# Patient Record
Sex: Female | Born: 1935 | Race: White | Hispanic: No | Smoking: Never smoker
Health system: Southern US, Community
[De-identification: ages and names within clinical notes are randomized; demographics above are authoritative.]

## PROBLEM LIST (undated history)

## (undated) DIAGNOSIS — Z8489 Family history of other specified conditions: Secondary | ICD-10-CM

## (undated) DIAGNOSIS — E7800 Pure hypercholesterolemia, unspecified: Secondary | ICD-10-CM

## (undated) DIAGNOSIS — Z8669 Personal history of other diseases of the nervous system and sense organs: Secondary | ICD-10-CM

## (undated) DIAGNOSIS — IMO0002 Reserved for concepts with insufficient information to code with codable children: Secondary | ICD-10-CM

## (undated) DIAGNOSIS — C801 Malignant (primary) neoplasm, unspecified: Secondary | ICD-10-CM

## (undated) DIAGNOSIS — C3490 Malignant neoplasm of unspecified part of unspecified bronchus or lung: Secondary | ICD-10-CM

## (undated) DIAGNOSIS — Z828 Family history of other disabilities and chronic diseases leading to disablement, not elsewhere classified: Secondary | ICD-10-CM

## (undated) DIAGNOSIS — J700 Acute pulmonary manifestations due to radiation: Secondary | ICD-10-CM

## (undated) DIAGNOSIS — I4891 Unspecified atrial fibrillation: Secondary | ICD-10-CM

## (undated) DIAGNOSIS — IMO0001 Reserved for inherently not codable concepts without codable children: Secondary | ICD-10-CM

## (undated) HISTORY — DX: Pure hypercholesterolemia, unspecified: E78.00

## (undated) HISTORY — DX: Reserved for concepts with insufficient information to code with codable children: IMO0002

## (undated) HISTORY — DX: Acute pulmonary manifestations due to radiation: J70.0

## (undated) HISTORY — DX: Personal history of other diseases of the nervous system and sense organs: Z86.69

## (undated) HISTORY — DX: Reserved for inherently not codable concepts without codable children: IMO0001

## (undated) HISTORY — DX: Malignant neoplasm of unspecified part of unspecified bronchus or lung: C34.90

---

## 1998-01-24 ENCOUNTER — Other Ambulatory Visit: Admission: RE | Admit: 1998-01-24 | Discharge: 1998-01-24 | Payer: Self-pay | Admitting: Gynecology

## 1998-09-02 ENCOUNTER — Emergency Department (HOSPITAL_COMMUNITY): Admission: EM | Admit: 1998-09-02 | Discharge: 1998-09-02 | Payer: Self-pay | Admitting: Emergency Medicine

## 1999-01-17 ENCOUNTER — Other Ambulatory Visit: Admission: RE | Admit: 1999-01-17 | Discharge: 1999-01-17 | Payer: Self-pay | Admitting: Gynecology

## 1999-07-05 ENCOUNTER — Encounter: Payer: Self-pay | Admitting: Gynecology

## 1999-07-05 ENCOUNTER — Encounter: Admission: RE | Admit: 1999-07-05 | Discharge: 1999-07-05 | Payer: Self-pay | Admitting: Gynecology

## 1999-08-15 DIAGNOSIS — C3490 Malignant neoplasm of unspecified part of unspecified bronchus or lung: Secondary | ICD-10-CM

## 1999-08-15 HISTORY — DX: Malignant neoplasm of unspecified part of unspecified bronchus or lung: C34.90

## 2000-01-08 ENCOUNTER — Other Ambulatory Visit: Admission: RE | Admit: 2000-01-08 | Discharge: 2000-01-08 | Payer: Self-pay | Admitting: Gynecology

## 2000-07-08 ENCOUNTER — Encounter: Admission: RE | Admit: 2000-07-08 | Discharge: 2000-07-08 | Payer: Self-pay | Admitting: Gynecology

## 2000-07-08 ENCOUNTER — Encounter: Payer: Self-pay | Admitting: Gynecology

## 2001-01-08 ENCOUNTER — Other Ambulatory Visit: Admission: RE | Admit: 2001-01-08 | Discharge: 2001-01-08 | Payer: Self-pay | Admitting: Gynecology

## 2001-07-06 ENCOUNTER — Encounter: Payer: Self-pay | Admitting: Gynecology

## 2001-07-06 ENCOUNTER — Encounter: Admission: RE | Admit: 2001-07-06 | Discharge: 2001-07-06 | Payer: Self-pay | Admitting: Gynecology

## 2002-07-07 ENCOUNTER — Ambulatory Visit (HOSPITAL_COMMUNITY): Admission: RE | Admit: 2002-07-07 | Discharge: 2002-07-07 | Payer: Self-pay | Admitting: Family Medicine

## 2002-07-07 ENCOUNTER — Encounter: Payer: Self-pay | Admitting: Family Medicine

## 2003-03-10 ENCOUNTER — Other Ambulatory Visit: Admission: RE | Admit: 2003-03-10 | Discharge: 2003-03-10 | Payer: Self-pay | Admitting: Family Medicine

## 2003-07-12 ENCOUNTER — Ambulatory Visit (HOSPITAL_COMMUNITY): Admission: RE | Admit: 2003-07-12 | Discharge: 2003-07-12 | Payer: Self-pay | Admitting: Family Medicine

## 2004-07-12 ENCOUNTER — Ambulatory Visit (HOSPITAL_COMMUNITY): Admission: RE | Admit: 2004-07-12 | Discharge: 2004-07-12 | Payer: Self-pay | Admitting: Family Medicine

## 2005-07-15 ENCOUNTER — Ambulatory Visit (HOSPITAL_COMMUNITY): Admission: RE | Admit: 2005-07-15 | Discharge: 2005-07-15 | Payer: Self-pay | Admitting: Family Medicine

## 2006-06-13 ENCOUNTER — Ambulatory Visit (HOSPITAL_COMMUNITY): Admission: RE | Admit: 2006-06-13 | Discharge: 2006-06-13 | Payer: Self-pay | Admitting: Thoracic Surgery

## 2006-06-19 ENCOUNTER — Inpatient Hospital Stay (HOSPITAL_COMMUNITY): Admission: RE | Admit: 2006-06-19 | Discharge: 2006-06-25 | Payer: Self-pay | Admitting: Thoracic Surgery

## 2006-06-19 ENCOUNTER — Encounter (INDEPENDENT_AMBULATORY_CARE_PROVIDER_SITE_OTHER): Payer: Self-pay | Admitting: Specialist

## 2006-06-19 HISTORY — PX: OTHER SURGICAL HISTORY: SHX169

## 2006-06-24 ENCOUNTER — Ambulatory Visit: Payer: Self-pay | Admitting: Internal Medicine

## 2006-07-02 ENCOUNTER — Encounter: Admission: RE | Admit: 2006-07-02 | Discharge: 2006-07-02 | Payer: Self-pay | Admitting: Thoracic Surgery

## 2006-07-08 LAB — COMPREHENSIVE METABOLIC PANEL
AST: 14 U/L (ref 0–37)
BUN: 17 mg/dL (ref 6–23)
CO2: 23 mEq/L (ref 19–32)
Calcium: 9.3 mg/dL (ref 8.4–10.5)
Chloride: 105 mEq/L (ref 96–112)
Creatinine, Ser: 0.72 mg/dL (ref 0.40–1.20)
Total Bilirubin: 0.3 mg/dL (ref 0.3–1.2)

## 2006-07-08 LAB — CBC WITH DIFFERENTIAL/PLATELET
Basophils Absolute: 0 10*3/uL (ref 0.0–0.1)
EOS%: 0.5 % (ref 0.0–7.0)
HCT: 37.4 % (ref 34.8–46.6)
HGB: 12.8 g/dL (ref 11.6–15.9)
LYMPH%: 21.8 % (ref 14.0–48.0)
MCH: 31.9 pg (ref 26.0–34.0)
NEUT%: 67.8 % (ref 39.6–76.8)
Platelets: 432 10*3/uL — ABNORMAL HIGH (ref 145–400)
lymph#: 1.7 10*3/uL (ref 0.9–3.3)

## 2006-07-16 ENCOUNTER — Ambulatory Visit: Payer: Self-pay | Admitting: Thoracic Surgery

## 2006-07-16 ENCOUNTER — Encounter: Admission: RE | Admit: 2006-07-16 | Discharge: 2006-07-16 | Payer: Self-pay | Admitting: Thoracic Surgery

## 2006-07-29 LAB — COMPREHENSIVE METABOLIC PANEL
ALT: 31 U/L (ref 0–35)
BUN: 13 mg/dL (ref 6–23)
CO2: 17 mEq/L — ABNORMAL LOW (ref 19–32)
Calcium: 9.2 mg/dL (ref 8.4–10.5)
Chloride: 105 mEq/L (ref 96–112)
Creatinine, Ser: 0.62 mg/dL (ref 0.40–1.20)
Glucose, Bld: 165 mg/dL — ABNORMAL HIGH (ref 70–99)

## 2006-07-29 LAB — CBC WITH DIFFERENTIAL/PLATELET
Basophils Absolute: 0 10*3/uL (ref 0.0–0.1)
HCT: 40 % (ref 34.8–46.6)
HGB: 13.7 g/dL (ref 11.6–15.9)
MONO#: 0.1 10*3/uL (ref 0.1–0.9)
NEUT%: 87.2 % — ABNORMAL HIGH (ref 39.6–76.8)
WBC: 7.4 10*3/uL (ref 3.9–10.0)
lymph#: 0.8 10*3/uL — ABNORMAL LOW (ref 0.9–3.3)

## 2006-08-06 LAB — CBC WITH DIFFERENTIAL/PLATELET
BASO%: 0.2 % (ref 0.0–2.0)
Basophils Absolute: 0 10*3/uL (ref 0.0–0.1)
EOS%: 0.1 % (ref 0.0–7.0)
Eosinophils Absolute: 0 10*3/uL (ref 0.0–0.5)
HCT: 36.5 % (ref 34.8–46.6)
HGB: 12.5 g/dL (ref 11.6–15.9)
LYMPH%: 21.8 % (ref 14.0–48.0)
MCH: 31.3 pg (ref 26.0–34.0)
MCHC: 34.2 g/dL (ref 32.0–36.0)
MCV: 91.5 fL (ref 81.0–101.0)
MONO#: 0.3 10*3/uL (ref 0.1–0.9)
MONO%: 2.8 % (ref 0.0–13.0)
NEUT#: 7.6 10*3/uL — ABNORMAL HIGH (ref 1.5–6.5)
NEUT%: 75.1 % (ref 39.6–76.8)
Platelets: 187 10*3/uL (ref 145–400)
RBC: 3.99 10*6/uL (ref 3.70–5.32)
RDW: 13.2 % (ref 11.3–14.5)
WBC: 10.1 10*3/uL — ABNORMAL HIGH (ref 3.9–10.0)
lymph#: 2.2 10*3/uL (ref 0.9–3.3)

## 2006-08-06 LAB — COMPREHENSIVE METABOLIC PANEL
ALT: 12 U/L (ref 0–35)
Alkaline Phosphatase: 81 U/L (ref 39–117)
CO2: 26 mEq/L (ref 19–32)
Creatinine, Ser: 0.7 mg/dL (ref 0.40–1.20)
Glucose, Bld: 107 mg/dL — ABNORMAL HIGH (ref 70–99)
Sodium: 140 mEq/L (ref 135–145)
Total Bilirubin: 0.3 mg/dL (ref 0.3–1.2)
Total Protein: 6.4 g/dL (ref 6.0–8.3)

## 2006-08-06 LAB — MAGNESIUM: Magnesium: 1.7 mg/dL (ref 1.5–2.5)

## 2006-08-12 ENCOUNTER — Ambulatory Visit: Payer: Self-pay | Admitting: Internal Medicine

## 2006-08-12 LAB — COMPREHENSIVE METABOLIC PANEL
BUN: 16 mg/dL (ref 6–23)
CO2: 26 mEq/L (ref 19–32)
Calcium: 8.6 mg/dL (ref 8.4–10.5)
Chloride: 106 mEq/L (ref 96–112)
Creatinine, Ser: 0.69 mg/dL (ref 0.40–1.20)
Glucose, Bld: 95 mg/dL (ref 70–99)
Total Bilirubin: 0.2 mg/dL — ABNORMAL LOW (ref 0.3–1.2)

## 2006-08-12 LAB — CBC WITH DIFFERENTIAL/PLATELET
Basophils Absolute: 0 10*3/uL (ref 0.0–0.1)
HCT: 36 % (ref 34.8–46.6)
HGB: 12.3 g/dL (ref 11.6–15.9)
LYMPH%: 21.2 % (ref 14.0–48.0)
MCHC: 34 g/dL (ref 32.0–36.0)
MONO#: 0.4 10*3/uL (ref 0.1–0.9)
NEUT%: 74.5 % (ref 39.6–76.8)
Platelets: 261 10*3/uL (ref 145–400)
WBC: 11.5 10*3/uL — ABNORMAL HIGH (ref 3.9–10.0)
lymph#: 2.4 10*3/uL (ref 0.9–3.3)

## 2006-08-12 LAB — MAGNESIUM: Magnesium: 1.8 mg/dL (ref 1.5–2.5)

## 2006-08-18 LAB — CBC WITH DIFFERENTIAL/PLATELET
BASO%: 0.5 % (ref 0.0–2.0)
Eosinophils Absolute: 0 10*3/uL (ref 0.0–0.5)
HCT: 36.7 % (ref 34.8–46.6)
LYMPH%: 35.1 % (ref 14.0–48.0)
MCHC: 34.7 g/dL (ref 32.0–36.0)
MCV: 91.6 fL (ref 81.0–101.0)
MONO%: 8.2 % (ref 0.0–13.0)
NEUT%: 55.9 % (ref 39.6–76.8)
Platelets: 379 10*3/uL (ref 145–400)
RBC: 4 10*6/uL (ref 3.70–5.32)

## 2006-08-18 LAB — COMPREHENSIVE METABOLIC PANEL
Alkaline Phosphatase: 72 U/L (ref 39–117)
Creatinine, Ser: 0.7 mg/dL (ref 0.40–1.20)
Glucose, Bld: 89 mg/dL (ref 70–99)
Sodium: 140 mEq/L (ref 135–145)
Total Bilirubin: 0.4 mg/dL (ref 0.3–1.2)
Total Protein: 6.7 g/dL (ref 6.0–8.3)

## 2006-08-26 LAB — COMPREHENSIVE METABOLIC PANEL
Alkaline Phosphatase: 80 U/L (ref 39–117)
BUN: 11 mg/dL (ref 6–23)
CO2: 27 mEq/L (ref 19–32)
Creatinine, Ser: 0.67 mg/dL (ref 0.40–1.20)
Glucose, Bld: 114 mg/dL — ABNORMAL HIGH (ref 70–99)
Sodium: 136 mEq/L (ref 135–145)
Total Bilirubin: 0.4 mg/dL (ref 0.3–1.2)
Total Protein: 6.5 g/dL (ref 6.0–8.3)

## 2006-08-26 LAB — CBC WITH DIFFERENTIAL/PLATELET
Basophils Absolute: 0 10*3/uL (ref 0.0–0.1)
Eosinophils Absolute: 0 10*3/uL (ref 0.0–0.5)
HCT: 35 % (ref 34.8–46.6)
LYMPH%: 30.6 % (ref 14.0–48.0)
MCV: 91.3 fL (ref 81.0–101.0)
MONO#: 0.7 10*3/uL (ref 0.1–0.9)
MONO%: 10.2 % (ref 0.0–13.0)
NEUT#: 3.7 10*3/uL (ref 1.5–6.5)
NEUT%: 58.4 % (ref 39.6–76.8)
Platelets: 147 10*3/uL (ref 145–400)
RBC: 3.83 10*6/uL (ref 3.70–5.32)
WBC: 6.4 10*3/uL (ref 3.9–10.0)

## 2006-08-26 LAB — MAGNESIUM: Magnesium: 1.6 mg/dL (ref 1.5–2.5)

## 2006-08-27 ENCOUNTER — Ambulatory Visit: Payer: Self-pay | Admitting: Thoracic Surgery

## 2006-08-27 ENCOUNTER — Encounter: Admission: RE | Admit: 2006-08-27 | Discharge: 2006-08-27 | Payer: Self-pay | Admitting: Thoracic Surgery

## 2006-09-02 LAB — CBC WITH DIFFERENTIAL/PLATELET
BASO%: 0.2 % (ref 0.0–2.0)
Eosinophils Absolute: 0 10*3/uL (ref 0.0–0.5)
HCT: 34.3 % — ABNORMAL LOW (ref 34.8–46.6)
HGB: 11.9 g/dL (ref 11.6–15.9)
LYMPH%: 18.1 % (ref 14.0–48.0)
MCHC: 34.8 g/dL (ref 32.0–36.0)
MONO#: 0.3 10*3/uL (ref 0.1–0.9)
NEUT#: 9.9 10*3/uL — ABNORMAL HIGH (ref 1.5–6.5)
NEUT%: 79.3 % — ABNORMAL HIGH (ref 39.6–76.8)
Platelets: 236 10*3/uL (ref 145–400)
WBC: 12.5 10*3/uL — ABNORMAL HIGH (ref 3.9–10.0)
lymph#: 2.3 10*3/uL (ref 0.9–3.3)

## 2006-09-02 LAB — COMPREHENSIVE METABOLIC PANEL
AST: 13 U/L (ref 0–37)
Alkaline Phosphatase: 81 U/L (ref 39–117)
BUN: 13 mg/dL (ref 6–23)
Creatinine, Ser: 0.7 mg/dL (ref 0.40–1.20)
Total Bilirubin: 0.3 mg/dL (ref 0.3–1.2)

## 2006-09-08 LAB — COMPREHENSIVE METABOLIC PANEL
Albumin: 3.9 g/dL (ref 3.5–5.2)
Alkaline Phosphatase: 72 U/L (ref 39–117)
BUN: 13 mg/dL (ref 6–23)
Glucose, Bld: 194 mg/dL — ABNORMAL HIGH (ref 70–99)
Potassium: 4 mEq/L (ref 3.5–5.3)

## 2006-09-08 LAB — CBC WITH DIFFERENTIAL/PLATELET
Basophils Absolute: 0 10*3/uL (ref 0.0–0.1)
Eosinophils Absolute: 0 10*3/uL (ref 0.0–0.5)
HGB: 11.9 g/dL (ref 11.6–15.9)
LYMPH%: 9.7 % — ABNORMAL LOW (ref 14.0–48.0)
MCV: 92.1 fL (ref 81.0–101.0)
MONO%: 0.4 % (ref 0.0–13.0)
NEUT#: 6.7 10*3/uL — ABNORMAL HIGH (ref 1.5–6.5)
Platelets: 333 10*3/uL (ref 145–400)
RDW: 16.1 % — ABNORMAL HIGH (ref 11.3–14.5)

## 2006-09-16 LAB — COMPREHENSIVE METABOLIC PANEL
AST: 10 U/L (ref 0–37)
Albumin: 3.5 g/dL (ref 3.5–5.2)
Alkaline Phosphatase: 70 U/L (ref 39–117)
BUN: 15 mg/dL (ref 6–23)
Creatinine, Ser: 0.76 mg/dL (ref 0.40–1.20)
Potassium: 3.7 mEq/L (ref 3.5–5.3)
Total Bilirubin: 0.6 mg/dL (ref 0.3–1.2)

## 2006-09-16 LAB — CBC WITH DIFFERENTIAL/PLATELET
Basophils Absolute: 0 10*3/uL (ref 0.0–0.1)
Eosinophils Absolute: 0 10*3/uL (ref 0.0–0.5)
HGB: 10.6 g/dL — ABNORMAL LOW (ref 11.6–15.9)
MCV: 91.9 fL (ref 81.0–101.0)
MONO#: 0.7 10*3/uL (ref 0.1–0.9)
MONO%: 14.4 % — ABNORMAL HIGH (ref 0.0–13.0)
NEUT#: 2.7 10*3/uL (ref 1.5–6.5)
RDW: 16.1 % — ABNORMAL HIGH (ref 11.3–14.5)
WBC: 4.7 10*3/uL (ref 3.9–10.0)
lymph#: 1.3 10*3/uL (ref 0.9–3.3)

## 2006-09-23 LAB — COMPREHENSIVE METABOLIC PANEL
Alkaline Phosphatase: 79 U/L (ref 39–117)
BUN: 12 mg/dL (ref 6–23)
CO2: 23 mEq/L (ref 19–32)
Glucose, Bld: 95 mg/dL (ref 70–99)
Total Bilirubin: 0.3 mg/dL (ref 0.3–1.2)

## 2006-09-23 LAB — CBC WITH DIFFERENTIAL/PLATELET
Basophils Absolute: 0.1 10*3/uL (ref 0.0–0.1)
Eosinophils Absolute: 0 10*3/uL (ref 0.0–0.5)
HCT: 33.1 % — ABNORMAL LOW (ref 34.8–46.6)
HGB: 11.6 g/dL (ref 11.6–15.9)
LYMPH%: 22.5 % (ref 14.0–48.0)
MCV: 93.5 fL (ref 81.0–101.0)
MONO%: 5.5 % (ref 0.0–13.0)
NEUT#: 7.4 10*3/uL — ABNORMAL HIGH (ref 1.5–6.5)
NEUT%: 71.2 % (ref 39.6–76.8)
Platelets: 334 10*3/uL (ref 145–400)

## 2006-09-23 LAB — MAGNESIUM: Magnesium: 1.7 mg/dL (ref 1.5–2.5)

## 2006-09-25 ENCOUNTER — Ambulatory Visit: Payer: Self-pay | Admitting: Internal Medicine

## 2006-09-29 LAB — CBC WITH DIFFERENTIAL/PLATELET
BASO%: 0.2 % (ref 0.0–2.0)
Eosinophils Absolute: 0 10*3/uL (ref 0.0–0.5)
HCT: 32.6 % — ABNORMAL LOW (ref 34.8–46.6)
LYMPH%: 10.8 % — ABNORMAL LOW (ref 14.0–48.0)
MONO#: 0.1 10*3/uL (ref 0.1–0.9)
NEUT#: 6.3 10*3/uL (ref 1.5–6.5)
NEUT%: 87.7 % — ABNORMAL HIGH (ref 39.6–76.8)
Platelets: 371 10*3/uL (ref 145–400)
RBC: 3.46 10*6/uL — ABNORMAL LOW (ref 3.70–5.32)
WBC: 7.2 10*3/uL (ref 3.9–10.0)
lymph#: 0.8 10*3/uL — ABNORMAL LOW (ref 0.9–3.3)

## 2006-09-29 LAB — COMPREHENSIVE METABOLIC PANEL
Alkaline Phosphatase: 73 U/L (ref 39–117)
CO2: 22 mEq/L (ref 19–32)
Creatinine, Ser: 0.75 mg/dL (ref 0.40–1.20)
Glucose, Bld: 126 mg/dL — ABNORMAL HIGH (ref 70–99)
Sodium: 140 mEq/L (ref 135–145)
Total Bilirubin: 0.3 mg/dL (ref 0.3–1.2)
Total Protein: 6.2 g/dL (ref 6.0–8.3)

## 2006-10-08 ENCOUNTER — Ambulatory Visit: Payer: Self-pay | Admitting: Thoracic Surgery

## 2006-10-08 ENCOUNTER — Encounter: Admission: RE | Admit: 2006-10-08 | Discharge: 2006-10-08 | Payer: Self-pay | Admitting: Thoracic Surgery

## 2006-10-28 LAB — CBC WITH DIFFERENTIAL/PLATELET
Basophils Absolute: 0 10*3/uL (ref 0.0–0.1)
Eosinophils Absolute: 0 10*3/uL (ref 0.0–0.5)
HCT: 33.2 % — ABNORMAL LOW (ref 34.8–46.6)
HGB: 11.6 g/dL (ref 11.6–15.9)
LYMPH%: 30 % (ref 14.0–48.0)
MCV: 95.6 fL (ref 81.0–101.0)
MONO#: 0.6 10*3/uL (ref 0.1–0.9)
MONO%: 11.2 % (ref 0.0–13.0)
NEUT#: 3.1 10*3/uL (ref 1.5–6.5)
NEUT%: 58 % (ref 39.6–76.8)
Platelets: 278 10*3/uL (ref 145–400)
WBC: 5.4 10*3/uL (ref 3.9–10.0)

## 2006-10-28 LAB — COMPREHENSIVE METABOLIC PANEL
Alkaline Phosphatase: 59 U/L (ref 39–117)
BUN: 18 mg/dL (ref 6–23)
CO2: 24 mEq/L (ref 19–32)
Creatinine, Ser: 0.8 mg/dL (ref 0.40–1.20)
Glucose, Bld: 99 mg/dL (ref 70–99)
Sodium: 142 mEq/L (ref 135–145)
Total Bilirubin: 0.5 mg/dL (ref 0.3–1.2)
Total Protein: 6.3 g/dL (ref 6.0–8.3)

## 2007-01-21 ENCOUNTER — Encounter: Admission: RE | Admit: 2007-01-21 | Discharge: 2007-01-21 | Payer: Self-pay | Admitting: Thoracic Surgery

## 2007-01-21 ENCOUNTER — Ambulatory Visit: Payer: Self-pay | Admitting: Thoracic Surgery

## 2007-04-16 ENCOUNTER — Ambulatory Visit: Payer: Self-pay | Admitting: Internal Medicine

## 2007-04-21 LAB — COMPREHENSIVE METABOLIC PANEL
Albumin: 4.2 g/dL (ref 3.5–5.2)
BUN: 21 mg/dL (ref 6–23)
CO2: 22 mEq/L (ref 19–32)
Glucose, Bld: 94 mg/dL (ref 70–99)
Potassium: 4 mEq/L (ref 3.5–5.3)
Sodium: 143 mEq/L (ref 135–145)
Total Bilirubin: 0.4 mg/dL (ref 0.3–1.2)
Total Protein: 6.8 g/dL (ref 6.0–8.3)

## 2007-04-21 LAB — CBC WITH DIFFERENTIAL/PLATELET
Basophils Absolute: 0 10*3/uL (ref 0.0–0.1)
Eosinophils Absolute: 0 10*3/uL (ref 0.0–0.5)
HGB: 13.1 g/dL (ref 11.6–15.9)
LYMPH%: 42.3 % (ref 14.0–48.0)
MONO#: 0.3 10*3/uL (ref 0.1–0.9)
NEUT#: 2.3 10*3/uL (ref 1.5–6.5)
Platelets: 241 10*3/uL (ref 145–400)
RBC: 4.01 10*6/uL (ref 3.70–5.32)
WBC: 4.6 10*3/uL (ref 3.9–10.0)

## 2007-04-23 ENCOUNTER — Ambulatory Visit: Admission: RE | Admit: 2007-04-23 | Discharge: 2007-04-23 | Payer: Self-pay | Admitting: Internal Medicine

## 2007-05-13 ENCOUNTER — Ambulatory Visit: Payer: Self-pay | Admitting: Thoracic Surgery

## 2007-10-19 ENCOUNTER — Ambulatory Visit: Payer: Self-pay | Admitting: Internal Medicine

## 2007-10-21 ENCOUNTER — Ambulatory Visit (HOSPITAL_COMMUNITY): Admission: RE | Admit: 2007-10-21 | Discharge: 2007-10-21 | Payer: Self-pay | Admitting: Internal Medicine

## 2007-10-21 LAB — CBC WITH DIFFERENTIAL/PLATELET
BASO%: 0.4 % (ref 0.0–2.0)
Basophils Absolute: 0 10*3/uL (ref 0.0–0.1)
Eosinophils Absolute: 0.1 10*3/uL (ref 0.0–0.5)
HCT: 37.9 % (ref 34.8–46.6)
HGB: 13.2 g/dL (ref 11.6–15.9)
LYMPH%: 39.7 % (ref 14.0–48.0)
MCHC: 34.8 g/dL (ref 32.0–36.0)
MONO#: 0.4 10*3/uL (ref 0.1–0.9)
NEUT%: 50.2 % (ref 39.6–76.8)
Platelets: 259 10*3/uL (ref 145–400)
WBC: 5.3 10*3/uL (ref 3.9–10.0)
lymph#: 2.1 10*3/uL (ref 0.9–3.3)

## 2007-10-21 LAB — COMPREHENSIVE METABOLIC PANEL
ALT: 17 U/L (ref 0–35)
BUN: 19 mg/dL (ref 6–23)
CO2: 25 mEq/L (ref 19–32)
Calcium: 9 mg/dL (ref 8.4–10.5)
Chloride: 105 mEq/L (ref 96–112)
Creatinine, Ser: 0.74 mg/dL (ref 0.40–1.20)
Glucose, Bld: 91 mg/dL (ref 70–99)
Total Bilirubin: 0.4 mg/dL (ref 0.3–1.2)

## 2007-10-27 ENCOUNTER — Ambulatory Visit: Payer: Self-pay | Admitting: Thoracic Surgery

## 2008-04-19 ENCOUNTER — Ambulatory Visit: Payer: Self-pay | Admitting: Internal Medicine

## 2008-04-21 ENCOUNTER — Ambulatory Visit (HOSPITAL_COMMUNITY): Admission: RE | Admit: 2008-04-21 | Discharge: 2008-04-21 | Payer: Self-pay | Admitting: Internal Medicine

## 2008-04-21 LAB — CBC WITH DIFFERENTIAL/PLATELET
BASO%: 0.3 % (ref 0.0–2.0)
Basophils Absolute: 0 10*3/uL (ref 0.0–0.1)
EOS%: 0.4 % (ref 0.0–7.0)
HCT: 40.6 % (ref 34.8–46.6)
HGB: 13.8 g/dL (ref 11.6–15.9)
LYMPH%: 35.3 % (ref 14.0–48.0)
MCH: 32.4 pg (ref 26.0–34.0)
MCHC: 34 g/dL (ref 32.0–36.0)
MONO#: 0.3 10*3/uL (ref 0.1–0.9)
NEUT%: 58.1 % (ref 39.6–76.8)
Platelets: 228 10*3/uL (ref 145–400)

## 2008-04-21 LAB — COMPREHENSIVE METABOLIC PANEL
ALT: 13 U/L (ref 0–35)
BUN: 14 mg/dL (ref 6–23)
CO2: 25 mEq/L (ref 19–32)
Calcium: 9.5 mg/dL (ref 8.4–10.5)
Creatinine, Ser: 0.85 mg/dL (ref 0.40–1.20)
Total Bilirubin: 1 mg/dL (ref 0.3–1.2)

## 2008-05-04 ENCOUNTER — Ambulatory Visit: Payer: Self-pay | Admitting: Thoracic Surgery

## 2008-07-18 ENCOUNTER — Ambulatory Visit: Payer: Self-pay | Admitting: Internal Medicine

## 2008-07-20 ENCOUNTER — Ambulatory Visit (HOSPITAL_COMMUNITY): Admission: RE | Admit: 2008-07-20 | Discharge: 2008-07-20 | Payer: Self-pay | Admitting: Internal Medicine

## 2008-07-20 LAB — COMPREHENSIVE METABOLIC PANEL
ALT: 12 U/L (ref 0–35)
AST: 21 U/L (ref 0–37)
Creatinine, Ser: 0.84 mg/dL (ref 0.40–1.20)
Total Bilirubin: 0.8 mg/dL (ref 0.3–1.2)

## 2008-07-20 LAB — CBC WITH DIFFERENTIAL/PLATELET
BASO%: 0.5 % (ref 0.0–2.0)
Basophils Absolute: 0 10*3/uL (ref 0.0–0.1)
Eosinophils Absolute: 0 10*3/uL (ref 0.0–0.5)
HCT: 39.4 % (ref 34.8–46.6)
HGB: 13.6 g/dL (ref 11.6–15.9)
MCHC: 34.6 g/dL (ref 32.0–36.0)
MONO#: 0.3 10*3/uL (ref 0.1–0.9)
NEUT#: 2.1 10*3/uL (ref 1.5–6.5)
NEUT%: 49.5 % (ref 39.6–76.8)
Platelets: 213 10*3/uL (ref 145–400)
WBC: 4.2 10*3/uL (ref 3.9–10.0)
lymph#: 1.8 10*3/uL (ref 0.9–3.3)

## 2008-07-27 ENCOUNTER — Ambulatory Visit: Payer: Self-pay | Admitting: Thoracic Surgery

## 2009-01-18 ENCOUNTER — Ambulatory Visit: Payer: Self-pay | Admitting: Internal Medicine

## 2009-01-20 ENCOUNTER — Ambulatory Visit (HOSPITAL_COMMUNITY): Admission: RE | Admit: 2009-01-20 | Discharge: 2009-01-20 | Payer: Self-pay | Admitting: Internal Medicine

## 2009-01-20 LAB — COMPREHENSIVE METABOLIC PANEL
BUN: 14 mg/dL (ref 6–23)
CO2: 25 mEq/L (ref 19–32)
Calcium: 9.1 mg/dL (ref 8.4–10.5)
Chloride: 108 mEq/L (ref 96–112)
Creatinine, Ser: 0.68 mg/dL (ref 0.40–1.20)
Glucose, Bld: 76 mg/dL (ref 70–99)
Total Bilirubin: 0.7 mg/dL (ref 0.3–1.2)

## 2009-01-20 LAB — CBC WITH DIFFERENTIAL/PLATELET
Basophils Absolute: 0 10*3/uL (ref 0.0–0.1)
HCT: 37.1 % (ref 34.8–46.6)
HGB: 12.9 g/dL (ref 11.6–15.9)
LYMPH%: 48.9 % (ref 14.0–49.7)
MCHC: 34.7 g/dL (ref 31.5–36.0)
MONO#: 0.4 10*3/uL (ref 0.1–0.9)
NEUT%: 39.9 % (ref 38.4–76.8)
Platelets: 192 10*3/uL (ref 145–400)
WBC: 4 10*3/uL (ref 3.9–10.3)
lymph#: 1.9 10*3/uL (ref 0.9–3.3)

## 2009-01-27 ENCOUNTER — Ambulatory Visit (HOSPITAL_COMMUNITY): Admission: RE | Admit: 2009-01-27 | Discharge: 2009-01-27 | Payer: Self-pay | Admitting: Internal Medicine

## 2009-01-31 ENCOUNTER — Ambulatory Visit: Payer: Self-pay | Admitting: Thoracic Surgery

## 2009-06-03 HISTORY — PX: OTHER SURGICAL HISTORY: SHX169

## 2009-07-25 ENCOUNTER — Ambulatory Visit: Payer: Self-pay | Admitting: Internal Medicine

## 2009-07-27 ENCOUNTER — Ambulatory Visit (HOSPITAL_COMMUNITY): Admission: RE | Admit: 2009-07-27 | Discharge: 2009-07-27 | Payer: Self-pay | Admitting: Internal Medicine

## 2009-07-27 LAB — COMPREHENSIVE METABOLIC PANEL
ALT: 16 U/L (ref 0–35)
Albumin: 3.6 g/dL (ref 3.5–5.2)
Alkaline Phosphatase: 63 U/L (ref 39–117)
CO2: 28 mEq/L (ref 19–32)
Glucose, Bld: 89 mg/dL (ref 70–99)
Potassium: 4.8 mEq/L (ref 3.5–5.3)
Sodium: 140 mEq/L (ref 135–145)
Total Bilirubin: 0.7 mg/dL (ref 0.3–1.2)
Total Protein: 6.8 g/dL (ref 6.0–8.3)

## 2009-07-27 LAB — CBC WITH DIFFERENTIAL/PLATELET
BASO%: 0.4 % (ref 0.0–2.0)
Eosinophils Absolute: 0.1 10*3/uL (ref 0.0–0.5)
MCHC: 34 g/dL (ref 31.5–36.0)
MONO#: 0.4 10*3/uL (ref 0.1–0.9)
MONO%: 7.8 % (ref 0.0–14.0)
NEUT#: 3.1 10*3/uL (ref 1.5–6.5)
RBC: 4.13 10*6/uL (ref 3.70–5.45)
RDW: 13.1 % (ref 11.2–14.5)
WBC: 5.5 10*3/uL (ref 3.9–10.3)

## 2009-08-02 ENCOUNTER — Ambulatory Visit: Payer: Self-pay | Admitting: Thoracic Surgery

## 2009-08-14 ENCOUNTER — Ambulatory Visit (HOSPITAL_COMMUNITY): Admission: RE | Admit: 2009-08-14 | Discharge: 2009-08-14 | Payer: Self-pay | Admitting: Thoracic Surgery

## 2009-08-25 ENCOUNTER — Ambulatory Visit: Payer: Self-pay | Admitting: Internal Medicine

## 2009-08-29 LAB — COMPREHENSIVE METABOLIC PANEL
ALT: 10 U/L (ref 0–35)
AST: 18 U/L (ref 0–37)
Albumin: 4.3 g/dL (ref 3.5–5.2)
BUN: 16 mg/dL (ref 6–23)
Calcium: 9.3 mg/dL (ref 8.4–10.5)
Chloride: 102 mEq/L (ref 96–112)
Potassium: 4.3 mEq/L (ref 3.5–5.3)
Sodium: 134 mEq/L — ABNORMAL LOW (ref 135–145)
Total Protein: 7.1 g/dL (ref 6.0–8.3)

## 2009-08-29 LAB — CBC WITH DIFFERENTIAL/PLATELET
BASO%: 0.1 % (ref 0.0–2.0)
EOS%: 0 % (ref 0.0–7.0)
MCH: 31.5 pg (ref 25.1–34.0)
MCHC: 34.2 g/dL (ref 31.5–36.0)
MONO%: 3 % (ref 0.0–14.0)
RBC: 4.19 10*6/uL (ref 3.70–5.45)
RDW: 13.4 % (ref 11.2–14.5)
lymph#: 0.8 10*3/uL — ABNORMAL LOW (ref 0.9–3.3)

## 2009-09-04 LAB — CBC WITH DIFFERENTIAL/PLATELET
Basophils Absolute: 0 10*3/uL (ref 0.0–0.1)
EOS%: 2 % (ref 0.0–7.0)
HGB: 13.1 g/dL (ref 11.6–15.9)
MCH: 32.8 pg (ref 25.1–34.0)
NEUT#: 1.9 10*3/uL (ref 1.5–6.5)
RDW: 13.6 % (ref 11.2–14.5)
lymph#: 2.3 10*3/uL (ref 0.9–3.3)

## 2009-09-04 LAB — COMPREHENSIVE METABOLIC PANEL
ALT: 14 U/L (ref 0–35)
AST: 17 U/L (ref 0–37)
Albumin: 4.1 g/dL (ref 3.5–5.2)
BUN: 25 mg/dL — ABNORMAL HIGH (ref 6–23)
Calcium: 9 mg/dL (ref 8.4–10.5)
Chloride: 103 mEq/L (ref 96–112)
Potassium: 4.1 mEq/L (ref 3.5–5.3)
Total Protein: 6.6 g/dL (ref 6.0–8.3)

## 2009-09-11 LAB — CBC WITH DIFFERENTIAL/PLATELET
BASO%: 0.3 % (ref 0.0–2.0)
EOS%: 0.7 % (ref 0.0–7.0)
HGB: 12.7 g/dL (ref 11.6–15.9)
MCH: 32.5 pg (ref 25.1–34.0)
MCHC: 33.7 g/dL (ref 31.5–36.0)
RBC: 3.92 10*6/uL (ref 3.70–5.45)
RDW: 13.8 % (ref 11.2–14.5)
lymph#: 1.7 10*3/uL (ref 0.9–3.3)

## 2009-09-11 LAB — COMPREHENSIVE METABOLIC PANEL
ALT: 17 U/L (ref 0–35)
AST: 21 U/L (ref 0–37)
Albumin: 4.1 g/dL (ref 3.5–5.2)
Alkaline Phosphatase: 64 U/L (ref 39–117)
Calcium: 9.1 mg/dL (ref 8.4–10.5)
Chloride: 105 mEq/L (ref 96–112)
Potassium: 4.6 mEq/L (ref 3.5–5.3)
Sodium: 138 mEq/L (ref 135–145)

## 2009-09-18 LAB — CBC WITH DIFFERENTIAL/PLATELET
BASO%: 0 % (ref 0.0–2.0)
EOS%: 0 % (ref 0.0–7.0)
MCH: 31.7 pg (ref 25.1–34.0)
MCHC: 33.8 g/dL (ref 31.5–36.0)
MCV: 93.7 fL (ref 79.5–101.0)
MONO%: 12.2 % (ref 0.0–14.0)
NEUT%: 70.5 % (ref 38.4–76.8)
RDW: 14 % (ref 11.2–14.5)
lymph#: 1 10*3/uL (ref 0.9–3.3)

## 2009-09-18 LAB — COMPREHENSIVE METABOLIC PANEL
ALT: 15 U/L (ref 0–35)
AST: 19 U/L (ref 0–37)
Alkaline Phosphatase: 63 U/L (ref 39–117)
BUN: 22 mg/dL (ref 6–23)
Calcium: 8.5 mg/dL (ref 8.4–10.5)
Chloride: 105 mEq/L (ref 96–112)
Creatinine, Ser: 0.69 mg/dL (ref 0.40–1.20)
Potassium: 4.3 mEq/L (ref 3.5–5.3)

## 2009-09-25 ENCOUNTER — Ambulatory Visit: Payer: Self-pay | Admitting: Internal Medicine

## 2009-09-25 LAB — CBC WITH DIFFERENTIAL/PLATELET
BASO%: 0.3 % (ref 0.0–2.0)
Basophils Absolute: 0 10*3/uL (ref 0.0–0.1)
EOS%: 1.3 % (ref 0.0–7.0)
HCT: 35.7 % (ref 34.8–46.6)
HGB: 12.4 g/dL (ref 11.6–15.9)
LYMPH%: 65 % — ABNORMAL HIGH (ref 14.0–49.7)
MCH: 33.3 pg (ref 25.1–34.0)
MCHC: 34.7 g/dL (ref 31.5–36.0)
MCV: 96.2 fL (ref 79.5–101.0)
MONO%: 8.2 % (ref 0.0–14.0)
NEUT%: 25.2 % — ABNORMAL LOW (ref 38.4–76.8)
Platelets: 189 10*3/uL (ref 145–400)
lymph#: 1.8 10*3/uL (ref 0.9–3.3)

## 2009-09-25 LAB — COMPREHENSIVE METABOLIC PANEL
Albumin: 3.8 g/dL (ref 3.5–5.2)
Alkaline Phosphatase: 72 U/L (ref 39–117)
BUN: 22 mg/dL (ref 6–23)
Glucose, Bld: 105 mg/dL — ABNORMAL HIGH (ref 70–99)
Potassium: 4.1 mEq/L (ref 3.5–5.3)

## 2009-10-02 LAB — COMPREHENSIVE METABOLIC PANEL
ALT: 12 U/L (ref 0–35)
AST: 16 U/L (ref 0–37)
CO2: 25 mEq/L (ref 19–32)
Chloride: 107 mEq/L (ref 96–112)
Creatinine, Ser: 0.75 mg/dL (ref 0.40–1.20)
Glucose, Bld: 97 mg/dL (ref 70–99)
Sodium: 141 mEq/L (ref 135–145)
Total Bilirubin: 0.4 mg/dL (ref 0.3–1.2)
Total Protein: 6.1 g/dL (ref 6.0–8.3)

## 2009-10-02 LAB — CBC WITH DIFFERENTIAL/PLATELET
EOS%: 0.4 % (ref 0.0–7.0)
Eosinophils Absolute: 0 10*3/uL (ref 0.0–0.5)
HGB: 11.6 g/dL (ref 11.6–15.9)
MCH: 33.2 pg (ref 25.1–34.0)
MCV: 96.3 fL (ref 79.5–101.0)
NEUT#: 2.2 10*3/uL (ref 1.5–6.5)
NEUT%: 54.6 % (ref 38.4–76.8)
RDW: 15.1 % — ABNORMAL HIGH (ref 11.2–14.5)

## 2009-10-09 LAB — CBC WITH DIFFERENTIAL/PLATELET
BASO%: 0.2 % (ref 0.0–2.0)
HCT: 37.1 % (ref 34.8–46.6)
MCHC: 34.2 g/dL (ref 31.5–36.0)
MONO#: 0.3 10*3/uL (ref 0.1–0.9)
NEUT#: 4 10*3/uL (ref 1.5–6.5)
NEUT%: 76.9 % — ABNORMAL HIGH (ref 38.4–76.8)
RBC: 3.95 10*6/uL (ref 3.70–5.45)
WBC: 5.3 10*3/uL (ref 3.9–10.3)
lymph#: 0.9 10*3/uL (ref 0.9–3.3)
nRBC: 0 % (ref 0–0)

## 2009-10-09 LAB — COMPREHENSIVE METABOLIC PANEL
ALT: 15 U/L (ref 0–35)
CO2: 23 mEq/L (ref 19–32)
Calcium: 8.5 mg/dL (ref 8.4–10.5)
Chloride: 106 mEq/L (ref 96–112)
Creatinine, Ser: 0.9 mg/dL (ref 0.40–1.20)
Glucose, Bld: 226 mg/dL — ABNORMAL HIGH (ref 70–99)

## 2009-10-27 ENCOUNTER — Ambulatory Visit (HOSPITAL_COMMUNITY): Admission: RE | Admit: 2009-10-27 | Discharge: 2009-10-27 | Payer: Self-pay | Admitting: Internal Medicine

## 2009-10-27 ENCOUNTER — Ambulatory Visit: Payer: Self-pay | Admitting: Internal Medicine

## 2009-10-31 LAB — COMPREHENSIVE METABOLIC PANEL
ALT: 12 U/L (ref 0–35)
AST: 21 U/L (ref 0–37)
Albumin: 4.3 g/dL (ref 3.5–5.2)
Calcium: 9.4 mg/dL (ref 8.4–10.5)
Chloride: 104 mEq/L (ref 96–112)
Potassium: 4 mEq/L (ref 3.5–5.3)

## 2009-10-31 LAB — CBC WITH DIFFERENTIAL/PLATELET
BASO%: 0 % (ref 0.0–2.0)
EOS%: 0 % (ref 0.0–7.0)
MCH: 32.8 pg (ref 25.1–34.0)
MCHC: 34.1 g/dL (ref 31.5–36.0)
RBC: 3.75 10*6/uL (ref 3.70–5.45)
RDW: 16.7 % — ABNORMAL HIGH (ref 11.2–14.5)
lymph#: 1 10*3/uL (ref 0.9–3.3)

## 2009-11-07 LAB — CBC WITH DIFFERENTIAL/PLATELET
EOS%: 0.4 % (ref 0.0–7.0)
Eosinophils Absolute: 0 10*3/uL (ref 0.0–0.5)
HCT: 33.4 % — ABNORMAL LOW (ref 34.8–46.6)
HGB: 11.7 g/dL (ref 11.6–15.9)
LYMPH%: 60.7 % — ABNORMAL HIGH (ref 14.0–49.7)
MCV: 99 fL (ref 79.5–101.0)
MONO%: 8.9 % (ref 0.0–14.0)
NEUT#: 1 10*3/uL — ABNORMAL LOW (ref 1.5–6.5)
WBC: 3.5 10*3/uL — ABNORMAL LOW (ref 3.9–10.3)
lymph#: 2.1 10*3/uL (ref 0.9–3.3)

## 2009-11-07 LAB — COMPREHENSIVE METABOLIC PANEL
ALT: 9 U/L (ref 0–35)
BUN: 19 mg/dL (ref 6–23)
CO2: 26 mEq/L (ref 19–32)
Creatinine, Ser: 0.65 mg/dL (ref 0.40–1.20)
Total Protein: 6.4 g/dL (ref 6.0–8.3)

## 2009-11-14 LAB — CBC WITH DIFFERENTIAL/PLATELET
Basophils Absolute: 0 10*3/uL (ref 0.0–0.1)
EOS%: 0.2 % (ref 0.0–7.0)
Eosinophils Absolute: 0 10*3/uL (ref 0.0–0.5)
HCT: 32.4 % — ABNORMAL LOW (ref 34.8–46.6)
LYMPH%: 42 % (ref 14.0–49.7)
MCHC: 34.8 g/dL (ref 31.5–36.0)
MCV: 99.8 fL (ref 79.5–101.0)
NEUT#: 1.8 10*3/uL (ref 1.5–6.5)
NEUT%: 46.5 % (ref 38.4–76.8)
Platelets: 156 10*3/uL (ref 145–400)
lymph#: 1.6 10*3/uL (ref 0.9–3.3)

## 2009-11-14 LAB — COMPREHENSIVE METABOLIC PANEL
ALT: 12 U/L (ref 0–35)
Alkaline Phosphatase: 66 U/L (ref 39–117)
Creatinine, Ser: 0.73 mg/dL (ref 0.40–1.20)
Potassium: 3.7 mEq/L (ref 3.5–5.3)
Sodium: 142 mEq/L (ref 135–145)

## 2009-11-21 LAB — CBC WITH DIFFERENTIAL/PLATELET
BASO%: 0 % (ref 0.0–2.0)
Basophils Absolute: 0 10*3/uL (ref 0.0–0.1)
EOS%: 0 % (ref 0.0–7.0)
Eosinophils Absolute: 0 10*3/uL (ref 0.0–0.5)
HCT: 35.7 % (ref 34.8–46.6)
HGB: 11.9 g/dL (ref 11.6–15.9)
LYMPH%: 21.6 % (ref 14.0–49.7)
MCH: 33.4 pg (ref 25.1–34.0)
MCHC: 33.3 g/dL (ref 31.5–36.0)
MCV: 100.3 fL (ref 79.5–101.0)
MONO#: 0.5 10*3/uL (ref 0.1–0.9)
MONO%: 11 % (ref 0.0–14.0)
NEUT#: 3 10*3/uL (ref 1.5–6.5)
NEUT%: 67.4 % (ref 38.4–76.8)
Platelets: 306 10*3/uL (ref 145–400)
RBC: 3.56 10*6/uL — ABNORMAL LOW (ref 3.70–5.45)
RDW: 17.3 % — ABNORMAL HIGH (ref 11.2–14.5)
WBC: 4.5 10*3/uL (ref 3.9–10.3)
lymph#: 1 10*3/uL (ref 0.9–3.3)
nRBC: 0 % (ref 0–0)

## 2009-11-21 LAB — COMPREHENSIVE METABOLIC PANEL
CO2: 19 mEq/L (ref 19–32)
Chloride: 105 mEq/L (ref 96–112)
Creatinine, Ser: 0.76 mg/dL (ref 0.40–1.20)
Potassium: 4.2 mEq/L (ref 3.5–5.3)
Total Protein: 6.7 g/dL (ref 6.0–8.3)

## 2009-12-08 ENCOUNTER — Ambulatory Visit: Payer: Self-pay | Admitting: Internal Medicine

## 2009-12-12 LAB — COMPREHENSIVE METABOLIC PANEL
ALT: <8 U/L (ref 0–35)
AST: 14 U/L (ref 0–37)
Albumin: 3.8 g/dL (ref 3.5–5.2)
Alkaline Phosphatase: 64 U/L (ref 39–117)
BUN: 15 mg/dL (ref 6–23)
CO2: 19 mEq/L (ref 19–32)
Calcium: 9.1 mg/dL (ref 8.4–10.5)
Chloride: 104 mEq/L (ref 96–112)
Creatinine, Ser: 0.74 mg/dL (ref 0.40–1.20)
Glucose, Bld: 168 mg/dL — ABNORMAL HIGH (ref 70–99)
Potassium: 4.6 mEq/L (ref 3.5–5.3)
Sodium: 137 mEq/L (ref 135–145)
Total Bilirubin: 0.4 mg/dL (ref 0.3–1.2)
Total Protein: 6.4 g/dL (ref 6.0–8.3)

## 2009-12-12 LAB — CBC WITH DIFFERENTIAL/PLATELET
BASO%: 0.2 % (ref 0.0–2.0)
Basophils Absolute: 0 10*3/uL (ref 0.0–0.1)
EOS%: 0.1 % (ref 0.0–7.0)
Eosinophils Absolute: 0 10*3/uL (ref 0.0–0.5)
HCT: 32.3 % — ABNORMAL LOW (ref 34.8–46.6)
HGB: 11.3 g/dL — ABNORMAL LOW (ref 11.6–15.9)
LYMPH%: 12.7 % — ABNORMAL LOW (ref 14.0–49.7)
MCH: 36 pg — ABNORMAL HIGH (ref 25.1–34.0)
MCHC: 35 g/dL (ref 31.5–36.0)
MCV: 103 fL — ABNORMAL HIGH (ref 79.5–101.0)
MONO#: 0.4 10*3/uL (ref 0.1–0.9)
MONO%: 8.2 % (ref 0.0–14.0)
NEUT#: 4 10*3/uL (ref 1.5–6.5)
NEUT%: 78.8 % — ABNORMAL HIGH (ref 38.4–76.8)
Platelets: 280 10*3/uL (ref 145–400)
RBC: 3.14 10*6/uL — ABNORMAL LOW (ref 3.70–5.45)
RDW: 18.1 % — ABNORMAL HIGH (ref 11.2–14.5)
WBC: 5.1 10*3/uL (ref 3.9–10.3)
lymph#: 0.6 10*3/uL — ABNORMAL LOW (ref 0.9–3.3)

## 2009-12-18 LAB — CBC WITH DIFFERENTIAL/PLATELET
BASO%: 0.4 % (ref 0.0–2.0)
HCT: 34 % — ABNORMAL LOW (ref 34.8–46.6)
HGB: 11.7 g/dL (ref 11.6–15.9)
MONO#: 0.3 10*3/uL (ref 0.1–0.9)
MONO%: 8.7 % (ref 0.0–14.0)
NEUT#: 0.8 10*3/uL — ABNORMAL LOW (ref 1.5–6.5)
NEUT%: 27.1 % — ABNORMAL LOW (ref 38.4–76.8)
Platelets: 301 10*3/uL (ref 145–400)
lymph#: 1.9 10*3/uL (ref 0.9–3.3)

## 2009-12-18 LAB — COMPREHENSIVE METABOLIC PANEL
ALT: 12 U/L (ref 0–35)
AST: 18 U/L (ref 0–37)
Albumin: 4 g/dL (ref 3.5–5.2)
Alkaline Phosphatase: 59 U/L (ref 39–117)
CO2: 24 mEq/L (ref 19–32)
Creatinine, Ser: 0.73 mg/dL (ref 0.40–1.20)
Glucose, Bld: 92 mg/dL (ref 70–99)
Sodium: 140 mEq/L (ref 135–145)
Total Protein: 6.3 g/dL (ref 6.0–8.3)

## 2009-12-29 ENCOUNTER — Ambulatory Visit (HOSPITAL_COMMUNITY): Admission: RE | Admit: 2009-12-29 | Discharge: 2009-12-29 | Payer: Self-pay | Admitting: Internal Medicine

## 2010-01-02 LAB — COMPREHENSIVE METABOLIC PANEL
Albumin: 4.2 g/dL (ref 3.5–5.2)
Alkaline Phosphatase: 65 U/L (ref 39–117)
CO2: 23 mEq/L (ref 19–32)
Calcium: 9 mg/dL (ref 8.4–10.5)
Sodium: 142 mEq/L (ref 135–145)
Total Bilirubin: 0.4 mg/dL (ref 0.3–1.2)
Total Protein: 6.4 g/dL (ref 6.0–8.3)

## 2010-01-02 LAB — CBC WITH DIFFERENTIAL/PLATELET
BASO%: 0.3 % (ref 0.0–2.0)
Basophils Absolute: 0 10*3/uL (ref 0.0–0.1)
EOS%: 0.3 % (ref 0.0–7.0)
Eosinophils Absolute: 0 10*3/uL (ref 0.0–0.5)
HCT: 33 % — ABNORMAL LOW (ref 34.8–46.6)
HGB: 11.5 g/dL — ABNORMAL LOW (ref 11.6–15.9)
LYMPH%: 42.5 % (ref 14.0–49.7)
MCH: 36.8 pg — ABNORMAL HIGH (ref 25.1–34.0)
MCHC: 34.8 g/dL (ref 31.5–36.0)
MCV: 106 fL — ABNORMAL HIGH (ref 79.5–101.0)
MONO#: 0.3 10*3/uL (ref 0.1–0.9)
MONO%: 9.4 % (ref 0.0–14.0)
NEUT#: 1.5 10*3/uL (ref 1.5–6.5)
NEUT%: 47.5 % (ref 38.4–76.8)
Platelets: 287 10*3/uL (ref 145–400)
RBC: 3.12 10*6/uL — ABNORMAL LOW (ref 3.70–5.45)
RDW: 16.9 % — ABNORMAL HIGH (ref 11.2–14.5)
WBC: 3.2 10*3/uL — ABNORMAL LOW (ref 3.9–10.3)
lymph#: 1.4 10*3/uL (ref 0.9–3.3)

## 2010-01-19 ENCOUNTER — Ambulatory Visit: Payer: Self-pay | Admitting: Internal Medicine

## 2010-01-23 LAB — CBC WITH DIFFERENTIAL/PLATELET
Basophils Absolute: 0 10*3/uL (ref 0.0–0.1)
EOS%: 0 % (ref 0.0–7.0)
HCT: 36.4 % (ref 34.8–46.6)
LYMPH%: 13 % — ABNORMAL LOW (ref 14.0–49.7)
MCHC: 33.8 g/dL (ref 31.5–36.0)
MCV: 102 fL — ABNORMAL HIGH (ref 79.5–101.0)
MONO#: 0.8 10*3/uL (ref 0.1–0.9)
MONO%: 11.3 % (ref 0.0–14.0)
NEUT#: 5.4 10*3/uL (ref 1.5–6.5)
NEUT%: 75.7 % (ref 38.4–76.8)
WBC: 7.1 10*3/uL (ref 3.9–10.3)
lymph#: 0.9 10*3/uL (ref 0.9–3.3)

## 2010-01-23 LAB — COMPREHENSIVE METABOLIC PANEL
ALT: 12 U/L (ref 0–35)
AST: 18 U/L (ref 0–37)
Albumin: 4.3 g/dL (ref 3.5–5.2)
Creatinine, Ser: 0.71 mg/dL (ref 0.40–1.20)
Sodium: 137 mEq/L (ref 135–145)
Total Bilirubin: 0.4 mg/dL (ref 0.3–1.2)
Total Protein: 6.9 g/dL (ref 6.0–8.3)

## 2010-02-13 LAB — COMPREHENSIVE METABOLIC PANEL
ALT: 11 U/L (ref 0–35)
AST: 15 U/L (ref 0–37)
Alkaline Phosphatase: 59 U/L (ref 39–117)
Calcium: 9.3 mg/dL (ref 8.4–10.5)
Chloride: 103 mEq/L (ref 96–112)
Glucose, Bld: 170 mg/dL — ABNORMAL HIGH (ref 70–99)
Potassium: 4.5 mEq/L (ref 3.5–5.3)
Sodium: 136 mEq/L (ref 135–145)
Total Protein: 6.8 g/dL (ref 6.0–8.3)

## 2010-02-13 LAB — CBC WITH DIFFERENTIAL/PLATELET
BASO%: 0 % (ref 0.0–2.0)
Basophils Absolute: 0 10*3/uL (ref 0.0–0.1)
EOS%: 0.2 % (ref 0.0–7.0)
HGB: 13.1 g/dL (ref 11.6–15.9)
MCHC: 33.4 g/dL (ref 31.5–36.0)
MONO#: 0.5 10*3/uL (ref 0.1–0.9)
lymph#: 0.9 10*3/uL (ref 0.9–3.3)

## 2010-02-26 ENCOUNTER — Ambulatory Visit (HOSPITAL_COMMUNITY): Admission: RE | Admit: 2010-02-26 | Discharge: 2010-02-26 | Payer: Self-pay | Admitting: Internal Medicine

## 2010-03-02 ENCOUNTER — Ambulatory Visit: Payer: Self-pay | Admitting: Internal Medicine

## 2010-03-06 LAB — CBC WITH DIFFERENTIAL/PLATELET
BASO%: 0.1 % (ref 0.0–2.0)
Basophils Absolute: 0 10*3/uL (ref 0.0–0.1)
EOS%: 0 % (ref 0.0–7.0)
HGB: 13 g/dL (ref 11.6–15.9)
LYMPH%: 14.3 % (ref 14.0–49.7)
MCH: 33.5 pg (ref 25.1–34.0)
MCV: 98.7 fL (ref 79.5–101.0)
MONO#: 0.8 10*3/uL (ref 0.1–0.9)
NEUT#: 5.3 10*3/uL (ref 1.5–6.5)
NEUT%: 73.9 % (ref 38.4–76.8)
WBC: 7.2 10*3/uL (ref 3.9–10.3)
lymph#: 1 10*3/uL (ref 0.9–3.3)

## 2010-03-06 LAB — COMPREHENSIVE METABOLIC PANEL
AST: 18 U/L (ref 0–37)
Albumin: 4.1 g/dL (ref 3.5–5.2)
Chloride: 106 mEq/L (ref 96–112)
Sodium: 138 mEq/L (ref 135–145)
Total Bilirubin: 0.3 mg/dL (ref 0.3–1.2)

## 2010-03-23 ENCOUNTER — Ambulatory Visit (HOSPITAL_COMMUNITY): Admission: RE | Admit: 2010-03-23 | Discharge: 2010-03-23 | Payer: Self-pay | Admitting: Internal Medicine

## 2010-03-27 LAB — CBC WITH DIFFERENTIAL/PLATELET
EOS%: 1 % (ref 0.0–7.0)
LYMPH%: 21.6 % (ref 14.0–49.7)
MCH: 33.2 pg (ref 25.1–34.0)
MCV: 97.1 fL (ref 79.5–101.0)
MONO#: 0.4 10*3/uL (ref 0.1–0.9)
MONO%: 8.9 % (ref 0.0–14.0)
RDW: 13.9 % (ref 11.2–14.5)

## 2010-03-27 LAB — COMPREHENSIVE METABOLIC PANEL
ALT: 12 U/L (ref 0–35)
AST: 18 U/L (ref 0–37)
Albumin: 4.1 g/dL (ref 3.5–5.2)
Alkaline Phosphatase: 66 U/L (ref 39–117)
BUN: 19 mg/dL (ref 6–23)
CO2: 20 mEq/L (ref 19–32)
Calcium: 9.9 mg/dL (ref 8.4–10.5)
Sodium: 139 mEq/L (ref 135–145)
Total Protein: 7.1 g/dL (ref 6.0–8.3)

## 2010-04-13 ENCOUNTER — Ambulatory Visit: Payer: Self-pay | Admitting: Internal Medicine

## 2010-04-17 LAB — COMPREHENSIVE METABOLIC PANEL
ALT: 13 U/L (ref 0–35)
Alkaline Phosphatase: 67 U/L (ref 39–117)
CO2: 20 mEq/L (ref 19–32)
Creatinine, Ser: 0.72 mg/dL (ref 0.40–1.20)
Glucose, Bld: 126 mg/dL — ABNORMAL HIGH (ref 70–99)
Total Bilirubin: 0.4 mg/dL (ref 0.3–1.2)

## 2010-04-17 LAB — CBC WITH DIFFERENTIAL/PLATELET
EOS%: 0 % (ref 0.0–7.0)
Eosinophils Absolute: 0 10*3/uL (ref 0.0–0.5)
LYMPH%: 14.1 % (ref 14.0–49.7)
MCH: 32.7 pg (ref 25.1–34.0)
MCV: 95.8 fL (ref 79.5–101.0)
MONO%: 5.4 % (ref 0.0–14.0)
NEUT#: 5 10*3/uL (ref 1.5–6.5)
Platelets: 240 10*3/uL (ref 145–400)
RBC: 4.07 10*6/uL (ref 3.70–5.45)
RDW: 14.2 % (ref 11.2–14.5)
nRBC: 0 % (ref 0–0)

## 2010-05-07 LAB — CBC WITH DIFFERENTIAL/PLATELET
BASO%: 0.2 % (ref 0.0–2.0)
EOS%: 0.5 % (ref 0.0–7.0)
Eosinophils Absolute: 0 10*3/uL (ref 0.0–0.5)
LYMPH%: 25 % (ref 14.0–49.7)
MCHC: 34.5 g/dL (ref 31.5–36.0)
MCV: 97.8 fL (ref 79.5–101.0)
MONO%: 3.9 % (ref 0.0–14.0)
NEUT#: 2.3 10*3/uL (ref 1.5–6.5)
Platelets: 231 10*3/uL (ref 145–400)
RBC: 3.66 10*6/uL — ABNORMAL LOW (ref 3.70–5.45)
RDW: 14.8 % — ABNORMAL HIGH (ref 11.2–14.5)

## 2010-05-07 LAB — COMPREHENSIVE METABOLIC PANEL
ALT: 12 U/L (ref 0–35)
AST: 19 U/L (ref 0–37)
Albumin: 4 g/dL (ref 3.5–5.2)
Alkaline Phosphatase: 68 U/L (ref 39–117)
Glucose, Bld: 94 mg/dL (ref 70–99)
Potassium: 4.2 mEq/L (ref 3.5–5.3)
Sodium: 142 mEq/L (ref 135–145)
Total Bilirubin: 0.5 mg/dL (ref 0.3–1.2)
Total Protein: 6.4 g/dL (ref 6.0–8.3)

## 2010-05-24 ENCOUNTER — Ambulatory Visit: Payer: Self-pay | Admitting: Internal Medicine

## 2010-05-25 ENCOUNTER — Ambulatory Visit (HOSPITAL_COMMUNITY)
Admission: RE | Admit: 2010-05-25 | Discharge: 2010-05-25 | Payer: Self-pay | Source: Home / Self Care | Attending: Internal Medicine | Admitting: Internal Medicine

## 2010-06-19 LAB — COMPREHENSIVE METABOLIC PANEL WITH GFR
ALT: 14 U/L (ref 0–35)
AST: 16 U/L (ref 0–37)
Albumin: 4.2 g/dL (ref 3.5–5.2)
Alkaline Phosphatase: 58 U/L (ref 39–117)
BUN: 13 mg/dL (ref 6–23)
CO2: 21 meq/L (ref 19–32)
Calcium: 9.1 mg/dL (ref 8.4–10.5)
Chloride: 104 meq/L (ref 96–112)
Creatinine, Ser: 0.71 mg/dL (ref 0.40–1.20)
Glucose, Bld: 144 mg/dL — ABNORMAL HIGH (ref 70–99)
Potassium: 4.2 meq/L (ref 3.5–5.3)
Sodium: 137 meq/L (ref 135–145)
Total Bilirubin: 0.4 mg/dL (ref 0.3–1.2)
Total Protein: 6.4 g/dL (ref 6.0–8.3)

## 2010-06-19 LAB — CBC WITH DIFFERENTIAL/PLATELET
BASO%: 0 % (ref 0.0–2.0)
Basophils Absolute: 0 10*3/uL (ref 0.0–0.1)
EOS%: 0 % (ref 0.0–7.0)
Eosinophils Absolute: 0 10*3/uL (ref 0.0–0.5)
HCT: 38.4 % (ref 34.8–46.6)
HGB: 13.3 g/dL (ref 11.6–15.9)
LYMPH%: 12.1 % — ABNORMAL LOW (ref 14.0–49.7)
MCH: 33.2 pg (ref 25.1–34.0)
MCHC: 34.6 g/dL (ref 31.5–36.0)
MCV: 95.8 fL (ref 79.5–101.0)
MONO#: 0.6 10*3/uL (ref 0.1–0.9)
MONO%: 7.4 % (ref 0.0–14.0)
NEUT#: 6.6 10*3/uL — ABNORMAL HIGH (ref 1.5–6.5)
NEUT%: 80.5 % — ABNORMAL HIGH (ref 38.4–76.8)
Platelets: 231 10*3/uL (ref 145–400)
RBC: 4.01 10*6/uL (ref 3.70–5.45)
RDW: 14.1 % (ref 11.2–14.5)
WBC: 8.2 10*3/uL (ref 3.9–10.3)
lymph#: 1 10*3/uL (ref 0.9–3.3)

## 2010-07-10 ENCOUNTER — Other Ambulatory Visit: Payer: Self-pay | Admitting: Internal Medicine

## 2010-07-10 ENCOUNTER — Encounter (HOSPITAL_BASED_OUTPATIENT_CLINIC_OR_DEPARTMENT_OTHER): Payer: Medicare Other | Admitting: Internal Medicine

## 2010-07-10 DIAGNOSIS — C3490 Malignant neoplasm of unspecified part of unspecified bronchus or lung: Secondary | ICD-10-CM

## 2010-07-10 DIAGNOSIS — Z5111 Encounter for antineoplastic chemotherapy: Secondary | ICD-10-CM

## 2010-07-10 DIAGNOSIS — C3430 Malignant neoplasm of lower lobe, unspecified bronchus or lung: Secondary | ICD-10-CM

## 2010-07-10 LAB — CBC WITH DIFFERENTIAL/PLATELET
BASO%: 0 % (ref 0.0–2.0)
Basophils Absolute: 0 10*3/uL (ref 0.0–0.1)
Eosinophils Absolute: 0 10*3/uL (ref 0.0–0.5)
HCT: 39.2 % (ref 34.8–46.6)
LYMPH%: 13.7 % — ABNORMAL LOW (ref 14.0–49.7)
MCH: 32.6 pg (ref 25.1–34.0)
MCHC: 33.9 g/dL (ref 31.5–36.0)
MONO#: 0.7 10*3/uL (ref 0.1–0.9)
NEUT#: 6.1 10*3/uL (ref 1.5–6.5)
Platelets: 257 10*3/uL (ref 145–400)
WBC: 7.8 10*3/uL (ref 3.9–10.3)

## 2010-07-10 LAB — COMPREHENSIVE METABOLIC PANEL
ALT: 14 U/L (ref 0–35)
AST: 21 U/L (ref 0–37)
Alkaline Phosphatase: 60 U/L (ref 39–117)
CO2: 20 mEq/L (ref 19–32)
Calcium: 8.9 mg/dL (ref 8.4–10.5)

## 2010-07-27 ENCOUNTER — Ambulatory Visit (HOSPITAL_COMMUNITY)
Admission: RE | Admit: 2010-07-27 | Discharge: 2010-07-27 | Disposition: A | Discharge status: Home / Self Care | Payer: Medicare Other | Source: Ambulatory Visit | Attending: Internal Medicine | Admitting: Internal Medicine

## 2010-07-27 ENCOUNTER — Other Ambulatory Visit (HOSPITAL_COMMUNITY): Payer: Medicare Other

## 2010-07-27 DIAGNOSIS — C3490 Malignant neoplasm of unspecified part of unspecified bronchus or lung: Secondary | ICD-10-CM

## 2010-07-31 ENCOUNTER — Other Ambulatory Visit: Payer: Self-pay | Admitting: Internal Medicine

## 2010-07-31 ENCOUNTER — Encounter (HOSPITAL_BASED_OUTPATIENT_CLINIC_OR_DEPARTMENT_OTHER): Payer: Medicare Other | Admitting: Internal Medicine

## 2010-07-31 DIAGNOSIS — Z5111 Encounter for antineoplastic chemotherapy: Secondary | ICD-10-CM

## 2010-07-31 DIAGNOSIS — C3430 Malignant neoplasm of lower lobe, unspecified bronchus or lung: Secondary | ICD-10-CM

## 2010-07-31 LAB — COMPREHENSIVE METABOLIC PANEL
ALT: 10 U/L (ref 0–35)
Albumin: 3.7 g/dL (ref 3.5–5.2)
BUN: 15 mg/dL (ref 6–23)
CO2: 21 mEq/L (ref 19–32)
Calcium: 9 mg/dL (ref 8.4–10.5)
Chloride: 102 mEq/L (ref 96–112)
Creatinine, Ser: 0.67 mg/dL (ref 0.40–1.20)

## 2010-07-31 LAB — CBC WITH DIFFERENTIAL/PLATELET
Eosinophils Absolute: 0 10*3/uL (ref 0.0–0.5)
HCT: 38.7 % (ref 34.8–46.6)
LYMPH%: 12.5 % — ABNORMAL LOW (ref 14.0–49.7)
MONO#: 1 10*3/uL — ABNORMAL HIGH (ref 0.1–0.9)
NEUT#: 6.5 10*3/uL (ref 1.5–6.5)
NEUT%: 76.3 % (ref 38.4–76.8)
Platelets: 258 10*3/uL (ref 145–400)
WBC: 8.6 10*3/uL (ref 3.9–10.3)
nRBC: 0 % (ref 0–0)

## 2010-08-21 ENCOUNTER — Other Ambulatory Visit: Payer: Self-pay | Admitting: Internal Medicine

## 2010-08-21 ENCOUNTER — Encounter (HOSPITAL_BASED_OUTPATIENT_CLINIC_OR_DEPARTMENT_OTHER): Payer: Medicare Other | Admitting: Internal Medicine

## 2010-08-21 DIAGNOSIS — C3430 Malignant neoplasm of lower lobe, unspecified bronchus or lung: Secondary | ICD-10-CM

## 2010-08-21 DIAGNOSIS — Z5111 Encounter for antineoplastic chemotherapy: Secondary | ICD-10-CM

## 2010-08-21 LAB — CBC WITH DIFFERENTIAL/PLATELET
BASO%: 0 % (ref 0.0–2.0)
HCT: 37.6 % (ref 34.8–46.6)
MCHC: 34 g/dL (ref 31.5–36.0)
MONO#: 0.4 10*3/uL (ref 0.1–0.9)
NEUT%: 59.2 % (ref 38.4–76.8)
RDW: 13.8 % (ref 11.2–14.5)
WBC: 3.5 10*3/uL — ABNORMAL LOW (ref 3.9–10.3)
lymph#: 1 10*3/uL (ref 0.9–3.3)

## 2010-08-21 LAB — COMPREHENSIVE METABOLIC PANEL
ALT: 11 U/L (ref 0–35)
AST: 17 U/L (ref 0–37)
Albumin: 4 g/dL (ref 3.5–5.2)
CO2: 21 mEq/L (ref 19–32)
Calcium: 9.6 mg/dL (ref 8.4–10.5)
Chloride: 105 mEq/L (ref 96–112)
Potassium: 4.1 mEq/L (ref 3.5–5.3)
Sodium: 139 mEq/L (ref 135–145)
Total Protein: 6.3 g/dL (ref 6.0–8.3)

## 2010-08-27 LAB — CBC
Platelets: 201 10*3/uL (ref 150–400)
RDW: 13.4 % (ref 11.5–15.5)
WBC: 5.1 10*3/uL (ref 4.0–10.5)

## 2010-08-27 LAB — APTT: aPTT: 28 seconds (ref 24–37)

## 2010-08-27 LAB — PROTIME-INR: Prothrombin Time: 12.2 seconds (ref 11.6–15.2)

## 2010-09-08 LAB — GLUCOSE, CAPILLARY: Glucose-Capillary: 90 mg/dL (ref 70–99)

## 2010-09-11 ENCOUNTER — Other Ambulatory Visit: Payer: Self-pay | Admitting: Internal Medicine

## 2010-09-11 ENCOUNTER — Encounter (HOSPITAL_BASED_OUTPATIENT_CLINIC_OR_DEPARTMENT_OTHER): Payer: Medicare Other | Admitting: Internal Medicine

## 2010-09-11 DIAGNOSIS — C3490 Malignant neoplasm of unspecified part of unspecified bronchus or lung: Secondary | ICD-10-CM

## 2010-09-11 DIAGNOSIS — C3430 Malignant neoplasm of lower lobe, unspecified bronchus or lung: Secondary | ICD-10-CM

## 2010-09-11 DIAGNOSIS — Z5111 Encounter for antineoplastic chemotherapy: Secondary | ICD-10-CM

## 2010-09-11 LAB — CBC WITH DIFFERENTIAL/PLATELET
BASO%: 0.2 % (ref 0.0–2.0)
EOS%: 0.2 % (ref 0.0–7.0)
MCH: 31.9 pg (ref 25.1–34.0)
MCHC: 33.9 g/dL (ref 31.5–36.0)
MCV: 94 fL (ref 79.5–101.0)
MONO%: 14.1 % — ABNORMAL HIGH (ref 0.0–14.0)
NEUT%: 67.6 % (ref 38.4–76.8)
RDW: 14.2 % (ref 11.2–14.5)
lymph#: 1.2 10*3/uL (ref 0.9–3.3)

## 2010-09-11 LAB — COMPREHENSIVE METABOLIC PANEL
AST: 19 U/L (ref 0–37)
Albumin: 4 g/dL (ref 3.5–5.2)
Alkaline Phosphatase: 65 U/L (ref 39–117)
BUN: 16 mg/dL (ref 6–23)
Glucose, Bld: 105 mg/dL — ABNORMAL HIGH (ref 70–99)
Potassium: 4.2 mEq/L (ref 3.5–5.3)
Sodium: 139 mEq/L (ref 135–145)
Total Bilirubin: 0.4 mg/dL (ref 0.3–1.2)

## 2010-09-27 ENCOUNTER — Ambulatory Visit (HOSPITAL_COMMUNITY)
Admission: RE | Admit: 2010-09-27 | Discharge: 2010-09-27 | Disposition: A | Discharge status: Home / Self Care | Payer: Medicare Other | Source: Ambulatory Visit | Attending: Internal Medicine | Admitting: Internal Medicine

## 2010-09-27 DIAGNOSIS — I998 Other disorder of circulatory system: Secondary | ICD-10-CM | POA: Insufficient documentation

## 2010-09-27 DIAGNOSIS — C3410 Malignant neoplasm of upper lobe, unspecified bronchus or lung: Secondary | ICD-10-CM | POA: Insufficient documentation

## 2010-09-27 DIAGNOSIS — N9489 Other specified conditions associated with female genital organs and menstrual cycle: Secondary | ICD-10-CM | POA: Insufficient documentation

## 2010-09-27 DIAGNOSIS — C3490 Malignant neoplasm of unspecified part of unspecified bronchus or lung: Secondary | ICD-10-CM

## 2010-09-27 DIAGNOSIS — R0602 Shortness of breath: Secondary | ICD-10-CM | POA: Insufficient documentation

## 2010-09-27 DIAGNOSIS — K449 Diaphragmatic hernia without obstruction or gangrene: Secondary | ICD-10-CM | POA: Insufficient documentation

## 2010-09-27 DIAGNOSIS — M5144 Schmorl's nodes, thoracic region: Secondary | ICD-10-CM | POA: Insufficient documentation

## 2010-09-27 DIAGNOSIS — Z90710 Acquired absence of both cervix and uterus: Secondary | ICD-10-CM | POA: Insufficient documentation

## 2010-10-02 ENCOUNTER — Encounter (HOSPITAL_BASED_OUTPATIENT_CLINIC_OR_DEPARTMENT_OTHER): Payer: Medicare Other | Admitting: Internal Medicine

## 2010-10-02 ENCOUNTER — Other Ambulatory Visit: Payer: Self-pay | Admitting: Internal Medicine

## 2010-10-02 DIAGNOSIS — C3430 Malignant neoplasm of lower lobe, unspecified bronchus or lung: Secondary | ICD-10-CM

## 2010-10-02 DIAGNOSIS — Z5111 Encounter for antineoplastic chemotherapy: Secondary | ICD-10-CM

## 2010-10-02 LAB — CBC WITH DIFFERENTIAL/PLATELET
BASO%: 0.1 % (ref 0.0–2.0)
Basophils Absolute: 0 10*3/uL (ref 0.0–0.1)
Eosinophils Absolute: 0 10*3/uL (ref 0.0–0.5)
HGB: 12.6 g/dL (ref 11.6–15.9)
LYMPH%: 13.9 % — ABNORMAL LOW (ref 14.0–49.7)
MCH: 31.7 pg (ref 25.1–34.0)
MCV: 93.5 fL (ref 79.5–101.0)
NEUT#: 6.6 10*3/uL — ABNORMAL HIGH (ref 1.5–6.5)
NEUT%: 77.9 % — ABNORMAL HIGH (ref 38.4–76.8)
Platelets: 337 10*3/uL (ref 145–400)
RDW: 14.8 % — ABNORMAL HIGH (ref 11.2–14.5)

## 2010-10-02 LAB — COMPREHENSIVE METABOLIC PANEL
ALT: 14 U/L (ref 0–35)
CO2: 18 mEq/L — ABNORMAL LOW (ref 19–32)
Calcium: 9.1 mg/dL (ref 8.4–10.5)
Chloride: 102 mEq/L (ref 96–112)
Potassium: 4.2 mEq/L (ref 3.5–5.3)
Sodium: 135 mEq/L (ref 135–145)
Total Protein: 6.2 g/dL (ref 6.0–8.3)

## 2010-10-16 NOTE — Assessment & Plan Note (Signed)
OFFICE VISIT   [name], [name]  DOB:  [birth year]                                        July 27, 2008  CHART #:  [id number]   She was seen with a followup CT scan by Dr. Arbutus Ped on July 20, 2008, and it showed that she still had these multiple tiny little  nodules, but the area that they thought was a new lesion, was probably  just some inflammation and a pulmonary infection that is now cleared.  So, we will put her back on a 26-month schedule and see her back again in  September for her next followup, since she is over a year and a half  since her surgery.  Her blood pressure was 123/86, pulse 73,  respirations 18, and sats were 99%.  Lungs were clear to auscultation  and percussion.   Ines Bloomer, M.D.  Electronically Signed   DPB/MEDQ  D:  07/27/2008  T:  07/27/2008  Job:  [id number]

## 2010-10-16 NOTE — Assessment & Plan Note (Signed)
OFFICE VISIT   [name], [name]  DOB:  12/22/1932                                        May 04, 2008  CHART #:  [id number]   HISTORY OF PRESENTING ILLNESS:  This is a 77 year old Caucasian female,  who is status post left VATS, left thoracotomy, and left lower lobectomy  with lymph node dissection, and intercostal muscle flap January 2008.  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  The patient was last seen in the office on Oct 27, 2007.  She had no evidence of recurrence at that time.  The patient had  a CAT scan of the chest done on April 21, 2008.  She was found to  have small lung nodules primarily in the right upper lobe that appeared  to have undergone interval growth.  They were ill-defined and somewhat  clustered.  Considerations include atypical infection and early  metastasis, otherwise there is no evidence of metastatic disease in the  chest.  The patient was then seen by Dr. Arbutus Ped in the office.  She has  been on doxycycline for about the last 2 weeks and will finish within  the next day.  The patient denies any shortness of breath, fever, or  chills.   PHYSICAL EXAMINATION:  VITAL SIGNS:  BP 138/82, pulse rate 76,  respiratory rate 18, and O2 sat 98% on room air.  CARDIOVASCULAR:  Regular rhythm.  PULMONARY:  Clear to auscultation bilaterally.  No rales, wheezes, or  rhonchi.   IMPRESSION AND PLAN:  1. Status post left VATS, left thoracotomy, left lower lobectomy,      lymph node dissection, intercostal muscle flap January 2008      (invasive well-to-moderately differentiated adenocarcinoma).  2. Recent CAT scan which showed small lung nodules (primarily in the      right upper lobe that have undergone interval growth, are ill-      defined, somewhat clustered).  She was placed on doxycycline and      provided this is an infectious process, hopefully a repeat CAT scan      of the chest will  show improvement.  However, we will continue to      monitor her closely to ensure      that this is not metastasis.  She has a followup appointment to see      Dr. Arbutus Ped with a repeat CAT scan in February.  She will then      follow up with Dr. Edwyna Shell shortly thereafter.   Ines Bloomer, M.D.  Electronically Signed   DZ/MEDQ  D:  05/04/2008  T:  05/04/2008  Job:  [id number]

## 2010-10-16 NOTE — Letter (Signed)
January 21, 2007   Lajuana Matte, MD  [phone] [address]  [place], [place] [address]   Re:  RIELEY, KHALSA              DOB:  08-17-1932   Dear Arbutus Ped,   Ms. Phillis came today.  Her chest x-ray is stable.  Her blood pressure  is 136/91, pulse 82, respirations 18, saturating 98%.  Lungs were clear  to auscultation and percussion.  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  From my standpoint she is doing well overall.  I will see her back again  after CT scan in November, 2008.   Sincerely,   Ines Bloomer, M.D.  Electronically Signed   DPB/MEDQ  D:  01/21/2007  T:  01/22/2007  Job:  [id number]

## 2010-10-16 NOTE — Letter (Signed)
May 13, 2007   Lajuana Matte, MD  [phone] [address]  [place], [place] [address]   Re:  VAUDIE, ENGEBRETSEN              DOB:  [birth year]   Dear Arbutus Ped:   I saw Ms. Kroon for follow up today. Her blood pressure is 129/82,  pulse 74, respirations 18, saturations were 98%. The CT scan that you  ordered showed no evidence of recurrence and the nodule in the left  upper lobe was still 4.4 millimeters. I think we need to watch this and  I will plan to see her back again in May after her neck CT scan for  follow up of this left upper lobe nodule. She still that she cannot  taste very much and has some pain in her foot, but overall, she is doing  well.   Ines Bloomer, M.D.  Electronically Signed   DPB/MEDQ  D:  05/13/2007  T:  05/14/2007  Job:  [id number]

## 2010-10-16 NOTE — Assessment & Plan Note (Signed)
OFFICE VISIT   TIAJAH, OYSTER  DOB:  1932-12-26                                        Oct 27, 2007  CHART #:  [id number]   HISTORY OF PRESENT ILLNESS:  Ms. Dicostanzo is a 77 year old Caucasian  female who is status post left VATS, left thoracotomy, and left lower  lobectomy with lymph node dissection and intercostal muscle flap in  January of 2008. Pathology was consistent with invasive well-to-  moderately differentiated adenocarcinoma. No metastatic carcinoma was  seen in the lymph nodes, however. The patient was last seen in the  office on May 13, 2007. A CT scan done at that time showed no  evidence of recurrence and that the left upper lobe nodule still  remained 4.4 mm. She presents to the office today with a complaint of  some numbness under the left chest posterior incision site.   PHYSICAL EXAMINATION:  GENERAL:  This is a pleasant 77 year old  Caucasian female in no acute distress who is alert, oriented and  cooperative.  LATEST VITAL SIGNS:  Reveal BP is 124/83, pulse rate 66, respiratory  rate 18, O2 saturation 99% on room air.  CARDIOVASCULAR:  Regular rate and rhythm.  PULMONARY:  Clear to auscultation bilaterally. No rales, wheezes, or  rhonchi. Wounds are well healed.   CT scan today revealed no evidence of recurrence and that the left upper  lobe nodule again remains stable.   IMPRESSION AND PLAN:  The patient continues to do well postoperatively.  She shows no evidence of recurrence, and again the left upper lobe  nodule remains stable. Dr. Edwyna Shell will see  the patient after having another CAT scan in November, and the patient  will then continue to be followed closely by Dr. Arbutus Ped.   Doree Fudge, PA   DZ/MEDQ  D:  10/27/2007  T:  10/27/2007  Job:  [id number]   cc:   Lajuana Matte, MD

## 2010-10-16 NOTE — Letter (Signed)
August 02, 2009   Velora Heckler. Arbutus Ped, MD  [address]  [place], [place] [address]   Re:  [name], [name]              DOB:  08-03-1932   Dear Arbutus Ped,   The patient came for followup today and her CT scan shows that these  nodules in the right upper lobe have increased in size.  There is also  one in the right middle lobe and a questionable one in the left lung.  Thus we have worries about it that even though she had a negative PET in  the last year, they make [place] worry that this is a slow-growing  adenocarcinoma.  I think we will discuss this at conference, and I  probably would recommend trying to do a needle biopsy if that is  feasible of these, and I will see her back again, and we will give her a  call after our conference.  Her blood pressure was 131/82, pulse 86,  respirations 16, sats were 98%.   Ines Bloomer, M.D.  Electronically Signed   DPB/MEDQ  D:  08/02/2009  T:  08/03/2009  Job:  [id number]

## 2010-10-16 NOTE — Assessment & Plan Note (Signed)
OFFICE VISIT   [name], [name]  DOB:  [birth year]                                        January 31, 2009  CHART #:  [id number]   The patient came for followup today.  Her PET scan was negative and the  CT scan showed a questionable slight increase in the nodular opacities  in the right upper lobe and right middle lobe but given the negative PET  scan, it is hard to say whether this is metastatic disease or not.  We  will continue to follow her and she is scheduled for another CT scan in  February and I will see her back again at that time.  Dr. Arbutus Ped and I  will be following together.  Her blood pressure was 117/74, pulse 83,  respirations 18, sats were 94%   Ines Bloomer, M.D.  Electronically Signed   DPB/MEDQ  D:  01/31/2009  T:  02/01/2009  Job:  [id number]

## 2010-10-19 NOTE — Discharge Summary (Signed)
[name], [name] NO.:  [id number]   MEDICAL RECORD NO.:  [id number]          PATIENT TYPE:  INP   LOCATION:  2002                         FACILITY:  [facility]   PHYSICIAN:  Ines Bloomer, M.D. DATE OF BIRTH:  1933/04/07   DATE OF ADMISSION:  06/19/2006  DATE OF DISCHARGE:  06/25/2006                               DISCHARGE SUMMARY   PRIMARY ADMITTING DIAGNOSIS:  Left lower lobe lung mass.   ADDITIONAL DISCHARGE DIAGNOSES:  1. Stage IB (T2, N0, NX) invasive to well to moderately differentiated      adenocarcinoma of the left lung.  2. History of hypercholesterolemia.   PROCEDURES:  Performed left VAT, left minithoracotomy with left lower  lobectomy, lymph node dissection, and intercostal muscle flap closure of  bronchial stump.   HISTORY OF PRESENT ILLNESS:  The patient is a 77 year old white female  who recently presented to the [facility] office for surgical evaluation of  left lower lobe mass. She is a nonsmoker, but has had a long exposure to  second hand smoke and has developed a chronic cough. A chest x-ray was  performed as well as a CT scan which showed a large 5-7 cm mass of the  left lower lobe. There was also a 5 mm left upper lobe lesion as well.  She has had no hemoptysis, fevers, chills or weight loss. A PET scan was  performed which showed an increased uptake in the left lower lobe with  an SUV 5.7. It was  Dr. Scheryl Darter opinion that she should undergo a left VAT with a left  lower lobectomy at this time. He explained the risks, benefits, and  alternatives of surgery to the patient and she agreed to proceed.   HOSPITAL COURSE:  She was admitted to [facility] on June 19, 2006. She underwent a left lower lobectomy as described in detail above.  She tolerated the procedure well and was transferred to the step-down  unit in stable condition. Postoperatively she has done well. Her chest  tubes were removed in the usual fashion, and her  present chest x-ray  shows only a tiny left pneumothorax and, otherwise, clear. She had some  mild nausea initially, but her diet was slowly advanced as nausea  resolved and presently she is tolerating a regular diet and having  normal bowel and bladder function. She has remained afebrile, and her  vital signs have been stable throughout her admission. Her pain is well-  controlled. Her most recent labs showed a hemoglobin of 11.3, hematocrit  32.4, platelets 280, white count 4.4. Sodium 137, potassium 3.6, BUN 6,  creatinine 0.5. She is ambulating the halls without difficulty. She has  been transferred to the floor and progressing well. Her incisions are  all healing well. Her final pathology revealed a T2, N0 (stage IB  adenocarcinoma). She has been seen by Dr. Arbutus Ped, and he does feel that  adjuvant chemotherapy would benefit her at this time. He will arrange  outpatient [facility]. She has been  evaluated on morning rounds on June 25, 2006 and felt  ready for  discharge home at this time.   DISCHARGE MEDICATIONS:  1. Tylox 1-2 q.4 h. p.r.n. for pain.  2. Multivitamin once daily.  3. Calcium 600 mg plus D two tablets daily.   DISCHARGE INSTRUCTIONS:  She is asked to refrain from driving, heavy  lifting or strenuous activity. She may continue ambulating daily and  using her incentive spirometer. She may shower daily and clean her  incisions with soap and water. She will continue her same preoperative  diet.   DISCHARGE FOLLOWUP:  She will see Dr. Edwyna Shell back in the office in one  week with a chest x-ray. She will see  Dr. Arbutus Ped in the [facility] cancer [facility] on February 5. In the interim  if she experiences any problems or asks questions she is asked to  contact our office.      Coral Ceo, P.A.      Ines Bloomer, M.D.  Electronically Signed    GC/MEDQ  D:  06/25/2006  T:  06/25/2006  Job:  [id number]   cc:   Pam Drown,  M.D.  Lajuana Matte, MD

## 2010-10-19 NOTE — H&P (Signed)
[name], [name] NO.:  [id number]   MEDICAL RECORD NO.:  [id number]           PATIENT TYPE:   LOCATION:                                 FACILITY:   PHYSICIAN:  Ines Bloomer, M.D.      DATE OF BIRTH:   DATE OF ADMISSION:  06/19/2006  DATE OF DISCHARGE:                              HISTORY & PHYSICAL   CHIEF COMPLAINT:  Chronic cough.   HISTORY OF PRESENT ILLNESS:  [age]-year-old nonsmoker has had a  long exposure to second-hand smoke, developed a chronic cough and a  chest x-ray and a CT scan showed a large 5-7 cm mass in the left lower  lobe.  She also gives a history of chronic bronchitis.  There was a 5 mm  left upper lobe lesion too.  She has had no hemoptysis, fevers, chills  or weight loss.  Cough is worse at night and it has been going on for  over a year.  Her PET scan was done, which showed an SUV of 5.7, it was  thought to be both an obstructive mass, as well as a malignant mass with  obstruction.  She is admitted for evacuating.   PAST MEDICAL HISTORY:  Significant for hypercholesterolemia.   MEDICATIONS:  Include vitamin and calcium.   ALLERGIES:  SHE IS ALLERGIC DOXYCYCLINE, WHICH CAUSES PERIORBITAL EDEMA.   FAMILY HISTORY:  Positive for vascular disease.  Negative for cancer.   SOCIAL HISTORY:  She is widowed.  Has 2 children.  Retired.  She does  not smoke or drink.   REVIEW OF SYSTEMS:  GENERAL:  She has had some weight loss, but this has  stabilized.  CARDIAC:  No angina or atrial fibrillation.  PULMONARY:  See history of present illness.  GI:  No nausea, vomiting, constipation  or diarrhea.  GU:  No kidney disease, frequent urination or dysuria.  VASCULAR:  No claudication, DVT or TIAs.  NEUROLOGIC:  No headaches,  blackout or seizures.  MUSCULOSKELETAL:  No joint pain, rash or muscular  pain.  PSYCHIATRIC:  No depression or nervousness.  HEENT:  No change in  her eyesight or hearing.  HEMATOLOGICAL:  No problems with  clotting or  anemia.   PHYSICAL EXAMINATION:  GENERAL:  She is a slightly obese Caucasian  female in no acute distress.  VITAL SIGNS:  Her blood pressure is 126/82.  Pulse 78.  Respirations 18.  Saturations are 97%.  Pulmonary function tests showed an FVC of 2.61 of  99% of predicted, FEV-1 of 2.02, is 97% of predicted.  HEENT:  Head is atraumatic.  Eyes:  Pupils equal and reactive to light  and accommodation.  Extraocular movements are normal.  Nose: There is no  septal deviation.  Mouth:  Without lesion.  NECK:  Supple with no supraclavicular axillary adenopathy.  No  thyromegaly.  CHEST:  There is decreased breath sounds on the left lower lobe;  otherwise, clear to auscultation and percussion.  HEART:  Regular sinus rhythm.  No murmurs.  ABDOMEN:  Soft.  There is no hepatosplenomegaly.  Bowel sounds are  normal.  EXTREMITIES:  Pulses are 2+.  There is no clubbing or edema.  NEUROLOGICAL:  She is oriented x3.  Sensory and motor intact.   IMPRESSION:  1. Left lower lobe mass, rule out nonsmall cell lung cancer.  2. Hypercholesterolemia.   PLAN:  Left VATs, left lower lobectomy.           ______________________________  Ines Bloomer, M.D.     DPB/MEDQ  D:  06/16/2006  T:  06/17/2006  Job:  [id number]

## 2010-10-19 NOTE — Op Note (Signed)
NAMEDESEREA, BORDLEY NO.:  [id number]   MEDICAL RECORD NO.:  [id number]          PATIENT TYPE:  INP   LOCATION:  3308                         FACILITY:  [facility]   PHYSICIAN:  Ines Bloomer, M.D. DATE OF BIRTH:  December 16, 1932   DATE OF PROCEDURE:  06/19/2006  DATE OF DISCHARGE:                               OPERATIVE REPORT   PREOPERATIVE DIAGNOSIS:  Left lower lobe mass.   POSTOPERATIVE DIAGNOSIS:  Left lower lobe mass.   OPERATION PERFORMED:  1. Left VATS.  2. Left thoracotomy.  3. Left lower lobectomy with node dissection and intercostal muscle      flap.   SURGEON:  Ines Bloomer, M.D.   ANESTHESIA:  General anesthesia.   DESCRIPTION OF PROCEDURE:  After percutaneous insertion of all  monitoring lines, the patient underwent general anesthesia.  He was  turned to the left lateral thoracotomy position.  He was prepped and  draped in the usual sterile manner.  Two trocar sites were made in the  anterior and mid axillary line of the 7th and 8th intercostal space.  Two trocars were inserted.  The left lung was deflated and had a dual-  lumen tube in place.  The lesion could be seen in the posterobasilar  segments of the left lower lobe, so I proceeded with the left lateral  thoracotomy, partially dividing latissimus and reflecting the serratus  anteriorly.  Then we took down the intercostal muscle flap between the  fifth and the sixth ribs with electrocautery and reflected it  anteriorly.  A portion of the sixth rib was taken subperiosteally at the  angle.  Two __________ were placed at right angles.  The inferior  pulmonary ligament was taken down with electrocautery harvesting two 9L  nodes.  The inferior pulmonary vein was dissected out.  Then dissection  was directed to the fissure.  The fissure was partially divided with  electrocautery.  The superior portion of the fissure was stable with an  autosuture, 45 straight suture.  We harvested several  10L and 11L nodes  from around the bronchus and the pulmonary artery.  In fact, we removed  all nodes that we could and then went posteriorly to dissect down some  more 10L and 11L nodes and dissecting up to the aortopulmonary window  where we removed two 5 nodes.  The inferior pulmonary vein was divided  with the autosuture 45 wide Roticulator.  The __________ was then  divided with the autosuture 30 wide Roticulator.  The bronchus was  divided with the autosuture 45 straight stapler.  The left lower lobe  was removed, 4L nodes were harvested from around the left main stem  bronchus.  The Marcaine block was done in the usual fashion.  The on-cue  was placed subpleurally in the usual fashion.  A chest tube anteriorly  and a chest tube posteriorly were tied in place with 0 silk.  FloSeal  was applied to the staple line.  On top of the stump was placed the  intercostal muscle and sutured in place with 3-0 silk.  Four drill holes  were  placed  through the sixth rib and the pericostals were then wrapped around the  superior portion of the anterior rib.  The chest was closed with  interrupted, #1 Vicryl in the muscle layer, 2-0 Vicryl in the  subcutaneous tissue and 3-0 Vicryl as a subcuticular stitch, Dermabond  for the skin.  The patient returned to the recovery room in stable  condition.      Ines Bloomer, M.D.  Electronically Signed     DPB/MEDQ  D:  06/19/2006  T:  06/19/2006  Job:  [id number]   cc:   Pam Drown, M.D.

## 2010-10-23 ENCOUNTER — Other Ambulatory Visit: Payer: Self-pay | Admitting: Internal Medicine

## 2010-10-23 ENCOUNTER — Encounter (HOSPITAL_BASED_OUTPATIENT_CLINIC_OR_DEPARTMENT_OTHER): Payer: Medicare Other | Admitting: Internal Medicine

## 2010-10-23 DIAGNOSIS — Z5111 Encounter for antineoplastic chemotherapy: Secondary | ICD-10-CM

## 2010-10-23 DIAGNOSIS — C3430 Malignant neoplasm of lower lobe, unspecified bronchus or lung: Secondary | ICD-10-CM

## 2010-10-23 LAB — CBC WITH DIFFERENTIAL/PLATELET
BASO%: 0.1 % (ref 0.0–2.0)
EOS%: 0 % (ref 0.0–7.0)
LYMPH%: 11.8 % — ABNORMAL LOW (ref 14.0–49.7)
MCHC: 33.8 g/dL (ref 31.5–36.0)
MONO#: 0.6 10*3/uL (ref 0.1–0.9)
Platelets: 275 10*3/uL (ref 145–400)
RBC: 3.95 10*6/uL (ref 3.70–5.45)
WBC: 7.3 10*3/uL (ref 3.9–10.3)
lymph#: 0.9 10*3/uL (ref 0.9–3.3)
nRBC: 0 % (ref 0–0)

## 2010-10-23 LAB — COMPREHENSIVE METABOLIC PANEL
ALT: 13 U/L (ref 0–35)
AST: 20 U/L (ref 0–37)
Alkaline Phosphatase: 62 U/L (ref 39–117)
Calcium: 9.3 mg/dL (ref 8.4–10.5)
Chloride: 106 mEq/L (ref 96–112)
Creatinine, Ser: 0.67 mg/dL (ref 0.40–1.20)

## 2010-11-13 ENCOUNTER — Other Ambulatory Visit: Payer: Self-pay | Admitting: Internal Medicine

## 2010-11-13 ENCOUNTER — Encounter (HOSPITAL_BASED_OUTPATIENT_CLINIC_OR_DEPARTMENT_OTHER): Payer: Medicare Other | Admitting: Internal Medicine

## 2010-11-13 DIAGNOSIS — C3430 Malignant neoplasm of lower lobe, unspecified bronchus or lung: Secondary | ICD-10-CM

## 2010-11-13 DIAGNOSIS — Z5111 Encounter for antineoplastic chemotherapy: Secondary | ICD-10-CM

## 2010-11-13 DIAGNOSIS — C3490 Malignant neoplasm of unspecified part of unspecified bronchus or lung: Secondary | ICD-10-CM

## 2010-11-13 LAB — CBC WITH DIFFERENTIAL/PLATELET
BASO%: 0.5 % (ref 0.0–2.0)
Basophils Absolute: 0 10*3/uL (ref 0.0–0.1)
EOS%: 1.2 % (ref 0.0–7.0)
HCT: 37.4 % (ref 34.8–46.6)
LYMPH%: 36.2 % (ref 14.0–49.7)
MCH: 31.7 pg (ref 25.1–34.0)
MCHC: 33.7 g/dL (ref 31.5–36.0)
MCV: 94 fL (ref 79.5–101.0)
MONO%: 10.9 % (ref 0.0–14.0)
NEUT%: 51.2 % (ref 38.4–76.8)
Platelets: 255 10*3/uL (ref 145–400)
lymph#: 1.6 10*3/uL (ref 0.9–3.3)

## 2010-11-13 LAB — COMPREHENSIVE METABOLIC PANEL
AST: 19 U/L (ref 0–37)
Alkaline Phosphatase: 61 U/L (ref 39–117)
BUN: 13 mg/dL (ref 6–23)
Creatinine, Ser: 0.7 mg/dL (ref 0.50–1.10)
Glucose, Bld: 91 mg/dL (ref 70–99)
Total Bilirubin: 0.4 mg/dL (ref 0.3–1.2)

## 2010-11-29 ENCOUNTER — Encounter (HOSPITAL_COMMUNITY): Payer: Self-pay

## 2010-11-29 ENCOUNTER — Ambulatory Visit (HOSPITAL_COMMUNITY)
Admission: RE | Admit: 2010-11-29 | Discharge: 2010-11-29 | Disposition: A | Discharge status: Home / Self Care | Payer: Medicare Other | Source: Ambulatory Visit | Attending: Internal Medicine | Admitting: Internal Medicine

## 2010-11-29 DIAGNOSIS — K449 Diaphragmatic hernia without obstruction or gangrene: Secondary | ICD-10-CM | POA: Insufficient documentation

## 2010-11-29 DIAGNOSIS — C3490 Malignant neoplasm of unspecified part of unspecified bronchus or lung: Secondary | ICD-10-CM | POA: Insufficient documentation

## 2010-11-29 DIAGNOSIS — Z902 Acquired absence of lung [part of]: Secondary | ICD-10-CM | POA: Insufficient documentation

## 2010-11-29 DIAGNOSIS — I771 Stricture of artery: Secondary | ICD-10-CM | POA: Insufficient documentation

## 2010-11-29 DIAGNOSIS — R0602 Shortness of breath: Secondary | ICD-10-CM | POA: Insufficient documentation

## 2010-11-29 HISTORY — DX: Malignant (primary) neoplasm, unspecified: C80.1

## 2010-12-04 ENCOUNTER — Other Ambulatory Visit: Payer: Self-pay | Admitting: Internal Medicine

## 2010-12-04 ENCOUNTER — Encounter (HOSPITAL_BASED_OUTPATIENT_CLINIC_OR_DEPARTMENT_OTHER): Payer: Medicare Other | Admitting: Internal Medicine

## 2010-12-04 DIAGNOSIS — C3430 Malignant neoplasm of lower lobe, unspecified bronchus or lung: Secondary | ICD-10-CM

## 2010-12-04 DIAGNOSIS — Z5111 Encounter for antineoplastic chemotherapy: Secondary | ICD-10-CM

## 2010-12-04 LAB — CBC WITH DIFFERENTIAL/PLATELET
Basophils Absolute: 0 10*3/uL (ref 0.0–0.1)
EOS%: 0.1 % (ref 0.0–7.0)
Eosinophils Absolute: 0 10*3/uL (ref 0.0–0.5)
HCT: 37.2 % (ref 34.8–46.6)
HGB: 12.6 g/dL (ref 11.6–15.9)
MCH: 31.5 pg (ref 25.1–34.0)
MCV: 93 fL (ref 79.5–101.0)
MONO%: 10.1 % (ref 0.0–14.0)
NEUT#: 7.8 10*3/uL — ABNORMAL HIGH (ref 1.5–6.5)
NEUT%: 80.4 % — ABNORMAL HIGH (ref 38.4–76.8)
RDW: 15 % — ABNORMAL HIGH (ref 11.2–14.5)

## 2010-12-04 LAB — COMPREHENSIVE METABOLIC PANEL
AST: 17 U/L (ref 0–37)
Albumin: 4.1 g/dL (ref 3.5–5.2)
BUN: 16 mg/dL (ref 6–23)
Calcium: 9.6 mg/dL (ref 8.4–10.5)
Chloride: 103 mEq/L (ref 96–112)
Creatinine, Ser: 0.73 mg/dL (ref 0.50–1.10)
Glucose, Bld: 115 mg/dL — ABNORMAL HIGH (ref 70–99)
Potassium: 4.1 mEq/L (ref 3.5–5.3)

## 2010-12-25 ENCOUNTER — Encounter (HOSPITAL_BASED_OUTPATIENT_CLINIC_OR_DEPARTMENT_OTHER): Payer: Medicare Other | Admitting: Internal Medicine

## 2010-12-25 ENCOUNTER — Other Ambulatory Visit: Payer: Self-pay | Admitting: Internal Medicine

## 2010-12-25 DIAGNOSIS — Z5111 Encounter for antineoplastic chemotherapy: Secondary | ICD-10-CM

## 2010-12-25 DIAGNOSIS — C3430 Malignant neoplasm of lower lobe, unspecified bronchus or lung: Secondary | ICD-10-CM

## 2010-12-25 LAB — COMPREHENSIVE METABOLIC PANEL
Albumin: 3.9 g/dL (ref 3.5–5.2)
Alkaline Phosphatase: 66 U/L (ref 39–117)
BUN: 15 mg/dL (ref 6–23)
CO2: 24 mEq/L (ref 19–32)
Calcium: 9.3 mg/dL (ref 8.4–10.5)
Chloride: 107 mEq/L (ref 96–112)
Glucose, Bld: 86 mg/dL (ref 70–99)
Potassium: 4.3 mEq/L (ref 3.5–5.3)
Sodium: 142 mEq/L (ref 135–145)
Total Protein: 6.1 g/dL (ref 6.0–8.3)

## 2010-12-25 LAB — CBC WITH DIFFERENTIAL/PLATELET
Basophils Absolute: 0 10*3/uL (ref 0.0–0.1)
Eosinophils Absolute: 0 10*3/uL (ref 0.0–0.5)
HCT: 37.8 % (ref 34.8–46.6)
HGB: 12.8 g/dL (ref 11.6–15.9)
MONO#: 0.5 10*3/uL (ref 0.1–0.9)
NEUT#: 2.3 10*3/uL (ref 1.5–6.5)
NEUT%: 53.9 % (ref 38.4–76.8)
RDW: 14.6 % — ABNORMAL HIGH (ref 11.2–14.5)
lymph#: 1.5 10*3/uL (ref 0.9–3.3)

## 2011-01-15 ENCOUNTER — Other Ambulatory Visit: Payer: Self-pay | Admitting: Internal Medicine

## 2011-01-15 ENCOUNTER — Encounter (HOSPITAL_BASED_OUTPATIENT_CLINIC_OR_DEPARTMENT_OTHER): Payer: Medicare Other | Admitting: Internal Medicine

## 2011-01-15 DIAGNOSIS — Z5111 Encounter for antineoplastic chemotherapy: Secondary | ICD-10-CM

## 2011-01-15 DIAGNOSIS — R0609 Other forms of dyspnea: Secondary | ICD-10-CM

## 2011-01-15 DIAGNOSIS — C3490 Malignant neoplasm of unspecified part of unspecified bronchus or lung: Secondary | ICD-10-CM

## 2011-01-15 DIAGNOSIS — C3430 Malignant neoplasm of lower lobe, unspecified bronchus or lung: Secondary | ICD-10-CM

## 2011-01-15 LAB — COMPREHENSIVE METABOLIC PANEL
ALT: 12 U/L (ref 0–35)
AST: 22 U/L (ref 0–37)
BUN: 18 mg/dL (ref 6–23)
Calcium: 9 mg/dL (ref 8.4–10.5)
Chloride: 105 mEq/L (ref 96–112)
Creatinine, Ser: 0.72 mg/dL (ref 0.50–1.10)
Total Bilirubin: 0.4 mg/dL (ref 0.3–1.2)

## 2011-01-15 LAB — CBC WITH DIFFERENTIAL/PLATELET
Eosinophils Absolute: 0.1 10*3/uL (ref 0.0–0.5)
HGB: 14 g/dL (ref 11.6–15.9)
MCV: 96.5 fL (ref 79.5–101.0)
MONO%: 11 % (ref 0.0–14.0)
NEUT#: 2.7 10*3/uL (ref 1.5–6.5)
RBC: 4.3 10*6/uL (ref 3.70–5.45)
RDW: 14.8 % — ABNORMAL HIGH (ref 11.2–14.5)
WBC: 5.3 10*3/uL (ref 3.9–10.3)
lymph#: 1.9 10*3/uL (ref 0.9–3.3)

## 2011-02-01 ENCOUNTER — Ambulatory Visit (HOSPITAL_COMMUNITY)
Admission: RE | Admit: 2011-02-01 | Discharge: 2011-02-01 | Disposition: A | Discharge status: Home / Self Care | Payer: Medicare Other | Source: Ambulatory Visit | Attending: Internal Medicine | Admitting: Internal Medicine

## 2011-02-01 DIAGNOSIS — C3490 Malignant neoplasm of unspecified part of unspecified bronchus or lung: Secondary | ICD-10-CM | POA: Insufficient documentation

## 2011-02-01 DIAGNOSIS — N9489 Other specified conditions associated with female genital organs and menstrual cycle: Secondary | ICD-10-CM | POA: Insufficient documentation

## 2011-02-01 DIAGNOSIS — J984 Other disorders of lung: Secondary | ICD-10-CM | POA: Insufficient documentation

## 2011-02-05 ENCOUNTER — Other Ambulatory Visit: Payer: Self-pay | Admitting: Internal Medicine

## 2011-02-05 ENCOUNTER — Encounter (HOSPITAL_BASED_OUTPATIENT_CLINIC_OR_DEPARTMENT_OTHER): Payer: Medicare Other | Admitting: Internal Medicine

## 2011-02-05 DIAGNOSIS — C3430 Malignant neoplasm of lower lobe, unspecified bronchus or lung: Secondary | ICD-10-CM

## 2011-02-05 DIAGNOSIS — Z5111 Encounter for antineoplastic chemotherapy: Secondary | ICD-10-CM

## 2011-02-05 LAB — CBC WITH DIFFERENTIAL/PLATELET
Basophils Absolute: 0 10*3/uL (ref 0.0–0.1)
Eosinophils Absolute: 0.1 10*3/uL (ref 0.0–0.5)
HCT: 38.6 % (ref 34.8–46.6)
HGB: 13 g/dL (ref 11.6–15.9)
LYMPH%: 37 % (ref 14.0–49.7)
MCV: 96.3 fL (ref 79.5–101.0)
MONO#: 0.4 10*3/uL (ref 0.1–0.9)
MONO%: 10.4 % (ref 0.0–14.0)
NEUT#: 2 10*3/uL (ref 1.5–6.5)
NEUT%: 51 % (ref 38.4–76.8)
Platelets: 204 10*3/uL (ref 145–400)
WBC: 4 10*3/uL (ref 3.9–10.3)

## 2011-02-05 LAB — COMPREHENSIVE METABOLIC PANEL
ALT: 14 U/L (ref 0–35)
CO2: 23 mEq/L (ref 19–32)
Calcium: 9.2 mg/dL (ref 8.4–10.5)
Chloride: 106 mEq/L (ref 96–112)
Creatinine, Ser: 0.68 mg/dL (ref 0.50–1.10)
Glucose, Bld: 80 mg/dL (ref 70–99)
Total Bilirubin: 0.4 mg/dL (ref 0.3–1.2)

## 2011-02-26 ENCOUNTER — Other Ambulatory Visit: Payer: Self-pay | Admitting: Internal Medicine

## 2011-02-26 ENCOUNTER — Encounter (HOSPITAL_BASED_OUTPATIENT_CLINIC_OR_DEPARTMENT_OTHER): Payer: Medicare Other | Admitting: Internal Medicine

## 2011-02-26 DIAGNOSIS — Z5111 Encounter for antineoplastic chemotherapy: Secondary | ICD-10-CM

## 2011-02-26 DIAGNOSIS — C3430 Malignant neoplasm of lower lobe, unspecified bronchus or lung: Secondary | ICD-10-CM

## 2011-02-26 LAB — CBC WITH DIFFERENTIAL/PLATELET
BASO%: 0.2 % (ref 0.0–2.0)
Eosinophils Absolute: 0 10*3/uL (ref 0.0–0.5)
HCT: 38.4 % (ref 34.8–46.6)
LYMPH%: 42.2 % (ref 14.0–49.7)
MCHC: 34.4 g/dL (ref 31.5–36.0)
MONO#: 0.5 10*3/uL (ref 0.1–0.9)
NEUT#: 2.3 10*3/uL (ref 1.5–6.5)
Platelets: 248 10*3/uL (ref 145–400)
RBC: 4.01 10*6/uL (ref 3.70–5.45)
WBC: 5 10*3/uL (ref 3.9–10.3)
lymph#: 2.1 10*3/uL (ref 0.9–3.3)
nRBC: 0 % (ref 0–0)

## 2011-02-26 LAB — COMPREHENSIVE METABOLIC PANEL
ALT: 15 U/L (ref 0–35)
AST: 23 U/L (ref 0–37)
Albumin: 3.5 g/dL (ref 3.5–5.2)
CO2: 24 mEq/L (ref 19–32)
Calcium: 8.9 mg/dL (ref 8.4–10.5)
Chloride: 106 mEq/L (ref 96–112)
Potassium: 3.4 mEq/L — ABNORMAL LOW (ref 3.5–5.3)
Total Protein: 6.5 g/dL (ref 6.0–8.3)

## 2011-03-07 ENCOUNTER — Encounter: Payer: Self-pay | Admitting: *Deleted

## 2011-03-13 ENCOUNTER — Encounter: Payer: Self-pay | Admitting: *Deleted

## 2011-03-19 ENCOUNTER — Encounter (HOSPITAL_BASED_OUTPATIENT_CLINIC_OR_DEPARTMENT_OTHER): Payer: Medicare Other | Admitting: Internal Medicine

## 2011-03-19 ENCOUNTER — Other Ambulatory Visit: Payer: Self-pay | Admitting: Internal Medicine

## 2011-03-19 DIAGNOSIS — Z5111 Encounter for antineoplastic chemotherapy: Secondary | ICD-10-CM

## 2011-03-19 DIAGNOSIS — Z23 Encounter for immunization: Secondary | ICD-10-CM

## 2011-03-19 DIAGNOSIS — C3430 Malignant neoplasm of lower lobe, unspecified bronchus or lung: Secondary | ICD-10-CM

## 2011-03-19 LAB — CBC WITH DIFFERENTIAL/PLATELET
BASO%: 0.3 % (ref 0.0–2.0)
EOS%: 0.9 % (ref 0.0–7.0)
MCH: 32.6 pg (ref 25.1–34.0)
MCHC: 33.7 g/dL (ref 31.5–36.0)
MONO#: 0.4 10*3/uL (ref 0.1–0.9)
NEUT%: 47.9 % (ref 38.4–76.8)
RBC: 4.14 10*6/uL (ref 3.70–5.45)
RDW: 14.4 % (ref 11.2–14.5)
WBC: 3.4 10*3/uL — ABNORMAL LOW (ref 3.9–10.3)
lymph#: 1.3 10*3/uL (ref 0.9–3.3)
nRBC: 0 % (ref 0–0)

## 2011-03-19 LAB — COMPREHENSIVE METABOLIC PANEL
ALT: 11 U/L (ref 0–35)
AST: 20 U/L (ref 0–37)
Albumin: 3.7 g/dL (ref 3.5–5.2)
Alkaline Phosphatase: 67 U/L (ref 39–117)
BUN: 13 mg/dL (ref 6–23)
Potassium: 3.7 mEq/L (ref 3.5–5.3)
Sodium: 142 mEq/L (ref 135–145)

## 2011-04-08 ENCOUNTER — Other Ambulatory Visit: Payer: Self-pay | Admitting: Physician Assistant

## 2011-04-08 DIAGNOSIS — C3490 Malignant neoplasm of unspecified part of unspecified bronchus or lung: Secondary | ICD-10-CM

## 2011-04-08 DIAGNOSIS — C801 Malignant (primary) neoplasm, unspecified: Secondary | ICD-10-CM | POA: Insufficient documentation

## 2011-04-08 DIAGNOSIS — C3491 Malignant neoplasm of unspecified part of right bronchus or lung: Secondary | ICD-10-CM | POA: Insufficient documentation

## 2011-04-09 ENCOUNTER — Other Ambulatory Visit: Payer: Self-pay | Admitting: Internal Medicine

## 2011-04-09 ENCOUNTER — Ambulatory Visit: Payer: Medicare Other

## 2011-04-09 ENCOUNTER — Ambulatory Visit (HOSPITAL_BASED_OUTPATIENT_CLINIC_OR_DEPARTMENT_OTHER): Payer: Medicare Other | Admitting: Physician Assistant

## 2011-04-09 ENCOUNTER — Other Ambulatory Visit (HOSPITAL_BASED_OUTPATIENT_CLINIC_OR_DEPARTMENT_OTHER): Payer: Medicare Other | Admitting: Lab

## 2011-04-09 VITALS — BP 125/79 | HR 66 | Temp 97.9°F | Ht 62.0 in | Wt 159.0 lb

## 2011-04-09 DIAGNOSIS — C3490 Malignant neoplasm of unspecified part of unspecified bronchus or lung: Secondary | ICD-10-CM

## 2011-04-09 DIAGNOSIS — Z5111 Encounter for antineoplastic chemotherapy: Secondary | ICD-10-CM

## 2011-04-09 DIAGNOSIS — C3430 Malignant neoplasm of lower lobe, unspecified bronchus or lung: Secondary | ICD-10-CM

## 2011-04-09 LAB — CBC WITH DIFFERENTIAL/PLATELET
BASO%: 0.2 % (ref 0.0–2.0)
Basophils Absolute: 0 10*3/uL (ref 0.0–0.1)
EOS%: 0.8 % (ref 0.0–7.0)
HGB: 12.7 g/dL (ref 11.6–15.9)
MCH: 32.5 pg (ref 25.1–34.0)
MCHC: 33.8 g/dL (ref 31.5–36.0)
MCV: 96.2 fL (ref 79.5–101.0)
MONO%: 15.1 % — ABNORMAL HIGH (ref 0.0–14.0)
NEUT%: 47.2 % (ref 38.4–76.8)
RDW: 14.4 % (ref 11.2–14.5)

## 2011-04-09 LAB — COMPREHENSIVE METABOLIC PANEL
Alkaline Phosphatase: 83 U/L (ref 39–117)
BUN: 13 mg/dL (ref 6–23)
Glucose, Bld: 85 mg/dL (ref 70–99)
Sodium: 138 mEq/L (ref 135–145)
Total Bilirubin: 0.5 mg/dL (ref 0.3–1.2)

## 2011-04-09 MED ORDER — SODIUM CHLORIDE 0.9 % IJ SOLN
10.0000 mL | INTRAMUSCULAR | Status: DC | PRN
  Administered 2011-04-09: 10 mL
  Filled 2011-04-09: qty 10

## 2011-04-09 MED ORDER — DEXAMETHASONE SODIUM PHOSPHATE 10 MG/ML IJ SOLN
10.0000 mg | Freq: Once | INTRAMUSCULAR | Status: AC
  Administered 2011-04-09: 10 mg via INTRAVENOUS

## 2011-04-09 MED ORDER — SODIUM CHLORIDE 0.9 % IV SOLN
500.0000 mg/m2 | Freq: Once | INTRAVENOUS | Status: AC
  Administered 2011-04-09: 900 mg via INTRAVENOUS
  Filled 2011-04-09: qty 36

## 2011-04-09 MED ORDER — ONDANSETRON 8 MG/50ML IVPB (CHCC)
8.0000 mg | Freq: Once | INTRAVENOUS | Status: AC
  Administered 2011-04-09: 8 mg via INTRAVENOUS

## 2011-04-09 MED ORDER — HEPARIN SOD (PORK) LOCK FLUSH 100 UNIT/ML IV SOLN
500.0000 [IU] | Freq: Once | INTRAVENOUS | Status: AC | PRN
  Administered 2011-04-09: 500 [IU]
  Filled 2011-04-09: qty 5

## 2011-04-09 MED ORDER — SODIUM CHLORIDE 0.9 % IV SOLN
Freq: Once | INTRAVENOUS | Status: AC
  Administered 2011-04-09: 14:00:00 via INTRAVENOUS

## 2011-04-09 NOTE — Progress Notes (Signed)
Hematology and Oncology Follow Up Visit  [name] [id number] 1932-09-15 77 y.o. 04/09/2011 5:24 PM  Principle Diagnosis: Recurrent non-small cell lung cancer, initially diagnosed a stage IB (T2, N0, M0) adenocarcinoma in June 2008 with a tumor size of 8 cm.   Prior Therapy: #1 status post left upper lobectomy with lymph node dissection under the care of Dr. Edwyna Shell on 06/29/2006. #2 status post 4 cycles of adjuvant chemotherapy with cisplatin and Taxotere last dose given 08/01/2006. #3 status post 6 cycles of systemic chemotherapy with carboplatin and Alimta for disease recurrence last dose given 12/12/2009.  Current therapy: Maintenance chemotherapy with Alimta at 500 mg per meter squared given every 3 weeks status post 21 cycles  Interim History:  Patient present for scheduled followup appointment to proceed with cycle #22 over maintenance chemotherapy with Alimta at 500 mg per meter squared given every 3 weeks she continues to tolerate this chemotherapy relatively well. She continues to have an occasional dry cough. She has constipation for 4 or 5 days after chemotherapy that spontaneously resolved. She does not requiring prescription refills today. We are currently doing her restaging CT scans every 4 months. She will next be due for restaging CT scan in December of 2012.  Medications: I have reviewed the patient's current medications.  Allergies:  Allergies  Allergen Reactions  . Ondansetron Hcl Other (See Comments)    Cluster migraines  . Simvastatin Other (See Comments)    Cluster migraines    Past Medical History, Surgical history, Social history, and Family History were reviewed and updated.  Review of Systems: Constitutional:  Negative for fever, chills, night sweats, anorexia, weight loss, pain. Cardiovascular: no chest pain or dyspnea on exertion Respiratory: positive for - cough Neurological: negative Dermatological: negative ENT: negative Skin Gastrointestinal:  no abdominal pain, change in bowel habits, or black or bloody stools Genito-Urinary: negative Hematological and Lymphatic: negative Breast: negative Musculoskeletal: negative Remaining ROS negative.  Physical Exam: Blood pressure 125/79, pulse 66, temperature 97.9 F (36.6 C), temperature source Oral, height 5\' 2"  (1.575 m), weight 159 lb (72.122 kg). ECOG:  General appearance: alert, cooperative, appears stated age and no distress Head: Normocephalic, without obvious abnormality, atraumatic Mouth:no evidence of thrush or mucositis Neck: no adenopathy, no carotid bruit, no JVD, supple, symmetrical, trachea midline and thyroid not enlarged, symmetric, no tenderness/mass/nodules Lymph nodes: Cervical, supraclavicular, and axillary nodes normal. Resp: clear to auscultation bilaterally Cardio: regular rate and rhythm, S1, S2 normal, no murmur, click, rub or gallop GI: soft, non-tender; bowel sounds normal; no masses,  no organomegaly Extremities: extremities normal, atraumatic, no cyanosis or edema    Lab Results: Lab Results  Component Value Date   WBC 5.1 08/14/2009   HGB 12.7 04/09/2011   HCT 37.6 04/09/2011   MCV 96.2 04/09/2011   PLT 241 04/09/2011     Chemistry      Component Value Date/Time   NA 142 03/19/2011 0904   K 3.7 03/19/2011 0904   CL 109 03/19/2011 0904   CO2 21 03/19/2011 0904   BUN 13 03/19/2011 0904   CREATININE 0.76 03/19/2011 0904      Component Value Date/Time   CALCIUM 8.6 03/19/2011 0904   ALKPHOS 67 03/19/2011 0904   AST 20 03/19/2011 0904   ALT 11 03/19/2011 0904   BILITOT 0.4 03/19/2011 6295       Radiological Studies: chest X-ray none  Impression and Plan: This is a very pleasant 77 year old white female with recurrent non-small cell lung cancer. She is  tolerating her maintenance chemotherapy with Alimta very well. Patient was discussed with Dr. Gwenyth Bouillon. She will proceed with her next scheduled cycle of maintenance hemotherapy with Alimta  as scheduled. She'll return in 3 weeks prior to her next cycle of maintenance chemotherapy with Alimta with a repeat CBC differential and CMET.  Spent more than half the time coordinating care.    Conni Slipper, [name] 11/6/20125:24 PM

## 2011-04-09 NOTE — Patient Instructions (Signed)
Pt discharged ambulatory with next appointment confirmed.  Pt aware to call with any questions or concerns.  

## 2011-04-12 ENCOUNTER — Other Ambulatory Visit: Payer: Self-pay | Admitting: Physician Assistant

## 2011-04-26 ENCOUNTER — Other Ambulatory Visit: Payer: Self-pay | Admitting: Physician Assistant

## 2011-04-30 ENCOUNTER — Ambulatory Visit (HOSPITAL_BASED_OUTPATIENT_CLINIC_OR_DEPARTMENT_OTHER): Payer: Medicare Other | Admitting: Physician Assistant

## 2011-04-30 ENCOUNTER — Other Ambulatory Visit: Payer: Self-pay | Admitting: Internal Medicine

## 2011-04-30 ENCOUNTER — Other Ambulatory Visit (HOSPITAL_BASED_OUTPATIENT_CLINIC_OR_DEPARTMENT_OTHER): Payer: Medicare Other | Admitting: Lab

## 2011-04-30 ENCOUNTER — Encounter: Payer: Self-pay | Admitting: Physician Assistant

## 2011-04-30 ENCOUNTER — Ambulatory Visit (HOSPITAL_BASED_OUTPATIENT_CLINIC_OR_DEPARTMENT_OTHER): Payer: Medicare Other

## 2011-04-30 VITALS — BP 120/79 | HR 68 | Temp 98.8°F | Ht 62.0 in | Wt 156.8 lb

## 2011-04-30 DIAGNOSIS — C801 Malignant (primary) neoplasm, unspecified: Secondary | ICD-10-CM

## 2011-04-30 DIAGNOSIS — C3490 Malignant neoplasm of unspecified part of unspecified bronchus or lung: Secondary | ICD-10-CM

## 2011-04-30 DIAGNOSIS — Z5111 Encounter for antineoplastic chemotherapy: Secondary | ICD-10-CM

## 2011-04-30 DIAGNOSIS — C3430 Malignant neoplasm of lower lobe, unspecified bronchus or lung: Secondary | ICD-10-CM

## 2011-04-30 LAB — CBC WITH DIFFERENTIAL/PLATELET
Basophils Absolute: 0 10*3/uL (ref 0.0–0.1)
Eosinophils Absolute: 0.1 10*3/uL (ref 0.0–0.5)
HCT: 38.9 % (ref 34.8–46.6)
HGB: 13.1 g/dL (ref 11.6–15.9)
LYMPH%: 40.4 % (ref 14.0–49.7)
MCV: 96.8 fL (ref 79.5–101.0)
MONO%: 12.3 % (ref 0.0–14.0)
NEUT#: 1.7 10*3/uL (ref 1.5–6.5)
NEUT%: 44.7 % (ref 38.4–76.8)
Platelets: 252 10*3/uL (ref 145–400)

## 2011-04-30 LAB — COMPREHENSIVE METABOLIC PANEL
CO2: 21 mEq/L (ref 19–32)
Creatinine, Ser: 0.74 mg/dL (ref 0.50–1.10)
Glucose, Bld: 98 mg/dL (ref 70–99)
Sodium: 140 mEq/L (ref 135–145)
Total Bilirubin: 0.6 mg/dL (ref 0.3–1.2)
Total Protein: 6.3 g/dL (ref 6.0–8.3)

## 2011-04-30 MED ORDER — SODIUM CHLORIDE 0.9 % IV SOLN
500.0000 mg/m2 | Freq: Once | INTRAVENOUS | Status: AC
  Administered 2011-04-30: 900 mg via INTRAVENOUS
  Filled 2011-04-30: qty 36

## 2011-04-30 MED ORDER — DEXAMETHASONE SODIUM PHOSPHATE 10 MG/ML IJ SOLN
10.0000 mg | Freq: Once | INTRAMUSCULAR | Status: AC
Start: 2011-04-30 — End: 2011-04-30
  Administered 2011-04-30: 10 mg via INTRAVENOUS

## 2011-04-30 MED ORDER — SODIUM CHLORIDE 0.9 % IV SOLN
Freq: Once | INTRAVENOUS | Status: AC
  Administered 2011-04-30: 13:00:00 via INTRAVENOUS

## 2011-04-30 MED ORDER — HEPARIN SOD (PORK) LOCK FLUSH 100 UNIT/ML IV SOLN
500.0000 [IU] | Freq: Once | INTRAVENOUS | Status: AC | PRN
  Administered 2011-04-30: 500 [IU]
  Filled 2011-04-30: qty 5

## 2011-04-30 MED ORDER — SODIUM CHLORIDE 0.9 % IJ SOLN
10.0000 mL | INTRAMUSCULAR | Status: DC | PRN
  Administered 2011-04-30: 10 mL
  Filled 2011-04-30: qty 10

## 2011-04-30 MED ORDER — ONDANSETRON 8 MG/50ML IVPB (CHCC)
8.0000 mg | Freq: Once | INTRAVENOUS | Status: AC
  Administered 2011-04-30: 8 mg via INTRAVENOUS

## 2011-04-30 NOTE — Progress Notes (Signed)
Hematology and Oncology Follow Up Visit  [name] [id number] Oct 02, 1932 77 y.o. 04/30/2011 12:23 PM  Principle Diagnosis: Recurrent non-small cell lung cancer, initially diagnosed a stage IB (T2, N0, M0) adenocarcinoma in June 2008 with a tumor size of 8 cm.   Prior Therapy: #1 status post left upper lobectomy with lymph node dissection under the care of Dr. Edwyna Shell on 06/29/2006. #2 status post 4 cycles of adjuvant chemotherapy with cisplatin and Taxotere last dose given 08/01/2006. #3 status post 6 cycles of systemic chemotherapy with carboplatin and Alimta for disease recurrence last dose given 12/12/2009.  Current therapy: Maintenance chemotherapy with Alimta at 500 mg per meter squared given every 3 weeks status post 22 cycles  Interim History:  Patient present for scheduled followup appointment to proceed with cycle #23 over maintenance chemotherapy with Alimta at 500 mg per meter squared given every 3 weeks she continues to tolerate this chemotherapy relatively well. She continues to have an occasional dry cough. She has constipation for 4 or 5 days after chemotherapy that spontaneously resolved. She does not requiring prescription refills today. We are currently doing her restaging CT scans every 4 months. She will next be due for restaging CT scan in December of 2012.  Medications: I have reviewed the patient's current medications.  Allergies:  Allergies  Allergen Reactions  . Ondansetron Hcl Other (See Comments)    Cluster migraines  . Simvastatin Other (See Comments)    Cluster migraines    Past Medical History, Surgical history, Social history, and Family History were reviewed and updated.  Review of Systems: Constitutional:  Negative for fever, chills, night sweats, anorexia, weight loss, pain. Cardiovascular: no chest pain or dyspnea on exertion Respiratory: positive for - cough Neurological: negative Dermatological: negative ENT: negative Gastrointestinal:  constipation for a few days after chemotherapy Genito-Urinary: negative Hematological and Lymphatic: negative Breast: negative Musculoskeletal: negative Remaining ROS negative.  Physical Exam: Blood pressure 120/79, pulse 68, temperature 98.8 F (37.1 C), temperature source Oral, height 5\' 2"  (1.575 m), weight 156 lb 12.8 oz (71.124 kg). ECOG:  General appearance: alert, cooperative, appears stated age and no distress Head: Normocephalic, without obvious abnormality, atraumatic Mouth:no evidence of thrush or mucositis Neck: no adenopathy, no carotid bruit, no JVD, supple, symmetrical, trachea midline and thyroid not enlarged, symmetric, no tenderness/mass/nodules Lymph nodes: Cervical, supraclavicular, and axillary nodes normal. Resp: clear to auscultation bilaterally Cardio: regular rate and rhythm, S1, S2 normal, no murmur, click, rub or gallop GI: soft, non-tender; bowel sounds normal; no masses,  no organomegaly Extremities: extremities normal, atraumatic, no cyanosis or edema    Lab Results: Lab Results  Component Value Date   WBC 3.7* 04/30/2011   HGB 13.1 04/30/2011   HCT 38.9 04/30/2011   MCV 96.8 04/30/2011   PLT 252 04/30/2011     Chemistry      Component Value Date/Time   NA 138 04/09/2011 1204   K 4.1 04/09/2011 1204   CL 105 04/09/2011 1204   CO2 22 04/09/2011 1204   BUN 13 04/09/2011 1204   CREATININE 0.63 04/09/2011 1204      Component Value Date/Time   CALCIUM 8.6 04/09/2011 1204   ALKPHOS 83 04/09/2011 1204   AST 18 04/09/2011 1204   ALT 10 04/09/2011 1204   BILITOT 0.5 04/09/2011 1204       Radiological Studies: chest X-ray none  Impression and Plan: This is a very pleasant 77 year old white female with recurrent non-small cell lung cancer. She is tolerating her maintenance chemotherapy  with Alimta very well. Patient was discussed with Dr. Gwenyth Bouillon. She will proceed with her next scheduled cycle of maintenance hemotherapy with Alimta as scheduled.  She'll return in 3 weeks to see Dr. Theodosia Quay to her next cycle of maintenance chemotherapy with Alimta with a repeat CBC differential and CMET and a restaging CT of the chest, abdomen, and pelvis without contrast to reevaluate her disease.     Conni Slipper, [name] 11/27/201212:23 PM

## 2011-05-16 ENCOUNTER — Ambulatory Visit (HOSPITAL_COMMUNITY)
Admission: RE | Admit: 2011-05-16 | Discharge: 2011-05-16 | Disposition: A | Discharge status: Home / Self Care | Payer: Medicare Other | Source: Ambulatory Visit | Attending: Physician Assistant | Admitting: Physician Assistant

## 2011-05-16 DIAGNOSIS — I2510 Atherosclerotic heart disease of native coronary artery without angina pectoris: Secondary | ICD-10-CM | POA: Insufficient documentation

## 2011-05-16 DIAGNOSIS — Z902 Acquired absence of lung [part of]: Secondary | ICD-10-CM | POA: Insufficient documentation

## 2011-05-16 DIAGNOSIS — N9489 Other specified conditions associated with female genital organs and menstrual cycle: Secondary | ICD-10-CM | POA: Insufficient documentation

## 2011-05-16 DIAGNOSIS — M5144 Schmorl's nodes, thoracic region: Secondary | ICD-10-CM | POA: Insufficient documentation

## 2011-05-16 DIAGNOSIS — N289 Disorder of kidney and ureter, unspecified: Secondary | ICD-10-CM | POA: Insufficient documentation

## 2011-05-16 DIAGNOSIS — C3490 Malignant neoplasm of unspecified part of unspecified bronchus or lung: Secondary | ICD-10-CM

## 2011-05-16 DIAGNOSIS — Z79899 Other long term (current) drug therapy: Secondary | ICD-10-CM | POA: Insufficient documentation

## 2011-05-21 ENCOUNTER — Ambulatory Visit: Payer: Medicare Other

## 2011-05-21 ENCOUNTER — Ambulatory Visit (HOSPITAL_BASED_OUTPATIENT_CLINIC_OR_DEPARTMENT_OTHER): Payer: Medicare Other | Admitting: Internal Medicine

## 2011-05-21 ENCOUNTER — Other Ambulatory Visit (HOSPITAL_BASED_OUTPATIENT_CLINIC_OR_DEPARTMENT_OTHER): Payer: Medicare Other | Admitting: Lab

## 2011-05-21 VITALS — BP 132/86 | HR 74 | Temp 97.1°F | Ht 62.0 in | Wt 154.2 lb

## 2011-05-21 DIAGNOSIS — C3490 Malignant neoplasm of unspecified part of unspecified bronchus or lung: Secondary | ICD-10-CM

## 2011-05-21 DIAGNOSIS — Z09 Encounter for follow-up examination after completed treatment for conditions other than malignant neoplasm: Secondary | ICD-10-CM

## 2011-05-21 LAB — CBC WITH DIFFERENTIAL/PLATELET
BASO%: 1.1 % (ref 0.0–2.0)
Basophils Absolute: 0 10*3/uL (ref 0.0–0.1)
EOS%: 1.6 % (ref 0.0–7.0)
HCT: 38.6 % (ref 34.8–46.6)
HGB: 13 g/dL (ref 11.6–15.9)
LYMPH%: 41.5 % (ref 14.0–49.7)
MCH: 32.6 pg (ref 25.1–34.0)
MCHC: 33.7 g/dL (ref 31.5–36.0)
MCV: 96.7 fL (ref 79.5–101.0)
MONO%: 14.8 % — ABNORMAL HIGH (ref 0.0–14.0)
NEUT%: 41 % (ref 38.4–76.8)
Platelets: 262 10*3/uL (ref 145–400)
lymph#: 1.5 10*3/uL (ref 0.9–3.3)

## 2011-05-21 NOTE — Progress Notes (Signed)
[facility] OFFICE PROGRESS NOTE  Principle Diagnosis: Recurrent non-small cell lung cancer, initially diagnosed a stage IB (T2, N0, M0) adenocarcinoma in June 2008 with a tumor size of 8 cm.   Prior Therapy: #1 status post left upper lobectomy with lymph node dissection under the care of Dr. Edwyna Shell on 06/29/2006. #2 status post 4 cycles of adjuvant chemotherapy with cisplatin and Taxotere last dose given 08/01/2006. #3 status post 6 cycles of systemic chemotherapy with carboplatin and Alimta for disease recurrence last dose given 12/12/2009.   Current therapy: Maintenance chemotherapy with Alimta at 500 mg per meter squared given every 3 weeks status post 23 cycles.   INTERVAL HISTORY: [name] 77 y.o. female returns to the clinic today for followup visit. The patient related the last cycle of her chemotherapy fairly well with no significant complaints. She has no significant chest pain or shortness breath, no cough or hemoptysis, no nausea or vomiting, no weight loss or night sweats. The patient has repeat CT scan of the chest abdomen and pelvis performed recently and she is here for for evaluation and discussion of her scan results.   MEDICAL HISTORY: Past Medical History  Diagnosis Date  . lung ca dx'd 06/2006    rt and lt lung  . History of migraine headaches   . Hypercholesterolemia   . Lung cancer     ALLERGIES:  is allergic to ondansetron hcl and simvastatin.  MEDICATIONS:  Current Outpatient Prescriptions  Medication Sig Dispense Refill  . Calcium Carbonate-Vit D-Min (CALTRATE PLUS PO) Take 500 mg by mouth 2 (two) times daily.        . fish oil-omega-3 fatty acids 1000 MG capsule Take 2 g by mouth daily.        . folic acid (FOLVITE) 1 MG tablet Take 1 mg by mouth daily.        . Multiple Vitamins-Minerals (CENTRUM SILVER PO) Take 1 tablet by mouth daily.        . prochlorperazine (COMPAZINE) 10 MG tablet Take 10 mg by mouth every 6 (six) hours as needed.         . ranitidine (ZANTAC) 75 MG tablet Take 75 mg by mouth. Pt takes about 3 times a week prn       . Thiamine HCl (VITAMIN B-1) 100 MG tablet Take by mouth daily.          REVIEW OF SYSTEMS:  A comprehensive review of systems was negative.   PHYSICAL EXAMINATION: General appearance: alert, cooperative and no distress Head: Normocephalic, without obvious abnormality, atraumatic Neck: no adenopathy Lymph nodes: Cervical, supraclavicular, and axillary nodes normal. Resp: clear to auscultation bilaterally Cardio: regular rate and rhythm, S1, S2 normal, no murmur, click, rub or gallop GI: soft, non-tender; bowel sounds normal; no masses,  no organomegaly Extremities: extremities normal, atraumatic, no cyanosis or edema Neurologic: Alert and oriented X 3, normal strength and tone. Normal symmetric reflexes. Normal coordination and gait  ECOG PERFORMANCE STATUS: 0 - Asymptomatic  Blood pressure 132/86, pulse 74, temperature 97.1 F (36.2 C), temperature source Oral, height 5\' 2"  (1.575 m), weight 154 lb 3.2 oz (69.945 kg).  LABORATORY DATA: Lab Results  Component Value Date   WBC 3.7* 05/21/2011   HGB 13.0 05/21/2011   HCT 38.6 05/21/2011   MCV 96.7 05/21/2011   PLT 262 05/21/2011      Chemistry      Component Value Date/Time   NA 140 04/30/2011 1125   K 3.8 04/30/2011 1125  CL 107 04/30/2011 1125   CO2 21 04/30/2011 1125   BUN 16 04/30/2011 1125   CREATININE 0.74 04/30/2011 1125      Component Value Date/Time   CALCIUM 9.0 04/30/2011 1125   ALKPHOS 74 04/30/2011 1125   AST 21 04/30/2011 1125   ALT 10 04/30/2011 1125   BILITOT 0.6 04/30/2011 1125       RADIOGRAPHIC STUDIES: Ct Abdomen Pelvis Wo Contrast  05/16/2011  *RADIOLOGY REPORT*  Clinical Data:  Restaging lung cancer.  Chemotherapy in progress.  CT CHEST, ABDOMEN AND PELVIS WITHOUT CONTRAST  Technique:  Multidetector CT imaging of the chest, abdomen and pelvis was performed following the standard protocol  without IV contrast.  Comparison:  02/01/2011.   CT CHEST  Findings:  No pathologically enlarged mediastinal or axillary lymph nodes.  Hilar regions are difficult to definitively evaluate without IV contrast.  Heart size normal.  Coronary artery calcification.  No pericardial effusion.  Mild biapical pleural parenchymal scarring.  Postoperative changes of left lower lobectomy.  Irregular peribronchovascular airspace densities in the right upper and right middle lobes appear unchanged from 02/01/2011.  Probable subpleural scarring in the medial right lower lobe.  Tiny subpleural nodule (4 mm) in the left upper lobe (image 20) is unchanged. No pleural fluid.  Airway is otherwise unremarkable.  IMPRESSION:  1.  Irregular peribronchovascular nodular air space densities in the right upper and right middle lobes are stable. 2.  Postoperative changes of left lower lobectomy.    CT ABDOMEN AND PELVIS  Findings:  Liver, gallbladder and adrenal glands are unremarkable. A 9 mm low attenuation lesion in the interpolar right kidney is unchanged.  Kidneys, spleen, pancreas, stomach and small bowel are otherwise unremarkable.  Stool is seen in the majority of the colon.  A low attenuation lesion in the right adnexa measures 2.7 x 4.0 cm, stable.  No pathologically enlarged lymph nodes.  No free fluid.  Scattered atherosclerotic calcification of the arterial vasculature without aneurysm.  No worrisome lytic or sclerotic lesions.  Prominent Schmorl's nodes are seen in the thoracolumbar spine.  IMPRESSION:  1.  No evidence of metastatic disease in the abdomen or pelvis. 2.  Probable constipation. 3.  Low attenuation right adnexal lesion, stable.  Original Report Authenticated By: Reyes Ivan, M.D.     ASSESSMENT: Ms. a very pleasant 77 years old white female with recurrent non-small cell lung cancer status post systemic chemotherapy with carboplatin and Alimta followed by maintenance Alimta for 23 cycles. The patient is  doing fine and tolerating her treatment fairly well. She has no evidence for disease progression on his recent scan. I discussed the scan results with the patient.  PLAN: I recommend for the patient will continue with her maintenance treatment, but that [name] would like to take a break from treatment to enjoy the holiday season. I would see her back for followup visit in 3 months with repeat CT scan of the chest abdomen and pelvis for restaging of her disease. Our plan would be to treat the patient only if she has evidence for disease progression.   All questions were answered. The patient knows to call the clinic with any problems, questions or concerns. We can certainly see the patient much sooner if necessary.

## 2011-05-23 ENCOUNTER — Telehealth: Payer: Self-pay | Admitting: Internal Medicine

## 2011-05-23 ENCOUNTER — Other Ambulatory Visit: Payer: Self-pay | Admitting: *Deleted

## 2011-05-23 DIAGNOSIS — C3490 Malignant neoplasm of unspecified part of unspecified bronchus or lung: Secondary | ICD-10-CM

## 2011-05-23 NOTE — Telephone Encounter (Signed)
Talked to pt, gave her appt date for lab,CT and MD visit

## 2011-05-23 NOTE — Telephone Encounter (Signed)
Talked to pt again today, gave her appt for CT without contrast, same day lab draw. Pt will see MD on 12/20, pt aware of all appts

## 2011-06-04 HISTORY — PX: CATARACT EXTRACTION: SUR2

## 2011-08-14 ENCOUNTER — Other Ambulatory Visit: Payer: Medicare Other | Admitting: Lab

## 2011-08-14 ENCOUNTER — Ambulatory Visit (HOSPITAL_COMMUNITY)
Admission: RE | Admit: 2011-08-14 | Discharge: 2011-08-14 | Disposition: A | Discharge status: Home / Self Care | Payer: Medicare Other | Source: Ambulatory Visit | Attending: Internal Medicine | Admitting: Internal Medicine

## 2011-08-14 DIAGNOSIS — I2510 Atherosclerotic heart disease of native coronary artery without angina pectoris: Secondary | ICD-10-CM | POA: Insufficient documentation

## 2011-08-14 DIAGNOSIS — C3490 Malignant neoplasm of unspecified part of unspecified bronchus or lung: Secondary | ICD-10-CM

## 2011-08-14 DIAGNOSIS — J984 Other disorders of lung: Secondary | ICD-10-CM | POA: Insufficient documentation

## 2011-08-14 DIAGNOSIS — N9489 Other specified conditions associated with female genital organs and menstrual cycle: Secondary | ICD-10-CM | POA: Insufficient documentation

## 2011-08-20 ENCOUNTER — Other Ambulatory Visit: Payer: Medicare Other | Admitting: Lab

## 2011-08-20 ENCOUNTER — Other Ambulatory Visit (HOSPITAL_COMMUNITY): Payer: Medicare Other

## 2011-08-21 ENCOUNTER — Telehealth: Payer: Self-pay | Admitting: Internal Medicine

## 2011-08-21 ENCOUNTER — Other Ambulatory Visit (HOSPITAL_BASED_OUTPATIENT_CLINIC_OR_DEPARTMENT_OTHER): Payer: Medicare Other | Admitting: Lab

## 2011-08-21 ENCOUNTER — Ambulatory Visit (HOSPITAL_BASED_OUTPATIENT_CLINIC_OR_DEPARTMENT_OTHER): Payer: Medicare Other | Admitting: Internal Medicine

## 2011-08-21 VITALS — BP 131/78 | HR 70 | Temp 97.2°F | Ht 62.0 in | Wt 151.3 lb

## 2011-08-21 DIAGNOSIS — C3430 Malignant neoplasm of lower lobe, unspecified bronchus or lung: Secondary | ICD-10-CM

## 2011-08-21 DIAGNOSIS — C3490 Malignant neoplasm of unspecified part of unspecified bronchus or lung: Secondary | ICD-10-CM

## 2011-08-21 LAB — CBC WITH DIFFERENTIAL/PLATELET
Basophils Absolute: 0 10*3/uL (ref 0.0–0.1)
Eosinophils Absolute: 0 10*3/uL (ref 0.0–0.5)
HCT: 41.4 % (ref 34.8–46.6)
HGB: 13.8 g/dL (ref 11.6–15.9)
MCV: 96 fL (ref 79.5–101.0)
NEUT#: 2.3 10*3/uL (ref 1.5–6.5)
NEUT%: 53.3 % (ref 38.4–76.8)
RDW: 13.2 % (ref 11.2–14.5)
lymph#: 1.7 10*3/uL (ref 0.9–3.3)

## 2011-08-21 LAB — COMPREHENSIVE METABOLIC PANEL
AST: 17 U/L (ref 0–37)
Albumin: 4 g/dL (ref 3.5–5.2)
Alkaline Phosphatase: 87 U/L (ref 39–117)
BUN: 11 mg/dL (ref 6–23)
Creatinine, Ser: 0.81 mg/dL (ref 0.50–1.10)
Glucose, Bld: 89 mg/dL (ref 70–99)
Potassium: 4.1 mEq/L (ref 3.5–5.3)

## 2011-08-21 NOTE — Telephone Encounter (Signed)
gve the pt her June 2013 appt calendar along with the ct scan appt. Pt will arrive two hrs earlier to drink the water based oral contrast

## 2011-08-21 NOTE — Progress Notes (Signed)
[facility] Telephone:([phone]) [phone]   Fax:([phone]  OFFICE PROGRESS NOTE  [name],[name], MD, MD [address] [place] [address]  Principle Diagnosis: Recurrent non-small cell lung cancer, initially diagnosed a stage IB (T2, N0, M0) adenocarcinoma in June 2008 with a tumor size of 8 cm.   Prior Therapy:  #1 status post left upper lobectomy with lymph node dissection under the care of Dr. Edwyna Shell on 06/29/2006.  #2 status post 4 cycles of adjuvant chemotherapy with cisplatin and Taxotere last dose given 08/01/2006.  #3 status post 6 cycles of systemic chemotherapy with carboplatin and Alimta for disease recurrence last dose given 12/12/2009.  #4 Maintenance chemotherapy with Alimta at 500 mg per meter squared given every 3 weeks status post 23 cycles.  Current therapy: Observation  INTERVAL HISTORY: CRISTIE MCKINNEY 78 y.o. female returns to the clinic today for routine three-month followup visit. The patient has no complaints today. She denied having any significant weight loss or night sweats. He has no chest pain or shortness of breath, no cough or hemoptysis. No significant nausea or vomiting. She has repeat CT scan of the chest, abdomen and pelvis performed recently and she is here today for evaluation and discussion of her scan results.  MEDICAL HISTORY: Past Medical History  Diagnosis Date  . lung ca dx'd 06/2006    rt and lt lung  . History of migraine headaches   . Hypercholesterolemia   . Lung cancer     ALLERGIES:  is allergic to ondansetron hcl and simvastatin.  MEDICATIONS:  Current Outpatient Prescriptions  Medication Sig Dispense Refill  . Calcium Carbonate-Vit D-Min (CALTRATE PLUS PO) Take 500 mg by mouth 2 (two) times daily.        . fish oil-omega-3 fatty acids 1000 MG capsule Take 2 g by mouth daily.        . folic acid (FOLVITE) 1 MG tablet Take 1 mg by mouth daily.        . Multiple Vitamins-Minerals (CENTRUM  SILVER PO) Take 1 tablet by mouth daily.        . prochlorperazine (COMPAZINE) 10 MG tablet Take 10 mg by mouth every 6 (six) hours as needed.        . ranitidine (ZANTAC) 75 MG tablet Take 75 mg by mouth. Pt takes about 3 times a week prn       . Thiamine HCl (VITAMIN B-1) 100 MG tablet Take by mouth daily.          REVIEW OF SYSTEMS:  A comprehensive review of systems was negative.   PHYSICAL EXAMINATION: General appearance: alert, cooperative and no distress Neck: no adenopathy Lymph nodes: Cervical, supraclavicular, and axillary nodes normal. Resp: clear to auscultation bilaterally Cardio: regular rate and rhythm, S1, S2 normal, no murmur, click, rub or gallop GI: soft, non-tender; bowel sounds normal; no masses,  no organomegaly Extremities: extremities normal, atraumatic, no cyanosis or edema Neurologic: Alert and oriented X 3, normal strength and tone. Normal symmetric reflexes. Normal coordination and gait  ECOG PERFORMANCE STATUS: 0 - Asymptomatic  Blood pressure 131/78, pulse 70, temperature 97.2 F (36.2 C), temperature source Oral, height 5\' 2"  (1.575 m), weight 151 lb 4.8 oz (68.629 kg).  LABORATORY DATA: Lab Results  Component Value Date   WBC 3.7* 05/21/2011   HGB 13.0 05/21/2011   HCT 38.6 05/21/2011   MCV 96.7 05/21/2011   PLT 262 05/21/2011      Chemistry  Component Value Date/Time   NA 140 04/30/2011 1125   K 3.8 04/30/2011 1125   CL 107 04/30/2011 1125   CO2 21 04/30/2011 1125   BUN 16 04/30/2011 1125   CREATININE 0.74 04/30/2011 1125      Component Value Date/Time   CALCIUM 9.0 04/30/2011 1125   ALKPHOS 74 04/30/2011 1125   AST 21 04/30/2011 1125   ALT 10 04/30/2011 1125   BILITOT 0.6 04/30/2011 1125       RADIOGRAPHIC STUDIES: Ct Abdomen Pelvis Wo Contrast  08/14/2011  *RADIOLOGY REPORT*  Clinical Data:  Lung cancer.  CT CHEST, ABDOMEN AND PELVIS WITHOUT CONTRAST  Technique:  Multidetector CT imaging of the chest, abdomen and pelvis was  performed following the standard protocol without IV contrast.  Comparison:  05/16/2011, 07/27/2010 and 07/27/2009.   CT CHEST  Findings:  No pathologically enlarged mediastinal or axillary lymph nodes.  Hilar regions are difficult to definitively evaluate without IV contrast.  Coronary artery calcification.  Heart size normal.  No pericardial effusion.  Mild biapical pleural parenchymal scarring. Peribronchovascular airspace nodules are again seen in the right upper lobe, measuring up to 2.1 x 1.3 cm, stable from 07/27/2009.  Additional irregular airspace nodules are seen in the right middle lobe, also stable from 07/27/2009.  Postoperative changes of left lower lobectomy.  4 mm subpleural left upper lobe nodule (image 23), unchanged from 07/27/2009. No pleural fluid.  Airway is unremarkable.  IMPRESSION: Right upper and right middle lobe airspace nodules are unchanged from 07/27/2009.   CT ABDOMEN AND PELVIS  Findings:  Liver, gallbladder and adrenal glands unremarkable.  A 9 mm low attenuation lesion off the right kidney is unchanged. Kidneys, spleen, pancreas, stomach and bowel are otherwise unremarkable.  A 3.9 x 3.1 cm low attenuation lesion in the right adnexa is stable.  No pathologically enlarged lymph nodes.  No free fluid. No worrisome lytic or sclerotic lesions. Prominent Schmorl's nodes are seen in the spine.  IMPRESSION:  No evidence of metastatic disease in the abdomen or pelvis.  Original Report Authenticated By: Reyes Ivan, M.D.    ASSESSMENT: This is a very pleasant 78 years old white female with history of recurrent non-small cell lung cancer status post systemic chemotherapy with carboplatin and Alimta followed by maintenance Alimta for 23 cycles and the patient is currently on observation. She has no evidence for disease progression on his recent scan.  PLAN: I discussed the scan results with the patient and recommended for her continuous observation for now with repeat CT scan of  the chest, abdomen and pelvis in 3 months. She would come back for followup visit at that time. She was advised to call me immediately if she has any concerning symptoms in the interval.  All questions were answered. The patient knows to call the clinic with any problems, questions or concerns. We can certainly see the patient much sooner if necessary.

## 2011-10-24 ENCOUNTER — Telehealth: Payer: Self-pay | Admitting: Medical Oncology

## 2011-10-24 ENCOUNTER — Telehealth: Payer: Self-pay | Admitting: Internal Medicine

## 2011-10-24 ENCOUNTER — Other Ambulatory Visit: Payer: Self-pay | Admitting: Medical Oncology

## 2011-10-24 DIAGNOSIS — C3490 Malignant neoplasm of unspecified part of unspecified bronchus or lung: Secondary | ICD-10-CM

## 2011-10-24 NOTE — Telephone Encounter (Signed)
-  Needs to cancel upcoming 3 month f/u in June due to cataract surgery and asks that we please r/s lab, scan and f/u  for end of August.  I told her Dr Donnald Garre wants a 3 month f/u and she understands , but she insists on r/s all her appointments for late August. I sent scheduling request

## 2011-10-24 NOTE — Telephone Encounter (Signed)
called pt and she is aware of her aug. appts   aom

## 2011-11-20 ENCOUNTER — Other Ambulatory Visit (HOSPITAL_COMMUNITY): Payer: Medicare Other

## 2011-11-20 ENCOUNTER — Other Ambulatory Visit: Payer: Medicare Other | Admitting: Lab

## 2011-11-25 ENCOUNTER — Ambulatory Visit: Payer: Medicare Other | Admitting: Internal Medicine

## 2011-12-23 ENCOUNTER — Telehealth: Payer: Self-pay | Admitting: Medical Oncology

## 2011-12-23 DIAGNOSIS — C3490 Malignant neoplasm of unspecified part of unspecified bronchus or lung: Secondary | ICD-10-CM

## 2011-12-23 NOTE — Telephone Encounter (Signed)
Ct head ordered and schedule request sent to move up scans

## 2011-12-23 NOTE — Telephone Encounter (Signed)
Dizzy and not walking straight x two days. Some nausea too.  Concerned that cancer may metastasized. Had second cataract surgery July 1st . I told pt to contact PCP but she wanted to hear what Dr Arbutus Ped recommended.

## 2011-12-24 ENCOUNTER — Encounter: Payer: Self-pay | Admitting: Medical Oncology

## 2011-12-24 ENCOUNTER — Telehealth: Payer: Self-pay | Admitting: Medical Oncology

## 2011-12-24 NOTE — Telephone Encounter (Signed)
States she saw her PCP who diagnosed her with vertigo. She took Meclizine 12.5 mg and feels better today. She does not want brain scan and wants to keep appointments for august. I send updated POF

## 2011-12-25 ENCOUNTER — Telehealth: Payer: Self-pay | Admitting: *Deleted

## 2011-12-25 NOTE — Telephone Encounter (Signed)
looks like ct of the head has been cancelled per orders will leave scan for 01-2012

## 2012-01-16 ENCOUNTER — Other Ambulatory Visit: Payer: Self-pay | Admitting: *Deleted

## 2012-01-16 DIAGNOSIS — C3490 Malignant neoplasm of unspecified part of unspecified bronchus or lung: Secondary | ICD-10-CM

## 2012-01-24 ENCOUNTER — Telehealth: Payer: Self-pay | Admitting: *Deleted

## 2012-01-24 ENCOUNTER — Other Ambulatory Visit (HOSPITAL_BASED_OUTPATIENT_CLINIC_OR_DEPARTMENT_OTHER): Payer: Medicare Other | Admitting: Lab

## 2012-01-24 ENCOUNTER — Other Ambulatory Visit: Payer: Self-pay | Admitting: Internal Medicine

## 2012-01-24 ENCOUNTER — Ambulatory Visit (HOSPITAL_COMMUNITY)
Admission: RE | Admit: 2012-01-24 | Discharge: 2012-01-24 | Disposition: A | Discharge status: Home / Self Care | Payer: Medicare Other | Source: Ambulatory Visit | Attending: Internal Medicine | Admitting: Internal Medicine

## 2012-01-24 DIAGNOSIS — J984 Other disorders of lung: Secondary | ICD-10-CM | POA: Insufficient documentation

## 2012-01-24 DIAGNOSIS — R05 Cough: Secondary | ICD-10-CM | POA: Insufficient documentation

## 2012-01-24 DIAGNOSIS — C3490 Malignant neoplasm of unspecified part of unspecified bronchus or lung: Secondary | ICD-10-CM | POA: Insufficient documentation

## 2012-01-24 DIAGNOSIS — N9489 Other specified conditions associated with female genital organs and menstrual cycle: Secondary | ICD-10-CM | POA: Insufficient documentation

## 2012-01-24 DIAGNOSIS — Z90710 Acquired absence of both cervix and uterus: Secondary | ICD-10-CM | POA: Insufficient documentation

## 2012-01-24 DIAGNOSIS — R059 Cough, unspecified: Secondary | ICD-10-CM | POA: Insufficient documentation

## 2012-01-24 LAB — CBC WITH DIFFERENTIAL/PLATELET
Basophils Absolute: 0 10*3/uL (ref 0.0–0.1)
EOS%: 1.2 % (ref 0.0–7.0)
Eosinophils Absolute: 0.1 10*3/uL (ref 0.0–0.5)
HGB: 13.6 g/dL (ref 11.6–15.9)
LYMPH%: 36 % (ref 14.0–49.7)
MCH: 32.9 pg (ref 25.1–34.0)
MCV: 96.4 fL (ref 79.5–101.0)
MONO%: 8.4 % (ref 0.0–14.0)
NEUT#: 2.2 10*3/uL (ref 1.5–6.5)
NEUT%: 53.7 % (ref 38.4–76.8)
Platelets: 210 10*3/uL (ref 145–400)

## 2012-01-24 LAB — CMP (CANCER CENTER ONLY)
Albumin: 3.6 g/dL (ref 3.3–5.5)
Alkaline Phosphatase: 79 U/L (ref 26–84)
BUN, Bld: 13 mg/dL (ref 7–22)
Creat: 0.8 mg/dl (ref 0.6–1.2)
Glucose, Bld: 92 mg/dL (ref 73–118)
Total Bilirubin: 0.9 mg/dl (ref 0.20–1.60)

## 2012-01-24 NOTE — Telephone Encounter (Signed)
Patient has order for CT with contrast and has not had contrast since 2010.  Patient says she can't have contrast.  Will leave message for providers of patient not wanting to receive contrast.  Dr. Arbutus Ped to return to office on 01-27-2012.

## 2012-01-27 ENCOUNTER — Ambulatory Visit (HOSPITAL_BASED_OUTPATIENT_CLINIC_OR_DEPARTMENT_OTHER): Payer: Medicare Other | Admitting: Internal Medicine

## 2012-01-27 ENCOUNTER — Telehealth: Payer: Self-pay | Admitting: Internal Medicine

## 2012-01-27 VITALS — BP 125/79 | HR 74 | Temp 96.9°F | Resp 20 | Ht 62.0 in | Wt 152.2 lb

## 2012-01-27 DIAGNOSIS — C3410 Malignant neoplasm of upper lobe, unspecified bronchus or lung: Secondary | ICD-10-CM

## 2012-01-27 DIAGNOSIS — E7800 Pure hypercholesterolemia, unspecified: Secondary | ICD-10-CM

## 2012-01-27 DIAGNOSIS — C3430 Malignant neoplasm of lower lobe, unspecified bronchus or lung: Secondary | ICD-10-CM

## 2012-01-27 DIAGNOSIS — C3490 Malignant neoplasm of unspecified part of unspecified bronchus or lung: Secondary | ICD-10-CM

## 2012-01-27 MED ORDER — DEXAMETHASONE 4 MG PO TABS: 4.0000 mg | ORAL_TABLET | ORAL | Status: DC

## 2012-01-27 MED ORDER — CYANOCOBALAMIN 1000 MCG/ML IJ SOLN
1000.0000 ug | Freq: Once | INTRAMUSCULAR | Status: AC
  Administered 2012-01-27: 1000 ug via INTRAMUSCULAR

## 2012-01-27 NOTE — Patient Instructions (Signed)
Your CT scan of the chest showed worsening of her disease. You will need to resume systemic chemotherapy. Vitamin B12 injection today. Chemotherapy started next week with Alimta. Followup in 4 weeks with the next cycle of her chemotherapy.

## 2012-01-27 NOTE — Progress Notes (Signed)
[facility] [phone]   [phone]  OFFICE PROGRESS NOTE  [name],[name], MD [address] [place] [address]  Principle Diagnosis: Recurrent non-small cell lung cancer, initially diagnosed a stage IB (T2, N0, M0) adenocarcinoma in June 2008 with a tumor size of 8 cm.   Prior Therapy:  #1 status post left upper lobectomy with lymph node dissection under the care of Dr. Edwyna Shell on 06/29/2006.  #2 status post 4 cycles of adjuvant chemotherapy with cisplatin and Taxotere last dose given 08/01/2006.  #3 status post 6 cycles of systemic chemotherapy with carboplatin and Alimta for disease recurrence last dose given 12/12/2009.  #4 Maintenance chemotherapy with Alimta at 500 mg per meter squared given every 3 weeks status post 23 cycles.   Current therapy: Observation  INTERVAL HISTORY: [name] 78 y.o. female returns to the clinic today for a routine followup visit. The patient is feeling fine today with no specific complaints. She denied having any significant chest pain, shortness breath, cough or hemoptysis. She denied having any significant weight loss or night sweats. The patient had repeat CT scan of the chest, abdomen and pelvis for evaluation of her disease and she is here for discussion of her scan results and recommendation regarding treatment of her condition.  MEDICAL HISTORY: Past Medical History  Diagnosis Date  . lung ca dx'd 06/2006    rt and lt lung  . History of migraine headaches   . Hypercholesterolemia   . Lung cancer     ALLERGIES:  is allergic to ondansetron hcl; simvastatin; and iodinated diagnostic agents.  MEDICATIONS:  Current Outpatient Prescriptions  Medication Sig Dispense Refill  . Calcium Carbonate-Vit D-Min (CALTRATE PLUS PO) Take 500 mg by mouth 2 (two) times daily.        . folic acid (FOLVITE) 1 MG tablet Take 1 mg by mouth daily.        . Multiple Vitamins-Minerals (CENTRUM SILVER PO) Take 1  tablet by mouth daily.        . ranitidine (ZANTAC) 75 MG tablet Take 75 mg by mouth. Pt takes about 3 times a week prn       . fish oil-omega-3 fatty acids 1000 MG capsule Take 2 g by mouth daily.        . meclizine (ANTIVERT) 25 MG tablet Take 12.5 mg by mouth 3 (three) times daily as needed.      . prochlorperazine (COMPAZINE) 10 MG tablet Take 10 mg by mouth every 6 (six) hours as needed.        . Thiamine HCl (VITAMIN B-1) 100 MG tablet Take by mouth daily.         Current Facility-Administered Medications  Medication Dose Route Frequency Provider Last Rate Last Dose  . cyanocobalamin ((VITAMIN B-12)) injection 1,000 mcg  1,000 mcg Intramuscular Once Si Gaul, MD        REVIEW OF SYSTEMS:  A comprehensive review of systems was negative.   PHYSICAL EXAMINATION: General appearance: alert, cooperative and no distress Head: Normocephalic, without obvious abnormality, atraumatic Neck: no adenopathy Lymph nodes: Cervical, supraclavicular, and axillary nodes normal. Resp: clear to auscultation bilaterally Cardio: regular rate and rhythm, S1, S2 normal, no murmur, click, rub or gallop GI: soft, non-tender; bowel sounds normal; no masses,  no organomegaly Extremities: extremities normal, atraumatic, no cyanosis or edema Neurologic: Alert and oriented X 3, normal strength and tone. Normal symmetric reflexes. Normal coordination and gait  ECOG PERFORMANCE STATUS: 1 -  Symptomatic but completely ambulatory  Blood pressure 125/79, pulse 74, temperature 96.9 F (36.1 C), temperature source Oral, resp. rate 20, height 5\' 2"  (1.575 m), weight 152 lb 3.2 oz (69.037 kg).  LABORATORY DATA: Lab Results  Component Value Date   WBC 4.2 01/24/2012   HGB 13.6 01/24/2012   HCT 39.9 01/24/2012   MCV 96.4 01/24/2012   PLT 210 01/24/2012      Chemistry      Component Value Date/Time   NA 139 01/24/2012 1008   NA 140 08/21/2011 1306   K 4.2 01/24/2012 1008   K 4.1 08/21/2011 1306   CL 103  01/24/2012 1008   CL 107 08/21/2011 1306   CO2 28 01/24/2012 1008   CO2 22 08/21/2011 1306   BUN 13 01/24/2012 1008   BUN 11 08/21/2011 1306   CREATININE 0.8 01/24/2012 1008   CREATININE 0.81 08/21/2011 1306      Component Value Date/Time   CALCIUM 8.6 01/24/2012 1008   CALCIUM 8.8 08/21/2011 1306   ALKPHOS 79 01/24/2012 1008   ALKPHOS 87 08/21/2011 1306   AST 24 01/24/2012 1008   AST 17 08/21/2011 1306   ALT 8 08/21/2011 1306   BILITOT 0.90 01/24/2012 1008   BILITOT 0.6 08/21/2011 1306       RADIOGRAPHIC STUDIES:  Ct Chest Wo Contrast  01/24/2012  *RADIOLOGY REPORT*  Clinical Data:  Bilateral lung cancer.  Chemotherapy completed December 2013.  Cough.  CT CHEST, ABDOMEN AND PELVIS WITHOUT CONTRAST  Technique:  Multidetector CT imaging of the chest, abdomen and pelvis was performed following the standard protocol without IV contrast.  Comparison:  08/14/2011.   CT CHEST  Findings:  No pathologically enlarged mediastinal or axillary lymph nodes.  Hilar regions are difficult to definitively evaluate without IV contrast.  Heart size normal.  Coronary artery calcification.  Atherosclerotic calcification of the arterial vasculature, including coronary arteries.  No pericardial effusion.  Minimal biapical pleural parenchymal scarring.  A spiculated mass in the posterior segment right upper lobe measures 2.4 x 3.0 cm (previously 2.1 x 1.3 cm).  There are adjacent spiculated nodules in the anterior right upper lobe.  Right middle lobe nodules have increased in size as well, with index lesion measuring 9 x 11 mm (previously 6 x 10 mm) on image 43.  Subpleural ground-glass nodular density in the left upper lobe (image 33) is stable.  Postoperative changes of left lower lobectomy with associated scarring and mild volume loss.  4 mm subpleural left upper lobe nodule (image 22), stable.  No pleural fluid.  Airway is otherwise unremarkable.  IMPRESSION:  Interval progression of disease as evidenced by an enlarging right  upper lobe mass and adjacent spiculated nodules in the right upper lobe and right middle lobe.   CT ABDOMEN AND PELVIS  Findings:  Liver, gallbladder and adrenal glands are unremarkable. An 8 mm low attenuation lesion in the interpolar right kidney is stable in size.  Spleen, pancreas, stomach and bowel are unremarkable.  A 4.6 x 3.1 cm low attenuation lesion in the right adnexa is unchanged.  Hysterectomy.  No pathologically enlarged lymph nodes. No free fluid.  Scattered atherosclerotic calcification of the arterial vasculature without abdominal aortic aneurysm.  No worrisome lytic or sclerotic lesions. Prominent Schmorl's nodes along superior endplates in the lower thoracic spine.  There may be a mild compression deformity about the superior plate of L1, unchanged.  IMPRESSION:  1.  No evidence of metastatic disease in the abdomen or pelvis. 2.  Stable right adnexal low attenuation lesion.   Original Report Authenticated By: Reyes Ivan, M.D.     ASSESSMENT: This is a very pleasant 78 years old white female with recurrent non-small cell lung cancer status post systemic chemotherapy with carboplatin and Alimta followed by maintenance chemotherapy with single agent Alimta for 23 cycles that was discontinued based on the patient's wishes to take a break from the treatment. Unfortunately she has evidence for disease progression on his recent scan.  PLAN: I discussed the scan results and showed the images to the patient today. I recommended for her to resume systemic chemotherapy with Alimta at 500 mg/M2 every 3 weeks. I discussed with the patient adverse effect of this treatment including but not limited to alopecia, myelosuppression, nausea vomiting, liver or in dysfunction. The patient would like to proceed with treatment as planned and she is expected to start the first cycle of her treatment next week. She would come back for followup visit in 4 weeks with the start of cycle #2. The patient was  advised to call me immediately if she has any concerning symptoms in the interval.  All questions were answered. The patient knows to call the clinic with any problems, questions or concerns. We can certainly see the patient much sooner if necessary.  I spent 15 minutes counseling the patient face to face. The total time spent in the appointment was 25 minutes.

## 2012-01-27 NOTE — Telephone Encounter (Signed)
gv pt appt schedule for September thru October.  °

## 2012-02-06 ENCOUNTER — Ambulatory Visit (HOSPITAL_BASED_OUTPATIENT_CLINIC_OR_DEPARTMENT_OTHER): Payer: Medicare Other

## 2012-02-06 ENCOUNTER — Other Ambulatory Visit (HOSPITAL_BASED_OUTPATIENT_CLINIC_OR_DEPARTMENT_OTHER): Payer: Medicare Other

## 2012-02-06 VITALS — BP 128/61 | HR 61 | Temp 98.2°F

## 2012-02-06 DIAGNOSIS — C801 Malignant (primary) neoplasm, unspecified: Secondary | ICD-10-CM

## 2012-02-06 DIAGNOSIS — C3430 Malignant neoplasm of lower lobe, unspecified bronchus or lung: Secondary | ICD-10-CM

## 2012-02-06 DIAGNOSIS — C3490 Malignant neoplasm of unspecified part of unspecified bronchus or lung: Secondary | ICD-10-CM

## 2012-02-06 DIAGNOSIS — C3410 Malignant neoplasm of upper lobe, unspecified bronchus or lung: Secondary | ICD-10-CM

## 2012-02-06 DIAGNOSIS — Z5111 Encounter for antineoplastic chemotherapy: Secondary | ICD-10-CM

## 2012-02-06 LAB — CBC WITH DIFFERENTIAL/PLATELET
Basophils Absolute: 0 10*3/uL (ref 0.0–0.1)
Eosinophils Absolute: 0 10*3/uL (ref 0.0–0.5)
HCT: 40 % (ref 34.8–46.6)
HGB: 14 g/dL (ref 11.6–15.9)
MCV: 91.7 fL (ref 79.5–101.0)
MONO%: 2.3 % (ref 0.0–14.0)
NEUT#: 7.5 10*3/uL — ABNORMAL HIGH (ref 1.5–6.5)
NEUT%: 87 % — ABNORMAL HIGH (ref 38.4–76.8)
Platelets: 169 10*3/uL (ref 145–400)
RDW: 13.4 % (ref 11.2–14.5)

## 2012-02-06 LAB — COMPREHENSIVE METABOLIC PANEL (CC13)
Albumin: 3.5 g/dL (ref 3.5–5.0)
Alkaline Phosphatase: 92 U/L (ref 40–150)
BUN: 20 mg/dL (ref 7.0–26.0)
Calcium: 8.9 mg/dL (ref 8.4–10.4)
Glucose: 137 mg/dl — ABNORMAL HIGH (ref 70–99)
Potassium: 4.2 mEq/L (ref 3.5–5.1)

## 2012-02-06 MED ORDER — SODIUM CHLORIDE 0.9 % IV SOLN
500.0000 mg/m2 | Freq: Once | INTRAVENOUS | Status: AC
  Administered 2012-02-06: 900 mg via INTRAVENOUS
  Filled 2012-02-06: qty 36

## 2012-02-06 MED ORDER — SODIUM CHLORIDE 0.9 % IJ SOLN
10.0000 mL | INTRAMUSCULAR | Status: DC | PRN
  Administered 2012-02-06: 10 mL
  Filled 2012-02-06: qty 10

## 2012-02-06 MED ORDER — HEPARIN SOD (PORK) LOCK FLUSH 100 UNIT/ML IV SOLN
500.0000 [IU] | Freq: Once | INTRAVENOUS | Status: AC | PRN
  Administered 2012-02-06: 500 [IU]
  Filled 2012-02-06: qty 5

## 2012-02-06 MED ORDER — SODIUM CHLORIDE 0.9 % IV SOLN
Freq: Once | INTRAVENOUS | Status: AC
  Administered 2012-02-06: 15:00:00 via INTRAVENOUS

## 2012-02-06 MED ORDER — DEXAMETHASONE SODIUM PHOSPHATE 10 MG/ML IJ SOLN
10.0000 mg | Freq: Once | INTRAMUSCULAR | Status: AC
  Administered 2012-02-06: 10 mg via INTRAVENOUS

## 2012-02-06 MED ORDER — ONDANSETRON 8 MG/50ML IVPB (CHCC)
8.0000 mg | Freq: Once | INTRAVENOUS | Status: AC
  Administered 2012-02-06: 8 mg via INTRAVENOUS

## 2012-02-06 NOTE — Patient Instructions (Addendum)
[facility] Discharge Instructions for Patients Receiving Chemotherapy  Today you received the following chemotherapy agents:  Alimta  To help prevent nausea and vomiting after your treatment, we encourage you to take your nausea medication as ordered per MD.    If you develop nausea and vomiting that is not controlled by your nausea medication, call the clinic. If it is after clinic hours your family physician or the after hours number for the clinic or go to the Emergency Department.   BELOW ARE SYMPTOMS THAT SHOULD BE REPORTED IMMEDIATELY:  *FEVER GREATER THAN 100.5 F  *CHILLS WITH OR WITHOUT FEVER  NAUSEA AND VOMITING THAT IS NOT CONTROLLED WITH YOUR NAUSEA MEDICATION  *UNUSUAL SHORTNESS OF BREATH  *UNUSUAL BRUISING OR BLEEDING  TENDERNESS IN MOUTH AND THROAT WITH OR WITHOUT PRESENCE OF ULCERS  *URINARY PROBLEMS  *BOWEL PROBLEMS  UNUSUAL RASH Items with * indicate a potential emergency and should be followed up as soon as possible.   Please let the nurse know about any problems that you may have experienced. Feel free to call the clinic you have any questions or concerns. The clinic phone number is [phone].   I have been informed and understand all the instructions given to me. I know to contact the clinic, my physician, or go to the Emergency Department if any problems should occur. I do not have any questions at this time, but understand that I may call the clinic during office hours   should I have any questions or need assistance in obtaining follow up care.    __________________________________________  _____________  __________ Signature of Patient or Authorized Representative            Date                   Time    __________________________________________ Nurse's Signature    

## 2012-02-24 ENCOUNTER — Telehealth: Payer: Self-pay | Admitting: Medical Oncology

## 2012-02-24 NOTE — Telephone Encounter (Signed)
Does not want to take steroid premed -" makes me feel crazy ". I told her that Dr Donnald Garre said she does not need to take it

## 2012-02-27 ENCOUNTER — Other Ambulatory Visit (HOSPITAL_BASED_OUTPATIENT_CLINIC_OR_DEPARTMENT_OTHER): Payer: Medicare Other | Admitting: Lab

## 2012-02-27 ENCOUNTER — Telehealth: Payer: Self-pay | Admitting: Internal Medicine

## 2012-02-27 ENCOUNTER — Ambulatory Visit (HOSPITAL_BASED_OUTPATIENT_CLINIC_OR_DEPARTMENT_OTHER): Payer: Medicare Other

## 2012-02-27 ENCOUNTER — Ambulatory Visit (HOSPITAL_BASED_OUTPATIENT_CLINIC_OR_DEPARTMENT_OTHER): Payer: Medicare Other | Admitting: Physician Assistant

## 2012-02-27 VITALS — BP 119/71 | HR 60 | Temp 97.0°F | Resp 18 | Ht 62.0 in | Wt 151.9 lb

## 2012-02-27 DIAGNOSIS — Z0000 Encounter for general adult medical examination without abnormal findings: Secondary | ICD-10-CM

## 2012-02-27 DIAGNOSIS — C3410 Malignant neoplasm of upper lobe, unspecified bronchus or lung: Secondary | ICD-10-CM

## 2012-02-27 DIAGNOSIS — C3490 Malignant neoplasm of unspecified part of unspecified bronchus or lung: Secondary | ICD-10-CM

## 2012-02-27 DIAGNOSIS — Z5111 Encounter for antineoplastic chemotherapy: Secondary | ICD-10-CM

## 2012-02-27 DIAGNOSIS — Z23 Encounter for immunization: Secondary | ICD-10-CM

## 2012-02-27 DIAGNOSIS — C801 Malignant (primary) neoplasm, unspecified: Secondary | ICD-10-CM

## 2012-02-27 LAB — CBC WITH DIFFERENTIAL/PLATELET
Eosinophils Absolute: 0.1 10*3/uL (ref 0.0–0.5)
MONO#: 0.5 10*3/uL (ref 0.1–0.9)
MONO%: 12.3 % (ref 0.0–14.0)
NEUT#: 1.8 10*3/uL (ref 1.5–6.5)
RBC: 4.22 10*6/uL (ref 3.70–5.45)
RDW: 13.9 % (ref 11.2–14.5)
WBC: 3.9 10*3/uL (ref 3.9–10.3)
nRBC: 0 % (ref 0–0)

## 2012-02-27 LAB — COMPREHENSIVE METABOLIC PANEL (CC13)
AST: 18 U/L (ref 5–34)
Albumin: 3.6 g/dL (ref 3.5–5.0)
Alkaline Phosphatase: 82 U/L (ref 40–150)
BUN: 12 mg/dL (ref 7.0–26.0)
CO2: 22 mEq/L (ref 22–29)
Calcium: 9.5 mg/dL (ref 8.4–10.4)
Chloride: 109 mEq/L — ABNORMAL HIGH (ref 98–107)
Creatinine: 0.8 mg/dL (ref 0.6–1.1)
Glucose: 77 mg/dl (ref 70–99)
Sodium: 142 mEq/L (ref 136–145)

## 2012-02-27 MED ORDER — INFLUENZA VIRUS VACCINE SPLIT IM INJ: 0.5000 mL | INJECTION | Freq: Once | INTRAMUSCULAR | Status: DC

## 2012-02-27 MED ORDER — ONDANSETRON 8 MG/50ML IVPB (CHCC)
8.0000 mg | Freq: Once | INTRAVENOUS | Status: AC
  Administered 2012-02-27: 8 mg via INTRAVENOUS

## 2012-02-27 MED ORDER — INFLUENZA VIRUS VACC SPLIT PF IM SUSP
0.5000 mL | Freq: Once | INTRAMUSCULAR | Status: AC
  Administered 2012-02-27: 0.5 mL via INTRAMUSCULAR
  Filled 2012-02-27: qty 0.5

## 2012-02-27 MED ORDER — DEXAMETHASONE SODIUM PHOSPHATE 10 MG/ML IJ SOLN
10.0000 mg | Freq: Once | INTRAMUSCULAR | Status: AC
  Administered 2012-02-27: 10 mg via INTRAVENOUS

## 2012-02-27 MED ORDER — SODIUM CHLORIDE 0.9 % IV SOLN: Freq: Once | INTRAVENOUS | Status: DC

## 2012-02-27 MED ORDER — HEPARIN SOD (PORK) LOCK FLUSH 100 UNIT/ML IV SOLN
500.0000 [IU] | Freq: Once | INTRAVENOUS | Status: AC | PRN
  Administered 2012-02-27: 500 [IU]
  Filled 2012-02-27: qty 5

## 2012-02-27 MED ORDER — SODIUM CHLORIDE 0.9 % IJ SOLN
10.0000 mL | INTRAMUSCULAR | Status: DC | PRN
  Administered 2012-02-27: 10 mL
  Filled 2012-02-27: qty 10

## 2012-02-27 MED ORDER — SODIUM CHLORIDE 0.9 % IV SOLN
500.0000 mg/m2 | Freq: Once | INTRAVENOUS | Status: AC
  Administered 2012-02-27: 900 mg via INTRAVENOUS
  Filled 2012-02-27: qty 36

## 2012-02-27 NOTE — Patient Instructions (Signed)
[facility] Discharge Instructions for Patients Receiving Chemotherapy  Today you received the following chemotherapy agents  Alimta To help prevent nausea and vomiting after your treatment, we encourage you to take your nausea medication as direcet If you develop nausea and vomiting that is not controlled by your nausea medication, call the clinic. If it is after clinic hours your family physician or the after hours number for the clinic or go to the Emergency Department.   BELOW ARE SYMPTOMS THAT SHOULD BE REPORTED IMMEDIATELY:  *FEVER GREATER THAN 100.5 F  *CHILLS WITH OR WITHOUT FEVER  NAUSEA AND VOMITING THAT IS NOT CONTROLLED WITH YOUR NAUSEA MEDICATION  *UNUSUAL SHORTNESS OF BREATH  *UNUSUAL BRUISING OR BLEEDING  TENDERNESS IN MOUTH AND THROAT WITH OR WITHOUT PRESENCE OF ULCERS  *URINARY PROBLEMS  *BOWEL PROBLEMS  UNUSUAL RASH Items with * indicate a potential emergency and should be followed up as soon as possible.  One of the nurses will contact you 24 hours after your treatment. Please let the nurse know about any problems that you may have experienced. Feel free to call the clinic you have any questions or concerns. The clinic phone number is [phone].   I have been informed and understand all the instructions given to me. I know to contact the clinic, my physician, or go to the Emergency Department if any problems should occur. I do not have any questions at this time, but understand that I may call the clinic during office hours   should I have any questions or need assistance in obtaining follow up care.    __________________________________________  _____________  __________ Signature of Patient or Authorized Representative            Date                   Time    __________________________________________ Nurse's Signature

## 2012-02-27 NOTE — Telephone Encounter (Signed)
Per P.A. AJ cancel 10.17 lab bc she wanted pt to have lab and est on 10.15...gv and printed appt to pt.

## 2012-03-02 NOTE — Progress Notes (Signed)
[facility] [phone]   Fax:([phone]) [phone]  OFFICE PROGRESS NOTE  [name],[name], MD [address] [place] [address]  Principle Diagnosis: Recurrent non-small cell lung cancer, initially diagnosed a stage IB (T2, N0, M0) adenocarcinoma in June 2008 with a tumor size of 8 cm.   Prior Therapy:  #1 status post left upper lobectomy with lymph node dissection under the care of Dr. Edwyna Shell on 06/29/2006.  #2 status post 4 cycles of adjuvant chemotherapy with cisplatin and Taxotere last dose given 08/01/2006.  #3 status post 6 cycles of systemic chemotherapy with carboplatin and Alimta for disease recurrence last dose given 12/12/2009.  #4 Maintenance chemotherapy with Alimta at 500 mg per meter squared given every 3 weeks status post 23 cycles.   Current therapy: Patient has resumed systemic chemotherapy with Alimta at 500 mg per meter squared given every 3 weeks status post 1 cycle. Of note the patient is not on dexamethasone with the Alimta do to rash/flushing from the dexamethasone.  INTERVAL HISTORY: [name] 78 y.o. female returns to the clinic today for a routine followup visit. She tolerated resuming chemotherapy with Alimta relatively well. She reports a cough that is nonproductive and not associated with fever chills or shortness of breath. She reports occasional diarrhea if she eats sweets . She requests a flu shot today.she voiced no other specific complaints. She denied having any significant chest pain, shortness breath, or hemoptysis. She denied having any significant weight loss or night sweats.   MEDICAL HISTORY: Past Medical History  Diagnosis Date  . lung ca dx'd 06/2006    rt and lt lung  . History of migraine headaches   . Hypercholesterolemia   . Lung cancer     ALLERGIES:  is allergic to ondansetron hcl; simvastatin; and iodinated diagnostic agents.  MEDICATIONS:  Current Outpatient Prescriptions  Medication Sig  Dispense Refill  . vitamin B-12 (CYANOCOBALAMIN) 500 MCG tablet Take 500 mcg by mouth daily.      . Calcium Carbonate-Vit D-Min (CALTRATE PLUS PO) Take 500 mg by mouth 2 (two) times daily.        . fish oil-omega-3 fatty acids 1000 MG capsule Take 2 g by mouth daily.        . folic acid (FOLVITE) 1 MG tablet Take 1 mg by mouth daily.        . influenza virus trivalent vaccine (FLUZONE) injection Inject 0.5 mLs into the muscle once.  0.5 mL  0  . meclizine (ANTIVERT) 25 MG tablet Take 12.5 mg by mouth 3 (three) times daily as needed.      . Multiple Vitamins-Minerals (CENTRUM SILVER PO) Take 1 tablet by mouth daily.        . prochlorperazine (COMPAZINE) 10 MG tablet Take 10 mg by mouth every 6 (six) hours as needed.        . ranitidine (ZANTAC) 75 MG tablet Take 75 mg by mouth. Pt takes about 3 times a week prn       . Thiamine HCl (VITAMIN B-1) 100 MG tablet Take by mouth daily.          REVIEW OF SYSTEMS:  A comprehensive review of systems was negative.   PHYSICAL EXAMINATION: General appearance: alert, cooperative and no distress Head: Normocephalic, without obvious abnormality, atraumatic Neck: no adenopathy Lymph nodes: Cervical, supraclavicular, and axillary nodes normal. Resp: clear to auscultation bilaterally Cardio: regular rate and rhythm, S1, S2 normal, no murmur, click, rub or gallop  GI: soft, non-tender; bowel sounds normal; no masses,  no organomegaly Extremities: extremities normal, atraumatic, no cyanosis or edema Neurologic: Alert and oriented X 3, normal strength and tone. Normal symmetric reflexes. Normal coordination and gait  ECOG PERFORMANCE STATUS: 1 - Symptomatic but completely ambulatory  Blood pressure 119/71, pulse 60, temperature 97 F (36.1 C), temperature source Oral, resp. rate 18, height 5\' 2"  (1.575 m), weight 151 lb 14.4 oz (68.901 kg).  LABORATORY DATA: Lab Results  Component Value Date   WBC 3.9 02/27/2012   HGB 13.4 02/27/2012   HCT 40.0 02/27/2012     MCV 94.8 02/27/2012   PLT 234 02/27/2012      Chemistry      Component Value Date/Time   NA 142 02/27/2012 1124   NA 139 01/24/2012 1008   NA 140 08/21/2011 1306   K 3.8 02/27/2012 1124   K 4.2 01/24/2012 1008   K 4.1 08/21/2011 1306   CL 109* 02/27/2012 1124   CL 103 01/24/2012 1008   CL 107 08/21/2011 1306   CO2 22 02/27/2012 1124   CO2 28 01/24/2012 1008   CO2 22 08/21/2011 1306   BUN 12.0 02/27/2012 1124   BUN 13 01/24/2012 1008   BUN 11 08/21/2011 1306   CREATININE 0.8 02/27/2012 1124   CREATININE 0.8 01/24/2012 1008   CREATININE 0.81 08/21/2011 1306      Component Value Date/Time   CALCIUM 9.5 02/27/2012 1124   CALCIUM 8.6 01/24/2012 1008   CALCIUM 8.8 08/21/2011 1306   ALKPHOS 82 02/27/2012 1124   ALKPHOS 79 01/24/2012 1008   ALKPHOS 87 08/21/2011 1306   AST 18 02/27/2012 1124   AST 24 01/24/2012 1008   AST 17 08/21/2011 1306   ALT 12 02/27/2012 1124   ALT 8 08/21/2011 1306   BILITOT 0.90 02/27/2012 1124   BILITOT 0.90 01/24/2012 1008   BILITOT 0.6 08/21/2011 1306       RADIOGRAPHIC STUDIES:  Ct Chest Wo Contrast  01/24/2012  *RADIOLOGY REPORT*  Clinical Data:  Bilateral lung cancer.  Chemotherapy completed December 2013.  Cough.  CT CHEST, ABDOMEN AND PELVIS WITHOUT CONTRAST  Technique:  Multidetector CT imaging of the chest, abdomen and pelvis was performed following the standard protocol without IV contrast.  Comparison:  08/14/2011.   CT CHEST  Findings:  No pathologically enlarged mediastinal or axillary lymph nodes.  Hilar regions are difficult to definitively evaluate without IV contrast.  Heart size normal.  Coronary artery calcification.  Atherosclerotic calcification of the arterial vasculature, including coronary arteries.  No pericardial effusion.  Minimal biapical pleural parenchymal scarring.  A spiculated mass in the posterior segment right upper lobe measures 2.4 x 3.0 cm (previously 2.1 x 1.3 cm).  There are adjacent spiculated nodules in the anterior right upper lobe.   Right middle lobe nodules have increased in size as well, with index lesion measuring 9 x 11 mm (previously 6 x 10 mm) on image 43.  Subpleural ground-glass nodular density in the left upper lobe (image 33) is stable.  Postoperative changes of left lower lobectomy with associated scarring and mild volume loss.  4 mm subpleural left upper lobe nodule (image 22), stable.  No pleural fluid.  Airway is otherwise unremarkable.  IMPRESSION:  Interval progression of disease as evidenced by an enlarging right upper lobe mass and adjacent spiculated nodules in the right upper lobe and right middle lobe.   CT ABDOMEN AND PELVIS  Findings:  Liver, gallbladder and adrenal glands are unremarkable. An 8  mm low attenuation lesion in the interpolar right kidney is stable in size.  Spleen, pancreas, stomach and bowel are unremarkable.  A 4.6 x 3.1 cm low attenuation lesion in the right adnexa is unchanged.  Hysterectomy.  No pathologically enlarged lymph nodes. No free fluid.  Scattered atherosclerotic calcification of the arterial vasculature without abdominal aortic aneurysm.  No worrisome lytic or sclerotic lesions. Prominent Schmorl's nodes along superior endplates in the lower thoracic spine.  There may be a mild compression deformity about the superior plate of L1, unchanged.  IMPRESSION:  1.  No evidence of metastatic disease in the abdomen or pelvis. 2.  Stable right adnexal low attenuation lesion.   Original Report Authenticated By: Reyes Ivan, M.D.     ASSESSMENT/PLAN: This is a very pleasant 78 years old white female with recurrent non-small cell lung cancer status post systemic chemotherapy with carboplatin and Alimta followed by maintenance chemotherapy with single agent Alimta for 23 cycles that was discontinued based on the patient's wishes to take a break from the treatment. Unfortunately she has evidence for disease progression on his recent scan. She is now status post one cycle of her resume systemic  chemotherapy with Alimta 500 mg per meter squared. Patient was discussed Dr. Arbutus Ped. She will proceed with cycle #2 of systemic chemotherapy with Alimta at 500 mg router squared given every 3 weeks. She will return in 3 weeks prior to cycle #3 with a repeat CBC differential and C. met. She will be given a flu vaccine today.  Laural Benes, Calie Buttrey E, [name]   All questions were answered. The patient knows to call the clinic with any problems, questions or concerns. We can certainly see the patient much sooner if necessary.  I spent 20 minutes counseling the patient face to face. The total time spent in the appointment was 30 minutes.

## 2012-03-17 ENCOUNTER — Ambulatory Visit (HOSPITAL_BASED_OUTPATIENT_CLINIC_OR_DEPARTMENT_OTHER): Payer: Medicare Other | Admitting: Physician Assistant

## 2012-03-17 ENCOUNTER — Telehealth: Payer: Self-pay | Admitting: Internal Medicine

## 2012-03-17 ENCOUNTER — Other Ambulatory Visit (HOSPITAL_BASED_OUTPATIENT_CLINIC_OR_DEPARTMENT_OTHER): Payer: Medicare Other | Admitting: Lab

## 2012-03-17 ENCOUNTER — Ambulatory Visit: Payer: Medicare Other | Admitting: Lab

## 2012-03-17 ENCOUNTER — Telehealth: Payer: Self-pay | Admitting: *Deleted

## 2012-03-17 ENCOUNTER — Encounter: Payer: Self-pay | Admitting: Physician Assistant

## 2012-03-17 VITALS — BP 112/66 | HR 77 | Temp 97.2°F | Resp 20 | Ht 62.0 in | Wt 156.3 lb

## 2012-03-17 DIAGNOSIS — C3430 Malignant neoplasm of lower lobe, unspecified bronchus or lung: Secondary | ICD-10-CM

## 2012-03-17 DIAGNOSIS — C3490 Malignant neoplasm of unspecified part of unspecified bronchus or lung: Secondary | ICD-10-CM

## 2012-03-17 DIAGNOSIS — R350 Frequency of micturition: Secondary | ICD-10-CM

## 2012-03-17 DIAGNOSIS — C3410 Malignant neoplasm of upper lobe, unspecified bronchus or lung: Secondary | ICD-10-CM

## 2012-03-17 LAB — CBC WITH DIFFERENTIAL/PLATELET
Eosinophils Absolute: 0 10*3/uL (ref 0.0–0.5)
HCT: 38 % (ref 34.8–46.6)
LYMPH%: 32.3 % (ref 14.0–49.7)
MCHC: 33.8 g/dL (ref 31.5–36.0)
MCV: 98.2 fL (ref 79.5–101.0)
MONO#: 0.4 10*3/uL (ref 0.1–0.9)
MONO%: 12.4 % (ref 0.0–14.0)
NEUT#: 1.9 10*3/uL (ref 1.5–6.5)
NEUT%: 53.8 % (ref 38.4–76.8)
Platelets: 208 10*3/uL (ref 145–400)
RBC: 3.87 10*6/uL (ref 3.70–5.45)
WBC: 3.6 10*3/uL — ABNORMAL LOW (ref 3.9–10.3)

## 2012-03-17 LAB — URINALYSIS, MICROSCOPIC - CHCC
Bilirubin (Urine): NEGATIVE
Glucose: NEGATIVE g/dL
Leukocyte Esterase: NEGATIVE
Nitrite: NEGATIVE
Specific Gravity, Urine: 1.03 (ref 1.003–1.035)

## 2012-03-17 LAB — COMPREHENSIVE METABOLIC PANEL (CC13)
ALT: 11 U/L (ref 0–55)
CO2: 21 mEq/L — ABNORMAL LOW (ref 22–29)
Calcium: 9.2 mg/dL (ref 8.4–10.4)
Chloride: 110 mEq/L — ABNORMAL HIGH (ref 98–107)
Creatinine: 0.8 mg/dL (ref 0.6–1.1)
Glucose: 74 mg/dl (ref 70–99)
Total Bilirubin: 0.6 mg/dL (ref 0.20–1.20)

## 2012-03-17 NOTE — Telephone Encounter (Signed)
gv and printed appt schedule for pt for Oct and NOV °

## 2012-03-17 NOTE — Patient Instructions (Addendum)
We will check a urinalysis and a urine C&S to evaluate your urinary symptomatology Followup with Dr. Arbutus Ped in 3 weeks with repeat CT of your chest abdomen and pelvis without contrast to reevaluate your disease

## 2012-03-17 NOTE — Telephone Encounter (Signed)
Per staff message and POF I have scheduled apptsl   JMM

## 2012-03-17 NOTE — Progress Notes (Signed)
[facility] [phone]   [phone]  OFFICE PROGRESS NOTE  [name],[name], MD [address] [place] [address]  Principle Diagnosis: Recurrent non-small cell lung cancer, initially diagnosed a stage IB (T2, N0, M0) adenocarcinoma in June 2008 with a tumor size of 8 cm.   Prior Therapy:  #1 status post left upper lobectomy with lymph node dissection under the care of Dr. Edwyna Shell on 06/29/2006.  #2 status post 4 cycles of adjuvant chemotherapy with cisplatin and Taxotere last dose given 08/01/2006.  #3 status post 6 cycles of systemic chemotherapy with carboplatin and Alimta for disease recurrence last dose given 12/12/2009.  #4 Maintenance chemotherapy with Alimta at 500 mg per meter squared given every 3 weeks status post 23 cycles.   Current therapy: Patient has resumed systemic chemotherapy with Alimta at 500 mg per meter squared given every 3 weeks status post 2 cycles. Of note the patient is not on dexamethasone with the Alimta do to rash/flushing from the dexamethasone.  INTERVAL HISTORY: [name] 78 y.o. female returns to the clinic today for a routine followup visit. She is tolerating resuming chemotherapy with Alimta relatively well. She complains of "cystitis" type symptoms for the past 3 weeks. She is been using over-the-counter cranberry pills as well as drinking a lot of cranberry juice. She complains of urinary frequency. She denied fever or chills. She denied any chest pain, shortness breath, hemoptysis, night sweats, nausea or vomiting, diarrhea or constipation.  She denied having any significant weight loss or night sweats.   MEDICAL HISTORY: Past Medical History  Diagnosis Date  . lung ca dx'd 06/2006    rt and lt lung  . History of migraine headaches   . Hypercholesterolemia   . Lung cancer     ALLERGIES:  is allergic to ondansetron hcl; simvastatin; and iodinated diagnostic agents.  MEDICATIONS:  Current  Outpatient Prescriptions  Medication Sig Dispense Refill  . Calcium Carbonate-Vit D-Min (CALTRATE PLUS PO) Take 500 mg by mouth 2 (two) times daily.        . fish oil-omega-3 fatty acids 1000 MG capsule Take 2 g by mouth daily.        . folic acid (FOLVITE) 1 MG tablet Take 1 mg by mouth daily.        . influenza virus trivalent vaccine (FLUZONE) injection Inject 0.5 mLs into the muscle once.  0.5 mL  0  . meclizine (ANTIVERT) 25 MG tablet Take 12.5 mg by mouth 3 (three) times daily as needed.      . Multiple Vitamins-Minerals (CENTRUM SILVER PO) Take 1 tablet by mouth daily.        . prochlorperazine (COMPAZINE) 10 MG tablet Take 10 mg by mouth every 6 (six) hours as needed.        . ranitidine (ZANTAC) 75 MG tablet Take 75 mg by mouth. Pt takes about 3 times a week prn       . Thiamine HCl (VITAMIN B-1) 100 MG tablet Take by mouth daily.        . vitamin B-12 (CYANOCOBALAMIN) 500 MCG tablet Take 500 mcg by mouth daily.        REVIEW OF SYSTEMS:  A comprehensive review of systems was negative.   PHYSICAL EXAMINATION: General appearance: alert, cooperative and no distress Head: Normocephalic, without obvious abnormality, atraumatic Neck: no adenopathy Lymph nodes: Cervical, supraclavicular, and axillary nodes normal. Resp: clear to auscultation bilaterally Cardio: regular rate and rhythm, S1, S2  normal, no murmur, click, rub or gallop GI: soft, non-tender; bowel sounds normal; no masses,  no organomegaly, no suprapubic tenderness Extremities: extremities normal, atraumatic, no cyanosis or edema Neurologic: Alert and oriented X 3, normal strength and tone. Normal symmetric reflexes. Normal coordination and gait  ECOG PERFORMANCE STATUS: 1 - Symptomatic but completely ambulatory  Blood pressure 112/66, pulse 77, temperature 97.2 F (36.2 C), temperature source Oral, resp. rate 20, height 5\' 2"  (1.575 m), weight 156 lb 4.8 oz (70.897 kg).  LABORATORY DATA: Lab Results  Component Value  Date   WBC 3.6* 03/17/2012   HGB 12.9 03/17/2012   HCT 38.0 03/17/2012   MCV 98.2 03/17/2012   PLT 208 03/17/2012      Chemistry      Component Value Date/Time   NA 142 03/17/2012 0928   NA 139 01/24/2012 1008   NA 140 08/21/2011 1306   K 3.9 03/17/2012 0928   K 4.2 01/24/2012 1008   K 4.1 08/21/2011 1306   CL 110* 03/17/2012 0928   CL 103 01/24/2012 1008   CL 107 08/21/2011 1306   CO2 21* 03/17/2012 0928   CO2 28 01/24/2012 1008   CO2 22 08/21/2011 1306   BUN 13.0 03/17/2012 0928   BUN 13 01/24/2012 1008   BUN 11 08/21/2011 1306   CREATININE 0.8 03/17/2012 0928   CREATININE 0.8 01/24/2012 1008   CREATININE 0.81 08/21/2011 1306      Component Value Date/Time   CALCIUM 9.2 03/17/2012 0928   CALCIUM 8.6 01/24/2012 1008   CALCIUM 8.8 08/21/2011 1306   ALKPHOS 81 03/17/2012 0928   ALKPHOS 79 01/24/2012 1008   ALKPHOS 87 08/21/2011 1306   AST 18 03/17/2012 0928   AST 24 01/24/2012 1008   AST 17 08/21/2011 1306   ALT 11 03/17/2012 0928   ALT 8 08/21/2011 1306   BILITOT 0.60 03/17/2012 0928   BILITOT 0.90 01/24/2012 1008   BILITOT 0.6 08/21/2011 1306       RADIOGRAPHIC STUDIES:  Ct Chest Wo Contrast  01/24/2012  *RADIOLOGY REPORT*  Clinical Data:  Bilateral lung cancer.  Chemotherapy completed December 2013.  Cough.  CT CHEST, ABDOMEN AND PELVIS WITHOUT CONTRAST  Technique:  Multidetector CT imaging of the chest, abdomen and pelvis was performed following the standard protocol without IV contrast.  Comparison:  08/14/2011.   CT CHEST  Findings:  No pathologically enlarged mediastinal or axillary lymph nodes.  Hilar regions are difficult to definitively evaluate without IV contrast.  Heart size normal.  Coronary artery calcification.  Atherosclerotic calcification of the arterial vasculature, including coronary arteries.  No pericardial effusion.  Minimal biapical pleural parenchymal scarring.  A spiculated mass in the posterior segment right upper lobe measures 2.4 x 3.0 cm (previously 2.1 x  1.3 cm).  There are adjacent spiculated nodules in the anterior right upper lobe.  Right middle lobe nodules have increased in size as well, with index lesion measuring 9 x 11 mm (previously 6 x 10 mm) on image 43.  Subpleural ground-glass nodular density in the left upper lobe (image 33) is stable.  Postoperative changes of left lower lobectomy with associated scarring and mild volume loss.  4 mm subpleural left upper lobe nodule (image 22), stable.  No pleural fluid.  Airway is otherwise unremarkable.  IMPRESSION:  Interval progression of disease as evidenced by an enlarging right upper lobe mass and adjacent spiculated nodules in the right upper lobe and right middle lobe.   CT ABDOMEN AND PELVIS  Findings:  Liver, gallbladder and adrenal glands are unremarkable. An 8 mm low attenuation lesion in the interpolar right kidney is stable in size.  Spleen, pancreas, stomach and bowel are unremarkable.  A 4.6 x 3.1 cm low attenuation lesion in the right adnexa is unchanged.  Hysterectomy.  No pathologically enlarged lymph nodes. No free fluid.  Scattered atherosclerotic calcification of the arterial vasculature without abdominal aortic aneurysm.  No worrisome lytic or sclerotic lesions. Prominent Schmorl's nodes along superior endplates in the lower thoracic spine.  There may be a mild compression deformity about the superior plate of L1, unchanged.  IMPRESSION:  1.  No evidence of metastatic disease in the abdomen or pelvis. 2.  Stable right adnexal low attenuation lesion.   Original Report Authenticated By: Reyes Ivan, M.D.     ASSESSMENT/PLAN: This is a very pleasant 78 years old white female with recurrent non-small cell lung cancer status post systemic chemotherapy with carboplatin and Alimta followed by maintenance chemotherapy with single agent Alimta for 23 cycles that was discontinued based on the patient's wishes to take a break from the treatment. Unfortunately she has evidence for disease  progression on her last scan. She is now status post 2 cycles of her resumed systemic chemotherapy with Alimta 500 mg per meter squared. Patient was discussed Dr. Arbutus Ped. She will proceed with cycle #3 of systemic chemotherapy with Alimta at 500 mg router squared given every 3 weeks as scheduled on 03/19/2012. She'll followup with Dr. Arbutus Ped in 3 weeks with repeat CBC differential, C. met and CT the chest abdomen and pelvis without contrast to reevaluate her disease. To evaluate her urinary symptomatology we will obtain a clean catch urinalysis and urine culture and sensitivity. Should there be evidence of urinary tract infection appropriate antibiotic therapy will be instituted.   [name], [name] Inman E, [name]   All questions were answered. The patient knows to call the clinic with any problems, questions or concerns. We can certainly see the patient much sooner if necessary.  I spent 20 minutes counseling the patient face to face. The total time spent in the appointment was 30 minutes.

## 2012-03-18 ENCOUNTER — Telehealth: Payer: Self-pay | Admitting: Medical Oncology

## 2012-03-18 ENCOUNTER — Encounter: Payer: Self-pay | Admitting: Pharmacist

## 2012-03-18 DIAGNOSIS — R300 Dysuria: Secondary | ICD-10-CM

## 2012-03-18 MED ORDER — CIPROFLOXACIN HCL 500 MG PO TABS: 500.0000 mg | ORAL_TABLET | Freq: Two times a day (BID) | ORAL | Status: DC

## 2012-03-18 NOTE — Telephone Encounter (Signed)
Per Dr. Arbutus Ped I called in cipro and pt notified.

## 2012-03-18 NOTE — Telephone Encounter (Signed)
Asking about urine test results.

## 2012-03-19 ENCOUNTER — Other Ambulatory Visit: Payer: Medicare Other | Admitting: Lab

## 2012-03-19 ENCOUNTER — Ambulatory Visit (HOSPITAL_BASED_OUTPATIENT_CLINIC_OR_DEPARTMENT_OTHER): Payer: Medicare Other

## 2012-03-19 VITALS — BP 120/72 | HR 76 | Temp 98.2°F | Resp 18

## 2012-03-19 DIAGNOSIS — Z5111 Encounter for antineoplastic chemotherapy: Secondary | ICD-10-CM

## 2012-03-19 DIAGNOSIS — C801 Malignant (primary) neoplasm, unspecified: Secondary | ICD-10-CM

## 2012-03-19 DIAGNOSIS — C3410 Malignant neoplasm of upper lobe, unspecified bronchus or lung: Secondary | ICD-10-CM

## 2012-03-19 DIAGNOSIS — C3490 Malignant neoplasm of unspecified part of unspecified bronchus or lung: Secondary | ICD-10-CM

## 2012-03-19 MED ORDER — SODIUM CHLORIDE 0.9 % IV SOLN
Freq: Once | INTRAVENOUS | Status: AC
  Administered 2012-03-19: 15:00:00 via INTRAVENOUS

## 2012-03-19 MED ORDER — HEPARIN SOD (PORK) LOCK FLUSH 100 UNIT/ML IV SOLN
500.0000 [IU] | Freq: Once | INTRAVENOUS | Status: AC | PRN
  Administered 2012-03-19: 500 [IU]
  Filled 2012-03-19: qty 5

## 2012-03-19 MED ORDER — DEXAMETHASONE SODIUM PHOSPHATE 10 MG/ML IJ SOLN
10.0000 mg | Freq: Once | INTRAMUSCULAR | Status: AC
  Administered 2012-03-19: 10 mg via INTRAVENOUS

## 2012-03-19 MED ORDER — SODIUM CHLORIDE 0.9 % IV SOLN
500.0000 mg/m2 | Freq: Once | INTRAVENOUS | Status: AC
  Administered 2012-03-19: 900 mg via INTRAVENOUS
  Filled 2012-03-19: qty 36

## 2012-03-19 MED ORDER — SODIUM CHLORIDE 0.9 % IJ SOLN
10.0000 mL | INTRAMUSCULAR | Status: DC | PRN
  Administered 2012-03-19: 10 mL
  Filled 2012-03-19: qty 10

## 2012-03-19 MED ORDER — ONDANSETRON 8 MG/50ML IVPB (CHCC)
8.0000 mg | Freq: Once | INTRAVENOUS | Status: AC
  Administered 2012-03-19: 8 mg via INTRAVENOUS

## 2012-03-19 NOTE — Progress Notes (Signed)
Discharged at 1627, ambulatory by herself.  No distress noted.

## 2012-03-19 NOTE — Patient Instructions (Signed)
[facility] Discharge Instructions for Patients Receiving Chemotherapy  Today you received the following chemotherapy agents alimta.  To help prevent nausea and vomiting after your treatment, we encourage you to take your nausea medication compazine 10 mg. Begin taking it at anytime upon discharge and take it as often as prescribed for the next 72 hours.   If you develop nausea and vomiting that is not controlled by your nausea medication, call the clinic. If it is after clinic hours your family physician or the after hours number for the clinic or go to the Emergency Department.   BELOW ARE SYMPTOMS THAT SHOULD BE REPORTED IMMEDIATELY:  *FEVER GREATER THAN 100.5 F  *CHILLS WITH OR WITHOUT FEVER  NAUSEA AND VOMITING THAT IS NOT CONTROLLED WITH YOUR NAUSEA MEDICATION  *UNUSUAL SHORTNESS OF BREATH  *UNUSUAL BRUISING OR BLEEDING  TENDERNESS IN MOUTH AND THROAT WITH OR WITHOUT PRESENCE OF ULCERS  *URINARY PROBLEMS  *BOWEL PROBLEMS  UNUSUAL RASH Items with * indicate a potential emergency and should be followed up as soon as possible.  Please call to let a nurse know about any problems that you may have experienced. Feel free to call the clinic you have any questions or concerns. The clinic phone number is [phone].   I have been informed and understand all the instructions given to me. I know to contact the clinic, my physician, or go to the Emergency Department if any problems should occur. I do not have any questions at this time, but understand that I may call the clinic during office hours   should I have any questions or need assistance in obtaining follow up care.    __________________________________________  _____________  __________ Signature of Patient or Authorized Representative            Date                   Time    __________________________________________ Nurse's Signature

## 2012-03-30 ENCOUNTER — Telehealth: Payer: Self-pay | Admitting: Internal Medicine

## 2012-03-30 ENCOUNTER — Telehealth: Payer: Self-pay | Admitting: *Deleted

## 2012-03-30 NOTE — Telephone Encounter (Signed)
Per staff message I have adjusted appt. JMW  

## 2012-03-30 NOTE — Telephone Encounter (Signed)
called pt and moved  her appt time as dr mkm will be out of the office,ok per mkm

## 2012-04-02 ENCOUNTER — Telehealth: Payer: Self-pay | Admitting: Medical Oncology

## 2012-04-02 ENCOUNTER — Telehealth: Payer: Self-pay | Admitting: Internal Medicine

## 2012-04-02 NOTE — Telephone Encounter (Signed)
ppof from 10/31 cx the ct for 11/5 cand keep all other appts,done    aom

## 2012-04-02 NOTE — Telephone Encounter (Addendum)
She wants to cancel scan for 11/5 and continue chemo.She is just getting over cystitis and needs a break from doing another scan. She expressed that the she cannot afford scan. I explained to pt that the scan can help determine whether or not the chemo is working. Pt stated " I know the chemo is working . I feel really good" I told her to keep her appts as scheduled. Onc tx request sent to cancel CT

## 2012-04-07 ENCOUNTER — Ambulatory Visit (HOSPITAL_COMMUNITY): Payer: Medicare Other

## 2012-04-07 ENCOUNTER — Other Ambulatory Visit (HOSPITAL_COMMUNITY): Payer: Medicare Other

## 2012-04-09 ENCOUNTER — Ambulatory Visit (HOSPITAL_BASED_OUTPATIENT_CLINIC_OR_DEPARTMENT_OTHER): Payer: Medicare Other | Admitting: Physician Assistant

## 2012-04-09 ENCOUNTER — Other Ambulatory Visit: Payer: Medicare Other | Admitting: Lab

## 2012-04-09 ENCOUNTER — Ambulatory Visit (HOSPITAL_BASED_OUTPATIENT_CLINIC_OR_DEPARTMENT_OTHER): Payer: Medicare Other

## 2012-04-09 ENCOUNTER — Encounter: Payer: Self-pay | Admitting: Physician Assistant

## 2012-04-09 ENCOUNTER — Other Ambulatory Visit (HOSPITAL_BASED_OUTPATIENT_CLINIC_OR_DEPARTMENT_OTHER): Payer: Medicare Other | Admitting: Lab

## 2012-04-09 ENCOUNTER — Ambulatory Visit: Payer: Medicare Other | Admitting: Internal Medicine

## 2012-04-09 VITALS — BP 115/75 | HR 69 | Temp 97.1°F | Resp 20 | Ht 62.0 in | Wt 154.5 lb

## 2012-04-09 DIAGNOSIS — C3410 Malignant neoplasm of upper lobe, unspecified bronchus or lung: Secondary | ICD-10-CM

## 2012-04-09 DIAGNOSIS — C3430 Malignant neoplasm of lower lobe, unspecified bronchus or lung: Secondary | ICD-10-CM

## 2012-04-09 DIAGNOSIS — C801 Malignant (primary) neoplasm, unspecified: Secondary | ICD-10-CM

## 2012-04-09 DIAGNOSIS — C3490 Malignant neoplasm of unspecified part of unspecified bronchus or lung: Secondary | ICD-10-CM

## 2012-04-09 DIAGNOSIS — Z5111 Encounter for antineoplastic chemotherapy: Secondary | ICD-10-CM

## 2012-04-09 LAB — CBC WITH DIFFERENTIAL/PLATELET
BASO%: 0.9 % (ref 0.0–2.0)
Basophils Absolute: 0 10*3/uL (ref 0.0–0.1)
EOS%: 2.8 % (ref 0.0–7.0)
MCH: 32.5 pg (ref 25.1–34.0)
MCHC: 34 g/dL (ref 31.5–36.0)
MCV: 95.5 fL (ref 79.5–101.0)
MONO%: 12.9 % (ref 0.0–14.0)
RBC: 3.82 10*6/uL (ref 3.70–5.45)
RDW: 15 % — ABNORMAL HIGH (ref 11.2–14.5)
nRBC: 0 % (ref 0–0)

## 2012-04-09 LAB — COMPREHENSIVE METABOLIC PANEL (CC13)
AST: 39 U/L — ABNORMAL HIGH (ref 5–34)
Alkaline Phosphatase: 88 U/L (ref 40–150)
Glucose: 112 mg/dl — ABNORMAL HIGH (ref 70–99)
Sodium: 140 mEq/L (ref 136–145)
Total Bilirubin: 0.54 mg/dL (ref 0.20–1.20)
Total Protein: 6.1 g/dL — ABNORMAL LOW (ref 6.4–8.3)

## 2012-04-09 MED ORDER — ONDANSETRON 8 MG/50ML IVPB (CHCC)
8.0000 mg | Freq: Once | INTRAVENOUS | Status: AC
  Administered 2012-04-09: 8 mg via INTRAVENOUS

## 2012-04-09 MED ORDER — CYANOCOBALAMIN 1000 MCG/ML IJ SOLN
1000.0000 ug | Freq: Once | INTRAMUSCULAR | Status: AC
  Administered 2012-04-09: 1000 ug via INTRAMUSCULAR

## 2012-04-09 MED ORDER — SODIUM CHLORIDE 0.9 % IV SOLN
Freq: Once | INTRAVENOUS | Status: AC
  Administered 2012-04-09: 16:00:00 via INTRAVENOUS

## 2012-04-09 MED ORDER — SODIUM CHLORIDE 0.9 % IV SOLN
500.0000 mg/m2 | Freq: Once | INTRAVENOUS | Status: AC
  Administered 2012-04-09: 900 mg via INTRAVENOUS
  Filled 2012-04-09: qty 36

## 2012-04-09 MED ORDER — DEXAMETHASONE SODIUM PHOSPHATE 10 MG/ML IJ SOLN
10.0000 mg | Freq: Once | INTRAMUSCULAR | Status: AC
  Administered 2012-04-09: 10 mg via INTRAVENOUS

## 2012-04-09 MED ORDER — SODIUM CHLORIDE 0.9 % IJ SOLN
10.0000 mL | INTRAMUSCULAR | Status: DC | PRN
  Administered 2012-04-09: 10 mL
  Filled 2012-04-09: qty 10

## 2012-04-09 MED ORDER — HEPARIN SOD (PORK) LOCK FLUSH 100 UNIT/ML IV SOLN
500.0000 [IU] | Freq: Once | INTRAVENOUS | Status: AC | PRN
  Administered 2012-04-09: 500 [IU]
  Filled 2012-04-09: qty 5

## 2012-04-09 NOTE — Patient Instructions (Addendum)
[facility] Discharge Instructions for Patients Receiving Chemotherapy  Today you received the following chemotherapy agents Alimta.  To help prevent nausea and vomiting after your treatment, we encourage you to take your nausea medication.   If you develop nausea and vomiting that is not controlled by your nausea medication, call the clinic. If it is after clinic hours your family physician or the after hours number for the clinic or go to the Emergency Department.   BELOW ARE SYMPTOMS THAT SHOULD BE REPORTED IMMEDIATELY:  *FEVER GREATER THAN 100.5 F  *CHILLS WITH OR WITHOUT FEVER  NAUSEA AND VOMITING THAT IS NOT CONTROLLED WITH YOUR NAUSEA MEDICATION  *UNUSUAL SHORTNESS OF BREATH  *UNUSUAL BRUISING OR BLEEDING  TENDERNESS IN MOUTH AND THROAT WITH OR WITHOUT PRESENCE OF ULCERS  *URINARY PROBLEMS  *BOWEL PROBLEMS  UNUSUAL RASH Items with * indicate a potential emergency and should be followed up as soon as possible.  One of the nurses will contact you 24 hours after your treatment. Please let the nurse know about any problems that you may have experienced. Feel free to call the clinic you have any questions or concerns. The clinic phone number is [phone].   I have been informed and understand all the instructions given to me. I know to contact the clinic, my physician, or go to the Emergency Department if any problems should occur. I do not have any questions at this time, but understand that I may call the clinic during office hours   should I have any questions or need assistance in obtaining follow up care.    __________________________________________  _____________  __________ Signature of Patient or Authorized Representative            Date                   Time    __________________________________________ Nurse's Signature    

## 2012-04-15 NOTE — Progress Notes (Signed)
[facility] [phone]   Fax:(336) [phone]  OFFICE PROGRESS NOTE  [name],[name], MD [address] [place] [address]  Principle Diagnosis: Recurrent non-small cell lung cancer, initially diagnosed a stage IB (T2, N0, M0) adenocarcinoma in June 2008 with a tumor size of 8 cm.   Prior Therapy:  #1 status post left upper lobectomy with lymph node dissection under the care of Dr. Edwyna Shell on 06/29/2006.  #2 status post 4 cycles of adjuvant chemotherapy with cisplatin and Taxotere last dose given 08/01/2006.  #3 status post 6 cycles of systemic chemotherapy with carboplatin and Alimta for disease recurrence last dose given 12/12/2009.  #4 Maintenance chemotherapy with Alimta at 500 mg per meter squared given every 3 weeks status post 23 cycles.   Current therapy: Patient has resumed systemic chemotherapy with Alimta at 500 mg per meter squared given every 3 weeks status post 2 cycles. Of note the patient is not on dexamethasone with the Alimta do to rash/flushing from the dexamethasone.  INTERVAL HISTORY: [name] 78 y.o. female returns to the clinic today for a routine followup visit. She is tolerating resuming chemotherapy with Alimta relatively well. She voices no complaints today. She denied fever or chills. She denied any chest pain, shortness breath, hemoptysis, night sweats, nausea or vomiting, diarrhea or constipation.  She denied having any significant weight loss or night sweats. She requests to wait until January 2014 for any further CT scans do to the insurance coverage. She states that she cannot afford any further CT scans right now.  MEDICAL HISTORY: Past Medical History  Diagnosis Date  . lung ca dx'd 06/2006    rt and lt lung  . History of migraine headaches   . Hypercholesterolemia   . Lung cancer     ALLERGIES:  is allergic to ondansetron hcl; simvastatin; and iodinated diagnostic agents.  MEDICATIONS:  Current Outpatient  Prescriptions  Medication Sig Dispense Refill  . Calcium Carbonate-Vit D-Min (CALTRATE PLUS PO) Take 500 mg by mouth 2 (two) times daily.        . ciprofloxacin (CIPRO) 500 MG tablet Take 1 tablet (500 mg total) by mouth 2 (two) times daily.  6 tablet  0  . fish oil-omega-3 fatty acids 1000 MG capsule Take 2 g by mouth daily.        . folic acid (FOLVITE) 1 MG tablet Take 1 mg by mouth daily.        . influenza virus trivalent vaccine (FLUZONE) injection Inject 0.5 mLs into the muscle once.  0.5 mL  0  . meclizine (ANTIVERT) 25 MG tablet Take 12.5 mg by mouth 3 (three) times daily as needed.      . Multiple Vitamins-Minerals (CENTRUM SILVER PO) Take 1 tablet by mouth daily.        . prochlorperazine (COMPAZINE) 10 MG tablet Take 10 mg by mouth every 6 (six) hours as needed.        . ranitidine (ZANTAC) 75 MG tablet Take 75 mg by mouth. Pt takes about 3 times a week prn       . Thiamine HCl (VITAMIN B-1) 100 MG tablet Take by mouth daily.        . vitamin B-12 (CYANOCOBALAMIN) 500 MCG tablet Take 500 mcg by mouth daily.        REVIEW OF SYSTEMS:  A comprehensive review of systems was negative.   PHYSICAL EXAMINATION: General appearance: alert, cooperative and no distress Head: Normocephalic, without obvious  abnormality, atraumatic Neck: no adenopathy Lymph nodes: Cervical, supraclavicular, and axillary nodes normal. Resp: clear to auscultation bilaterally Cardio: regular rate and rhythm, S1, S2 normal, no murmur, click, rub or gallop GI: soft, non-tender; bowel sounds normal; no masses,  no organomegaly, no suprapubic tenderness Extremities: extremities normal, atraumatic, no cyanosis or edema Neurologic: Alert and oriented X 3, normal strength and tone. Normal symmetric reflexes. Normal coordination and gait  ECOG PERFORMANCE STATUS: 1 - Symptomatic but completely ambulatory  Blood pressure 115/75, pulse 69, temperature 97.1 F (36.2 C), temperature source Oral, resp. rate 20, height 5'  2" (1.575 m), weight 154 lb 8 oz (70.081 kg).  LABORATORY DATA: Lab Results  Component Value Date   WBC 4.3 04/09/2012   HGB 12.4 04/09/2012   HCT 36.5 04/09/2012   MCV 95.5 04/09/2012   PLT 237 04/09/2012      Chemistry      Component Value Date/Time   NA 140 04/09/2012 1411   NA 139 01/24/2012 1008   NA 140 08/21/2011 1306   K 3.3* 04/09/2012 1411   K 4.2 01/24/2012 1008   K 4.1 08/21/2011 1306   CL 109* 04/09/2012 1411   CL 103 01/24/2012 1008   CL 107 08/21/2011 1306   CO2 24 04/09/2012 1411   CO2 28 01/24/2012 1008   CO2 22 08/21/2011 1306   BUN 16.0 04/09/2012 1411   BUN 13 01/24/2012 1008   BUN 11 08/21/2011 1306   CREATININE 0.8 04/09/2012 1411   CREATININE 0.8 01/24/2012 1008   CREATININE 0.81 08/21/2011 1306      Component Value Date/Time   CALCIUM 8.7 04/09/2012 1411   CALCIUM 8.6 01/24/2012 1008   CALCIUM 8.8 08/21/2011 1306   ALKPHOS 88 04/09/2012 1411   ALKPHOS 79 01/24/2012 1008   ALKPHOS 87 08/21/2011 1306   AST 39* 04/09/2012 1411   AST 24 01/24/2012 1008   AST 17 08/21/2011 1306   ALT 14 04/09/2012 1411   ALT 8 08/21/2011 1306   BILITOT 0.54 04/09/2012 1411   BILITOT 0.90 01/24/2012 1008   BILITOT 0.6 08/21/2011 1306       RADIOGRAPHIC STUDIES:  Ct Chest Wo Contrast  01/24/2012  *RADIOLOGY REPORT*  Clinical Data:  Bilateral lung cancer.  Chemotherapy completed December 2013.  Cough.  CT CHEST, ABDOMEN AND PELVIS WITHOUT CONTRAST  Technique:  Multidetector CT imaging of the chest, abdomen and pelvis was performed following the standard protocol without IV contrast.  Comparison:  08/14/2011.   CT CHEST  Findings:  No pathologically enlarged mediastinal or axillary lymph nodes.  Hilar regions are difficult to definitively evaluate without IV contrast.  Heart size normal.  Coronary artery calcification.  Atherosclerotic calcification of the arterial vasculature, including coronary arteries.  No pericardial effusion.  Minimal biapical pleural parenchymal scarring.  A spiculated mass  in the posterior segment right upper lobe measures 2.4 x 3.0 cm (previously 2.1 x 1.3 cm).  There are adjacent spiculated nodules in the anterior right upper lobe.  Right middle lobe nodules have increased in size as well, with index lesion measuring 9 x 11 mm (previously 6 x 10 mm) on image 43.  Subpleural ground-glass nodular density in the left upper lobe (image 33) is stable.  Postoperative changes of left lower lobectomy with associated scarring and mild volume loss.  4 mm subpleural left upper lobe nodule (image 22), stable.  No pleural fluid.  Airway is otherwise unremarkable.  IMPRESSION:  Interval progression of disease as evidenced by an enlarging right  upper lobe mass and adjacent spiculated nodules in the right upper lobe and right middle lobe.   CT ABDOMEN AND PELVIS  Findings:  Liver, gallbladder and adrenal glands are unremarkable. An 8 mm low attenuation lesion in the interpolar right kidney is stable in size.  Spleen, pancreas, stomach and bowel are unremarkable.  A 4.6 x 3.1 cm low attenuation lesion in the right adnexa is unchanged.  Hysterectomy.  No pathologically enlarged lymph nodes. No free fluid.  Scattered atherosclerotic calcification of the arterial vasculature without abdominal aortic aneurysm.  No worrisome lytic or sclerotic lesions. Prominent Schmorl's nodes along superior endplates in the lower thoracic spine.  There may be a mild compression deformity about the superior plate of L1, unchanged.  IMPRESSION:  1.  No evidence of metastatic disease in the abdomen or pelvis. 2.  Stable right adnexal low attenuation lesion.   Original Report Authenticated By: Reyes Ivan, M.D.     ASSESSMENT/PLAN: This is a very pleasant 78 years old white female with recurrent non-small cell lung cancer status post systemic chemotherapy with carboplatin and Alimta followed by maintenance chemotherapy with single agent Alimta for 23 cycles that was discontinued based on the patient's wishes to  take a break from the treatment. Unfortunately she has evidence for disease progression on her last scan. She is now status post 3 cycles of her resumed systemic chemotherapy with Alimta 500 mg per meter squared. The patient was due for restaging CT scan however she canceled that. She is due for cycle 4 of her resumed systemic chemotherapy with Alimta today. I had a long discussion with the patient regarding her choice to not have restaging scans at this point. She is taking a risk that her cancer could be growing or spreading even with the current chemotherapy all, even though this chemotherapy works well for her in the past. Patient voiced understanding of the risks and still prefers to wait until January of 2014 before any further CT imaging is done. Prior discussion with Dr. Arbutus Ped was had regarding this issue. Patient was discussed Dr. Welton Flakes in Dr. Asa Lente absence.Marland Kitchen She will proceed with cycle #4 of her systemic chemotherapy with Alimta at 500 mg router squared given every 3 weeks. She'll followup in 3 weeks prior to her next scheduled cycle of systemic chemotherapy with Alimta with a repeat CBC differential and C. met.  Laural Benes, [name] E, [name]   All questions were answered. The patient knows to call the clinic with any problems, questions or concerns. We can certainly see the patient much sooner if necessary.  I spent 20 minutes counseling the patient face to face. The total time spent in the appointment was 30 minutes.

## 2012-04-16 ENCOUNTER — Telehealth: Payer: Self-pay | Admitting: *Deleted

## 2012-04-16 NOTE — Telephone Encounter (Signed)
Per staff message and POF I have scheduled appt.  JMW  

## 2012-04-29 ENCOUNTER — Ambulatory Visit (HOSPITAL_BASED_OUTPATIENT_CLINIC_OR_DEPARTMENT_OTHER): Payer: Medicare Other | Admitting: Internal Medicine

## 2012-04-29 ENCOUNTER — Other Ambulatory Visit (HOSPITAL_BASED_OUTPATIENT_CLINIC_OR_DEPARTMENT_OTHER): Payer: Medicare Other | Admitting: Lab

## 2012-04-29 ENCOUNTER — Ambulatory Visit (HOSPITAL_BASED_OUTPATIENT_CLINIC_OR_DEPARTMENT_OTHER): Payer: Medicare Other

## 2012-04-29 ENCOUNTER — Telehealth: Payer: Self-pay | Admitting: Internal Medicine

## 2012-04-29 VITALS — BP 133/69 | HR 69 | Temp 97.7°F | Resp 18 | Ht 62.0 in | Wt 151.8 lb

## 2012-04-29 DIAGNOSIS — C3490 Malignant neoplasm of unspecified part of unspecified bronchus or lung: Secondary | ICD-10-CM

## 2012-04-29 DIAGNOSIS — Z5111 Encounter for antineoplastic chemotherapy: Secondary | ICD-10-CM

## 2012-04-29 DIAGNOSIS — C3410 Malignant neoplasm of upper lobe, unspecified bronchus or lung: Secondary | ICD-10-CM

## 2012-04-29 DIAGNOSIS — R5381 Other malaise: Secondary | ICD-10-CM

## 2012-04-29 DIAGNOSIS — C801 Malignant (primary) neoplasm, unspecified: Secondary | ICD-10-CM

## 2012-04-29 LAB — COMPREHENSIVE METABOLIC PANEL (CC13)
AST: 26 U/L (ref 5–34)
Albumin: 3.6 g/dL (ref 3.5–5.0)
Alkaline Phosphatase: 88 U/L (ref 40–150)
Glucose: 83 mg/dl (ref 70–99)
Potassium: 4 mEq/L (ref 3.5–5.1)
Sodium: 142 mEq/L (ref 136–145)
Total Bilirubin: 0.77 mg/dL (ref 0.20–1.20)
Total Protein: 6.6 g/dL (ref 6.4–8.3)

## 2012-04-29 LAB — CBC WITH DIFFERENTIAL/PLATELET
BASO%: 0.6 % (ref 0.0–2.0)
EOS%: 2.5 % (ref 0.0–7.0)
Eosinophils Absolute: 0.1 10*3/uL (ref 0.0–0.5)
LYMPH%: 38.8 % (ref 14.0–49.7)
MCH: 32.7 pg (ref 25.1–34.0)
MCHC: 33.7 g/dL (ref 31.5–36.0)
MCV: 97.1 fL (ref 79.5–101.0)
MONO%: 13.6 % (ref 0.0–14.0)
NEUT#: 1.6 10*3/uL (ref 1.5–6.5)
Platelets: 267 10*3/uL (ref 145–400)
RBC: 4.07 10*6/uL (ref 3.70–5.45)
RDW: 15 % — ABNORMAL HIGH (ref 11.2–14.5)

## 2012-04-29 MED ORDER — DEXAMETHASONE SODIUM PHOSPHATE 10 MG/ML IJ SOLN
10.0000 mg | Freq: Once | INTRAMUSCULAR | Status: AC
  Administered 2012-04-29: 10 mg via INTRAVENOUS

## 2012-04-29 MED ORDER — SODIUM CHLORIDE 0.9 % IJ SOLN
10.0000 mL | INTRAMUSCULAR | Status: DC | PRN
  Administered 2012-04-29: 10 mL
  Filled 2012-04-29: qty 10

## 2012-04-29 MED ORDER — SODIUM CHLORIDE 0.9 % IV SOLN
Freq: Once | INTRAVENOUS | Status: AC
  Administered 2012-04-29: 10:00:00 via INTRAVENOUS

## 2012-04-29 MED ORDER — ONDANSETRON 8 MG/50ML IVPB (CHCC)
8.0000 mg | Freq: Once | INTRAVENOUS | Status: AC
  Administered 2012-04-29: 8 mg via INTRAVENOUS

## 2012-04-29 MED ORDER — SODIUM CHLORIDE 0.9 % IV SOLN
500.0000 mg/m2 | Freq: Once | INTRAVENOUS | Status: AC
  Administered 2012-04-29: 900 mg via INTRAVENOUS
  Filled 2012-04-29: qty 36

## 2012-04-29 MED ORDER — HEPARIN SOD (PORK) LOCK FLUSH 100 UNIT/ML IV SOLN
500.0000 [IU] | Freq: Once | INTRAVENOUS | Status: AC | PRN
  Administered 2012-04-29: 500 [IU]
  Filled 2012-04-29: qty 5

## 2012-04-29 NOTE — Patient Instructions (Signed)
[facility] Discharge Instructions for Patients Receiving Chemotherapy  Today you received the following chemotherapy agent Alimat.  To help prevent nausea and vomiting after your treatment, we encourage you to take your nausea medication. Begin taking your nausea medication as often as prescribed for by Dr. Arbutus Ped.    If you develop nausea and vomiting that is not controlled by your nausea medication, call the clinic. If it is after clinic hours your family physician or the after hours number for the clinic or go to the Emergency Department.   BELOW ARE SYMPTOMS THAT SHOULD BE REPORTED IMMEDIATELY:  *FEVER GREATER THAN 100.5 F  *CHILLS WITH OR WITHOUT FEVER  NAUSEA AND VOMITING THAT IS NOT CONTROLLED WITH YOUR NAUSEA MEDICATION  *UNUSUAL SHORTNESS OF BREATH  *UNUSUAL BRUISING OR BLEEDING  TENDERNESS IN MOUTH AND THROAT WITH OR WITHOUT PRESENCE OF ULCERS  *URINARY PROBLEMS  *BOWEL PROBLEMS  UNUSUAL RASH Items with * indicate a potential emergency and should be followed up as soon as possible.  One of the nurses will contact you 24 hours after your treatment. Please let the nurse know about any problems that you may have experienced. Feel free to call the clinic you have any questions or concerns. The clinic phone number is [phone].   I have been informed and understand all the instructions given to me. I know to contact the clinic, my physician, or go to the Emergency Department if any problems should occur. I do not have any questions at this time, but understand that I may call the clinic during office hours   should I have any questions or need assistance in obtaining follow up care.    __________________________________________  _____________  __________ Signature of Patient or Authorized Representative            Date                   Time    __________________________________________ Nurse's Signature

## 2012-04-29 NOTE — Progress Notes (Signed)
[facility] [phone]   [phone]  OFFICE PROGRESS NOTE  [name],[name], MD [address] [place] [address]  Principle Diagnosis: Recurrent non-small cell lung cancer, initially diagnosed a stage IB (T2, N0, M0) adenocarcinoma in June 2008 with a tumor size of 8 cm.   Prior Therapy:  #1 status post left upper lobectomy with lymph node dissection under the care of Dr. Edwyna Shell on 06/29/2006.  #2 status post 4 cycles of adjuvant chemotherapy with cisplatin and Taxotere last dose given 08/01/2006.  #3 status post 6 cycles of systemic chemotherapy with carboplatin and Alimta for disease recurrence last dose given 12/12/2009.  #4 Maintenance chemotherapy with Alimta at 500 mg per meter squared given every 3 weeks status post 23 cycles.   Current therapy: Patient has resumed systemic chemotherapy with Alimta at 500 mg per meter squared given every 3 weeks status post 3 cycles. Of note the patient is not on dexamethasone with the Alimta due to rash/flushing from the dexamethasone.  INTERVAL HISTORY: [name] 78 y.o. female returns to the clinic today for routine followup visit. The patient is feeling fine with no specific complaints today. She is tolerating her treatment with maintenance Alimta fairly well except for 1-2 days of fatigue after the chemotherapy. The patient denied having any significant nausea or vomiting, no fever or chills. She denied having any chest pain, shortness breath, cough or hemoptysis. She requested delaying the staging scan until after January because of changing her insurance.  MEDICAL HISTORY: Past Medical History  Diagnosis Date  . lung ca dx'd 06/2006    rt and lt lung  . History of migraine headaches   . Hypercholesterolemia   . Lung cancer     ALLERGIES:  is allergic to ondansetron hcl; simvastatin; and iodinated diagnostic agents.  MEDICATIONS:  Current Outpatient Prescriptions  Medication Sig  Dispense Refill  . Calcium Carbonate-Vit D-Min (CALTRATE PLUS PO) Take 500 mg by mouth 2 (two) times daily.        . fish oil-omega-3 fatty acids 1000 MG capsule Take 2 g by mouth daily.        . folic acid (FOLVITE) 1 MG tablet Take 1 mg by mouth daily.        . meclizine (ANTIVERT) 25 MG tablet Take 12.5 mg by mouth 3 (three) times daily as needed.      . Multiple Vitamins-Minerals (CENTRUM SILVER PO) Take 1 tablet by mouth daily.        . Thiamine HCl (VITAMIN B-1) 100 MG tablet Take by mouth daily.        . vitamin B-12 (CYANOCOBALAMIN) 500 MCG tablet Take 500 mcg by mouth daily.      . influenza virus trivalent vaccine (FLUZONE) injection Inject 0.5 mLs into the muscle once.  0.5 mL  0  . prochlorperazine (COMPAZINE) 10 MG tablet Take 10 mg by mouth every 6 (six) hours as needed.        . ranitidine (ZANTAC) 75 MG tablet Take 75 mg by mouth. Pt takes about 3 times a week prn         REVIEW OF SYSTEMS:  A comprehensive review of systems was negative except for: Constitutional: positive for fatigue   PHYSICAL EXAMINATION: General appearance: alert, cooperative and no distress Head: Normocephalic, without obvious abnormality, atraumatic Neck: no adenopathy Lymph nodes: Cervical, supraclavicular, and axillary nodes normal. Resp: clear to auscultation bilaterally Cardio: regular rate and rhythm, S1, S2 normal, no  murmur, click, rub or gallop GI: soft, non-tender; bowel sounds normal; no masses,  no organomegaly Extremities: extremities normal, atraumatic, no cyanosis or edema Neurologic: Alert and oriented X 3, normal strength and tone. Normal symmetric reflexes. Normal coordination and gait  ECOG PERFORMANCE STATUS: 1 - Symptomatic but completely ambulatory  Blood pressure 133/69, pulse 69, temperature 97.7 F (36.5 C), resp. rate 18, height 5\' 2"  (1.575 m), weight 151 lb 12.8 oz (68.856 kg).  LABORATORY DATA: Lab Results  Component Value Date   WBC 3.6* 04/29/2012   HGB 13.3  04/29/2012   HCT 39.5 04/29/2012   MCV 97.1 04/29/2012   PLT 267 04/29/2012      Chemistry      Component Value Date/Time   NA 140 04/09/2012 1411   NA 139 01/24/2012 1008   NA 140 08/21/2011 1306   K 3.3* 04/09/2012 1411   K 4.2 01/24/2012 1008   K 4.1 08/21/2011 1306   CL 109* 04/09/2012 1411   CL 103 01/24/2012 1008   CL 107 08/21/2011 1306   CO2 24 04/09/2012 1411   CO2 28 01/24/2012 1008   CO2 22 08/21/2011 1306   BUN 16.0 04/09/2012 1411   BUN 13 01/24/2012 1008   BUN 11 08/21/2011 1306   CREATININE 0.8 04/09/2012 1411   CREATININE 0.8 01/24/2012 1008   CREATININE 0.81 08/21/2011 1306      Component Value Date/Time   CALCIUM 8.7 04/09/2012 1411   CALCIUM 8.6 01/24/2012 1008   CALCIUM 8.8 08/21/2011 1306   ALKPHOS 88 04/09/2012 1411   ALKPHOS 79 01/24/2012 1008   ALKPHOS 87 08/21/2011 1306   AST 39* 04/09/2012 1411   AST 24 01/24/2012 1008   AST 17 08/21/2011 1306   ALT 14 04/09/2012 1411   ALT 8 08/21/2011 1306   BILITOT 0.54 04/09/2012 1411   BILITOT 0.90 01/24/2012 1008   BILITOT 0.6 08/21/2011 1306       RADIOGRAPHIC STUDIES: No results found.  ASSESSMENT: This is a very pleasant 78 years old white female with metastatic non-small cell lung cancer currently on systemic chemotherapy with single agent Alimta. The patient is tolerating her treatment fairly well with no significant adverse effects except for mild fatigue.  PLAN: We will proceed with her systemic chemotherapy with single agent Alimta as scheduled. The patient would come back for followup visit in 3 weeks with the next cycle of her chemotherapy. We will delay the staging scan of the chest, abdomen and pelvis until after January 2014 based on the patient's request because of change in her insurance. She was advised to call immediately if she has any concerning symptoms in the interval.  All questions were answered. The patient knows to call the clinic with any problems, questions or concerns. We can certainly see the patient  much sooner if necessary.  I spent 15 minutes counseling the patient face to face. The total time spent in the appointment was 25 minutes.

## 2012-04-29 NOTE — Telephone Encounter (Signed)
gv pt appt schedule for December 2013 and January 2014. °

## 2012-05-01 ENCOUNTER — Other Ambulatory Visit: Payer: Medicare Other | Admitting: Lab

## 2012-05-01 NOTE — Patient Instructions (Signed)
Your labwork is good today. We'll proceed with systemic chemotherapy with Alimta as scheduled. Followup in 3 weeks.

## 2012-05-21 ENCOUNTER — Other Ambulatory Visit (HOSPITAL_BASED_OUTPATIENT_CLINIC_OR_DEPARTMENT_OTHER): Payer: Medicare Other | Admitting: Lab

## 2012-05-21 ENCOUNTER — Ambulatory Visit (HOSPITAL_BASED_OUTPATIENT_CLINIC_OR_DEPARTMENT_OTHER): Payer: Medicare Other

## 2012-05-21 ENCOUNTER — Telehealth: Payer: Self-pay | Admitting: *Deleted

## 2012-05-21 ENCOUNTER — Encounter: Payer: Self-pay | Admitting: Physician Assistant

## 2012-05-21 ENCOUNTER — Ambulatory Visit (HOSPITAL_BASED_OUTPATIENT_CLINIC_OR_DEPARTMENT_OTHER): Payer: Medicare Other | Admitting: Physician Assistant

## 2012-05-21 ENCOUNTER — Telehealth: Payer: Self-pay | Admitting: Internal Medicine

## 2012-05-21 VITALS — BP 129/65 | HR 72 | Temp 98.5°F | Resp 20 | Ht 62.0 in | Wt 153.3 lb

## 2012-05-21 DIAGNOSIS — C3410 Malignant neoplasm of upper lobe, unspecified bronchus or lung: Secondary | ICD-10-CM

## 2012-05-21 DIAGNOSIS — C3490 Malignant neoplasm of unspecified part of unspecified bronchus or lung: Secondary | ICD-10-CM

## 2012-05-21 DIAGNOSIS — Z5111 Encounter for antineoplastic chemotherapy: Secondary | ICD-10-CM

## 2012-05-21 DIAGNOSIS — C3430 Malignant neoplasm of lower lobe, unspecified bronchus or lung: Secondary | ICD-10-CM

## 2012-05-21 DIAGNOSIS — C801 Malignant (primary) neoplasm, unspecified: Secondary | ICD-10-CM

## 2012-05-21 LAB — CBC WITH DIFFERENTIAL/PLATELET
BASO%: 0.3 % (ref 0.0–2.0)
EOS%: 1.3 % (ref 0.0–7.0)
MCH: 32.7 pg (ref 25.1–34.0)
MCHC: 33.2 g/dL (ref 31.5–36.0)
RDW: 15.2 % — ABNORMAL HIGH (ref 11.2–14.5)
lymph#: 1.2 10*3/uL (ref 0.9–3.3)

## 2012-05-21 LAB — COMPREHENSIVE METABOLIC PANEL (CC13)
AST: 21 U/L (ref 5–34)
Albumin: 3.2 g/dL — ABNORMAL LOW (ref 3.5–5.0)
Alkaline Phosphatase: 83 U/L (ref 40–150)
BUN: 18 mg/dL (ref 7.0–26.0)
Potassium: 4.1 mEq/L (ref 3.5–5.1)
Sodium: 140 mEq/L (ref 136–145)
Total Bilirubin: 0.53 mg/dL (ref 0.20–1.20)
Total Protein: 6.2 g/dL — ABNORMAL LOW (ref 6.4–8.3)

## 2012-05-21 MED ORDER — ONDANSETRON 8 MG/50ML IVPB (CHCC)
8.0000 mg | Freq: Once | INTRAVENOUS | Status: AC
  Administered 2012-05-21: 8 mg via INTRAVENOUS

## 2012-05-21 MED ORDER — SODIUM CHLORIDE 0.9 % IV SOLN
Freq: Once | INTRAVENOUS | Status: AC
  Administered 2012-05-21: 11:00:00 via INTRAVENOUS

## 2012-05-21 MED ORDER — SODIUM CHLORIDE 0.9 % IV SOLN
500.0000 mg/m2 | Freq: Once | INTRAVENOUS | Status: AC
  Administered 2012-05-21: 900 mg via INTRAVENOUS
  Filled 2012-05-21: qty 36

## 2012-05-21 MED ORDER — DEXAMETHASONE SODIUM PHOSPHATE 10 MG/ML IJ SOLN
10.0000 mg | Freq: Once | INTRAMUSCULAR | Status: AC
  Administered 2012-05-21: 10 mg via INTRAVENOUS

## 2012-05-21 MED ORDER — SODIUM CHLORIDE 0.9 % IJ SOLN
10.0000 mL | INTRAMUSCULAR | Status: DC | PRN
  Administered 2012-05-21: 10 mL
  Filled 2012-05-21: qty 10

## 2012-05-21 MED ORDER — HEPARIN SOD (PORK) LOCK FLUSH 100 UNIT/ML IV SOLN
500.0000 [IU] | Freq: Once | INTRAVENOUS | Status: AC | PRN
  Administered 2012-05-21: 500 [IU]
  Filled 2012-05-21: qty 5

## 2012-05-21 NOTE — Telephone Encounter (Signed)
appts made and printed for pt,pt aware that chemo will follow md

## 2012-05-21 NOTE — Patient Instructions (Signed)
[facility] Discharge Instructions for Patients Receiving Chemotherapy  Today you received the following chemotherapy agents Alimta To help prevent nausea and vomiting after your treatment, we encourage you to take your nausea medication as prescribed.  If you develop nausea and vomiting that is not controlled by your nausea medication, call the clinic. If it is after clinic hours your family physician or the after hours number for the clinic or go to the Emergency Department.   BELOW ARE SYMPTOMS THAT SHOULD BE REPORTED IMMEDIATELY:  *FEVER GREATER THAN 100.5 F  *CHILLS WITH OR WITHOUT FEVER  NAUSEA AND VOMITING THAT IS NOT CONTROLLED WITH YOUR NAUSEA MEDICATION  *UNUSUAL SHORTNESS OF BREATH  *UNUSUAL BRUISING OR BLEEDING  TENDERNESS IN MOUTH AND THROAT WITH OR WITHOUT PRESENCE OF ULCERS  *URINARY PROBLEMS  *BOWEL PROBLEMS  UNUSUAL RASH Items with * indicate a potential emergency and should be followed up as soon as possible.  One of the nurses will contact you 24 hours after your treatment. Please let the nurse know about any problems that you may have experienced. Feel free to call the clinic you have any questions or concerns. The clinic phone number is [phone].   I have been informed and understand all the instructions given to me. I know to contact the clinic, my physician, or go to the Emergency Department if any problems should occur. I do not have any questions at this time, but understand that I may call the clinic during office hours   should I have any questions or need assistance in obtaining follow up care.    __________________________________________  _____________  __________ Signature of Patient or Authorized Representative            Date                   Time    __________________________________________ Nurse's Signature    

## 2012-05-21 NOTE — Progress Notes (Signed)
Ok to treat with ANC 1.4 per Tiana Loft, PA

## 2012-05-21 NOTE — Telephone Encounter (Signed)
Per staff message and POF I have scheduled appts.  JMW  

## 2012-05-21 NOTE — Patient Instructions (Addendum)
Follow up with Dr. Mohamed in 3 weeks with a restaging CT scan of your chest, abdomen and pelvis to re-evaluate your disease 

## 2012-05-22 NOTE — Progress Notes (Signed)
[facility] [phone]   Fax:([phone]) [phone]  OFFICE PROGRESS NOTE  [name],[name], MD [address] [place] [address]  Principle Diagnosis: Recurrent non-small cell lung cancer, initially diagnosed a stage IB (T2, N0, M0) adenocarcinoma in June 2008 with a tumor size of 8 cm.   Prior Therapy:  #1 status post left upper lobectomy with lymph node dissection under the care of Dr. Edwyna Shell on 06/29/2006.  #2 status post 4 cycles of adjuvant chemotherapy with cisplatin and Taxotere last dose given 08/01/2006.  #3 status post 6 cycles of systemic chemotherapy with carboplatin and Alimta for disease recurrence last dose given 12/12/2009.  #4 Maintenance chemotherapy with Alimta at 500 mg per meter squared given every 3 weeks status post 23 cycles.   Current therapy: Patient has resumed systemic chemotherapy with Alimta at 500 mg per meter squared given every 3 weeks status post 5 cycles. Of note the patient is not on dexamethasone with the Alimta due to rash/flushing from the dexamethasone.  INTERVAL HISTORY: [name] 78 y.o. female returns to the clinic today for routine followup visit. The patient is feeling fine with no specific complaints today. She is tolerating her treatment with maintenance Alimta fairly well except for 1-2 days of fatigue after the chemotherapy. The patient denied having any significant nausea or vomiting, no fever or chills. She denied having any chest pain, shortness breath, cough or hemoptysis.   MEDICAL HISTORY: Past Medical History  Diagnosis Date  . lung ca dx'd 06/2006    rt and lt lung  . History of migraine headaches   . Hypercholesterolemia   . Lung cancer     ALLERGIES:  is allergic to ondansetron hcl; simvastatin; and iodinated diagnostic agents.  MEDICATIONS:  Current Outpatient Prescriptions  Medication Sig Dispense Refill  . Calcium Carbonate-Vit D-Min (CALTRATE PLUS PO) Take 500 mg by mouth 2 (two)  times daily.        . fish oil-omega-3 fatty acids 1000 MG capsule Take 2 g by mouth daily.        . folic acid (FOLVITE) 1 MG tablet Take 1 mg by mouth daily.        . influenza virus trivalent vaccine (FLUZONE) injection Inject 0.5 mLs into the muscle once.  0.5 mL  0  . meclizine (ANTIVERT) 25 MG tablet Take 12.5 mg by mouth 3 (three) times daily as needed.      . Multiple Vitamins-Minerals (CENTRUM SILVER PO) Take 1 tablet by mouth daily.        . prochlorperazine (COMPAZINE) 10 MG tablet Take 10 mg by mouth every 6 (six) hours as needed.        . ranitidine (ZANTAC) 75 MG tablet Take 75 mg by mouth. Pt takes about 3 times a week prn       . Thiamine HCl (VITAMIN B-1) 100 MG tablet Take by mouth daily.        . vitamin B-12 (CYANOCOBALAMIN) 500 MCG tablet Take 500 mcg by mouth daily.        REVIEW OF SYSTEMS:  A comprehensive review of systems was negative except for: Constitutional: positive for fatigue   PHYSICAL EXAMINATION: General appearance: alert, cooperative and no distress Head: Normocephalic, without obvious abnormality, atraumatic Neck: no adenopathy Lymph nodes: Cervical, supraclavicular, and axillary nodes normal. Resp: clear to auscultation bilaterally Cardio: regular rate and rhythm, S1, S2 normal, no murmur, click, rub or gallop GI: soft, non-tender; bowel sounds normal; no masses,  no organomegaly Extremities: extremities normal, atraumatic, no cyanosis or edema Neurologic: Alert and oriented X 3, normal strength and tone. Normal symmetric reflexes. Normal coordination and gait  ECOG PERFORMANCE STATUS: 1 - Symptomatic but completely ambulatory  Blood pressure 129/65, pulse 72, temperature 98.5 F (36.9 C), temperature source Oral, resp. rate 20, height 5\' 2"  (1.575 m), weight 153 lb 4.8 oz (69.536 kg).  LABORATORY DATA: Lab Results  Component Value Date   WBC 3.0* 05/21/2012   HGB 12.8 05/21/2012   HCT 38.6 05/21/2012   MCV 98.7 05/21/2012   PLT 230  05/21/2012      Chemistry      Component Value Date/Time   NA 140 05/21/2012 0926   NA 139 01/24/2012 1008   NA 140 08/21/2011 1306   K 4.1 05/21/2012 0926   K 4.2 01/24/2012 1008   K 4.1 08/21/2011 1306   CL 109* 05/21/2012 0926   CL 103 01/24/2012 1008   CL 107 08/21/2011 1306   CO2 23 05/21/2012 0926   CO2 28 01/24/2012 1008   CO2 22 08/21/2011 1306   BUN 18.0 05/21/2012 0926   BUN 13 01/24/2012 1008   BUN 11 08/21/2011 1306   CREATININE 0.7 05/21/2012 0926   CREATININE 0.8 01/24/2012 1008   CREATININE 0.81 08/21/2011 1306      Component Value Date/Time   CALCIUM 8.7 05/21/2012 0926   CALCIUM 8.6 01/24/2012 1008   CALCIUM 8.8 08/21/2011 1306   ALKPHOS 83 05/21/2012 0926   ALKPHOS 79 01/24/2012 1008   ALKPHOS 87 08/21/2011 1306   AST 21 05/21/2012 0926   AST 24 01/24/2012 1008   AST 17 08/21/2011 1306   ALT 14 05/21/2012 0926   ALT 8 08/21/2011 1306   BILITOT 0.53 05/21/2012 0926   BILITOT 0.90 01/24/2012 1008   BILITOT 0.6 08/21/2011 1306       RADIOGRAPHIC STUDIES: No results found.  ASSESSMENT/PLAN: This is a very pleasant 78 years old white female with metastatic non-small cell lung cancer currently on systemic chemotherapy with single agent Alimta. The patient is tolerating her treatment fairly well with no significant adverse effects except for mild fatigue. The patient was discussed with Dr. Arbutus Ped. She'll proceed with her systemic chemotherapy with single agent Alimta as scheduled today. She will followup with Dr. Arbutus Ped in 3 weeks with repeat CBC differential and C. met as well as a CT of the chest, abdomen and pelvis without contrast to reevaluate her disease.  Laural Benes, Janira Mandell E, [name]   All questions were answered. The patient knows to call the clinic with any problems, questions or concerns. We can certainly see the patient much sooner if necessary.  I spent 20 minutes counseling the patient face to face. The total time spent in the appointment was 30 minutes.

## 2012-06-04 ENCOUNTER — Other Ambulatory Visit: Payer: Self-pay | Admitting: *Deleted

## 2012-06-08 ENCOUNTER — Ambulatory Visit (HOSPITAL_COMMUNITY)
Admission: RE | Admit: 2012-06-08 | Discharge: 2012-06-08 | Disposition: A | Discharge status: Home / Self Care | Payer: Medicare Other | Source: Ambulatory Visit | Attending: Physician Assistant | Admitting: Physician Assistant

## 2012-06-08 DIAGNOSIS — C3490 Malignant neoplasm of unspecified part of unspecified bronchus or lung: Secondary | ICD-10-CM | POA: Insufficient documentation

## 2012-06-08 DIAGNOSIS — N9489 Other specified conditions associated with female genital organs and menstrual cycle: Secondary | ICD-10-CM | POA: Insufficient documentation

## 2012-06-08 DIAGNOSIS — R222 Localized swelling, mass and lump, trunk: Secondary | ICD-10-CM | POA: Insufficient documentation

## 2012-06-10 ENCOUNTER — Encounter: Payer: Self-pay | Admitting: Pharmacist

## 2012-06-11 ENCOUNTER — Telehealth: Payer: Self-pay | Admitting: *Deleted

## 2012-06-11 ENCOUNTER — Ambulatory Visit (HOSPITAL_BASED_OUTPATIENT_CLINIC_OR_DEPARTMENT_OTHER): Payer: Medicare Other | Admitting: Internal Medicine

## 2012-06-11 ENCOUNTER — Other Ambulatory Visit: Payer: Medicare Other | Admitting: Lab

## 2012-06-11 ENCOUNTER — Telehealth: Payer: Self-pay | Admitting: Internal Medicine

## 2012-06-11 ENCOUNTER — Ambulatory Visit (HOSPITAL_BASED_OUTPATIENT_CLINIC_OR_DEPARTMENT_OTHER): Payer: Medicare Other

## 2012-06-11 ENCOUNTER — Other Ambulatory Visit (HOSPITAL_BASED_OUTPATIENT_CLINIC_OR_DEPARTMENT_OTHER): Payer: Medicare Other | Admitting: Lab

## 2012-06-11 VITALS — BP 139/76 | HR 69 | Temp 97.8°F | Resp 20 | Ht 62.0 in | Wt 152.9 lb

## 2012-06-11 DIAGNOSIS — C801 Malignant (primary) neoplasm, unspecified: Secondary | ICD-10-CM

## 2012-06-11 DIAGNOSIS — C3430 Malignant neoplasm of lower lobe, unspecified bronchus or lung: Secondary | ICD-10-CM

## 2012-06-11 DIAGNOSIS — C3410 Malignant neoplasm of upper lobe, unspecified bronchus or lung: Secondary | ICD-10-CM

## 2012-06-11 DIAGNOSIS — C3490 Malignant neoplasm of unspecified part of unspecified bronchus or lung: Secondary | ICD-10-CM

## 2012-06-11 DIAGNOSIS — Z5111 Encounter for antineoplastic chemotherapy: Secondary | ICD-10-CM

## 2012-06-11 LAB — CBC WITH DIFFERENTIAL/PLATELET
BASO%: 0.6 % (ref 0.0–2.0)
EOS%: 0.9 % (ref 0.0–7.0)
HCT: 40.1 % (ref 34.8–46.6)
LYMPH%: 42 % (ref 14.0–49.7)
MCH: 32.7 pg (ref 25.1–34.0)
MCHC: 33.2 g/dL (ref 31.5–36.0)
NEUT%: 43.2 % (ref 38.4–76.8)
Platelets: 251 10*3/uL (ref 145–400)
RBC: 4.07 10*6/uL (ref 3.70–5.45)
WBC: 3.4 10*3/uL — ABNORMAL LOW (ref 3.9–10.3)
nRBC: 0 % (ref 0–0)

## 2012-06-11 LAB — COMPREHENSIVE METABOLIC PANEL (CC13)
ALT: 10 U/L (ref 0–55)
AST: 25 U/L (ref 5–34)
Alkaline Phosphatase: 88 U/L (ref 40–150)
BUN: 18 mg/dL (ref 7.0–26.0)
Creatinine: 0.7 mg/dL (ref 0.6–1.1)
Total Bilirubin: 0.75 mg/dL (ref 0.20–1.20)

## 2012-06-11 MED ORDER — DEXAMETHASONE SODIUM PHOSPHATE 10 MG/ML IJ SOLN
10.0000 mg | Freq: Once | INTRAMUSCULAR | Status: AC
  Administered 2012-06-11: 10 mg via INTRAVENOUS

## 2012-06-11 MED ORDER — SODIUM CHLORIDE 0.9 % IV SOLN
500.0000 mg/m2 | Freq: Once | INTRAVENOUS | Status: AC
  Administered 2012-06-11: 900 mg via INTRAVENOUS
  Filled 2012-06-11: qty 36

## 2012-06-11 MED ORDER — ONDANSETRON 8 MG/50ML IVPB (CHCC)
8.0000 mg | Freq: Once | INTRAVENOUS | Status: AC
  Administered 2012-06-11: 8 mg via INTRAVENOUS

## 2012-06-11 MED ORDER — SODIUM CHLORIDE 0.9 % IJ SOLN
10.0000 mL | INTRAMUSCULAR | Status: DC | PRN
  Administered 2012-06-11: 10 mL
  Filled 2012-06-11: qty 10

## 2012-06-11 MED ORDER — SODIUM CHLORIDE 0.9 % IV SOLN
Freq: Once | INTRAVENOUS | Status: AC
  Administered 2012-06-11: 13:00:00 via INTRAVENOUS

## 2012-06-11 MED ORDER — CYANOCOBALAMIN 1000 MCG/ML IJ SOLN
1000.0000 ug | Freq: Once | INTRAMUSCULAR | Status: AC
  Administered 2012-06-11: 1000 ug via INTRAMUSCULAR

## 2012-06-11 MED ORDER — HEPARIN SOD (PORK) LOCK FLUSH 100 UNIT/ML IV SOLN
500.0000 [IU] | Freq: Once | INTRAVENOUS | Status: AC | PRN
  Administered 2012-06-11: 500 [IU]
  Filled 2012-06-11: qty 5

## 2012-06-11 NOTE — Progress Notes (Signed)
[facility] [phone]   Fax:(336) [phone]  OFFICE PROGRESS NOTE  [name],[name], MD [address] [place] [address]  Principle Diagnosis: Recurrent non-small cell lung cancer, initially diagnosed a stage IB (T2, N0, M0) adenocarcinoma in June 2008 with a tumor size of 8 cm.   Prior Therapy:  #1 status post left upper lobectomy with lymph node dissection under the care of Dr. Edwyna Shell on 06/29/2006.  #2 status post 4 cycles of adjuvant chemotherapy with cisplatin and Taxotere last dose given 08/01/2006.  #3 status post 6 cycles of systemic chemotherapy with carboplatin and Alimta for disease recurrence last dose given 12/12/2009.  #4 Maintenance chemotherapy with Alimta at 500 mg per meter squared given every 3 weeks status post 23 cycles.   Current therapy: Patient has resumed systemic chemotherapy with Alimta at 500 mg per meter squared given every 3 weeks status post 6 cycles. Of note the patient is not on dexamethasone with the Alimta due to rash/flushing from the dexamethasone.  INTERVAL HISTORY: [name] 79 y.o. female returns to the clinic today for followup visit. The patient is feeling fine with no specific complaints. She is tolerating her maintenance chemotherapy with single agent Alimta fairly well. The patient denied having any significant chest pain, shortness breath, cough or hemoptysis. The patient denied having any significant weight loss or night sweats. She had repeat CT scan of the chest, abdomen and pelvis performed recently and she is here today for evaluation and discussion of her scan results.  MEDICAL HISTORY: Past Medical History  Diagnosis Date  . lung ca dx'd 06/2006    rt and lt lung  . History of migraine headaches   . Hypercholesterolemia   . Lung cancer     ALLERGIES:  is allergic to ondansetron hcl; simvastatin; and iodinated diagnostic agents.  MEDICATIONS:  Current Outpatient Prescriptions    Medication Sig Dispense Refill  . Calcium Carbonate-Vit D-Min (CALTRATE PLUS PO) Take 500 mg by mouth 2 (two) times daily.        . folic acid (FOLVITE) 1 MG tablet Take 1 mg by mouth daily.        . Multiple Vitamins-Minerals (CENTRUM SILVER PO) Take 1 tablet by mouth daily.        . vitamin B-12 (CYANOCOBALAMIN) 500 MCG tablet Take 500 mcg by mouth daily.      . influenza virus trivalent vaccine (FLUZONE) injection Inject 0.5 mLs into the muscle once.  0.5 mL  0  . prochlorperazine (COMPAZINE) 10 MG tablet Take 10 mg by mouth every 6 (six) hours as needed.        . ranitidine (ZANTAC) 75 MG tablet Take 75 mg by mouth. Pt takes about 3 times a week prn         REVIEW OF SYSTEMS:  A comprehensive review of systems was negative.   PHYSICAL EXAMINATION: General appearance: alert, cooperative and no distress Head: Normocephalic, without obvious abnormality, atraumatic Neck: no adenopathy Lymph nodes: Cervical, supraclavicular, and axillary nodes normal. Resp: clear to auscultation bilaterally Cardio: regular rate and rhythm, S1, S2 normal, no murmur, click, rub or gallop GI: soft, non-tender; bowel sounds normal; no masses,  no organomegaly Extremities: extremities normal, atraumatic, no cyanosis or edema Neurologic: Alert and oriented X 3, normal strength and tone. Normal symmetric reflexes. Normal coordination and gait  ECOG PERFORMANCE STATUS: 0 - Asymptomatic  There were no vitals taken for this visit.  LABORATORY DATA: Lab Results  Component  Value Date   WBC 3.4* 06/11/2012   HGB 13.3 06/11/2012   HCT 40.1 06/11/2012   MCV 98.5 06/11/2012   PLT 251 06/11/2012      Chemistry      Component Value Date/Time   NA 140 05/21/2012 0926   NA 139 01/24/2012 1008   NA 140 08/21/2011 1306   K 4.1 05/21/2012 0926   K 4.2 01/24/2012 1008   K 4.1 08/21/2011 1306   CL 109* 05/21/2012 0926   CL 103 01/24/2012 1008   CL 107 08/21/2011 1306   CO2 23 05/21/2012 0926   CO2 28 01/24/2012 1008   CO2  22 08/21/2011 1306   BUN 18.0 05/21/2012 0926   BUN 13 01/24/2012 1008   BUN 11 08/21/2011 1306   CREATININE 0.7 05/21/2012 0926   CREATININE 0.8 01/24/2012 1008   CREATININE 0.81 08/21/2011 1306      Component Value Date/Time   CALCIUM 8.7 05/21/2012 0926   CALCIUM 8.6 01/24/2012 1008   CALCIUM 8.8 08/21/2011 1306   ALKPHOS 83 05/21/2012 0926   ALKPHOS 79 01/24/2012 1008   ALKPHOS 87 08/21/2011 1306   AST 21 05/21/2012 0926   AST 24 01/24/2012 1008   AST 17 08/21/2011 1306   ALT 14 05/21/2012 0926   ALT 8 08/21/2011 1306   BILITOT 0.53 05/21/2012 0926   BILITOT 0.90 01/24/2012 1008   BILITOT 0.6 08/21/2011 1306       RADIOGRAPHIC STUDIES: Ct Chest Wo Contrast  06/08/2012  *RADIOLOGY REPORT*  Clinical Data:  Restaging lung cancer  CT CHEST, ABDOMEN AND PELVIS WITHOUT CONTRAST  Technique:  Multidetector CT imaging of the chest, abdomen and pelvis was performed following the standard protocol without IV contrast.  Comparison:  01/24/2012   CT CHEST  Findings:  Lungs/pleura: No pleural effusion identified. Spiculated mass in the posterior segment right upper lobe measures 2 x 2.4 cm, image 24.  Previously 2.4 x 3.0 cm.  Index nodule in the right middle lobe measures 0.8 x 1.1 cm, image 39.  Previously 0.9 x 1.1 cm.  Subpleural ground-glass nodule within the anterior left upper lobe measures 8 mm, image 29.  Previously 7 mm.  Left upper lobe nodule measures 4 mm, image 18.  Previously this measured the same.  New or progressive pulmonary nodule or mass identified.  Heart/Mediastinum: The heart size appears normal.  No pericardial effusion.  Prominent calcifications involving the LAD coronary artery noted.  No mediastinal or hilar adenopathy identified.  Bones/Musculoskeletal:  No aggressive lytic or sclerotic bone lesions identified to suggest osseous metastases. .  IMPRESSION:  1.  Right upper lobe lung mass is slightly decreased in size from previous exam.  Other smaller nodules are stable from prior  study. 2.  No new or progressive disease identified.   CT ABDOMEN AND PELVIS  Findings:  Abdomen/pelvis:  No focal liver abnormality.  The gallbladder appears normal.  No biliary dilatation.  The pancreas is unremarkable.  The adrenal glands are both normal.  Normal appearance of both kidneys. Urinary bladder appears normal for degree of distention. Previous hysterectomy.    Cyst within the right adnexa measures 4.6 x 3.1 cm, image 93.  Previously 4.6 x 3.1 cm.  The stomach appears normal.  The small bowel loops are unremarkable.  Normal appearance of proximal colon.  There are multiple sigmoid diverticula without acute inflammation.  Bones/Musculoskeletal:  No abnormality identified.  IMPRESSION:  1.  Stable CT of the abdomen and pelvis. 2.  No evidence for  metastatic disease. 3.  No change and right adnexal low attenuation lesion.  Likely cyst.   Original Report Authenticated By: Signa Kell, M.D.     ASSESSMENT: This is a very pleasant 78 years old white female with recurrent non-small cell lung cancer currently on maintenance Alimta. The patient is tolerating her treatment fairly well with no significant evidence for disease progression.  PLAN: I discussed the scan results with the patient today. I recommended for her to continue with maintenance Alimta for now. She would come back for followup visit in 3 weeks with the next cycle of her treatment. The patient was advised to call me immediately if she has any concerning symptoms in the interval.  All questions were answered. The patient knows to call the clinic with any problems, questions or concerns. We can certainly see the patient much sooner if necessary.  I spent 15 minutes counseling the patient face to face. The total time spent in the appointment was 25 minutes.

## 2012-06-11 NOTE — Patient Instructions (Signed)
[facility] Discharge Instructions for Patients Receiving Chemotherapy  Today you received the following chemotherapy agents Alimta To help prevent nausea and vomiting after your treatment, we encourage you to take your nausea medication as prescribed.  If you develop nausea and vomiting that is not controlled by your nausea medication, call the clinic. If it is after clinic hours your family physician or the after hours number for the clinic or go to the Emergency Department.   BELOW ARE SYMPTOMS THAT SHOULD BE REPORTED IMMEDIATELY:  *FEVER GREATER THAN 100.5 F  *CHILLS WITH OR WITHOUT FEVER  NAUSEA AND VOMITING THAT IS NOT CONTROLLED WITH YOUR NAUSEA MEDICATION  *UNUSUAL SHORTNESS OF BREATH  *UNUSUAL BRUISING OR BLEEDING  TENDERNESS IN MOUTH AND THROAT WITH OR WITHOUT PRESENCE OF ULCERS  *URINARY PROBLEMS  *BOWEL PROBLEMS  UNUSUAL RASH Items with * indicate a potential emergency and should be followed up as soon as possible.  One of the nurses will contact you 24 hours after your treatment. Please let the nurse know about any problems that you may have experienced. Feel free to call the clinic you have any questions or concerns. The clinic phone number is [phone].   I have been informed and understand all the instructions given to me. I know to contact the clinic, my physician, or go to the Emergency Department if any problems should occur. I do not have any questions at this time, but understand that I may call the clinic during office hours   should I have any questions or need assistance in obtaining follow up care.    __________________________________________  _____________  __________ Signature of Patient or Authorized Representative            Date                   Time    __________________________________________ Nurse's Signature    

## 2012-06-11 NOTE — Telephone Encounter (Signed)
Gave pt appt for lab, ML and chemo for 1/30 and February 2014

## 2012-06-11 NOTE — Telephone Encounter (Signed)
Per staff message and POF I have scheduled appts.  JMW  

## 2012-06-14 ENCOUNTER — Encounter: Payer: Self-pay | Admitting: Internal Medicine

## 2012-06-14 NOTE — Patient Instructions (Signed)
No evidence for disease progression on his recent scan. Continue maintenance treatment with Alimta. Followup in 3 weeks

## 2012-07-02 ENCOUNTER — Other Ambulatory Visit (HOSPITAL_BASED_OUTPATIENT_CLINIC_OR_DEPARTMENT_OTHER): Payer: Medicare Other | Admitting: Lab

## 2012-07-02 ENCOUNTER — Ambulatory Visit (HOSPITAL_BASED_OUTPATIENT_CLINIC_OR_DEPARTMENT_OTHER): Payer: Medicare Other

## 2012-07-02 ENCOUNTER — Ambulatory Visit (HOSPITAL_BASED_OUTPATIENT_CLINIC_OR_DEPARTMENT_OTHER): Payer: Medicare Other | Admitting: Physician Assistant

## 2012-07-02 ENCOUNTER — Encounter: Payer: Self-pay | Admitting: Physician Assistant

## 2012-07-02 ENCOUNTER — Telehealth: Payer: Self-pay | Admitting: Internal Medicine

## 2012-07-02 VITALS — BP 135/77 | HR 72 | Temp 97.0°F | Resp 18 | Ht 62.0 in | Wt 152.1 lb

## 2012-07-02 DIAGNOSIS — C3490 Malignant neoplasm of unspecified part of unspecified bronchus or lung: Secondary | ICD-10-CM

## 2012-07-02 DIAGNOSIS — Z5111 Encounter for antineoplastic chemotherapy: Secondary | ICD-10-CM

## 2012-07-02 DIAGNOSIS — C3410 Malignant neoplasm of upper lobe, unspecified bronchus or lung: Secondary | ICD-10-CM

## 2012-07-02 DIAGNOSIS — C801 Malignant (primary) neoplasm, unspecified: Secondary | ICD-10-CM

## 2012-07-02 LAB — CBC WITH DIFFERENTIAL/PLATELET
Basophils Absolute: 0 10*3/uL (ref 0.0–0.1)
EOS%: 0.5 % (ref 0.0–7.0)
HCT: 39.1 % (ref 34.8–46.6)
HGB: 13.1 g/dL (ref 11.6–15.9)
LYMPH%: 40.4 % (ref 14.0–49.7)
MCH: 32.8 pg (ref 25.1–34.0)
MCV: 98 fL (ref 79.5–101.0)
MONO%: 11.4 % (ref 0.0–14.0)
NEUT%: 47.5 % (ref 38.4–76.8)
Platelets: 227 10*3/uL (ref 145–400)
lymph#: 1.6 10*3/uL (ref 0.9–3.3)

## 2012-07-02 LAB — COMPREHENSIVE METABOLIC PANEL (CC13)
AST: 22 U/L (ref 5–34)
BUN: 16.8 mg/dL (ref 7.0–26.0)
Calcium: 9 mg/dL (ref 8.4–10.4)
Chloride: 107 mEq/L (ref 98–107)
Creatinine: 0.8 mg/dL (ref 0.6–1.1)
Total Bilirubin: 0.66 mg/dL (ref 0.20–1.20)

## 2012-07-02 MED ORDER — HEPARIN SOD (PORK) LOCK FLUSH 100 UNIT/ML IV SOLN
500.0000 [IU] | Freq: Once | INTRAVENOUS | Status: AC | PRN
  Administered 2012-07-02: 500 [IU]
  Filled 2012-07-02: qty 5

## 2012-07-02 MED ORDER — SODIUM CHLORIDE 0.9 % IV SOLN
Freq: Once | INTRAVENOUS | Status: AC
  Administered 2012-07-02: 12:00:00 via INTRAVENOUS

## 2012-07-02 MED ORDER — DEXAMETHASONE SODIUM PHOSPHATE 10 MG/ML IJ SOLN
10.0000 mg | Freq: Once | INTRAMUSCULAR | Status: AC
  Administered 2012-07-02: 10 mg via INTRAVENOUS

## 2012-07-02 MED ORDER — SODIUM CHLORIDE 0.9 % IV SOLN
500.0000 mg/m2 | Freq: Once | INTRAVENOUS | Status: AC
  Administered 2012-07-02: 900 mg via INTRAVENOUS
  Filled 2012-07-02: qty 36

## 2012-07-02 MED ORDER — SODIUM CHLORIDE 0.9 % IJ SOLN
10.0000 mL | INTRAMUSCULAR | Status: DC | PRN
  Administered 2012-07-02: 10 mL
  Filled 2012-07-02: qty 10

## 2012-07-02 MED ORDER — ONDANSETRON 8 MG/50ML IVPB (CHCC)
8.0000 mg | Freq: Once | INTRAVENOUS | Status: AC
  Administered 2012-07-02: 8 mg via INTRAVENOUS

## 2012-07-02 NOTE — Patient Instructions (Addendum)
Follow up in 3 weeks prior to your next cycle of chemotherapy 

## 2012-07-02 NOTE — Progress Notes (Signed)
[facility] [phone]   [phone]  OFFICE PROGRESS NOTE  [name],[name], MD [address] [place] [address]  Principle Diagnosis: Recurrent non-small cell lung cancer, initially diagnosed a stage IB (T2, N0, M0) adenocarcinoma in June 2008 with a tumor size of 8 cm.   Prior Therapy:  #1 status post left upper lobectomy with lymph node dissection under the care of Dr. Edwyna Shell on 06/29/2006.  #2 status post 4 cycles of adjuvant chemotherapy with cisplatin and Taxotere last dose given 08/01/2006.  #3 status post 6 cycles of systemic chemotherapy with carboplatin and Alimta for disease recurrence last dose given 12/12/2009.  #4 Maintenance chemotherapy with Alimta at 500 mg per meter squared given every 3 weeks status post 23 cycles.   Current therapy: Patient has resumed systemic chemotherapy with Alimta at 500 mg per meter squared given every 3 weeks status post 6 cycles. Of note the patient is not on dexamethasone with the Alimta due to rash/flushing from the dexamethasone.  INTERVAL HISTORY: [name] 79 y.o. female returns to the clinic today for followup visit. The patient is feeling fine with no specific complaints. She is tolerating her maintenance chemotherapy with single agent Alimta fairly well. She does have about 3 days of generalized malaise that sets in approximately 2 days after chemotherapy but otherwise  is tolerating the chemotherapy with single agent Alimta relatively well. The patient denied having any significant chest pain, shortness breath, cough or hemoptysis. The patient denied having any significant weight loss or night sweats.   MEDICAL HISTORY: Past Medical History  Diagnosis Date  . lung ca dx'd 06/2006    rt and lt lung  . History of migraine headaches   . Hypercholesterolemia   . Lung cancer     ALLERGIES:  is allergic to ondansetron hcl; simvastatin; and iodinated diagnostic agents.  MEDICATIONS:    Current Outpatient Prescriptions  Medication Sig Dispense Refill  . Calcium Carbonate-Vit D-Min (CALTRATE PLUS PO) Take 500 mg by mouth 2 (two) times daily.        . folic acid (FOLVITE) 1 MG tablet Take 1 mg by mouth daily.        . influenza virus trivalent vaccine (FLUZONE) injection Inject 0.5 mLs into the muscle once.  0.5 mL  0  . Multiple Vitamins-Minerals (CENTRUM SILVER PO) Take 1 tablet by mouth daily.       . prochlorperazine (COMPAZINE) 10 MG tablet Take 10 mg by mouth every 6 (six) hours as needed.        . ranitidine (ZANTAC) 75 MG tablet Take 75 mg by mouth. Pt takes as needed      . vitamin B-12 (CYANOCOBALAMIN) 500 MCG tablet Take 500 mcg by mouth daily.       No current facility-administered medications for this visit.   Facility-Administered Medications Ordered in Other Visits  Medication Dose Route Frequency Provider Last Rate Last Dose  . sodium chloride 0.9 % injection 10 mL  10 mL Intracatheter PRN Si Gaul, MD   10 mL at 07/02/12 1238    REVIEW OF SYSTEMS:  A comprehensive review of systems was negative except for: Constitutional: positive for malaise   PHYSICAL EXAMINATION: General appearance: alert, cooperative and no distress Head: Normocephalic, without obvious abnormality, atraumatic Neck: no adenopathy Lymph nodes: Cervical, supraclavicular, and axillary nodes normal. Resp: clear to auscultation bilaterally Cardio: regular rate and rhythm, S1, S2 normal, no murmur, click, rub or gallop GI: soft, non-tender;  bowel sounds normal; no masses,  no organomegaly Extremities: extremities normal, atraumatic, no cyanosis or edema Neurologic: Alert and oriented X 3, normal strength and tone. Normal symmetric reflexes. Normal coordination and gait  ECOG PERFORMANCE STATUS: 0 - Asymptomatic  Blood pressure 135/77, pulse 72, temperature 97 F (36.1 C), temperature source Oral, resp. rate 18, height 5\' 2"  (1.575 m), weight 152 lb 1.6 oz (68.992  kg).  LABORATORY DATA: Lab Results  Component Value Date   WBC 4.0 07/02/2012   HGB 13.1 07/02/2012   HCT 39.1 07/02/2012   MCV 98.0 07/02/2012   PLT 227 07/02/2012      Chemistry      Component Value Date/Time   NA 139 07/02/2012 1009   NA 139 01/24/2012 1008   NA 140 08/21/2011 1306   K 4.1 07/02/2012 1009   K 4.2 01/24/2012 1008   K 4.1 08/21/2011 1306   CL 107 07/02/2012 1009   CL 103 01/24/2012 1008   CL 107 08/21/2011 1306   CO2 23 07/02/2012 1009   CO2 28 01/24/2012 1008   CO2 22 08/21/2011 1306   BUN 16.8 07/02/2012 1009   BUN 13 01/24/2012 1008   BUN 11 08/21/2011 1306   CREATININE 0.8 07/02/2012 1009   CREATININE 0.8 01/24/2012 1008   CREATININE 0.81 08/21/2011 1306      Component Value Date/Time   CALCIUM 9.0 07/02/2012 1009   CALCIUM 8.6 01/24/2012 1008   CALCIUM 8.8 08/21/2011 1306   ALKPHOS 83 07/02/2012 1009   ALKPHOS 79 01/24/2012 1008   ALKPHOS 87 08/21/2011 1306   AST 22 07/02/2012 1009   AST 24 01/24/2012 1008   AST 17 08/21/2011 1306   ALT 9 07/02/2012 1009   ALT 8 08/21/2011 1306   BILITOT 0.66 07/02/2012 1009   BILITOT 0.90 01/24/2012 1008   BILITOT 0.6 08/21/2011 1306       RADIOGRAPHIC STUDIES: Ct Chest Wo Contrast  06/08/2012  *RADIOLOGY REPORT*  Clinical Data:  Restaging lung cancer  CT CHEST, ABDOMEN AND PELVIS WITHOUT CONTRAST  Technique:  Multidetector CT imaging of the chest, abdomen and pelvis was performed following the standard protocol without IV contrast.  Comparison:  01/24/2012   CT CHEST  Findings:  Lungs/pleura: No pleural effusion identified. Spiculated mass in the posterior segment right upper lobe measures 2 x 2.4 cm, image 24.  Previously 2.4 x 3.0 cm.  Index nodule in the right middle lobe measures 0.8 x 1.1 cm, image 39.  Previously 0.9 x 1.1 cm.  Subpleural ground-glass nodule within the anterior left upper lobe measures 8 mm, image 29.  Previously 7 mm.  Left upper lobe nodule measures 4 mm, image 18.  Previously this measured the same.  New or  progressive pulmonary nodule or mass identified.  Heart/Mediastinum: The heart size appears normal.  No pericardial effusion.  Prominent calcifications involving the LAD coronary artery noted.  No mediastinal or hilar adenopathy identified.  Bones/Musculoskeletal:  No aggressive lytic or sclerotic bone lesions identified to suggest osseous metastases. .  IMPRESSION:  1.  Right upper lobe lung mass is slightly decreased in size from previous exam.  Other smaller nodules are stable from prior study. 2.  No new or progressive disease identified.   CT ABDOMEN AND PELVIS  Findings:  Abdomen/pelvis:  No focal liver abnormality.  The gallbladder appears normal.  No biliary dilatation.  The pancreas is unremarkable.  The adrenal glands are both normal.  Normal appearance of both kidneys. Urinary bladder appears normal for  degree of distention. Previous hysterectomy.    Cyst within the right adnexa measures 4.6 x 3.1 cm, image 93.  Previously 4.6 x 3.1 cm.  The stomach appears normal.  The small bowel loops are unremarkable.  Normal appearance of proximal colon.  There are multiple sigmoid diverticula without acute inflammation.  Bones/Musculoskeletal:  No abnormality identified.  IMPRESSION:  1.  Stable CT of the abdomen and pelvis. 2.  No evidence for metastatic disease. 3.  No change and right adnexal low attenuation lesion.  Likely cyst.   Original Report Authenticated By: Signa Kell, M.D.     ASSESSMENT/PLAN: This is a very pleasant 79 years old white female with recurrent non-small cell lung cancer currently on maintenance Alimta. The patient is tolerating her treatment fairly well with no significant evidence for disease progression. The patient was discussed with Dr. Arbutus Ped. She'll proceed with her scheduled chemotherapy with single agent Alimta at 500 mg read square given every 3 weeks. She'll return in 3 weeks with repeat CBC differential and C. met prior to her next scheduled cycle of  chemotherapy.  Laural Benes, [name] Naab E, [name]   All questions were answered. The patient knows to call the clinic with any problems, questions or concerns. We can certainly see the patient much sooner if necessary.  I spent 20 minutes counseling the patient face to face. The total time spent in the appointment was 30 minutes.

## 2012-07-02 NOTE — Patient Instructions (Addendum)
[facility] Discharge Instructions for Patients Receiving Chemotherapy  Today you received the following chemotherapy agents Alimta.  To help prevent nausea and vomiting after your treatment, we encourage you to take your nausea medication as directed.  If you develop nausea and vomiting that is not controlled by your nausea medication, call the clinic. If it is after clinic hours your family physician or the after hours number for the clinic or go to the Emergency Department.   BELOW ARE SYMPTOMS THAT SHOULD BE REPORTED IMMEDIATELY:  *FEVER GREATER THAN 100.5 F  *CHILLS WITH OR WITHOUT FEVER  NAUSEA AND VOMITING THAT IS NOT CONTROLLED WITH YOUR NAUSEA MEDICATION  *UNUSUAL SHORTNESS OF BREATH  *UNUSUAL BRUISING OR BLEEDING  TENDERNESS IN MOUTH AND THROAT WITH OR WITHOUT PRESENCE OF ULCERS  *URINARY PROBLEMS  *BOWEL PROBLEMS  UNUSUAL RASH Items with * indicate a potential emergency and should be followed up as soon as possible.  Feel free to call the clinic you have any questions or concerns. The clinic phone number is [phone].

## 2012-07-02 NOTE — Telephone Encounter (Signed)
gv and printed appt schedule for pt for Feb °

## 2012-07-23 ENCOUNTER — Other Ambulatory Visit: Payer: Medicare Other | Admitting: Lab

## 2012-07-23 ENCOUNTER — Telehealth: Payer: Self-pay | Admitting: Internal Medicine

## 2012-07-23 ENCOUNTER — Ambulatory Visit (HOSPITAL_BASED_OUTPATIENT_CLINIC_OR_DEPARTMENT_OTHER): Payer: Medicare Other | Admitting: Physician Assistant

## 2012-07-23 ENCOUNTER — Encounter: Payer: Self-pay | Admitting: Physician Assistant

## 2012-07-23 ENCOUNTER — Telehealth: Payer: Self-pay | Admitting: *Deleted

## 2012-07-23 ENCOUNTER — Ambulatory Visit (HOSPITAL_BASED_OUTPATIENT_CLINIC_OR_DEPARTMENT_OTHER): Payer: Medicare Other

## 2012-07-23 VITALS — BP 133/79 | HR 61 | Temp 96.7°F | Resp 20 | Ht 62.0 in | Wt 153.1 lb

## 2012-07-23 DIAGNOSIS — C801 Malignant (primary) neoplasm, unspecified: Secondary | ICD-10-CM

## 2012-07-23 LAB — COMPREHENSIVE METABOLIC PANEL (CC13)
BUN: 14.8 mg/dL (ref 7.0–26.0)
CO2: 23 mEq/L (ref 22–29)
Calcium: 9 mg/dL (ref 8.4–10.4)
Chloride: 107 mEq/L (ref 98–107)
Creatinine: 0.8 mg/dL (ref 0.6–1.1)

## 2012-07-23 LAB — CBC WITH DIFFERENTIAL/PLATELET
Basophils Absolute: 0 10*3/uL (ref 0.0–0.1)
HCT: 41.9 % (ref 34.8–46.6)
HGB: 13.8 g/dL (ref 11.6–15.9)
MCH: 33.3 pg (ref 25.1–34.0)
MONO#: 0.5 10*3/uL (ref 0.1–0.9)
NEUT%: 51.5 % (ref 38.4–76.8)
WBC: 4.2 10*3/uL (ref 3.9–10.3)
lymph#: 1.5 10*3/uL (ref 0.9–3.3)

## 2012-07-23 MED ORDER — HEPARIN SOD (PORK) LOCK FLUSH 100 UNIT/ML IV SOLN
500.0000 [IU] | Freq: Once | INTRAVENOUS | Status: AC | PRN
  Administered 2012-07-23: 500 [IU]
  Filled 2012-07-23: qty 5

## 2012-07-23 MED ORDER — SODIUM CHLORIDE 0.9 % IV SOLN
500.0000 mg/m2 | Freq: Once | INTRAVENOUS | Status: AC
  Administered 2012-07-23: 900 mg via INTRAVENOUS
  Filled 2012-07-23: qty 36

## 2012-07-23 MED ORDER — DEXAMETHASONE SODIUM PHOSPHATE 10 MG/ML IJ SOLN
10.0000 mg | Freq: Once | INTRAMUSCULAR | Status: AC
  Administered 2012-07-23: 10 mg via INTRAVENOUS

## 2012-07-23 MED ORDER — SODIUM CHLORIDE 0.9 % IJ SOLN
10.0000 mL | INTRAMUSCULAR | Status: DC | PRN
  Administered 2012-07-23: 10 mL
  Filled 2012-07-23: qty 10

## 2012-07-23 MED ORDER — SODIUM CHLORIDE 0.9 % IV SOLN
Freq: Once | INTRAVENOUS | Status: AC
  Administered 2012-07-23: 12:00:00 via INTRAVENOUS

## 2012-07-23 MED ORDER — ONDANSETRON 8 MG/50ML IVPB (CHCC)
8.0000 mg | Freq: Once | INTRAVENOUS | Status: AC
  Administered 2012-07-23: 8 mg via INTRAVENOUS

## 2012-07-23 NOTE — Telephone Encounter (Signed)
Per staff phone call and POF I have schedueld appts.  JMW  

## 2012-07-23 NOTE — Telephone Encounter (Signed)
gv and printed appt schedule for march....michelle add tx

## 2012-07-23 NOTE — Patient Instructions (Signed)
[facility] Discharge Instructions for Patients Receiving Chemotherapy  Today you received the following chemotherapy agents Alimta To help prevent nausea and vomiting after your treatment, we encourage you to take your nausea medication as prescribed.  If you develop nausea and vomiting that is not controlled by your nausea medication, call the clinic. If it is after clinic hours your family physician or the after hours number for the clinic or go to the Emergency Department.   BELOW ARE SYMPTOMS THAT SHOULD BE REPORTED IMMEDIATELY:  *FEVER GREATER THAN 100.5 F  *CHILLS WITH OR WITHOUT FEVER  NAUSEA AND VOMITING THAT IS NOT CONTROLLED WITH YOUR NAUSEA MEDICATION  *UNUSUAL SHORTNESS OF BREATH  *UNUSUAL BRUISING OR BLEEDING  TENDERNESS IN MOUTH AND THROAT WITH OR WITHOUT PRESENCE OF ULCERS  *URINARY PROBLEMS  *BOWEL PROBLEMS  UNUSUAL RASH Items with * indicate a potential emergency and should be followed up as soon as possible.  One of the nurses will contact you 24 hours after your treatment. Please let the nurse know about any problems that you may have experienced. Feel free to call the clinic you have any questions or concerns. The clinic phone number is [phone].   I have been informed and understand all the instructions given to me. I know to contact the clinic, my physician, or go to the Emergency Department if any problems should occur. I do not have any questions at this time, but understand that I may call the clinic during office hours   should I have any questions or need assistance in obtaining follow up care.    __________________________________________  _____________  __________ Signature of Patient or Authorized Representative            Date                   Time    __________________________________________ Nurse's Signature    

## 2012-07-23 NOTE — Patient Instructions (Addendum)
Follow up in 3 weeks prior to your next scheduled cycle of chemotherapy 

## 2012-07-26 NOTE — Progress Notes (Signed)
[facility] [phone]   Fax:([phone]  OFFICE PROGRESS NOTE  [name],[name], MD [address] [place] [address]  Principle Diagnosis: Recurrent non-small cell lung cancer, initially diagnosed a stage IB (T2, N0, M0) adenocarcinoma in June 2008 with a tumor size of 8 cm.   Prior Therapy:  #1 status post left upper lobectomy with lymph node dissection under the care of Dr. Edwyna Shell on 06/29/2006.  #2 status post 4 cycles of adjuvant chemotherapy with cisplatin and Taxotere last dose given 08/01/2006.  #3 status post 6 cycles of systemic chemotherapy with carboplatin and Alimta for disease recurrence last dose given 12/12/2009.  #4 Maintenance chemotherapy with Alimta at 500 mg per meter squared given every 3 weeks status post 23 cycles.   Current therapy: Patient has resumed systemic chemotherapy with Alimta at 500 mg per meter squared given every 3 weeks status post 7 cycles. Of note the patient is not on dexamethasone with the Alimta due to rash/flushing from the dexamethasone.  INTERVAL HISTORY: [name] 79 y.o. female returns to the clinic today for followup visit. The patient is feeling fine with no specific complaints. She is tolerating her maintenance chemotherapy with single agent Alimta fairly well. She does have about 3 days of generalized malaise that sets in approximately 2 days after chemotherapy but otherwise  is tolerating the chemotherapy with single agent Alimta relatively well. The patient denied having any significant chest pain, shortness breath, cough or hemoptysis. The patient denied having any significant weight loss or night sweats. She request to postpone her restaging CT scan by 3 weeks.  MEDICAL HISTORY: Past Medical History  Diagnosis Date  . lung ca dx'd 06/2006    rt and lt lung  . History of migraine headaches   . Hypercholesterolemia   . Lung cancer     ALLERGIES:  is allergic to ondansetron hcl;  simvastatin; and iodinated diagnostic agents.  MEDICATIONS:  Current Outpatient Prescriptions  Medication Sig Dispense Refill  . Calcium Carbonate-Vit D-Min (CALTRATE PLUS PO) Take 500 mg by mouth 2 (two) times daily.        . folic acid (FOLVITE) 1 MG tablet Take 1 mg by mouth daily.        . influenza virus trivalent vaccine (FLUZONE) injection Inject 0.5 mLs into the muscle once.  0.5 mL  0  . Multiple Vitamins-Minerals (CENTRUM SILVER PO) Take 1 tablet by mouth daily.       . prochlorperazine (COMPAZINE) 10 MG tablet Take 10 mg by mouth every 6 (six) hours as needed.        . ranitidine (ZANTAC) 75 MG tablet Take 75 mg by mouth. Pt takes as needed      . vitamin B-12 (CYANOCOBALAMIN) 500 MCG tablet Take 500 mcg by mouth daily.       No current facility-administered medications for this visit.    REVIEW OF SYSTEMS:  A comprehensive review of systems was negative except for: Constitutional: positive for malaise   PHYSICAL EXAMINATION: General appearance: alert, cooperative and no distress Head: Normocephalic, without obvious abnormality, atraumatic Neck: no adenopathy Lymph nodes: Cervical, supraclavicular, and axillary nodes normal. Resp: clear to auscultation bilaterally Cardio: regular rate and rhythm, S1, S2 normal, no murmur, click, rub or gallop GI: soft, non-tender; bowel sounds normal; no masses,  no organomegaly Extremities: extremities normal, atraumatic, no cyanosis or edema Neurologic: Alert and oriented X 3, normal strength and tone. Normal symmetric reflexes. Normal coordination and gait  ECOG PERFORMANCE STATUS: 0 - Asymptomatic  Blood pressure 133/79, pulse 61, temperature 96.7 F (35.9 C), temperature source Oral, resp. rate 20, height 5\' 2"  (1.575 m), weight 153 lb 1.6 oz (69.446 kg).  LABORATORY DATA: Lab Results  Component Value Date   WBC 4.2 07/23/2012   HGB 13.8 07/23/2012   HCT 41.9 07/23/2012   MCV 100.6 07/23/2012   PLT 223 07/23/2012      Chemistry       Component Value Date/Time   NA 139 07/23/2012 1000   NA 139 01/24/2012 1008   NA 140 08/21/2011 1306   K 3.5 07/23/2012 1000   K 4.2 01/24/2012 1008   K 4.1 08/21/2011 1306   CL 107 07/23/2012 1000   CL 103 01/24/2012 1008   CL 107 08/21/2011 1306   CO2 23 07/23/2012 1000   CO2 28 01/24/2012 1008   CO2 22 08/21/2011 1306   BUN 14.8 07/23/2012 1000   BUN 13 01/24/2012 1008   BUN 11 08/21/2011 1306   CREATININE 0.8 07/23/2012 1000   CREATININE 0.8 01/24/2012 1008   CREATININE 0.81 08/21/2011 1306      Component Value Date/Time   CALCIUM 9.0 07/23/2012 1000   CALCIUM 8.6 01/24/2012 1008   CALCIUM 8.8 08/21/2011 1306   ALKPHOS 79 07/23/2012 1000   ALKPHOS 79 01/24/2012 1008   ALKPHOS 87 08/21/2011 1306   AST 20 07/23/2012 1000   AST 24 01/24/2012 1008   AST 17 08/21/2011 1306   ALT 13 07/23/2012 1000   ALT 8 08/21/2011 1306   BILITOT 0.68 07/23/2012 1000   BILITOT 0.90 01/24/2012 1008   BILITOT 0.6 08/21/2011 1306       RADIOGRAPHIC STUDIES: Ct Chest Wo Contrast  06/08/2012  *RADIOLOGY REPORT*  Clinical Data:  Restaging lung cancer  CT CHEST, ABDOMEN AND PELVIS WITHOUT CONTRAST  Technique:  Multidetector CT imaging of the chest, abdomen and pelvis was performed following the standard protocol without IV contrast.  Comparison:  01/24/2012   CT CHEST  Findings:  Lungs/pleura: No pleural effusion identified. Spiculated mass in the posterior segment right upper lobe measures 2 x 2.4 cm, image 24.  Previously 2.4 x 3.0 cm.  Index nodule in the right middle lobe measures 0.8 x 1.1 cm, image 39.  Previously 0.9 x 1.1 cm.  Subpleural ground-glass nodule within the anterior left upper lobe measures 8 mm, image 29.  Previously 7 mm.  Left upper lobe nodule measures 4 mm, image 18.  Previously this measured the same.  New or progressive pulmonary nodule or mass identified.  Heart/Mediastinum: The heart size appears normal.  No pericardial effusion.  Prominent calcifications involving the LAD coronary artery noted.   No mediastinal or hilar adenopathy identified.  Bones/Musculoskeletal:  No aggressive lytic or sclerotic bone lesions identified to suggest osseous metastases. .  IMPRESSION:  1.  Right upper lobe lung mass is slightly decreased in size from previous exam.  Other smaller nodules are stable from prior study. 2.  No new or progressive disease identified.   CT ABDOMEN AND PELVIS  Findings:  Abdomen/pelvis:  No focal liver abnormality.  The gallbladder appears normal.  No biliary dilatation.  The pancreas is unremarkable.  The adrenal glands are both normal.  Normal appearance of both kidneys. Urinary bladder appears normal for degree of distention. Previous hysterectomy.    Cyst within the right adnexa measures 4.6 x 3.1 cm, image 93.  Previously 4.6 x 3.1 cm.  The stomach appears normal.  The small  bowel loops are unremarkable.  Normal appearance of proximal colon.  There are multiple sigmoid diverticula without acute inflammation.  Bones/Musculoskeletal:  No abnormality identified.  IMPRESSION:  1.  Stable CT of the abdomen and pelvis. 2.  No evidence for metastatic disease. 3.  No change and right adnexal low attenuation lesion.  Likely cyst.   Original Report Authenticated By: Signa Kell, M.D.     ASSESSMENT/PLAN: This is a very pleasant 79 years old white female with recurrent non-small cell lung cancer currently on maintenance Alimta. The patient is tolerating her treatment fairly well with no significant evidence for disease progression. The patient was discussed with Dr. Arbutus Ped. She'll proceed with her scheduled chemotherapy with single agent Alimta at 500 mg read square given every 3 weeks. She'll return in 3 weeks with repeat CBC differential and C. met prior to her next scheduled cycle of chemotherapy. We will schedule her restaging CT scan when she returns in 3 weeks with her next scheduled cycle of chemotherapy with single agent Alimta.  Laural Benes, Yasmeen Manka E, [name]   All questions were answered.  The patient knows to call the clinic with any problems, questions or concerns. We can certainly see the patient much sooner if necessary.  I spent 20 minutes counseling the patient face to face. The total time spent in the appointment was 30 minutes.

## 2012-07-30 ENCOUNTER — Other Ambulatory Visit: Payer: Medicare Other | Admitting: Lab

## 2012-08-06 ENCOUNTER — Other Ambulatory Visit: Payer: Medicare Other | Admitting: Lab

## 2012-08-13 ENCOUNTER — Ambulatory Visit (HOSPITAL_BASED_OUTPATIENT_CLINIC_OR_DEPARTMENT_OTHER): Payer: Medicare Other | Admitting: Physician Assistant

## 2012-08-13 ENCOUNTER — Encounter: Payer: Self-pay | Admitting: Internal Medicine

## 2012-08-13 ENCOUNTER — Telehealth: Payer: Self-pay | Admitting: Internal Medicine

## 2012-08-13 ENCOUNTER — Ambulatory Visit: Payer: Medicare Other | Admitting: Physician Assistant

## 2012-08-13 ENCOUNTER — Other Ambulatory Visit (HOSPITAL_BASED_OUTPATIENT_CLINIC_OR_DEPARTMENT_OTHER): Payer: Medicare Other | Admitting: Lab

## 2012-08-13 ENCOUNTER — Other Ambulatory Visit: Payer: Medicare Other | Admitting: Lab

## 2012-08-13 ENCOUNTER — Ambulatory Visit (HOSPITAL_BASED_OUTPATIENT_CLINIC_OR_DEPARTMENT_OTHER): Payer: Medicare Other

## 2012-08-13 LAB — CBC WITH DIFFERENTIAL/PLATELET
BASO%: 0.6 % (ref 0.0–2.0)
Basophils Absolute: 0 10*3/uL (ref 0.0–0.1)
HCT: 41.3 % (ref 34.8–46.6)
HGB: 13.6 g/dL (ref 11.6–15.9)
MONO#: 0.4 10*3/uL (ref 0.1–0.9)
NEUT%: 48.3 % (ref 38.4–76.8)
WBC: 3.4 10*3/uL — ABNORMAL LOW (ref 3.9–10.3)
lymph#: 1.3 10*3/uL (ref 0.9–3.3)

## 2012-08-13 LAB — COMPREHENSIVE METABOLIC PANEL (CC13)
AST: 22 U/L (ref 5–34)
Albumin: 3.2 g/dL — ABNORMAL LOW (ref 3.5–5.0)
BUN: 9.7 mg/dL (ref 7.0–26.0)
Calcium: 8.5 mg/dL (ref 8.4–10.4)
Chloride: 109 mEq/L — ABNORMAL HIGH (ref 98–107)
Glucose: 109 mg/dl — ABNORMAL HIGH (ref 70–99)
Potassium: 3.4 mEq/L — ABNORMAL LOW (ref 3.5–5.1)
Total Protein: 6.4 g/dL (ref 6.4–8.3)

## 2012-08-13 MED ORDER — CYANOCOBALAMIN 1000 MCG/ML IJ SOLN
1000.0000 ug | Freq: Once | INTRAMUSCULAR | Status: AC
  Administered 2012-08-13: 1000 ug via INTRAMUSCULAR

## 2012-08-13 MED ORDER — SODIUM CHLORIDE 0.9 % IJ SOLN
10.0000 mL | INTRAMUSCULAR | Status: DC | PRN
  Administered 2012-08-13: 10 mL
  Filled 2012-08-13: qty 10

## 2012-08-13 MED ORDER — SODIUM CHLORIDE 0.9 % IV SOLN
Freq: Once | INTRAVENOUS | Status: AC
  Administered 2012-08-13: 12:00:00 via INTRAVENOUS

## 2012-08-13 MED ORDER — SODIUM CHLORIDE 0.9 % IV SOLN
500.0000 mg/m2 | Freq: Once | INTRAVENOUS | Status: AC
  Administered 2012-08-13: 900 mg via INTRAVENOUS
  Filled 2012-08-13: qty 36

## 2012-08-13 MED ORDER — HEPARIN SOD (PORK) LOCK FLUSH 100 UNIT/ML IV SOLN
500.0000 [IU] | Freq: Once | INTRAVENOUS | Status: AC | PRN
  Administered 2012-08-13: 500 [IU]
  Filled 2012-08-13: qty 5

## 2012-08-13 MED ORDER — DEXAMETHASONE SODIUM PHOSPHATE 10 MG/ML IJ SOLN
10.0000 mg | Freq: Once | INTRAMUSCULAR | Status: AC
  Administered 2012-08-13: 10 mg via INTRAVENOUS

## 2012-08-13 MED ORDER — ONDANSETRON 8 MG/50ML IVPB (CHCC)
8.0000 mg | Freq: Once | INTRAVENOUS | Status: AC
  Administered 2012-08-13: 8 mg via INTRAVENOUS

## 2012-08-13 NOTE — Telephone Encounter (Signed)
gv and printed appt schedule for pt for April....pt prefers water based...printed tx for Melissa....pt aware

## 2012-08-13 NOTE — Patient Instructions (Addendum)
Follow up with Dr. Mohamed in 3 weeks with a restaging CT scan of your chest, abdomen and pelvis to re-evaluate your disease 

## 2012-08-13 NOTE — Patient Instructions (Signed)
[facility] Discharge Instructions for Patients Receiving Chemotherapy  Today you received the following chemotherapy agents:  Alimta  To help prevent nausea and vomiting after your treatment, we encourage you to take your nausea medication as ordered per MD.    If you develop nausea and vomiting that is not controlled by your nausea medication, call the clinic. If it is after clinic hours your family physician or the after hours number for the clinic or go to the Emergency Department.   BELOW ARE SYMPTOMS THAT SHOULD BE REPORTED IMMEDIATELY:  *FEVER GREATER THAN 100.5 F  *CHILLS WITH OR WITHOUT FEVER  NAUSEA AND VOMITING THAT IS NOT CONTROLLED WITH YOUR NAUSEA MEDICATION  *UNUSUAL SHORTNESS OF BREATH  *UNUSUAL BRUISING OR BLEEDING  TENDERNESS IN MOUTH AND THROAT WITH OR WITHOUT PRESENCE OF ULCERS  *URINARY PROBLEMS  *BOWEL PROBLEMS  UNUSUAL RASH Items with * indicate a potential emergency and should be followed up as soon as possible.   Please let the nurse know about any problems that you may have experienced. Feel free to call the clinic you have any questions or concerns. The clinic phone number is [phone].   I have been informed and understand all the instructions given to me. I know to contact the clinic, my physician, or go to the Emergency Department if any problems should occur. I do not have any questions at this time, but understand that I may call the clinic during office hours   should I have any questions or need assistance in obtaining follow up care.    __________________________________________  _____________  __________ Signature of Patient or Authorized Representative            Date                   Time    __________________________________________ Nurse's Signature    

## 2012-08-16 NOTE — Progress Notes (Signed)
[facility] Telephone:([phone]) [phone]   Fax:([phone]  OFFICE PROGRESS NOTE  [name],[name], MD [address] [place] [address]  Principle Diagnosis: Recurrent non-small cell lung cancer, initially diagnosed a stage IB (T2, N0, M0) adenocarcinoma in June 2008 with a tumor size of 8 cm.   Prior Therapy:  #1 status post left upper lobectomy with lymph node dissection under the care of Dr. Edwyna Shell on 06/29/2006.  #2 status post 4 cycles of adjuvant chemotherapy with cisplatin and Taxotere last dose given 08/01/2006.  #3 status post 6 cycles of systemic chemotherapy with carboplatin and Alimta for disease recurrence last dose given 12/12/2009.  #4 Maintenance chemotherapy with Alimta at 500 mg per meter squared given every 3 weeks status post 23 cycles.   Current therapy: Patient has resumed systemic chemotherapy with Alimta at 500 mg per meter squared given every 3 weeks status post 8 cycles. Of note the patient is not on dexamethasone with the Alimta due to rash/flushing from the dexamethasone.  INTERVAL HISTORY: [name] 79 y.o. female returns to the clinic today for followup visit. The patient is feeling fine with no specific complaints. She is tolerating her maintenance chemotherapy with single agent Alimta fairly well. She does have about 3 days of generalized malaise that sets in approximately 2 days after chemotherapy but otherwise  is tolerating the chemotherapy with single agent Alimta relatively well. The patient denied having any significant chest pain, shortness breath, cough or hemoptysis. The patient denied having any significant weight loss or night sweats.   MEDICAL HISTORY: Past Medical History  Diagnosis Date  . lung ca dx'd 06/2006    rt and lt lung  . History of migraine headaches   . Hypercholesterolemia   . Lung cancer     ALLERGIES:  is allergic to ondansetron hcl; simvastatin; and iodinated diagnostic agents.  MEDICATIONS:   Current Outpatient Prescriptions  Medication Sig Dispense Refill  . Calcium Carbonate-Vit D-Min (CALTRATE PLUS PO) Take 500 mg by mouth 2 (two) times daily.        . folic acid (FOLVITE) 1 MG tablet Take 1 mg by mouth daily.        . influenza virus trivalent vaccine (FLUZONE) injection Inject 0.5 mLs into the muscle once.  0.5 mL  0  . Multiple Vitamins-Minerals (CENTRUM SILVER PO) Take 1 tablet by mouth daily.       . prochlorperazine (COMPAZINE) 10 MG tablet Take 10 mg by mouth every 6 (six) hours as needed.        . ranitidine (ZANTAC) 75 MG tablet Take 75 mg by mouth. Pt takes as needed      . vitamin B-12 (CYANOCOBALAMIN) 500 MCG tablet Take 500 mcg by mouth daily.       No current facility-administered medications for this visit.    REVIEW OF SYSTEMS:  A comprehensive review of systems was negative except for: Constitutional: positive for malaise   PHYSICAL EXAMINATION: General appearance: alert, cooperative and no distress Head: Normocephalic, without obvious abnormality, atraumatic Neck: no adenopathy Lymph nodes: Cervical, supraclavicular, and axillary nodes normal. Resp: clear to auscultation bilaterally Cardio: regular rate and rhythm, S1, S2 normal, no murmur, click, rub or gallop GI: soft, non-tender; bowel sounds normal; no masses,  no organomegaly Extremities: extremities normal, atraumatic, no cyanosis or edema Neurologic: Alert and oriented X 3, normal strength and tone. Normal symmetric reflexes. Normal coordination and gait  ECOG PERFORMANCE STATUS: 0 - Asymptomatic  Blood pressure  145/79, pulse 67, temperature 96.8 F (36 C), temperature source Oral, resp. rate 20, height 5\' 2"  (1.575 m), weight 152 lb 9.6 oz (69.219 kg).  LABORATORY DATA: Lab Results  Component Value Date   WBC 3.4* 08/13/2012   HGB 13.6 08/13/2012   HCT 41.3 08/13/2012   MCV 99.5 08/13/2012   PLT 254 08/13/2012      Chemistry      Component Value Date/Time   NA 141 08/13/2012 1015   NA  139 01/24/2012 1008   NA 140 08/21/2011 1306   K 3.4* 08/13/2012 1015   K 4.2 01/24/2012 1008   K 4.1 08/21/2011 1306   CL 109* 08/13/2012 1015   CL 103 01/24/2012 1008   CL 107 08/21/2011 1306   CO2 24 08/13/2012 1015   CO2 28 01/24/2012 1008   CO2 22 08/21/2011 1306   BUN 9.7 08/13/2012 1015   BUN 13 01/24/2012 1008   BUN 11 08/21/2011 1306   CREATININE 0.7 08/13/2012 1015   CREATININE 0.8 01/24/2012 1008   CREATININE 0.81 08/21/2011 1306      Component Value Date/Time   CALCIUM 8.5 08/13/2012 1015   CALCIUM 8.6 01/24/2012 1008   CALCIUM 8.8 08/21/2011 1306   ALKPHOS 80 08/13/2012 1015   ALKPHOS 79 01/24/2012 1008   ALKPHOS 87 08/21/2011 1306   AST 22 08/13/2012 1015   AST 24 01/24/2012 1008   AST 17 08/21/2011 1306   ALT 13 08/13/2012 1015   ALT 8 08/21/2011 1306   BILITOT 0.58 08/13/2012 1015   BILITOT 0.90 01/24/2012 1008   BILITOT 0.6 08/21/2011 1306       RADIOGRAPHIC STUDIES: Ct Chest Wo Contrast  06/08/2012  *RADIOLOGY REPORT*  Clinical Data:  Restaging lung cancer  CT CHEST, ABDOMEN AND PELVIS WITHOUT CONTRAST  Technique:  Multidetector CT imaging of the chest, abdomen and pelvis was performed following the standard protocol without IV contrast.  Comparison:  01/24/2012   CT CHEST  Findings:  Lungs/pleura: No pleural effusion identified. Spiculated mass in the posterior segment right upper lobe measures 2 x 2.4 cm, image 24.  Previously 2.4 x 3.0 cm.  Index nodule in the right middle lobe measures 0.8 x 1.1 cm, image 39.  Previously 0.9 x 1.1 cm.  Subpleural ground-glass nodule within the anterior left upper lobe measures 8 mm, image 29.  Previously 7 mm.  Left upper lobe nodule measures 4 mm, image 18.  Previously this measured the same.  New or progressive pulmonary nodule or mass identified.  Heart/Mediastinum: The heart size appears normal.  No pericardial effusion.  Prominent calcifications involving the LAD coronary artery noted.  No mediastinal or hilar adenopathy identified.   Bones/Musculoskeletal:  No aggressive lytic or sclerotic bone lesions identified to suggest osseous metastases. .  IMPRESSION:  1.  Right upper lobe lung mass is slightly decreased in size from previous exam.  Other smaller nodules are stable from prior study. 2.  No new or progressive disease identified.   CT ABDOMEN AND PELVIS  Findings:  Abdomen/pelvis:  No focal liver abnormality.  The gallbladder appears normal.  No biliary dilatation.  The pancreas is unremarkable.  The adrenal glands are both normal.  Normal appearance of both kidneys. Urinary bladder appears normal for degree of distention. Previous hysterectomy.    Cyst within the right adnexa measures 4.6 x 3.1 cm, image 93.  Previously 4.6 x 3.1 cm.  The stomach appears normal.  The small bowel loops are unremarkable.  Normal appearance of proximal  colon.  There are multiple sigmoid diverticula without acute inflammation.  Bones/Musculoskeletal:  No abnormality identified.  IMPRESSION:  1.  Stable CT of the abdomen and pelvis. 2.  No evidence for metastatic disease. 3.  No change and right adnexal low attenuation lesion.  Likely cyst.   Original Report Authenticated By: Signa Kell, M.D.     ASSESSMENT/PLAN: This is a very pleasant 79 years old white female with recurrent non-small cell lung cancer currently on maintenance Alimta. The patient is tolerating her treatment fairly well with no significant evidence for disease progression. The patient was discussed with Dr. Arbutus Ped. She'll proceed with her scheduled chemotherapy with single agent Alimta at 500 mg read square given every 3 weeks. The patient initially monitor further postpone her restaging CT scan however the importance of keeping checked on the progress of her disease was explained to the patient by both myself and Dr. Arbutus Ped. Patient voiced understanding. She'll followup with Dr. Arbutus Ped in 3 weeks with repeat CBC differential, C. met and CT of the chest, abdomen and pelvis without  contrast to reevaluate her disease prior to her next scheduled cycle of chemotherapy.   Laural Benes, Freeman Borba E, [name]   All questions were answered. The patient knows to call the clinic with any problems, questions or concerns. We can certainly see the patient much sooner if necessary.  I spent 20 minutes counseling the patient face to face. The total time spent in the appointment was 30 minutes.

## 2012-08-20 ENCOUNTER — Telehealth: Payer: Self-pay | Admitting: *Deleted

## 2012-08-20 NOTE — Telephone Encounter (Signed)
Spoke with pt, she verbalized understanding regarding increasing K rich foods.  SLJ

## 2012-08-20 NOTE — Telephone Encounter (Signed)
Message copied by Caren Griffins on Thu Aug 20, 2012  8:46 AM ------      Message from: Conni Slipper      Created: Wed Aug 19, 2012  4:24 PM       Abnormal results, please  and notify patient to increase dietary potassium ------

## 2012-08-31 ENCOUNTER — Ambulatory Visit (HOSPITAL_COMMUNITY)
Admission: RE | Admit: 2012-08-31 | Discharge: 2012-08-31 | Disposition: A | Discharge status: Home / Self Care | Payer: Medicare Other | Source: Ambulatory Visit | Attending: Physician Assistant | Admitting: Physician Assistant

## 2012-08-31 ENCOUNTER — Encounter (HOSPITAL_COMMUNITY): Payer: Self-pay

## 2012-08-31 DIAGNOSIS — C3490 Malignant neoplasm of unspecified part of unspecified bronchus or lung: Secondary | ICD-10-CM | POA: Insufficient documentation

## 2012-08-31 DIAGNOSIS — I2510 Atherosclerotic heart disease of native coronary artery without angina pectoris: Secondary | ICD-10-CM | POA: Insufficient documentation

## 2012-08-31 DIAGNOSIS — N9489 Other specified conditions associated with female genital organs and menstrual cycle: Secondary | ICD-10-CM | POA: Insufficient documentation

## 2012-08-31 DIAGNOSIS — Z9221 Personal history of antineoplastic chemotherapy: Secondary | ICD-10-CM | POA: Insufficient documentation

## 2012-08-31 DIAGNOSIS — K5730 Diverticulosis of large intestine without perforation or abscess without bleeding: Secondary | ICD-10-CM | POA: Insufficient documentation

## 2012-08-31 DIAGNOSIS — R918 Other nonspecific abnormal finding of lung field: Secondary | ICD-10-CM | POA: Insufficient documentation

## 2012-08-31 DIAGNOSIS — Z90710 Acquired absence of both cervix and uterus: Secondary | ICD-10-CM | POA: Insufficient documentation

## 2012-08-31 DIAGNOSIS — Z902 Acquired absence of lung [part of]: Secondary | ICD-10-CM | POA: Insufficient documentation

## 2012-08-31 MED ORDER — IOHEXOL 300 MG/ML  SOLN: 50.0000 mL | Freq: Once | INTRAMUSCULAR | Status: DC | PRN

## 2012-08-31 MED ORDER — IOHEXOL 300 MG/ML  SOLN
50.0000 mL | Freq: Once | INTRAMUSCULAR | Status: AC | PRN
  Administered 2012-08-31: 50 mL via ORAL

## 2012-09-03 ENCOUNTER — Ambulatory Visit (HOSPITAL_BASED_OUTPATIENT_CLINIC_OR_DEPARTMENT_OTHER): Payer: Medicare Other

## 2012-09-03 ENCOUNTER — Other Ambulatory Visit (HOSPITAL_BASED_OUTPATIENT_CLINIC_OR_DEPARTMENT_OTHER): Payer: Medicare Other | Admitting: Lab

## 2012-09-03 ENCOUNTER — Ambulatory Visit (HOSPITAL_BASED_OUTPATIENT_CLINIC_OR_DEPARTMENT_OTHER): Payer: Medicare Other | Admitting: Internal Medicine

## 2012-09-03 ENCOUNTER — Telehealth: Payer: Self-pay | Admitting: Internal Medicine

## 2012-09-03 ENCOUNTER — Encounter: Payer: Self-pay | Admitting: Internal Medicine

## 2012-09-03 DIAGNOSIS — C7800 Secondary malignant neoplasm of unspecified lung: Secondary | ICD-10-CM

## 2012-09-03 DIAGNOSIS — C3410 Malignant neoplasm of upper lobe, unspecified bronchus or lung: Secondary | ICD-10-CM

## 2012-09-03 DIAGNOSIS — C801 Malignant (primary) neoplasm, unspecified: Secondary | ICD-10-CM

## 2012-09-03 DIAGNOSIS — Z5111 Encounter for antineoplastic chemotherapy: Secondary | ICD-10-CM

## 2012-09-03 DIAGNOSIS — C3490 Malignant neoplasm of unspecified part of unspecified bronchus or lung: Secondary | ICD-10-CM

## 2012-09-03 LAB — COMPREHENSIVE METABOLIC PANEL (CC13)
ALT: 11 U/L (ref 0–55)
AST: 23 U/L (ref 5–34)
Albumin: 3.5 g/dL (ref 3.5–5.0)
CO2: 23 mEq/L (ref 22–29)
Calcium: 9 mg/dL (ref 8.4–10.4)
Chloride: 108 mEq/L — ABNORMAL HIGH (ref 98–107)
Potassium: 4.2 mEq/L (ref 3.5–5.1)
Sodium: 140 mEq/L (ref 136–145)
Total Protein: 6.7 g/dL (ref 6.4–8.3)

## 2012-09-03 LAB — CBC WITH DIFFERENTIAL/PLATELET
Basophils Absolute: 0 10*3/uL (ref 0.0–0.1)
EOS%: 0.8 % (ref 0.0–7.0)
Eosinophils Absolute: 0 10*3/uL (ref 0.0–0.5)
HGB: 12.9 g/dL (ref 11.6–15.9)
LYMPH%: 39.3 % (ref 14.0–49.7)
MCH: 32.6 pg (ref 25.1–34.0)
MCV: 98.5 fL (ref 79.5–101.0)
MONO%: 14 % (ref 0.0–14.0)
NEUT#: 1.7 10*3/uL (ref 1.5–6.5)
Platelets: 254 10*3/uL (ref 145–400)

## 2012-09-03 MED ORDER — ONDANSETRON 8 MG/50ML IVPB (CHCC)
8.0000 mg | Freq: Once | INTRAVENOUS | Status: AC
  Administered 2012-09-03: 8 mg via INTRAVENOUS

## 2012-09-03 MED ORDER — SODIUM CHLORIDE 0.9 % IV SOLN: Freq: Once | INTRAVENOUS | Status: DC

## 2012-09-03 MED ORDER — DEXAMETHASONE SODIUM PHOSPHATE 10 MG/ML IJ SOLN
10.0000 mg | Freq: Once | INTRAMUSCULAR | Status: AC
  Administered 2012-09-03: 10 mg via INTRAVENOUS

## 2012-09-03 MED ORDER — SODIUM CHLORIDE 0.9 % IV SOLN
500.0000 mg/m2 | Freq: Once | INTRAVENOUS | Status: AC
  Administered 2012-09-03: 900 mg via INTRAVENOUS
  Filled 2012-09-03: qty 36

## 2012-09-03 MED ORDER — HEPARIN SOD (PORK) LOCK FLUSH 100 UNIT/ML IV SOLN
500.0000 [IU] | Freq: Once | INTRAVENOUS | Status: DC | PRN
  Filled 2012-09-03: qty 5

## 2012-09-03 MED ORDER — SODIUM CHLORIDE 0.9 % IJ SOLN
10.0000 mL | INTRAMUSCULAR | Status: DC | PRN
  Filled 2012-09-03: qty 10

## 2012-09-03 NOTE — Patient Instructions (Addendum)
Pemetrexed injection What is this medicine? PEMETREXED (PEM e TREX ed) is a chemotherapy drug. This medicine affects cells that are rapidly growing, such as cancer cells and cells in your mouth and stomach. It is usually used to treat lung cancers like non-small cell lung cancer and mesothelioma. It may also be used to treat other cancers. This medicine may be used for other purposes; ask your health care provider or pharmacist if you have questions. What should I tell my health care provider before I take this medicine? They need to know if you have any of these conditions: -if you frequently drink alcohol containing beverages -infection (especially a virus infection such as chickenpox, cold sores, or herpes) -kidney disease -liver disease -low blood counts, like low platelets, red bloods, or white blood cells -an unusual or allergic reaction to pemetrexed, mannitol, other medicines, foods, dyes, or preservatives -pregnant or trying to get pregnant -breast-feeding How should I use this medicine? This drug is given as an infusion into a vein. It is administered in a hospital or clinic by a specially trained health care professional. Talk to your pediatrician regarding the use of this medicine in children. Special care may be needed. Overdosage: If you think you have taken too much of this medicine contact a poison control center or emergency room at once. NOTE: This medicine is only for you. Do not share this medicine with others. What if I miss a dose? It is important not to miss your dose. Call your doctor or health care professional if you are unable to keep an appointment. What may interact with this medicine? -aspirin and aspirin-like medicines -medicines to increase blood counts like filgrastim, pegfilgrastim, sargramostim -methotrexate -NSAIDS, medicines for pain and inflammation, like ibuprofen or naproxen -probenecid -pyrimethamine -vaccines Talk to your doctor or health care  professional before taking any of these medicines: -acetaminophen -aspirin -ibuprofen -ketoprofen -naproxen This list may not describe all possible interactions. Give your health care provider a list of all the medicines, herbs, non-prescription drugs, or dietary supplements you use. Also tell them if you smoke, drink alcohol, or use illegal drugs. Some items may interact with your medicine. What should I watch for while using this medicine? Visit your doctor for checks on your progress. This drug may make you feel generally unwell. This is not uncommon, as chemotherapy can affect healthy cells as well as cancer cells. Report any side effects. Continue your course of treatment even though you feel ill unless your doctor tells you to stop. In some cases, you may be given additional medicines to help with side effects. Follow all directions for their use. Call your doctor or health care professional for advice if you get a fever, chills or sore throat, or other symptoms of a cold or flu. Do not treat yourself. This drug decreases your body's ability to fight infections. Try to avoid being around people who are sick. This medicine may increase your risk to bruise or bleed. Call your doctor or health care professional if you notice any unusual bleeding. Be careful brushing and flossing your teeth or using a toothpick because you may get an infection or bleed more easily. If you have any dental work done, tell your dentist you are receiving this medicine. Avoid taking products that contain aspirin, acetaminophen, ibuprofen, naproxen, or ketoprofen unless instructed by your doctor. These medicines may hide a fever. Call your doctor or health care professional if you get diarrhea or mouth sores. Do not treat yourself. To protect your  kidneys, drink water or other fluids as directed while you are taking this medicine. Men and women must use effective birth control while taking this medicine. You may also  need to continue using effective birth control for a time after stopping this medicine. Do not become pregnant while taking this medicine. Tell your doctor right away if you think that you or your partner might be pregnant. There is a potential for serious side effects to an unborn child. Talk to your health care professional or pharmacist for more information. Do not breast-feed an infant while taking this medicine. This medicine may lower sperm counts. What side effects may I notice from receiving this medicine? Side effects that you should report to your doctor or health care professional as soon as possible: -allergic reactions like skin rash, itching or hives, swelling of the face, lips, or tongue -low blood counts - this medicine may decrease the number of white blood cells, red blood cells and platelets. You may be at increased risk for infections and bleeding. -signs of infection - fever or chills, cough, sore throat, pain or difficulty passing urine -signs of decreased platelets or bleeding - bruising, pinpoint red spots on the skin, black, tarry stools, blood in the urine -signs of decreased red blood cells - unusually weak or tired, fainting spells, lightheadedness -breathing problems, like a dry cough -changes in emotions or moods -chest pain -confusion -diarrhea -high blood pressure -mouth or throat sores or ulcers -pain, swelling, warmth in the leg -pain on swallowing -swelling of the ankles, feet, hands -trouble passing urine or change in the amount of urine -vomiting -yellowing of the eyes or skin Side effects that usually do not require medical attention (report to your doctor or health care professional if they continue or are bothersome): -hair loss -loss of appetite -nausea -stomach upset This list may not describe all possible side effects. Call your doctor for medical advice about side effects. You may report side effects to FDA at 1-800-FDA-1088. Where should I keep  my medicine? This drug is given in a hospital or clinic and will not be stored at home. NOTE: This sheet is a summary. It may not cover all possible information. If you have questions about this medicine, talk to your doctor, pharmacist, or health care provider.  2013, [employer]. (12/22/2007 1:24:03 PM)

## 2012-09-03 NOTE — Telephone Encounter (Signed)
Gave pt appt for lab , Md and chemo for April and May 2014

## 2012-09-03 NOTE — Progress Notes (Signed)
[facility] Telephone:(336) [phone]   Fax:(336) [phone]  OFFICE PROGRESS NOTE  [name],[name], MD [address] [place] [address]  Principle Diagnosis: Recurrent non-small cell lung cancer, initially diagnosed a stage IB (T2, N0, M0) adenocarcinoma in June 2008 with a tumor size of 8 cm.   Prior Therapy:  #1 status post left upper lobectomy with lymph node dissection under the care of Dr. Edwyna Shell on 06/29/2006.  #2 status post 4 cycles of adjuvant chemotherapy with cisplatin and Taxotere last dose given 08/01/2006.  #3 status post 6 cycles of systemic chemotherapy with carboplatin and Alimta for disease recurrence last dose given 12/12/2009.  #4 Maintenance chemotherapy with Alimta at 500 mg per meter squared given every 3 weeks status post 23 cycles.   Current therapy: Patient has resumed systemic chemotherapy with Alimta at 500 mg per meter squared given every 3 weeks status post 9 cycles. Of note the patient is not on dexamethasone with the Alimta due to rash/flushing from the dexamethasone.    INTERVAL HISTORY: [name] 79 y.o. female returns to the clinic today for followup visit. The patient is feeling fine today with no specific complaints. She denied having any significant chest pain, shortness breath, cough or hemoptysis. She denied having any significant weight loss or night sweats. The patient is tolerating her maintenance chemotherapy with single agent Alimta fairly well with no significant adverse effects. She had repeat CT scan of the chest, abdomen and pelvis performed recently and she is here for evaluation and discussion of her scan results.  MEDICAL HISTORY: Past Medical History  Diagnosis Date  . lung ca dx'd 06/2006    rt and lt lung  . History of migraine headaches   . Hypercholesterolemia   . Lung cancer     ALLERGIES:  is allergic to ondansetron hcl; simvastatin; and iodinated diagnostic agents.  MEDICATIONS:  Current  Outpatient Prescriptions  Medication Sig Dispense Refill  . Calcium Carbonate-Vit D-Min (CALTRATE PLUS PO) Take 500 mg by mouth 2 (two) times daily.        . folic acid (FOLVITE) 1 MG tablet Take 1 mg by mouth daily.        . influenza virus trivalent vaccine (FLUZONE) injection Inject 0.5 mLs into the muscle once.  0.5 mL  0  . Multiple Vitamins-Minerals (CENTRUM SILVER PO) Take 1 tablet by mouth daily.       . prochlorperazine (COMPAZINE) 10 MG tablet Take 10 mg by mouth every 6 (six) hours as needed.        . ranitidine (ZANTAC) 75 MG tablet Take 75 mg by mouth. Pt takes as needed      . vitamin B-12 (CYANOCOBALAMIN) 500 MCG tablet Take 500 mcg by mouth daily.       No current facility-administered medications for this visit.    REVIEW OF SYSTEMS:  A comprehensive review of systems was negative.   PHYSICAL EXAMINATION: General appearance: alert, cooperative and no distress Head: Normocephalic, without obvious abnormality, atraumatic Neck: no adenopathy Lymph nodes: Cervical, supraclavicular, and axillary nodes normal. Resp: clear to auscultation bilaterally Cardio: regular rate and rhythm, S1, S2 normal, no murmur, click, rub or gallop GI: soft, non-tender; bowel sounds normal; no masses,  no organomegaly Extremities: extremities normal, atraumatic, no cyanosis or edema Neurologic: Alert and oriented X 3, normal strength and tone. Normal symmetric reflexes. Normal coordination and gait  ECOG PERFORMANCE STATUS: 1 - Symptomatic but completely ambulatory  Blood pressure 125/81, pulse  81, temperature 97.6 F (36.4 C), temperature source Oral, resp. rate 18, height 5\' 2"  (1.575 m), weight 150 lb 8 oz (68.266 kg).  LABORATORY DATA: Lab Results  Component Value Date   WBC 3.6* 09/03/2012   HGB 12.9 09/03/2012   HCT 39.0 09/03/2012   MCV 98.5 09/03/2012   PLT 254 09/03/2012      Chemistry      Component Value Date/Time   NA 141 08/13/2012 1015   NA 139 01/24/2012 1008   NA 140 08/21/2011  1306   K 3.4* 08/13/2012 1015   K 4.2 01/24/2012 1008   K 4.1 08/21/2011 1306   CL 109* 08/13/2012 1015   CL 103 01/24/2012 1008   CL 107 08/21/2011 1306   CO2 24 08/13/2012 1015   CO2 28 01/24/2012 1008   CO2 22 08/21/2011 1306   BUN 9.7 08/13/2012 1015   BUN 13 01/24/2012 1008   BUN 11 08/21/2011 1306   CREATININE 0.7 08/13/2012 1015   CREATININE 0.8 01/24/2012 1008   CREATININE 0.81 08/21/2011 1306      Component Value Date/Time   CALCIUM 8.5 08/13/2012 1015   CALCIUM 8.6 01/24/2012 1008   CALCIUM 8.8 08/21/2011 1306   ALKPHOS 80 08/13/2012 1015   ALKPHOS 79 01/24/2012 1008   ALKPHOS 87 08/21/2011 1306   AST 22 08/13/2012 1015   AST 24 01/24/2012 1008   AST 17 08/21/2011 1306   ALT 13 08/13/2012 1015   ALT 8 08/21/2011 1306   BILITOT 0.58 08/13/2012 1015   BILITOT 0.90 01/24/2012 1008   BILITOT 0.6 08/21/2011 1306       RADIOGRAPHIC STUDIES: Ct Chest Wo Contrast  08/31/2012  *RADIOLOGY REPORT*  Clinical Data:  Non small cell lung cancer, recurrent.  Diagnosed in 2008 with 8 cm lung mass.  Status post left upper lobectomy 06/29/2006.  Chemotherapy in 2008 and 2011.  CT CHEST, ABDOMEN AND PELVIS WITHOUT  CONTRAST  Technique:  Contiguous axial images of the chest abdomen and pelvis were obtained without  IV contrast administration.  Comparison: 06/08/2012   CT CHEST  Findings: Lung windows demonstrate posterior right upper lobe spiculated lung nodule which measures 1.9 x 2.3 cm on image 24/series 4.  This is similar to 2.0 x 2.4 cm on the prior exam. A more anterior spiculated right upper lobe nodule measures 9 mm on image 26/series 4 and is unchanged. Index inferior right middle lobe nodule measures 8 mm on image 39/series 4 and is unchanged. Similar subpleural 4 mm right lower lobe nodule on image 30/series 4. Similar 3 mm subpleural left lung nodule on image 20/series four. No new nodules. No pleural fluid.  There is minimal left-sided pleural thickening which is similar and likely postoperative,  including on image 18/series two.  Soft tissue windows demonstrate no supraclavicular adenopathy.  A right-sided Port-A-Cath which terminates at the low SVC. Normal heart size, without pericardial effusion.  LAD coronary artery atherosclerosis.  No mediastinal or definite hilar adenopathy, given limitations of unenhanced CT.  IMPRESSION:  1.  No change in size of bilateral pulmonary nodules.  The dominant posterior right upper lobe nodule is similar in size. 2.  Coronary artery atherosclerosis.   CT ABDOMEN AND PELVIS  Findings:  Normal uninfused appearance of the liver, spleen, stomach, pancreas, gallbladder, biliary tract, adrenal glands, kidneys.  No retroperitoneal or retrocrural adenopathy.  Scattered colonic diverticula.  Moderate colonic stool burden. Normal small bowel without abdominal ascites.    No evidence of omental or peritoneal disease.  No pelvic adenopathy.  Hysterectomy.  Similar low density right adnexal lesion.  4.5 x 3.3 cm. No significant free fluid.  Mild osteopenia.  No worrisome osseous lesions.  Mild Schmorl's node deformities involving superior endplates at T11-L1.  IMPRESSION:  1. No acute process or evidence of metastatic disease in the abdomen or pelvis. 2.  Similar low density right adnexal lesion, indeterminate.   Original Report Authenticated By: Jeronimo Greaves, M.D.     ASSESSMENT: this is a very pleasant 79 years old white female with metastatic non-small cell lung cancer, adenocarcinoma, currently undergoing systemic chemotherapy with maintenance Alimta. The patient is tolerating her treatment well with no significant evidence for disease progression on his recent scan.  PLAN: I discussed the scan results and showed the images to the patient today. I recommended for her to continue her treatment with maintenance Alimta as scheduled. The patient was started cycle number 10 today. She would come back for follow up visit in 3 weeks with the next cycle of her chemotherapy.  The  patient was advised to call immediately if she has any concerning symptoms in the interval.  All questions were answered. The patient knows to call the clinic with any problems, questions or concerns. We can certainly see the patient much sooner if necessary.  I spent 15 minutes counseling the patient face to face. The total time spent in the appointment was 25 minutes.

## 2012-09-04 NOTE — Patient Instructions (Signed)
Your scan showed no evidence for disease progression. Continue maintenance and Alimta. Followup in 3 weeks with the next cycle of chemotherapy

## 2012-09-24 ENCOUNTER — Ambulatory Visit (HOSPITAL_BASED_OUTPATIENT_CLINIC_OR_DEPARTMENT_OTHER): Payer: Self-pay | Admitting: Physician Assistant

## 2012-09-24 ENCOUNTER — Telehealth: Payer: Self-pay | Admitting: Internal Medicine

## 2012-09-24 ENCOUNTER — Other Ambulatory Visit (HOSPITAL_BASED_OUTPATIENT_CLINIC_OR_DEPARTMENT_OTHER): Payer: Medicare Other | Admitting: Lab

## 2012-09-24 ENCOUNTER — Ambulatory Visit (HOSPITAL_BASED_OUTPATIENT_CLINIC_OR_DEPARTMENT_OTHER): Payer: Medicare Other

## 2012-09-24 DIAGNOSIS — Z5111 Encounter for antineoplastic chemotherapy: Secondary | ICD-10-CM

## 2012-09-24 DIAGNOSIS — C3410 Malignant neoplasm of upper lobe, unspecified bronchus or lung: Secondary | ICD-10-CM

## 2012-09-24 DIAGNOSIS — C801 Malignant (primary) neoplasm, unspecified: Secondary | ICD-10-CM

## 2012-09-24 DIAGNOSIS — C3490 Malignant neoplasm of unspecified part of unspecified bronchus or lung: Secondary | ICD-10-CM

## 2012-09-24 LAB — CBC WITH DIFFERENTIAL/PLATELET
Basophils Absolute: 0 10*3/uL (ref 0.0–0.1)
Eosinophils Absolute: 0 10*3/uL (ref 0.0–0.5)
HCT: 39.3 % (ref 34.8–46.6)
HGB: 13.2 g/dL (ref 11.6–15.9)
LYMPH%: 44.4 % (ref 14.0–49.7)
MCV: 98 fL (ref 79.5–101.0)
MONO#: 0.4 10*3/uL (ref 0.1–0.9)
MONO%: 11.8 % (ref 0.0–14.0)
NEUT#: 1.3 10*3/uL — ABNORMAL LOW (ref 1.5–6.5)
Platelets: 217 10*3/uL (ref 145–400)
RBC: 4.01 10*6/uL (ref 3.70–5.45)
WBC: 3 10*3/uL — ABNORMAL LOW (ref 3.9–10.3)
nRBC: 0 % (ref 0–0)

## 2012-09-24 LAB — COMPREHENSIVE METABOLIC PANEL (CC13)
ALT: 9 U/L (ref 0–55)
AST: 20 U/L (ref 5–34)
Albumin: 3.2 g/dL — ABNORMAL LOW (ref 3.5–5.0)
Alkaline Phosphatase: 71 U/L (ref 40–150)
BUN: 13.7 mg/dL (ref 7.0–26.0)
CO2: 23 meq/L (ref 22–29)
Calcium: 8.5 mg/dL (ref 8.4–10.4)
Chloride: 111 meq/L — ABNORMAL HIGH (ref 98–107)
Creatinine: 0.7 mg/dL (ref 0.6–1.1)
Glucose: 105 mg/dL — ABNORMAL HIGH (ref 70–99)
Potassium: 3.7 meq/L (ref 3.5–5.1)
Sodium: 142 meq/L (ref 136–145)
Total Bilirubin: 0.72 mg/dL (ref 0.20–1.20)
Total Protein: 6.3 g/dL — ABNORMAL LOW (ref 6.4–8.3)

## 2012-09-24 MED ORDER — SODIUM CHLORIDE 0.9 % IV SOLN
Freq: Once | INTRAVENOUS | Status: AC
  Administered 2012-09-24: 11:00:00 via INTRAVENOUS

## 2012-09-24 MED ORDER — HEPARIN SOD (PORK) LOCK FLUSH 100 UNIT/ML IV SOLN
500.0000 [IU] | Freq: Once | INTRAVENOUS | Status: AC | PRN
  Administered 2012-09-24: 500 [IU]
  Filled 2012-09-24: qty 5

## 2012-09-24 MED ORDER — SODIUM CHLORIDE 0.9 % IV SOLN
500.0000 mg/m2 | Freq: Once | INTRAVENOUS | Status: AC
  Administered 2012-09-24: 900 mg via INTRAVENOUS
  Filled 2012-09-24: qty 36

## 2012-09-24 MED ORDER — SODIUM CHLORIDE 0.9 % IJ SOLN
10.0000 mL | INTRAMUSCULAR | Status: DC | PRN
Start: 2012-09-24 — End: 2012-09-24
  Administered 2012-09-24: 10 mL
  Filled 2012-09-24: qty 10

## 2012-09-24 MED ORDER — DEXAMETHASONE SODIUM PHOSPHATE 10 MG/ML IJ SOLN
10.0000 mg | Freq: Once | INTRAMUSCULAR | Status: AC
  Administered 2012-09-24: 10 mg via INTRAVENOUS

## 2012-09-24 MED ORDER — ONDANSETRON 8 MG/50ML IVPB (CHCC)
8.0000 mg | Freq: Once | INTRAVENOUS | Status: AC
  Administered 2012-09-24: 8 mg via INTRAVENOUS

## 2012-09-24 NOTE — Patient Instructions (Addendum)
Followup with Dr. Arbutus Ped in 3 weeks prior to her next scheduled cycle of chemotherapy

## 2012-09-24 NOTE — Patient Instructions (Addendum)
[facility] Discharge Instructions for Patients Receiving Chemotherapy  Today you received the following chemotherapy agents Alimta.  To help prevent nausea and vomiting after your treatment, we encourage you to take your nausea medication.   If you develop nausea and vomiting that is not controlled by your nausea medication, call the clinic. If it is after clinic hours your family physician or the after hours number for the clinic or go to the Emergency Department.   BELOW ARE SYMPTOMS THAT SHOULD BE REPORTED IMMEDIATELY:  *FEVER GREATER THAN 100.5 F  *CHILLS WITH OR WITHOUT FEVER  NAUSEA AND VOMITING THAT IS NOT CONTROLLED WITH YOUR NAUSEA MEDICATION  *UNUSUAL SHORTNESS OF BREATH  *UNUSUAL BRUISING OR BLEEDING  TENDERNESS IN MOUTH AND THROAT WITH OR WITHOUT PRESENCE OF ULCERS  *URINARY PROBLEMS  *BOWEL PROBLEMS  UNUSUAL RASH Items with * indicate a potential emergency and should be followed up as soon as possible.  One of the nurses will contact you 24 hours after your treatment. Please let the nurse know about any problems that you may have experienced. Feel free to call the clinic you have any questions or concerns. The clinic phone number is [phone].   I have been informed and understand all the instructions given to me. I know to contact the clinic, my physician, or go to the Emergency Department if any problems should occur. I do not have any questions at this time, but understand that I may call the clinic during office hours   should I have any questions or need assistance in obtaining follow up care.    __________________________________________  _____________  __________ Signature of Patient or Authorized Representative            Date                   Time    __________________________________________ Nurse's Signature

## 2012-09-24 NOTE — Progress Notes (Signed)
Ok to treat with ANC 1.3 per Tiana Loft PAC.

## 2012-09-28 NOTE — Progress Notes (Signed)
[facility] Telephone:(336) [phone]   Fax:([phone]) [phone]  OFFICE PROGRESS NOTE  [name],[name], MD [address] [place] [address]  Principle Diagnosis: Recurrent non-small cell lung cancer, initially diagnosed a stage IB (T2, N0, M0) adenocarcinoma in June 2008 with a tumor size of 8 cm.   Prior Therapy:  #1 status post left upper lobectomy with lymph node dissection under the care of Dr. Edwyna Shell on 06/29/2006.  #2 status post 4 cycles of adjuvant chemotherapy with cisplatin and Taxotere last dose given 08/01/2006.  #3 status post 6 cycles of systemic chemotherapy with carboplatin and Alimta for disease recurrence last dose given 12/12/2009.  #4 Maintenance chemotherapy with Alimta at 500 mg per meter squared given every 3 weeks status post 23 cycles.   Current therapy: Patient has resumed systemic chemotherapy with Alimta at 500 mg per meter squared given every 3 weeks status post 10 cycles. Of note the patient is not on dexamethasone with the Alimta due to rash/flushing from the dexamethasone.    INTERVAL HISTORY: [name] LEFEBER 79 y.o. female returns to the clinic today for followup visit. The patient is feeling fine today with no specific complaints. She denied having any significant chest pain, shortness breath, cough or hemoptysis. She denied having any significant weight loss or night sweats. The patient is tolerating her maintenance chemotherapy with single agent Alimta fairly well with no significant adverse effects.   MEDICAL HISTORY: Past Medical History  Diagnosis Date  . lung ca dx'd 06/2006    rt and lt lung  . History of migraine headaches   . Hypercholesterolemia   . Lung cancer     ALLERGIES:  is allergic to ondansetron hcl; simvastatin; and iodinated diagnostic agents.  MEDICATIONS:  Current Outpatient Prescriptions  Medication Sig Dispense Refill  . Calcium Carbonate-Vit D-Min (CALTRATE PLUS PO) Take 500 mg by mouth 2 (two) times  daily.        . folic acid (FOLVITE) 1 MG tablet Take 1 mg by mouth daily.        . influenza virus trivalent vaccine (FLUZONE) injection Inject 0.5 mLs into the muscle once.  0.5 mL  0  . Multiple Vitamins-Minerals (CENTRUM SILVER PO) Take 1 tablet by mouth daily.       . prochlorperazine (COMPAZINE) 10 MG tablet Take 10 mg by mouth every 6 (six) hours as needed.        . ranitidine (ZANTAC) 75 MG tablet Take 75 mg by mouth. Pt takes as needed      . vitamin B-12 (CYANOCOBALAMIN) 500 MCG tablet Take 500 mcg by mouth daily.       No current facility-administered medications for this visit.    REVIEW OF SYSTEMS:  A comprehensive review of systems was negative.   PHYSICAL EXAMINATION: General appearance: alert, cooperative and no distress Head: Normocephalic, without obvious abnormality, atraumatic Neck: no adenopathy Lymph nodes: Cervical, supraclavicular, and axillary nodes normal. Resp: clear to auscultation bilaterally Cardio: regular rate and rhythm, S1, S2 normal, no murmur, click, rub or gallop GI: soft, non-tender; bowel sounds normal; no masses,  no organomegaly Extremities: extremities normal, atraumatic, no cyanosis or edema Neurologic: Alert and oriented X 3, normal strength and tone. Normal symmetric reflexes. Normal coordination and gait  ECOG PERFORMANCE STATUS: 1 - Symptomatic but completely ambulatory  Blood pressure 127/71, pulse 69, temperature 97.5 F (36.4 C), temperature source Oral, resp. rate 20, height 5\' 2"  (1.575 m), weight 151 lb 8 oz (68.72 kg).  LABORATORY DATA: Lab Results  Component Value Date   WBC 3.0* 09/24/2012   HGB 13.2 09/24/2012   HCT 39.3 09/24/2012   MCV 98.0 09/24/2012   PLT 217 09/24/2012      Chemistry      Component Value Date/Time   NA 142 09/24/2012 0938   NA 139 01/24/2012 1008   NA 140 08/21/2011 1306   K 3.7 09/24/2012 0938   K 4.2 01/24/2012 1008   K 4.1 08/21/2011 1306   CL 111* 09/24/2012 0938   CL 103 01/24/2012 1008   CL 107  08/21/2011 1306   CO2 23 09/24/2012 0938   CO2 28 01/24/2012 1008   CO2 22 08/21/2011 1306   BUN 13.7 09/24/2012 0938   BUN 13 01/24/2012 1008   BUN 11 08/21/2011 1306   CREATININE 0.7 09/24/2012 0938   CREATININE 0.8 01/24/2012 1008   CREATININE 0.81 08/21/2011 1306      Component Value Date/Time   CALCIUM 8.5 09/24/2012 0938   CALCIUM 8.6 01/24/2012 1008   CALCIUM 8.8 08/21/2011 1306   ALKPHOS 71 09/24/2012 0938   ALKPHOS 79 01/24/2012 1008   ALKPHOS 87 08/21/2011 1306   AST 20 09/24/2012 0938   AST 24 01/24/2012 1008   AST 17 08/21/2011 1306   ALT 9 09/24/2012 0938   ALT 8 08/21/2011 1306   BILITOT 0.72 09/24/2012 0938   BILITOT 0.90 01/24/2012 1008   BILITOT 0.6 08/21/2011 1306       RADIOGRAPHIC STUDIES: Ct Chest Wo Contrast  08/31/2012  *RADIOLOGY REPORT*  Clinical Data:  Non small cell lung cancer, recurrent.  Diagnosed in 2008 with 8 cm lung mass.  Status post left upper lobectomy 06/29/2006.  Chemotherapy in 2008 and 2011.  CT CHEST, ABDOMEN AND PELVIS WITHOUT  CONTRAST  Technique:  Contiguous axial images of the chest abdomen and pelvis were obtained without  IV contrast administration.  Comparison: 06/08/2012   CT CHEST  Findings: Lung windows demonstrate posterior right upper lobe spiculated lung nodule which measures 1.9 x 2.3 cm on image 24/series 4.  This is similar to 2.0 x 2.4 cm on the prior exam. A more anterior spiculated right upper lobe nodule measures 9 mm on image 26/series 4 and is unchanged. Index inferior right middle lobe nodule measures 8 mm on image 39/series 4 and is unchanged. Similar subpleural 4 mm right lower lobe nodule on image 30/series 4. Similar 3 mm subpleural left lung nodule on image 20/series four. No new nodules. No pleural fluid.  There is minimal left-sided pleural thickening which is similar and likely postoperative, including on image 18/series two.  Soft tissue windows demonstrate no supraclavicular adenopathy.  A right-sided Port-A-Cath which terminates at  the low SVC. Normal heart size, without pericardial effusion.  LAD coronary artery atherosclerosis.  No mediastinal or definite hilar adenopathy, given limitations of unenhanced CT.  IMPRESSION:  1.  No change in size of bilateral pulmonary nodules.  The dominant posterior right upper lobe nodule is similar in size. 2.  Coronary artery atherosclerosis.   CT ABDOMEN AND PELVIS  Findings:  Normal uninfused appearance of the liver, spleen, stomach, pancreas, gallbladder, biliary tract, adrenal glands, kidneys.  No retroperitoneal or retrocrural adenopathy.  Scattered colonic diverticula.  Moderate colonic stool burden. Normal small bowel without abdominal ascites.    No evidence of omental or peritoneal disease.  No pelvic adenopathy.  Hysterectomy.  Similar low density right adnexal lesion.  4.5 x 3.3 cm. No significant free fluid.  Mild osteopenia.  No worrisome osseous lesions.  Mild Schmorl's node deformities involving superior endplates at T11-L1.  IMPRESSION:  1. No acute process or evidence of metastatic disease in the abdomen or pelvis. 2.  Similar low density right adnexal lesion, indeterminate.   Original Report Authenticated By: Jeronimo Greaves, M.D.     ASSESSMENT/ PLAN: this is a very pleasant 79 years old white female with metastatic non-small cell lung cancer, adenocarcinoma, currently undergoing systemic chemotherapy with maintenance Alimta. The patient is tolerating her treatment well with no significant evidence for disease progression on her recent scan. She is status post 10 cycles. Patient was discussed Dr. Arbutus Ped. She will continue on her current chemotherapy with maintenance Alimta at 500 mg read square given every 3 weeks. She'll followup with Dr. Arbutus Ped in 3 weeks with repeat CBC differential and C. met.  Laural Benes, Jaamal Farooqui E   The patient was advised to call immediately if she has any concerning symptoms in the interval.  All questions were answered. The patient knows to call the clinic  with any problems, questions or concerns. We can certainly see the patient much sooner if necessary.  I spent 20 minutes counseling the patient face to face. The total time spent in the appointment was 30 minutes.

## 2012-10-15 ENCOUNTER — Other Ambulatory Visit (HOSPITAL_BASED_OUTPATIENT_CLINIC_OR_DEPARTMENT_OTHER): Payer: Medicare Other | Admitting: Lab

## 2012-10-15 ENCOUNTER — Ambulatory Visit (HOSPITAL_BASED_OUTPATIENT_CLINIC_OR_DEPARTMENT_OTHER): Payer: Medicare Other

## 2012-10-15 ENCOUNTER — Ambulatory Visit (HOSPITAL_BASED_OUTPATIENT_CLINIC_OR_DEPARTMENT_OTHER): Payer: Medicare Other | Admitting: Internal Medicine

## 2012-10-15 ENCOUNTER — Telehealth: Payer: Self-pay | Admitting: Internal Medicine

## 2012-10-15 ENCOUNTER — Encounter: Payer: Self-pay | Admitting: Internal Medicine

## 2012-10-15 DIAGNOSIS — C3410 Malignant neoplasm of upper lobe, unspecified bronchus or lung: Secondary | ICD-10-CM

## 2012-10-15 DIAGNOSIS — C3490 Malignant neoplasm of unspecified part of unspecified bronchus or lung: Secondary | ICD-10-CM

## 2012-10-15 DIAGNOSIS — Z5111 Encounter for antineoplastic chemotherapy: Secondary | ICD-10-CM

## 2012-10-15 DIAGNOSIS — C801 Malignant (primary) neoplasm, unspecified: Secondary | ICD-10-CM

## 2012-10-15 LAB — CBC WITH DIFFERENTIAL/PLATELET
Basophils Absolute: 0 10*3/uL (ref 0.0–0.1)
Eosinophils Absolute: 0 10*3/uL (ref 0.0–0.5)
HGB: 13.5 g/dL (ref 11.6–15.9)
LYMPH%: 38.3 % (ref 14.0–49.7)
MCH: 32.8 pg (ref 25.1–34.0)
MCV: 98.5 fL (ref 79.5–101.0)
MONO%: 10.7 % (ref 0.0–14.0)
NEUT#: 2.1 10*3/uL (ref 1.5–6.5)
Platelets: 246 10*3/uL (ref 145–400)
RBC: 4.12 10*6/uL (ref 3.70–5.45)

## 2012-10-15 LAB — COMPREHENSIVE METABOLIC PANEL (CC13)
BUN: 12.1 mg/dL (ref 7.0–26.0)
CO2: 23 mEq/L (ref 22–29)
Creatinine: 0.7 mg/dL (ref 0.6–1.1)
Glucose: 88 mg/dl (ref 70–99)
Total Bilirubin: 0.64 mg/dL (ref 0.20–1.20)

## 2012-10-15 MED ORDER — SODIUM CHLORIDE 0.9 % IV SOLN
Freq: Once | INTRAVENOUS | Status: AC
  Administered 2012-10-15: 12:00:00 via INTRAVENOUS

## 2012-10-15 MED ORDER — CYANOCOBALAMIN 1000 MCG/ML IJ SOLN: 1000.0000 ug | Freq: Once | INTRAMUSCULAR | Status: DC

## 2012-10-15 MED ORDER — DEXAMETHASONE SODIUM PHOSPHATE 10 MG/ML IJ SOLN
10.0000 mg | Freq: Once | INTRAMUSCULAR | Status: AC
  Administered 2012-10-15: 10 mg via INTRAVENOUS

## 2012-10-15 MED ORDER — SODIUM CHLORIDE 0.9 % IJ SOLN
10.0000 mL | INTRAMUSCULAR | Status: DC | PRN
  Administered 2012-10-15: 10 mL
  Filled 2012-10-15: qty 10

## 2012-10-15 MED ORDER — PEMETREXED DISODIUM CHEMO INJECTION 500 MG
500.0000 mg/m2 | Freq: Once | INTRAVENOUS | Status: AC
  Administered 2012-10-15: 900 mg via INTRAVENOUS
  Filled 2012-10-15: qty 36

## 2012-10-15 MED ORDER — HEPARIN SOD (PORK) LOCK FLUSH 100 UNIT/ML IV SOLN
500.0000 [IU] | Freq: Once | INTRAVENOUS | Status: AC | PRN
  Administered 2012-10-15: 500 [IU]
  Filled 2012-10-15: qty 5

## 2012-10-15 MED ORDER — ONDANSETRON 8 MG/50ML IVPB (CHCC)
8.0000 mg | Freq: Once | INTRAVENOUS | Status: AC
  Administered 2012-10-15: 8 mg via INTRAVENOUS

## 2012-10-15 NOTE — Progress Notes (Signed)
[facility] [phone]   Fax:(336) [phone]  OFFICE PROGRESS NOTE  [name],[name], MD [address] [place] [address]  Principle Diagnosis: Recurrent non-small cell lung cancer, initially diagnosed a stage IB (T2, N0, M0) adenocarcinoma in June 2008 with a tumor size of 8 cm.   Prior Therapy:  #1 status post left upper lobectomy with lymph node dissection under the care of Dr. Edwyna Shell on 06/29/2006.  #2 status post 4 cycles of adjuvant chemotherapy with cisplatin and Taxotere last dose given 08/01/2006.  #3 status post 6 cycles of systemic chemotherapy with carboplatin and Alimta for disease recurrence last dose given 12/12/2009.  #4 Maintenance chemotherapy with Alimta at 500 mg per meter squared given every 3 weeks status post 23 cycles.   Current therapy: The patient has resumed systemic chemotherapy with Alimta at 500 mg per meter squared given every 3 weeks status post 10 cycles. Of note the patient is not on dexamethasone with the Alimta due to rash/flushing from the dexamethasone.   INTERVAL HISTORY: [name] 79 y.o. female returns to the clinic today for followup visit. The patient tolerated the last cycle of her maintenance treatment fairly well with no significant adverse effects. She denied having any fever or chills. She has no nausea or vomiting. The patient denied having any significant chest pain, shortness breath, cough or hemoptysis. She is here today to start cycle #11 of her chemotherapy.   MEDICAL HISTORY: Past Medical History  Diagnosis Date  . lung ca dx'd 06/2006    rt and lt lung  . History of migraine headaches   . Hypercholesterolemia   . Lung cancer     ALLERGIES:  is allergic to ondansetron hcl; simvastatin; and iodinated diagnostic agents.  MEDICATIONS:  Current Outpatient Prescriptions  Medication Sig Dispense Refill  . Calcium Carbonate-Vit D-Min (CALTRATE PLUS PO) Take 500 mg by mouth 2 (two) times  daily.        . folic acid (FOLVITE) 1 MG tablet Take 1 mg by mouth daily.        . Multiple Vitamins-Minerals (CENTRUM SILVER PO) Take 1 tablet by mouth daily.       . ranitidine (ZANTAC) 75 MG tablet Take 75 mg by mouth. Pt takes as needed      . vitamin B-12 (CYANOCOBALAMIN) 500 MCG tablet Take 500 mcg by mouth daily.      . influenza virus trivalent vaccine (FLUZONE) injection Inject 0.5 mLs into the muscle once.  0.5 mL  0  . prochlorperazine (COMPAZINE) 10 MG tablet Take 10 mg by mouth every 6 (six) hours as needed.         No current facility-administered medications for this visit.    REVIEW OF SYSTEMS:  A comprehensive review of systems was negative.   PHYSICAL EXAMINATION: General appearance: alert, cooperative and no distress Head: Normocephalic, without obvious abnormality, atraumatic Neck: no adenopathy Lymph nodes: Cervical, supraclavicular, and axillary nodes normal. Resp: clear to auscultation bilaterally Cardio: regular rate and rhythm, S1, S2 normal, no murmur, click, rub or gallop GI: soft, non-tender; bowel sounds normal; no masses,  no organomegaly Extremities: extremities normal, atraumatic, no cyanosis or edema  ECOG PERFORMANCE STATUS: 0 - Asymptomatic  Blood pressure 132/78, pulse 69, temperature 97.3 F (36.3 C), temperature source Oral, resp. rate 18, height 5\' 2"  (1.575 m), weight 151 lb 4.8 oz (68.629 kg).  LABORATORY DATA: Lab Results  Component Value Date   WBC 4.3 10/15/2012  HGB 13.5 10/15/2012   HCT 40.6 10/15/2012   MCV 98.5 10/15/2012   PLT 246 10/15/2012      Chemistry      Component Value Date/Time   NA 142 09/24/2012 0938   NA 139 01/24/2012 1008   NA 140 08/21/2011 1306   K 3.7 09/24/2012 0938   K 4.2 01/24/2012 1008   K 4.1 08/21/2011 1306   CL 111* 09/24/2012 0938   CL 103 01/24/2012 1008   CL 107 08/21/2011 1306   CO2 23 09/24/2012 0938   CO2 28 01/24/2012 1008   CO2 22 08/21/2011 1306   BUN 13.7 09/24/2012 0938   BUN 13 01/24/2012 1008    BUN 11 08/21/2011 1306   CREATININE 0.7 09/24/2012 0938   CREATININE 0.8 01/24/2012 1008   CREATININE 0.81 08/21/2011 1306      Component Value Date/Time   CALCIUM 8.5 09/24/2012 0938   CALCIUM 8.6 01/24/2012 1008   CALCIUM 8.8 08/21/2011 1306   ALKPHOS 71 09/24/2012 0938   ALKPHOS 79 01/24/2012 1008   ALKPHOS 87 08/21/2011 1306   AST 20 09/24/2012 0938   AST 24 01/24/2012 1008   AST 17 08/21/2011 1306   ALT 9 09/24/2012 0938   ALT 8 08/21/2011 1306   BILITOT 0.72 09/24/2012 0938   BILITOT 0.90 01/24/2012 1008   BILITOT 0.6 08/21/2011 1306       RADIOGRAPHIC STUDIES: No results found.  ASSESSMENT: this is a very pleasant 79 years old white female with recurrent non-small cell lung cancer currently on systemic chemotherapy with maintenance Alimta 500 mg/M2 every 3 weeks.   PLAN: The patient is doing fine and tolerating her treatment fairly well. We'll proceed with cycle #11 today as scheduled. The patient would come back for follow up visit in 3 weeks with the next cycle of her chemotherapy. She was advised to call immediately if she has any concerning symptoms in the interval.  All questions were answered. The patient knows to call the clinic with any problems, questions or concerns. We can certainly see the patient much sooner if necessary.

## 2012-10-15 NOTE — Patient Instructions (Signed)
[facility] Discharge Instructions for Patients Receiving Chemotherapy  Today you received the following chemotherapy agents Alimta.  To help prevent nausea and vomiting after your treatment, we encourage you to take your nausea medication.   If you develop nausea and vomiting that is not controlled by your nausea medication, call the clinic. If it is after clinic hours your family physician or the after hours number for the clinic or go to the Emergency Department.   BELOW ARE SYMPTOMS THAT SHOULD BE REPORTED IMMEDIATELY:  *FEVER GREATER THAN 100.5 F  *CHILLS WITH OR WITHOUT FEVER  NAUSEA AND VOMITING THAT IS NOT CONTROLLED WITH YOUR NAUSEA MEDICATION  *UNUSUAL SHORTNESS OF BREATH  *UNUSUAL BRUISING OR BLEEDING  TENDERNESS IN MOUTH AND THROAT WITH OR WITHOUT PRESENCE OF ULCERS  *URINARY PROBLEMS  *BOWEL PROBLEMS  UNUSUAL RASH Items with * indicate a potential emergency and should be followed up as soon as possible.  One of the nurses will contact you 24 hours after your treatment. Please let the nurse know about any problems that you may have experienced. Feel free to call the clinic you have any questions or concerns. The clinic phone number is [phone].   I have been informed and understand all the instructions given to me. I know to contact the clinic, my physician, or go to the Emergency Department if any problems should occur. I do not have any questions at this time, but understand that I may call the clinic during office hours   should I have any questions or need assistance in obtaining follow up care.    __________________________________________  _____________  __________ Signature of Patient or Authorized Representative            Date                   Time    __________________________________________ Nurse's Signature    

## 2012-10-16 ENCOUNTER — Telehealth: Payer: Self-pay | Admitting: *Deleted

## 2012-10-16 ENCOUNTER — Telehealth: Payer: Self-pay | Admitting: Internal Medicine

## 2012-10-16 NOTE — Telephone Encounter (Signed)
Per staff message and POF I have scheduled appts.  JMW  

## 2012-10-17 NOTE — Patient Instructions (Signed)
Continue maintenance chemotherapy today as scheduled. Followup visit in 3 weeks with the next cycle of treatment. 

## 2012-11-04 ENCOUNTER — Other Ambulatory Visit (HOSPITAL_BASED_OUTPATIENT_CLINIC_OR_DEPARTMENT_OTHER): Payer: Medicare Other | Admitting: Lab

## 2012-11-04 ENCOUNTER — Telehealth: Payer: Self-pay | Admitting: Internal Medicine

## 2012-11-04 ENCOUNTER — Encounter: Payer: Self-pay | Admitting: Internal Medicine

## 2012-11-04 ENCOUNTER — Ambulatory Visit (HOSPITAL_BASED_OUTPATIENT_CLINIC_OR_DEPARTMENT_OTHER): Payer: Medicare Other | Admitting: Internal Medicine

## 2012-11-04 ENCOUNTER — Ambulatory Visit (HOSPITAL_BASED_OUTPATIENT_CLINIC_OR_DEPARTMENT_OTHER): Payer: Medicare Other

## 2012-11-04 DIAGNOSIS — C3410 Malignant neoplasm of upper lobe, unspecified bronchus or lung: Secondary | ICD-10-CM

## 2012-11-04 DIAGNOSIS — Z5111 Encounter for antineoplastic chemotherapy: Secondary | ICD-10-CM

## 2012-11-04 DIAGNOSIS — C801 Malignant (primary) neoplasm, unspecified: Secondary | ICD-10-CM

## 2012-11-04 LAB — CBC WITH DIFFERENTIAL/PLATELET
Eosinophils Absolute: 0.1 10*3/uL (ref 0.0–0.5)
HCT: 38.6 % (ref 34.8–46.6)
HGB: 12.7 g/dL (ref 11.6–15.9)
LYMPH%: 38.6 % (ref 14.0–49.7)
MONO#: 0.5 10*3/uL (ref 0.1–0.9)
NEUT#: 1.7 10*3/uL (ref 1.5–6.5)
NEUT%: 45.1 % (ref 38.4–76.8)
Platelets: 202 10*3/uL (ref 145–400)
RBC: 3.9 10*6/uL (ref 3.70–5.45)
WBC: 3.8 10*3/uL — ABNORMAL LOW (ref 3.9–10.3)

## 2012-11-04 LAB — COMPREHENSIVE METABOLIC PANEL (CC13)
Alkaline Phosphatase: 78 U/L (ref 40–150)
BUN: 16.3 mg/dL (ref 7.0–26.0)
Chloride: 108 mEq/L — ABNORMAL HIGH (ref 98–107)
Creatinine: 0.7 mg/dL (ref 0.6–1.1)
Glucose: 104 mg/dl — ABNORMAL HIGH (ref 70–99)
Potassium: 3.8 mEq/L (ref 3.5–5.1)
Total Bilirubin: 0.49 mg/dL (ref 0.20–1.20)

## 2012-11-04 MED ORDER — SODIUM CHLORIDE 0.9 % IV SOLN
Freq: Once | INTRAVENOUS | Status: AC
  Administered 2012-11-04: 12:00:00 via INTRAVENOUS

## 2012-11-04 MED ORDER — ONDANSETRON 8 MG/50ML IVPB (CHCC)
8.0000 mg | Freq: Once | INTRAVENOUS | Status: AC
  Administered 2012-11-04: 8 mg via INTRAVENOUS

## 2012-11-04 MED ORDER — SODIUM CHLORIDE 0.9 % IJ SOLN
10.0000 mL | INTRAMUSCULAR | Status: DC | PRN
  Administered 2012-11-04: 10 mL
  Filled 2012-11-04: qty 10

## 2012-11-04 MED ORDER — SODIUM CHLORIDE 0.9 % IV SOLN
500.0000 mg/m2 | Freq: Once | INTRAVENOUS | Status: AC
  Administered 2012-11-04: 900 mg via INTRAVENOUS
  Filled 2012-11-04: qty 36

## 2012-11-04 MED ORDER — HEPARIN SOD (PORK) LOCK FLUSH 100 UNIT/ML IV SOLN
500.0000 [IU] | Freq: Once | INTRAVENOUS | Status: AC | PRN
  Administered 2012-11-04: 500 [IU]
  Filled 2012-11-04: qty 5

## 2012-11-04 MED ORDER — DEXAMETHASONE SODIUM PHOSPHATE 10 MG/ML IJ SOLN
10.0000 mg | Freq: Once | INTRAMUSCULAR | Status: AC
  Administered 2012-11-04: 10 mg via INTRAVENOUS

## 2012-11-04 NOTE — Patient Instructions (Signed)
Continue chemotherapy today as scheduled. Follow up visit in 3 weeks with repeat CT scan of the chest

## 2012-11-04 NOTE — Progress Notes (Signed)
[facility] Telephone:(336) [phone]   Fax:(336) [phone]  OFFICE PROGRESS NOTE  [name],[name], MD [address] [place] [address]  Principle Diagnosis: Recurrent non-small cell lung cancer, initially diagnosed a stage IB (T2, N0, M0) adenocarcinoma in June 2008 with a tumor size of 8 cm.   Prior Therapy:  #1 status post left upper lobectomy with lymph node dissection under the care of Dr. Edwyna Shell on 06/29/2006.  #2 status post 4 cycles of adjuvant chemotherapy with cisplatin and Taxotere last dose given 08/01/2006.  #3 status post 6 cycles of systemic chemotherapy with carboplatin and Alimta for disease recurrence last dose given 12/12/2009.   Current therapy: Maintenance chemotherapy with Alimta at 500 mg per meter squared given every 3 weeks status post 36 cycles.  INTERVAL HISTORY: [name] 79 y.o. female returns to the clinic today for follow up visit. The patient is feeling fine today with no specific complaints she tolerated the last cycle of her maintenance chemotherapy with single agent Alimta fairly well. She denied having any nausea or vomiting. She has no weight loss or night sweats. The patient denied having any fever or chills. She has no chest pain, shortness breath, cough or hemoptysis.  MEDICAL HISTORY: Past Medical History  Diagnosis Date  . lung ca dx'd 06/2006    rt and lt lung  . History of migraine headaches   . Hypercholesterolemia   . Lung cancer     ALLERGIES:  is allergic to ondansetron hcl; simvastatin; and iodinated diagnostic agents.  MEDICATIONS:  Current Outpatient Prescriptions  Medication Sig Dispense Refill  . Calcium Carbonate-Vit D-Min (CALTRATE PLUS PO) Take 500 mg by mouth 2 (two) times daily.        . folic acid (FOLVITE) 1 MG tablet Take 1 mg by mouth daily.        . influenza virus trivalent vaccine (FLUZONE) injection Inject 0.5 mLs into the muscle once.  0.5 mL  0  . Multiple Vitamins-Minerals (CENTRUM  SILVER PO) Take 1 tablet by mouth daily.       . ranitidine (ZANTAC) 75 MG tablet Take 75 mg by mouth. Pt takes as needed      . vitamin B-12 (CYANOCOBALAMIN) 500 MCG tablet Take 500 mcg by mouth daily.      . prochlorperazine (COMPAZINE) 10 MG tablet Take 10 mg by mouth every 6 (six) hours as needed.         No current facility-administered medications for this visit.    REVIEW OF SYSTEMS:  A comprehensive review of systems was negative.   PHYSICAL EXAMINATION: General appearance: alert, cooperative and no distress Head: Normocephalic, without obvious abnormality, atraumatic Neck: no adenopathy Lymph nodes: Cervical, supraclavicular, and axillary nodes normal. Resp: clear to auscultation bilaterally Cardio: regular rate and rhythm, S1, S2 normal, no murmur, click, rub or gallop GI: soft, non-tender; bowel sounds normal; no masses,  no organomegaly Extremities: extremities normal, atraumatic, no cyanosis or edema  ECOG PERFORMANCE STATUS: 1 - Symptomatic but completely ambulatory  Blood pressure 119/74, pulse 77, temperature 98.2 F (36.8 C), temperature source Oral, resp. rate 18, height 5\' 2"  (1.575 m), weight 151 lb 4.8 oz (68.629 kg), SpO2 100.00%.  LABORATORY DATA: Lab Results  Component Value Date   WBC 3.8* 11/04/2012   HGB 12.7 11/04/2012   HCT 38.6 11/04/2012   MCV 99.0 11/04/2012   PLT 202 11/04/2012      Chemistry      Component Value Date/Time  NA 141 10/15/2012 1101   NA 139 01/24/2012 1008   NA 140 08/21/2011 1306   K 3.6 10/15/2012 1101   K 4.2 01/24/2012 1008   K 4.1 08/21/2011 1306   CL 109* 10/15/2012 1101   CL 103 01/24/2012 1008   CL 107 08/21/2011 1306   CO2 23 10/15/2012 1101   CO2 28 01/24/2012 1008   CO2 22 08/21/2011 1306   BUN 12.1 10/15/2012 1101   BUN 13 01/24/2012 1008   BUN 11 08/21/2011 1306   CREATININE 0.7 10/15/2012 1101   CREATININE 0.8 01/24/2012 1008   CREATININE 0.81 08/21/2011 1306      Component Value Date/Time   CALCIUM 8.9 10/15/2012 1101    CALCIUM 8.6 01/24/2012 1008   CALCIUM 8.8 08/21/2011 1306   ALKPHOS 82 10/15/2012 1101   ALKPHOS 79 01/24/2012 1008   ALKPHOS 87 08/21/2011 1306   AST 24 10/15/2012 1101   AST 24 01/24/2012 1008   AST 17 08/21/2011 1306   ALT 15 10/15/2012 1101   ALT 8 08/21/2011 1306   BILITOT 0.64 10/15/2012 1101   BILITOT 0.90 01/24/2012 1008   BILITOT 0.6 08/21/2011 1306       RADIOGRAPHIC STUDIES: No results found.  ASSESSMENT: this is a very pleasant 79 years old white female with recurrent non-small cell lung cancer, adenocarcinoma.   PLAN: the patient is currently on maintenance chemotherapy with single agent Alimta status post 36 cycles and tolerating it fairly well I recommended for the patient to proceed with her treatment today as scheduled. I would see her back for follow up visit in 3 weeks with repeat CT scan of the chest for evaluation of her disease. She was advised to call immediately if she has any concerning symptoms in the interval.  All questions were answered. The patient knows to call the clinic with any problems, questions or concerns. We can certainly see the patient much sooner if necessary.

## 2012-11-04 NOTE — Patient Instructions (Addendum)
[facility] Discharge Instructions for Patients Receiving Chemotherapy  Today you received the following chemotherapy agents Alimta  To help prevent nausea and vomiting after your treatment, we encourage you to take your nausea medication as directed.   If you develop nausea and vomiting that is not controlled by your nausea medication, call the clinic.   BELOW ARE SYMPTOMS THAT SHOULD BE REPORTED IMMEDIATELY:  *FEVER GREATER THAN 100.5 F  *CHILLS WITH OR WITHOUT FEVER  NAUSEA AND VOMITING THAT IS NOT CONTROLLED WITH YOUR NAUSEA MEDICATION  *UNUSUAL SHORTNESS OF BREATH  *UNUSUAL BRUISING OR BLEEDING  TENDERNESS IN MOUTH AND THROAT WITH OR WITHOUT PRESENCE OF ULCERS  *URINARY PROBLEMS  *BOWEL PROBLEMS  UNUSUAL RASH Items with * indicate a potential emergency and should be followed up as soon as possible.  Feel free to call the clinic you have any questions or concerns. The clinic phone number is [phone].    

## 2012-11-05 ENCOUNTER — Other Ambulatory Visit: Payer: Medicare Other

## 2012-11-25 ENCOUNTER — Other Ambulatory Visit: Payer: Self-pay | Admitting: Internal Medicine

## 2012-11-25 ENCOUNTER — Ambulatory Visit: Payer: Medicare Other

## 2012-11-25 ENCOUNTER — Ambulatory Visit (HOSPITAL_COMMUNITY)
Admission: RE | Admit: 2012-11-25 | Discharge: 2012-11-25 | Disposition: A | Discharge status: Home / Self Care | Payer: Medicare Other | Source: Ambulatory Visit | Attending: Internal Medicine | Admitting: Internal Medicine

## 2012-11-25 DIAGNOSIS — I2510 Atherosclerotic heart disease of native coronary artery without angina pectoris: Secondary | ICD-10-CM | POA: Insufficient documentation

## 2012-11-25 DIAGNOSIS — C3490 Malignant neoplasm of unspecified part of unspecified bronchus or lung: Secondary | ICD-10-CM | POA: Insufficient documentation

## 2012-11-26 ENCOUNTER — Ambulatory Visit (HOSPITAL_BASED_OUTPATIENT_CLINIC_OR_DEPARTMENT_OTHER): Payer: Medicare Other | Admitting: Internal Medicine

## 2012-11-26 ENCOUNTER — Telehealth: Payer: Self-pay | Admitting: *Deleted

## 2012-11-26 ENCOUNTER — Ambulatory Visit (HOSPITAL_BASED_OUTPATIENT_CLINIC_OR_DEPARTMENT_OTHER): Payer: Medicare Other

## 2012-11-26 ENCOUNTER — Other Ambulatory Visit: Payer: Medicare Other | Admitting: Lab

## 2012-11-26 ENCOUNTER — Encounter: Payer: Self-pay | Admitting: Internal Medicine

## 2012-11-26 ENCOUNTER — Other Ambulatory Visit (HOSPITAL_BASED_OUTPATIENT_CLINIC_OR_DEPARTMENT_OTHER): Payer: Medicare Other | Admitting: Lab

## 2012-11-26 ENCOUNTER — Telehealth: Payer: Self-pay | Admitting: Internal Medicine

## 2012-11-26 VITALS — BP 139/66 | HR 63 | Resp 18 | Ht 62.0 in | Wt 150.2 lb

## 2012-11-26 DIAGNOSIS — C801 Malignant (primary) neoplasm, unspecified: Secondary | ICD-10-CM

## 2012-11-26 DIAGNOSIS — C3490 Malignant neoplasm of unspecified part of unspecified bronchus or lung: Secondary | ICD-10-CM

## 2012-11-26 DIAGNOSIS — Z5111 Encounter for antineoplastic chemotherapy: Secondary | ICD-10-CM

## 2012-11-26 DIAGNOSIS — C3410 Malignant neoplasm of upper lobe, unspecified bronchus or lung: Secondary | ICD-10-CM

## 2012-11-26 DIAGNOSIS — C3491 Malignant neoplasm of unspecified part of right bronchus or lung: Secondary | ICD-10-CM

## 2012-11-26 LAB — COMPREHENSIVE METABOLIC PANEL (CC13)
AST: 21 U/L (ref 5–34)
Albumin: 3.3 g/dL — ABNORMAL LOW (ref 3.5–5.0)
Alkaline Phosphatase: 72 U/L (ref 40–150)
BUN: 14 mg/dL (ref 7.0–26.0)
Creatinine: 0.8 mg/dL (ref 0.6–1.1)
Glucose: 137 mg/dl (ref 70–140)
Potassium: 3.4 mEq/L — ABNORMAL LOW (ref 3.5–5.1)
Total Bilirubin: 0.56 mg/dL (ref 0.20–1.20)

## 2012-11-26 LAB — CBC WITH DIFFERENTIAL/PLATELET
Basophils Absolute: 0 10*3/uL (ref 0.0–0.1)
EOS%: 2.1 % (ref 0.0–7.0)
Eosinophils Absolute: 0.1 10*3/uL (ref 0.0–0.5)
HCT: 37.9 % (ref 34.8–46.6)
HGB: 12.8 g/dL (ref 11.6–15.9)
LYMPH%: 49.2 % (ref 14.0–49.7)
MCH: 33.1 pg (ref 25.1–34.0)
MCV: 97.9 fL (ref 79.5–101.0)
MONO%: 13.1 % (ref 0.0–14.0)
NEUT#: 1.1 10*3/uL — ABNORMAL LOW (ref 1.5–6.5)
NEUT%: 35 % — ABNORMAL LOW (ref 38.4–76.8)
Platelets: 190 10*3/uL (ref 145–400)
RDW: 14.5 % (ref 11.2–14.5)

## 2012-11-26 MED ORDER — SODIUM CHLORIDE 0.9 % IV SOLN
400.0000 mg/m2 | Freq: Once | INTRAVENOUS | Status: AC
  Administered 2012-11-26: 700 mg via INTRAVENOUS
  Filled 2012-11-26: qty 28

## 2012-11-26 MED ORDER — SODIUM CHLORIDE 0.9 % IV SOLN
Freq: Once | INTRAVENOUS | Status: AC
  Administered 2012-11-26: 12:00:00 via INTRAVENOUS

## 2012-11-26 MED ORDER — ONDANSETRON 8 MG/50ML IVPB (CHCC)
8.0000 mg | Freq: Once | INTRAVENOUS | Status: AC
  Administered 2012-11-26: 8 mg via INTRAVENOUS

## 2012-11-26 MED ORDER — DEXAMETHASONE SODIUM PHOSPHATE 10 MG/ML IJ SOLN
10.0000 mg | Freq: Once | INTRAMUSCULAR | Status: AC
  Administered 2012-11-26: 10 mg via INTRAVENOUS

## 2012-11-26 NOTE — Progress Notes (Signed)
Per Dr Donnald Garre, okay to tx with CBC results

## 2012-11-26 NOTE — Telephone Encounter (Signed)
Per staff phone call and POF I have schedueld appts.  JMW  

## 2012-11-26 NOTE — Patient Instructions (Signed)
[facility] Discharge Instructions for Patients Receiving Chemotherapy  Today you received the following chemotherapy agents alimta  To help prevent nausea and vomiting after your treatment, we encourage you to take your nausea medication as needed.   If you develop nausea and vomiting that is not controlled by your nausea medication, call the clinic.   BELOW ARE SYMPTOMS THAT SHOULD BE REPORTED IMMEDIATELY:  *FEVER GREATER THAN 100.5 F  *CHILLS WITH OR WITHOUT FEVER  NAUSEA AND VOMITING THAT IS NOT CONTROLLED WITH YOUR NAUSEA MEDICATION  *UNUSUAL SHORTNESS OF BREATH  *UNUSUAL BRUISING OR BLEEDING  TENDERNESS IN MOUTH AND THROAT WITH OR WITHOUT PRESENCE OF ULCERS  *URINARY PROBLEMS  *BOWEL PROBLEMS  UNUSUAL RASH Items with * indicate a potential emergency and should be followed up as soon as possible.  Feel free to call the clinic you have any questions or concerns. The clinic phone number is [phone].    

## 2012-11-26 NOTE — Telephone Encounter (Signed)
Gave pt appt for lab , MD and chemo for July  and August 2014 °

## 2012-11-26 NOTE — Progress Notes (Signed)
[facility] [phone]   [phone]  OFFICE PROGRESS NOTE  [name],[name], MD [address] [place] [address]  Principle Diagnosis: Recurrent non-small cell lung cancer, initially diagnosed a stage IB (T2, N0, M0) adenocarcinoma in June 2008 with a tumor size of 8 cm.   Prior Therapy:  #1 status post left upper lobectomy with lymph node dissection under the care of Dr. Edwyna Shell on 06/29/2006.  #2 status post 4 cycles of adjuvant chemotherapy with cisplatin and Taxotere last dose given 08/01/2006.  #3 status post 6 cycles of systemic chemotherapy with carboplatin and Alimta for disease recurrence last dose given 12/12/2009.   Current therapy: Maintenance chemotherapy with Alimta at 500 mg per meter squared given every 3 weeks status post 37 cycles.  INTERVAL HISTORY: [name] 79 y.o. female returns to the clinic today for followup visit. The patient is feeling fine today with no specific complaints. She denied having any significant chest pain, shortness of breath, cough or hemoptysis. She denied having any weight loss or night sweats. The patient denied having any significant nausea or vomiting. She has no fever or chills. She is tolerating her treatment with maintenance Alimta fairly well with no significant adverse effects. The patient had repeat CT scan of the chest performed recently and she is here for evaluation and discussion of her scan results.  MEDICAL HISTORY: Past Medical History  Diagnosis Date  . lung ca dx'd 06/2006    rt and lt lung  . History of migraine headaches   . Hypercholesterolemia   . Lung cancer     ALLERGIES:  is allergic to ondansetron hcl; simvastatin; and iodinated diagnostic agents.  MEDICATIONS:  Current Outpatient Prescriptions  Medication Sig Dispense Refill  . Calcium Carbonate-Vit D-Min (CALTRATE PLUS PO) Take 500 mg by mouth 2 (two) times daily.        . folic acid (FOLVITE) 1 MG tablet  Take 1 mg by mouth daily.        . Multiple Vitamins-Minerals (CENTRUM SILVER PO) Take 1 tablet by mouth daily.       . ranitidine (ZANTAC) 75 MG tablet Take 75 mg by mouth. Pt takes as needed      . vitamin B-12 (CYANOCOBALAMIN) 500 MCG tablet Take 500 mcg by mouth daily.      . prochlorperazine (COMPAZINE) 10 MG tablet Take 10 mg by mouth every 6 (six) hours as needed.         No current facility-administered medications for this visit.    REVIEW OF SYSTEMS:  A comprehensive review of systems was negative.   PHYSICAL EXAMINATION: General appearance: alert, cooperative and no distress Head: Normocephalic, without obvious abnormality, atraumatic Neck: no adenopathy Lymph nodes: Cervical, supraclavicular, and axillary nodes normal. Resp: clear to auscultation bilaterally Cardio: regular rate and rhythm, S1, S2 normal, no murmur, click, rub or gallop GI: soft, non-tender; bowel sounds normal; no masses,  no organomegaly Extremities: extremities normal, atraumatic, no cyanosis or edema Neurologic: Alert and oriented X 3, normal strength and tone. Normal symmetric reflexes. Normal coordination and gait  ECOG PERFORMANCE STATUS: 0 - Asymptomatic  Blood pressure 139/66, pulse 63, resp. rate 18, height 5\' 2"  (1.575 m), weight 150 lb 3.2 oz (68.13 kg).  LABORATORY DATA: Lab Results  Component Value Date   WBC 3.3* 11/26/2012   HGB 12.8 11/26/2012   HCT 37.9 11/26/2012   MCV 97.9 11/26/2012   PLT 190 11/26/2012  Chemistry      Component Value Date/Time   NA 139 11/04/2012 0947   NA 139 01/24/2012 1008   NA 140 08/21/2011 1306   K 3.8 11/04/2012 0947   K 4.2 01/24/2012 1008   K 4.1 08/21/2011 1306   CL 108* 11/04/2012 0947   CL 103 01/24/2012 1008   CL 107 08/21/2011 1306   CO2 23 11/04/2012 0947   CO2 28 01/24/2012 1008   CO2 22 08/21/2011 1306   BUN 16.3 11/04/2012 0947   BUN 13 01/24/2012 1008   BUN 11 08/21/2011 1306   CREATININE 0.7 11/04/2012 0947   CREATININE 0.8 01/24/2012 1008    CREATININE 0.81 08/21/2011 1306      Component Value Date/Time   CALCIUM 8.5 11/04/2012 0947   CALCIUM 8.6 01/24/2012 1008   CALCIUM 8.8 08/21/2011 1306   ALKPHOS 78 11/04/2012 0947   ALKPHOS 79 01/24/2012 1008   ALKPHOS 87 08/21/2011 1306   AST 20 11/04/2012 0947   AST 24 01/24/2012 1008   AST 17 08/21/2011 1306   ALT 12 11/04/2012 0947   ALT 8 08/21/2011 1306   BILITOT 0.49 11/04/2012 0947   BILITOT 0.90 01/24/2012 1008   BILITOT 0.6 08/21/2011 1306       RADIOGRAPHIC STUDIES: Ct Chest Wo Contrast  11/25/2012   *RADIOLOGY REPORT*  Clinical Data: Lung cancer follow-up  CT CHEST WITHOUT CONTRAST  Technique:  Multidetector CT imaging of the chest was performed following the standard protocol without IV contrast.  Comparison: 08/31/2012  Findings: No pleural effusion identified.  The index lesion within the right upper lobe measures 2.2 x 2.5 cm, image 25/series 5.  This is compared with 1.9 x 2.3 cm previously. The adjacent smaller lesion measures 1 cm, image 27/series 5.  This is compared with 9 mm previously.  Index lesion within the right middle lobe measures 1 cm, image 41/series five.  This is unchanged from previous exam.  Subpleural nodule within the right lower lobe is stable measuring 4 mm, image 31/series 5.  In the left upper lobe there is a subpleural nodule which is stable measuring 4 mm, image 21/series 5.  The heart size is normal.  No pericardial effusion.  Calcifications involving the LAD coronary artery again noted.  There is no hilar or mediastinal adenopathy noted.  There is no axillary or supraclavicular adenopathy identified.  Limited imaging through the upper abdomen shows no acute findings. Review of the visualized osseous structures shows no aggressive lytic or sclerotic bone lesions.  IMPRESSION:  1.  No acute findings within the chest. 2.    The dominant lesion in the right upper lobe is minimally increased in size in the interval.  No significant change in the other bilateral pulmonary  nodules  3.  Coronary artery calcifications.   Original Report Authenticated By: Signa Kell, M.D.    ASSESSMENT AND PLAN: This is a very pleasant 79 years old white female with recurrent non-small cell lung cancer, adenocarcinoma currently on maintenance chemotherapy with single agent Alimta status post 37 cycles. She is tolerating her treatment fairly well with no significant adverse effects. I discussed the scan results and showed the images to the patient. I did not see any significant difference in the size of her tumor compared to the previous scan. I recommended for the patient to continue on her maintenance chemotherapy with single agent Alimta as scheduled. She will receive cycle #38 today. She would come back for followup visit in 3 weeks with the next  cycle of her systemic chemotherapy. The patient was advised to call immediately if she has any concerning symptoms in the interval.  All questions were answered. The patient knows to call the clinic with any problems, questions or concerns. We can certainly see the patient much sooner if necessary.  I spent 15 minutes counseling the patient face to face. The total time spent in the appointment was 25 minutes.

## 2012-11-27 ENCOUNTER — Telehealth: Payer: Self-pay | Admitting: Medical Oncology

## 2012-11-27 NOTE — Telephone Encounter (Signed)
Message copied by Charma Igo on Fri Nov 27, 2012  8:39 AM ------      Message from: Conni Slipper      Created: Thu Nov 26, 2012  5:00 PM       Abnormal results, please call  and notify patient to eat more potassium rich foods ------

## 2012-11-27 NOTE — Telephone Encounter (Signed)
Called pt to increase potassium rich foods into her diet and mailed a list to her.

## 2012-11-27 NOTE — Patient Instructions (Signed)
Continue maintenance chemotherapy with Alimta. Followup visit in 3 weeks with the next cycle of chemotherapy.

## 2012-11-30 ENCOUNTER — Telehealth: Payer: Self-pay | Admitting: *Deleted

## 2012-11-30 NOTE — Telephone Encounter (Signed)
erroneous

## 2012-11-30 NOTE — Telephone Encounter (Deleted)
Message copied by Caren Griffins on Mon Nov 30, 2012 11:57 AM ------      Message from: Conni Slipper      Created: Thu Nov 26, 2012  5:00 PM       Abnormal results, please call  and notify patient to eat more potassium rich foods ------

## 2012-12-17 ENCOUNTER — Telehealth: Payer: Self-pay | Admitting: *Deleted

## 2012-12-17 ENCOUNTER — Other Ambulatory Visit (HOSPITAL_BASED_OUTPATIENT_CLINIC_OR_DEPARTMENT_OTHER): Payer: Medicare Other | Admitting: Lab

## 2012-12-17 ENCOUNTER — Encounter: Payer: Self-pay | Admitting: Physician Assistant

## 2012-12-17 ENCOUNTER — Ambulatory Visit (HOSPITAL_BASED_OUTPATIENT_CLINIC_OR_DEPARTMENT_OTHER): Payer: Medicare Other

## 2012-12-17 ENCOUNTER — Ambulatory Visit (HOSPITAL_BASED_OUTPATIENT_CLINIC_OR_DEPARTMENT_OTHER): Payer: Medicare Other | Admitting: Physician Assistant

## 2012-12-17 ENCOUNTER — Other Ambulatory Visit: Payer: Medicare Other | Admitting: Lab

## 2012-12-17 DIAGNOSIS — C3410 Malignant neoplasm of upper lobe, unspecified bronchus or lung: Secondary | ICD-10-CM

## 2012-12-17 DIAGNOSIS — C3490 Malignant neoplasm of unspecified part of unspecified bronchus or lung: Secondary | ICD-10-CM

## 2012-12-17 DIAGNOSIS — Z5111 Encounter for antineoplastic chemotherapy: Secondary | ICD-10-CM

## 2012-12-17 DIAGNOSIS — C801 Malignant (primary) neoplasm, unspecified: Secondary | ICD-10-CM

## 2012-12-17 LAB — CBC WITH DIFFERENTIAL/PLATELET
BASO%: 0.3 % (ref 0.0–2.0)
EOS%: 2.4 % (ref 0.0–7.0)
LYMPH%: 31.4 % (ref 14.0–49.7)
MCH: 32.3 pg (ref 25.1–34.0)
MCHC: 32.6 g/dL (ref 31.5–36.0)
MONO#: 0.5 10*3/uL (ref 0.1–0.9)
Platelets: 252 10*3/uL (ref 145–400)
RBC: 3.81 10*6/uL (ref 3.70–5.45)
WBC: 3.4 10*3/uL — ABNORMAL LOW (ref 3.9–10.3)
lymph#: 1.1 10*3/uL (ref 0.9–3.3)
nRBC: 0 % (ref 0–0)

## 2012-12-17 LAB — COMPREHENSIVE METABOLIC PANEL (CC13)
ALT: 12 U/L (ref 0–55)
AST: 22 U/L (ref 5–34)
Alkaline Phosphatase: 75 U/L (ref 40–150)
BUN: 13.7 mg/dL (ref 7.0–26.0)
Calcium: 9.1 mg/dL (ref 8.4–10.4)
Chloride: 108 mEq/L (ref 98–109)
Creatinine: 0.7 mg/dL (ref 0.6–1.1)

## 2012-12-17 MED ORDER — SODIUM CHLORIDE 0.9 % IV SOLN
500.0000 mg/m2 | Freq: Once | INTRAVENOUS | Status: AC
  Administered 2012-12-17: 900 mg via INTRAVENOUS
  Filled 2012-12-17: qty 36

## 2012-12-17 MED ORDER — DEXAMETHASONE SODIUM PHOSPHATE 10 MG/ML IJ SOLN
10.0000 mg | Freq: Once | INTRAMUSCULAR | Status: AC
  Administered 2012-12-17: 10 mg via INTRAVENOUS

## 2012-12-17 MED ORDER — CYANOCOBALAMIN 1000 MCG/ML IJ SOLN
1000.0000 ug | Freq: Once | INTRAMUSCULAR | Status: AC
  Administered 2012-12-17: 1000 ug via INTRAMUSCULAR

## 2012-12-17 MED ORDER — HEPARIN SOD (PORK) LOCK FLUSH 100 UNIT/ML IV SOLN
500.0000 [IU] | Freq: Once | INTRAVENOUS | Status: AC | PRN
  Administered 2012-12-17: 500 [IU]
  Filled 2012-12-17: qty 5

## 2012-12-17 MED ORDER — SODIUM CHLORIDE 0.9 % IV SOLN
Freq: Once | INTRAVENOUS | Status: AC
  Administered 2012-12-17: 11:00:00 via INTRAVENOUS

## 2012-12-17 MED ORDER — SODIUM CHLORIDE 0.9 % IJ SOLN
10.0000 mL | INTRAMUSCULAR | Status: DC | PRN
  Administered 2012-12-17: 10 mL
  Filled 2012-12-17: qty 10

## 2012-12-17 MED ORDER — ONDANSETRON 8 MG/50ML IVPB (CHCC)
8.0000 mg | Freq: Once | INTRAVENOUS | Status: AC
  Administered 2012-12-17: 8 mg via INTRAVENOUS

## 2012-12-17 NOTE — Telephone Encounter (Signed)
Per staff message and POF I have scheduled appts.  JMW  

## 2012-12-17 NOTE — Progress Notes (Addendum)
[facility] [phone]   [phone]) [phone]  SHARED VISIT PROGRESS NOTE  [name],[name], MD [address] [place] [address]  Principle Diagnosis: Recurrent non-small cell lung cancer, initially diagnosed a stage IB (T2, N0, M0) adenocarcinoma in June 2008 with a tumor size of 8 cm.   Prior Therapy:  #1 status post left upper lobectomy with lymph node dissection under the care of Dr. Edwyna Shell on 06/29/2006.  #2 status post 4 cycles of adjuvant chemotherapy with cisplatin and Taxotere last dose given 08/01/2006.  #3 status post 6 cycles of systemic chemotherapy with carboplatin and Alimta for disease recurrence last dose given 12/12/2009.   Current therapy: Maintenance chemotherapy with Alimta at 500 mg per meter squared given every 3 weeks status post 38 cycles.  INTERVAL HISTORY: [name] 79 y.o. female returns to the clinic today for followup visit. The patient is feeling fine today with no specific complaints. She denied having any significant chest pain, shortness of breath, cough or hemoptysis. She denied having any weight loss or night sweats. The patient denied having any significant nausea or vomiting. She has no fever or chills. She is tolerating her treatment with maintenance Alimta fairly well with no significant adverse effects. She reports that since she was told that her potassium has been on the low side, she started taking an over-the-counter potassium supplement. She's unsure of the exact strength. She's also been eating bananas and tomatoes. She will call our office with the specific strength of her over-the-counter potassium supplement.Marland Kitchen  MEDICAL HISTORY: Past Medical History  Diagnosis Date  . lung ca dx'd 06/2006    rt and lt lung  . History of migraine headaches   . Hypercholesterolemia   . Lung cancer     ALLERGIES:  is allergic to ondansetron hcl; simvastatin; and iodinated diagnostic agents.  MEDICATIONS:  Current  Outpatient Prescriptions  Medication Sig Dispense Refill  . Calcium Carbonate-Vit D-Min (CALTRATE PLUS PO) Take 500 mg by mouth 2 (two) times daily.        . folic acid (FOLVITE) 1 MG tablet Take 1 mg by mouth daily.        . Multiple Vitamins-Minerals (CENTRUM SILVER PO) Take 1 tablet by mouth daily.       . prochlorperazine (COMPAZINE) 10 MG tablet Take 10 mg by mouth every 6 (six) hours as needed.        . ranitidine (ZANTAC) 75 MG tablet Take 75 mg by mouth. Pt takes as needed      . vitamin B-12 (CYANOCOBALAMIN) 500 MCG tablet Take 500 mcg by mouth daily.       No current facility-administered medications for this visit.    REVIEW OF SYSTEMS:  A comprehensive review of systems was negative.   PHYSICAL EXAMINATION: General appearance: alert, cooperative and no distress Head: Normocephalic, without obvious abnormality, atraumatic Neck: no adenopathy Lymph nodes: Cervical, supraclavicular, and axillary nodes normal. Resp: clear to auscultation bilaterally Cardio: regular rate and rhythm, S1, S2 normal, no murmur, click, rub or gallop GI: soft, non-tender; bowel sounds normal; no masses,  no organomegaly Extremities: extremities normal, atraumatic, no cyanosis or edema Neurologic: Alert and oriented X 3, normal strength and tone. Normal symmetric reflexes. Normal coordination and gait  ECOG PERFORMANCE STATUS: 0 - Asymptomatic  Blood pressure 124/71, pulse 70, temperature 97.3 F (36.3 C), temperature source Oral, resp. rate 20, height 5\' 2"  (1.575 m), weight 151 lb 8 oz (68.72 kg).  LABORATORY DATA:  Lab Results  Component Value Date   WBC 3.4* 12/17/2012   HGB 12.3 12/17/2012   HCT 37.7 12/17/2012   MCV 99.0 12/17/2012   PLT 252 12/17/2012      Chemistry      Component Value Date/Time   NA 141 12/17/2012 0922   NA 139 01/24/2012 1008   NA 140 08/21/2011 1306   K 4.2 12/17/2012 0922   K 4.2 01/24/2012 1008   K 4.1 08/21/2011 1306   CL 108* 11/04/2012 0947   CL 103 01/24/2012 1008     CL 107 08/21/2011 1306   CO2 23 12/17/2012 0922   CO2 28 01/24/2012 1008   CO2 22 08/21/2011 1306   BUN 13.7 12/17/2012 0922   BUN 13 01/24/2012 1008   BUN 11 08/21/2011 1306   CREATININE 0.7 12/17/2012 0922   CREATININE 0.8 01/24/2012 1008   CREATININE 0.81 08/21/2011 1306      Component Value Date/Time   CALCIUM 9.1 12/17/2012 0922   CALCIUM 8.6 01/24/2012 1008   CALCIUM 8.8 08/21/2011 1306   ALKPHOS 75 12/17/2012 0922   ALKPHOS 79 01/24/2012 1008   ALKPHOS 87 08/21/2011 1306   AST 22 12/17/2012 0922   AST 24 01/24/2012 1008   AST 17 08/21/2011 1306   ALT 12 12/17/2012 0922   ALT 8 08/21/2011 1306   BILITOT 0.66 12/17/2012 0922   BILITOT 0.90 01/24/2012 1008   BILITOT 0.6 08/21/2011 1306       RADIOGRAPHIC STUDIES: Ct Chest Wo Contrast  11/25/2012   *RADIOLOGY REPORT*  Clinical Data: Lung cancer follow-up  CT CHEST WITHOUT CONTRAST  Technique:  Multidetector CT imaging of the chest was performed following the standard protocol without IV contrast.  Comparison: 08/31/2012  Findings: No pleural effusion identified.  The index lesion within the right upper lobe measures 2.2 x 2.5 cm, image 25/series 5.  This is compared with 1.9 x 2.3 cm previously. The adjacent smaller lesion measures 1 cm, image 27/series 5.  This is compared with 9 mm previously.  Index lesion within the right middle lobe measures 1 cm, image 41/series five.  This is unchanged from previous exam.  Subpleural nodule within the right lower lobe is stable measuring 4 mm, image 31/series 5.  In the left upper lobe there is a subpleural nodule which is stable measuring 4 mm, image 21/series 5.  The heart size is normal.  No pericardial effusion.  Calcifications involving the LAD coronary artery again noted.  There is no hilar or mediastinal adenopathy noted.  There is no axillary or supraclavicular adenopathy identified.  Limited imaging through the upper abdomen shows no acute findings. Review of the visualized osseous structures shows no  aggressive lytic or sclerotic bone lesions.  IMPRESSION:  1.  No acute findings within the chest. 2.    The dominant lesion in the right upper lobe is minimally increased in size in the interval.  No significant change in the other bilateral pulmonary nodules  3.  Coronary artery calcifications.   Original Report Authenticated By: Signa Kell, M.D.    ASSESSMENT AND PLAN: This is a very pleasant 79 years old white female with recurrent non-small cell lung cancer, adenocarcinoma currently on maintenance chemotherapy with single agent Alimta status post 38 cycles. She is tolerating her treatment fairly well with no significant adverse effects. Patient was discussed with also seen by Dr. Arbutus Ped. She will receive cycle #39 today. She would come back for followup visit in 3 weeks with the next cycle  of her systemic chemotherapy. The patient was advised to call immediately if she has any concerning symptoms in the interval.  Laural Benes, [name] Cheetham E, [name]   All questions were answered. The patient knows to call the clinic with any problems, questions or concerns. We can certainly see the patient much sooner if necessary.  I spent 15 minutes counseling the patient face to face. The total time spent in the appointment was 25 minutes.  ADDENDUM: Hematology/Oncology Attending:  I have a face to face encounter with the patient today. I recommended her care plan. The patient is feeling fine today with no specific complaints and is tolerating her maintenance chemotherapy fairly well with no significant adverse effects. I recommend for the patient to continue her treatment today as scheduled. She would come back for follow up visit in 3 weeks with the next cycle of her treatment. Lajuana Matte., MD 12/17/2012

## 2012-12-17 NOTE — Patient Instructions (Addendum)
Follow up in 3 weeks, prior to your next scheduled cycle of chemotherapy 

## 2012-12-17 NOTE — Patient Instructions (Signed)
[facility] Center Discharge Instructions for Patients Receiving Chemotherapy  Today you received the following chemotherapy agents: alimta  To help prevent nausea and vomiting after your treatment, we encourage you to take your nausea medication.  Take it as often as prescribed.     If you develop nausea and vomiting that is not controlled by your nausea medication, call the clinic. If it is after clinic hours your family physician or the after hours number for the clinic or go to the Emergency Department.   BELOW ARE SYMPTOMS THAT SHOULD BE REPORTED IMMEDIATELY:  *FEVER GREATER THAN 100.5 F  *CHILLS WITH OR WITHOUT FEVER  NAUSEA AND VOMITING THAT IS NOT CONTROLLED WITH YOUR NAUSEA MEDICATION  *UNUSUAL SHORTNESS OF BREATH  *UNUSUAL BRUISING OR BLEEDING  TENDERNESS IN MOUTH AND THROAT WITH OR WITHOUT PRESENCE OF ULCERS  *URINARY PROBLEMS  *BOWEL PROBLEMS  UNUSUAL RASH Items with * indicate a potential emergency and should be followed up as soon as possible.  Feel free to call the clinic you have any questions or concerns. The clinic phone number is [phone].   I have been informed and understand all the instructions given to me. I know to contact the clinic, my physician, or go to the Emergency Department if any problems should occur. I do not have any questions at this time, but understand that I may call the clinic during office hours   should I have any questions or need assistance in obtaining follow up care.    __________________________________________  _____________  __________ Signature of Patient or Authorized Representative            Date                   Time    __________________________________________ Nurse's Signature    

## 2012-12-18 ENCOUNTER — Telehealth: Payer: Self-pay | Admitting: Internal Medicine

## 2012-12-18 NOTE — Telephone Encounter (Signed)
appts complete. S/w pt and confirmed appt for 8/7. Pt will get schedule when she comes in.

## 2012-12-24 ENCOUNTER — Telehealth: Payer: Self-pay | Admitting: Internal Medicine

## 2012-12-24 NOTE — Telephone Encounter (Signed)
Talked to pt and she is aware of appt on August 2014 lab, MD and chemo

## 2013-01-07 ENCOUNTER — Ambulatory Visit (HOSPITAL_BASED_OUTPATIENT_CLINIC_OR_DEPARTMENT_OTHER): Payer: Medicare Other | Admitting: Internal Medicine

## 2013-01-07 ENCOUNTER — Ambulatory Visit: Payer: Medicare Other

## 2013-01-07 ENCOUNTER — Telehealth: Payer: Self-pay | Admitting: Internal Medicine

## 2013-01-07 ENCOUNTER — Other Ambulatory Visit (HOSPITAL_BASED_OUTPATIENT_CLINIC_OR_DEPARTMENT_OTHER): Payer: Medicare Other | Admitting: Lab

## 2013-01-07 ENCOUNTER — Ambulatory Visit (HOSPITAL_BASED_OUTPATIENT_CLINIC_OR_DEPARTMENT_OTHER): Payer: Medicare Other

## 2013-01-07 ENCOUNTER — Encounter: Payer: Self-pay | Admitting: Internal Medicine

## 2013-01-07 DIAGNOSIS — C3410 Malignant neoplasm of upper lobe, unspecified bronchus or lung: Secondary | ICD-10-CM

## 2013-01-07 DIAGNOSIS — C3490 Malignant neoplasm of unspecified part of unspecified bronchus or lung: Secondary | ICD-10-CM

## 2013-01-07 DIAGNOSIS — C801 Malignant (primary) neoplasm, unspecified: Secondary | ICD-10-CM

## 2013-01-07 DIAGNOSIS — Z5111 Encounter for antineoplastic chemotherapy: Secondary | ICD-10-CM

## 2013-01-07 LAB — CBC WITH DIFFERENTIAL/PLATELET
BASO%: 0.2 % (ref 0.0–2.0)
EOS%: 2 % (ref 0.0–7.0)
MCH: 32.7 pg (ref 25.1–34.0)
MCHC: 33.3 g/dL (ref 31.5–36.0)
MCV: 98.2 fL (ref 79.5–101.0)
MONO%: 13.6 % (ref 0.0–14.0)
RBC: 3.88 10*6/uL (ref 3.70–5.45)
RDW: 14.4 % (ref 11.2–14.5)
lymph#: 1.5 10*3/uL (ref 0.9–3.3)
nRBC: 0 % (ref 0–0)

## 2013-01-07 MED ORDER — SODIUM CHLORIDE 0.9 % IV SOLN: Freq: Once | INTRAVENOUS | Status: DC

## 2013-01-07 MED ORDER — SODIUM CHLORIDE 0.9 % IV SOLN
500.0000 mg/m2 | Freq: Once | INTRAVENOUS | Status: AC
  Administered 2013-01-07: 900 mg via INTRAVENOUS
  Filled 2013-01-07: qty 36

## 2013-01-07 MED ORDER — DEXAMETHASONE SODIUM PHOSPHATE 10 MG/ML IJ SOLN
10.0000 mg | Freq: Once | INTRAMUSCULAR | Status: AC
  Administered 2013-01-07: 10 mg via INTRAVENOUS

## 2013-01-07 MED ORDER — SODIUM CHLORIDE 0.9 % IJ SOLN
10.0000 mL | INTRAMUSCULAR | Status: DC | PRN
  Filled 2013-01-07: qty 10

## 2013-01-07 MED ORDER — HEPARIN SOD (PORK) LOCK FLUSH 100 UNIT/ML IV SOLN
500.0000 [IU] | Freq: Once | INTRAVENOUS | Status: DC | PRN
  Filled 2013-01-07: qty 5

## 2013-01-07 MED ORDER — ONDANSETRON 8 MG/50ML IVPB (CHCC)
8.0000 mg | Freq: Once | INTRAVENOUS | Status: AC
  Administered 2013-01-07: 8 mg via INTRAVENOUS

## 2013-01-07 NOTE — Telephone Encounter (Signed)
gv and printed appt sched and avs for pt....emailed MB to move 8.28.14 tx after MD visit

## 2013-01-07 NOTE — Patient Instructions (Addendum)
[facility] Discharge Instructions for Patients Receiving Chemotherapy  Today you received the following chemotherapy agents Alimta  To help prevent nausea and vomiting after your treatment, we encourage you to take your nausea medication     If you develop nausea and vomiting that is not controlled by your nausea medication, call the clinic.   BELOW ARE SYMPTOMS THAT SHOULD BE REPORTED IMMEDIATELY:  *FEVER GREATER THAN 100.5 F  *CHILLS WITH OR WITHOUT FEVER  NAUSEA AND VOMITING THAT IS NOT CONTROLLED WITH YOUR NAUSEA MEDICATION  *UNUSUAL SHORTNESS OF BREATH  *UNUSUAL BRUISING OR BLEEDING  TENDERNESS IN MOUTH AND THROAT WITH OR WITHOUT PRESENCE OF ULCERS  *URINARY PROBLEMS  *BOWEL PROBLEMS  UNUSUAL RASH Items with * indicate a potential emergency and should be followed up as soon as possible.  Feel free to call the clinic you have any questions or concerns. The clinic phone number is [phone].    

## 2013-01-07 NOTE — Progress Notes (Signed)
[facility] Telephone:([phone]   Fax:(336) [phone]  OFFICE PROGRESS NOTE  [name],[name], MD [address] [place] [address]  Principle Diagnosis and stage: Recurrent non-small cell lung cancer, initially diagnosed a stage IB (T2, N0, M0) adenocarcinoma in June 2008 with a tumor size of 8 cm.   Prior Therapy:  #1 status post left upper lobectomy with lymph node dissection under the care of Dr. Edwyna Shell on 06/29/2006.  #2 status post 4 cycles of adjuvant chemotherapy with cisplatin and Taxotere last dose given 08/01/2006.  #3 status post 6 cycles of systemic chemotherapy with carboplatin and Alimta for disease recurrence last dose given 12/12/2009.   Current therapy: Maintenance chemotherapy with Alimta at 500 mg per meter squared given every 3 weeks status post 39 cycles.  CHEMOTHERAPY INTENT: Palliative/maintenance.  CURRENT # OF CHEMOTHERAPY CYCLES: 39  CURRENT ANTIEMETICS: Zofran, dexamethasone and Compazine  CURRENT SMOKING STATUS: Never Smoker.   ORAL CHEMOTHERAPY AND CONSENT: None  CURRENT BISPHOSPHONATES USE: None  PAIN MANAGEMENT: None  NARCOTICS INDUCED CONSTIPATION: None  LIVING WILL AND CODE STATUS: has living will for no CODE BLUE.  INTERVAL HISTORY: [name] 79 y.o. female returns to the clinic today for follow up visit. The patient is feeling fine today with no specific complaints. She is tolerating her maintenance chemotherapy with single agent Alimta fairly well. The patient denied having any significant nausea or vomiting. She has no fever or chills. The she denied having any significant chest pain, shortness of breath, cough or hemoptysis.   MEDICAL HISTORY: Past Medical History  Diagnosis Date  . lung ca dx'd 06/2006    rt and lt lung  . History of migraine headaches   . Hypercholesterolemia   . Lung cancer     ALLERGIES:  is allergic to ondansetron hcl; simvastatin; and iodinated diagnostic  agents.  MEDICATIONS:  Current Outpatient Prescriptions  Medication Sig Dispense Refill  . Calcium Carbonate-Vit D-Min (CALTRATE PLUS PO) Take 500 mg by mouth 2 (two) times daily.        . folic acid (FOLVITE) 1 MG tablet Take 1 mg by mouth daily.        . Multiple Vitamins-Minerals (CENTRUM SILVER PO) Take 1 tablet by mouth daily.       . potassium gluconate 595 MG TABS Take 595 mg by mouth daily.      . ranitidine (ZANTAC) 75 MG tablet Take 75 mg by mouth. Pt takes as needed      . vitamin B-12 (CYANOCOBALAMIN) 500 MCG tablet Take 1,500 mcg by mouth daily.       . prochlorperazine (COMPAZINE) 10 MG tablet Take 10 mg by mouth every 6 (six) hours as needed.         No current facility-administered medications for this visit.    REVIEW OF SYSTEMS:  A comprehensive review of systems was negative.   PHYSICAL EXAMINATION: General appearance: alert, cooperative and no distress Head: Normocephalic, without obvious abnormality, atraumatic Neck: no adenopathy Lymph nodes: Cervical, supraclavicular, and axillary nodes normal. Resp: clear to auscultation bilaterally Cardio: regular rate and rhythm, S1, S2 normal, no murmur, click, rub or gallop GI: soft, non-tender; bowel sounds normal; no masses,  no organomegaly Extremities: extremities normal, atraumatic, no cyanosis or edema  ECOG PERFORMANCE STATUS: 0 - Asymptomatic  Blood pressure 127/74, pulse 77, temperature 97.5 F (36.4 C), temperature source Oral, resp. rate 18, height 5\' 2"  (1.575 m), weight 151 lb 6.4 oz (68.675 kg).  LABORATORY  DATA: Lab Results  Component Value Date   WBC 4.1 01/07/2013   HGB 12.7 01/07/2013   HCT 38.1 01/07/2013   MCV 98.2 01/07/2013   PLT 236 01/07/2013      Chemistry      Component Value Date/Time   NA 141 12/17/2012 0922   NA 139 01/24/2012 1008   NA 140 08/21/2011 1306   K 4.2 12/17/2012 0922   K 4.2 01/24/2012 1008   K 4.1 08/21/2011 1306   CL 108* 11/04/2012 0947   CL 103 01/24/2012 1008   CL 107  08/21/2011 1306   CO2 23 12/17/2012 0922   CO2 28 01/24/2012 1008   CO2 22 08/21/2011 1306   BUN 13.7 12/17/2012 0922   BUN 13 01/24/2012 1008   BUN 11 08/21/2011 1306   CREATININE 0.7 12/17/2012 0922   CREATININE 0.8 01/24/2012 1008   CREATININE 0.81 08/21/2011 1306      Component Value Date/Time   CALCIUM 9.1 12/17/2012 0922   CALCIUM 8.6 01/24/2012 1008   CALCIUM 8.8 08/21/2011 1306   ALKPHOS 75 12/17/2012 0922   ALKPHOS 79 01/24/2012 1008   ALKPHOS 87 08/21/2011 1306   AST 22 12/17/2012 0922   AST 24 01/24/2012 1008   AST 17 08/21/2011 1306   ALT 12 12/17/2012 0922   ALT 21 01/24/2012 1008   ALT 8 08/21/2011 1306   BILITOT 0.66 12/17/2012 0922   BILITOT 0.90 01/24/2012 1008   BILITOT 0.6 08/21/2011 1306       RADIOGRAPHIC STUDIES: No results found.  ASSESSMENT AND PLAN: this is a very pleasant 79 years old white female with recurrent non-small cell lung cancer currently on maintenance chemotherapy with single agent Alimta status post 39 cycles. The patient is tolerating her treatment fairly well with no significant adverse effects. She would proceed with cycle #40 today as scheduled. The patient would come back for follow up visit in 3 weeks with the next cycle of her treatment. She was advised to call immediately if she has any concerning symptoms in the interval.  The patient voices understanding of current disease status and treatment options and is in agreement with the current care plan.  All questions were answered. The patient knows to call the clinic with any problems, questions or concerns. We can certainly see the patient much sooner if necessary.

## 2013-01-07 NOTE — Patient Instructions (Addendum)
Continue chemotherapy today as scheduled. Followup Visit in 3 weeks CHEMOTHERAPY INTENT: Palliative/maintenance.  CURRENT # OF CHEMOTHERAPY CYCLES: 39  CURRENT ANTIEMETICS: Zofran, dexamethasone and Compazine  CURRENT SMOKING STATUS: Never Smoker.   ORAL CHEMOTHERAPY AND CONSENT: None  CURRENT BISPHOSPHONATES USE: None  PAIN MANAGEMENT: None  NARCOTICS INDUCED CONSTIPATION: None  LIVING WILL AND CODE STATUS: has living will for no CODE BLUE.

## 2013-01-28 ENCOUNTER — Other Ambulatory Visit (HOSPITAL_BASED_OUTPATIENT_CLINIC_OR_DEPARTMENT_OTHER): Payer: Medicare Other | Admitting: Lab

## 2013-01-28 ENCOUNTER — Other Ambulatory Visit: Payer: Medicare Other | Admitting: Lab

## 2013-01-28 ENCOUNTER — Encounter: Payer: Self-pay | Admitting: Oncology

## 2013-01-28 ENCOUNTER — Telehealth: Payer: Self-pay | Admitting: Internal Medicine

## 2013-01-28 ENCOUNTER — Telehealth: Payer: Self-pay | Admitting: *Deleted

## 2013-01-28 ENCOUNTER — Ambulatory Visit (HOSPITAL_BASED_OUTPATIENT_CLINIC_OR_DEPARTMENT_OTHER): Payer: Medicare Other | Admitting: Physician Assistant

## 2013-01-28 ENCOUNTER — Ambulatory Visit (HOSPITAL_BASED_OUTPATIENT_CLINIC_OR_DEPARTMENT_OTHER): Payer: Medicare Other

## 2013-01-28 DIAGNOSIS — C801 Malignant (primary) neoplasm, unspecified: Secondary | ICD-10-CM

## 2013-01-28 DIAGNOSIS — C3410 Malignant neoplasm of upper lobe, unspecified bronchus or lung: Secondary | ICD-10-CM

## 2013-01-28 DIAGNOSIS — Z5111 Encounter for antineoplastic chemotherapy: Secondary | ICD-10-CM

## 2013-01-28 DIAGNOSIS — C3490 Malignant neoplasm of unspecified part of unspecified bronchus or lung: Secondary | ICD-10-CM

## 2013-01-28 LAB — COMPREHENSIVE METABOLIC PANEL (CC13)
ALT: 10 U/L (ref 0–55)
AST: 19 U/L (ref 5–34)
Albumin: 3.2 g/dL — ABNORMAL LOW (ref 3.5–5.0)
Alkaline Phosphatase: 79 U/L (ref 40–150)
BUN: 18.5 mg/dL (ref 7.0–26.0)
CO2: 21 mEq/L — ABNORMAL LOW (ref 22–29)
Calcium: 8.8 mg/dL (ref 8.4–10.4)
Chloride: 110 mEq/L — ABNORMAL HIGH (ref 98–109)
Creatinine: 0.7 mg/dL (ref 0.6–1.1)
Glucose: 90 mg/dl (ref 70–140)
Potassium: 3.9 mEq/L (ref 3.5–5.1)
Sodium: 140 mEq/L (ref 136–145)
Total Bilirubin: 0.49 mg/dL (ref 0.20–1.20)
Total Protein: 6.5 g/dL (ref 6.4–8.3)

## 2013-01-28 LAB — CBC WITH DIFFERENTIAL/PLATELET
Basophils Absolute: 0 10*3/uL (ref 0.0–0.1)
EOS%: 2.1 % (ref 0.0–7.0)
HCT: 38.5 % (ref 34.8–46.6)
HGB: 12.6 g/dL (ref 11.6–15.9)
MCH: 32.6 pg (ref 25.1–34.0)
MONO#: 0.5 10*3/uL (ref 0.1–0.9)
NEUT%: 48.8 % (ref 38.4–76.8)
lymph#: 1.4 10*3/uL (ref 0.9–3.3)

## 2013-01-28 MED ORDER — ONDANSETRON 8 MG/50ML IVPB (CHCC)
8.0000 mg | Freq: Once | INTRAVENOUS | Status: AC
  Administered 2013-01-28: 8 mg via INTRAVENOUS

## 2013-01-28 MED ORDER — SODIUM CHLORIDE 0.9 % IV SOLN
500.0000 mg/m2 | Freq: Once | INTRAVENOUS | Status: AC
  Administered 2013-01-28: 900 mg via INTRAVENOUS
  Filled 2013-01-28: qty 36

## 2013-01-28 MED ORDER — DEXAMETHASONE SODIUM PHOSPHATE 10 MG/ML IJ SOLN
10.0000 mg | Freq: Once | INTRAMUSCULAR | Status: AC
Start: 2013-01-28 — End: 2013-01-28
  Administered 2013-01-28: 10 mg via INTRAVENOUS

## 2013-01-28 MED ORDER — SODIUM CHLORIDE 0.9 % IV SOLN
Freq: Once | INTRAVENOUS | Status: AC
  Administered 2013-01-28: 10:00:00 via INTRAVENOUS

## 2013-01-28 MED ORDER — SODIUM CHLORIDE 0.9 % IJ SOLN
10.0000 mL | INTRAMUSCULAR | Status: DC | PRN
  Administered 2013-01-28: 10 mL
  Filled 2013-01-28: qty 10

## 2013-01-28 MED ORDER — HEPARIN SOD (PORK) LOCK FLUSH 100 UNIT/ML IV SOLN
500.0000 [IU] | Freq: Once | INTRAVENOUS | Status: AC | PRN
  Administered 2013-01-28: 500 [IU]
  Filled 2013-01-28: qty 5

## 2013-01-28 NOTE — Patient Instructions (Signed)
[facility] Discharge Instructions for Patients Receiving Chemotherapy  Today you received the following chemotherapy agent Alimta.  To help prevent nausea and vomiting after your treatment, we encourage you to take your nausea medication.   If you develop nausea and vomiting that is not controlled by your nausea medication, call the clinic.   BELOW ARE SYMPTOMS THAT SHOULD BE REPORTED IMMEDIATELY:  *FEVER GREATER THAN 100.5 F  *CHILLS WITH OR WITHOUT FEVER  NAUSEA AND VOMITING THAT IS NOT CONTROLLED WITH YOUR NAUSEA MEDICATION  *UNUSUAL SHORTNESS OF BREATH  *UNUSUAL BRUISING OR BLEEDING  TENDERNESS IN MOUTH AND THROAT WITH OR WITHOUT PRESENCE OF ULCERS  *URINARY PROBLEMS  *BOWEL PROBLEMS  UNUSUAL RASH Items with * indicate a potential emergency and should be followed up as soon as possible.  Feel free to call the clinic you have any questions or concerns. The clinic phone number is [phone].   Pemetrexed injection What is this medicine? PEMETREXED (PEM e TREX ed) is a chemotherapy drug. This medicine affects cells that are rapidly growing, such as cancer cells and cells in your mouth and stomach. It is usually used to treat lung cancers like non-small cell lung cancer and mesothelioma. It may also be used to treat other cancers. This medicine may be used for other purposes; ask your health care provider or pharmacist if you have questions. What should I tell my health care provider before I take this medicine? They need to know if you have any of these conditions: -if you frequently drink alcohol containing beverages -infection (especially a virus infection such as chickenpox, cold sores, or herpes) -kidney disease -liver disease -low blood counts, like low platelets, red bloods, or white blood cells -an unusual or allergic reaction to pemetrexed, mannitol, other medicines, foods, dyes, or preservatives -pregnant or trying to get  pregnant -breast-feeding How should I use this medicine? This drug is given as an infusion into a vein. It is administered in a hospital or clinic by a specially trained health care professional. Talk to your pediatrician regarding the use of this medicine in children. Special care may be needed. Overdosage: If you think you have taken too much of this medicine contact a poison control center or emergency room at once. NOTE: This medicine is only for you. Do not share this medicine with others. What if I miss a dose? It is important not to miss your dose. Call your doctor or health care professional if you are unable to keep an appointment. What may interact with this medicine? -aspirin and aspirin-like medicines -medicines to increase blood counts like filgrastim, pegfilgrastim, sargramostim -methotrexate -NSAIDS, medicines for pain and inflammation, like ibuprofen or naproxen -probenecid -pyrimethamine -vaccines Talk to your doctor or health care professional before taking any of these medicines: -acetaminophen -aspirin -ibuprofen -ketoprofen -naproxen This list may not describe all possible interactions. Give your health care provider a list of all the medicines, herbs, non-prescription drugs, or dietary supplements you use. Also tell them if you smoke, drink alcohol, or use illegal drugs. Some items may interact with your medicine. What should I watch for while using this medicine? Visit your doctor for checks on your progress. This drug may make you feel generally unwell. This is not uncommon, as chemotherapy can affect healthy cells as well as cancer cells. Report any side effects. Continue your course of treatment even though you feel ill unless your doctor tells you to stop. In some cases, you may be given additional medicines to help  with side effects. Follow all directions for their use. Call your doctor or health care professional for advice if you get a fever, chills or sore  throat, or other symptoms of a cold or flu. Do not treat yourself. This drug decreases your body's ability to fight infections. Try to avoid being around people who are sick. This medicine may increase your risk to bruise or bleed. Call your doctor or health care professional if you notice any unusual bleeding. Be careful brushing and flossing your teeth or using a toothpick because you may get an infection or bleed more easily. If you have any dental work done, tell your dentist you are receiving this medicine. Avoid taking products that contain aspirin, acetaminophen, ibuprofen, naproxen, or ketoprofen unless instructed by your doctor. These medicines may hide a fever. Call your doctor or health care professional if you get diarrhea or mouth sores. Do not treat yourself. To protect your kidneys, drink water or other fluids as directed while you are taking this medicine. Men and women must use effective birth control while taking this medicine. You may also need to continue using effective birth control for a time after stopping this medicine. Do not become pregnant while taking this medicine. Tell your doctor right away if you think that you or your partner might be pregnant. There is a potential for serious side effects to an unborn child. Talk to your health care professional or pharmacist for more information. Do not breast-feed an infant while taking this medicine. This medicine may lower sperm counts. What side effects may I notice from receiving this medicine? Side effects that you should report to your doctor or health care professional as soon as possible: -allergic reactions like skin rash, itching or hives, swelling of the face, lips, or tongue -low blood counts - this medicine may decrease the number of white blood cells, red blood cells and platelets. You may be at increased risk for infections and bleeding. -signs of infection - fever or chills, cough, sore throat, pain or difficulty  passing urine -signs of decreased platelets or bleeding - bruising, pinpoint red spots on the skin, black, tarry stools, blood in the urine -signs of decreased red blood cells - unusually weak or tired, fainting spells, lightheadedness -breathing problems, like a dry cough -changes in emotions or moods -chest pain -confusion -diarrhea -high blood pressure -mouth or throat sores or ulcers -pain, swelling, warmth in the leg -pain on swallowing -swelling of the ankles, feet, hands -trouble passing urine or change in the amount of urine -vomiting -yellowing of the eyes or skin Side effects that usually do not require medical attention (report to your doctor or health care professional if they continue or are bothersome): -hair loss -loss of appetite -nausea -stomach upset This list may not describe all possible side effects. Call your doctor for medical advice about side effects. You may report side effects to FDA at 1-800-FDA-1088. Where should I keep my medicine? This drug is given in a hospital or clinic and will not be stored at home. NOTE: This sheet is a summary. It may not cover all possible information. If you have questions about this medicine, talk to your doctor, pharmacist, or health care provider.  2013, [employer]. (12/22/2007 1:24:03 PM)

## 2013-01-28 NOTE — Telephone Encounter (Signed)
Per staff message and POF I have scheduled appts.  JMW  

## 2013-01-28 NOTE — Telephone Encounter (Signed)
gv and printed appt sched and avs for pt....MW added tx   °

## 2013-02-01 NOTE — Patient Instructions (Addendum)
Followup with Dr. Arbutus Ped in 3 weeks with a restaging CT scan of the chest to reevaluate your disease

## 2013-02-01 NOTE — Progress Notes (Signed)
[facility] Telephone:([phone]   Fax:(336) [phone]  OFFICE PROGRESS NOTE  [name],[name], MD [address] [place] [address]  Principle Diagnosis and stage: Recurrent non-small cell lung cancer, initially diagnosed a stage IB (T2, N0, M0) adenocarcinoma in June 2008 with a tumor size of 8 cm.   Prior Therapy:  #1 status post left upper lobectomy with lymph node dissection under the care of Dr. Edwyna Shell on 06/29/2006.  #2 status post 4 cycles of adjuvant chemotherapy with cisplatin and Taxotere last dose given 08/01/2006.  #3 status post 6 cycles of systemic chemotherapy with carboplatin and Alimta for disease recurrence last dose given 12/12/2009.   Current therapy: Maintenance chemotherapy with Alimta at 500 mg per meter squared given every 3 weeks status post 40 cycles.  CHEMOTHERAPY INTENT: Palliative/maintenance.  CURRENT # OF CHEMOTHERAPY CYCLES: 41  CURRENT ANTIEMETICS: Zofran, dexamethasone and Compazine  CURRENT SMOKING STATUS: Never Smoker.   ORAL CHEMOTHERAPY AND CONSENT: None  CURRENT BISPHOSPHONATES USE: None  PAIN MANAGEMENT: None  NARCOTICS INDUCED CONSTIPATION: None  LIVING WILL AND CODE STATUS: has living will for no CODE BLUE.  INTERVAL HISTORY: [name] 79 y.o. female returns to the clinic today for follow up visit. The patient is feeling fine today with no specific complaints. She is tolerating her maintenance chemotherapy with single agent Alimta fairly well. The patient denied having any significant nausea or vomiting. She has no fever or chills. The she denied having any significant chest pain, shortness of breath, cough or hemoptysis.   MEDICAL HISTORY: Past Medical History  Diagnosis Date  . lung ca dx'd 06/2006    rt and lt lung  . History of migraine headaches   . Hypercholesterolemia   . Lung cancer     ALLERGIES:  is allergic to simvastatin and iodinated diagnostic agents.  MEDICATIONS:  Current  Outpatient Prescriptions  Medication Sig Dispense Refill  . Calcium Carbonate-Vit D-Min (CALTRATE PLUS PO) Take 500 mg by mouth 2 (two) times daily.        . folic acid (FOLVITE) 1 MG tablet Take 1 mg by mouth daily.        . Multiple Vitamins-Minerals (CENTRUM SILVER PO) Take 1 tablet by mouth daily.       . potassium gluconate 595 MG TABS Take 595 mg by mouth daily.      . prochlorperazine (COMPAZINE) 10 MG tablet Take 10 mg by mouth every 6 (six) hours as needed.        . ranitidine (ZANTAC) 75 MG tablet Take 75 mg by mouth. Pt takes as needed      . vitamin B-12 (CYANOCOBALAMIN) 500 MCG tablet Take 1,500 mcg by mouth daily.        No current facility-administered medications for this visit.    REVIEW OF SYSTEMS:  A comprehensive review of systems was negative.   PHYSICAL EXAMINATION: General appearance: alert, cooperative and no distress Head: Normocephalic, without obvious abnormality, atraumatic Neck: no adenopathy Lymph nodes: Cervical, supraclavicular, and axillary nodes normal. Resp: clear to auscultation bilaterally Cardio: regular rate and rhythm, S1, S2 normal, no murmur, click, rub or gallop GI: soft, non-tender; bowel sounds normal; no masses,  no organomegaly Extremities: extremities normal, atraumatic, no cyanosis or edema  ECOG PERFORMANCE STATUS: 0 - Asymptomatic  Blood pressure 124/62, pulse 68, temperature 98.1 F (36.7 C), temperature source Oral, resp. rate 18, height 5\' 2"  (1.575 m), weight 152 lb (68.947 kg).  LABORATORY DATA: Lab Results  Component Value Date   WBC 3.9 01/28/2013   HGB 12.6 01/28/2013   HCT 38.5 01/28/2013   MCV 99.5 01/28/2013   PLT 235 01/28/2013      Chemistry      Component Value Date/Time   NA 140 01/28/2013 0852   NA 139 01/24/2012 1008   NA 140 08/21/2011 1306   K 3.9 01/28/2013 0852   K 4.2 01/24/2012 1008   K 4.1 08/21/2011 1306   CL 108* 11/04/2012 0947   CL 103 01/24/2012 1008   CL 107 08/21/2011 1306   CO2 21* 01/28/2013 0852    CO2 28 01/24/2012 1008   CO2 22 08/21/2011 1306   BUN 18.5 01/28/2013 0852   BUN 13 01/24/2012 1008   BUN 11 08/21/2011 1306   CREATININE 0.7 01/28/2013 0852   CREATININE 0.8 01/24/2012 1008   CREATININE 0.81 08/21/2011 1306      Component Value Date/Time   CALCIUM 8.8 01/28/2013 0852   CALCIUM 8.6 01/24/2012 1008   CALCIUM 8.8 08/21/2011 1306   ALKPHOS 79 01/28/2013 0852   ALKPHOS 79 01/24/2012 1008   ALKPHOS 87 08/21/2011 1306   AST 19 01/28/2013 0852   AST 24 01/24/2012 1008   AST 17 08/21/2011 1306   ALT 10 01/28/2013 0852   ALT 21 01/24/2012 1008   ALT 8 08/21/2011 1306   BILITOT 0.49 01/28/2013 0852   BILITOT 0.90 01/24/2012 1008   BILITOT 0.6 08/21/2011 1306       RADIOGRAPHIC STUDIES: No results found.  ASSESSMENT AND PLAN: this is a very pleasant 79 years old white female with recurrent non-small cell lung cancer currently on maintenance chemotherapy with single agent Alimta status post 40 cycles. The patient is tolerating her treatment fairly well with no significant adverse effects. She would proceed with cycle #41 today as scheduled. She'll followup with Dr. Arbutus Ped in 3 weeks with restaging CT scan of the chest without contrast to reevaluate her disease, prior to her next cycle of single agent Alimta.  Laural Benes, [name], [name]   She was advised to call immediately if she has any concerning symptoms in the interval.  The patient voices understanding of current disease status and treatment options and is in agreement with the current care plan.  All questions were answered. The patient knows to call the clinic with any problems, questions or concerns. We can certainly see the patient much sooner if necessary.

## 2013-02-15 ENCOUNTER — Ambulatory Visit (HOSPITAL_COMMUNITY)
Admission: RE | Admit: 2013-02-15 | Discharge: 2013-02-15 | Disposition: A | Discharge status: Home / Self Care | Payer: Medicare Other | Source: Ambulatory Visit | Attending: Physician Assistant | Admitting: Physician Assistant

## 2013-02-15 DIAGNOSIS — Z79899 Other long term (current) drug therapy: Secondary | ICD-10-CM | POA: Insufficient documentation

## 2013-02-15 DIAGNOSIS — C3490 Malignant neoplasm of unspecified part of unspecified bronchus or lung: Secondary | ICD-10-CM | POA: Insufficient documentation

## 2013-02-15 DIAGNOSIS — I2510 Atherosclerotic heart disease of native coronary artery without angina pectoris: Secondary | ICD-10-CM | POA: Insufficient documentation

## 2013-02-15 DIAGNOSIS — I700 Atherosclerosis of aorta: Secondary | ICD-10-CM | POA: Insufficient documentation

## 2013-02-18 ENCOUNTER — Other Ambulatory Visit: Payer: Medicare Other | Admitting: Lab

## 2013-02-18 ENCOUNTER — Telehealth: Payer: Self-pay | Admitting: Internal Medicine

## 2013-02-18 ENCOUNTER — Encounter: Payer: Self-pay | Admitting: Physician Assistant

## 2013-02-18 ENCOUNTER — Ambulatory Visit (HOSPITAL_BASED_OUTPATIENT_CLINIC_OR_DEPARTMENT_OTHER): Payer: Medicare Other | Admitting: Physician Assistant

## 2013-02-18 ENCOUNTER — Other Ambulatory Visit (HOSPITAL_BASED_OUTPATIENT_CLINIC_OR_DEPARTMENT_OTHER): Payer: Medicare Other | Admitting: Lab

## 2013-02-18 ENCOUNTER — Ambulatory Visit (HOSPITAL_BASED_OUTPATIENT_CLINIC_OR_DEPARTMENT_OTHER): Payer: Medicare Other

## 2013-02-18 DIAGNOSIS — Z5111 Encounter for antineoplastic chemotherapy: Secondary | ICD-10-CM

## 2013-02-18 DIAGNOSIS — R5381 Other malaise: Secondary | ICD-10-CM

## 2013-02-18 DIAGNOSIS — C3410 Malignant neoplasm of upper lobe, unspecified bronchus or lung: Secondary | ICD-10-CM

## 2013-02-18 DIAGNOSIS — C801 Malignant (primary) neoplasm, unspecified: Secondary | ICD-10-CM

## 2013-02-18 DIAGNOSIS — Z23 Encounter for immunization: Secondary | ICD-10-CM

## 2013-02-18 LAB — CBC WITH DIFFERENTIAL/PLATELET
Basophils Absolute: 0 10*3/uL (ref 0.0–0.1)
Eosinophils Absolute: 0.1 10*3/uL (ref 0.0–0.5)
HCT: 40.1 % (ref 34.8–46.6)
HGB: 13.3 g/dL (ref 11.6–15.9)
LYMPH%: 39.2 % (ref 14.0–49.7)
MONO#: 0.4 10*3/uL (ref 0.1–0.9)
NEUT#: 1.6 10*3/uL (ref 1.5–6.5)
NEUT%: 45.8 % (ref 38.4–76.8)
Platelets: 245 10*3/uL (ref 145–400)
WBC: 3.5 10*3/uL — ABNORMAL LOW (ref 3.9–10.3)
nRBC: 0 % (ref 0–0)

## 2013-02-18 LAB — COMPREHENSIVE METABOLIC PANEL (CC13)
ALT: 11 U/L (ref 0–55)
AST: 21 U/L (ref 5–34)
Alkaline Phosphatase: 73 U/L (ref 40–150)
Sodium: 141 mEq/L (ref 136–145)
Total Bilirubin: 0.52 mg/dL (ref 0.20–1.20)
Total Protein: 6.2 g/dL — ABNORMAL LOW (ref 6.4–8.3)

## 2013-02-18 MED ORDER — DEXAMETHASONE SODIUM PHOSPHATE 10 MG/ML IJ SOLN
INTRAMUSCULAR | Status: AC
  Filled 2013-02-18: qty 1

## 2013-02-18 MED ORDER — DEXAMETHASONE SODIUM PHOSPHATE 10 MG/ML IJ SOLN
10.0000 mg | Freq: Once | INTRAMUSCULAR | Status: AC
  Administered 2013-02-18: 10 mg via INTRAVENOUS

## 2013-02-18 MED ORDER — CYANOCOBALAMIN 1000 MCG/ML IJ SOLN
1000.0000 ug | Freq: Once | INTRAMUSCULAR | Status: AC
  Administered 2013-02-18: 1000 ug via INTRAMUSCULAR

## 2013-02-18 MED ORDER — SODIUM CHLORIDE 0.9 % IV SOLN
500.0000 mg/m2 | Freq: Once | INTRAVENOUS | Status: AC
  Administered 2013-02-18: 900 mg via INTRAVENOUS
  Filled 2013-02-18: qty 36

## 2013-02-18 MED ORDER — CYANOCOBALAMIN 1000 MCG/ML IJ SOLN
INTRAMUSCULAR | Status: AC
  Filled 2013-02-18: qty 1

## 2013-02-18 MED ORDER — HEPARIN SOD (PORK) LOCK FLUSH 100 UNIT/ML IV SOLN
500.0000 [IU] | Freq: Once | INTRAVENOUS | Status: AC | PRN
  Administered 2013-02-18: 500 [IU]
  Filled 2013-02-18: qty 5

## 2013-02-18 MED ORDER — SODIUM CHLORIDE 0.9 % IJ SOLN
10.0000 mL | INTRAMUSCULAR | Status: DC | PRN
  Administered 2013-02-18: 10 mL
  Filled 2013-02-18: qty 10

## 2013-02-18 MED ORDER — SODIUM CHLORIDE 0.9 % IV SOLN
Freq: Once | INTRAVENOUS | Status: AC
  Administered 2013-02-18: 10:00:00 via INTRAVENOUS

## 2013-02-18 MED ORDER — ONDANSETRON 8 MG/NS 50 ML IVPB
INTRAVENOUS | Status: AC
  Filled 2013-02-18: qty 8

## 2013-02-18 MED ORDER — INFLUENZA VAC SPLIT QUAD 0.5 ML IM SUSP
0.5000 mL | Freq: Once | INTRAMUSCULAR | Status: AC
  Administered 2013-02-18: 0.5 mL via INTRAMUSCULAR
  Filled 2013-02-18: qty 0.5

## 2013-02-18 MED ORDER — ONDANSETRON 8 MG/50ML IVPB (CHCC)
8.0000 mg | Freq: Once | INTRAVENOUS | Status: AC
  Administered 2013-02-18: 8 mg via INTRAVENOUS

## 2013-02-18 NOTE — Progress Notes (Addendum)
[facility] [phone]   [phone]  SHARED VISIT PROGRESS NOTE  [name],[name], MD [address] [place] [address]  Principle Diagnosis and stage: Recurrent non-small cell lung cancer, initially diagnosed a stage IB (T2, N0, M0) adenocarcinoma in June 2008 with a tumor size of 8 cm.   Prior Therapy:  #1 status post left upper lobectomy with lymph node dissection under the care of Dr. Edwyna Shell on 06/29/2006.  #2 status post 4 cycles of adjuvant chemotherapy with cisplatin and Taxotere last dose given 08/01/2006.  #3 status post 6 cycles of systemic chemotherapy with carboplatin and Alimta for disease recurrence last dose given 12/12/2009.   Current therapy: Maintenance chemotherapy with Alimta at 500 mg per meter squared given every 3 weeks status post 41 cycles.  CHEMOTHERAPY INTENT: Palliative/maintenance.  CURRENT # OF CHEMOTHERAPY CYCLES: 42  CURRENT ANTIEMETICS: Zofran, dexamethasone and Compazine  CURRENT SMOKING STATUS: Never Smoker.   ORAL CHEMOTHERAPY AND CONSENT: None  CURRENT BISPHOSPHONATES USE: None  PAIN MANAGEMENT: None  NARCOTICS INDUCED CONSTIPATION: None  LIVING WILL AND CODE STATUS: has living will for no CODE BLUE.  INTERVAL HISTORY: [name] 79 y.o. female returns to the clinic today for follow up visit. The patient is feeling fine today with no specific complaints. She is tolerating her maintenance chemotherapy with single agent Alimta fairly well. The patient denied having any significant nausea or vomiting. She has no fever or chills. The she denied having any significant chest pain, shortness of breath, cough or hemoptysis. She does have some mild diarrhea for about a day after chemotherapy as well as some generalized malaise that last for about a week after chemotherapy. She requests a flu shot today. She recently had a restaging CT scan of her chest without contrast and presents to discuss the  results of that study as well as to proceed with her next scheduled cycle of maintenance chemotherapy with single agent Alimta.  MEDICAL HISTORY: Past Medical History  Diagnosis Date  . lung ca dx'd 06/2006    rt and lt lung  . History of migraine headaches   . Hypercholesterolemia   . Lung cancer     ALLERGIES:  is allergic to simvastatin and iodinated diagnostic agents.  MEDICATIONS:  Current Outpatient Prescriptions  Medication Sig Dispense Refill  . Calcium Carbonate-Vit D-Min (CALTRATE PLUS PO) Take 500 mg by mouth 2 (two) times daily.        . folic acid (FOLVITE) 1 MG tablet Take 1 mg by mouth daily.        . Multiple Vitamins-Minerals (CENTRUM SILVER PO) Take 1 tablet by mouth daily.       . potassium gluconate 595 MG TABS Take 595 mg by mouth daily.      . prochlorperazine (COMPAZINE) 10 MG tablet Take 10 mg by mouth every 6 (six) hours as needed.        . ranitidine (ZANTAC) 75 MG tablet Take 75 mg by mouth. Pt takes as needed      . vitamin B-12 (CYANOCOBALAMIN) 500 MCG tablet Take 1,500 mcg by mouth daily.        No current facility-administered medications for this visit.    REVIEW OF SYSTEMS:  A comprehensive review of systems was negative.   PHYSICAL EXAMINATION: General appearance: alert, cooperative and no distress Head: Normocephalic, without obvious abnormality, atraumatic Neck: no adenopathy Lymph nodes: Cervical, supraclavicular, and axillary nodes normal. Resp: clear to auscultation bilaterally Cardio: regular rate  and rhythm, S1, S2 normal, no murmur, click, rub or gallop GI: soft, non-tender; bowel sounds normal; no masses,  no organomegaly Extremities: extremities normal, atraumatic, no cyanosis or edema  ECOG PERFORMANCE STATUS: 0 - Asymptomatic  Blood pressure 124/79, pulse 71, temperature 97 F (36.1 C), temperature source Oral, resp. rate 20, height 5\' 2"  (1.575 m), weight 150 lb 12.8 oz (68.402 kg).  LABORATORY DATA: Lab Results  Component  Value Date   WBC 3.5* 02/18/2013   HGB 13.3 02/18/2013   HCT 40.1 02/18/2013   MCV 98.5 02/18/2013   PLT 245 02/18/2013      Chemistry      Component Value Date/Time   NA 141 02/18/2013 0854   NA 139 01/24/2012 1008   NA 140 08/21/2011 1306   K 4.0 02/18/2013 0854   K 4.2 01/24/2012 1008   K 4.1 08/21/2011 1306   CL 108* 11/04/2012 0947   CL 103 01/24/2012 1008   CL 107 08/21/2011 1306   CO2 20* 02/18/2013 0854   CO2 28 01/24/2012 1008   CO2 22 08/21/2011 1306   BUN 15.6 02/18/2013 0854   BUN 13 01/24/2012 1008   BUN 11 08/21/2011 1306   CREATININE 0.7 02/18/2013 0854   CREATININE 0.8 01/24/2012 1008   CREATININE 0.81 08/21/2011 1306      Component Value Date/Time   CALCIUM 8.6 02/18/2013 0854   CALCIUM 8.6 01/24/2012 1008   CALCIUM 8.8 08/21/2011 1306   ALKPHOS 73 02/18/2013 0854   ALKPHOS 79 01/24/2012 1008   ALKPHOS 87 08/21/2011 1306   AST 21 02/18/2013 0854   AST 24 01/24/2012 1008   AST 17 08/21/2011 1306   ALT 11 02/18/2013 0854   ALT 21 01/24/2012 1008   ALT 8 08/21/2011 1306   BILITOT 0.52 02/18/2013 0854   BILITOT 0.90 01/24/2012 1008   BILITOT 0.6 08/21/2011 1306       RADIOGRAPHIC STUDIES: No results found.  ASSESSMENT AND PLAN: this is a very pleasant 79 years old white female with recurrent non-small cell lung cancer currently on maintenance chemotherapy with single agent Alimta status post 41 cycles. The patient is tolerating her treatment fairly well with no significant adverse effects. Her recent CT scan showed no evidence of any new or progressive disease. She will proceed with cycle #42 today as scheduled. Patient was discussed with her as well as seen by Dr. Arbutus Ped who also reviewed the results of her recent restaging CT scan. She will return in 3 weeks with repeat CBC differential and C. met prior to cycle #43 of her maintenance chemotherapy with single agent Alimta.   Laural Benes, Zanobia Griebel E, [name]   She was advised to call immediately if she has any concerning symptoms in the  interval.  The patient voices understanding of current disease status and treatment options and is in agreement with the current care plan.  All questions were answered. The patient knows to call the clinic with any problems, questions or concerns. We can certainly see the patient much sooner if necessary.  ADDENDUM: Hematology/Oncology Attending: I have a face to face encounter with the patient during the visit. I recommended her care plan. She is a very pleasant 79 years old white female with recurrent non-small cell lung cancer, adenocarcinoma. The patient is currently undergoing maintenance chemotherapy with single agent Alimta status post 41 cycles. She is tolerating her treatment fairly well with no significant adverse effects. The patient had repeat CT scan of the chest performed recently. I discussed the  scan results with the patient and showed the images. It showed no evidence for disease progression. I recommended for the patient to continue her current treatment with maintenance Alimta. She would come back for followup visit in 3 weeks with the next cycle of her treatment. She was advised to call immediately if she has any concerning symptoms in the interval. Lajuana Matte., MD 02/21/2013

## 2013-02-18 NOTE — Patient Instructions (Addendum)
Cancer Center Discharge Instructions for Patients Receiving Chemotherapy  Today you received the following chemotherapy agent Alimta  To help prevent nausea and vomiting after your treatment, we encourage you to take your nausea medication.   If you develop nausea and vomiting that is not controlled by your nausea medication, call the clinic.   BELOW ARE SYMPTOMS THAT SHOULD BE REPORTED IMMEDIATELY:  *FEVER GREATER THAN 100.5 F  *CHILLS WITH OR WITHOUT FEVER  NAUSEA AND VOMITING THAT IS NOT CONTROLLED WITH YOUR NAUSEA MEDICATION  *UNUSUAL SHORTNESS OF BREATH  *UNUSUAL BRUISING OR BLEEDING  TENDERNESS IN MOUTH AND THROAT WITH OR WITHOUT PRESENCE OF ULCERS  *URINARY PROBLEMS  *BOWEL PROBLEMS  UNUSUAL RASH Items with * indicate a potential emergency and should be followed up as soon as possible.  Feel free to call the clinic you have any questions or concerns. The clinic phone number is [phone].   Cyanocobalamin, Vitamin B12 injection What is this medicine? CYANOCOBALAMIN (sye an oh koe BAL a min) is a man made form of vitamin B12. Vitamin B12 is used in the growth of healthy blood cells, nerve cells, and proteins in the body. It also helps with the metabolism of fats and carbohydrates. This medicine is used to treat people who can not absorb vitamin B12. This medicine may be used for other purposes; ask your health care provider or pharmacist if you have questions. What should I tell my health care provider before I take this medicine? They need to know if you have any of these conditions: -kidney disease -Leber's disease -megaloblastic anemia -an unusual or allergic reaction to cyanocobalamin, cobalt, other medicines, foods, dyes, or preservatives -pregnant or trying to get pregnant -breast-feeding How should I use this medicine? This medicine is injected into a muscle or deeply under the skin. It is usually given by a health care professional in a  clinic or doctor's office. However, your doctor may teach you how to inject yourself. Follow all instructions. Talk to your pediatrician regarding the use of this medicine in children. Special care may be needed. Overdosage: If you think you have taken too much of this medicine contact a poison control center or emergency room at once. NOTE: This medicine is only for you. Do not share this medicine with others. What if I miss a dose? If you are given your dose at a clinic or doctor's office, call to reschedule your appointment. If you give your own injections and you miss a dose, take it as soon as you can. If it is almost time for your next dose, take only that dose. Do not take double or extra doses. What may interact with this medicine? -colchicine -heavy alcohol intake This list may not describe all possible interactions. Give your health care provider a list of all the medicines, herbs, non-prescription drugs, or dietary supplements you use. Also tell them if you smoke, drink alcohol, or use illegal drugs. Some items may interact with your medicine. What should I watch for while using this medicine? Visit your doctor or health care professional regularly. You may need blood work done while you are taking this medicine. You may need to follow a special diet. Talk to your doctor. Limit your alcohol intake and avoid smoking to get the best benefit. What side effects may I notice from receiving this medicine? Side effects that you should report to your doctor or health care professional as soon as possible: -allergic reactions like skin rash, itching or hives, swelling  of the face, lips, or tongue -blue tint to skin -chest tightness, pain -difficulty breathing, wheezing -dizziness -red, swollen painful area on the leg Side effects that usually do not require medical attention (report to your doctor or health care professional if they continue or are bothersome): -diarrhea -headache This list  may not describe all possible side effects. Call your doctor for medical advice about side effects. You may report side effects to FDA at 1-800-FDA-1088. Where should I keep my medicine? Keep out of the reach of children. Store at room temperature between 15 and 30 degrees C (59 and 85 degrees F). Protect from light. Throw away any unused medicine after the expiration date. NOTE: This sheet is a summary. It may not cover all possible information. If you have questions about this medicine, talk to your doctor, pharmacist, or health care provider.  2012, Elsevier/Gold Standard. (08/31/2007 10:10:20 PM)  Influenza Vaccine (Flu Vaccine, Inactivated) 2013 2014 What You Need to Know WHY GET VACCINATED?  Influenza ("flu") is a contagious disease that spreads around the [place] every winter, usually between October and May.  Flu is caused by the influenza virus, and can be spread by coughing, sneezing, and close contact.  Anyone can get flu, but the risk of getting flu is highest among children. Symptoms come on suddenly and may last several days. They can include:  Fever or chills.  Sore throat.  Muscle aches.  Fatigue.  Cough.  Headache.  Runny or stuffy nose. Flu can make some people much sicker than others. These people include young children, people 57 and older, pregnant women, and people with certain health conditions such as heart, lung or kidney disease, or a weakened immune system. Flu vaccine is especially important for these people, and anyone in close contact with them. Flu can also lead to pneumonia, and make existing medical conditions worse. It can cause diarrhea and seizures in children. Each year thousands of people in the [place] die from flu, and many more are hospitalized. Flu vaccine is the best protection we have from flu and its complications. Flu vaccine also helps prevent spreading flu from person to person. INACTIVATED FLU VACCINE There are 2 types of  influenza vaccine:  You are getting an inactivated flu vaccine, which does not contain any live influenza virus. It is given by injection with a needle, and often called the "flu shot."  A different live, attenuated (weakened) influenza vaccine is sprayed into the nostrils. This vaccine is described in a separate Vaccine Information Statement. Flu vaccine is recommended every year. Children 6 months through 61 years of age should get 2 doses the first year they get vaccinated. Flu viruses are always changing. Each year's flu vaccine is made to protect from viruses that are most likely to cause disease that year. While flu vaccine cannot prevent all cases of flu, it is our best defense against the disease. Inactivated flu vaccine protects against 3 or 4 different influenza viruses. It takes about 2 weeks for protection to develop after the vaccination, and protection lasts several months to a year. Some illnesses that are not caused by influenza virus are often mistaken for flu. Flu vaccine will not prevent these illnesses. It can only prevent influenza. A "high-dose" flu vaccine is available for people 95 years of age and older. The person giving you the vaccine can tell you more about it. Some inactivated flu vaccine contains a very small amount of a mercury-based preservative called thimerosal. Studies have shown that  thimerosal in vaccines is not harmful, but flu vaccines that do not contain a preservative are available. SOME PEOPLE SHOULD NOT GET THIS VACCINE Tell the person who gives you the vaccine:  If you have any severe (life-threatening) allergies. If you ever had a life-threatening allergic reaction after a dose of flu vaccine, or have a severe allergy to any part of this vaccine, you may be advised not to get a dose. Most, but not all, types of flu vaccine contain a small amount of egg.  If you ever had Guillain Barr Syndrome (a severe paralyzing illness, also called GBS). Some people  with a history of GBS should not get this vaccine. This should be discussed with your doctor.  If you are not feeling well. They might suggest waiting until you feel better. But you should come back. RISKS OF A VACCINE REACTION With a vaccine, like any medicine, there is a chance of side effects. These are usually mild and go away on their own. Serious side effects are also possible, but are very rare. Inactivated flu vaccine does not contain live flu virus, sogetting flu from this vaccine is not possible. Brief fainting spells and related symptoms (such as jerking movements) can happen after any medical procedure, including vaccination. Sitting or lying down for about 15 minutes after a vaccination can help prevent fainting and injuries caused by falls. Tell your doctor if you feel dizzy or lightheaded, or have vision changes or ringing in the ears. Mild problems following inactivated flu vaccine:  Soreness, redness, or swelling where the shot was given.  Hoarseness; sore, red or itchy eyes; or cough.  Fever.  Aches.  Headache.  Itching.  Fatigue. If these problems occur, they usually begin soon after the shot and last 1 or 2 days. Moderate problems following inactivated flu vaccine:  Young children who get inactivated flu vaccine and pneumococcal vaccine (PCV13) at the same time may be at increased risk for seizures caused by fever. Ask your doctor for more information. Tell your doctor if a child who is getting flu vaccine has ever had a seizure. Severe problems following inactivated flu vaccine:  A severe allergic reaction could occur after any vaccine (estimated less than 1 in a million doses).  There is a small possibility that inactivated flu vaccine could be associated with Guillan Barr Syndrome (GBS), no more than 1 or 2 cases per million people vaccinated. This is much lower than the risk of severe complications from flu, which can be prevented by flu vaccine. The safety of  vaccines is always being monitored. For more information, visit: http://floyd.org/ WHAT IF THERE IS A SERIOUS REACTION? What should I look for?  Look for anything that concerns you, such as signs of a severe allergic reaction, very high fever, or behavior changes. Signs of a severe allergic reaction can include hives, swelling of the face and throat, difficulty breathing, a fast heartbeat, dizziness, and weakness. These would start a few minutes to a few hours after the vaccination. What should I do?  If you think it is a severe allergic reaction or other emergency that cannot wait, call [phone] 1 1  or get the person to the nearest hospital. Otherwise, call your doctor.  Afterward, the reaction should be reported to the Vaccine Adverse Event Reporting System (VAERS). Your doctor might file this report, or you can do it yourself through the VAERS website at www.vaers.LAgents.no, or by calling 1-951 408 6270. VAERS is only for reporting reactions. They do not give medical advice.  THE NATIONAL VACCINE INJURY COMPENSATION PROGRAM The National Vaccine Injury Compensation Program (VICP) is a federal program that was created to compensate people who may have been injured by certain vaccines. Persons who believe they may have been injured by a vaccine can learn about the program and about filing a claim by calling 1-343-593-2198 or visiting the VICP website at SpiritualWord.at HOW CAN I LEARN MORE?  Ask your doctor.  Call your local or state health department.  Contact the Centers for Disease Control and Prevention (CDC):  Call 754-833-1295 (1-800-CDC-INFO) or  Visit CDC's website at BiotechRoom.com.cy CDC Inactivated Influenza Vaccine Interim VIS (12/27/11) Document Released: 03/14/2006 Document Revised: 02/12/2012 Document Reviewed: 12/27/2011 [facility] Patient Information 2014 [place], [place].

## 2013-02-18 NOTE — Patient Instructions (Addendum)
Follow up in 3 weeks prior to your next scheduled cycle of chemotherapy 

## 2013-02-18 NOTE — Telephone Encounter (Signed)
gv pt appt schedule for October.  °

## 2013-03-11 ENCOUNTER — Ambulatory Visit (HOSPITAL_BASED_OUTPATIENT_CLINIC_OR_DEPARTMENT_OTHER): Payer: Medicare Other | Admitting: Physician Assistant

## 2013-03-11 ENCOUNTER — Ambulatory Visit (HOSPITAL_BASED_OUTPATIENT_CLINIC_OR_DEPARTMENT_OTHER): Payer: Medicare Other

## 2013-03-11 ENCOUNTER — Encounter (INDEPENDENT_AMBULATORY_CARE_PROVIDER_SITE_OTHER): Payer: Self-pay

## 2013-03-11 ENCOUNTER — Ambulatory Visit: Payer: Medicare Other

## 2013-03-11 ENCOUNTER — Other Ambulatory Visit: Payer: Medicare Other | Admitting: Lab

## 2013-03-11 ENCOUNTER — Other Ambulatory Visit (HOSPITAL_BASED_OUTPATIENT_CLINIC_OR_DEPARTMENT_OTHER): Payer: Medicare Other

## 2013-03-11 ENCOUNTER — Telehealth: Payer: Self-pay | Admitting: *Deleted

## 2013-03-11 ENCOUNTER — Telehealth: Payer: Self-pay | Admitting: Internal Medicine

## 2013-03-11 ENCOUNTER — Encounter: Payer: Self-pay | Admitting: Physician Assistant

## 2013-03-11 DIAGNOSIS — C3410 Malignant neoplasm of upper lobe, unspecified bronchus or lung: Secondary | ICD-10-CM

## 2013-03-11 DIAGNOSIS — C3491 Malignant neoplasm of unspecified part of right bronchus or lung: Secondary | ICD-10-CM

## 2013-03-11 DIAGNOSIS — C801 Malignant (primary) neoplasm, unspecified: Secondary | ICD-10-CM

## 2013-03-11 DIAGNOSIS — Z5111 Encounter for antineoplastic chemotherapy: Secondary | ICD-10-CM

## 2013-03-11 DIAGNOSIS — R5381 Other malaise: Secondary | ICD-10-CM

## 2013-03-11 LAB — CBC WITH DIFFERENTIAL/PLATELET
BASO%: 0.5 % (ref 0.0–2.0)
LYMPH%: 35.1 % (ref 14.0–49.7)
MCHC: 33.8 g/dL (ref 31.5–36.0)
MONO#: 0.5 10*3/uL (ref 0.1–0.9)
Platelets: 243 10*3/uL (ref 145–400)
RBC: 3.99 10*6/uL (ref 3.70–5.45)
WBC: 4.1 10*3/uL (ref 3.9–10.3)
lymph#: 1.4 10*3/uL (ref 0.9–3.3)
nRBC: 0 % (ref 0–0)

## 2013-03-11 LAB — COMPREHENSIVE METABOLIC PANEL (CC13)
ALT: 10 U/L (ref 0–55)
AST: 21 U/L (ref 5–34)
Alkaline Phosphatase: 77 U/L (ref 40–150)
Calcium: 8.9 mg/dL (ref 8.4–10.4)
Chloride: 109 mEq/L (ref 98–109)
Creatinine: 0.7 mg/dL (ref 0.6–1.1)

## 2013-03-11 MED ORDER — SODIUM CHLORIDE 0.9 % IV SOLN
500.0000 mg/m2 | Freq: Once | INTRAVENOUS | Status: AC
  Administered 2013-03-11: 900 mg via INTRAVENOUS
  Filled 2013-03-11: qty 36

## 2013-03-11 MED ORDER — HEPARIN SOD (PORK) LOCK FLUSH 100 UNIT/ML IV SOLN
500.0000 [IU] | Freq: Once | INTRAVENOUS | Status: AC | PRN
  Administered 2013-03-11: 500 [IU]
  Filled 2013-03-11: qty 5

## 2013-03-11 MED ORDER — DEXAMETHASONE SODIUM PHOSPHATE 10 MG/ML IJ SOLN
10.0000 mg | Freq: Once | INTRAMUSCULAR | Status: AC
  Administered 2013-03-11: 10 mg via INTRAVENOUS

## 2013-03-11 MED ORDER — ONDANSETRON 8 MG/NS 50 ML IVPB
INTRAVENOUS | Status: AC
  Filled 2013-03-11: qty 8

## 2013-03-11 MED ORDER — SODIUM CHLORIDE 0.9 % IV SOLN
Freq: Once | INTRAVENOUS | Status: AC
  Administered 2013-03-11: 11:00:00 via INTRAVENOUS

## 2013-03-11 MED ORDER — ONDANSETRON 8 MG/50ML IVPB (CHCC)
8.0000 mg | Freq: Once | INTRAVENOUS | Status: AC
  Administered 2013-03-11: 8 mg via INTRAVENOUS

## 2013-03-11 MED ORDER — SODIUM CHLORIDE 0.9 % IJ SOLN
10.0000 mL | INTRAMUSCULAR | Status: DC | PRN
  Administered 2013-03-11: 10 mL
  Filled 2013-03-11: qty 10

## 2013-03-11 MED ORDER — DEXAMETHASONE SODIUM PHOSPHATE 10 MG/ML IJ SOLN
INTRAMUSCULAR | Status: AC
  Filled 2013-03-11: qty 1

## 2013-03-11 NOTE — Telephone Encounter (Signed)
Gave pt appt for labs and ML for october and November , emailed [facility] regarding chemo for october and November 2014

## 2013-03-11 NOTE — Telephone Encounter (Signed)
Gave pt appt for lab,Md and chemo for October 2014

## 2013-03-11 NOTE — Progress Notes (Addendum)
[facility] Telephone:(336) [phone]   Fax:([phone]  SHARED VISIT PROGRESS NOTE  [name],[name], MD [address] [place] [address]  Principle Diagnosis and stage: Recurrent non-small cell lung cancer, initially diagnosed a stage IB (T2, N0, M0) adenocarcinoma in June 2008 with a tumor size of 8 cm.   Prior Therapy:  #1 status post left upper lobectomy with lymph node dissection under the care of Dr. Edwyna Shell on 06/29/2006.  #2 status post 4 cycles of adjuvant chemotherapy with cisplatin and Taxotere last dose given 08/01/2006.  #3 status post 6 cycles of systemic chemotherapy with carboplatin and Alimta for disease recurrence last dose given 12/12/2009.   Current therapy: Maintenance chemotherapy with Alimta at 500 mg per meter squared given every 3 weeks status post 42 cycles.  CHEMOTHERAPY INTENT: Palliative/maintenance.  CURRENT # OF CHEMOTHERAPY CYCLES: 43  CURRENT ANTIEMETICS: Zofran, dexamethasone and Compazine  CURRENT SMOKING STATUS: Never Smoker.   ORAL CHEMOTHERAPY AND CONSENT: None  CURRENT BISPHOSPHONATES USE: None  PAIN MANAGEMENT: None  NARCOTICS INDUCED CONSTIPATION: None  LIVING WILL AND CODE STATUS: has living will for no CODE BLUE.  INTERVAL HISTORY: [name] 79 y.o. female returns to the clinic today for follow up visit. The patient is feeling fine today with no specific complaints. She is tolerating her maintenance chemotherapy with single agent Alimta fairly well. The patient denied having any significant nausea or vomiting. She has no fever or chills. The she denied having any significant chest pain, shortness of breath, cough or hemoptysis. She reports one 2 days of constipation after the last cycle of chemotherapy but this tends to resolve on its own. She continues to have some generalized malaise that lasted for approximately 5 days or so after chemotherapy. She presents to proceed with cycle #43 of her maintenance  chemotherapy with single agent Alimta at 500 mg per meter square given every 3 weeks  MEDICAL HISTORY: Past Medical History  Diagnosis Date  . lung ca dx'd 06/2006    rt and lt lung  . History of migraine headaches   . Hypercholesterolemia   . Lung cancer     ALLERGIES:  is allergic to simvastatin and iodinated diagnostic agents.  MEDICATIONS:  Current Outpatient Prescriptions  Medication Sig Dispense Refill  . Calcium Carbonate-Vit D-Min (CALTRATE PLUS PO) Take 500 mg by mouth 2 (two) times daily.        . folic acid (FOLVITE) 1 MG tablet Take 1 mg by mouth daily.        . Multiple Vitamins-Minerals (CENTRUM SILVER PO) Take 1 tablet by mouth daily.       . potassium gluconate 595 MG TABS Take 595 mg by mouth daily.      . prochlorperazine (COMPAZINE) 10 MG tablet Take 10 mg by mouth every 6 (six) hours as needed.        . ranitidine (ZANTAC) 75 MG tablet Take 75 mg by mouth. Pt takes as needed      . vitamin B-12 (CYANOCOBALAMIN) 500 MCG tablet Take 1,500 mcg by mouth daily.        No current facility-administered medications for this visit.   Facility-Administered Medications Ordered in Other Visits  Medication Dose Route Frequency Provider Last Rate Last Dose  . sodium chloride 0.9 % injection 10 mL  10 mL Intracatheter PRN Si Gaul, MD   10 mL at 03/11/13 1228    REVIEW OF SYSTEMS:  A comprehensive review of systems was negative except  for: Constitutional: positive for malaise Gastrointestinal: positive for constipation   PHYSICAL EXAMINATION: General appearance: alert, cooperative and no distress Head: Normocephalic, without obvious abnormality, atraumatic Neck: no adenopathy Lymph nodes: Cervical, supraclavicular, and axillary nodes normal. Resp: clear to auscultation bilaterally Cardio: regular rate and rhythm, S1, S2 normal, no murmur, click, rub or gallop GI: soft, non-tender; bowel sounds normal; no masses,  no organomegaly Extremities: extremities normal,  atraumatic, no cyanosis or edema  ECOG PERFORMANCE STATUS: 0 - Asymptomatic  Blood pressure 147/69, pulse 67, temperature 97.7 F (36.5 C), temperature source Oral, resp. rate 20, height 5\' 2"  (1.575 m), weight 151 lb (68.493 kg).  LABORATORY DATA: Lab Results  Component Value Date   WBC 4.1 03/11/2013   HGB 13.3 03/11/2013   HCT 39.4 03/11/2013   MCV 98.7 03/11/2013   PLT 243 03/11/2013      Chemistry      Component Value Date/Time   NA 141 03/11/2013 1005   NA 139 01/24/2012 1008   NA 140 08/21/2011 1306   K 3.7 03/11/2013 1005   K 4.2 01/24/2012 1008   K 4.1 08/21/2011 1306   CL 108* 11/04/2012 0947   CL 103 01/24/2012 1008   CL 107 08/21/2011 1306   CO2 24 03/11/2013 1005   CO2 28 01/24/2012 1008   CO2 22 08/21/2011 1306   BUN 11.4 03/11/2013 1005   BUN 13 01/24/2012 1008   BUN 11 08/21/2011 1306   CREATININE 0.7 03/11/2013 1005   CREATININE 0.8 01/24/2012 1008   CREATININE 0.81 08/21/2011 1306      Component Value Date/Time   CALCIUM 8.9 03/11/2013 1005   CALCIUM 8.6 01/24/2012 1008   CALCIUM 8.8 08/21/2011 1306   ALKPHOS 77 03/11/2013 1005   ALKPHOS 79 01/24/2012 1008   ALKPHOS 87 08/21/2011 1306   AST 21 03/11/2013 1005   AST 24 01/24/2012 1008   AST 17 08/21/2011 1306   ALT 10 03/11/2013 1005   ALT 21 01/24/2012 1008   ALT 8 08/21/2011 1306   BILITOT 0.72 03/11/2013 1005   BILITOT 0.90 01/24/2012 1008   BILITOT 0.6 08/21/2011 1306       RADIOGRAPHIC STUDIES: No results found.  ASSESSMENT AND PLAN: this is a very pleasant 79 years old white female with recurrent non-small cell lung cancer currently on maintenance chemotherapy with single agent Alimta status post 42 cycles. The patient is tolerating her treatment fairly well with no significant adverse effects. Her recent CT scan showed no evidence of any new or progressive disease. She will proceed with cycle #43 today as scheduled. Patient was discussed with her as well as seen by Dr. Arbutus Ped. She will return in 3 weeks with repeat  CBC differential and C. met prior to cycle #44 of her maintenance chemotherapy with single agent Alimta.   Laural Benes, Chika Cichowski E, [name]   She was advised to call immediately if she has any concerning symptoms in the interval.  The patient voices understanding of current disease status and treatment options and is in agreement with the current care plan.  All questions were answered. The patient knows to call the clinic with any problems, questions or concerns. We can certainly see the patient much sooner if necessary.   ADDENDUM: Hematology/Oncology Attending: I had face to face encounter with the patient. I recommended her care plan. The patient is doing fine today with no specific complaints. She is tolerating her current maintenance treatment with single agent Alimta fairly well. She denied having any significant nausea  or vomiting. She has no chest pain, shortness of breath, cough or hemoptysis. We will continue with her treatment today as planned. She would come back for followup visit in 3 weeks with the next cycle of her chemotherapy. She was advised to call immediately if she has any concerning symptoms in the interval. Lajuana Matte., MD 03/13/2013

## 2013-03-11 NOTE — Patient Instructions (Signed)
[facility] Discharge Instructions for Patients Receiving Chemotherapy  Today you received the following chemotherapy agent: Alimta   To help prevent nausea and vomiting after your treatment, we encourage you to take your nausea medication as prescribed.    If you develop nausea and vomiting that is not controlled by your nausea medication, call the clinic.   BELOW ARE SYMPTOMS THAT SHOULD BE REPORTED IMMEDIATELY:  *FEVER GREATER THAN 100.5 F  *CHILLS WITH OR WITHOUT FEVER  NAUSEA AND VOMITING THAT IS NOT CONTROLLED WITH YOUR NAUSEA MEDICATION  *UNUSUAL SHORTNESS OF BREATH  *UNUSUAL BRUISING OR BLEEDING  TENDERNESS IN MOUTH AND THROAT WITH OR WITHOUT PRESENCE OF ULCERS  *URINARY PROBLEMS  *BOWEL PROBLEMS  UNUSUAL RASH Items with * indicate a potential emergency and should be followed up as soon as possible.  Feel free to call the clinic you have any questions or concerns. The clinic phone number is [phone].    

## 2013-03-11 NOTE — Patient Instructions (Signed)
Follow up in 3 weeks, prior to your next scheduled cycle of maintenance chemotherapy 

## 2013-03-11 NOTE — Telephone Encounter (Signed)
Per staff message and POF I have scheduled appts.  JMW  

## 2013-04-01 ENCOUNTER — Ambulatory Visit: Payer: Medicare Other

## 2013-04-01 ENCOUNTER — Telehealth: Payer: Self-pay | Admitting: *Deleted

## 2013-04-01 ENCOUNTER — Ambulatory Visit (HOSPITAL_BASED_OUTPATIENT_CLINIC_OR_DEPARTMENT_OTHER): Payer: Medicare Other | Admitting: Physician Assistant

## 2013-04-01 ENCOUNTER — Ambulatory Visit (HOSPITAL_BASED_OUTPATIENT_CLINIC_OR_DEPARTMENT_OTHER): Payer: Medicare Other

## 2013-04-01 ENCOUNTER — Encounter (INDEPENDENT_AMBULATORY_CARE_PROVIDER_SITE_OTHER): Payer: Self-pay

## 2013-04-01 ENCOUNTER — Other Ambulatory Visit (HOSPITAL_BASED_OUTPATIENT_CLINIC_OR_DEPARTMENT_OTHER): Payer: Medicare Other | Admitting: Lab

## 2013-04-01 DIAGNOSIS — C801 Malignant (primary) neoplasm, unspecified: Secondary | ICD-10-CM

## 2013-04-01 DIAGNOSIS — C3430 Malignant neoplasm of lower lobe, unspecified bronchus or lung: Secondary | ICD-10-CM

## 2013-04-01 DIAGNOSIS — C3410 Malignant neoplasm of upper lobe, unspecified bronchus or lung: Secondary | ICD-10-CM

## 2013-04-01 DIAGNOSIS — C3491 Malignant neoplasm of unspecified part of right bronchus or lung: Secondary | ICD-10-CM

## 2013-04-01 DIAGNOSIS — Z5111 Encounter for antineoplastic chemotherapy: Secondary | ICD-10-CM

## 2013-04-01 LAB — CBC WITH DIFFERENTIAL/PLATELET
BASO%: 0.9 % (ref 0.0–2.0)
EOS%: 1.5 % (ref 0.0–7.0)
HCT: 39.8 % (ref 34.8–46.6)
LYMPH%: 42.6 % (ref 14.0–49.7)
MCH: 33 pg (ref 25.1–34.0)
MCHC: 33.4 g/dL (ref 31.5–36.0)
MCV: 98.8 fL (ref 79.5–101.0)
MONO%: 14.8 % — ABNORMAL HIGH (ref 0.0–14.0)
NEUT%: 40.2 % (ref 38.4–76.8)
Platelets: 247 10*3/uL (ref 145–400)
lymph#: 1.4 10*3/uL (ref 0.9–3.3)
nRBC: 0 % (ref 0–0)

## 2013-04-01 LAB — COMPREHENSIVE METABOLIC PANEL (CC13)
ALT: 10 U/L (ref 0–55)
AST: 21 U/L (ref 5–34)
Albumin: 3.3 g/dL — ABNORMAL LOW (ref 3.5–5.0)
Alkaline Phosphatase: 80 U/L (ref 40–150)
BUN: 10.8 mg/dL (ref 7.0–26.0)
Creatinine: 0.7 mg/dL (ref 0.6–1.1)
Potassium: 3.8 mEq/L (ref 3.5–5.1)

## 2013-04-01 MED ORDER — ONDANSETRON 8 MG/50ML IVPB (CHCC)
8.0000 mg | Freq: Once | INTRAVENOUS | Status: AC
  Administered 2013-04-01: 8 mg via INTRAVENOUS

## 2013-04-01 MED ORDER — DEXAMETHASONE SODIUM PHOSPHATE 10 MG/ML IJ SOLN
10.0000 mg | Freq: Once | INTRAMUSCULAR | Status: AC
  Administered 2013-04-01: 10 mg via INTRAVENOUS

## 2013-04-01 MED ORDER — DEXAMETHASONE SODIUM PHOSPHATE 10 MG/ML IJ SOLN
INTRAMUSCULAR | Status: AC
  Filled 2013-04-01: qty 1

## 2013-04-01 MED ORDER — ONDANSETRON 8 MG/NS 50 ML IVPB
INTRAVENOUS | Status: AC
  Filled 2013-04-01: qty 8

## 2013-04-01 MED ORDER — SODIUM CHLORIDE 0.9 % IV SOLN
500.0000 mg/m2 | Freq: Once | INTRAVENOUS | Status: AC
  Administered 2013-04-01: 900 mg via INTRAVENOUS
  Filled 2013-04-01: qty 36

## 2013-04-01 MED ORDER — SODIUM CHLORIDE 0.9 % IV SOLN
Freq: Once | INTRAVENOUS | Status: AC
  Administered 2013-04-01: 11:00:00 via INTRAVENOUS

## 2013-04-01 MED ORDER — SODIUM CHLORIDE 0.9 % IJ SOLN
10.0000 mL | INTRAMUSCULAR | Status: DC | PRN
  Administered 2013-04-01: 10 mL
  Filled 2013-04-01: qty 10

## 2013-04-01 MED ORDER — HEPARIN SOD (PORK) LOCK FLUSH 100 UNIT/ML IV SOLN
500.0000 [IU] | Freq: Once | INTRAVENOUS | Status: AC | PRN
  Administered 2013-04-01: 500 [IU]
  Filled 2013-04-01: qty 5

## 2013-04-01 NOTE — Telephone Encounter (Signed)
appts made and printed. Pt is aware that tx will be added. i emailed MW to add the tx...td 

## 2013-04-01 NOTE — Progress Notes (Signed)
Ok to treat with ANC 1.4 per Tiana Loft, PA. Angelena Form, RN

## 2013-04-01 NOTE — Patient Instructions (Signed)
[facility] Discharge Instructions for Patients Receiving Chemotherapy  Today you received the following chemotherapy agents Alimta  To help prevent nausea and vomiting after your treatment, we encourage you to take your nausea medication as prescribed.   If you develop nausea and vomiting that is not controlled by your nausea medication, call the clinic.   BELOW ARE SYMPTOMS THAT SHOULD BE REPORTED IMMEDIATELY:  *FEVER GREATER THAN 100.5 F  *CHILLS WITH OR WITHOUT FEVER  NAUSEA AND VOMITING THAT IS NOT CONTROLLED WITH YOUR NAUSEA MEDICATION  *UNUSUAL SHORTNESS OF BREATH  *UNUSUAL BRUISING OR BLEEDING  TENDERNESS IN MOUTH AND THROAT WITH OR WITHOUT PRESENCE OF ULCERS  *URINARY PROBLEMS  *BOWEL PROBLEMS  UNUSUAL RASH Items with * indicate a potential emergency and should be followed up as soon as possible.  Feel free to call the clinic you have any questions or concerns. The clinic phone number is [phone].    

## 2013-04-05 NOTE — Progress Notes (Addendum)
[facility] [phone]   [phone]  SHARED VISIT PROGRESS NOTE  [name],[name], MD [address] [place] [address]  Principle Diagnosis and stage: Recurrent non-small cell lung cancer, initially diagnosed a stage IB (T2, N0, M0) adenocarcinoma in June 2008 with a tumor size of 8 cm.   Prior Therapy:  #1 status post left upper lobectomy with lymph node dissection under the care of Dr. Edwyna Shell on 06/29/2006.  #2 status post 4 cycles of adjuvant chemotherapy with cisplatin and Taxotere last dose given 08/01/2006.  #3 status post 6 cycles of systemic chemotherapy with carboplatin and Alimta for disease recurrence last dose given 12/12/2009.   Current therapy: Maintenance chemotherapy with Alimta at 500 mg per meter squared given every 3 weeks status post 43 cycles.  CHEMOTHERAPY INTENT: Palliative/maintenance.  CURRENT # OF CHEMOTHERAPY CYCLES: 44  CURRENT ANTIEMETICS: Zofran, dexamethasone and Compazine  CURRENT SMOKING STATUS: Never Smoker.   ORAL CHEMOTHERAPY AND CONSENT: None  CURRENT BISPHOSPHONATES USE: None  PAIN MANAGEMENT: None  NARCOTICS INDUCED CONSTIPATION: None  LIVING WILL AND CODE STATUS: has living will for no CODE BLUE.  INTERVAL HISTORY: NALEA SALCE 79 y.o. female returns to the clinic today for follow up visit. The patient is feeling fine today with no specific complaints. She is tolerating her maintenance chemotherapy with single agent Alimta fairly well except for generalized malaise that lasted about 6 or 7 days after chemotherapy. The patient denied having any significant nausea or vomiting. She has no fever or chills. The she denied having any significant chest pain, shortness of breath, cough or hemoptysis. She reports one 2 days of constipation after the last cycle of chemotherapy but this tends to resolve on its own. She presents to proceed with cycle #44 of her maintenance chemotherapy with single agent  Alimta at 500 mg per meter square given every 3 weeks  MEDICAL HISTORY: Past Medical History  Diagnosis Date  . lung ca dx'd 06/2006    rt and lt lung  . History of migraine headaches   . Hypercholesterolemia   . Lung cancer     ALLERGIES:  is allergic to simvastatin and iodinated diagnostic agents.  MEDICATIONS:  Current Outpatient Prescriptions  Medication Sig Dispense Refill  . Calcium Carbonate-Vit D-Min (CALTRATE PLUS PO) Take 500 mg by mouth 2 (two) times daily.        . folic acid (FOLVITE) 1 MG tablet Take 1 mg by mouth daily.        . Multiple Vitamins-Minerals (CENTRUM SILVER PO) Take 1 tablet by mouth daily.       . potassium gluconate 595 MG TABS Take 595 mg by mouth daily.      . ranitidine (ZANTAC) 75 MG tablet Take 75 mg by mouth. Pt takes as needed      . vitamin B-12 (CYANOCOBALAMIN) 500 MCG tablet Take 1,500 mcg by mouth daily.       . prochlorperazine (COMPAZINE) 10 MG tablet Take 10 mg by mouth every 6 (six) hours as needed.         No current facility-administered medications for this visit.    REVIEW OF SYSTEMS:  A comprehensive review of systems was negative except for: Constitutional: positive for malaise Gastrointestinal: positive for constipation   PHYSICAL EXAMINATION: General appearance: alert, cooperative and no distress Head: Normocephalic, without obvious abnormality, atraumatic Neck: no adenopathy Lymph nodes: Cervical, supraclavicular, and axillary nodes normal. Resp: clear to auscultation bilaterally Cardio: regular rate and  rhythm, S1, S2 normal, no murmur, click, rub or gallop GI: soft, non-tender; bowel sounds normal; no masses,  no organomegaly Extremities: extremities normal, atraumatic, no cyanosis or edema  ECOG PERFORMANCE STATUS: 0 - Asymptomatic  Blood pressure 137/74, pulse 72, temperature 98.5 F (36.9 C), temperature source Oral, resp. rate 18, height 5\' 2"  (1.575 m), weight 151 lb 9.6 oz (68.765 kg).  LABORATORY DATA: Lab  Results  Component Value Date   WBC 3.4* 04/01/2013   HGB 13.3 04/01/2013   HCT 39.8 04/01/2013   MCV 98.8 04/01/2013   PLT 247 04/01/2013      Chemistry      Component Value Date/Time   NA 142 04/01/2013 0955   NA 139 01/24/2012 1008   NA 140 08/21/2011 1306   K 3.8 04/01/2013 0955   K 4.2 01/24/2012 1008   K 4.1 08/21/2011 1306   CL 108* 11/04/2012 0947   CL 103 01/24/2012 1008   CL 107 08/21/2011 1306   CO2 23 04/01/2013 0955   CO2 28 01/24/2012 1008   CO2 22 08/21/2011 1306   BUN 10.8 04/01/2013 0955   BUN 13 01/24/2012 1008   BUN 11 08/21/2011 1306   CREATININE 0.7 04/01/2013 0955   CREATININE 0.8 01/24/2012 1008   CREATININE 0.81 08/21/2011 1306      Component Value Date/Time   CALCIUM 9.1 04/01/2013 0955   CALCIUM 8.6 01/24/2012 1008   CALCIUM 8.8 08/21/2011 1306   ALKPHOS 80 04/01/2013 0955   ALKPHOS 79 01/24/2012 1008   ALKPHOS 87 08/21/2011 1306   AST 21 04/01/2013 0955   AST 24 01/24/2012 1008   AST 17 08/21/2011 1306   ALT 10 04/01/2013 0955   ALT 21 01/24/2012 1008   ALT 8 08/21/2011 1306   BILITOT 0.74 04/01/2013 0955   BILITOT 0.90 01/24/2012 1008   BILITOT 0.6 08/21/2011 1306       RADIOGRAPHIC STUDIES: No results found.  ASSESSMENT AND PLAN: this is a very pleasant 79 years old white female with recurrent non-small cell lung cancer currently on maintenance chemotherapy with single agent Alimta status post 43 cycles. The patient is tolerating her treatment fairly well with no significant adverse effects. Her recent CT scan showed no evidence of any new or progressive disease. She will proceed with cycle #44 today as scheduled. Patient was discussed with her as well as seen by Dr. Arbutus Ped. She will return in 3 weeks with repeat CBC differential and C. met prior to cycle #45 of her maintenance chemotherapy with single agent Alimta.   Laural Benes, Shaquanda Graves E, [name]   She was advised to call immediately if she has any concerning symptoms in the interval.  The patient voices  understanding of current disease status and treatment options and is in agreement with the current care plan.  All questions were answered. The patient knows to call the clinic with any problems, questions or concerns. We can certainly see the patient much sooner if necessary.  ADDENDUM: Hematology/Oncology Attending: I had a face to face encounter with the patient. I recommended her care plan. This is a very pleasant 79 years old white female with recurrent non-small cell lung cancer, adenocarcinoma. The patient is currently undergoing maintenance chemotherapy with single agent Alimta status post 43 cycles and she is tolerating it fairly well. She denied having any significant chest pain, shortness breath, cough or hemoptysis. The patient denied having any nausea or vomiting. We'll proceed today with cycle #44 as scheduled. She would come back for follow  up visit in 3 weeks with the next cycle of her treatment. She was advised to call immediately if she has any concerning symptoms in the interval. Lajuana Matte., MD 04/05/2013

## 2013-04-05 NOTE — Patient Instructions (Signed)
Follow up in 3 weeks, prior to your next scheduled cycle of maintenance chemotherapy 

## 2013-04-22 ENCOUNTER — Ambulatory Visit (HOSPITAL_BASED_OUTPATIENT_CLINIC_OR_DEPARTMENT_OTHER): Payer: Medicare Other | Admitting: Internal Medicine

## 2013-04-22 ENCOUNTER — Ambulatory Visit (HOSPITAL_BASED_OUTPATIENT_CLINIC_OR_DEPARTMENT_OTHER): Payer: Medicare Other

## 2013-04-22 ENCOUNTER — Telehealth: Payer: Self-pay | Admitting: Internal Medicine

## 2013-04-22 ENCOUNTER — Encounter: Payer: Self-pay | Admitting: Internal Medicine

## 2013-04-22 ENCOUNTER — Other Ambulatory Visit: Payer: Self-pay | Admitting: *Deleted

## 2013-04-22 ENCOUNTER — Other Ambulatory Visit: Payer: Medicare Other | Admitting: Lab

## 2013-04-22 ENCOUNTER — Other Ambulatory Visit (HOSPITAL_BASED_OUTPATIENT_CLINIC_OR_DEPARTMENT_OTHER): Payer: Medicare Other | Admitting: Lab

## 2013-04-22 DIAGNOSIS — Z5111 Encounter for antineoplastic chemotherapy: Secondary | ICD-10-CM

## 2013-04-22 DIAGNOSIS — C3410 Malignant neoplasm of upper lobe, unspecified bronchus or lung: Secondary | ICD-10-CM

## 2013-04-22 DIAGNOSIS — C801 Malignant (primary) neoplasm, unspecified: Secondary | ICD-10-CM

## 2013-04-22 LAB — CBC WITH DIFFERENTIAL/PLATELET
BASO%: 0.9 % (ref 0.0–2.0)
Basophils Absolute: 0 10*3/uL (ref 0.0–0.1)
EOS%: 1.1 % (ref 0.0–7.0)
HCT: 39 % (ref 34.8–46.6)
MCHC: 33 g/dL (ref 31.5–36.0)
MONO#: 0.5 10*3/uL (ref 0.1–0.9)
NEUT#: 1.8 10*3/uL (ref 1.5–6.5)
RBC: 3.85 10*6/uL (ref 3.70–5.45)
RDW: 14.7 % — ABNORMAL HIGH (ref 11.2–14.5)
WBC: 3.7 10*3/uL — ABNORMAL LOW (ref 3.9–10.3)
lymph#: 1.4 10*3/uL (ref 0.9–3.3)

## 2013-04-22 LAB — COMPREHENSIVE METABOLIC PANEL (CC13)
ALT: 12 U/L (ref 0–55)
AST: 23 U/L (ref 5–34)
Alkaline Phosphatase: 82 U/L (ref 40–150)
Anion Gap: 12 mEq/L — ABNORMAL HIGH (ref 3–11)
BUN: 15.9 mg/dL (ref 7.0–26.0)
CO2: 22 mEq/L (ref 22–29)
Calcium: 9.3 mg/dL (ref 8.4–10.4)
Chloride: 109 mEq/L (ref 98–109)
Creatinine: 0.8 mg/dL (ref 0.6–1.1)
Sodium: 143 mEq/L (ref 136–145)
Total Protein: 6.9 g/dL (ref 6.4–8.3)

## 2013-04-22 MED ORDER — SODIUM CHLORIDE 0.9 % IJ SOLN
10.0000 mL | INTRAMUSCULAR | Status: DC | PRN
  Administered 2013-04-22: 10 mL
  Filled 2013-04-22: qty 10

## 2013-04-22 MED ORDER — ONDANSETRON 8 MG/NS 50 ML IVPB
INTRAVENOUS | Status: AC
  Filled 2013-04-22: qty 8

## 2013-04-22 MED ORDER — CYANOCOBALAMIN 1000 MCG/ML IJ SOLN
1000.0000 ug | Freq: Once | INTRAMUSCULAR | Status: AC
  Administered 2013-04-22: 1000 ug via INTRAMUSCULAR

## 2013-04-22 MED ORDER — SODIUM CHLORIDE 0.9 % IV SOLN
Freq: Once | INTRAVENOUS | Status: AC
  Administered 2013-04-22: 12:00:00 via INTRAVENOUS

## 2013-04-22 MED ORDER — DEXAMETHASONE SODIUM PHOSPHATE 10 MG/ML IJ SOLN
10.0000 mg | Freq: Once | INTRAMUSCULAR | Status: AC
  Administered 2013-04-22: 10 mg via INTRAVENOUS

## 2013-04-22 MED ORDER — HEPARIN SOD (PORK) LOCK FLUSH 100 UNIT/ML IV SOLN
500.0000 [IU] | Freq: Once | INTRAVENOUS | Status: AC | PRN
  Administered 2013-04-22: 500 [IU]
  Filled 2013-04-22: qty 5

## 2013-04-22 MED ORDER — DEXAMETHASONE SODIUM PHOSPHATE 10 MG/ML IJ SOLN
INTRAMUSCULAR | Status: AC
  Filled 2013-04-22: qty 1

## 2013-04-22 MED ORDER — CYANOCOBALAMIN 1000 MCG/ML IJ SOLN
INTRAMUSCULAR | Status: AC
  Filled 2013-04-22: qty 1

## 2013-04-22 MED ORDER — SODIUM CHLORIDE 0.9 % IV SOLN
500.0000 mg/m2 | Freq: Once | INTRAVENOUS | Status: AC
  Administered 2013-04-22: 900 mg via INTRAVENOUS
  Filled 2013-04-22: qty 36

## 2013-04-22 MED ORDER — ONDANSETRON 8 MG/50ML IVPB (CHCC)
8.0000 mg | Freq: Once | INTRAVENOUS | Status: AC
  Administered 2013-04-22: 8 mg via INTRAVENOUS

## 2013-04-22 NOTE — Patient Instructions (Signed)
[facility] Discharge Instructions for Patients Receiving Chemotherapy  Today you received the following chemotherapy agents Alimta  To help prevent nausea and vomiting after your treatment, we encourage you to take your nausea medication as prescribed.   If you develop nausea and vomiting that is not controlled by your nausea medication, call the clinic.   BELOW ARE SYMPTOMS THAT SHOULD BE REPORTED IMMEDIATELY:  *FEVER GREATER THAN 100.5 F  *CHILLS WITH OR WITHOUT FEVER  NAUSEA AND VOMITING THAT IS NOT CONTROLLED WITH YOUR NAUSEA MEDICATION  *UNUSUAL SHORTNESS OF BREATH  *UNUSUAL BRUISING OR BLEEDING  TENDERNESS IN MOUTH AND THROAT WITH OR WITHOUT PRESENCE OF ULCERS  *URINARY PROBLEMS  *BOWEL PROBLEMS  UNUSUAL RASH Items with * indicate a potential emergency and should be followed up as soon as possible.  Feel free to call the clinic you have any questions or concerns. The clinic phone number is [phone].    

## 2013-04-22 NOTE — Progress Notes (Signed)
[facility] Telephone:(336) [phone]   Fax:([phone]  SHARED VISIT PROGRESS NOTE  [name],[name], MD [address] [place] [address]  Principle Diagnosis and stage: Recurrent non-small cell lung cancer, initially diagnosed a stage IB (T2, N0, M0) adenocarcinoma in June 2008 with a tumor size of 8 cm.   Prior Therapy:  #1 status post left upper lobectomy with lymph node dissection under the care of Dr. Edwyna Shell on 06/29/2006.  #2 status post 4 cycles of adjuvant chemotherapy with cisplatin and Taxotere last dose given 08/01/2006.  #3 status post 6 cycles of systemic chemotherapy with carboplatin and Alimta for disease recurrence last dose given 12/12/2009.   Current therapy: Maintenance chemotherapy with Alimta at 500 mg per meter squared given every 3 weeks status post 44 cycles.  CHEMOTHERAPY INTENT: Palliative/maintenance.  CURRENT # OF CHEMOTHERAPY CYCLES: 45  CURRENT ANTIEMETICS: Zofran, dexamethasone and Compazine  CURRENT SMOKING STATUS: Never Smoker.   ORAL CHEMOTHERAPY AND CONSENT: None  CURRENT BISPHOSPHONATES USE: None  PAIN MANAGEMENT: None  NARCOTICS INDUCED CONSTIPATION: None  LIVING WILL AND CODE STATUS: has living will for no CODE BLUE.  INTERVAL HISTORY: [name] 79 y.o. female returns to the clinic today for follow up visit. The patient is feeling fine today with no specific complaints. She is tolerating her maintenance chemotherapy with single agent Alimta fairly well except for generalized malaise that lasted about 6 or 7 days after chemotherapy. The patient denied having any significant nausea or vomiting. She has no fever or chills. The she denied having any significant chest pain, shortness of breath, cough or hemoptysis. She has no weight loss or night sweats.  MEDICAL HISTORY: Past Medical History  Diagnosis Date  . lung ca dx'd 06/2006    rt and lt lung  . History of migraine headaches   . Hypercholesterolemia    . Lung cancer     ALLERGIES:  is allergic to simvastatin and iodinated diagnostic agents.  MEDICATIONS:  Current Outpatient Prescriptions  Medication Sig Dispense Refill  . Calcium Carbonate-Vit D-Min (CALTRATE PLUS PO) Take 500 mg by mouth 2 (two) times daily.        . folic acid (FOLVITE) 1 MG tablet Take 1 mg by mouth daily.        . Multiple Vitamins-Minerals (CENTRUM SILVER PO) Take 1 tablet by mouth daily.       . potassium gluconate 595 MG TABS Take 595 mg by mouth daily.      . ranitidine (ZANTAC) 75 MG tablet Take 75 mg by mouth. Pt takes as needed      . vitamin B-12 (CYANOCOBALAMIN) 500 MCG tablet Take 1,500 mcg by mouth daily.       . prochlorperazine (COMPAZINE) 10 MG tablet Take 10 mg by mouth every 6 (six) hours as needed.         No current facility-administered medications for this visit.    REVIEW OF SYSTEMS:  A comprehensive review of systems was negative except for: Constitutional: positive for fatigue   PHYSICAL EXAMINATION: General appearance: alert, cooperative and no distress Head: Normocephalic, without obvious abnormality, atraumatic Neck: no adenopathy Lymph nodes: Cervical, supraclavicular, and axillary nodes normal. Resp: clear to auscultation bilaterally Cardio: regular rate and rhythm, S1, S2 normal, no murmur, click, rub or gallop GI: soft, non-tender; bowel sounds normal; no masses,  no organomegaly Extremities: extremities normal, atraumatic, no cyanosis or edema  ECOG PERFORMANCE STATUS: 0 - Asymptomatic  Blood pressure 132/72, pulse  63, temperature 97.9 F (36.6 C), temperature source Oral, resp. rate 18, height 5\' 2"  (1.575 m), weight 150 lb 11.2 oz (68.357 kg), SpO2 100.00%.  LABORATORY DATA: Lab Results  Component Value Date   WBC 3.7* 04/22/2013   HGB 12.9 04/22/2013   HCT 39.0 04/22/2013   MCV 101.2* 04/22/2013   PLT 219 04/22/2013      Chemistry      Component Value Date/Time   NA 142 04/01/2013 0955   NA 139 01/24/2012 1008    NA 140 08/21/2011 1306   K 3.8 04/01/2013 0955   K 4.2 01/24/2012 1008   K 4.1 08/21/2011 1306   CL 108* 11/04/2012 0947   CL 103 01/24/2012 1008   CL 107 08/21/2011 1306   CO2 23 04/01/2013 0955   CO2 28 01/24/2012 1008   CO2 22 08/21/2011 1306   BUN 10.8 04/01/2013 0955   BUN 13 01/24/2012 1008   BUN 11 08/21/2011 1306   CREATININE 0.7 04/01/2013 0955   CREATININE 0.8 01/24/2012 1008   CREATININE 0.81 08/21/2011 1306      Component Value Date/Time   CALCIUM 9.1 04/01/2013 0955   CALCIUM 8.6 01/24/2012 1008   CALCIUM 8.8 08/21/2011 1306   ALKPHOS 80 04/01/2013 0955   ALKPHOS 79 01/24/2012 1008   ALKPHOS 87 08/21/2011 1306   AST 21 04/01/2013 0955   AST 24 01/24/2012 1008   AST 17 08/21/2011 1306   ALT 10 04/01/2013 0955   ALT 21 01/24/2012 1008   ALT 8 08/21/2011 1306   BILITOT 0.74 04/01/2013 0955   BILITOT 0.90 01/24/2012 1008   BILITOT 0.6 08/21/2011 1306       RADIOGRAPHIC STUDIES: No results found.  ASSESSMENT AND PLAN: This is a very pleasant 79 years old white female with recurrent non-small cell lung cancer, adenocarcinoma. The patient is currently undergoing maintenance chemotherapy with single agent Alimta status post 44 cycles and she is tolerating it fairly well. She denied having any significant chest pain, shortness breath, cough or hemoptysis. The patient denied having any nausea or vomiting. We'll proceed today with cycle #44 as scheduled. She would come back for follow up visit in 3 weeks with repeat CT scan of the chest before the next cycle of her treatment. She was advised to call immediately if she has any concerning symptoms in the interval.  Lajuana Matte., MD 04/22/2013

## 2013-04-22 NOTE — Patient Instructions (Signed)
Current therapy: Maintenance chemotherapy with Alimta at 500 mg per meter squared given every 3 weeks status post 44 cycles.  CHEMOTHERAPY INTENT: Palliative/maintenance.  CURRENT # OF CHEMOTHERAPY CYCLES: 45  CURRENT ANTIEMETICS: Zofran, dexamethasone and Compazine  CURRENT SMOKING STATUS: Never Smoker.  ORAL CHEMOTHERAPY AND CONSENT: None  CURRENT BISPHOSPHONATES USE: None  PAIN MANAGEMENT: None  NARCOTICS INDUCED CONSTIPATION: None  LIVING WILL AND CODE STATUS: has living will for no CODE BLUE.

## 2013-04-22 NOTE — Telephone Encounter (Signed)
gv adn printed papt sched and avs for pt for DEc...sed added tx.

## 2013-05-11 ENCOUNTER — Ambulatory Visit (HOSPITAL_COMMUNITY)
Admission: RE | Admit: 2013-05-11 | Discharge: 2013-05-11 | Disposition: A | Discharge status: Home / Self Care | Payer: Medicare Other | Source: Ambulatory Visit | Attending: Internal Medicine | Admitting: Internal Medicine

## 2013-05-11 ENCOUNTER — Encounter (HOSPITAL_COMMUNITY): Payer: Self-pay

## 2013-05-11 DIAGNOSIS — I2510 Atherosclerotic heart disease of native coronary artery without angina pectoris: Secondary | ICD-10-CM | POA: Insufficient documentation

## 2013-05-11 DIAGNOSIS — C3490 Malignant neoplasm of unspecified part of unspecified bronchus or lung: Secondary | ICD-10-CM | POA: Insufficient documentation

## 2013-05-11 DIAGNOSIS — R918 Other nonspecific abnormal finding of lung field: Secondary | ICD-10-CM | POA: Insufficient documentation

## 2013-05-13 ENCOUNTER — Telehealth: Payer: Self-pay | Admitting: *Deleted

## 2013-05-13 ENCOUNTER — Telehealth: Payer: Self-pay | Admitting: Internal Medicine

## 2013-05-13 ENCOUNTER — Other Ambulatory Visit (HOSPITAL_BASED_OUTPATIENT_CLINIC_OR_DEPARTMENT_OTHER): Payer: Medicare Other

## 2013-05-13 ENCOUNTER — Ambulatory Visit (HOSPITAL_COMMUNITY): Payer: Medicare Other

## 2013-05-13 ENCOUNTER — Ambulatory Visit (HOSPITAL_BASED_OUTPATIENT_CLINIC_OR_DEPARTMENT_OTHER): Payer: Medicare Other | Admitting: Internal Medicine

## 2013-05-13 ENCOUNTER — Ambulatory Visit (HOSPITAL_BASED_OUTPATIENT_CLINIC_OR_DEPARTMENT_OTHER): Payer: Medicare Other

## 2013-05-13 DIAGNOSIS — C3410 Malignant neoplasm of upper lobe, unspecified bronchus or lung: Secondary | ICD-10-CM

## 2013-05-13 DIAGNOSIS — Z5111 Encounter for antineoplastic chemotherapy: Secondary | ICD-10-CM

## 2013-05-13 DIAGNOSIS — C801 Malignant (primary) neoplasm, unspecified: Secondary | ICD-10-CM

## 2013-05-13 LAB — CBC WITH DIFFERENTIAL/PLATELET
BASO%: 0.3 % (ref 0.0–2.0)
Basophils Absolute: 0 10*3/uL (ref 0.0–0.1)
EOS%: 1.1 % (ref 0.0–7.0)
Eosinophils Absolute: 0 10*3/uL (ref 0.0–0.5)
HCT: 37.6 % (ref 34.8–46.6)
HGB: 12.6 g/dL (ref 11.6–15.9)
LYMPH%: 36.7 % (ref 14.0–49.7)
MCH: 33.2 pg (ref 25.1–34.0)
MCHC: 33.5 g/dL (ref 31.5–36.0)
MCV: 98.9 fL (ref 79.5–101.0)
MONO%: 12.8 % (ref 0.0–14.0)
NEUT%: 49.1 % (ref 38.4–76.8)
Platelets: 228 10*3/uL (ref 145–400)
lymph#: 1.4 10*3/uL (ref 0.9–3.3)

## 2013-05-13 LAB — COMPREHENSIVE METABOLIC PANEL (CC13)
ALT: 12 U/L (ref 0–55)
AST: 22 U/L (ref 5–34)
Anion Gap: 8 mEq/L (ref 3–11)
BUN: 14.7 mg/dL (ref 7.0–26.0)
Calcium: 9 mg/dL (ref 8.4–10.4)
Chloride: 109 mEq/L (ref 98–109)
Creatinine: 0.8 mg/dL (ref 0.6–1.1)
Total Bilirubin: 0.53 mg/dL (ref 0.20–1.20)

## 2013-05-13 MED ORDER — ONDANSETRON 8 MG/50ML IVPB (CHCC)
8.0000 mg | Freq: Once | INTRAVENOUS | Status: AC
  Administered 2013-05-13: 8 mg via INTRAVENOUS

## 2013-05-13 MED ORDER — DEXAMETHASONE SODIUM PHOSPHATE 10 MG/ML IJ SOLN
10.0000 mg | Freq: Once | INTRAMUSCULAR | Status: AC
  Administered 2013-05-13: 10 mg via INTRAVENOUS

## 2013-05-13 MED ORDER — CYANOCOBALAMIN 1000 MCG/ML IJ SOLN
INTRAMUSCULAR | Status: AC
  Filled 2013-05-13: qty 1

## 2013-05-13 MED ORDER — SODIUM CHLORIDE 0.9 % IV SOLN
Freq: Once | INTRAVENOUS | Status: AC
  Administered 2013-05-13: 13:00:00 via INTRAVENOUS

## 2013-05-13 MED ORDER — SODIUM CHLORIDE 0.9 % IV SOLN
500.0000 mg/m2 | Freq: Once | INTRAVENOUS | Status: AC
  Administered 2013-05-13: 900 mg via INTRAVENOUS
  Filled 2013-05-13: qty 36

## 2013-05-13 MED ORDER — DEXAMETHASONE SODIUM PHOSPHATE 10 MG/ML IJ SOLN
INTRAMUSCULAR | Status: AC
  Filled 2013-05-13: qty 1

## 2013-05-13 MED ORDER — CYANOCOBALAMIN 1000 MCG/ML IJ SOLN: 1000.0000 ug | Freq: Once | INTRAMUSCULAR | Status: DC

## 2013-05-13 MED ORDER — ONDANSETRON 8 MG/NS 50 ML IVPB
INTRAVENOUS | Status: AC
  Filled 2013-05-13: qty 8

## 2013-05-13 MED ORDER — SODIUM CHLORIDE 0.9 % IJ SOLN
10.0000 mL | INTRAMUSCULAR | Status: DC | PRN
  Administered 2013-05-13: 10 mL
  Filled 2013-05-13: qty 10

## 2013-05-13 MED ORDER — HEPARIN SOD (PORK) LOCK FLUSH 100 UNIT/ML IV SOLN
500.0000 [IU] | Freq: Once | INTRAVENOUS | Status: AC | PRN
  Administered 2013-05-13: 500 [IU]
  Filled 2013-05-13: qty 5

## 2013-05-13 NOTE — Telephone Encounter (Signed)
Per staff message and POF I have scheduled appts.  JMW  

## 2013-05-13 NOTE — Progress Notes (Signed)
[facility] [phone]   Fax:([phone]  SHARED VISIT PROGRESS NOTE  [name],[name], MD [address] [place] [address]  Principle Diagnosis and stage: Recurrent non-small cell lung cancer, initially diagnosed a stage IB (T2, N0, M0) adenocarcinoma in June 2008 with a tumor size of 8 cm.   Prior Therapy:  #1 status post left upper lobectomy with lymph node dissection under the care of Dr. Edwyna Shell on 06/29/2006.  #2 status post 4 cycles of adjuvant chemotherapy with cisplatin and Taxotere last dose given 08/01/2006.  #3 status post 6 cycles of systemic chemotherapy with carboplatin and Alimta for disease recurrence last dose given 12/12/2009.   Current therapy: Maintenance chemotherapy with Alimta at 500 mg per meter squared given every 3 weeks status post 45 cycles.  CHEMOTHERAPY INTENT: Palliative/maintenance.  CURRENT # OF CHEMOTHERAPY CYCLES: 46  CURRENT ANTIEMETICS: Zofran, dexamethasone and Compazine  CURRENT SMOKING STATUS: Never Smoker.   ORAL CHEMOTHERAPY AND CONSENT: None  CURRENT BISPHOSPHONATES USE: None  PAIN MANAGEMENT: None  NARCOTICS INDUCED CONSTIPATION: None  LIVING WILL AND CODE STATUS: has living will for no CODE BLUE.  INTERVAL HISTORY: [name] 79 y.o. female returns to the clinic today for follow up visit. The patient is feeling fine today with no specific complaints. She is tolerating her maintenance chemotherapy with single agent Alimta fairly well except for mild fatigue for a few days after chemotherapy. The patient denied having any significant nausea or vomiting. She has no fever or chills. The she denied having any significant chest pain, shortness of breath, cough or hemoptysis. She has no weight loss or night sweats.  MEDICAL HISTORY: Past Medical History  Diagnosis Date  . lung ca dx'd 06/2006    rt and lt lung  . History of migraine headaches   . Hypercholesterolemia   . Lung cancer      ALLERGIES:  is allergic to simvastatin and iodinated diagnostic agents.  MEDICATIONS:  Current Outpatient Prescriptions  Medication Sig Dispense Refill  . Calcium Carbonate-Vit D-Min (CALTRATE PLUS PO) Take 500 mg by mouth 2 (two) times daily.        . folic acid (FOLVITE) 1 MG tablet Take 1 mg by mouth daily.        . Multiple Vitamins-Minerals (CENTRUM SILVER PO) Take 1 tablet by mouth daily.       . potassium gluconate 595 MG TABS Take 595 mg by mouth daily.      . prochlorperazine (COMPAZINE) 10 MG tablet Take 10 mg by mouth every 6 (six) hours as needed.        . ranitidine (ZANTAC) 75 MG tablet Take 75 mg by mouth. Pt takes as needed      . vitamin B-12 (CYANOCOBALAMIN) 500 MCG tablet Take 1,500 mcg by mouth daily.        No current facility-administered medications for this visit.    REVIEW OF SYSTEMS:  A comprehensive review of systems was negative except for: Constitutional: positive for fatigue   PHYSICAL EXAMINATION: General appearance: alert, cooperative and no distress Head: Normocephalic, without obvious abnormality, atraumatic Neck: no adenopathy Lymph nodes: Cervical, supraclavicular, and axillary nodes normal. Resp: clear to auscultation bilaterally Cardio: regular rate and rhythm, S1, S2 normal, no murmur, click, rub or gallop GI: soft, non-tender; bowel sounds normal; no masses,  no organomegaly Extremities: extremities normal, atraumatic, no cyanosis or edema  ECOG PERFORMANCE STATUS: 0 - Asymptomatic  Blood pressure 127/66, pulse 80, temperature 97.9 F (36.6  C), temperature source Oral, resp. rate 18, height 5\' 2"  (1.575 m), weight 151 lb 3.2 oz (68.584 kg), SpO2 100.00%.  LABORATORY DATA: Lab Results  Component Value Date   WBC 3.7* 05/13/2013   HGB 12.6 05/13/2013   HCT 37.6 05/13/2013   MCV 98.9 05/13/2013   PLT 228 05/13/2013      Chemistry      Component Value Date/Time   NA 143 04/22/2013 1012   NA 139 01/24/2012 1008   NA 140 08/21/2011  1306   K 4.1 04/22/2013 1012   K 4.2 01/24/2012 1008   K 4.1 08/21/2011 1306   CL 108* 11/04/2012 0947   CL 103 01/24/2012 1008   CL 107 08/21/2011 1306   CO2 22 04/22/2013 1012   CO2 28 01/24/2012 1008   CO2 22 08/21/2011 1306   BUN 15.9 04/22/2013 1012   BUN 13 01/24/2012 1008   BUN 11 08/21/2011 1306   CREATININE 0.8 04/22/2013 1012   CREATININE 0.8 01/24/2012 1008   CREATININE 0.81 08/21/2011 1306      Component Value Date/Time   CALCIUM 9.3 04/22/2013 1012   CALCIUM 8.6 01/24/2012 1008   CALCIUM 8.8 08/21/2011 1306   ALKPHOS 82 04/22/2013 1012   ALKPHOS 79 01/24/2012 1008   ALKPHOS 87 08/21/2011 1306   AST 23 04/22/2013 1012   AST 24 01/24/2012 1008   AST 17 08/21/2011 1306   ALT 12 04/22/2013 1012   ALT 21 01/24/2012 1008   ALT 8 08/21/2011 1306   BILITOT 0.58 04/22/2013 1012   BILITOT 0.90 01/24/2012 1008   BILITOT 0.6 08/21/2011 1306       RADIOGRAPHIC STUDIES: Ct Chest Wo Contrast  05/11/2013   CLINICAL DATA:  Followup lung cancer  EXAM: CT CHEST WITHOUT CONTRAST  TECHNIQUE: Multidetector CT imaging of the chest was performed following the standard protocol without IV contrast.  COMPARISON:  02/15/2013  FINDINGS: There is no pleural effusion identified. Tumor within the right upper lobe measures 2 x 2 cm, image 24/series 5. Previously 2.3 x 2.1 cm. Adjacent subpleural nodule measures 1.1 cm, image 27/series 5. Previously this measured 1.2 cm. Anterior basal right upper lobe nodule measures 2.0 cm, image 30/series 5. Previously 2.1 cm. Within the right middle lobe there is a 9 mm nodule, image 41/series 5. Subpleural ground-glass attenuating abnormality is again noted within the left upper lobe measuring 1.5 cm. This is stable from previous exam. No new or enlarging pulmonary nodules or mass is identified.  The trachea is patent and is midline. The heart size is normal. No pericardial effusion. Calcified atherosclerotic disease affects the thoracic aorta. The LAD Coronary artery appears  normal. There is no mediastinal or hilar adenopathy identified. No axillary or supraclavicular adenopathy.  Incidental imaging through the upper abdomen is unremarkable.  Review of the visualized osseous structures is significant for mild spondylosis within the thoracic spine.  IMPRESSION: 1. No acute findings. 2. Slight decrease in size of spiculated nodules within the right upper lobe and right middle lobe reflecting response to therapy. 3. Subpleural ground-glass attenuating nodule in a left upper lobe is unchanged from previous exam. 4. Coronary artery calcifications.   Electronically Signed   By: Signa Kell M.D.   On: 05/11/2013 12:30    ASSESSMENT AND PLAN: This is a very pleasant 79 years old white female with recurrent non-small cell lung cancer, adenocarcinoma. The patient is currently undergoing maintenance chemotherapy with single agent Alimta status post 45 cycles and she is tolerating it fairly  well.  Her recent scan showed no evidence for disease progression. I discussed the scan results with the patient today. I recommended for the patient to proceed with her maintenance chemotherapy with single agent Alimta as scheduled. We'll proceed today with cycle #46 as scheduled. She would come back for follow up visit in 3 weeks with the start of the next cycle of her treatment. She was advised to call immediately if she has any concerning symptoms in the interval. I spent 15 minutes counseling the patient face to face. The total time spent in the appointment was 25 minutes.   Lajuana Matte., MD 05/13/2013

## 2013-05-13 NOTE — Patient Instructions (Signed)
Pemetrexed injection What is this medicine? PEMETREXED (PEM e TREX ed) is a chemotherapy drug. This medicine affects cells that are rapidly growing, such as cancer cells and cells in your mouth and stomach. It is usually used to treat lung cancers like non-small cell lung cancer and mesothelioma. It may also be used to treat other cancers. This medicine may be used for other purposes; ask your health care provider or pharmacist if you have questions. COMMON BRAND NAME(S): Alimta What should I tell my health care provider before I take this medicine? They need to know if you have any of these conditions: -if you frequently drink alcohol containing beverages -infection (especially a virus infection such as chickenpox, cold sores, or herpes) -kidney disease -liver disease -low blood counts, like low platelets, red bloods, or white blood cells -an unusual or allergic reaction to pemetrexed, mannitol, other medicines, foods, dyes, or preservatives -pregnant or trying to get pregnant -breast-feeding How should I use this medicine? This drug is given as an infusion into a vein. It is administered in a hospital or clinic by a specially trained health care professional. Talk to your pediatrician regarding the use of this medicine in children. Special care may be needed. Overdosage: If you think you have taken too much of this medicine contact a poison control center or emergency room at once. NOTE: This medicine is only for you. Do not share this medicine with others. What if I miss a dose? It is important not to miss your dose. Call your doctor or health care professional if you are unable to keep an appointment. What may interact with this medicine? -aspirin and aspirin-like medicines -medicines to increase blood counts like filgrastim, pegfilgrastim, sargramostim -methotrexate -NSAIDS, medicines for pain and inflammation, like ibuprofen or naproxen -probenecid -pyrimethamine -vaccines Talk to  your doctor or health care professional before taking any of these medicines: -acetaminophen -aspirin -ibuprofen -ketoprofen -naproxen This list may not describe all possible interactions. Give your health care provider a list of all the medicines, herbs, non-prescription drugs, or dietary supplements you use. Also tell them if you smoke, drink alcohol, or use illegal drugs. Some items may interact with your medicine. What should I watch for while using this medicine? Visit your doctor for checks on your progress. This drug may make you feel generally unwell. This is not uncommon, as chemotherapy can affect healthy cells as well as cancer cells. Report any side effects. Continue your course of treatment even though you feel ill unless your doctor tells you to stop. In some cases, you may be given additional medicines to help with side effects. Follow all directions for their use. Call your doctor or health care professional for advice if you get a fever, chills or sore throat, or other symptoms of a cold or flu. Do not treat yourself. This drug decreases your body's ability to fight infections. Try to avoid being around people who are sick. This medicine may increase your risk to bruise or bleed. Call your doctor or health care professional if you notice any unusual bleeding. Be careful brushing and flossing your teeth or using a toothpick because you may get an infection or bleed more easily. If you have any dental work done, tell your dentist you are receiving this medicine. Avoid taking products that contain aspirin, acetaminophen, ibuprofen, naproxen, or ketoprofen unless instructed by your doctor. These medicines may hide a fever. Call your doctor or health care professional if you get diarrhea or mouth sores. Do not treat   yourself. To protect your kidneys, drink water or other fluids as directed while you are taking this medicine. Men and women must use effective birth control while taking this  medicine. You may also need to continue using effective birth control for a time after stopping this medicine. Do not become pregnant while taking this medicine. Tell your doctor right away if you think that you or your partner might be pregnant. There is a potential for serious side effects to an unborn child. Talk to your health care professional or pharmacist for more information. Do not breast-feed an infant while taking this medicine. This medicine may lower sperm counts. What side effects may I notice from receiving this medicine? Side effects that you should report to your doctor or health care professional as soon as possible: -allergic reactions like skin rash, itching or hives, swelling of the face, lips, or tongue -low blood counts - this medicine may decrease the number of white blood cells, red blood cells and platelets. You may be at increased risk for infections and bleeding. -signs of infection - fever or chills, cough, sore throat, pain or difficulty passing urine -signs of decreased platelets or bleeding - bruising, pinpoint red spots on the skin, black, tarry stools, blood in the urine -signs of decreased red blood cells - unusually weak or tired, fainting spells, lightheadedness -breathing problems, like a dry cough -changes in emotions or moods -chest pain -confusion -diarrhea -high blood pressure -mouth or throat sores or ulcers -pain, swelling, warmth in the leg -pain on swallowing -swelling of the ankles, feet, hands -trouble passing urine or change in the amount of urine -vomiting -yellowing of the eyes or skin Side effects that usually do not require medical attention (report to your doctor or health care professional if they continue or are bothersome): -hair loss -loss of appetite -nausea -stomach upset This list may not describe all possible side effects. Call your doctor for medical advice about side effects. You may report side effects to FDA at  1-800-FDA-1088. Where should I keep my medicine? This drug is given in a hospital or clinic and will not be stored at home. NOTE: This sheet is a summary. It may not cover all possible information. If you have questions about this medicine, talk to your doctor, pharmacist, or health care provider.  2014, [employer]vier/Gold Standard. (2007-12-22 13:24:03)  

## 2013-05-13 NOTE — Telephone Encounter (Signed)
appts made per 12/11 POF Email to MW to add Tx AVS and CAL given shh

## 2013-05-14 ENCOUNTER — Encounter: Payer: Self-pay | Admitting: Internal Medicine

## 2013-06-02 ENCOUNTER — Ambulatory Visit (HOSPITAL_BASED_OUTPATIENT_CLINIC_OR_DEPARTMENT_OTHER): Payer: Medicare Other | Admitting: Internal Medicine

## 2013-06-02 ENCOUNTER — Ambulatory Visit (HOSPITAL_BASED_OUTPATIENT_CLINIC_OR_DEPARTMENT_OTHER): Payer: Medicare Other

## 2013-06-02 ENCOUNTER — Telehealth: Payer: Self-pay | Admitting: Internal Medicine

## 2013-06-02 ENCOUNTER — Other Ambulatory Visit (HOSPITAL_BASED_OUTPATIENT_CLINIC_OR_DEPARTMENT_OTHER): Payer: Medicare Other

## 2013-06-02 ENCOUNTER — Encounter: Payer: Self-pay | Admitting: Internal Medicine

## 2013-06-02 DIAGNOSIS — C3410 Malignant neoplasm of upper lobe, unspecified bronchus or lung: Secondary | ICD-10-CM

## 2013-06-02 DIAGNOSIS — R5381 Other malaise: Secondary | ICD-10-CM

## 2013-06-02 DIAGNOSIS — Z5111 Encounter for antineoplastic chemotherapy: Secondary | ICD-10-CM

## 2013-06-02 DIAGNOSIS — C801 Malignant (primary) neoplasm, unspecified: Secondary | ICD-10-CM

## 2013-06-02 LAB — COMPREHENSIVE METABOLIC PANEL (CC13)
ALT: 10 U/L (ref 0–55)
Albumin: 3.3 g/dL — ABNORMAL LOW (ref 3.5–5.0)
Alkaline Phosphatase: 79 U/L (ref 40–150)
Anion Gap: 12 mEq/L — ABNORMAL HIGH (ref 3–11)
BUN: 11 mg/dL (ref 7.0–26.0)
CO2: 19 mEq/L — ABNORMAL LOW (ref 22–29)
Calcium: 8.4 mg/dL (ref 8.4–10.4)
Glucose: 94 mg/dl (ref 70–140)
Potassium: 3.6 mEq/L (ref 3.5–5.1)
Sodium: 142 mEq/L (ref 136–145)
Total Protein: 6.3 g/dL — ABNORMAL LOW (ref 6.4–8.3)

## 2013-06-02 LAB — CBC WITH DIFFERENTIAL/PLATELET
Basophils Absolute: 0 10*3/uL (ref 0.0–0.1)
Eosinophils Absolute: 0.1 10*3/uL (ref 0.0–0.5)
HCT: 38 % (ref 34.8–46.6)
HGB: 12.7 g/dL (ref 11.6–15.9)
MCH: 32.9 pg (ref 25.1–34.0)
MCV: 98.4 fL (ref 79.5–101.0)
MONO%: 10.5 % (ref 0.0–14.0)
NEUT#: 1.9 10*3/uL (ref 1.5–6.5)
NEUT%: 40.6 % (ref 38.4–76.8)
Platelets: 242 10*3/uL (ref 145–400)
RDW: 14.7 % — ABNORMAL HIGH (ref 11.2–14.5)
lymph#: 2.2 10*3/uL (ref 0.9–3.3)

## 2013-06-02 MED ORDER — SODIUM CHLORIDE 0.9 % IV SOLN
500.0000 mg/m2 | Freq: Once | INTRAVENOUS | Status: AC
  Administered 2013-06-02: 900 mg via INTRAVENOUS
  Filled 2013-06-02: qty 36

## 2013-06-02 MED ORDER — DEXAMETHASONE SODIUM PHOSPHATE 10 MG/ML IJ SOLN
INTRAMUSCULAR | Status: AC
  Filled 2013-06-02: qty 1

## 2013-06-02 MED ORDER — SODIUM CHLORIDE 0.9 % IJ SOLN
10.0000 mL | INTRAMUSCULAR | Status: DC | PRN
  Administered 2013-06-02: 10 mL
  Filled 2013-06-02: qty 10

## 2013-06-02 MED ORDER — ONDANSETRON 8 MG/50ML IVPB (CHCC)
8.0000 mg | Freq: Once | INTRAVENOUS | Status: AC
  Administered 2013-06-02: 8 mg via INTRAVENOUS

## 2013-06-02 MED ORDER — HEPARIN SOD (PORK) LOCK FLUSH 100 UNIT/ML IV SOLN
500.0000 [IU] | Freq: Once | INTRAVENOUS | Status: AC | PRN
  Administered 2013-06-02: 500 [IU]
  Filled 2013-06-02: qty 5

## 2013-06-02 MED ORDER — ONDANSETRON 8 MG/NS 50 ML IVPB
INTRAVENOUS | Status: AC
  Filled 2013-06-02: qty 8

## 2013-06-02 MED ORDER — SODIUM CHLORIDE 0.9 % IV SOLN
Freq: Once | INTRAVENOUS | Status: AC
  Administered 2013-06-02: 15:00:00 via INTRAVENOUS

## 2013-06-02 MED ORDER — DEXAMETHASONE SODIUM PHOSPHATE 10 MG/ML IJ SOLN
10.0000 mg | Freq: Once | INTRAMUSCULAR | Status: AC
  Administered 2013-06-02: 10 mg via INTRAVENOUS

## 2013-06-02 NOTE — Telephone Encounter (Signed)
gv and printed appt sched and avsw for pt for Jan 2015....Marland Kitchensed added tx.

## 2013-06-02 NOTE — Patient Instructions (Signed)
Pemetrexed injection What is this medicine? PEMETREXED (PEM e TREX ed) is a chemotherapy drug. This medicine affects cells that are rapidly growing, such as cancer cells and cells in your mouth and stomach. It is usually used to treat lung cancers like non-small cell lung cancer and mesothelioma. It may also be used to treat other cancers. This medicine may be used for other purposes; ask your health care provider or pharmacist if you have questions. COMMON BRAND NAME(S): Alimta What should I tell my health care provider before I take this medicine? They need to know if you have any of these conditions: -if you frequently drink alcohol containing beverages -infection (especially a virus infection such as chickenpox, cold sores, or herpes) -kidney disease -liver disease -low blood counts, like low platelets, red bloods, or white blood cells -an unusual or allergic reaction to pemetrexed, mannitol, other medicines, foods, dyes, or preservatives -pregnant or trying to get pregnant -breast-feeding How should I use this medicine? This drug is given as an infusion into a vein. It is administered in a hospital or clinic by a specially trained health care professional. Talk to your pediatrician regarding the use of this medicine in children. Special care may be needed. Overdosage: If you think you have taken too much of this medicine contact a poison control center or emergency room at once. NOTE: This medicine is only for you. Do not share this medicine with others. What if I miss a dose? It is important not to miss your dose. Call your doctor or health care professional if you are unable to keep an appointment. What may interact with this medicine? -aspirin and aspirin-like medicines -medicines to increase blood counts like filgrastim, pegfilgrastim, sargramostim -methotrexate -NSAIDS, medicines for pain and inflammation, like ibuprofen or naproxen -probenecid -pyrimethamine -vaccines Talk to  your doctor or health care professional before taking any of these medicines: -acetaminophen -aspirin -ibuprofen -ketoprofen -naproxen This list may not describe all possible interactions. Give your health care provider a list of all the medicines, herbs, non-prescription drugs, or dietary supplements you use. Also tell them if you smoke, drink alcohol, or use illegal drugs. Some items may interact with your medicine. What should I watch for while using this medicine? Visit your doctor for checks on your progress. This drug may make you feel generally unwell. This is not uncommon, as chemotherapy can affect healthy cells as well as cancer cells. Report any side effects. Continue your course of treatment even though you feel ill unless your doctor tells you to stop. In some cases, you may be given additional medicines to help with side effects. Follow all directions for their use. Call your doctor or health care professional for advice if you get a fever, chills or sore throat, or other symptoms of a cold or flu. Do not treat yourself. This drug decreases your body's ability to fight infections. Try to avoid being around people who are sick. This medicine may increase your risk to bruise or bleed. Call your doctor or health care professional if you notice any unusual bleeding. Be careful brushing and flossing your teeth or using a toothpick because you may get an infection or bleed more easily. If you have any dental work done, tell your dentist you are receiving this medicine. Avoid taking products that contain aspirin, acetaminophen, ibuprofen, naproxen, or ketoprofen unless instructed by your doctor. These medicines may hide a fever. Call your doctor or health care professional if you get diarrhea or mouth sores. Do not treat   yourself. To protect your kidneys, drink water or other fluids as directed while you are taking this medicine. Men and women must use effective birth control while taking this  medicine. You may also need to continue using effective birth control for a time after stopping this medicine. Do not become pregnant while taking this medicine. Tell your doctor right away if you think that you or your partner might be pregnant. There is a potential for serious side effects to an unborn child. Talk to your health care professional or pharmacist for more information. Do not breast-feed an infant while taking this medicine. This medicine may lower sperm counts. What side effects may I notice from receiving this medicine? Side effects that you should report to your doctor or health care professional as soon as possible: -allergic reactions like skin rash, itching or hives, swelling of the face, lips, or tongue -low blood counts - this medicine may decrease the number of white blood cells, red blood cells and platelets. You may be at increased risk for infections and bleeding. -signs of infection - fever or chills, cough, sore throat, pain or difficulty passing urine -signs of decreased platelets or bleeding - bruising, pinpoint red spots on the skin, black, tarry stools, blood in the urine -signs of decreased red blood cells - unusually weak or tired, fainting spells, lightheadedness -breathing problems, like a dry cough -changes in emotions or moods -chest pain -confusion -diarrhea -high blood pressure -mouth or throat sores or ulcers -pain, swelling, warmth in the leg -pain on swallowing -swelling of the ankles, feet, hands -trouble passing urine or change in the amount of urine -vomiting -yellowing of the eyes or skin Side effects that usually do not require medical attention (report to your doctor or health care professional if they continue or are bothersome): -hair loss -loss of appetite -nausea -stomach upset This list may not describe all possible side effects. Call your doctor for medical advice about side effects. You may report side effects to FDA at  1-800-FDA-1088. Where should I keep my medicine? This drug is given in a hospital or clinic and will not be stored at home. NOTE: This sheet is a summary. It may not cover all possible information. If you have questions about this medicine, talk to your doctor, pharmacist, or health care provider.  2014, [employer]vier/Gold Standard. (2007-12-22 13:24:03)  

## 2013-06-02 NOTE — Progress Notes (Signed)
[facility] [phone]   [phone]  SHARED VISIT PROGRESS NOTE  [name],[name], MD [address] [place] [address]  Principle Diagnosis and stage: Recurrent non-small cell lung cancer, initially diagnosed a stage IB (T2, N0, M0) adenocarcinoma in June 2008 with a tumor size of 8 cm.   Prior Therapy:  #1 status post left upper lobectomy with lymph node dissection under the care of Dr. Edwyna Shell on 06/29/2006.  #2 status post 4 cycles of adjuvant chemotherapy with cisplatin and Taxotere last dose given 08/01/2006.  #3 status post 6 cycles of systemic chemotherapy with carboplatin and Alimta for disease recurrence last dose given 12/12/2009.   Current therapy: Maintenance chemotherapy with Alimta at 500 mg per meter squared given every 3 weeks status post 46 cycles.  CHEMOTHERAPY INTENT: Palliative/maintenance.  CURRENT # OF CHEMOTHERAPY CYCLES: 47  CURRENT ANTIEMETICS: Zofran, dexamethasone and Compazine  CURRENT SMOKING STATUS: Never Smoker.   ORAL CHEMOTHERAPY AND CONSENT: None  CURRENT BISPHOSPHONATES USE: None  PAIN MANAGEMENT: None  NARCOTICS INDUCED CONSTIPATION: None  LIVING WILL AND CODE STATUS: has living will for no CODE BLUE.  INTERVAL HISTORY: [name] 79 y.o. female returns to the clinic today for follow up visit. The patient is feeling fine today with no specific complaints. She is tolerating her maintenance chemotherapy with single agent Alimta fairly well except for mild fatigue for a few days after chemotherapy. The patient denied having any significant nausea or vomiting. She has no fever or chills. The she denied having any significant chest pain, shortness of breath, cough or hemoptysis. She has no weight loss or night sweats.  MEDICAL HISTORY: Past Medical History  Diagnosis Date  . lung ca dx'd 06/2006    rt and lt lung  . History of migraine headaches   . Hypercholesterolemia   . Lung cancer      ALLERGIES:  is allergic to simvastatin and iodinated diagnostic agents.  MEDICATIONS:  Current Outpatient Prescriptions  Medication Sig Dispense Refill  . Calcium Carbonate-Vit D-Min (CALTRATE PLUS PO) Take 500 mg by mouth 2 (two) times daily.        . folic acid (FOLVITE) 1 MG tablet Take 1 mg by mouth daily.        . Multiple Vitamins-Minerals (CENTRUM SILVER PO) Take 1 tablet by mouth daily.       . potassium gluconate 595 MG TABS Take 595 mg by mouth daily.      . prochlorperazine (COMPAZINE) 10 MG tablet Take 10 mg by mouth every 6 (six) hours as needed.        . ranitidine (ZANTAC) 75 MG tablet Take 75 mg by mouth. Pt takes as needed      . vitamin B-12 (CYANOCOBALAMIN) 500 MCG tablet Take 1,500 mcg by mouth daily.        No current facility-administered medications for this visit.    REVIEW OF SYSTEMS:  A comprehensive review of systems was negative except for: Constitutional: positive for fatigue   PHYSICAL EXAMINATION: General appearance: alert, cooperative and no distress Head: Normocephalic, without obvious abnormality, atraumatic Neck: no adenopathy Lymph nodes: Cervical, supraclavicular, and axillary nodes normal. Resp: clear to auscultation bilaterally Cardio: regular rate and rhythm, S1, S2 normal, no murmur, click, rub or gallop GI: soft, non-tender; bowel sounds normal; no masses,  no organomegaly Extremities: extremities normal, atraumatic, no cyanosis or edema  ECOG PERFORMANCE STATUS: 0 - Asymptomatic  There were no vitals taken for this visit.  LABORATORY DATA: Lab Results  Component Value Date   WBC 4.6 06/02/2013   HGB 12.7 06/02/2013   HCT 38.0 06/02/2013   MCV 98.4 06/02/2013   PLT 242 06/02/2013      Chemistry      Component Value Date/Time   NA 140 05/13/2013 1149   NA 139 01/24/2012 1008   NA 140 08/21/2011 1306   K 4.2 05/13/2013 1149   K 4.2 01/24/2012 1008   K 4.1 08/21/2011 1306   CL 108* 11/04/2012 0947   CL 103 01/24/2012 1008   CL  107 08/21/2011 1306   CO2 24 05/13/2013 1149   CO2 28 01/24/2012 1008   CO2 22 08/21/2011 1306   BUN 14.7 05/13/2013 1149   BUN 13 01/24/2012 1008   BUN 11 08/21/2011 1306   CREATININE 0.8 05/13/2013 1149   CREATININE 0.8 01/24/2012 1008   CREATININE 0.81 08/21/2011 1306      Component Value Date/Time   CALCIUM 9.0 05/13/2013 1149   CALCIUM 8.6 01/24/2012 1008   CALCIUM 8.8 08/21/2011 1306   ALKPHOS 79 05/13/2013 1149   ALKPHOS 79 01/24/2012 1008   ALKPHOS 87 08/21/2011 1306   AST 22 05/13/2013 1149   AST 24 01/24/2012 1008   AST 17 08/21/2011 1306   ALT 12 05/13/2013 1149   ALT 21 01/24/2012 1008   ALT 8 08/21/2011 1306   BILITOT 0.53 05/13/2013 1149   BILITOT 0.90 01/24/2012 1008   BILITOT 0.6 08/21/2011 1306       RADIOGRAPHIC STUDIES:  ASSESSMENT AND PLAN: This is a very pleasant 79 years old white female with recurrent non-small cell lung cancer, adenocarcinoma. The patient is currently undergoing maintenance chemotherapy with single agent Alimta status post 46 cycles and she is tolerating it fairly well.  The patient is doing fine today. I recommended for the patient to proceed with her maintenance chemotherapy with single agent Alimta as scheduled. We'll proceed today with cycle #47 as scheduled. She would come back for follow up visit in 3 weeks with the start of the next cycle of her treatment. She was advised to call immediately if she has any concerning symptoms in the interval.   Lajuana Matte., MD 06/02/2013

## 2013-06-03 NOTE — Patient Instructions (Signed)
Current therapy: Maintenance chemotherapy with Alimta at 500 mg per meter squared given every 3 weeks status post 46 cycles.  CHEMOTHERAPY INTENT: Palliative/maintenance.  CURRENT # OF CHEMOTHERAPY CYCLES: 47  CURRENT ANTIEMETICS: Zofran, dexamethasone and Compazine  CURRENT SMOKING STATUS: Never Smoker.  ORAL CHEMOTHERAPY AND CONSENT: None  CURRENT BISPHOSPHONATES USE: None  PAIN MANAGEMENT: None  NARCOTICS INDUCED CONSTIPATION: None  LIVING WILL AND CODE STATUS: has living will for no CODE BLUE.

## 2013-06-07 ENCOUNTER — Ambulatory Visit: Payer: Medicare Other | Admitting: Internal Medicine

## 2013-06-07 ENCOUNTER — Other Ambulatory Visit: Payer: Medicare Other

## 2013-06-07 ENCOUNTER — Ambulatory Visit: Payer: Medicare Other

## 2013-06-23 ENCOUNTER — Telehealth: Payer: Self-pay | Admitting: Internal Medicine

## 2013-06-23 ENCOUNTER — Encounter: Payer: Self-pay | Admitting: Internal Medicine

## 2013-06-23 ENCOUNTER — Ambulatory Visit (HOSPITAL_BASED_OUTPATIENT_CLINIC_OR_DEPARTMENT_OTHER): Payer: Medicare Other

## 2013-06-23 ENCOUNTER — Encounter: Payer: Self-pay | Admitting: Physician Assistant

## 2013-06-23 ENCOUNTER — Other Ambulatory Visit (HOSPITAL_BASED_OUTPATIENT_CLINIC_OR_DEPARTMENT_OTHER): Payer: Medicare Other

## 2013-06-23 ENCOUNTER — Ambulatory Visit (HOSPITAL_BASED_OUTPATIENT_CLINIC_OR_DEPARTMENT_OTHER): Payer: Medicare Other | Admitting: Physician Assistant

## 2013-06-23 VITALS — BP 139/77 | HR 70 | Temp 97.6°F | Resp 18 | Ht 62.0 in | Wt 148.7 lb

## 2013-06-23 DIAGNOSIS — C3410 Malignant neoplasm of upper lobe, unspecified bronchus or lung: Secondary | ICD-10-CM

## 2013-06-23 DIAGNOSIS — C801 Malignant (primary) neoplasm, unspecified: Secondary | ICD-10-CM

## 2013-06-23 DIAGNOSIS — C3490 Malignant neoplasm of unspecified part of unspecified bronchus or lung: Secondary | ICD-10-CM

## 2013-06-23 DIAGNOSIS — Z5111 Encounter for antineoplastic chemotherapy: Secondary | ICD-10-CM

## 2013-06-23 LAB — COMPREHENSIVE METABOLIC PANEL (CC13)
ALT: 8 U/L (ref 0–55)
AST: 20 U/L (ref 5–34)
Albumin: 3.2 g/dL — ABNORMAL LOW (ref 3.5–5.0)
Alkaline Phosphatase: 70 U/L (ref 40–150)
Anion Gap: 9 mEq/L (ref 3–11)
BUN: 14.1 mg/dL (ref 7.0–26.0)
CALCIUM: 8.7 mg/dL (ref 8.4–10.4)
CHLORIDE: 110 meq/L — AB (ref 98–109)
CO2: 23 mEq/L (ref 22–29)
CREATININE: 0.7 mg/dL (ref 0.6–1.1)
GLUCOSE: 120 mg/dL (ref 70–140)
Potassium: 3.6 mEq/L (ref 3.5–5.1)
Sodium: 141 mEq/L (ref 136–145)
Total Bilirubin: 0.63 mg/dL (ref 0.20–1.20)
Total Protein: 6.1 g/dL — ABNORMAL LOW (ref 6.4–8.3)

## 2013-06-23 LAB — CBC WITH DIFFERENTIAL/PLATELET
BASO%: 0.3 % (ref 0.0–2.0)
BASOS ABS: 0 10*3/uL (ref 0.0–0.1)
EOS%: 1.7 % (ref 0.0–7.0)
Eosinophils Absolute: 0.1 10*3/uL (ref 0.0–0.5)
HEMATOCRIT: 38.1 % (ref 34.8–46.6)
HEMOGLOBIN: 12.8 g/dL (ref 11.6–15.9)
LYMPH#: 1.4 10*3/uL (ref 0.9–3.3)
LYMPH%: 40.2 % (ref 14.0–49.7)
MCH: 33.1 pg (ref 25.1–34.0)
MCHC: 33.6 g/dL (ref 31.5–36.0)
MCV: 98.4 fL (ref 79.5–101.0)
MONO#: 0.5 10*3/uL (ref 0.1–0.9)
MONO%: 13 % (ref 0.0–14.0)
NEUT#: 1.6 10*3/uL (ref 1.5–6.5)
NEUT%: 44.8 % (ref 38.4–76.8)
Platelets: 236 10*3/uL (ref 145–400)
RBC: 3.87 10*6/uL (ref 3.70–5.45)
RDW: 14.7 % — ABNORMAL HIGH (ref 11.2–14.5)
WBC: 3.5 10*3/uL — ABNORMAL LOW (ref 3.9–10.3)

## 2013-06-23 MED ORDER — SODIUM CHLORIDE 0.9 % IJ SOLN
10.0000 mL | INTRAMUSCULAR | Status: DC | PRN
  Administered 2013-06-23: 10 mL
  Filled 2013-06-23: qty 10

## 2013-06-23 MED ORDER — ONDANSETRON 8 MG/50ML IVPB (CHCC)
8.0000 mg | Freq: Once | INTRAVENOUS | Status: AC
  Administered 2013-06-23: 8 mg via INTRAVENOUS

## 2013-06-23 MED ORDER — ONDANSETRON 8 MG/NS 50 ML IVPB
INTRAVENOUS | Status: AC
  Filled 2013-06-23: qty 8

## 2013-06-23 MED ORDER — HEPARIN SOD (PORK) LOCK FLUSH 100 UNIT/ML IV SOLN
500.0000 [IU] | Freq: Once | INTRAVENOUS | Status: AC | PRN
  Administered 2013-06-23: 500 [IU]
  Filled 2013-06-23: qty 5

## 2013-06-23 MED ORDER — CYANOCOBALAMIN 1000 MCG/ML IJ SOLN
1000.0000 ug | Freq: Once | INTRAMUSCULAR | Status: AC
  Administered 2013-06-23: 1000 ug via INTRAMUSCULAR

## 2013-06-23 MED ORDER — SODIUM CHLORIDE 0.9 % IV SOLN
500.0000 mg/m2 | Freq: Once | INTRAVENOUS | Status: AC
  Administered 2013-06-23: 900 mg via INTRAVENOUS
  Filled 2013-06-23: qty 36

## 2013-06-23 MED ORDER — DEXAMETHASONE SODIUM PHOSPHATE 10 MG/ML IJ SOLN
10.0000 mg | Freq: Once | INTRAMUSCULAR | Status: AC
  Administered 2013-06-23: 10 mg via INTRAVENOUS

## 2013-06-23 MED ORDER — CYANOCOBALAMIN 1000 MCG/ML IJ SOLN
INTRAMUSCULAR | Status: AC
  Filled 2013-06-23: qty 1

## 2013-06-23 MED ORDER — DEXAMETHASONE SODIUM PHOSPHATE 10 MG/ML IJ SOLN
INTRAMUSCULAR | Status: AC
  Filled 2013-06-23: qty 1

## 2013-06-23 MED ORDER — SODIUM CHLORIDE 0.9 % IV SOLN
Freq: Once | INTRAVENOUS | Status: AC
  Administered 2013-06-23: 12:00:00 via INTRAVENOUS

## 2013-06-23 NOTE — Progress Notes (Addendum)
[place] Telephone:(336) [phone]   Fax:(336) [place], MD [place] [address]  Principle Diagnosis and stage: Recurrent non-small cell lung cancer, initially diagnosed a stage IB (T2, N0, M0) adenocarcinoma in June 2008 with a tumor size of 8 cm.   Prior Therapy:  #1 status post left upper lobectomy with lymph node dissection under the care of Dr. Arlyce Dice on 06/29/2006.  #2 status post 4 cycles of adjuvant chemotherapy with cisplatin and Taxotere last dose given 08/01/2006.  #3 status post 6 cycles of systemic chemotherapy with carboplatin and Alimta for disease recurrence last dose given 12/12/2009.   Current therapy: Maintenance chemotherapy with Alimta at 500 mg per meter squared given every 3 weeks status post 47 cycles.  CHEMOTHERAPY INTENT: Palliative/maintenance.  CURRENT # OF CHEMOTHERAPY CYCLES: 48  CURRENT ANTIEMETICS: Zofran, dexamethasone and Compazine  CURRENT SMOKING STATUS: Never Smoker.   ORAL CHEMOTHERAPY AND CONSENT: None  CURRENT BISPHOSPHONATES USE: None  PAIN MANAGEMENT: None  NARCOTICS INDUCED CONSTIPATION: None  LIVING WILL AND CODE STATUS: has living will for no CODE BLUE.  INTERVAL HISTORY: [name] 80 y.o. female returns to the clinic today for follow up visit. The patient is feeling fine today with no specific complaints. She is tolerating her maintenance chemotherapy with single agent Alimta fairly well except for mild fatigue for a few days after chemotherapy. She also reports that her cheeks it or bad and feels hot the day after chemotherapy and then resolves the second day after chemotherapy. She applies palms cold cream to her face and cheeks and this helps [name] the discomfort. She has not been taking her dexamethasone the day before, the day of, and the day after chemotherapy for "a long time". She does however continue to take her folic acid. She does  report some constipation that occurs 3-4 days after chemotherapy that resolves naturally with the ingestion of applesauce. The patient denied having any significant nausea or vomiting. She has no fever or chills. The she denied having any significant chest pain, shortness of breath, cough or hemoptysis. She has no weight loss or night sweats.  MEDICAL HISTORY: Past Medical History  Diagnosis Date  . lung ca dx'd 06/2006    rt and lt lung  . History of migraine headaches   . Hypercholesterolemia   . Lung cancer     ALLERGIES:  is allergic to simvastatin and iodinated diagnostic agents.  MEDICATIONS:  Current Outpatient Prescriptions  Medication Sig Dispense Refill  . Calcium Carbonate-Vit D-Min (CALTRATE PLUS PO) Take 500 mg by mouth 2 (two) times daily.        . folic acid (FOLVITE) 1 MG tablet Take 1 mg by mouth daily.        . Multiple Vitamins-Minerals (CENTRUM SILVER PO) Take 1 tablet by mouth daily.       . potassium gluconate 595 MG TABS Take 595 mg by mouth daily.      . ranitidine (ZANTAC) 75 MG tablet Take 75 mg by mouth. Pt takes as needed      . vitamin B-12 (CYANOCOBALAMIN) 500 MCG tablet Take 1,500 mcg by mouth daily.       . prochlorperazine (COMPAZINE) 10 MG tablet Take 10 mg by mouth every 6 (six) hours as needed.         No current facility-administered medications for this visit.    REVIEW OF SYSTEMS:  A comprehensive review of systems was  negative except for: Constitutional: positive for fatigue   PHYSICAL EXAMINATION: General appearance: alert, cooperative and no distress Head: Normocephalic, without obvious abnormality, atraumatic Neck: no adenopathy Lymph nodes: Cervical, supraclavicular, and axillary nodes normal. Resp: clear to auscultation bilaterally Cardio: regular rate and rhythm, S1, S2 normal, no murmur, click, rub or gallop GI: soft, non-tender; bowel sounds normal; no masses,  no organomegaly Extremities: extremities normal, atraumatic, no cyanosis  or edema  ECOG PERFORMANCE STATUS: 0 - Asymptomatic  Blood pressure 139/77, pulse 70, temperature 97.6 F (36.4 C), temperature source Oral, resp. rate 18, height 5\' 2"  (1.575 m), weight 148 lb 11.2 oz (67.45 kg).  LABORATORY DATA: Lab Results  Component Value Date   WBC 3.5* 06/23/2013   HGB 12.8 06/23/2013   HCT 38.1 06/23/2013   MCV 98.4 06/23/2013   PLT 236 06/23/2013      Chemistry      Component Value Date/Time   NA 141 06/23/2013 0956   NA 139 01/24/2012 1008   NA 140 08/21/2011 1306   K 3.6 06/23/2013 0956   K 4.2 01/24/2012 1008   K 4.1 08/21/2011 1306   CL 108* 11/04/2012 0947   CL 103 01/24/2012 1008   CL 107 08/21/2011 1306   CO2 23 06/23/2013 0956   CO2 28 01/24/2012 1008   CO2 22 08/21/2011 1306   BUN 14.1 06/23/2013 0956   BUN 13 01/24/2012 1008   BUN 11 08/21/2011 1306   CREATININE 0.7 06/23/2013 0956   CREATININE 0.8 01/24/2012 1008   CREATININE 0.81 08/21/2011 1306      Component Value Date/Time   CALCIUM 8.7 06/23/2013 0956   CALCIUM 8.6 01/24/2012 1008   CALCIUM 8.8 08/21/2011 1306   ALKPHOS 70 06/23/2013 0956   ALKPHOS 79 01/24/2012 1008   ALKPHOS 87 08/21/2011 1306   AST 20 06/23/2013 0956   AST 24 01/24/2012 1008   AST 17 08/21/2011 1306   ALT 8 06/23/2013 0956   ALT 21 01/24/2012 1008   ALT 8 08/21/2011 1306   BILITOT 0.63 06/23/2013 0956   BILITOT 0.90 01/24/2012 1008   BILITOT 0.6 08/21/2011 1306       RADIOGRAPHIC STUDIES:  ASSESSMENT AND PLAN: This is a very pleasant 80 years old white female with recurrent non-small cell lung cancer, adenocarcinoma. The patient is currently undergoing maintenance chemotherapy with single agent Alimta status post 47 cycles and she is tolerating it fairly well.  The patient is doing fine today. Patient was discussed with also seen by Dr. Julien Nordmann. She will continue her home symptomatic relief of her inflamed cheeks and constipation. She will proceed with cycle #48 of her maintenance chemotherapy with single agent Alimta today as  scheduled. She'll return in 3 weeks prior to cycle #49 another symptom management visit.  She was advised to call immediately if she has any concerning symptoms in the interval.   Carlton Adam, [name] 06/23/2013  ADDENDUM:  Hematology/Oncology Attending:  I had the face to face encounter with the patient today. I recommended her care plan. This is a very pleasant 80 years old white female with recurrent non-small cell lung cancer, adenocarcinoma undergoing maintenance chemotherapy with single agent Alimta status post 47 cycles. The patient tolerating her treatment fairly well except for redness of the cheeks 1-2 days after her chemotherapy most likely secondary to the IV steroid premedications. I assured the patient that this is normal after her treatment with steroids. I recommended for her to continue her current treatment with maintenance Alimta as  scheduled.  She would come back for followup visit in 3 weeks with the next cycle of her chemotherapy. She was advised to call immediately she has any concerning symptoms in the interval.  Disclaimer: This note was dictated with voice recognition software. Similar sounding words can inadvertently be transcribed and may not be corrected upon review. [name]., MD 06/23/2013

## 2013-06-23 NOTE — Telephone Encounter (Signed)
gv adn printed appt sched and avs for pt for Feb....sed added tx.

## 2013-06-23 NOTE — Patient Instructions (Signed)
Follow-up in 3 weeks

## 2013-07-14 ENCOUNTER — Telehealth: Payer: Self-pay | Admitting: Internal Medicine

## 2013-07-14 ENCOUNTER — Ambulatory Visit (HOSPITAL_BASED_OUTPATIENT_CLINIC_OR_DEPARTMENT_OTHER): Payer: Medicare Other

## 2013-07-14 ENCOUNTER — Other Ambulatory Visit (HOSPITAL_BASED_OUTPATIENT_CLINIC_OR_DEPARTMENT_OTHER): Payer: Medicare Other

## 2013-07-14 ENCOUNTER — Encounter (INDEPENDENT_AMBULATORY_CARE_PROVIDER_SITE_OTHER): Payer: Self-pay

## 2013-07-14 ENCOUNTER — Encounter: Payer: Self-pay | Admitting: Internal Medicine

## 2013-07-14 ENCOUNTER — Ambulatory Visit (HOSPITAL_BASED_OUTPATIENT_CLINIC_OR_DEPARTMENT_OTHER): Payer: Medicare Other | Admitting: Internal Medicine

## 2013-07-14 VITALS — BP 127/70 | HR 73 | Temp 98.0°F | Resp 18 | Ht 62.0 in | Wt 149.3 lb

## 2013-07-14 DIAGNOSIS — C3491 Malignant neoplasm of unspecified part of right bronchus or lung: Secondary | ICD-10-CM

## 2013-07-14 DIAGNOSIS — C3410 Malignant neoplasm of upper lobe, unspecified bronchus or lung: Secondary | ICD-10-CM

## 2013-07-14 DIAGNOSIS — C3490 Malignant neoplasm of unspecified part of unspecified bronchus or lung: Secondary | ICD-10-CM

## 2013-07-14 DIAGNOSIS — Z5111 Encounter for antineoplastic chemotherapy: Secondary | ICD-10-CM

## 2013-07-14 LAB — CBC WITH DIFFERENTIAL/PLATELET
BASO%: 0.6 % (ref 0.0–2.0)
BASOS ABS: 0 10*3/uL (ref 0.0–0.1)
EOS%: 1.4 % (ref 0.0–7.0)
Eosinophils Absolute: 0.1 10*3/uL (ref 0.0–0.5)
HCT: 39.2 % (ref 34.8–46.6)
HGB: 13.1 g/dL (ref 11.6–15.9)
LYMPH#: 1.5 10*3/uL (ref 0.9–3.3)
LYMPH%: 42 % (ref 14.0–49.7)
MCH: 33.1 pg (ref 25.1–34.0)
MCHC: 33.4 g/dL (ref 31.5–36.0)
MCV: 99 fL (ref 79.5–101.0)
MONO#: 0.4 10*3/uL (ref 0.1–0.9)
MONO%: 10.8 % (ref 0.0–14.0)
NEUT#: 1.6 10*3/uL (ref 1.5–6.5)
NEUT%: 45.2 % (ref 38.4–76.8)
Platelets: 255 10*3/uL (ref 145–400)
RBC: 3.96 10*6/uL (ref 3.70–5.45)
RDW: 14.6 % — AB (ref 11.2–14.5)
WBC: 3.5 10*3/uL — ABNORMAL LOW (ref 3.9–10.3)

## 2013-07-14 LAB — COMPREHENSIVE METABOLIC PANEL (CC13)
ALT: 8 U/L (ref 0–55)
AST: 19 U/L (ref 5–34)
Albumin: 3.1 g/dL — ABNORMAL LOW (ref 3.5–5.0)
Alkaline Phosphatase: 75 U/L (ref 40–150)
Anion Gap: 7 mEq/L (ref 3–11)
BUN: 17.4 mg/dL (ref 7.0–26.0)
CO2: 22 mEq/L (ref 22–29)
CREATININE: 0.7 mg/dL (ref 0.6–1.1)
Calcium: 8.4 mg/dL (ref 8.4–10.4)
Chloride: 112 mEq/L — ABNORMAL HIGH (ref 98–109)
Glucose: 126 mg/dl (ref 70–140)
POTASSIUM: 3.7 meq/L (ref 3.5–5.1)
Sodium: 142 mEq/L (ref 136–145)
Total Bilirubin: 1.24 mg/dL — ABNORMAL HIGH (ref 0.20–1.20)
Total Protein: 5.8 g/dL — ABNORMAL LOW (ref 6.4–8.3)

## 2013-07-14 MED ORDER — SODIUM CHLORIDE 0.9 % IJ SOLN
10.0000 mL | INTRAMUSCULAR | Status: DC | PRN
  Administered 2013-07-14: 10 mL
  Filled 2013-07-14: qty 10

## 2013-07-14 MED ORDER — SODIUM CHLORIDE 0.9 % IV SOLN
500.0000 mg/m2 | Freq: Once | INTRAVENOUS | Status: AC
  Administered 2013-07-14: 900 mg via INTRAVENOUS
  Filled 2013-07-14: qty 36

## 2013-07-14 MED ORDER — DEXAMETHASONE SODIUM PHOSPHATE 20 MG/5ML IJ SOLN
INTRAMUSCULAR | Status: AC
  Filled 2013-07-14: qty 5

## 2013-07-14 MED ORDER — ONDANSETRON 8 MG/NS 50 ML IVPB
INTRAVENOUS | Status: AC
  Filled 2013-07-14: qty 8

## 2013-07-14 MED ORDER — SODIUM CHLORIDE 0.9 % IV SOLN
Freq: Once | INTRAVENOUS | Status: AC
  Administered 2013-07-14: 12:00:00 via INTRAVENOUS

## 2013-07-14 MED ORDER — HEPARIN SOD (PORK) LOCK FLUSH 100 UNIT/ML IV SOLN
500.0000 [IU] | Freq: Once | INTRAVENOUS | Status: AC | PRN
  Administered 2013-07-14: 500 [IU]
  Filled 2013-07-14: qty 5

## 2013-07-14 MED ORDER — DEXAMETHASONE SODIUM PHOSPHATE 10 MG/ML IJ SOLN
10.0000 mg | Freq: Once | INTRAMUSCULAR | Status: AC
  Administered 2013-07-14: 10 mg via INTRAVENOUS

## 2013-07-14 MED ORDER — ONDANSETRON 8 MG/50ML IVPB (CHCC)
8.0000 mg | Freq: Once | INTRAVENOUS | Status: AC
  Administered 2013-07-14: 8 mg via INTRAVENOUS

## 2013-07-14 NOTE — Progress Notes (Signed)
[place] Telephone:(336) [phone]   Fax:(336) [place], MD [place] [address]  Principle Diagnosis and stage: Recurrent non-small cell lung cancer, initially diagnosed a stage IB (T2, N0, M0) adenocarcinoma in June 2008 with a tumor size of 8 cm.   Prior Therapy:  #1 status post left upper lobectomy with lymph node dissection under the care of Dr. Arlyce Dice on 06/29/2006.  #2 status post 4 cycles of adjuvant chemotherapy with cisplatin and Taxotere last dose given 08/01/2006.  #3 status post 6 cycles of systemic chemotherapy with carboplatin and Alimta for disease recurrence last dose given 12/12/2009.   Current therapy: Maintenance chemotherapy with Alimta at 500 mg per meter squared given every 3 weeks status post 47 cycles.  CHEMOTHERAPY INTENT: Palliative/maintenance.  CURRENT # OF CHEMOTHERAPY CYCLES: 48  CURRENT ANTIEMETICS: Zofran, dexamethasone and Compazine  CURRENT SMOKING STATUS: Never Smoker.   ORAL CHEMOTHERAPY AND CONSENT: None  CURRENT BISPHOSPHONATES USE: None  PAIN MANAGEMENT: None  NARCOTICS INDUCED CONSTIPATION: None  LIVING WILL AND CODE STATUS: has living will for no CODE BLUE.  INTERVAL HISTORY: [name] 80 y.o. female returns to the clinic today for follow up visit. The patient is feeling fine today with no specific complaints. She is tolerating her maintenance chemotherapy with single agent Alimta fairly well except for mild fatigue for a few days after chemotherapy. The patient denied having any significant nausea or vomiting. She has no fever or chills. The she denied having any significant chest pain, shortness of breath, cough or hemoptysis. She has no weight loss or night sweats.  MEDICAL HISTORY: Past Medical History  Diagnosis Date  . lung ca dx'd 06/2006    rt and lt lung  . History of migraine headaches   . Hypercholesterolemia   . Lung cancer      ALLERGIES:  is allergic to simvastatin and iodinated diagnostic agents.  MEDICATIONS:  Current Outpatient Prescriptions  Medication Sig Dispense Refill  . Calcium Carbonate-Vit D-Min (CALTRATE PLUS PO) Take 500 mg by mouth 2 (two) times daily.        . folic acid (FOLVITE) 1 MG tablet Take 1 mg by mouth daily.        . Multiple Vitamins-Minerals (CENTRUM SILVER PO) Take 1 tablet by mouth daily.       . potassium gluconate 595 MG TABS Take 595 mg by mouth daily.      . prochlorperazine (COMPAZINE) 10 MG tablet Take 10 mg by mouth every 6 (six) hours as needed.        . ranitidine (ZANTAC) 75 MG tablet Take 75 mg by mouth. Pt takes as needed      . vitamin B-12 (CYANOCOBALAMIN) 500 MCG tablet Take 1,500 mcg by mouth daily.        No current facility-administered medications for this visit.    REVIEW OF SYSTEMS:  A comprehensive review of systems was negative except for: Constitutional: positive for fatigue   PHYSICAL EXAMINATION: General appearance: alert, cooperative and no distress Head: Normocephalic, without obvious abnormality, atraumatic Neck: no adenopathy Lymph nodes: Cervical, supraclavicular, and axillary nodes normal. Resp: clear to auscultation bilaterally Cardio: regular rate and rhythm, S1, S2 normal, no murmur, click, rub or gallop GI: soft, non-tender; bowel sounds normal; no masses,  no organomegaly Extremities: extremities normal, atraumatic, no cyanosis or edema  ECOG PERFORMANCE STATUS: 0 - Asymptomatic  Blood pressure 127/70, pulse 73, temperature 98 F (36.7  C), temperature source Oral, resp. rate 18, height 5\' 2"  (1.575 m), weight 149 lb 4.8 oz (67.722 kg), SpO2 100.00%.  LABORATORY DATA: Lab Results  Component Value Date   WBC 3.5* 07/14/2013   HGB 13.1 07/14/2013   HCT 39.2 07/14/2013   MCV 99.0 07/14/2013   PLT 255 07/14/2013      Chemistry      Component Value Date/Time   NA 141 06/23/2013 0956   NA 139 01/24/2012 1008   NA 140 08/21/2011 1306    K 3.6 06/23/2013 0956   K 4.2 01/24/2012 1008   K 4.1 08/21/2011 1306   CL 108* 11/04/2012 0947   CL 103 01/24/2012 1008   CL 107 08/21/2011 1306   CO2 23 06/23/2013 0956   CO2 28 01/24/2012 1008   CO2 22 08/21/2011 1306   BUN 14.1 06/23/2013 0956   BUN 13 01/24/2012 1008   BUN 11 08/21/2011 1306   CREATININE 0.7 06/23/2013 0956   CREATININE 0.8 01/24/2012 1008   CREATININE 0.81 08/21/2011 1306      Component Value Date/Time   CALCIUM 8.7 06/23/2013 0956   CALCIUM 8.6 01/24/2012 1008   CALCIUM 8.8 08/21/2011 1306   ALKPHOS 70 06/23/2013 0956   ALKPHOS 79 01/24/2012 1008   ALKPHOS 87 08/21/2011 1306   AST 20 06/23/2013 0956   AST 24 01/24/2012 1008   AST 17 08/21/2011 1306   ALT 8 06/23/2013 0956   ALT 21 01/24/2012 1008   ALT 8 08/21/2011 1306   BILITOT 0.63 06/23/2013 0956   BILITOT 0.90 01/24/2012 1008   BILITOT 0.6 08/21/2011 1306       RADIOGRAPHIC STUDIES:  ASSESSMENT AND PLAN: This is a very pleasant 80 years old white female with recurrent non-small cell lung cancer, adenocarcinoma. The patient is currently undergoing maintenance chemotherapy with single agent Alimta status post 46 cycles and she is tolerating it fairly well.  The patient is doing fine today. I recommended for the patient to proceed with her maintenance chemotherapy with single agent Alimta as scheduled. We'll proceed today with cycle #47 as scheduled. She would come back for follow up visit in 3 weeks with the start of the next cycle of her treatment after repeating CT scan of the chest for restaging of her disease. She was advised to call immediately if she has any concerning symptoms in the interval.  Disclaimer: This note was dictated with voice recognition software. Similar sounding words can inadvertently be transcribed and may not be corrected upon review.    [name]., MD 07/14/2013

## 2013-07-14 NOTE — Telephone Encounter (Signed)
gv and printed appt sched and avs for pt for March....sed added tx.

## 2013-07-14 NOTE — Patient Instructions (Signed)
[facility] Discharge Instructions for Patients Receiving Chemotherapy  Today you received the following chemotherapy agents ALIMNTA To help prevent nausea and vomiting after your treatment, we encourage you to take your nausea medication as prescribed.   If you develop nausea and vomiting that is not controlled by your nausea medication, call the clinic.   BELOW ARE SYMPTOMS THAT SHOULD BE REPORTED IMMEDIATELY:  *FEVER GREATER THAN 100.5 F  *CHILLS WITH OR WITHOUT FEVER  NAUSEA AND VOMITING THAT IS NOT CONTROLLED WITH YOUR NAUSEA MEDICATION  *UNUSUAL SHORTNESS OF BREATH  *UNUSUAL BRUISING OR BLEEDING  TENDERNESS IN MOUTH AND THROAT WITH OR WITHOUT PRESENCE OF ULCERS  *URINARY PROBLEMS  *BOWEL PROBLEMS  UNUSUAL RASH Items with * indicate a potential emergency and should be followed up as soon as possible.  Feel free to call the clinic you have any questions or concerns. The clinic phone number is [phone].

## 2013-07-28 ENCOUNTER — Ambulatory Visit (HOSPITAL_COMMUNITY): Payer: Medicare Other

## 2013-07-29 ENCOUNTER — Telehealth: Payer: Self-pay | Admitting: Medical Oncology

## 2013-07-29 NOTE — Telephone Encounter (Signed)
I spoke to daughter and Rinnah is going to reschedule. Note faxed to radiology.

## 2013-08-04 ENCOUNTER — Other Ambulatory Visit: Payer: Self-pay | Admitting: *Deleted

## 2013-08-04 ENCOUNTER — Ambulatory Visit (HOSPITAL_BASED_OUTPATIENT_CLINIC_OR_DEPARTMENT_OTHER): Payer: Medicare Other | Admitting: Internal Medicine

## 2013-08-04 ENCOUNTER — Telehealth: Payer: Self-pay | Admitting: Internal Medicine

## 2013-08-04 ENCOUNTER — Ambulatory Visit (HOSPITAL_BASED_OUTPATIENT_CLINIC_OR_DEPARTMENT_OTHER): Payer: Medicare Other

## 2013-08-04 ENCOUNTER — Other Ambulatory Visit (HOSPITAL_BASED_OUTPATIENT_CLINIC_OR_DEPARTMENT_OTHER): Payer: Medicare Other

## 2013-08-04 ENCOUNTER — Encounter: Payer: Self-pay | Admitting: Internal Medicine

## 2013-08-04 VITALS — BP 137/70 | HR 69 | Temp 98.0°F | Resp 19 | Ht 62.0 in | Wt 150.6 lb

## 2013-08-04 DIAGNOSIS — C3410 Malignant neoplasm of upper lobe, unspecified bronchus or lung: Secondary | ICD-10-CM

## 2013-08-04 DIAGNOSIS — R5383 Other fatigue: Secondary | ICD-10-CM

## 2013-08-04 DIAGNOSIS — R5381 Other malaise: Secondary | ICD-10-CM

## 2013-08-04 DIAGNOSIS — C3490 Malignant neoplasm of unspecified part of unspecified bronchus or lung: Secondary | ICD-10-CM

## 2013-08-04 DIAGNOSIS — Z5111 Encounter for antineoplastic chemotherapy: Secondary | ICD-10-CM

## 2013-08-04 LAB — CBC WITH DIFFERENTIAL/PLATELET
BASO%: 0.3 % (ref 0.0–2.0)
BASOS ABS: 0 10*3/uL (ref 0.0–0.1)
EOS ABS: 0.1 10*3/uL (ref 0.0–0.5)
EOS%: 1.9 % (ref 0.0–7.0)
HEMATOCRIT: 38.9 % (ref 34.8–46.6)
HEMOGLOBIN: 12.9 g/dL (ref 11.6–15.9)
LYMPH%: 49.6 % (ref 14.0–49.7)
MCH: 32.8 pg (ref 25.1–34.0)
MCHC: 33.2 g/dL (ref 31.5–36.0)
MCV: 99 fL (ref 79.5–101.0)
MONO#: 0.5 10*3/uL (ref 0.1–0.9)
MONO%: 13.9 % (ref 0.0–14.0)
NEUT#: 1.3 10*3/uL — ABNORMAL LOW (ref 1.5–6.5)
NEUT%: 34.3 % — AB (ref 38.4–76.8)
PLATELETS: 229 10*3/uL (ref 145–400)
RBC: 3.93 10*6/uL (ref 3.70–5.45)
RDW: 14.6 % — ABNORMAL HIGH (ref 11.2–14.5)
WBC: 3.8 10*3/uL — AB (ref 3.9–10.3)
lymph#: 1.9 10*3/uL (ref 0.9–3.3)
nRBC: 0 % (ref 0–0)

## 2013-08-04 LAB — COMPREHENSIVE METABOLIC PANEL (CC13)
ALK PHOS: 81 U/L (ref 40–150)
ALT: 11 U/L (ref 0–55)
AST: 23 U/L (ref 5–34)
Albumin: 3.4 g/dL — ABNORMAL LOW (ref 3.5–5.0)
Anion Gap: 9 mEq/L (ref 3–11)
BILIRUBIN TOTAL: 0.73 mg/dL (ref 0.20–1.20)
BUN: 8.2 mg/dL (ref 7.0–26.0)
CO2: 23 mEq/L (ref 22–29)
Calcium: 9.1 mg/dL (ref 8.4–10.4)
Chloride: 110 mEq/L — ABNORMAL HIGH (ref 98–109)
Creatinine: 0.7 mg/dL (ref 0.6–1.1)
GLUCOSE: 85 mg/dL (ref 70–140)
Potassium: 4.1 mEq/L (ref 3.5–5.1)
SODIUM: 142 meq/L (ref 136–145)
Total Protein: 6.5 g/dL (ref 6.4–8.3)

## 2013-08-04 MED ORDER — SODIUM CHLORIDE 0.9 % IJ SOLN
10.0000 mL | INTRAMUSCULAR | Status: DC | PRN
  Administered 2013-08-04: 10 mL
  Filled 2013-08-04: qty 10

## 2013-08-04 MED ORDER — DEXAMETHASONE SODIUM PHOSPHATE 10 MG/ML IJ SOLN
INTRAMUSCULAR | Status: AC
  Filled 2013-08-04: qty 1

## 2013-08-04 MED ORDER — ONDANSETRON 8 MG/NS 50 ML IVPB
INTRAVENOUS | Status: AC
  Filled 2013-08-04: qty 8

## 2013-08-04 MED ORDER — ONDANSETRON 8 MG/50ML IVPB (CHCC)
8.0000 mg | Freq: Once | INTRAVENOUS | Status: AC
  Administered 2013-08-04: 8 mg via INTRAVENOUS

## 2013-08-04 MED ORDER — DEXAMETHASONE SODIUM PHOSPHATE 10 MG/ML IJ SOLN
10.0000 mg | Freq: Once | INTRAMUSCULAR | Status: AC
  Administered 2013-08-04: 10 mg via INTRAVENOUS

## 2013-08-04 MED ORDER — HEPARIN SOD (PORK) LOCK FLUSH 100 UNIT/ML IV SOLN
500.0000 [IU] | Freq: Once | INTRAVENOUS | Status: AC | PRN
  Administered 2013-08-04: 500 [IU]
  Filled 2013-08-04: qty 5

## 2013-08-04 MED ORDER — SODIUM CHLORIDE 0.9 % IV SOLN
500.0000 mg/m2 | Freq: Once | INTRAVENOUS | Status: AC
  Administered 2013-08-04: 900 mg via INTRAVENOUS
  Filled 2013-08-04: qty 36

## 2013-08-04 MED ORDER — SODIUM CHLORIDE 0.9 % IV SOLN
Freq: Once | INTRAVENOUS | Status: AC
  Administered 2013-08-04: 10:00:00 via INTRAVENOUS

## 2013-08-04 NOTE — Progress Notes (Signed)
[facility] Telephone:(336) [phone]   Fax:(336) [place], MD [place] [address]  Principle Diagnosis and stage: Recurrent non-small cell lung cancer, initially diagnosed a stage IB (T2, N0, M0) adenocarcinoma in June 2008 with a tumor size of 8 cm.   Prior Therapy:  #1 status post left upper lobectomy with lymph node dissection under the care of Dr. Arlyce Dice on 06/29/2006.  #2 status post 4 cycles of adjuvant chemotherapy with cisplatin and Taxotere last dose given 08/01/2006.  #3 status post 6 cycles of systemic chemotherapy with carboplatin and Alimta for disease recurrence last dose given 12/12/2009.   Current therapy: Maintenance chemotherapy with Alimta at 500 mg per meter squared given every 3 weeks status post 49 cycles.  CHEMOTHERAPY INTENT: Palliative/maintenance.  CURRENT # OF CHEMOTHERAPY CYCLES: 50  CURRENT ANTIEMETICS: Zofran, dexamethasone and Compazine  CURRENT SMOKING STATUS: Never Smoker.   ORAL CHEMOTHERAPY AND CONSENT: None  CURRENT BISPHOSPHONATES USE: None  PAIN MANAGEMENT: None  NARCOTICS INDUCED CONSTIPATION: None  LIVING WILL AND CODE STATUS: has living will for no CODE BLUE.  INTERVAL HISTORY: [name] 80 y.o. female returns to the clinic today for follow up visit. The patient is feeling fine today with no specific complaints. She is tolerating her maintenance chemotherapy with single agent Alimta fairly well except for mild fatigue. The patient denied having any significant nausea or vomiting. She has no fever or chills. The she denied having any significant chest pain, shortness of breath, cough or hemoptysis. She has no weight loss or night sweats. She was supposed to have repeat CT scan of the chest on 07/29/2013 but this was canceled because of the weather condition at that time.  MEDICAL HISTORY: Past Medical History  Diagnosis Date  . lung ca dx'd 06/2006     rt and lt lung  . History of migraine headaches   . Hypercholesterolemia   . Lung cancer     ALLERGIES:  is allergic to simvastatin and iodinated diagnostic agents.  MEDICATIONS:  Current Outpatient Prescriptions  Medication Sig Dispense Refill  . Calcium Carbonate-Vit D-Min (CALTRATE PLUS PO) Take 500 mg by mouth 2 (two) times daily.        . folic acid (FOLVITE) 1 MG tablet Take 1 mg by mouth daily.        . Multiple Vitamins-Minerals (CENTRUM SILVER PO) Take 1 tablet by mouth daily.       . potassium gluconate 595 MG TABS Take 595 mg by mouth daily.      . prochlorperazine (COMPAZINE) 10 MG tablet Take 10 mg by mouth every 6 (six) hours as needed.        . ranitidine (ZANTAC) 75 MG tablet Take 75 mg by mouth. Pt takes as needed      . vitamin B-12 (CYANOCOBALAMIN) 500 MCG tablet Take 1,500 mcg by mouth daily.        No current facility-administered medications for this visit.    REVIEW OF SYSTEMS:  A comprehensive review of systems was negative except for: Constitutional: positive for fatigue   PHYSICAL EXAMINATION: General appearance: alert, cooperative and no distress Head: Normocephalic, without obvious abnormality, atraumatic Neck: no adenopathy Lymph nodes: Cervical, supraclavicular, and axillary nodes normal. Resp: clear to auscultation bilaterally Cardio: regular rate and rhythm, S1, S2 normal, no murmur, click, rub or gallop GI: soft, non-tender; bowel sounds normal; no masses,  no organomegaly Extremities: extremities normal, atraumatic, no cyanosis  or edema  ECOG PERFORMANCE STATUS: 0 - Asymptomatic  Blood pressure 137/70, pulse 69, temperature 98 F (36.7 C), temperature source Oral, resp. rate 19, height 5\' 2"  (1.575 m), weight 150 lb 9.6 oz (68.312 kg), SpO2 100.00%.  LABORATORY DATA: Lab Results  Component Value Date   WBC 3.8* 08/04/2013   HGB 12.9 08/04/2013   HCT 38.9 08/04/2013   MCV 99.0 08/04/2013   PLT 229 08/04/2013      Chemistry      Component  Value Date/Time   NA 142 07/14/2013 1020   NA 139 01/24/2012 1008   NA 140 08/21/2011 1306   K 3.7 07/14/2013 1020   K 4.2 01/24/2012 1008   K 4.1 08/21/2011 1306   CL 108* 11/04/2012 0947   CL 103 01/24/2012 1008   CL 107 08/21/2011 1306   CO2 22 07/14/2013 1020   CO2 28 01/24/2012 1008   CO2 22 08/21/2011 1306   BUN 17.4 07/14/2013 1020   BUN 13 01/24/2012 1008   BUN 11 08/21/2011 1306   CREATININE 0.7 07/14/2013 1020   CREATININE 0.8 01/24/2012 1008   CREATININE 0.81 08/21/2011 1306      Component Value Date/Time   CALCIUM 8.4 07/14/2013 1020   CALCIUM 8.6 01/24/2012 1008   CALCIUM 8.8 08/21/2011 1306   ALKPHOS 75 07/14/2013 1020   ALKPHOS 79 01/24/2012 1008   ALKPHOS 87 08/21/2011 1306   AST 19 07/14/2013 1020   AST 24 01/24/2012 1008   AST 17 08/21/2011 1306   ALT 8 07/14/2013 1020   ALT 21 01/24/2012 1008   ALT 8 08/21/2011 1306   BILITOT 1.24* 07/14/2013 1020   BILITOT 0.90 01/24/2012 1008   BILITOT 0.6 08/21/2011 1306       RADIOGRAPHIC STUDIES:  ASSESSMENT AND PLAN: This is a very pleasant 80 years old white female with recurrent non-small cell lung cancer, adenocarcinoma. The patient is currently undergoing maintenance chemotherapy with single agent Alimta status post 46 cycles and she is tolerating it fairly well.  The patient is doing fine today. I recommended for the patient to proceed with her maintenance chemotherapy with single agent Alimta as scheduled. We'll proceed today with cycle #49 as scheduled.  I will arrange for her to have repeat CT scan of the chest in the next few days for evaluation of her disease. She would come back for followup visit in 3 weeks with the next cycle of her chemotherapy. She was advised to call immediately if she has any concerning symptoms in the interval.  Disclaimer: This note was dictated with voice recognition software. Similar sounding words can inadvertently be transcribed and may not be corrected upon review.    [name].,  MD 08/04/2013

## 2013-08-04 NOTE — Telephone Encounter (Signed)
gv adn printed appt sched adn avs forpt for March adn April °

## 2013-08-04 NOTE — Progress Notes (Signed)
Pt ok to treat (ANC 1.3) per Dr. Earlie Server.

## 2013-08-04 NOTE — Patient Instructions (Signed)
[facility] Discharge Instructions for Patients Receiving Chemotherapy  Today you received the following chemotherapy agents alimta.   To help prevent nausea and vomiting after your treatment, we encourage you to take your nausea medication as directed.    If you develop nausea and vomiting that is not controlled by your nausea medication, call the clinic.   BELOW ARE SYMPTOMS THAT SHOULD BE REPORTED IMMEDIATELY:  *FEVER GREATER THAN 100.5 F  *CHILLS WITH OR WITHOUT FEVER  NAUSEA AND VOMITING THAT IS NOT CONTROLLED WITH YOUR NAUSEA MEDICATION  *UNUSUAL SHORTNESS OF BREATH  *UNUSUAL BRUISING OR BLEEDING  TENDERNESS IN MOUTH AND THROAT WITH OR WITHOUT PRESENCE OF ULCERS  *URINARY PROBLEMS  *BOWEL PROBLEMS  UNUSUAL RASH Items with * indicate a potential emergency and should be followed up as soon as possible.  Feel free to call the clinic you have any questions or concerns. The clinic phone number is [phone].

## 2013-08-06 ENCOUNTER — Ambulatory Visit (HOSPITAL_COMMUNITY): Payer: Medicare Other

## 2013-08-10 ENCOUNTER — Ambulatory Visit (HOSPITAL_COMMUNITY)
Admission: RE | Admit: 2013-08-10 | Discharge: 2013-08-10 | Disposition: A | Discharge status: Home / Self Care | Payer: Medicare Other | Source: Ambulatory Visit | Attending: Internal Medicine | Admitting: Internal Medicine

## 2013-08-10 ENCOUNTER — Encounter (HOSPITAL_COMMUNITY): Payer: Self-pay

## 2013-08-10 DIAGNOSIS — R918 Other nonspecific abnormal finding of lung field: Secondary | ICD-10-CM | POA: Insufficient documentation

## 2013-08-10 DIAGNOSIS — C3490 Malignant neoplasm of unspecified part of unspecified bronchus or lung: Secondary | ICD-10-CM

## 2013-08-10 DIAGNOSIS — Z85118 Personal history of other malignant neoplasm of bronchus and lung: Secondary | ICD-10-CM | POA: Insufficient documentation

## 2013-08-10 DIAGNOSIS — R059 Cough, unspecified: Secondary | ICD-10-CM | POA: Insufficient documentation

## 2013-08-10 DIAGNOSIS — Z902 Acquired absence of lung [part of]: Secondary | ICD-10-CM | POA: Insufficient documentation

## 2013-08-10 DIAGNOSIS — R05 Cough: Secondary | ICD-10-CM | POA: Insufficient documentation

## 2013-08-10 DIAGNOSIS — R222 Localized swelling, mass and lump, trunk: Secondary | ICD-10-CM | POA: Insufficient documentation

## 2013-08-18 ENCOUNTER — Telehealth: Payer: Self-pay | Admitting: *Deleted

## 2013-08-18 NOTE — Telephone Encounter (Signed)
Pt called wanting to know CT results.  Per Dr Vista Mink,  No changes in CT scan.  Called and informed pt, she verbalized understanding.  SLJ

## 2013-08-25 ENCOUNTER — Telehealth: Payer: Self-pay | Admitting: Internal Medicine

## 2013-08-25 ENCOUNTER — Ambulatory Visit (HOSPITAL_BASED_OUTPATIENT_CLINIC_OR_DEPARTMENT_OTHER): Payer: Medicare Other

## 2013-08-25 ENCOUNTER — Encounter: Payer: Self-pay | Admitting: Internal Medicine

## 2013-08-25 ENCOUNTER — Other Ambulatory Visit (HOSPITAL_BASED_OUTPATIENT_CLINIC_OR_DEPARTMENT_OTHER): Payer: Medicare Other

## 2013-08-25 ENCOUNTER — Ambulatory Visit (HOSPITAL_BASED_OUTPATIENT_CLINIC_OR_DEPARTMENT_OTHER): Payer: Medicare Other | Admitting: Internal Medicine

## 2013-08-25 VITALS — BP 141/64 | HR 65 | Temp 97.9°F | Resp 18 | Ht 62.0 in | Wt 151.2 lb

## 2013-08-25 DIAGNOSIS — Z5111 Encounter for antineoplastic chemotherapy: Secondary | ICD-10-CM

## 2013-08-25 DIAGNOSIS — C3490 Malignant neoplasm of unspecified part of unspecified bronchus or lung: Secondary | ICD-10-CM

## 2013-08-25 DIAGNOSIS — C3410 Malignant neoplasm of upper lobe, unspecified bronchus or lung: Secondary | ICD-10-CM

## 2013-08-25 DIAGNOSIS — C3491 Malignant neoplasm of unspecified part of right bronchus or lung: Secondary | ICD-10-CM

## 2013-08-25 LAB — CBC WITH DIFFERENTIAL/PLATELET
BASO%: 0.3 % (ref 0.0–2.0)
BASOS ABS: 0 10*3/uL (ref 0.0–0.1)
EOS ABS: 0.1 10*3/uL (ref 0.0–0.5)
EOS%: 1.6 % (ref 0.0–7.0)
HCT: 37.8 % (ref 34.8–46.6)
HEMOGLOBIN: 12.7 g/dL (ref 11.6–15.9)
LYMPH#: 1.3 10*3/uL (ref 0.9–3.3)
LYMPH%: 40.1 % (ref 14.0–49.7)
MCH: 33.2 pg (ref 25.1–34.0)
MCHC: 33.6 g/dL (ref 31.5–36.0)
MCV: 99 fL (ref 79.5–101.0)
MONO#: 0.5 10*3/uL (ref 0.1–0.9)
MONO%: 15.8 % — ABNORMAL HIGH (ref 0.0–14.0)
NEUT%: 42.2 % (ref 38.4–76.8)
NEUTROS ABS: 1.3 10*3/uL — AB (ref 1.5–6.5)
Platelets: 255 10*3/uL (ref 145–400)
RBC: 3.82 10*6/uL (ref 3.70–5.45)
RDW: 14.5 % (ref 11.2–14.5)
WBC: 3.2 10*3/uL — ABNORMAL LOW (ref 3.9–10.3)
nRBC: 0 % (ref 0–0)

## 2013-08-25 LAB — COMPREHENSIVE METABOLIC PANEL (CC13)
ALBUMIN: 3.4 g/dL — AB (ref 3.5–5.0)
ALK PHOS: 78 U/L (ref 40–150)
ALT: 11 U/L (ref 0–55)
AST: 25 U/L (ref 5–34)
Anion Gap: 11 mEq/L (ref 3–11)
BUN: 14.2 mg/dL (ref 7.0–26.0)
CHLORIDE: 111 meq/L — AB (ref 98–109)
CO2: 19 meq/L — AB (ref 22–29)
Calcium: 8.8 mg/dL (ref 8.4–10.4)
Creatinine: 0.8 mg/dL (ref 0.6–1.1)
Glucose: 109 mg/dl (ref 70–140)
Potassium: 3.8 mEq/L (ref 3.5–5.1)
SODIUM: 141 meq/L (ref 136–145)
Total Bilirubin: 0.6 mg/dL (ref 0.20–1.20)
Total Protein: 6.6 g/dL (ref 6.4–8.3)

## 2013-08-25 MED ORDER — ONDANSETRON 8 MG/50ML IVPB (CHCC)
8.0000 mg | Freq: Once | INTRAVENOUS | Status: AC
  Administered 2013-08-25: 8 mg via INTRAVENOUS

## 2013-08-25 MED ORDER — SODIUM CHLORIDE 0.9 % IV SOLN: Freq: Once | INTRAVENOUS | Status: DC

## 2013-08-25 MED ORDER — ONDANSETRON 8 MG/NS 50 ML IVPB
INTRAVENOUS | Status: AC
  Filled 2013-08-25: qty 8

## 2013-08-25 MED ORDER — DEXAMETHASONE SODIUM PHOSPHATE 10 MG/ML IJ SOLN
10.0000 mg | Freq: Once | INTRAMUSCULAR | Status: AC
  Administered 2013-08-25: 10 mg via INTRAVENOUS

## 2013-08-25 MED ORDER — CYANOCOBALAMIN 1000 MCG/ML IJ SOLN
1000.0000 ug | Freq: Once | INTRAMUSCULAR | Status: AC
  Administered 2013-08-25: 1000 ug via INTRAMUSCULAR

## 2013-08-25 MED ORDER — DEXAMETHASONE SODIUM PHOSPHATE 10 MG/ML IJ SOLN
INTRAMUSCULAR | Status: AC
  Filled 2013-08-25: qty 1

## 2013-08-25 MED ORDER — CYANOCOBALAMIN 1000 MCG/ML IJ SOLN
INTRAMUSCULAR | Status: AC
  Filled 2013-08-25: qty 1

## 2013-08-25 MED ORDER — SODIUM CHLORIDE 0.9 % IV SOLN
500.0000 mg/m2 | Freq: Once | INTRAVENOUS | Status: AC
  Administered 2013-08-25: 900 mg via INTRAVENOUS
  Filled 2013-08-25: qty 36

## 2013-08-25 NOTE — Progress Notes (Signed)
[place] Telephone:([phone]   [phone]  VISIT PROGRESS NOTE  [name],[name], MD [place] [address]  Principle Diagnosis and stage: Recurrent non-small cell lung cancer, initially diagnosed a stage IB (T2, N0, M0) adenocarcinoma in June 2008 with a tumor size of 8 cm.   Prior Therapy:  #1 status post left upper lobectomy with lymph node dissection under the care of Dr. Arlyce Dice on 06/29/2006.  #2 status post 4 cycles of adjuvant chemotherapy with cisplatin and Taxotere last dose given 08/01/2006.  #3 status post 6 cycles of systemic chemotherapy with carboplatin and Alimta for disease recurrence last dose given 12/12/2009.   Current therapy: Maintenance chemotherapy with Alimta at 500 mg per meter squared given every 3 weeks status post 50 cycles.  CHEMOTHERAPY INTENT: Palliative/maintenance.  CURRENT # OF CHEMOTHERAPY CYCLES: 51  CURRENT ANTIEMETICS: Zofran, dexamethasone and Compazine  CURRENT SMOKING STATUS: Never Smoker.   ORAL CHEMOTHERAPY AND CONSENT: None  CURRENT BISPHOSPHONATES USE: None  PAIN MANAGEMENT: None  NARCOTICS INDUCED CONSTIPATION: None  LIVING WILL AND CODE STATUS: has living will for no CODE BLUE.  INTERVAL HISTORY: [name] 80 y.o. female returns to the clinic today for follow up visit. The patient is feeling fine today with no specific complaints. She is tolerating her maintenance chemotherapy with single agent Alimta fairly well except for mild fatigue. The patient denied having any significant nausea or vomiting. She has no fever or chills. The she denied having any significant chest pain, shortness of breath, cough or hemoptysis. She has no weight loss or night sweats. She had repeat CT scan of the chest performed recently and she is here for evaluation and discussion of her scan results before starting the next cycle of her chemotherapy.  MEDICAL HISTORY: Past Medical History    Diagnosis Date  . lung ca dx'd 06/2006    rt and lt lung  . History of migraine headaches   . Hypercholesterolemia   . Lung cancer     ALLERGIES:  is allergic to simvastatin and iodinated diagnostic agents.  MEDICATIONS:  Current Outpatient Prescriptions  Medication Sig Dispense Refill  . Calcium Carbonate-Vit D-Min (CALTRATE PLUS PO) Take 500 mg by mouth 2 (two) times daily.        . folic acid (FOLVITE) 1 MG tablet Take 1 mg by mouth daily.        . Multiple Vitamins-Minerals (CENTRUM SILVER PO) Take 1 tablet by mouth daily.       . potassium gluconate 595 MG TABS Take 595 mg by mouth daily.      . prochlorperazine (COMPAZINE) 10 MG tablet Take 10 mg by mouth every 6 (six) hours as needed.        . ranitidine (ZANTAC) 75 MG tablet Take 75 mg by mouth. Pt takes as needed      . vitamin B-12 (CYANOCOBALAMIN) 500 MCG tablet Take 1,500 mcg by mouth daily.        No current facility-administered medications for this visit.    REVIEW OF SYSTEMS:  Constitutional: positive for fatigue Eyes: negative Ears, nose, mouth, throat, and face: negative Respiratory: negative Cardiovascular: negative Gastrointestinal: negative Genitourinary:negative Integument/breast: negative Hematologic/lymphatic: negative Musculoskeletal:negative Neurological: negative Behavioral/Psych: negative Endocrine: negative Allergic/Immunologic: negative   PHYSICAL EXAMINATION: General appearance: alert, cooperative and no distress Head: Normocephalic, without obvious abnormality, atraumatic Neck: no adenopathy Lymph nodes: Cervical, supraclavicular, and axillary nodes normal. Resp: clear to auscultation bilaterally Back: symmetric, no curvature.  ROM normal. No CVA tenderness. Cardio: regular rate and rhythm, S1, S2 normal, no murmur, click, rub or gallop GI: soft, non-tender; bowel sounds normal; no masses,  no organomegaly Extremities: extremities normal, atraumatic, no cyanosis or edema Neurologic:  Alert and oriented X 3, normal strength and tone. Normal symmetric reflexes. Normal coordination and gait  ECOG PERFORMANCE STATUS: 0 - Asymptomatic  Blood pressure 141/64, pulse 65, temperature 97.9 F (36.6 C), temperature source Oral, resp. rate 18, height 5\' 2"  (1.575 m), weight 151 lb 3.2 oz (68.584 kg), SpO2 100.00%.  LABORATORY DATA: Lab Results  Component Value Date   WBC 3.2* 08/25/2013   HGB 12.7 08/25/2013   HCT 37.8 08/25/2013   MCV 99.0 08/25/2013   PLT 255 08/25/2013      Chemistry      Component Value Date/Time   NA 142 08/04/2013 0901   NA 139 01/24/2012 1008   NA 140 08/21/2011 1306   K 4.1 08/04/2013 0901   K 4.2 01/24/2012 1008   K 4.1 08/21/2011 1306   CL 108* 11/04/2012 0947   CL 103 01/24/2012 1008   CL 107 08/21/2011 1306   CO2 23 08/04/2013 0901   CO2 28 01/24/2012 1008   CO2 22 08/21/2011 1306   BUN 8.2 08/04/2013 0901   BUN 13 01/24/2012 1008   BUN 11 08/21/2011 1306   CREATININE 0.7 08/04/2013 0901   CREATININE 0.8 01/24/2012 1008   CREATININE 0.81 08/21/2011 1306      Component Value Date/Time   CALCIUM 9.1 08/04/2013 0901   CALCIUM 8.6 01/24/2012 1008   CALCIUM 8.8 08/21/2011 1306   ALKPHOS 81 08/04/2013 0901   ALKPHOS 79 01/24/2012 1008   ALKPHOS 87 08/21/2011 1306   AST 23 08/04/2013 0901   AST 24 01/24/2012 1008   AST 17 08/21/2011 1306   ALT 11 08/04/2013 0901   ALT 21 01/24/2012 1008   ALT 8 08/21/2011 1306   BILITOT 0.73 08/04/2013 0901   BILITOT 0.90 01/24/2012 1008   BILITOT 0.6 08/21/2011 1306       RADIOGRAPHIC STUDIES: Ct Chest Wo Contrast  08/10/2013   CLINICAL DATA History of lung carcinoma. Chemotherapy and progress. History of left upper lobectomy. Cough.  EXAM CT CHEST WITHOUT CONTRAST  TECHNIQUE Multidetector CT imaging of the chest was performed following the standard protocol without IV contrast.  COMPARISON CT, 05/11/2013  FINDINGS Since the prior study, the right upper lobe mass, which lies adjacent to the minor fissure, has not changed significantly in  size. It measures 2 cm in diameter on transverse imaging and 18 mm from superior to inferior. Is contiguous with less well-defined opacity that extends towards the right lateral pleural margin. Additional pass extends along the minor fissure, which is all plaque-like, and also unchanged. There is a focal triangular-shaped opacity in the left upper lobe adjacent to the minor fissure that measures 10 mm x 7 mm, unchanged.  There is a 4 mm subpleural nodule in the right lower lobe that is stable. Is slightly smaller subpleural nodule is noted in the left upper lobe, also unchanged there is a focal area of coarse reticular opacity in the anterior left upper lobe measuring 14.8 mm in greatest transverse dimension. This is stable. Other milder areas of reticular opacity are also stable, most evident in the posterior left lower lobe, likely scarring.  There are no new lung nodules or focal opacities. No pleural effusion.  Heart is normal in size and configuration. Prominence of the ascending aorta is  stable. There are dense coronary artery calcifications that are stable. There are no neck base, axillary, mediastinal or hilar masses or pathologically enlarged lymph nodes.  There is no evidence of metastatic disease of the included upper abdomen.  There are no osteoblastic or osteolytic lesions.  Right anterior chest wall Port-A-Cath has its catheter tip just above the caval atrial junction.  IMPRESSION 1. No change from the prior study. 2. Primary right upper lobe mass, which lies adjacent to the minor fissure, is stable in size. 3. Other areas of irregular opacity along the minor fissure in the inferior right upper lobe are also stable. There are 2 small subpleural stable nodules, 1 in the right lower lobe in the other in the left upper lobe. There are other areas of stable reticular scarring. 4. No evidence of metastatic adenopathy or metastatic disease elsewhere.  SIGNATURE  Electronically Signed   By: Lajean Manes M.D.    On: 08/10/2013 16:24   ASSESSMENT AND PLAN: This is a very pleasant 80 years old white female with recurrent non-small cell lung cancer, adenocarcinoma. The patient is currently undergoing maintenance chemotherapy with single agent Alimta status post 50 cycles and she is tolerating it fairly well.  The patient is doing fine today. Her recent CT scan of the chest showed no evidence for disease progression. I discussed the scan results and showed the images to the patient today. I recommended for the patient to proceed with her maintenance chemotherapy with single agent Alimta as scheduled. We'll proceed today with cycle #51 as scheduled.  She would come back for followup visit in 3 weeks with the next cycle of her chemotherapy. She was advised to call immediately if she has any concerning symptoms in the interval. I spent 15 minutes of face-to-face counseling with the patient today out of the 25 minutes visit time. Disclaimer: This note was dictated with voice recognition software. Similar sounding words can inadvertently be transcribed and may not be corrected upon review.    [name]., MD 08/25/2013

## 2013-08-25 NOTE — Progress Notes (Signed)
Per Dr. Julien Nordmann- ok to treat with [place] 1.3.

## 2013-08-25 NOTE — Patient Instructions (Signed)
[facility] [place] Discharge Instructions for Patients Receiving Chemotherapy  Today you received the following chemotherapy agents: Alimta. To help prevent nausea and vomiting after your treatment, we encourage you to take your nausea medication.    If you develop nausea and vomiting that is not controlled by your nausea medication, call the clinic.   BELOW ARE SYMPTOMS THAT SHOULD BE REPORTED IMMEDIATELY:  *FEVER GREATER THAN 100.5 F  *CHILLS WITH OR WITHOUT FEVER  NAUSEA AND VOMITING THAT IS NOT CONTROLLED WITH YOUR NAUSEA MEDICATION  *UNUSUAL SHORTNESS OF BREATH  *UNUSUAL BRUISING OR BLEEDING  TENDERNESS IN MOUTH AND THROAT WITH OR WITHOUT PRESENCE OF ULCERS  *URINARY PROBLEMS  *BOWEL PROBLEMS  UNUSUAL RASH Items with * indicate a potential emergency and should be followed up as soon as possible.  Feel free to call the clinic you have any questions or concerns. The clinic phone number is [phone].

## 2013-08-25 NOTE — Telephone Encounter (Signed)
gv adn printed appt sched and avs for pt for April and May....sed added tx.

## 2013-09-13 ENCOUNTER — Other Ambulatory Visit: Payer: Self-pay | Admitting: Medical Oncology

## 2013-09-13 ENCOUNTER — Telehealth: Payer: Self-pay | Admitting: Medical Oncology

## 2013-09-13 NOTE — Telephone Encounter (Signed)
Called to cancel chemo on the 4/15. Recent sinusitis and  finished antibiotics today. Ears " still stopped up" . She is going to see ENT about her ears. I told her to keep next appointment and I will notify Dr Julien Nordmann.

## 2013-09-15 ENCOUNTER — Ambulatory Visit: Payer: Medicare Other

## 2013-09-15 ENCOUNTER — Other Ambulatory Visit: Payer: Medicare Other

## 2013-09-15 ENCOUNTER — Ambulatory Visit: Payer: Medicare Other | Admitting: Internal Medicine

## 2013-09-15 ENCOUNTER — Other Ambulatory Visit: Payer: Self-pay | Admitting: *Deleted

## 2013-09-16 ENCOUNTER — Telehealth: Payer: Self-pay | Admitting: Internal Medicine

## 2013-09-16 NOTE — Telephone Encounter (Signed)
s.w. pt and advised on May appt....pt ok adn aware

## 2013-10-06 ENCOUNTER — Ambulatory Visit (HOSPITAL_BASED_OUTPATIENT_CLINIC_OR_DEPARTMENT_OTHER): Payer: Medicare Other

## 2013-10-06 ENCOUNTER — Ambulatory Visit (HOSPITAL_BASED_OUTPATIENT_CLINIC_OR_DEPARTMENT_OTHER): Payer: Medicare Other | Admitting: Physician Assistant

## 2013-10-06 ENCOUNTER — Encounter: Payer: Self-pay | Admitting: Physician Assistant

## 2013-10-06 ENCOUNTER — Telehealth: Payer: Self-pay | Admitting: *Deleted

## 2013-10-06 ENCOUNTER — Other Ambulatory Visit (HOSPITAL_BASED_OUTPATIENT_CLINIC_OR_DEPARTMENT_OTHER): Payer: Medicare Other

## 2013-10-06 ENCOUNTER — Encounter: Payer: Self-pay | Admitting: Internal Medicine

## 2013-10-06 ENCOUNTER — Other Ambulatory Visit: Payer: Medicare Other

## 2013-10-06 ENCOUNTER — Telehealth: Payer: Self-pay | Admitting: Internal Medicine

## 2013-10-06 ENCOUNTER — Ambulatory Visit: Payer: Medicare Other

## 2013-10-06 VITALS — BP 155/69 | HR 67 | Temp 98.1°F | Resp 17 | Ht 62.0 in | Wt 148.9 lb

## 2013-10-06 DIAGNOSIS — Z5111 Encounter for antineoplastic chemotherapy: Secondary | ICD-10-CM

## 2013-10-06 DIAGNOSIS — C3490 Malignant neoplasm of unspecified part of unspecified bronchus or lung: Secondary | ICD-10-CM

## 2013-10-06 DIAGNOSIS — C3430 Malignant neoplasm of lower lobe, unspecified bronchus or lung: Secondary | ICD-10-CM

## 2013-10-06 DIAGNOSIS — C3491 Malignant neoplasm of unspecified part of right bronchus or lung: Secondary | ICD-10-CM

## 2013-10-06 LAB — CBC WITH DIFFERENTIAL/PLATELET
BASO%: 1 % (ref 0.0–2.0)
Basophils Absolute: 0 10*3/uL (ref 0.0–0.1)
EOS%: 2.4 % (ref 0.0–7.0)
Eosinophils Absolute: 0.1 10*3/uL (ref 0.0–0.5)
HEMATOCRIT: 39.3 % (ref 34.8–46.6)
HGB: 12.9 g/dL (ref 11.6–15.9)
LYMPH%: 46.4 % (ref 14.0–49.7)
MCH: 32.9 pg (ref 25.1–34.0)
MCHC: 32.9 g/dL (ref 31.5–36.0)
MCV: 100 fL (ref 79.5–101.0)
MONO#: 0.3 10*3/uL (ref 0.1–0.9)
MONO%: 10.3 % (ref 0.0–14.0)
NEUT#: 1.2 10*3/uL — ABNORMAL LOW (ref 1.5–6.5)
NEUT%: 39.9 % (ref 38.4–76.8)
Platelets: 195 10*3/uL (ref 145–400)
RBC: 3.93 10*6/uL (ref 3.70–5.45)
RDW: 13.8 % (ref 11.2–14.5)
WBC: 2.9 10*3/uL — ABNORMAL LOW (ref 3.9–10.3)
lymph#: 1.4 10*3/uL (ref 0.9–3.3)

## 2013-10-06 LAB — COMPREHENSIVE METABOLIC PANEL (CC13)
ALK PHOS: 88 U/L (ref 40–150)
ALT: 9 U/L (ref 0–55)
AST: 22 U/L (ref 5–34)
Albumin: 3.4 g/dL — ABNORMAL LOW (ref 3.5–5.0)
Anion Gap: 7 mEq/L (ref 3–11)
BUN: 8.2 mg/dL (ref 7.0–26.0)
CO2: 23 mEq/L (ref 22–29)
CREATININE: 0.8 mg/dL (ref 0.6–1.1)
Calcium: 9.2 mg/dL (ref 8.4–10.4)
Chloride: 111 mEq/L — ABNORMAL HIGH (ref 98–109)
Glucose: 83 mg/dl (ref 70–140)
Potassium: 3.9 mEq/L (ref 3.5–5.1)
Sodium: 141 mEq/L (ref 136–145)
Total Bilirubin: 0.65 mg/dL (ref 0.20–1.20)
Total Protein: 6.7 g/dL (ref 6.4–8.3)

## 2013-10-06 MED ORDER — ONDANSETRON 8 MG/NS 50 ML IVPB
INTRAVENOUS | Status: AC
  Filled 2013-10-06: qty 8

## 2013-10-06 MED ORDER — SODIUM CHLORIDE 0.9 % IV SOLN
Freq: Once | INTRAVENOUS | Status: AC
  Administered 2013-10-06: 12:00:00 via INTRAVENOUS

## 2013-10-06 MED ORDER — SODIUM CHLORIDE 0.9 % IV SOLN
400.0000 mg/m2 | Freq: Once | INTRAVENOUS | Status: AC
  Administered 2013-10-06: 700 mg via INTRAVENOUS
  Filled 2013-10-06: qty 28

## 2013-10-06 MED ORDER — SODIUM CHLORIDE 0.9 % IJ SOLN
10.0000 mL | INTRAMUSCULAR | Status: DC | PRN
  Administered 2013-10-06: 10 mL
  Filled 2013-10-06: qty 10

## 2013-10-06 MED ORDER — DEXAMETHASONE SODIUM PHOSPHATE 10 MG/ML IJ SOLN
INTRAMUSCULAR | Status: AC
  Filled 2013-10-06: qty 1

## 2013-10-06 MED ORDER — ONDANSETRON 8 MG/50ML IVPB (CHCC)
8.0000 mg | Freq: Once | INTRAVENOUS | Status: AC
  Administered 2013-10-06: 8 mg via INTRAVENOUS

## 2013-10-06 MED ORDER — HEPARIN SOD (PORK) LOCK FLUSH 100 UNIT/ML IV SOLN
500.0000 [IU] | Freq: Once | INTRAVENOUS | Status: AC | PRN
  Administered 2013-10-06: 500 [IU]
  Filled 2013-10-06: qty 5

## 2013-10-06 MED ORDER — DEXAMETHASONE SODIUM PHOSPHATE 10 MG/ML IJ SOLN
10.0000 mg | Freq: Once | INTRAMUSCULAR | Status: AC
  Administered 2013-10-06: 10 mg via INTRAVENOUS

## 2013-10-06 NOTE — Patient Instructions (Signed)
[place] Discharge Instructions for Patients Receiving Chemotherapy  Today you received the following chemotherapy agent: Alimta   To help prevent nausea and vomiting after your treatment, we encourage you to take your nausea medication as prescribed.    If you develop nausea and vomiting that is not controlled by your nausea medication, call the clinic.   BELOW ARE SYMPTOMS THAT SHOULD BE REPORTED IMMEDIATELY:  *FEVER GREATER THAN 100.5 F  *CHILLS WITH OR WITHOUT FEVER  NAUSEA AND VOMITING THAT IS NOT CONTROLLED WITH YOUR NAUSEA MEDICATION  *UNUSUAL SHORTNESS OF BREATH  *UNUSUAL BRUISING OR BLEEDING  TENDERNESS IN MOUTH AND THROAT WITH OR WITHOUT PRESENCE OF ULCERS  *URINARY PROBLEMS  *BOWEL PROBLEMS  UNUSUAL RASH Items with * indicate a potential emergency and should be followed up as soon as possible.  Feel free to call the clinic you have any questions or concerns. The clinic phone number is [phone].

## 2013-10-06 NOTE — Telephone Encounter (Signed)
I have adjusted 5/27

## 2013-10-06 NOTE — Patient Instructions (Signed)
Followup in 3 weeks prior to next scheduled cycle of maintenance chemotherapy

## 2013-10-06 NOTE — Progress Notes (Addendum)
[place] [phone]   [phone]  VISIT PROGRESS NOTE  [name],[name], MD [place] [address]  Principle Diagnosis and stage: Recurrent non-small cell lung cancer, initially diagnosed a stage IB (T2, N0, M0) adenocarcinoma in June 2008 with a tumor size of 8 cm.   Prior Therapy:  #1 status post left upper lobectomy with lymph node dissection under the care of Dr. Arlyce Dice on 06/29/2006.  #2 status post 4 cycles of adjuvant chemotherapy with cisplatin and Taxotere last dose given 08/01/2006.  #3 status post 6 cycles of systemic chemotherapy with carboplatin and Alimta for disease recurrence last dose given 12/12/2009.   Current therapy: Maintenance chemotherapy with Alimta at 500 mg per meter squared given every 3 weeks status post 51 cycles.  CHEMOTHERAPY INTENT: Palliative/maintenance.  CURRENT # OF CHEMOTHERAPY CYCLES: 52  CURRENT ANTIEMETICS: Zofran, dexamethasone and Compazine  CURRENT SMOKING STATUS: Never Smoker.   ORAL CHEMOTHERAPY AND CONSENT: None  CURRENT BISPHOSPHONATES USE: None  PAIN MANAGEMENT: None  NARCOTICS INDUCED CONSTIPATION: None  LIVING WILL AND CODE STATUS: has living will for no CODE BLUE.  INTERVAL HISTORY: [name] 80 y.o. female returns to the clinic today for follow up visit. The patient is feeling fine today with no specific complaints. She missed her last cycle of chemotherapy due to an acute sinus infection. She had significant fluid behind her ear and had to have a myringotomy. Overall she has been tolerating her maintenance chemotherapy with single agent Alimta relatively well with the exception of fatigue. The patient denied having any significant nausea or vomiting. She has no fever or chills. The she denied having any significant chest pain, shortness of breath, cough or hemoptysis. She has no weight loss or night sweats.    MEDICAL HISTORY: Past Medical History   Diagnosis Date  . lung ca dx'd 06/2006    rt and lt lung  . History of migraine headaches   . Hypercholesterolemia   . Lung cancer     ALLERGIES:  is allergic to simvastatin and iodinated diagnostic agents.  MEDICATIONS:  Current Outpatient Prescriptions  Medication Sig Dispense Refill  . Calcium Carbonate-Vit D-Min (CALTRATE PLUS PO) Take 500 mg by mouth 2 (two) times daily.        . folic acid (FOLVITE) 1 MG tablet Take 1 mg by mouth daily.        . Multiple Vitamins-Minerals (CENTRUM SILVER PO) Take 1 tablet by mouth daily.       . potassium gluconate 595 MG TABS Take 595 mg by mouth daily.      . ranitidine (ZANTAC) 75 MG tablet Take 75 mg by mouth. Pt takes as needed      . vitamin B-12 (CYANOCOBALAMIN) 500 MCG tablet Take 1,500 mcg by mouth daily.       . prochlorperazine (COMPAZINE) 10 MG tablet Take 10 mg by mouth every 6 (six) hours as needed.         No current facility-administered medications for this visit.    REVIEW OF SYSTEMS:  Constitutional: positive for fatigue Eyes: negative Ears, nose, mouth, throat, and face: negative Respiratory: negative Cardiovascular: negative Gastrointestinal: negative Genitourinary:negative Integument/breast: negative Hematologic/lymphatic: negative Musculoskeletal:negative Neurological: negative Behavioral/Psych: negative Endocrine: negative Allergic/Immunologic: negative   PHYSICAL EXAMINATION: General appearance: alert, cooperative and no distress Head: Normocephalic, without obvious abnormality, atraumatic Neck: no adenopathy Lymph nodes: Cervical, supraclavicular, and axillary nodes normal. Resp: clear to auscultation bilaterally Back: symmetric, no curvature.  ROM normal. No CVA tenderness. Cardio: regular rate and rhythm, S1, S2 normal, no murmur, click, rub or gallop GI: soft, non-tender; bowel sounds normal; no masses,  no organomegaly Extremities: extremities normal, atraumatic, no cyanosis or edema Neurologic:  Alert and oriented X 3, normal strength and tone. Normal symmetric reflexes. Normal coordination and gait  ECOG PERFORMANCE STATUS: 0 - Asymptomatic  Blood pressure 155/69, pulse 67, temperature 98.1 F (36.7 C), temperature source Oral, resp. rate 17, height _0  (1.575 m), weight 148 lb 14.4 oz (67.541 kg), SpO2 100.00%.  LABORATORY DATA: Lab Results  Component Value Date   WBC 2.9* 10/06/2013   HGB 12.9 10/06/2013   HCT 39.3 10/06/2013   MCV 100.0 10/06/2013   PLT 195 10/06/2013      Chemistry      Component Value Date/Time   NA 141 10/06/2013 1007   NA 139 01/24/2012 1008   NA 140 08/21/2011 1306   K 3.9 10/06/2013 1007   K 4.2 01/24/2012 1008   K 4.1 08/21/2011 1306   CL 108* 11/04/2012 0947   CL 103 01/24/2012 1008   CL 107 08/21/2011 1306   CO2 23 10/06/2013 1007   CO2 28 01/24/2012 1008   CO2 22 08/21/2011 1306   BUN 8.2 10/06/2013 1007   BUN 13 01/24/2012 1008   BUN 11 08/21/2011 1306   CREATININE 0.8 10/06/2013 1007   CREATININE 0.8 01/24/2012 1008   CREATININE 0.81 08/21/2011 1306      Component Value Date/Time   CALCIUM 9.2 10/06/2013 1007   CALCIUM 8.6 01/24/2012 1008   CALCIUM 8.8 08/21/2011 1306   ALKPHOS 88 10/06/2013 1007   ALKPHOS 79 01/24/2012 1008   ALKPHOS 87 08/21/2011 1306   AST 22 10/06/2013 1007   AST 24 01/24/2012 1008   AST 17 08/21/2011 1306   ALT 9 10/06/2013 1007   ALT 21 01/24/2012 1008   ALT 8 08/21/2011 1306   BILITOT 0.65 10/06/2013 1007   BILITOT 0.90 01/24/2012 1008   BILITOT 0.6 08/21/2011 1306       RADIOGRAPHIC STUDIES: Ct Chest Wo Contrast  08/10/2013   CLINICAL DATA History of lung carcinoma. Chemotherapy and progress. History of left upper lobectomy. Cough.  EXAM CT CHEST WITHOUT CONTRAST  TECHNIQUE Multidetector CT imaging of the chest was performed following the standard protocol without IV contrast.  COMPARISON CT, 05/11/2013  FINDINGS Since the prior study, the right upper lobe mass, which lies adjacent to the minor fissure, has not changed significantly in size.  It measures 2 cm in diameter on transverse imaging and 18 mm from superior to inferior. Is contiguous with less well-defined opacity that extends towards the right lateral pleural margin. Additional pass extends along the minor fissure, which is all plaque-like, and also unchanged. There is a focal triangular-shaped opacity in the left upper lobe adjacent to the minor fissure that measures 10 mm x 7 mm, unchanged.  There is a 4 mm subpleural nodule in the right lower lobe that is stable. Is slightly smaller subpleural nodule is noted in the left upper lobe, also unchanged there is a focal area of coarse reticular opacity in the anterior left upper lobe measuring 14.8 mm in greatest transverse dimension. This is stable. Other milder areas of reticular opacity are also stable, most evident in the posterior left lower lobe, likely scarring.  There are no new lung nodules or focal opacities. No pleural effusion.  Heart is normal in size and configuration. Prominence of the ascending aorta is  stable. There are dense coronary artery calcifications that are stable. There are no neck base, axillary, mediastinal or hilar masses or pathologically enlarged lymph nodes.  There is no evidence of metastatic disease of the included upper abdomen.  There are no osteoblastic or osteolytic lesions.  Right anterior chest wall Port-A-Cath has its catheter tip just above the caval atrial junction.  IMPRESSION 1. No change from the prior study. 2. Primary right upper lobe mass, which lies adjacent to the minor fissure, is stable in size. 3. Other areas of irregular opacity along the minor fissure in the inferior right upper lobe are also stable. There are 2 small subpleural stable nodules, 1 in the right lower lobe in the other in the left upper lobe. There are other areas of stable reticular scarring. 4. No evidence of metastatic adenopathy or metastatic disease elsewhere.  SIGNATURE  Electronically Signed   By: Lajean Manes M.D.   On:  08/10/2013 16:24   ASSESSMENT AND PLAN: This is a very pleasant 80 years old white female with recurrent non-small cell lung cancer, adenocarcinoma. The patient is currently undergoing maintenance chemotherapy with single agent Alimta status post 50 cycles and she is tolerating it fairly well.  The patient is doing fine today. Her recent CT scan of the chest showed no evidence for disease progression. The patient was discussed with her and also seen by Dr. Julien Nordmann. Her ANC is slightly low today at 1.2, we will proceed with her single agent Alimta however we will dose reduce to 400 mg per meter squared for cycle #52 today. She will followup in 3 weeks with a repeat CBC differential and C. met prior to cycle #53 of her maintenance chemotherapy with Alimta.  She was advised to call immediately if she has any concerning symptoms in the interval.  Carlton Adam, [name]   ADDENDUM: Hematology/Oncology Attending:  I had a face to face encounter with the patient today. I recommended his care plan. This is a very pleasant 80 years old white female with recurrent non-small cell lung cancer, adenocarcinoma. The patient is currently undergoing maintenance chemotherapy with single agent Alimta status post 51 cycles and tolerating her treatment fairly well. She missed her last cycle of her treatment because of sinus infection and fluid behind her ear status post myringotomy.  She is feeling much better today and ready to resume her systemic treatment. Her absolute neutrophil count is low today. I would reduce the dose of Alimta at 400 mg/M2 for this cycle.  She will come back for followup visit in 3 weeks with the start of the next cycle of her treatment. She was advised to call immediately if she has any concerning symptoms in the interval.  Disclaimer: This note was dictated with voice recognition software. Similar sounding words can inadvertently be transcribed and may not be corrected upon review. Curt Bears, MD 10/06/2013

## 2013-10-06 NOTE — Progress Notes (Signed)
ANC 1.2; OK to treat per Awilda Metro, PA. To be dose-reduced.

## 2013-10-06 NOTE — Telephone Encounter (Signed)
gv adn printed aptp scehd adn avs for opt for May adn June....sed and MW added tx.

## 2013-10-26 ENCOUNTER — Telehealth: Payer: Self-pay | Admitting: Internal Medicine

## 2013-10-26 ENCOUNTER — Telehealth: Payer: Self-pay | Admitting: Medical Oncology

## 2013-10-26 NOTE — Telephone Encounter (Signed)
s.w. pt and advised on June appt and CX May appt...pt ok and aware

## 2013-10-26 NOTE — Telephone Encounter (Signed)
[place] at beach-has cough . Requests to cancel chemo this week and she will se [place] June 17th . Onc tx request sent to r/s

## 2013-10-27 ENCOUNTER — Ambulatory Visit: Payer: Medicare Other

## 2013-10-27 ENCOUNTER — Other Ambulatory Visit: Payer: Medicare Other

## 2013-10-27 ENCOUNTER — Ambulatory Visit: Payer: Medicare Other | Admitting: Physician Assistant

## 2013-11-17 ENCOUNTER — Encounter: Payer: Self-pay | Admitting: Internal Medicine

## 2013-11-17 ENCOUNTER — Ambulatory Visit (HOSPITAL_BASED_OUTPATIENT_CLINIC_OR_DEPARTMENT_OTHER): Payer: Medicare Other | Admitting: Internal Medicine

## 2013-11-17 ENCOUNTER — Ambulatory Visit: Payer: Medicare Other

## 2013-11-17 ENCOUNTER — Ambulatory Visit (HOSPITAL_BASED_OUTPATIENT_CLINIC_OR_DEPARTMENT_OTHER): Payer: Medicare Other

## 2013-11-17 ENCOUNTER — Telehealth: Payer: Self-pay | Admitting: Internal Medicine

## 2013-11-17 ENCOUNTER — Other Ambulatory Visit: Payer: Self-pay | Admitting: Medical Oncology

## 2013-11-17 ENCOUNTER — Encounter: Payer: Self-pay | Admitting: *Deleted

## 2013-11-17 ENCOUNTER — Other Ambulatory Visit (HOSPITAL_BASED_OUTPATIENT_CLINIC_OR_DEPARTMENT_OTHER): Payer: Medicare Other

## 2013-11-17 VITALS — BP 131/69 | HR 87 | Temp 99.0°F | Resp 18 | Ht 62.0 in | Wt 146.9 lb

## 2013-11-17 DIAGNOSIS — C3491 Malignant neoplasm of unspecified part of right bronchus or lung: Secondary | ICD-10-CM

## 2013-11-17 DIAGNOSIS — C3490 Malignant neoplasm of unspecified part of unspecified bronchus or lung: Secondary | ICD-10-CM

## 2013-11-17 DIAGNOSIS — C3410 Malignant neoplasm of upper lobe, unspecified bronchus or lung: Secondary | ICD-10-CM

## 2013-11-17 DIAGNOSIS — Z5111 Encounter for antineoplastic chemotherapy: Secondary | ICD-10-CM

## 2013-11-17 LAB — CBC WITH DIFFERENTIAL/PLATELET
BASO%: 0.3 % (ref 0.0–2.0)
BASOS ABS: 0 10*3/uL (ref 0.0–0.1)
EOS%: 2.2 % (ref 0.0–7.0)
Eosinophils Absolute: 0.1 10*3/uL (ref 0.0–0.5)
HCT: 39.1 % (ref 34.8–46.6)
HEMOGLOBIN: 12.8 g/dL (ref 11.6–15.9)
LYMPH%: 25.5 % (ref 14.0–49.7)
MCH: 32.1 pg (ref 25.1–34.0)
MCHC: 32.7 g/dL (ref 31.5–36.0)
MCV: 98 fL (ref 79.5–101.0)
MONO#: 0.3 10*3/uL (ref 0.1–0.9)
MONO%: 9.2 % (ref 0.0–14.0)
NEUT#: 2.3 10*3/uL (ref 1.5–6.5)
NEUT%: 62.8 % (ref 38.4–76.8)
PLATELETS: 201 10*3/uL (ref 145–400)
RBC: 3.99 10*6/uL (ref 3.70–5.45)
RDW: 13.5 % (ref 11.2–14.5)
WBC: 3.7 10*3/uL — AB (ref 3.9–10.3)
lymph#: 0.9 10*3/uL (ref 0.9–3.3)

## 2013-11-17 LAB — COMPREHENSIVE METABOLIC PANEL (CC13)
ALBUMIN: 3.3 g/dL — AB (ref 3.5–5.0)
ALK PHOS: 92 U/L (ref 40–150)
ALT: 9 U/L (ref 0–55)
ANION GAP: 8 meq/L (ref 3–11)
AST: 21 U/L (ref 5–34)
BUN: 8.9 mg/dL (ref 7.0–26.0)
CO2: 24 mEq/L (ref 22–29)
Calcium: 8.8 mg/dL (ref 8.4–10.4)
Chloride: 108 mEq/L (ref 98–109)
Creatinine: 0.8 mg/dL (ref 0.6–1.1)
GLUCOSE: 95 mg/dL (ref 70–140)
POTASSIUM: 4.1 meq/L (ref 3.5–5.1)
Sodium: 140 mEq/L (ref 136–145)
TOTAL PROTEIN: 6.7 g/dL (ref 6.4–8.3)
Total Bilirubin: 0.66 mg/dL (ref 0.20–1.20)

## 2013-11-17 MED ORDER — SODIUM CHLORIDE 0.9 % IJ SOLN
10.0000 mL | INTRAMUSCULAR | Status: DC | PRN
  Administered 2013-11-17: 10 mL
  Filled 2013-11-17: qty 10

## 2013-11-17 MED ORDER — ONDANSETRON 8 MG/50ML IVPB (CHCC)
8.0000 mg | Freq: Once | INTRAVENOUS | Status: AC
  Administered 2013-11-17: 8 mg via INTRAVENOUS

## 2013-11-17 MED ORDER — DEXAMETHASONE SODIUM PHOSPHATE 10 MG/ML IJ SOLN
INTRAMUSCULAR | Status: AC
  Filled 2013-11-17: qty 1

## 2013-11-17 MED ORDER — DEXAMETHASONE SODIUM PHOSPHATE 10 MG/ML IJ SOLN
10.0000 mg | Freq: Once | INTRAMUSCULAR | Status: AC
  Administered 2013-11-17: 10 mg via INTRAVENOUS

## 2013-11-17 MED ORDER — CYANOCOBALAMIN 1000 MCG/ML IJ SOLN
1000.0000 ug | Freq: Once | INTRAMUSCULAR | Status: AC
  Administered 2013-11-17: 1000 ug via INTRAMUSCULAR

## 2013-11-17 MED ORDER — CYANOCOBALAMIN 1000 MCG/ML IJ SOLN
INTRAMUSCULAR | Status: AC
  Filled 2013-11-17: qty 1

## 2013-11-17 MED ORDER — SODIUM CHLORIDE 0.9 % IV SOLN
Freq: Once | INTRAVENOUS | Status: AC
  Administered 2013-11-17: 10:00:00 via INTRAVENOUS

## 2013-11-17 MED ORDER — SODIUM CHLORIDE 0.9 % IV SOLN
400.0000 mg/m2 | Freq: Once | INTRAVENOUS | Status: AC
  Administered 2013-11-17: 700 mg via INTRAVENOUS
  Filled 2013-11-17: qty 28

## 2013-11-17 MED ORDER — HEPARIN SOD (PORK) LOCK FLUSH 100 UNIT/ML IV SOLN
500.0000 [IU] | Freq: Once | INTRAVENOUS | Status: AC | PRN
  Administered 2013-11-17: 500 [IU]
  Filled 2013-11-17: qty 5

## 2013-11-17 MED ORDER — ONDANSETRON 8 MG/NS 50 ML IVPB
INTRAVENOUS | Status: AC
Start: 2013-11-17 — End: 2013-11-17
  Filled 2013-11-17: qty 8

## 2013-11-17 NOTE — Progress Notes (Signed)
[place] [phone]   [phone]  VISIT PROGRESS NOTE  [name],[name], MD [place] [address]  Principle Diagnosis and stage: Recurrent non-small cell lung cancer, initially diagnosed a stage IB (T2, N0, M0) adenocarcinoma in June 2008 with a tumor size of 8 cm.   Prior Therapy:  #1 status post left upper lobectomy with lymph node dissection under the care of Dr. Arlyce Dice on 06/29/2006.  #2 status post 4 cycles of adjuvant chemotherapy with cisplatin and Taxotere last dose given 08/01/2006.  #3 status post 6 cycles of systemic chemotherapy with carboplatin and Alimta for disease recurrence last dose given 12/12/2009.   Current therapy: Maintenance chemotherapy with Alimta at 500 mg per meter squared given every 3 weeks status post 52 cycles.  CHEMOTHERAPY INTENT: Palliative/maintenance.  CURRENT # OF CHEMOTHERAPY CYCLES: 53  CURRENT ANTIEMETICS: Zofran, dexamethasone and Compazine  CURRENT SMOKING STATUS: Never Smoker.   ORAL CHEMOTHERAPY AND CONSENT: None  CURRENT BISPHOSPHONATES USE: None  PAIN MANAGEMENT: None  NARCOTICS INDUCED CONSTIPATION: None  LIVING WILL AND CODE STATUS: has living will for no CODE BLUE.  INTERVAL HISTORY: [name] 80 y.o. female returns to the clinic today for follow up visit. The patient is feeling fine today with no specific complaints except for her chest congestion and mild cough. She is tolerating her maintenance chemotherapy with single agent Alimta fairly well except for mild fatigue. The patient denied having any significant nausea or vomiting. She has no fever or chills. The she denied having any significant chest pain, shortness of breath, or hemoptysis. She has no weight loss or night sweats. She is here today to start cycle #53 of her chemotherapy.  MEDICAL HISTORY: Past Medical History  Diagnosis Date  . lung ca dx'd 06/2006    rt and lt lung  . History of  migraine headaches   . Hypercholesterolemia   . Lung cancer     ALLERGIES:  is allergic to simvastatin and iodinated diagnostic agents.  MEDICATIONS:  Current Outpatient Prescriptions  Medication Sig Dispense Refill  . Calcium Carbonate-Vit D-Min (CALTRATE PLUS PO) Take 500 mg by mouth 2 (two) times daily.        . chlorpheniramine-carbetapentane (TUSSI-12) 4-30 MG/5ML SUSP Take by mouth every 12 (twelve) hours as needed.      . folic acid (FOLVITE) 1 MG tablet Take 1 mg by mouth daily.        . Multiple Vitamins-Minerals (CENTRUM SILVER PO) Take 1 tablet by mouth daily.       . potassium gluconate 595 MG TABS Take 595 mg by mouth daily.      . ranitidine (ZANTAC) 75 MG tablet Take 75 mg by mouth. Pt takes as needed      . vitamin B-12 (CYANOCOBALAMIN) 500 MCG tablet Take 1,500 mcg by mouth daily.       . prochlorperazine (COMPAZINE) 10 MG tablet Take 10 mg by mouth every 6 (six) hours as needed.         No current facility-administered medications for this visit.    REVIEW OF SYSTEMS:  Constitutional: positive for fatigue Eyes: negative Ears, nose, mouth, throat, and face: negative Respiratory: negative Cardiovascular: negative Gastrointestinal: negative Genitourinary:negative Integument/breast: negative Hematologic/lymphatic: negative Musculoskeletal:negative Neurological: negative Behavioral/Psych: negative Endocrine: negative Allergic/Immunologic: negative   PHYSICAL EXAMINATION: General appearance: alert, cooperative and no distress Head: Normocephalic, without obvious abnormality, atraumatic Neck: no adenopathy Lymph nodes: Cervical, supraclavicular, and axillary nodes normal. Resp: clear  to auscultation bilaterally Back: symmetric, no curvature. ROM normal. No CVA tenderness. Cardio: regular rate and rhythm, S1, S2 normal, no murmur, click, rub or gallop GI: soft, non-tender; bowel sounds normal; no masses,  no organomegaly Extremities: extremities normal,  atraumatic, no cyanosis or edema Neurologic: Alert and oriented X 3, normal strength and tone. Normal symmetric reflexes. Normal coordination and gait  ECOG PERFORMANCE STATUS: 0 - Asymptomatic  Blood pressure 131/69, pulse 87, temperature 99 F (37.2 C), temperature source Oral, resp. rate 18, height 5\' 2"  (1.575 m), weight 146 lb 14.4 oz (66.633 kg).  LABORATORY DATA: Lab Results  Component Value Date   WBC 3.7* 11/17/2013   HGB 12.8 11/17/2013   HCT 39.1 11/17/2013   MCV 98.0 11/17/2013   PLT 201 11/17/2013      Chemistry      Component Value Date/Time   NA 140 11/17/2013 0849   NA 139 01/24/2012 1008   NA 140 08/21/2011 1306   K 4.1 11/17/2013 0849   K 4.2 01/24/2012 1008   K 4.1 08/21/2011 1306   CL 108* 11/04/2012 0947   CL 103 01/24/2012 1008   CL 107 08/21/2011 1306   CO2 24 11/17/2013 0849   CO2 28 01/24/2012 1008   CO2 22 08/21/2011 1306   BUN 8.9 11/17/2013 0849   BUN 13 01/24/2012 1008   BUN 11 08/21/2011 1306   CREATININE 0.8 11/17/2013 0849   CREATININE 0.8 01/24/2012 1008   CREATININE 0.81 08/21/2011 1306      Component Value Date/Time   CALCIUM 8.8 11/17/2013 0849   CALCIUM 8.6 01/24/2012 1008   CALCIUM 8.8 08/21/2011 1306   ALKPHOS 92 11/17/2013 0849   ALKPHOS 79 01/24/2012 1008   ALKPHOS 87 08/21/2011 1306   AST 21 11/17/2013 0849   AST 24 01/24/2012 1008   AST 17 08/21/2011 1306   ALT 9 11/17/2013 0849   ALT 21 01/24/2012 1008   ALT 8 08/21/2011 1306   BILITOT 0.66 11/17/2013 0849   BILITOT 0.90 01/24/2012 1008   BILITOT 0.6 08/21/2011 1306       RADIOGRAPHIC STUDIES:  ASSESSMENT AND PLAN: This is a very pleasant 80 years old white female with recurrent non-small cell lung cancer, adenocarcinoma. The patient is currently undergoing maintenance chemotherapy with single agent Alimta status post 52 cycles and she is tolerating it fairly well.  I recommended for the patient to proceed with her maintenance chemotherapy with single agent Alimta as scheduled. We'll proceed today  with cycle #53 as scheduled.  She would come back for followup visit in 3 weeks with the next cycle of her chemotherapy after repeating CT scan of the chest for restaging of her disease. She was advised to call immediately if she has any concerning symptoms in the interval.  Disclaimer: This note was dictated with voice recognition software. Similar sounding words can inadvertently be transcribed and may not be corrected upon review.    [name]., MD 11/17/2013

## 2013-11-17 NOTE — Patient Instructions (Signed)
[facility] Discharge Instructions for Patients Receiving Chemotherapy  Today you received the following chemotherapy agents Alimta.   To help prevent nausea and vomiting after your treatment, we encourage you to take your nausea medication.   If you develop nausea and vomiting that is not controlled by your nausea medication, call the clinic.   BELOW ARE SYMPTOMS THAT SHOULD BE REPORTED IMMEDIATELY:  *FEVER GREATER THAN 100.5 F  *CHILLS WITH OR WITHOUT FEVER  NAUSEA AND VOMITING THAT IS NOT CONTROLLED WITH YOUR NAUSEA MEDICATION  *UNUSUAL SHORTNESS OF BREATH  *UNUSUAL BRUISING OR BLEEDING  TENDERNESS IN MOUTH AND THROAT WITH OR WITHOUT PRESENCE OF ULCERS  *URINARY PROBLEMS  *BOWEL PROBLEMS  UNUSUAL RASH Items with * indicate a potential emergency and should be followed up as soon as possible.  Feel free to call the clinic you have any questions or concerns. The clinic phone number is [phone].

## 2013-11-17 NOTE — Progress Notes (Unsigned)
11/17/13 @ 2:40 pm, [name] -[id number], Withdrawal of Consent:  [name] called to say that she has changed her mind and wants to withdraw from the [facility].  She said she had some family issues and that she did not want to do the interview.  Thanked her for letting me know.

## 2013-11-17 NOTE — Telephone Encounter (Signed)
gv adn printd appt sched and avs for pt for July...sed added tx.

## 2013-12-07 ENCOUNTER — Ambulatory Visit (HOSPITAL_COMMUNITY)
Admission: RE | Admit: 2013-12-07 | Discharge: 2013-12-07 | Disposition: A | Discharge status: Home / Self Care | Payer: Medicare Other | Source: Ambulatory Visit | Attending: Internal Medicine | Admitting: Internal Medicine

## 2013-12-07 DIAGNOSIS — C3490 Malignant neoplasm of unspecified part of unspecified bronchus or lung: Secondary | ICD-10-CM | POA: Insufficient documentation

## 2013-12-07 DIAGNOSIS — Z902 Acquired absence of lung [part of]: Secondary | ICD-10-CM | POA: Insufficient documentation

## 2013-12-08 ENCOUNTER — Encounter: Payer: Self-pay | Admitting: Physician Assistant

## 2013-12-08 ENCOUNTER — Ambulatory Visit (HOSPITAL_BASED_OUTPATIENT_CLINIC_OR_DEPARTMENT_OTHER): Payer: Medicare Other | Admitting: Physician Assistant

## 2013-12-08 ENCOUNTER — Telehealth: Payer: Self-pay | Admitting: Internal Medicine

## 2013-12-08 ENCOUNTER — Ambulatory Visit (HOSPITAL_BASED_OUTPATIENT_CLINIC_OR_DEPARTMENT_OTHER): Payer: Medicare Other

## 2013-12-08 ENCOUNTER — Telehealth: Payer: Self-pay | Admitting: *Deleted

## 2013-12-08 ENCOUNTER — Other Ambulatory Visit (HOSPITAL_BASED_OUTPATIENT_CLINIC_OR_DEPARTMENT_OTHER): Payer: Medicare Other

## 2013-12-08 VITALS — BP 128/78 | HR 75 | Temp 98.1°F | Resp 18 | Ht 62.0 in | Wt 146.3 lb

## 2013-12-08 DIAGNOSIS — R5383 Other fatigue: Secondary | ICD-10-CM

## 2013-12-08 DIAGNOSIS — C3490 Malignant neoplasm of unspecified part of unspecified bronchus or lung: Secondary | ICD-10-CM

## 2013-12-08 DIAGNOSIS — R5381 Other malaise: Secondary | ICD-10-CM

## 2013-12-08 DIAGNOSIS — C3491 Malignant neoplasm of unspecified part of right bronchus or lung: Secondary | ICD-10-CM

## 2013-12-08 DIAGNOSIS — C3410 Malignant neoplasm of upper lobe, unspecified bronchus or lung: Secondary | ICD-10-CM

## 2013-12-08 DIAGNOSIS — C3411 Malignant neoplasm of upper lobe, right bronchus or lung: Secondary | ICD-10-CM

## 2013-12-08 DIAGNOSIS — C3430 Malignant neoplasm of lower lobe, unspecified bronchus or lung: Secondary | ICD-10-CM

## 2013-12-08 DIAGNOSIS — Z5111 Encounter for antineoplastic chemotherapy: Secondary | ICD-10-CM

## 2013-12-08 LAB — CBC WITH DIFFERENTIAL/PLATELET
BASO%: 0.6 % (ref 0.0–2.0)
BASOS ABS: 0 10*3/uL (ref 0.0–0.1)
EOS ABS: 0 10*3/uL (ref 0.0–0.5)
EOS%: 0.6 % (ref 0.0–7.0)
HEMATOCRIT: 38.8 % (ref 34.8–46.6)
HGB: 12.9 g/dL (ref 11.6–15.9)
LYMPH#: 1.7 10*3/uL (ref 0.9–3.3)
LYMPH%: 49.4 % (ref 14.0–49.7)
MCH: 32.9 pg (ref 25.1–34.0)
MCHC: 33.3 g/dL (ref 31.5–36.0)
MCV: 99 fL (ref 79.5–101.0)
MONO#: 0.4 10*3/uL (ref 0.1–0.9)
MONO%: 11.3 % (ref 0.0–14.0)
NEUT%: 38.1 % — AB (ref 38.4–76.8)
NEUTROS ABS: 1.3 10*3/uL — AB (ref 1.5–6.5)
PLATELETS: 301 10*3/uL (ref 145–400)
RBC: 3.92 10*6/uL (ref 3.70–5.45)
RDW: 14.3 % (ref 11.2–14.5)
WBC: 3.5 10*3/uL — ABNORMAL LOW (ref 3.9–10.3)

## 2013-12-08 LAB — COMPREHENSIVE METABOLIC PANEL (CC13)
ALT: 13 U/L (ref 0–55)
AST: 21 U/L (ref 5–34)
Albumin: 3.5 g/dL (ref 3.5–5.0)
Alkaline Phosphatase: 92 U/L (ref 40–150)
Anion Gap: 8 mEq/L (ref 3–11)
BUN: 10.8 mg/dL (ref 7.0–26.0)
CALCIUM: 8.8 mg/dL (ref 8.4–10.4)
CHLORIDE: 109 meq/L (ref 98–109)
CO2: 24 meq/L (ref 22–29)
Creatinine: 0.8 mg/dL (ref 0.6–1.1)
Glucose: 95 mg/dl (ref 70–140)
Potassium: 3.7 mEq/L (ref 3.5–5.1)
Sodium: 140 mEq/L (ref 136–145)
Total Bilirubin: 0.64 mg/dL (ref 0.20–1.20)
Total Protein: 6.8 g/dL (ref 6.4–8.3)

## 2013-12-08 MED ORDER — SODIUM CHLORIDE 0.9 % IV SOLN
400.0000 mg/m2 | Freq: Once | INTRAVENOUS | Status: AC
  Administered 2013-12-08: 700 mg via INTRAVENOUS
  Filled 2013-12-08: qty 28

## 2013-12-08 MED ORDER — ONDANSETRON 8 MG/NS 50 ML IVPB
INTRAVENOUS | Status: AC
  Filled 2013-12-08: qty 8

## 2013-12-08 MED ORDER — SODIUM CHLORIDE 0.9 % IV SOLN
Freq: Once | INTRAVENOUS | Status: AC
  Administered 2013-12-08: 14:00:00 via INTRAVENOUS

## 2013-12-08 MED ORDER — DEXAMETHASONE SODIUM PHOSPHATE 10 MG/ML IJ SOLN
INTRAMUSCULAR | Status: AC
  Filled 2013-12-08: qty 1

## 2013-12-08 MED ORDER — ONDANSETRON 8 MG/50ML IVPB (CHCC)
8.0000 mg | Freq: Once | INTRAVENOUS | Status: AC
  Administered 2013-12-08: 8 mg via INTRAVENOUS

## 2013-12-08 MED ORDER — SODIUM CHLORIDE 0.9 % IJ SOLN
10.0000 mL | INTRAMUSCULAR | Status: DC | PRN
  Administered 2013-12-08: 10 mL
  Filled 2013-12-08: qty 10

## 2013-12-08 MED ORDER — HEPARIN SOD (PORK) LOCK FLUSH 100 UNIT/ML IV SOLN
500.0000 [IU] | Freq: Once | INTRAVENOUS | Status: AC | PRN
  Administered 2013-12-08: 500 [IU]
  Filled 2013-12-08: qty 5

## 2013-12-08 MED ORDER — DEXAMETHASONE SODIUM PHOSPHATE 10 MG/ML IJ SOLN
10.0000 mg | Freq: Once | INTRAMUSCULAR | Status: AC
  Administered 2013-12-08: 10 mg via INTRAVENOUS

## 2013-12-08 NOTE — Telephone Encounter (Signed)
Per staff message and POF I have scheduled appts. Advised scheduler of appts. JMW  

## 2013-12-08 NOTE — Patient Instructions (Addendum)
[facility] Discharge Instructions for Patients Receiving Chemotherapy  Today you received the following chemotherapy agents :  Alimta.  To help prevent nausea and vomiting after your treatment, we encourage you to take your nausea medication as prescribed by your physician.   If you develop nausea and vomiting that is not controlled by your nausea medication, call the clinic.   BELOW ARE SYMPTOMS THAT SHOULD BE REPORTED IMMEDIATELY:  *FEVER GREATER THAN 100.5 F  *CHILLS WITH OR WITHOUT FEVER  NAUSEA AND VOMITING THAT IS NOT CONTROLLED WITH YOUR NAUSEA MEDICATION  *UNUSUAL SHORTNESS OF BREATH  *UNUSUAL BRUISING OR BLEEDING  TENDERNESS IN MOUTH AND THROAT WITH OR WITHOUT PRESENCE OF ULCERS  *URINARY PROBLEMS  *BOWEL PROBLEMS  UNUSUAL RASH Items with * indicate a potential emergency and should be followed up as soon as possible.  Feel free to call the clinic you have any questions or concerns. The clinic phone number is [phone].

## 2013-12-08 NOTE — Progress Notes (Signed)
Pt saw Adrena, Gas prior to chemo today.  OK to treat with ANC 1.3 today as per Burnetta Sabin,

## 2013-12-08 NOTE — Telephone Encounter (Signed)
Gave pt appt for lab,md and Radiation emailed michelle regarding chemo

## 2013-12-08 NOTE — Progress Notes (Addendum)
[facility] Telephone:([phone]   Fax:([phone]  OFFICE VISIT PROGRESS NOTE  [name],[name], MD [place] [address]  Principle Diagnosis and stage: Recurrent non-small cell lung cancer, initially diagnosed a stage IB (T2, N0, M0) adenocarcinoma in June 2008 with a tumor size of 8 cm.   Prior Therapy:  #1 status post left upper lobectomy with lymph node dissection under the care of Dr. Arlyce Dice on 06/29/2006.  #2 status post 4 cycles of adjuvant chemotherapy with cisplatin and Taxotere last dose given 08/01/2006.  #3 status post 6 cycles of systemic chemotherapy with carboplatin and Alimta for disease recurrence last dose given 12/12/2009.   Current therapy: Maintenance chemotherapy with Alimta at 500 mg per meter squared given every 3 weeks status post 53 cycles.  CHEMOTHERAPY INTENT: Palliative/maintenance.  CURRENT # OF CHEMOTHERAPY CYCLES: 54  CURRENT ANTIEMETICS: Zofran, dexamethasone and Compazine  CURRENT SMOKING STATUS: Never Smoker.   ORAL CHEMOTHERAPY AND CONSENT: None  CURRENT BISPHOSPHONATES USE: None  PAIN MANAGEMENT: None  NARCOTICS INDUCED CONSTIPATION: None  LIVING WILL AND CODE STATUS: has living will for no CODE BLUE.  INTERVAL HISTORY: KHIARA SHUPING 80 y.o. female returns to the clinic today for follow up visit. The patient is feeling fine today with no specific complaints. She is tolerating her maintenance chemotherapy with single agent Alimta fairly well except for mild fatigue. The patient denied having any significant nausea or vomiting. She has no fever or chills. The she denied having any significant chest pain, shortness of breath, or hemoptysis. She has no weight loss or night sweats. She is here today to start cycle #53 of her chemotherapy. She recently had a restaging CT scan of the chest without contrast to reevaluate her disease and also presents to discuss the results of that study.  MEDICAL  HISTORY: Past Medical History  Diagnosis Date  . lung ca dx'd 06/2006    rt and lt lung  . History of migraine headaches   . Hypercholesterolemia   . Lung cancer     ALLERGIES:  is allergic to simvastatin and iodinated diagnostic agents.  MEDICATIONS:  Current Outpatient Prescriptions  Medication Sig Dispense Refill  . Calcium Carbonate-Vit D-Min (CALTRATE PLUS PO) Take 500 mg by mouth 2 (two) times daily.        . folic acid (FOLVITE) 1 MG tablet Take 1 mg by mouth daily.        . Multiple Vitamins-Minerals (CENTRUM SILVER PO) Take 1 tablet by mouth daily.       . potassium gluconate 595 MG TABS Take 595 mg by mouth daily.      . vitamin B-12 (CYANOCOBALAMIN) 500 MCG tablet Take 1,500 mcg by mouth daily.       . chlorpheniramine-carbetapentane (TUSSI-12) 4-30 MG/5ML SUSP Take by mouth every 12 (twelve) hours as needed.      . prochlorperazine (COMPAZINE) 10 MG tablet Take 10 mg by mouth every 6 (six) hours as needed.        . ranitidine (ZANTAC) 75 MG tablet Take 75 mg by mouth. Pt takes as needed       No current facility-administered medications for this visit.   Facility-Administered Medications Ordered in Other Visits  Medication Dose Route Frequency Provider Last Rate Last Dose  . heparin lock flush 100 unit/mL  500 Units Intracatheter Once PRN Curt Bears, MD      . ondansetron (ZOFRAN) IVPB 8 mg  8 mg Intravenous Once [facility]  Mohamed, MD   8 mg at 12/08/13 1425  . PEMEtrexed (ALIMTA) 700 mg in sodium chloride 0.9 % 100 mL chemo infusion  400 mg/m2 (Treatment Plan Actual) Intravenous Once Curt Bears, MD      . sodium chloride 0.9 % injection 10 mL  10 mL Intracatheter PRN Curt Bears, MD        REVIEW OF SYSTEMS:  Constitutional: positive for fatigue Eyes: negative Ears, nose, mouth, throat, and face: negative Respiratory: negative Cardiovascular: negative Gastrointestinal: negative Genitourinary:negative Integument/breast: negative Hematologic/lymphatic:  negative Musculoskeletal:negative Neurological: negative Behavioral/Psych: negative Endocrine: negative Allergic/Immunologic: negative   PHYSICAL EXAMINATION: General appearance: alert, cooperative and no distress Head: Normocephalic, without obvious abnormality, atraumatic Neck: no adenopathy Lymph nodes: Cervical, supraclavicular, and axillary nodes normal. Resp: clear to auscultation bilaterally Back: symmetric, no curvature. ROM normal. No CVA tenderness. Cardio: regular rate and rhythm, S1, S2 normal, no murmur, click, rub or gallop GI: soft, non-tender; bowel sounds normal; no masses,  no organomegaly Extremities: extremities normal, atraumatic, no cyanosis or edema Neurologic: Alert and oriented X 3, normal strength and tone. Normal symmetric reflexes. Normal coordination and gait  ECOG PERFORMANCE STATUS: 0 - Asymptomatic  Blood pressure 128/78, pulse 75, temperature 98.1 F (36.7 C), temperature source Oral, resp. rate 18, height 5\' 2"  (1.575 m), weight 146 lb 4.8 oz (66.361 kg).  LABORATORY DATA: Lab Results  Component Value Date   WBC 3.5* 12/08/2013   HGB 12.9 12/08/2013   HCT 38.8 12/08/2013   MCV 99.0 12/08/2013   PLT 301 12/08/2013      Chemistry      Component Value Date/Time   NA 140 12/08/2013 1207   NA 139 01/24/2012 1008   NA 140 08/21/2011 1306   K 3.7 12/08/2013 1207   K 4.2 01/24/2012 1008   K 4.1 08/21/2011 1306   CL 108* 11/04/2012 0947   CL 103 01/24/2012 1008   CL 107 08/21/2011 1306   CO2 24 12/08/2013 1207   CO2 28 01/24/2012 1008   CO2 22 08/21/2011 1306   BUN 10.8 12/08/2013 1207   BUN 13 01/24/2012 1008   BUN 11 08/21/2011 1306   CREATININE 0.8 12/08/2013 1207   CREATININE 0.8 01/24/2012 1008   CREATININE 0.81 08/21/2011 1306      Component Value Date/Time   CALCIUM 8.8 12/08/2013 1207   CALCIUM 8.6 01/24/2012 1008   CALCIUM 8.8 08/21/2011 1306   ALKPHOS 92 12/08/2013 1207   ALKPHOS 79 01/24/2012 1008   ALKPHOS 87 08/21/2011 1306   AST 21 12/08/2013 1207   AST 24  01/24/2012 1008   AST 17 08/21/2011 1306   ALT 13 12/08/2013 1207   ALT 21 01/24/2012 1008   ALT 8 08/21/2011 1306   BILITOT 0.64 12/08/2013 1207   BILITOT 0.90 01/24/2012 1008   BILITOT 0.6 08/21/2011 1306       RADIOGRAPHIC STUDIES: Ct Chest Wo Contrast  12/07/2013   CLINICAL DATA:  Lung cancer diagnosed in 2008 with history of left lower lobectomy. Chemotherapy ongoing.  EXAM: CT CHEST WITHOUT CONTRAST  TECHNIQUE: Multidetector CT imaging of the chest was performed following the standard protocol without IV contrast.  COMPARISON:  Chest CTs dated 08/10/2013, 05/11/2013 and 02/15/2013.  FINDINGS: Within the limitations of noncontrast technique, the mediastinum has a stable appearance without evidence of adenopathy. Right IJ Port-A-Cath tip extends to the SVC right atrial junction. There is atherosclerosis of the aorta and coronary arteries. The heart size is normal. There is no pleural or pericardial effusion. A  small hiatal hernia is unchanged.  There has been interval enlargement of the ill-defined right upper lobe mass abutting the minor fissure. This measures up to 3.8 x 3.5 cm transverse on image 23 and is associated with air bronchograms inferiorly. There is adjacent airspace disease extending anteriorly along the minor fissure.  11 mm right middle lobe nodular density on image 42 appears stable (most discrete on sagittal image 41). The sub solid subpleural lesion in the left upper lobe appears slightly larger and more dense, measuring 13 mm on image 35. 4 mm subpleural left upper lobe nodule on image 23 is unchanged. There is no endobronchial lesion. The left lower lobe has been previously resected.  The visualized upper abdomen has a stable appearance. There is no adrenal mass.  Thoracic spine Schmorl's nodes are stable. There are no worrisome osseous findings.  IMPRESSION: 1. Progressive enlargement of dominant right upper lobe ill-defined mass consistent with tumor progression. 2. Nodular densities  within the right middle lobe and left lung are similar to prior studies, although the subpleural left upper lobe lesion appears slightly denser. 3. No evidence of adenopathy or pleural effusion. 4. Consider PET-CT for further evaluation of recurrent metabolic activity within the dominant right upper lobe lesion.   Electronically Signed   By: Camie Patience M.D.   On: 12/07/2013 16:22    ASSESSMENT AND PLAN: This is a very pleasant 80 years old white female with recurrent non-small cell lung cancer, adenocarcinoma. The patient is currently undergoing maintenance chemotherapy with single agent Alimta status post 53 cycles and she is tolerating it fairly well. The patient was discussed with and also seen by Dr. Julien Nordmann. Her recent restaging CT scan of the chest revealed increase in the size of the right lung lesion consistent with progressive disease. The CT scan results were reviewed with Ms. Winegarden. Dr. Julien Nordmann to discussed the possible treatment options which included adding back carboplatin to the Alimta versus referral to radiation oncology for radiation to this lesion. We have opted to refer the patient to radiation therapy for SRS to the right lung lesion as this is the only area of increase. She'll remain on maintenance Alimta at 500 mg per meter squared given every 3 weeks and she will proceed with cycle #54 today.  She'll followup in 3 weeks prior to next scheduled cycle of maintenance Alimta.  She was advised to call immediately if she has any concerning symptoms in the interval.  Disclaimer: This note was dictated with voice recognition software. Similar sounding words can inadvertently be transcribed and may not be corrected upon review.    Carlton Adam, [name] 12/08/2013  ADDENDUM: Hematology/Oncology Attending: I had a face to face encounter with the patient. I recommended her care plan. This is a very pleasant 80 years old white female with recurrent non-small cell lung cancer,  adenocarcinoma. She is currently undergoing maintenance chemotherapy with single agent Alimta status post 53 cycles. The patient is tolerating her treatment fairly well with no significant adverse effects. Her recent CT scan of the chest showed evidence for disease progression with enlargement of the dominant right upper lobe lung mass consistent with disease progression. I discussed the scan results with the patient today. I discussed with her several options for treatment of her condition including adding carboplatin to her current treatment with Alimta versus continuing maintenance Alimta and referring her to radiation oncology for consideration of stereotactic radiotherapy to the enlarging solitary lung lesion. The patient preferred to consider the radiotherapy and  continue with the maintenance treatment. I will arrange for her to see Dr. Pablo Ledger next week. She will proceed with cycle #54 today as scheduled. The patient would come back for followup visit in 3 weeks with the start of the next cycle of her treatment. She was advised to call immediately if she has any concerning symptoms in the interval.  Disclaimer: This note was dictated with voice recognition software. Similar sounding words can inadvertently be transcribed and may not be corrected upon review. [name]., MD 12/11/2013

## 2013-12-09 ENCOUNTER — Telehealth: Payer: Self-pay | Admitting: Internal Medicine

## 2013-12-09 NOTE — Telephone Encounter (Signed)
Tallked to pt and gave her appt for July an and August 2015

## 2013-12-10 NOTE — Patient Instructions (Signed)
Your CT scan revealed increase in the right lung lesion. You're being referred to radiation oncology for radiation to this area. You will continue on maintenance Alimta as previously ordered. Followup in 3 weeks

## 2013-12-15 ENCOUNTER — Encounter: Payer: Self-pay | Admitting: Radiation Oncology

## 2013-12-15 NOTE — Progress Notes (Signed)
Thoracic Location of Tumor / Histology: Recurrent non-small cell  Lung Cancer initially  DX  June 2008  Patient presented  Chest congestion mild cough   Biopsies of   Revealed:1. LUNG, NEEDLE/CORE BIOPSY(IES), 08/15/2009: RIGHT : - ADENOCARCINOMA WITH BRONCHIOALVEOLAR FEATURES.SPECIMEN(S) OBTAINED 1. Lung, needle/core biopsy(ies), RightSPECIMEN COMMENTS:1. Rul nodule; history of lung cancer (tc) Gross Description1. Received are three delicate cores of pink-tan, focally hemorrhagic tissueranging from 0.7 to 2.2 cm in length and from less than 0.1 to 0.1 cm in diameter. The specimen is entirely submitted in a single block. (Jc/Gp:M 08/14/09) MICROSCOPIC DESCRIPTION 1. The biopsies are composed of well differentiated adenocarcinoma withbronchioalveolar features. No stromal or angiolymphatic invasion is identified.Clinical and radiographic correlation is recommended. The paraffin block will be sent for egfr mutation test and an addendum will follow. INTERPRETATION(S):08/14/09: LUNG, RIGHT, FINE NEEDLE ASPIRATION:- adenocarcinoma, well differentiated MICROSCOPIC EXAMINATION AND DIAGNOSIS 06/19/2006:  1. LUNG, LEFT LOWER LOBE, LOBECTOMY:- INVASIVE WELL TO MODERATELY DIFFERENTIATED ADENOCARCINOMA.- 8.0 CM IN GREATEST DIMENSION- VISCERAL PLEURA FREE OF TUMOR- BRONCHIAL MARGIN FREE OF TUMOR.- ONE HILAR LYMPH NODE NEGATIVE FOR TUMOR.2. LYMPH NODE, 9L, BIOPSY: NO METASTATIC CARCINOMA IDENTIFIED.#2, BIOPSY: NO METASTATIC CARCINOMA  IDENTIFIED. 3. LYMPH NODE, 11L, BIOPSY: NO METASTATIC CARCINOMA IDENTIFIED.4. LYMPH NODE, 11L, .5. LYMPH NODE, 10L, BIOPSY: NO METASTATIC CARCINOMA IDENTIFIED.6. LYMPH NODE, 10L, #2, BIOPSY: NO METASTATIC CARCINOMA IDENTIFIED.7. LYMPH NODE, 11L, #3, BIOPSY: NO METASTATIC CARCINOMA I DENTIFIED.8. LYMPH NODE, 11L, #4, BIOPSY: NO METASTATIC CARCINOMA IDENTIFIED.9. LYMPH NODE, 11L, #5, BIOPSY: NO METASTATIC CARCINOMAIDENTIFIED.    Tobacco/Marijuana/Snuff/ETOH use: Non smoker,    Past/Anticipated interventions by cardiothoracic surgery, if any: yes, Left lower  lung lobectomy 06/19/2006, Dr.Burney,Donald,M.D.  Past/Anticipated interventions by medical oncology, if any: yes, Prior Therapy:  #1 status post left lower  lobectomy with lymph node dissection under the care of Dr. Arlyce Dice on 06/29/2006.  #2 status post 4 cycles of adjuvant chemotherapy with cisplatin and Taxotere last dose given 08/01/2006.  #3 status post 6 cycles of systemic chemotherapy with carboplatin and Alimta for disease recurrence last dose given 12/12/2009.  Current therapy: Maintenance chemotherapy with Alimta at 500 mg per meter squared given every 3 weeks status post 53 cycles.  CHEMOTHERAPY INTENT: Palliative/maintenance.  CURRENT # OF CHEMOTHERAPY CYCLES: 54,dated 12/11/2013   Signs/Symptoms  Weight changes, if any: no  Respiratory complaints, if VVY:[id number] of breath on exertion only,non-productive cough  Hemoptysis, if any: no Pain issues, if any: No SAFETY ISSUES:  Prior radiation? No  Pacemaker/ICD? no   Possible current pregnancy?no  Is the patient on methotrexate? no  Current Complaints / other details: patient here with one of 2 daughters.Widowed.Had lots of questions regarding radiation , thought she was going to be treated today.Explained clinic process and that Dr.Wentworth will answer questions today during the consultation.Patient is eager to get started with radiation "if I need it." I also explained insurance process and that we do not take co-pays.Gave billing sheet information.

## 2013-12-16 ENCOUNTER — Ambulatory Visit
Admission: RE | Admit: 2013-12-16 | Discharge: 2013-12-16 | Disposition: A | Discharge status: Home / Self Care | Payer: Medicare Other | Source: Ambulatory Visit | Attending: Radiation Oncology | Admitting: Radiation Oncology

## 2013-12-16 ENCOUNTER — Encounter: Payer: Self-pay | Admitting: Radiation Oncology

## 2013-12-16 VITALS — BP 137/73 | HR 65 | Temp 98.0°F | Wt 147.4 lb

## 2013-12-16 DIAGNOSIS — C3410 Malignant neoplasm of upper lobe, unspecified bronchus or lung: Secondary | ICD-10-CM

## 2013-12-16 DIAGNOSIS — Z902 Acquired absence of lung [part of]: Secondary | ICD-10-CM | POA: Diagnosis not present

## 2013-12-16 DIAGNOSIS — Z9221 Personal history of antineoplastic chemotherapy: Secondary | ICD-10-CM | POA: Diagnosis not present

## 2013-12-16 DIAGNOSIS — C3491 Malignant neoplasm of unspecified part of right bronchus or lung: Secondary | ICD-10-CM

## 2013-12-16 DIAGNOSIS — Z85118 Personal history of other malignant neoplasm of bronchus and lung: Secondary | ICD-10-CM | POA: Insufficient documentation

## 2013-12-16 DIAGNOSIS — G43909 Migraine, unspecified, not intractable, without status migrainosus: Secondary | ICD-10-CM | POA: Insufficient documentation

## 2013-12-16 DIAGNOSIS — Z510 Encounter for antineoplastic radiation therapy: Secondary | ICD-10-CM | POA: Diagnosis not present

## 2013-12-16 HISTORY — DX: Family history of other specified conditions: Z84.89

## 2013-12-16 HISTORY — DX: Family history of other disabilities and chronic diseases leading to disablement, not elsewhere classified: Z82.8

## 2013-12-16 NOTE — Addendum Note (Signed)
Encounter addended by: Thea Silversmith, MD on: 12/16/2013  6:45 PM<BR>     Documentation filed: Clinical Notes, Follow-up Section, LOS Section

## 2013-12-16 NOTE — Progress Notes (Signed)
Please see the Nurse Progress Note in the MD Initial Consult Encounter for this patient. 

## 2013-12-16 NOTE — Progress Notes (Signed)
Radiation Oncology         [phone]) [phone] ________________________________  Initial outpatient Consultation - Date: 12/16/2013   Name: [name] MRN: [id number]   DOB: February 14, 1933  REFERRING PHYSICIAN: Curt Bears, MD  DIAGNOSIS:    ICD-9-CM   1. Malignant neoplasm of right lung, unspecified part of lung 162.9 NM PET Image Restage (PS) Whole Body    NM PET Image Restag (PS) Skull Base To Thigh    STAGE: Lung cancer   Primary site: Lung   Staging method: AJCC 7th Edition   Clinical free text: Recurrent non-small cell lung cancer initially diagnosed as stage Ib   Clinical: Stage IB (T2a, N0, M0) signed by Curt Bears, MD on 11/17/2013  9:50 AM   Summary: Stage IB (T2a, N0, M0)  HISTORY OF PRESENT ILLNESS::[name] is a 80 y.o. female  with a history of a left upper lobectomy and adjuvant chemotherapy in 2008. She was noted to have disease recurrence in 2011 and had 6 cycles of carboplatin and Alimta. Pathology of biopsy of this mass on 08/14/2009 showed adenocarcinoma with bronchoalveolar features. She has been on maintenance chemotherapy with Alimta since that time. She was seen in followup recently medical oncology and was noted to have an increasing right upper lobe mass. This measured 3.8 x 3.5 cm other lesions were stable. No evidence of mediastinal adenopathy or metastatic disease was noted. Her last PET scan was in 2008. She feels great and performs all of her activities of daily living. Ski she is scheduled for chemotherapy on the 29th. She is accompanied by her daughter today. She denies any headaches or hemoptysis. Denies any weight loss. She has a stable nonproductive cough.  PREVIOUS RADIATION THERAPY: No  PAST MEDICAL HISTORY:  has a past medical history of History of migraine headaches; Hypercholesterolemia; lung ca (dx'd 06/2006); Lung cancer (3/14/1); and FHx: chemotherapy (2008&2011).    PAST SURGICAL HISTORY: Past Surgical History  Procedure Laterality  Date  . Left  lowerlung lobectomy Left 06/19/2006    Dr.Burney,Left lower lobectomy  . Porta-cath  2011  . Cataract extraction  2013    bilateral    FAMILY HISTORY: No family history on file.  SOCIAL HISTORY:  History  Substance Use Topics  . Smoking status: Never Smoker   . Smokeless tobacco: Not on file  . Alcohol Use: Not on file    ALLERGIES: Simvastatin and Iodinated diagnostic agents  MEDICATIONS:  Current Outpatient Prescriptions  Medication Sig Dispense Refill  . Calcium Carbonate-Vit D-Min (CALTRATE PLUS PO) Take 500 mg by mouth 2 (two) times daily.        . folic acid (FOLVITE) 1 MG tablet Take 1 mg by mouth daily.        . Multiple Vitamins-Minerals (CENTRUM SILVER PO) Take 1 tablet by mouth daily.       . potassium gluconate 595 MG TABS Take 595 mg by mouth daily.      . prochlorperazine (COMPAZINE) 10 MG tablet Take 10 mg by mouth every 6 (six) hours as needed.        . ranitidine (ZANTAC) 75 MG tablet Take 75 mg by mouth. Pt takes as needed      . vitamin B-12 (CYANOCOBALAMIN) 500 MCG tablet Take 1,500 mcg by mouth daily.       . chlorpheniramine-carbetapentane (TUSSI-12) 4-30 MG/5ML SUSP Take by mouth every 12 (twelve) hours as needed.       No current facility-administered medications for this encounter.  REVIEW OF SYSTEMS:  A 15 point review of systems is documented in the electronic medical record. This was obtained by the nursing staff. However, I reviewed this with the patient to discuss relevant findings and make appropriate changes.  Pertinent items are noted in HPI.  PHYSICAL EXAM:  Filed Vitals:   12/16/13 0859  BP: 137/73  Pulse: 65  Temp: 98 F (36.7 C)  .147 lb 6.4 oz (66.86 kg). As him female in no distress sitting comfortably on examining table. Alert minus x3. 5 out of 5 strength bilaterally.  LABORATORY DATA:  Lab Results  Component Value Date   WBC 3.5* 12/08/2013   HGB 12.9 12/08/2013   HCT 38.8 12/08/2013   MCV 99.0 12/08/2013   PLT 301  12/08/2013   Lab Results  Component Value Date   NA 140 12/08/2013   K 3.7 12/08/2013   CL 108* 11/04/2012   CO2 24 12/08/2013   Lab Results  Component Value Date   ALT 13 12/08/2013   AST 21 12/08/2013   ALKPHOS 92 12/08/2013   BILITOT 0.64 12/08/2013     RADIOGRAPHY: Ct Chest Wo Contrast  12/07/2013   CLINICAL DATA:  Lung cancer diagnosed in 2008 with history of left lower lobectomy. Chemotherapy ongoing.  EXAM: CT CHEST WITHOUT CONTRAST  TECHNIQUE: Multidetector CT imaging of the chest was performed following the standard protocol without IV contrast.  COMPARISON:  Chest CTs dated 08/10/2013, 05/11/2013 and 02/15/2013.  FINDINGS: Within the limitations of noncontrast technique, the mediastinum has a stable appearance without evidence of adenopathy. Right IJ Port-A-Cath tip extends to the SVC right atrial junction. There is atherosclerosis of the aorta and coronary arteries. The heart size is normal. There is no pleural or pericardial effusion. A small hiatal hernia is unchanged.  There has been interval enlargement of the ill-defined right upper lobe mass abutting the minor fissure. This measures up to 3.8 x 3.5 cm transverse on image 23 and is associated with air bronchograms inferiorly. There is adjacent airspace disease extending anteriorly along the minor fissure.  11 mm right middle lobe nodular density on image 42 appears stable (most discrete on sagittal image 41). The sub solid subpleural lesion in the left upper lobe appears slightly larger and more dense, measuring 13 mm on image 35. 4 mm subpleural left upper lobe nodule on image 23 is unchanged. There is no endobronchial lesion. The left lower lobe has been previously resected.  The visualized upper abdomen has a stable appearance. There is no adrenal mass.  Thoracic spine Schmorl's nodes are stable. There are no worrisome osseous findings.  IMPRESSION: 1. Progressive enlargement of dominant right upper lobe ill-defined mass consistent with tumor  progression. 2. Nodular densities within the right middle lobe and left lung are similar to prior studies, although the subpleural left upper lobe lesion appears slightly denser. 3. No evidence of adenopathy or pleural effusion. 4. Consider PET-CT for further evaluation of recurrent metabolic activity within the dominant right upper lobe lesion.   Electronically Signed   By: Camie Patience M.D.   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 This would give me a confidence that she doesn't have systemic disease that is also progressing which would require a change in chemotherapy regimens rather than local treatment. If this indeed is the only area that  progressing I think local treatment is probably reasonable. The lesion is too large for stereotactic treatment that we could do a hypofractionated treatment to this region. I think in someone the neighborhood of 50 gray in 10 fractions. I think we can do this without compromising her in any way other than some fatigue and some shoulder discomfort. We discussed the low likelihood of symptomatic lung or rib damage. Since kind of close to the ribs on the right so she may ultimately develop a rib fracture but that would be many years from now. We discussed the process of simulation the placement tattoos. We discussed the use of respiratory compression. We discussed the use of 4D CT for tumor identification. At this point we've scheduled her PET scan for next week she will get her chemotherapy on the 29th and we will schedule her SBRT simulation for some time the following week.  I spent 60 minutes  face to face with the patient and more than 50% of that time was spent in counseling and/or coordination of care.   ------------------------------------------------  Thea Silversmith, MD

## 2013-12-24 ENCOUNTER — Encounter (HOSPITAL_COMMUNITY): Payer: Self-pay

## 2013-12-24 ENCOUNTER — Ambulatory Visit (HOSPITAL_COMMUNITY)
Admission: RE | Admit: 2013-12-24 | Discharge: 2013-12-24 | Disposition: A | Discharge status: Home / Self Care | Payer: Medicare Other | Source: Ambulatory Visit | Attending: Radiation Oncology | Admitting: Radiation Oncology

## 2013-12-24 DIAGNOSIS — C3491 Malignant neoplasm of unspecified part of right bronchus or lung: Secondary | ICD-10-CM

## 2013-12-24 DIAGNOSIS — N83209 Unspecified ovarian cyst, unspecified side: Secondary | ICD-10-CM | POA: Insufficient documentation

## 2013-12-24 DIAGNOSIS — C3490 Malignant neoplasm of unspecified part of unspecified bronchus or lung: Secondary | ICD-10-CM | POA: Insufficient documentation

## 2013-12-24 DIAGNOSIS — R918 Other nonspecific abnormal finding of lung field: Secondary | ICD-10-CM | POA: Insufficient documentation

## 2013-12-24 LAB — GLUCOSE, CAPILLARY: GLUCOSE-CAPILLARY: 85 mg/dL (ref 70–99)

## 2013-12-24 MED ORDER — FLUDEOXYGLUCOSE F - 18 (FDG) INJECTION: 7.6000 | Freq: Once | INTRAVENOUS | Status: AC | PRN

## 2013-12-29 ENCOUNTER — Ambulatory Visit (HOSPITAL_BASED_OUTPATIENT_CLINIC_OR_DEPARTMENT_OTHER): Payer: Medicare Other | Admitting: Internal Medicine

## 2013-12-29 ENCOUNTER — Ambulatory Visit (HOSPITAL_BASED_OUTPATIENT_CLINIC_OR_DEPARTMENT_OTHER): Payer: Medicare Other

## 2013-12-29 ENCOUNTER — Telehealth: Payer: Self-pay | Admitting: Internal Medicine

## 2013-12-29 ENCOUNTER — Other Ambulatory Visit (HOSPITAL_BASED_OUTPATIENT_CLINIC_OR_DEPARTMENT_OTHER): Payer: Medicare Other

## 2013-12-29 ENCOUNTER — Encounter: Payer: Self-pay | Admitting: Internal Medicine

## 2013-12-29 VITALS — BP 151/65 | HR 64 | Temp 97.9°F | Resp 18 | Ht 62.0 in | Wt 146.4 lb

## 2013-12-29 DIAGNOSIS — C3491 Malignant neoplasm of unspecified part of right bronchus or lung: Secondary | ICD-10-CM

## 2013-12-29 DIAGNOSIS — Z5111 Encounter for antineoplastic chemotherapy: Secondary | ICD-10-CM

## 2013-12-29 DIAGNOSIS — C3410 Malignant neoplasm of upper lobe, unspecified bronchus or lung: Secondary | ICD-10-CM

## 2013-12-29 DIAGNOSIS — C3411 Malignant neoplasm of upper lobe, right bronchus or lung: Secondary | ICD-10-CM

## 2013-12-29 LAB — COMPREHENSIVE METABOLIC PANEL (CC13)
ALT: 10 U/L (ref 0–55)
ANION GAP: 8 meq/L (ref 3–11)
AST: 21 U/L (ref 5–34)
Albumin: 3.5 g/dL (ref 3.5–5.0)
Alkaline Phosphatase: 91 U/L (ref 40–150)
BUN: 14.3 mg/dL (ref 7.0–26.0)
CALCIUM: 9 mg/dL (ref 8.4–10.4)
CHLORIDE: 109 meq/L (ref 98–109)
CO2: 23 meq/L (ref 22–29)
Creatinine: 0.9 mg/dL (ref 0.6–1.1)
Glucose: 83 mg/dl (ref 70–140)
Potassium: 4.1 mEq/L (ref 3.5–5.1)
Sodium: 141 mEq/L (ref 136–145)
Total Bilirubin: 0.58 mg/dL (ref 0.20–1.20)
Total Protein: 7.1 g/dL (ref 6.4–8.3)

## 2013-12-29 LAB — CBC WITH DIFFERENTIAL/PLATELET
BASO%: 0.7 % (ref 0.0–2.0)
BASOS ABS: 0 10*3/uL (ref 0.0–0.1)
EOS%: 0.8 % (ref 0.0–7.0)
Eosinophils Absolute: 0 10*3/uL (ref 0.0–0.5)
HEMATOCRIT: 39.9 % (ref 34.8–46.6)
HEMOGLOBIN: 13.1 g/dL (ref 11.6–15.9)
LYMPH%: 40.6 % (ref 14.0–49.7)
MCH: 32.7 pg (ref 25.1–34.0)
MCHC: 32.8 g/dL (ref 31.5–36.0)
MCV: 99.8 fL (ref 79.5–101.0)
MONO#: 0.4 10*3/uL (ref 0.1–0.9)
MONO%: 11.3 % (ref 0.0–14.0)
NEUT#: 1.7 10*3/uL (ref 1.5–6.5)
NEUT%: 46.6 % (ref 38.4–76.8)
PLATELETS: 269 10*3/uL (ref 145–400)
RBC: 3.99 10*6/uL (ref 3.70–5.45)
RDW: 15.7 % — ABNORMAL HIGH (ref 11.2–14.5)
WBC: 3.8 10*3/uL — AB (ref 3.9–10.3)
lymph#: 1.5 10*3/uL (ref 0.9–3.3)

## 2013-12-29 MED ORDER — DEXAMETHASONE SODIUM PHOSPHATE 10 MG/ML IJ SOLN
10.0000 mg | Freq: Once | INTRAMUSCULAR | Status: AC
Start: 2013-12-29 — End: 2013-12-29
  Administered 2013-12-29: 10 mg via INTRAVENOUS

## 2013-12-29 MED ORDER — ONDANSETRON 8 MG/50ML IVPB (CHCC)
8.0000 mg | Freq: Once | INTRAVENOUS | Status: AC
  Administered 2013-12-29: 8 mg via INTRAVENOUS

## 2013-12-29 MED ORDER — ONDANSETRON 8 MG/NS 50 ML IVPB
INTRAVENOUS | Status: AC
  Filled 2013-12-29: qty 8

## 2013-12-29 MED ORDER — SODIUM CHLORIDE 0.9 % IV SOLN
400.0000 mg/m2 | Freq: Once | INTRAVENOUS | Status: AC
  Administered 2013-12-29: 700 mg via INTRAVENOUS
  Filled 2013-12-29: qty 28

## 2013-12-29 MED ORDER — SODIUM CHLORIDE 0.9 % IJ SOLN
10.0000 mL | INTRAMUSCULAR | Status: DC | PRN
  Administered 2013-12-29: 10 mL
  Filled 2013-12-29: qty 10

## 2013-12-29 MED ORDER — HEPARIN SOD (PORK) LOCK FLUSH 100 UNIT/ML IV SOLN
500.0000 [IU] | Freq: Once | INTRAVENOUS | Status: AC | PRN
Start: 2013-12-29 — End: 2013-12-29
  Administered 2013-12-29: 500 [IU]
  Filled 2013-12-29: qty 5

## 2013-12-29 MED ORDER — SODIUM CHLORIDE 0.9 % IV SOLN
Freq: Once | INTRAVENOUS | Status: AC
  Administered 2013-12-29: 11:00:00 via INTRAVENOUS

## 2013-12-29 MED ORDER — DEXAMETHASONE SODIUM PHOSPHATE 10 MG/ML IJ SOLN
INTRAMUSCULAR | Status: AC
Start: 2013-12-29 — End: 2013-12-29
  Filled 2013-12-29: qty 1

## 2013-12-29 NOTE — Patient Instructions (Signed)
[facility] Discharge Instructions for Patients Receiving Chemotherapy  Today you received the following chemotherapy agents: Alimta   To help prevent nausea and vomiting after your treatment, we encourage you to take your nausea medication as prescribed by your physician.    If you develop nausea and vomiting that is not controlled by your nausea medication, call the clinic.   BELOW ARE SYMPTOMS THAT SHOULD BE REPORTED IMMEDIATELY:  *FEVER GREATER THAN 100.5 F  *CHILLS WITH OR WITHOUT FEVER  NAUSEA AND VOMITING THAT IS NOT CONTROLLED WITH YOUR NAUSEA MEDICATION  *UNUSUAL SHORTNESS OF BREATH  *UNUSUAL BRUISING OR BLEEDING  TENDERNESS IN MOUTH AND THROAT WITH OR WITHOUT PRESENCE OF ULCERS  *URINARY PROBLEMS  *BOWEL PROBLEMS  UNUSUAL RASH Items with * indicate a potential emergency and should be followed up as soon as possible.  Feel free to call the clinic you have any questions or concerns. The clinic phone number is [phone].

## 2013-12-29 NOTE — Progress Notes (Signed)
[facility] [phone]   Fax:([phone]  VISIT PROGRESS NOTE  [name],[name], MD [place] [address]  Principle Diagnosis and stage: Recurrent non-small cell lung cancer, initially diagnosed a stage IB (T2, N0, M0) adenocarcinoma in June 2008 with a tumor size of 8 cm.   Prior Therapy:  #1 status post left upper lobectomy with lymph node dissection under the care of Dr. Arlyce Dice on 06/29/2006.  #2 status post 4 cycles of adjuvant chemotherapy with cisplatin and Taxotere last dose given 08/01/2006.  #3 status post 6 cycles of systemic chemotherapy with carboplatin and Alimta for disease recurrence last dose given 12/12/2009.   Current therapy: Maintenance chemotherapy with Alimta at 500 mg per meter squared given every 3 weeks status post 54 cycles.  CHEMOTHERAPY INTENT: Palliative/maintenance.  CURRENT # OF CHEMOTHERAPY CYCLES: 55  CURRENT ANTIEMETICS: Zofran, dexamethasone and Compazine  CURRENT SMOKING STATUS: Never Smoker.   ORAL CHEMOTHERAPY AND CONSENT: None  CURRENT BISPHOSPHONATES USE: None  PAIN MANAGEMENT: None  NARCOTICS INDUCED CONSTIPATION: None  LIVING WILL AND CODE STATUS: has living will for no CODE BLUE.  INTERVAL HISTORY: [name] 81 y.o. female returns to the clinic today for follow up visit. She was seen recently by Dr. Pablo Ledger for evaluation and consideration of palliative radiotherapy to the enlarging right lung mass. She is expected to start her radiation therapy next week for a total of 10 fractions. The patient is feeling fine today with no specific complaints. She is tolerating her maintenance chemotherapy with single agent Alimta fairly well. The patient denied having any significant nausea or vomiting. She has no fever or chills. The she denied having any significant chest pain, shortness of breath, or hemoptysis. She has no weight loss or night sweats. She had a PET scan performed  recently for evaluation of her disease. She is here today to start cycle #55 of her chemotherapy.  MEDICAL HISTORY: Past Medical History  Diagnosis Date  . History of migraine headaches   . Hypercholesterolemia   . lung ca dx'd 06/2006    rt and lt lung  . Lung cancer 3/14/1    right- Adenocarcinoma w/bronchioalveolar features  . FHx: chemotherapy 2008&2011    4 cycles cisplatin,taxotere,2008& carboplatin and Alimta 2011    ALLERGIES:  is allergic to simvastatin and iodinated diagnostic agents.  MEDICATIONS:  Current Outpatient Prescriptions  Medication Sig Dispense Refill  . Calcium Carbonate-Vit D-Min (CALTRATE PLUS PO) Take 500 mg by mouth 2 (two) times daily.        . folic acid (FOLVITE) 1 MG tablet Take 1 mg by mouth daily.        . Multiple Vitamins-Minerals (CENTRUM SILVER PO) Take 1 tablet by mouth daily.       . potassium gluconate 595 MG TABS Take 595 mg by mouth daily.      . prochlorperazine (COMPAZINE) 10 MG tablet Take 10 mg by mouth every 6 (six) hours as needed.        . ranitidine (ZANTAC) 75 MG tablet Take 75 mg by mouth. Pt takes as needed      . vitamin B-12 (CYANOCOBALAMIN) 500 MCG tablet Take 1,500 mcg by mouth daily.       . chlorpheniramine-carbetapentane (TUSSI-12) 4-30 MG/5ML SUSP Take by mouth every 12 (twelve) hours as needed.       No current facility-administered medications for this visit.    REVIEW OF SYSTEMS:  Constitutional: positive for fatigue Eyes:  negative Ears, nose, mouth, throat, and face: negative Respiratory: negative Cardiovascular: negative Gastrointestinal: negative Genitourinary:negative Integument/breast: negative Hematologic/lymphatic: negative Musculoskeletal:negative Neurological: negative Behavioral/Psych: negative Endocrine: negative Allergic/Immunologic: negative   PHYSICAL EXAMINATION: General appearance: alert, cooperative and no distress Head: Normocephalic, without obvious abnormality, atraumatic Neck: no  adenopathy Lymph nodes: Cervical, supraclavicular, and axillary nodes normal. Resp: clear to auscultation bilaterally Back: symmetric, no curvature. ROM normal. No CVA tenderness. Cardio: regular rate and rhythm, S1, S2 normal, no murmur, click, rub or gallop GI: soft, non-tender; bowel sounds normal; no masses,  no organomegaly Extremities: extremities normal, atraumatic, no cyanosis or edema Neurologic: Alert and oriented X 3, normal strength and tone. Normal symmetric reflexes. Normal coordination and gait  ECOG PERFORMANCE STATUS: 0 - Asymptomatic  Blood pressure 151/65, pulse 64, temperature 97.9 F (36.6 C), temperature source Oral, resp. rate 18, height 5\' 2"  (1.575 m), weight 146 lb 6.4 oz (66.407 kg).  LABORATORY DATA: Lab Results  Component Value Date   WBC 3.8* 12/29/2013   HGB 13.1 12/29/2013   HCT 39.9 12/29/2013   MCV 99.8 12/29/2013   PLT 269 12/29/2013      Chemistry      Component Value Date/Time   NA 141 12/29/2013 0849   NA 139 01/24/2012 1008   NA 140 08/21/2011 1306   K 4.1 12/29/2013 0849   K 4.2 01/24/2012 1008   K 4.1 08/21/2011 1306   CL 108* 11/04/2012 0947   CL 103 01/24/2012 1008   CL 107 08/21/2011 1306   CO2 23 12/29/2013 0849   CO2 28 01/24/2012 1008   CO2 22 08/21/2011 1306   BUN 14.3 12/29/2013 0849   BUN 13 01/24/2012 1008   BUN 11 08/21/2011 1306   CREATININE 0.9 12/29/2013 0849   CREATININE 0.8 01/24/2012 1008   CREATININE 0.81 08/21/2011 1306      Component Value Date/Time   CALCIUM 9.0 12/29/2013 0849   CALCIUM 8.6 01/24/2012 1008   CALCIUM 8.8 08/21/2011 1306   ALKPHOS 91 12/29/2013 0849   ALKPHOS 79 01/24/2012 1008   ALKPHOS 87 08/21/2011 1306   AST 21 12/29/2013 0849   AST 24 01/24/2012 1008   AST 17 08/21/2011 1306   ALT 10 12/29/2013 0849   ALT 21 01/24/2012 1008   ALT 8 08/21/2011 1306   BILITOT 0.58 12/29/2013 0849   BILITOT 0.90 01/24/2012 1008   BILITOT 0.6 08/21/2011 1306       RADIOGRAPHIC STUDIES: Ct Chest Wo Contrast  12/07/2013    CLINICAL DATA:  Lung cancer diagnosed in 2008 with history of left lower lobectomy. Chemotherapy ongoing.  EXAM: CT CHEST WITHOUT CONTRAST  TECHNIQUE: Multidetector CT imaging of the chest was performed following the standard protocol without IV contrast.  COMPARISON:  Chest CTs dated 08/10/2013, 05/11/2013 and 02/15/2013.  FINDINGS: Within the limitations of noncontrast technique, the mediastinum has a stable appearance without evidence of adenopathy. Right IJ Port-A-Cath tip extends to the SVC right atrial junction. There is atherosclerosis of the aorta and coronary arteries. The heart size is normal. There is no pleural or pericardial effusion. A small hiatal hernia is unchanged.  There has been interval enlargement of the ill-defined right upper lobe mass abutting the minor fissure. This measures up to 3.8 x 3.5 cm transverse on image 23 and is associated with air bronchograms inferiorly. There is adjacent airspace disease extending anteriorly along the minor fissure.  11 mm right middle lobe nodular density on image 42 appears stable (most discrete on sagittal image 41). The  sub solid subpleural lesion in the left upper lobe appears slightly larger and more dense, measuring 13 mm on image 35. 4 mm subpleural left upper lobe nodule on image 23 is unchanged. There is no endobronchial lesion. The left lower lobe has been previously resected.  The visualized upper abdomen has a stable appearance. There is no adrenal mass.  Thoracic spine Schmorl's nodes are stable. There are no worrisome osseous findings.  IMPRESSION: 1. Progressive enlargement of dominant right upper lobe ill-defined mass consistent with tumor progression. 2. Nodular densities within the right middle lobe and left lung are similar to prior studies, although the subpleural left upper lobe lesion appears slightly denser. 3. No evidence of adenopathy or pleural effusion. 4. Consider PET-CT for further evaluation of recurrent metabolic activity within  the dominant right upper lobe lesion.   Electronically Signed   By: Camie Patience M.D.   On: 12/07/2013 16:22   Nm Pet Image Restag (ps) Skull Base To Thigh  12/24/2013   CLINICAL DATA:  Subsequent treatment strategy for lung carcinoma.  EXAM: NUCLEAR MEDICINE PET SKULL BASE TO THIGH  TECHNIQUE: 7.6 mCi F-18 FDG was injected intravenously. Full-ring PET imaging was performed from the skull base to thigh after the radiotracer. CT data was obtained and used for attenuation correction and anatomic localization.  FASTING BLOOD GLUCOSE:  Value: 85 mg/dl  COMPARISON:  CT 12/07/2013, 10/27/2009, PET-CT 01/27/2009  FINDINGS: NECK  No hypermetabolic lymph nodes in the neck.  CHEST  Within the right upper lobe there is a 2.5 x 3.2 cm hypermetabolic hypermetabolic mass with SUV max equal 6.1. Extending inferiorly along the fissure there is hypermetabolic nodularity with nodules measure up to 14 mm in thickness (image 77) and with moderate metabolic activity SUV max 2.7. More inferiorly along the oblique fissure in the right middle lobe (image 89) there is a hypermetabolic nodule.  Within the peripheral lingula there is a 16 mm nodule (image 79) with low metabolic activity.  No hypermetabolic mediastinal lymph nodes.  ABDOMEN/PELVIS  No abnormal hypermetabolic activity within the liver, pancreas, adrenal glands, or spleen. No hypermetabolic lymph nodes in the abdomen or pelvis. Cyst associated with the right ovary measuring up to 4.8 cm with no associated metabolic activity and therefore is likely benign.  SKELETON  No focal hypermetabolic activity to suggest skeletal metastasis.  IMPRESSION: 1. Hypermetabolic mass in the right upper lobe consistent with lung cancer recurrence. 2. Hypermetabolic nodularity along the oblique fissure consistent with pleural spread of lung cancer recurrence. 3. Minimally hypermetabolic nodule within the periphery of the lingula is indeterminate but concerning.   Electronically Signed   By:  Suzy Bouchard M.D.   On: 12/24/2013 09:24   ASSESSMENT AND PLAN: This is a very pleasant 80 years old white female with recurrent non-small cell lung cancer, adenocarcinoma. The patient is currently undergoing maintenance chemotherapy with single agent Alimta status post 54 cycles and she is tolerating it fairly well.  I recommended for the patient to proceed with her maintenance chemotherapy with single agent Alimta as scheduled. We'll proceed today with cycle #55 as scheduled.  She will start her palliative radiotherapy next week as scheduled under the care of Dr. Pablo Ledger. She would come back for followup visit in 3 weeks with the next cycle of her chemotherapy after repeating CT scan of the chest for restaging of her disease. She was advised to call immediately if she has any concerning symptoms in the interval.  Disclaimer: This note was dictated with voice  recognition software. Similar sounding words can inadvertently be transcribed and may not be corrected upon review.  [name]., MD 12/29/2013

## 2013-12-29 NOTE — Telephone Encounter (Signed)
gv and rpinted appt sched and avs for pt for Aug and Sept....sed added tx.

## 2014-01-04 ENCOUNTER — Telehealth: Payer: Self-pay | Admitting: *Deleted

## 2014-01-04 NOTE — Telephone Encounter (Signed)
Called patient to ask about coming in @ 8:30 am tomorrow morning per Dr. Pablo Ledger request, spoke with patient and she said that she would try to make that time.

## 2014-01-05 ENCOUNTER — Ambulatory Visit: Admission: RE | Admit: 2014-01-05 | Payer: Medicare Other | Source: Ambulatory Visit | Admitting: Radiation Oncology

## 2014-01-05 ENCOUNTER — Ambulatory Visit
Admission: RE | Admit: 2014-01-05 | Discharge: 2014-01-05 | Disposition: A | Discharge status: Home / Self Care | Payer: Medicare Other | Source: Ambulatory Visit | Attending: Radiation Oncology | Admitting: Radiation Oncology

## 2014-01-05 ENCOUNTER — Encounter: Payer: Self-pay | Admitting: Radiation Oncology

## 2014-01-05 VITALS — BP 118/83 | HR 80 | Temp 97.8°F | Resp 20 | Wt 146.7 lb

## 2014-01-05 DIAGNOSIS — C3411 Malignant neoplasm of upper lobe, right bronchus or lung: Secondary | ICD-10-CM

## 2014-01-05 NOTE — Progress Notes (Signed)
Patient denies pain, fatigue, loss of appetite, SOB. She reports nonproductive cough.

## 2014-01-05 NOTE — Progress Notes (Signed)
   Department of Radiation Oncology  Phone:  [phone] Fax:        [phone]   Name: [name] MRN: [id number]  DOB: March 07, 1933  Date: 01/05/2014  Follow Up Visit Note  Diagnosis: Multifocal bronchoalveolar carcinoma  Interval History: [name] presents today for followup.  She had her follow up PET scan which showed activity in the enlarging nodule and in the pleural disease extending anteriorly from this.  No evidence of mediastinal disease was noted. No metastatic disease was seen. I went over her scans with radiology and she has had significant progression of this pleural disease since March.  She has no cough and no chest pain. Her breathing is stable. She says Dr. Julien Nordmann discussed another chemotherapy agent with her and she has thought about that as well.   Allergies:  Allergies  Allergen Reactions  . Simvastatin Other (See Comments)    Cluster migraines  . Iodinated Diagnostic Agents     Pt states renal insuff with IV cm    Medications:  Current Outpatient Prescriptions  Medication Sig Dispense Refill  . Calcium Carbonate-Vit D-Min (CALTRATE PLUS PO) Take 500 mg by mouth 2 (two) times daily.        . chlorpheniramine-carbetapentane (TUSSI-12) 4-30 MG/5ML SUSP Take by mouth every 12 (twelve) hours as needed.      . folic acid (FOLVITE) 1 MG tablet Take 1 mg by mouth daily.        . Multiple Vitamins-Minerals (CENTRUM SILVER PO) Take 1 tablet by mouth daily.       . potassium gluconate 595 MG TABS Take 595 mg by mouth daily.      . prochlorperazine (COMPAZINE) 10 MG tablet Take 10 mg by mouth every 6 (six) hours as needed.        . ranitidine (ZANTAC) 75 MG tablet Take 75 mg by mouth. Pt takes as needed      . vitamin B-12 (CYANOCOBALAMIN) 500 MCG tablet Take 1,500 mcg by mouth daily.        No current facility-administered medications for this encounter.    Physical Exam:  Filed Vitals:   01/05/14 0850  BP: 118/83  Pulse: 80  Temp: 97.8 F (36.6 C)    TempSrc: Oral  Resp: 20  Weight: 146 lb 11.2 oz (66.543 kg)  SpO2: 100%   Pleasant, alert, no distress  IMPRESSION: [name] is a 80 y.o. female with progressive multifocal adenocarcinoma of the right upper lobe  PLAN:  We had a long talk about what the right thing to do would be. We discussed that it seemed illogical to trat this one nodule when the disease extending from it along the pleura was also growing.  We discussed treating the entire area which is a large amount of normal lung and may result in difficulties with her breathing. We discussed options and I showed her her scans all the way back to 2012. Ultimately, she would like to proceed forward with chemotherapy and I think this is a good decision. It is likely radiation change would occlude disease progression if we just treated this nodule any way.  I have discussed this with Dr. Julien Nordmann and with the patient. I gave her copies of her PET scan as well as pictures of her CT scans to show her daughters/ I have cancelled her simulation for today and am happy to see her back prn.     Thea Silversmith, MD

## 2014-01-18 ENCOUNTER — Ambulatory Visit: Payer: Medicare Other | Admitting: Radiation Oncology

## 2014-01-18 ENCOUNTER — Encounter: Payer: Self-pay | Admitting: Pharmacist

## 2014-01-19 ENCOUNTER — Ambulatory Visit: Payer: Medicare Other | Admitting: Radiation Oncology

## 2014-01-19 ENCOUNTER — Ambulatory Visit: Payer: Medicare Other

## 2014-01-19 ENCOUNTER — Telehealth: Payer: Self-pay | Admitting: Internal Medicine

## 2014-01-19 ENCOUNTER — Encounter: Payer: Self-pay | Admitting: Physician Assistant

## 2014-01-19 ENCOUNTER — Other Ambulatory Visit (HOSPITAL_BASED_OUTPATIENT_CLINIC_OR_DEPARTMENT_OTHER): Payer: Medicare Other

## 2014-01-19 ENCOUNTER — Ambulatory Visit (HOSPITAL_BASED_OUTPATIENT_CLINIC_OR_DEPARTMENT_OTHER): Payer: Medicare Other | Admitting: Physician Assistant

## 2014-01-19 VITALS — BP 135/70 | HR 66 | Temp 98.3°F | Resp 19 | Ht 62.0 in | Wt 146.9 lb

## 2014-01-19 DIAGNOSIS — C3410 Malignant neoplasm of upper lobe, unspecified bronchus or lung: Secondary | ICD-10-CM

## 2014-01-19 DIAGNOSIS — C3490 Malignant neoplasm of unspecified part of unspecified bronchus or lung: Secondary | ICD-10-CM

## 2014-01-19 DIAGNOSIS — C782 Secondary malignant neoplasm of pleura: Secondary | ICD-10-CM

## 2014-01-19 DIAGNOSIS — C3491 Malignant neoplasm of unspecified part of right bronchus or lung: Secondary | ICD-10-CM

## 2014-01-19 LAB — CBC WITH DIFFERENTIAL/PLATELET
BASO%: 0.5 % (ref 0.0–2.0)
BASOS ABS: 0 10*3/uL (ref 0.0–0.1)
EOS ABS: 0 10*3/uL (ref 0.0–0.5)
EOS%: 0.7 % (ref 0.0–7.0)
HCT: 38.2 % (ref 34.8–46.6)
HEMOGLOBIN: 12.5 g/dL (ref 11.6–15.9)
LYMPH#: 1.5 10*3/uL (ref 0.9–3.3)
LYMPH%: 43 % (ref 14.0–49.7)
MCH: 32.7 pg (ref 25.1–34.0)
MCHC: 32.7 g/dL (ref 31.5–36.0)
MCV: 100 fL (ref 79.5–101.0)
MONO#: 0.4 10*3/uL (ref 0.1–0.9)
MONO%: 10.4 % (ref 0.0–14.0)
NEUT%: 45.4 % (ref 38.4–76.8)
NEUTROS ABS: 1.6 10*3/uL (ref 1.5–6.5)
Platelets: 226 10*3/uL (ref 145–400)
RBC: 3.82 10*6/uL (ref 3.70–5.45)
RDW: 15.2 % — AB (ref 11.2–14.5)
WBC: 3.5 10*3/uL — AB (ref 3.9–10.3)

## 2014-01-19 LAB — COMPREHENSIVE METABOLIC PANEL (CC13)
ALBUMIN: 3.4 g/dL — AB (ref 3.5–5.0)
ALT: 8 U/L (ref 0–55)
AST: 21 U/L (ref 5–34)
Alkaline Phosphatase: 79 U/L (ref 40–150)
Anion Gap: 8 mEq/L (ref 3–11)
BUN: 13.7 mg/dL (ref 7.0–26.0)
CALCIUM: 8.9 mg/dL (ref 8.4–10.4)
CHLORIDE: 109 meq/L (ref 98–109)
CO2: 24 meq/L (ref 22–29)
Creatinine: 0.8 mg/dL (ref 0.6–1.1)
Glucose: 87 mg/dl (ref 70–140)
POTASSIUM: 4.1 meq/L (ref 3.5–5.1)
Sodium: 141 mEq/L (ref 136–145)
Total Bilirubin: 0.54 mg/dL (ref 0.20–1.20)
Total Protein: 6.8 g/dL (ref 6.4–8.3)

## 2014-01-19 NOTE — Progress Notes (Addendum)
[place] [phone]   Fax:(336) [place], [place] [address]  Principle Diagnosis and stage: Recurrent non-small cell lung cancer, initially diagnosed a stage IB (T2, N0, M0) adenocarcinoma in June 2008 with a tumor size of 8 cm.   Prior Therapy:  #1 status post left upper lobectomy with lymph node dissection under the care of Dr. Arlyce Dice on 06/29/2006.  #2 status post 4 cycles of adjuvant chemotherapy with cisplatin and Taxotere last dose given 08/01/2006.  #3 status post 6 cycles of systemic chemotherapy with carboplatin and Alimta for disease recurrence last dose given 12/12/2009.   Current therapy: Maintenance chemotherapy with Alimta at 500 mg per meter squared given every 3 weeks status post 55 cycles.  CHEMOTHERAPY INTENT: Palliative/maintenance.  CURRENT # OF CHEMOTHERAPY CYCLES: 55  CURRENT ANTIEMETICS: Zofran, dexamethasone and Compazine  CURRENT SMOKING STATUS: Never Smoker.   ORAL CHEMOTHERAPY AND CONSENT: None  CURRENT BISPHOSPHONATES USE: None  PAIN MANAGEMENT: None  NARCOTICS INDUCED CONSTIPATION: None  LIVING WILL AND CODE STATUS: has living will for no CODE BLUE.  INTERVAL HISTORY: [name] 80 y.o. female returns to the clinic today for follow up visit. She was seen recently by Dr. Pablo Ledger for evaluation and consideration of palliative radiotherapy to the enlarging right lung mass. She met with Dr. Pablo Ledger regarding potential radiation therapy. Dr. Pablo Ledger did not recommend proceeding with radiation as in addition to the nodule there was further disease along the pleura that was also growing. Recommend that she proceed with chemotherapy.  The patient is feeling fine today with no specific complaints. She is tolerating her maintenance chemotherapy with single agent Alimta fairly well. The patient denied having any significant nausea or vomiting.  She has no fever or chills. The she denied having any significant chest pain, shortness of breath, or hemoptysis. She has no weight loss or night sweats. She is status post 55 cycles of her chemotherapy.  MEDICAL HISTORY: Past Medical History  Diagnosis Date  . History of migraine headaches   . Hypercholesterolemia   . lung ca dx'd 06/2006    rt and lt lung  . Lung cancer 3/14/1    right- Adenocarcinoma w/bronchioalveolar features  . FHx: chemotherapy 2008&2011    4 cycles cisplatin,taxotere,2008& carboplatin and Alimta 2011    ALLERGIES:  is allergic to simvastatin and iodinated diagnostic agents.  MEDICATIONS:  Current Outpatient Prescriptions  Medication Sig Dispense Refill  . Calcium Carbonate-Vit D-Min (CALTRATE PLUS PO) Take 500 mg by mouth 2 (two) times daily.        . chlorpheniramine-carbetapentane (TUSSI-12) 4-30 MG/5ML SUSP Take by mouth every 12 (twelve) hours as needed.      . folic acid (FOLVITE) 1 MG tablet Take 1 mg by mouth daily.        . Multiple Vitamins-Minerals (CENTRUM SILVER PO) Take 1 tablet by mouth daily.       . potassium gluconate 595 MG TABS Take 595 mg by mouth daily.      . ranitidine (ZANTAC) 75 MG tablet Take 75 mg by mouth. Pt takes as needed      . vitamin B-12 (CYANOCOBALAMIN) 500 MCG tablet Take 1,500 mcg by mouth daily.       . prochlorperazine (COMPAZINE) 10 MG tablet Take 10 mg by mouth every 6 (six) hours as needed.         No current facility-administered medications for this visit.  REVIEW OF SYSTEMS:  Constitutional: positive for fatigue Eyes: negative Ears, nose, mouth, throat, and face: negative Respiratory: negative Cardiovascular: negative Gastrointestinal: negative Genitourinary:negative Integument/breast: negative Hematologic/lymphatic: negative Musculoskeletal:negative Neurological: negative Behavioral/Psych: negative Endocrine: negative Allergic/Immunologic: negative   PHYSICAL EXAMINATION: General appearance: alert,  cooperative and no distress Head: Normocephalic, without obvious abnormality, atraumatic Neck: no adenopathy Lymph nodes: Cervical, supraclavicular, and axillary nodes normal. Resp: clear to auscultation bilaterally Back: symmetric, no curvature. ROM normal. No CVA tenderness. Cardio: regular rate and rhythm, S1, S2 normal, no murmur, click, rub or gallop GI: soft, non-tender; bowel sounds normal; no masses,  no organomegaly Extremities: extremities normal, atraumatic, no cyanosis or edema Neurologic: Alert and oriented X 3, normal strength and tone. Normal symmetric reflexes. Normal coordination and gait  ECOG PERFORMANCE STATUS: 0 - Asymptomatic  Blood pressure 135/70, pulse 66, temperature 98.3 F (36.8 C), temperature source Oral, resp. rate 19, height 5' 2" (1.575 m), weight 146 lb 14.4 oz (66.633 kg).  LABORATORY DATA: Lab Results  Component Value Date   WBC 3.5* 01/19/2014   HGB 12.5 01/19/2014   HCT 38.2 01/19/2014   MCV 100.0 01/19/2014   PLT 226 01/19/2014      Chemistry      Component Value Date/Time   NA 141 01/19/2014 1118   NA 139 01/24/2012 1008   NA 140 08/21/2011 1306   K 4.1 01/19/2014 1118   K 4.2 01/24/2012 1008   K 4.1 08/21/2011 1306   CL 108* 11/04/2012 0947   CL 103 01/24/2012 1008   CL 107 08/21/2011 1306   CO2 24 01/19/2014 1118   CO2 28 01/24/2012 1008   CO2 22 08/21/2011 1306   BUN 13.7 01/19/2014 1118   BUN 13 01/24/2012 1008   BUN 11 08/21/2011 1306   CREATININE 0.8 01/19/2014 1118   CREATININE 0.8 01/24/2012 1008   CREATININE 0.81 08/21/2011 1306      Component Value Date/Time   CALCIUM 8.9 01/19/2014 1118   CALCIUM 8.6 01/24/2012 1008   CALCIUM 8.8 08/21/2011 1306   ALKPHOS 79 01/19/2014 1118   ALKPHOS 79 01/24/2012 1008   ALKPHOS 87 08/21/2011 1306   AST 21 01/19/2014 1118   AST 24 01/24/2012 1008   AST 17 08/21/2011 1306   ALT 8 01/19/2014 1118   ALT 21 01/24/2012 1008   ALT 8 08/21/2011 1306   BILITOT 0.54 01/19/2014 1118   BILITOT 0.90 01/24/2012 1008    BILITOT 0.6 08/21/2011 1306       RADIOGRAPHIC STUDIES: Ct Chest Wo Contrast  12/07/2013   CLINICAL DATA:  Lung cancer diagnosed in 2008 with history of left lower lobectomy. Chemotherapy ongoing.  EXAM: CT CHEST WITHOUT CONTRAST  TECHNIQUE: Multidetector CT imaging of the chest was performed following the standard protocol without IV contrast.  COMPARISON:  Chest CTs dated 08/10/2013, 05/11/2013 and 02/15/2013.  FINDINGS: Within the limitations of noncontrast technique, the mediastinum has a stable appearance without evidence of adenopathy. Right IJ Port-A-Cath tip extends to the SVC right atrial junction. There is atherosclerosis of the aorta and coronary arteries. The heart size is normal. There is no pleural or pericardial effusion. A small hiatal hernia is unchanged.  There has been interval enlargement of the ill-defined right upper lobe mass abutting the minor fissure. This measures up to 3.8 x 3.5 cm transverse on image 23 and is associated with air bronchograms inferiorly. There is adjacent airspace disease extending anteriorly along the minor fissure.  11 mm right middle lobe nodular density on image 42  appears stable (most discrete on sagittal image 41). The sub solid subpleural lesion in the left upper lobe appears slightly larger and more dense, measuring 13 mm on image 35. 4 mm subpleural left upper lobe nodule on image 23 is unchanged. There is no endobronchial lesion. The left lower lobe has been previously resected.  The visualized upper abdomen has a stable appearance. There is no adrenal mass.  Thoracic spine Schmorl's nodes are stable. There are no worrisome osseous findings.  IMPRESSION: 1. Progressive enlargement of dominant right upper lobe ill-defined mass consistent with tumor progression. 2. Nodular densities within the right middle lobe and left lung are similar to prior studies, although the subpleural left upper lobe lesion appears slightly denser. 3. No evidence of adenopathy or  pleural effusion. 4. Consider PET-CT for further evaluation of recurrent metabolic activity within the dominant right upper lobe lesion.   Electronically Signed   By: Camie Patience M.D.   On: 12/07/2013 16:22   Nm Pet Image Restag (ps) Skull Base To Thigh  12/24/2013   CLINICAL DATA:  Subsequent treatment strategy for lung carcinoma.  EXAM: NUCLEAR MEDICINE PET SKULL BASE TO THIGH  TECHNIQUE: 7.6 mCi F-18 FDG was injected intravenously. Full-ring PET imaging was performed from the skull base to thigh after the radiotracer. CT data was obtained and used for attenuation correction and anatomic localization.  FASTING BLOOD GLUCOSE:  Value: 85 mg/dl  COMPARISON:  CT 12/07/2013, 10/27/2009, PET-CT 01/27/2009  FINDINGS: NECK  No hypermetabolic lymph nodes in the neck.  CHEST  Within the right upper lobe there is a 2.5 x 3.2 cm hypermetabolic hypermetabolic mass with SUV max equal 6.1. Extending inferiorly along the fissure there is hypermetabolic nodularity with nodules measure up to 14 mm in thickness (image 77) and with moderate metabolic activity SUV max 2.7. More inferiorly along the oblique fissure in the right middle lobe (image 89) there is a hypermetabolic nodule.  Within the peripheral lingula there is a 16 mm nodule (image 79) with low metabolic activity.  No hypermetabolic mediastinal lymph nodes.  ABDOMEN/PELVIS  No abnormal hypermetabolic activity within the liver, pancreas, adrenal glands, or spleen. No hypermetabolic lymph nodes in the abdomen or pelvis. Cyst associated with the right ovary measuring up to 4.8 cm with no associated metabolic activity and therefore is likely benign.  SKELETON  No focal hypermetabolic activity to suggest skeletal metastasis.  IMPRESSION: 1. Hypermetabolic mass in the right upper lobe consistent with lung cancer recurrence. 2. Hypermetabolic nodularity along the oblique fissure consistent with pleural spread of lung cancer recurrence. 3. Minimally hypermetabolic nodule  within the periphery of the lingula is indeterminate but concerning.   Electronically Signed   By: Suzy Bouchard M.D.   On: 12/24/2013 09:24   ASSESSMENT AND PLAN: This is a very pleasant 80 years old white female with recurrent non-small cell lung cancer, adenocarcinoma. The patient is currently undergoing maintenance chemotherapy with single agent Alimta status post 55 cycles and she is tolerating it fairly well. Patient was discussed with him also seen by Dr. Julien Nordmann. As a radiation therapy is not an option at this point he discussed adding back carboplatin to the Alimta. Ms. Vandevender is in agreement with proceeding with the addition of carboplatin. She will require a test dose to be added to her care plan. I will make pharmacy aware of the need for the carboplatin test dose. She will return in one week to resume systemic chemotherapy with carboplatin and Alimta. She'll have weekly labs and  return in 4 weeks at the start of cycle #2 for another symptom management visit.  She was advised to call immediately if she has any concerning symptoms in the interval.  Disclaimer: This note was dictated with voice recognition software. Similar sounding words can inadvertently be transcribed and may not be corrected upon review.  Carlton Adam, [name] 01/19/2014  ADDENDUM: Hematology/Oncology Attending: I had a face to face encounter with the patient. I recommended her care plan. This is a very pleasant 80 years old white female with recurrent non-small cell lung cancer, adenocarcinoma has been on maintenance treatment with single agent Alimta status post 55 cycles. Her recent scan showed evidence for disease progression and the patient was seen by Dr. Pablo Ledger for consideration of palliative radiotherapy to the enlarging lung nodule. Upon further review of the scan with radiology, there was evidence for disease progression and several other areas including pleural based metastasis. The patient is here today  for evaluation and discussion of her treatment options. I discussed with the patient other treatment options including adding carboplatin to her current treatment with Alimta for at least 3 cycles.  The patient agreed to the current plan and carboplatin will be added to her current treatment with Alimta starting from the next cycle. She will come back for followup visit in 4 weeks with the second cycle of this treatment. She was advised to call immediately if she has any concerning symptoms in the interval.   Disclaimer: This note was dictated with voice recognition software. Similar sounding words can inadvertently be transcribed and may be missed upon review. [name]., MD 01/22/2014

## 2014-01-19 NOTE — Telephone Encounter (Signed)
gv and pritned appt sched and avs for pt for Aug and Sept

## 2014-01-20 ENCOUNTER — Telehealth: Payer: Self-pay | Admitting: Medical Oncology

## 2014-01-20 ENCOUNTER — Ambulatory Visit: Payer: Medicare Other | Admitting: Radiation Oncology

## 2014-01-20 DIAGNOSIS — C3490 Malignant neoplasm of unspecified part of unspecified bronchus or lung: Secondary | ICD-10-CM

## 2014-01-20 NOTE — Telephone Encounter (Signed)
Pt asking if she continues decadron pre chemo . I told her yes and reviewed instructions. She has a bottle of tablets from 2013 that she wanted to use and i told her to let me know if she wants me to call in new rx. She said " not now"

## 2014-01-21 ENCOUNTER — Ambulatory Visit: Payer: Medicare Other | Admitting: Radiation Oncology

## 2014-01-21 ENCOUNTER — Other Ambulatory Visit: Payer: Self-pay | Admitting: *Deleted

## 2014-01-21 DIAGNOSIS — C3490 Malignant neoplasm of unspecified part of unspecified bronchus or lung: Secondary | ICD-10-CM

## 2014-01-21 MED ORDER — DEXAMETHASONE 4 MG PO TABS: 4.0000 mg | ORAL_TABLET | ORAL | Status: AC

## 2014-01-21 MED ORDER — PROCHLORPERAZINE MALEATE 10 MG PO TABS: 10.0000 mg | ORAL_TABLET | Freq: Four times a day (QID) | ORAL | Status: DC | PRN

## 2014-01-21 NOTE — Patient Instructions (Signed)
Your recent PET scan revealed evidence for disease progression. As per discussion with Dr. Pablo Ledger, radiation therapy is not an option at this time. Per discussion with Dr. Julien Nordmann we will continue with chemotherapy however we'll add carboplatin to your Alimta. He will begin this course of chemotherapy in one week and followup in 4 weeks for another symptom management visit. Continue weekly labs as scheduled

## 2014-01-24 ENCOUNTER — Ambulatory Visit: Payer: Medicare Other | Admitting: Radiation Oncology

## 2014-01-25 ENCOUNTER — Ambulatory Visit: Payer: Medicare Other | Admitting: Radiation Oncology

## 2014-01-26 ENCOUNTER — Ambulatory Visit (HOSPITAL_BASED_OUTPATIENT_CLINIC_OR_DEPARTMENT_OTHER): Payer: Medicare Other

## 2014-01-26 ENCOUNTER — Other Ambulatory Visit (HOSPITAL_BASED_OUTPATIENT_CLINIC_OR_DEPARTMENT_OTHER): Payer: Medicare Other

## 2014-01-26 ENCOUNTER — Ambulatory Visit: Payer: Medicare Other | Admitting: Radiation Oncology

## 2014-01-26 VITALS — BP 123/56 | HR 66 | Temp 97.9°F | Resp 18

## 2014-01-26 DIAGNOSIS — Z5111 Encounter for antineoplastic chemotherapy: Secondary | ICD-10-CM

## 2014-01-26 DIAGNOSIS — C3410 Malignant neoplasm of upper lobe, unspecified bronchus or lung: Secondary | ICD-10-CM

## 2014-01-26 DIAGNOSIS — C3431 Malignant neoplasm of lower lobe, right bronchus or lung: Secondary | ICD-10-CM

## 2014-01-26 DIAGNOSIS — C3490 Malignant neoplasm of unspecified part of unspecified bronchus or lung: Secondary | ICD-10-CM

## 2014-01-26 LAB — CBC WITH DIFFERENTIAL/PLATELET
BASO%: 0.2 % (ref 0.0–2.0)
Basophils Absolute: 0 10*3/uL (ref 0.0–0.1)
EOS%: 0 % (ref 0.0–7.0)
Eosinophils Absolute: 0 10*3/uL (ref 0.0–0.5)
HCT: 37.7 % (ref 34.8–46.6)
HGB: 12.5 g/dL (ref 11.6–15.9)
LYMPH%: 8.8 % — ABNORMAL LOW (ref 14.0–49.7)
MCH: 33.1 pg (ref 25.1–34.0)
MCHC: 33.1 g/dL (ref 31.5–36.0)
MCV: 99.9 fL (ref 79.5–101.0)
MONO#: 0.2 10*3/uL (ref 0.1–0.9)
MONO%: 2.4 % (ref 0.0–14.0)
NEUT%: 88.6 % — ABNORMAL HIGH (ref 38.4–76.8)
NEUTROS ABS: 7.4 10*3/uL — AB (ref 1.5–6.5)
Platelets: 225 10*3/uL (ref 145–400)
RBC: 3.77 10*6/uL (ref 3.70–5.45)
RDW: 14.7 % — AB (ref 11.2–14.5)
WBC: 8.3 10*3/uL (ref 3.9–10.3)
lymph#: 0.7 10*3/uL — ABNORMAL LOW (ref 0.9–3.3)

## 2014-01-26 LAB — COMPREHENSIVE METABOLIC PANEL (CC13)
ALBUMIN: 3.5 g/dL (ref 3.5–5.0)
ALK PHOS: 81 U/L (ref 40–150)
ALT: 11 U/L (ref 0–55)
AST: 20 U/L (ref 5–34)
Anion Gap: 7 mEq/L (ref 3–11)
BUN: 23.2 mg/dL (ref 7.0–26.0)
CO2: 23 mEq/L (ref 22–29)
Calcium: 9 mg/dL (ref 8.4–10.4)
Chloride: 109 mEq/L (ref 98–109)
Creatinine: 0.8 mg/dL (ref 0.6–1.1)
Glucose: 136 mg/dl (ref 70–140)
Potassium: 4.3 mEq/L (ref 3.5–5.1)
SODIUM: 139 meq/L (ref 136–145)
Total Bilirubin: 0.43 mg/dL (ref 0.20–1.20)
Total Protein: 7.1 g/dL (ref 6.4–8.3)

## 2014-01-26 MED ORDER — CYANOCOBALAMIN 1000 MCG/ML IJ SOLN
INTRAMUSCULAR | Status: AC
  Filled 2014-01-26: qty 1

## 2014-01-26 MED ORDER — SODIUM CHLORIDE 0.9 % IJ SOLN
10.0000 mL | INTRAMUSCULAR | Status: DC | PRN
  Administered 2014-01-26: 10 mL
  Filled 2014-01-26: qty 10

## 2014-01-26 MED ORDER — PEMETREXED DISODIUM CHEMO INJECTION 500 MG
500.0000 mg/m2 | Freq: Once | INTRAVENOUS | Status: AC
  Administered 2014-01-26: 850 mg via INTRAVENOUS
  Filled 2014-01-26: qty 34

## 2014-01-26 MED ORDER — ONDANSETRON 16 MG/50ML IVPB (CHCC)
16.0000 mg | Freq: Once | INTRAVENOUS | Status: AC
  Administered 2014-01-26: 16 mg via INTRAVENOUS

## 2014-01-26 MED ORDER — SODIUM CHLORIDE 0.9 % IV SOLN
Freq: Once | INTRAVENOUS | Status: AC
  Administered 2014-01-26: 14:00:00 via INTRAVENOUS

## 2014-01-26 MED ORDER — DEXAMETHASONE SODIUM PHOSPHATE 20 MG/5ML IJ SOLN
INTRAMUSCULAR | Status: AC
Start: 2014-01-26 — End: 2014-01-26
  Filled 2014-01-26: qty 5

## 2014-01-26 MED ORDER — DEXAMETHASONE SODIUM PHOSPHATE 20 MG/5ML IJ SOLN
20.0000 mg | Freq: Once | INTRAMUSCULAR | Status: AC
  Administered 2014-01-26: 20 mg via INTRAVENOUS

## 2014-01-26 MED ORDER — CARBOPLATIN CHEMO INTRADERMAL TEST DOSE 100MCG/0.02ML
100.0000 ug | Freq: Once | INTRADERMAL | Status: AC
  Administered 2014-01-26: 100 ug via INTRADERMAL
  Filled 2014-01-26: qty 0.01

## 2014-01-26 MED ORDER — HEPARIN SOD (PORK) LOCK FLUSH 100 UNIT/ML IV SOLN
500.0000 [IU] | Freq: Once | INTRAVENOUS | Status: AC | PRN
  Administered 2014-01-26: 500 [IU]
  Filled 2014-01-26: qty 5

## 2014-01-26 MED ORDER — CYANOCOBALAMIN 1000 MCG/ML IJ SOLN
1000.0000 ug | Freq: Once | INTRAMUSCULAR | Status: AC
  Administered 2014-01-26: 1000 ug via INTRAMUSCULAR

## 2014-01-26 MED ORDER — SODIUM CHLORIDE 0.9 % IV SOLN
365.0000 mg | Freq: Once | INTRAVENOUS | Status: AC
  Administered 2014-01-26: 370 mg via INTRAVENOUS
  Filled 2014-01-26: qty 37

## 2014-01-26 MED ORDER — ONDANSETRON 16 MG/50ML IVPB (CHCC)
INTRAVENOUS | Status: AC
  Filled 2014-01-26: qty 16

## 2014-01-26 NOTE — Patient Instructions (Signed)
[facility] Discharge Instructions for Patients Receiving Chemotherapy  Today you received the following chemotherapy agents Alimta and Carboplatin.  To help prevent nausea and vomiting after your treatment, we encourage you to take your nausea medication.   If you develop nausea and vomiting that is not controlled by your nausea medication, call the clinic.   BELOW ARE SYMPTOMS THAT SHOULD BE REPORTED IMMEDIATELY:  *FEVER GREATER THAN 100.5 F  *CHILLS WITH OR WITHOUT FEVER  NAUSEA AND VOMITING THAT IS NOT CONTROLLED WITH YOUR NAUSEA MEDICATION  *UNUSUAL SHORTNESS OF BREATH  *UNUSUAL BRUISING OR BLEEDING  TENDERNESS IN MOUTH AND THROAT WITH OR WITHOUT PRESENCE OF ULCERS  *URINARY PROBLEMS  *BOWEL PROBLEMS  UNUSUAL RASH Items with * indicate a potential emergency and should be followed up as soon as possible.  Feel free to call the clinic you have any questions or concerns. The clinic phone number is [phone].

## 2014-01-27 ENCOUNTER — Ambulatory Visit: Payer: Medicare Other | Admitting: Radiation Oncology

## 2014-01-28 ENCOUNTER — Ambulatory Visit: Payer: Medicare Other | Admitting: Radiation Oncology

## 2014-01-31 ENCOUNTER — Ambulatory Visit: Payer: Medicare Other | Admitting: Radiation Oncology

## 2014-02-09 ENCOUNTER — Ambulatory Visit: Payer: Medicare Other

## 2014-02-09 ENCOUNTER — Other Ambulatory Visit: Payer: Medicare Other

## 2014-02-16 ENCOUNTER — Ambulatory Visit (HOSPITAL_BASED_OUTPATIENT_CLINIC_OR_DEPARTMENT_OTHER): Payer: Medicare Other | Admitting: Nurse Practitioner

## 2014-02-16 ENCOUNTER — Ambulatory Visit (HOSPITAL_BASED_OUTPATIENT_CLINIC_OR_DEPARTMENT_OTHER): Payer: Medicare Other

## 2014-02-16 ENCOUNTER — Telehealth: Payer: Self-pay | Admitting: Internal Medicine

## 2014-02-16 ENCOUNTER — Other Ambulatory Visit (HOSPITAL_BASED_OUTPATIENT_CLINIC_OR_DEPARTMENT_OTHER): Payer: Medicare Other

## 2014-02-16 VITALS — BP 125/72 | HR 63 | Temp 98.4°F | Resp 18 | Ht 62.0 in | Wt 147.4 lb

## 2014-02-16 DIAGNOSIS — C3410 Malignant neoplasm of upper lobe, unspecified bronchus or lung: Secondary | ICD-10-CM

## 2014-02-16 DIAGNOSIS — Z5111 Encounter for antineoplastic chemotherapy: Secondary | ICD-10-CM

## 2014-02-16 DIAGNOSIS — R0989 Other specified symptoms and signs involving the circulatory and respiratory systems: Secondary | ICD-10-CM

## 2014-02-16 DIAGNOSIS — R05 Cough: Secondary | ICD-10-CM

## 2014-02-16 DIAGNOSIS — C3431 Malignant neoplasm of lower lobe, right bronchus or lung: Secondary | ICD-10-CM

## 2014-02-16 DIAGNOSIS — R059 Cough, unspecified: Secondary | ICD-10-CM

## 2014-02-16 DIAGNOSIS — R0609 Other forms of dyspnea: Secondary | ICD-10-CM

## 2014-02-16 DIAGNOSIS — C3490 Malignant neoplasm of unspecified part of unspecified bronchus or lung: Secondary | ICD-10-CM

## 2014-02-16 LAB — CBC WITH DIFFERENTIAL/PLATELET
BASO%: 0.3 % (ref 0.0–2.0)
Basophils Absolute: 0 10*3/uL (ref 0.0–0.1)
EOS%: 0 % (ref 0.0–7.0)
Eosinophils Absolute: 0 10*3/uL (ref 0.0–0.5)
HCT: 38.5 % (ref 34.8–46.6)
HGB: 12.6 g/dL (ref 11.6–15.9)
LYMPH%: 20.7 % (ref 14.0–49.7)
MCH: 33 pg (ref 25.1–34.0)
MCHC: 32.6 g/dL (ref 31.5–36.0)
MCV: 101.1 fL — ABNORMAL HIGH (ref 79.5–101.0)
MONO#: 0.9 10*3/uL (ref 0.1–0.9)
MONO%: 13.5 % (ref 0.0–14.0)
NEUT#: 4.2 10*3/uL (ref 1.5–6.5)
NEUT%: 65.5 % (ref 38.4–76.8)
Platelets: 243 10*3/uL (ref 145–400)
RBC: 3.81 10*6/uL (ref 3.70–5.45)
RDW: 14.7 % — ABNORMAL HIGH (ref 11.2–14.5)
WBC: 6.4 10*3/uL (ref 3.9–10.3)
lymph#: 1.3 10*3/uL (ref 0.9–3.3)

## 2014-02-16 LAB — COMPREHENSIVE METABOLIC PANEL (CC13)
ALK PHOS: 79 U/L (ref 40–150)
ALT: 6 U/L (ref 0–55)
AST: 18 U/L (ref 5–34)
Albumin: 3.6 g/dL (ref 3.5–5.0)
Anion Gap: 12 mEq/L — ABNORMAL HIGH (ref 3–11)
BILIRUBIN TOTAL: 0.67 mg/dL (ref 0.20–1.20)
BUN: 16.1 mg/dL (ref 7.0–26.0)
CO2: 20 mEq/L — ABNORMAL LOW (ref 22–29)
Calcium: 9.1 mg/dL (ref 8.4–10.4)
Chloride: 107 mEq/L (ref 98–109)
Creatinine: 0.8 mg/dL (ref 0.6–1.1)
GLUCOSE: 90 mg/dL (ref 70–140)
Potassium: 3.9 mEq/L (ref 3.5–5.1)
SODIUM: 139 meq/L (ref 136–145)
Total Protein: 7.1 g/dL (ref 6.4–8.3)

## 2014-02-16 MED ORDER — DEXAMETHASONE SODIUM PHOSPHATE 20 MG/5ML IJ SOLN
20.0000 mg | Freq: Once | INTRAMUSCULAR | Status: AC
  Administered 2014-02-16: 20 mg via INTRAVENOUS

## 2014-02-16 MED ORDER — CARBOPLATIN CHEMO INTRADERMAL TEST DOSE 100MCG/0.02ML
100.0000 ug | Freq: Once | INTRADERMAL | Status: AC
  Administered 2014-02-16: 100 ug via INTRADERMAL
  Filled 2014-02-16: qty 0.01

## 2014-02-16 MED ORDER — DEXAMETHASONE SODIUM PHOSPHATE 20 MG/5ML IJ SOLN
INTRAMUSCULAR | Status: AC
  Filled 2014-02-16: qty 5

## 2014-02-16 MED ORDER — SODIUM CHLORIDE 0.9 % IV SOLN
Freq: Once | INTRAVENOUS | Status: AC
  Administered 2014-02-16: 14:00:00 via INTRAVENOUS

## 2014-02-16 MED ORDER — ONDANSETRON 16 MG/50ML IVPB (CHCC)
16.0000 mg | Freq: Once | INTRAVENOUS | Status: AC
  Administered 2014-02-16: 16 mg via INTRAVENOUS

## 2014-02-16 MED ORDER — SODIUM CHLORIDE 0.9 % IJ SOLN
10.0000 mL | INTRAMUSCULAR | Status: DC | PRN
  Administered 2014-02-16: 10 mL
  Filled 2014-02-16: qty 10

## 2014-02-16 MED ORDER — ONDANSETRON 16 MG/50ML IVPB (CHCC)
INTRAVENOUS | Status: AC
  Filled 2014-02-16: qty 16

## 2014-02-16 MED ORDER — PEMETREXED DISODIUM CHEMO INJECTION 500 MG
500.0000 mg/m2 | Freq: Once | INTRAVENOUS | Status: AC
  Administered 2014-02-16: 850 mg via INTRAVENOUS
  Filled 2014-02-16: qty 34

## 2014-02-16 MED ORDER — HEPARIN SOD (PORK) LOCK FLUSH 100 UNIT/ML IV SOLN
500.0000 [IU] | Freq: Once | INTRAVENOUS | Status: AC | PRN
  Administered 2014-02-16: 500 [IU]
  Filled 2014-02-16: qty 5

## 2014-02-16 NOTE — Progress Notes (Addendum)
  [facility] OFFICE PROGRESS NOTE   Diagnosis:  Recurrent non-small cell lung cancer.  INTERVAL HISTORY:   [name] returns as scheduled. She completed carboplatin/Alimta on 01/26/2014. She denies nausea/vomiting. No mouth sores. No diarrhea. She tends to note increased constipation for 3-4 days after the chemotherapy. The constipation is relieved with applesauce. She denies pain. She has stable mild dyspnea on exertion. She has a chronic cough. No hemoptysis. No fever. She reports a good appetite. No urinary symptoms. Her main complaint following the chemotherapy was fatigue for 6-7 days. She denies any signs or symptoms of an allergic reaction.  Objective:  Vital signs in last 24 hours:  Blood pressure 125/72, pulse 63, temperature 98.4 F (36.9 C), temperature source Oral, resp. rate 18, height 5\' 2"  (1.575 m), weight 147 lb 6.4 oz (66.86 kg).    HEENT: No thrush or ulcers. Lymphatics: No palpable cervical, supraclavicular or axillary lymph nodes. Resp: Lungs clear bilaterally. No wheezes or rales. Cardio: Regular rate and rhythm. GI: Abdomen soft and nontender. No hepatomegaly. Vascular: No leg edema. Calves soft and nontender. Neuro: Alert and oriented. Gait normal.  Skin: No rash.  Port-A-Cath site without erythema    Lab Results:  Lab Results  Component Value Date   WBC 6.4 02/16/2014   HGB 12.6 02/16/2014   HCT 38.5 02/16/2014   MCV 101.1* 02/16/2014   PLT 243 02/16/2014   NEUTROABS 4.2 02/16/2014    Imaging:  No results found.  Medications: I have reviewed the patient's current medications.  Assessment/Plan: 1. Recurrent non-small cell lung cancer, initially diagnosed stage IB (T2, N0, M0) adenocarcinoma June 2008 with a tumor size of 8 cm. Status post left upper lobectomy with lymph node dissection 06/29/2006. Status post 4 cycles of adjuvant chemotherapy with cisplatin and Taxotere. Status post 6 cycles of systemic chemotherapy with carboplatin  and Alimta for disease recurrence with last dose given 12/12/2009. Maintenance Alimta every 3 weeks for 55 cycles with last dose given 12/29/2013. Alimta plus carboplatin for disease progression with first cycle given 01/26/2014.   Disposition: Ms. [name] appears stable. Plan to proceed with cycle 2 Alimta plus carboplatin today as scheduled. She will return for followup visit and cycle 3 in 3 weeks. She will contact the office in the interim with any problems.    Ned Card ANP/GNP-BC   02/16/2014  12:59 PM  ADDENDUM: Hematology/Oncology Attending: I had a face to face encounter with the patient. I recommended her care plan. This is a very pleasant 80 years old white female with recurrent non-small cell lung cancer. She is currently undergoing systemic chemotherapy with carboplatin and Alimta status post 1 cycle. Unfortunately the patient has hypersensitivity reaction to the test dose of carboplatin today. I recommended for her to proceed treatment with single agent Alimta for now. She would come back for followup visit in 3 weeks with the next cycle of her treatment with Alimta. She was advised to call immediately if she has any concerning symptoms in the interval.  Disclaimer: This note was dictated with voice recognition software. Similar sounding words can inadvertently be transcribed and may be missed upon review. [name]., MD 02/19/2014

## 2014-02-16 NOTE — Progress Notes (Signed)
1520 Per MD, carbo to be held today d/t positive skin reaction. Proceed with Alimta.

## 2014-02-16 NOTE — Telephone Encounter (Signed)
gv adn pritned appt sched and avs for pt for OCT....sed added tx.

## 2014-02-16 NOTE — Patient Instructions (Signed)
[facility] Discharge Instructions for Patients Receiving Chemotherapy  Today you received the following chemotherapy agent Alimta  To help prevent nausea and vomiting after your treatment, we encourage you to take your nausea medication.   If you develop nausea and vomiting that is not controlled by your nausea medication, call the clinic.   BELOW ARE SYMPTOMS THAT SHOULD BE REPORTED IMMEDIATELY:  *FEVER GREATER THAN 100.5 F  *CHILLS WITH OR WITHOUT FEVER  NAUSEA AND VOMITING THAT IS NOT CONTROLLED WITH YOUR NAUSEA MEDICATION  *UNUSUAL SHORTNESS OF BREATH  *UNUSUAL BRUISING OR BLEEDING  TENDERNESS IN MOUTH AND THROAT WITH OR WITHOUT PRESENCE OF ULCERS  *URINARY PROBLEMS  *BOWEL PROBLEMS  UNUSUAL RASH Items with * indicate a potential emergency and should be followed up as soon as possible.  Feel free to call the clinic you have any questions or concerns. The clinic phone number is [phone].

## 2014-03-02 ENCOUNTER — Other Ambulatory Visit: Payer: Medicare Other

## 2014-03-02 ENCOUNTER — Ambulatory Visit: Payer: Medicare Other

## 2014-03-09 ENCOUNTER — Other Ambulatory Visit (HOSPITAL_BASED_OUTPATIENT_CLINIC_OR_DEPARTMENT_OTHER): Payer: Medicare Other

## 2014-03-09 ENCOUNTER — Ambulatory Visit (HOSPITAL_BASED_OUTPATIENT_CLINIC_OR_DEPARTMENT_OTHER): Payer: Medicare Other | Admitting: Physician Assistant

## 2014-03-09 ENCOUNTER — Telehealth: Payer: Self-pay | Admitting: Internal Medicine

## 2014-03-09 ENCOUNTER — Ambulatory Visit (HOSPITAL_BASED_OUTPATIENT_CLINIC_OR_DEPARTMENT_OTHER): Payer: Medicare Other

## 2014-03-09 VITALS — BP 142/74 | HR 72 | Temp 98.1°F | Resp 18 | Ht 62.0 in | Wt 149.0 lb

## 2014-03-09 DIAGNOSIS — C3431 Malignant neoplasm of lower lobe, right bronchus or lung: Secondary | ICD-10-CM

## 2014-03-09 DIAGNOSIS — C3411 Malignant neoplasm of upper lobe, right bronchus or lung: Secondary | ICD-10-CM

## 2014-03-09 DIAGNOSIS — C3491 Malignant neoplasm of unspecified part of right bronchus or lung: Secondary | ICD-10-CM

## 2014-03-09 DIAGNOSIS — Z5111 Encounter for antineoplastic chemotherapy: Secondary | ICD-10-CM

## 2014-03-09 DIAGNOSIS — Z23 Encounter for immunization: Secondary | ICD-10-CM

## 2014-03-09 DIAGNOSIS — C3490 Malignant neoplasm of unspecified part of unspecified bronchus or lung: Secondary | ICD-10-CM

## 2014-03-09 LAB — CBC WITH DIFFERENTIAL/PLATELET
BASO%: 0.4 % (ref 0.0–2.0)
BASOS ABS: 0 10*3/uL (ref 0.0–0.1)
EOS%: 0.8 % (ref 0.0–7.0)
Eosinophils Absolute: 0 10*3/uL (ref 0.0–0.5)
HCT: 38.2 % (ref 34.8–46.6)
HGB: 12.6 g/dL (ref 11.6–15.9)
LYMPH#: 1.4 10*3/uL (ref 0.9–3.3)
LYMPH%: 42.9 % (ref 14.0–49.7)
MCH: 33.6 pg (ref 25.1–34.0)
MCHC: 32.9 g/dL (ref 31.5–36.0)
MCV: 102.1 fL — ABNORMAL HIGH (ref 79.5–101.0)
MONO#: 0.4 10*3/uL (ref 0.1–0.9)
MONO%: 11 % (ref 0.0–14.0)
NEUT#: 1.5 10*3/uL (ref 1.5–6.5)
NEUT%: 44.9 % (ref 38.4–76.8)
Platelets: 287 10*3/uL (ref 145–400)
RBC: 3.74 10*6/uL (ref 3.70–5.45)
RDW: 14.5 % (ref 11.2–14.5)
WBC: 3.3 10*3/uL — ABNORMAL LOW (ref 3.9–10.3)

## 2014-03-09 LAB — COMPREHENSIVE METABOLIC PANEL (CC13)
ALK PHOS: 83 U/L (ref 40–150)
ALT: 10 U/L (ref 0–55)
AST: 19 U/L (ref 5–34)
Albumin: 3.3 g/dL — ABNORMAL LOW (ref 3.5–5.0)
Anion Gap: 6 mEq/L (ref 3–11)
BUN: 10 mg/dL (ref 7.0–26.0)
CHLORIDE: 110 meq/L — AB (ref 98–109)
CO2: 26 mEq/L (ref 22–29)
CREATININE: 0.8 mg/dL (ref 0.6–1.1)
Calcium: 9 mg/dL (ref 8.4–10.4)
Glucose: 84 mg/dl (ref 70–140)
Potassium: 3.8 mEq/L (ref 3.5–5.1)
Sodium: 142 mEq/L (ref 136–145)
Total Bilirubin: 0.49 mg/dL (ref 0.20–1.20)
Total Protein: 6.7 g/dL (ref 6.4–8.3)

## 2014-03-09 MED ORDER — DEXAMETHASONE SODIUM PHOSPHATE 20 MG/5ML IJ SOLN
20.0000 mg | Freq: Once | INTRAMUSCULAR | Status: AC
  Administered 2014-03-09: 20 mg via INTRAVENOUS

## 2014-03-09 MED ORDER — ONDANSETRON 16 MG/50ML IVPB (CHCC)
INTRAVENOUS | Status: AC
  Filled 2014-03-09: qty 16

## 2014-03-09 MED ORDER — ONDANSETRON 16 MG/50ML IVPB (CHCC)
16.0000 mg | Freq: Once | INTRAVENOUS | Status: AC
  Administered 2014-03-09: 16 mg via INTRAVENOUS

## 2014-03-09 MED ORDER — DEXAMETHASONE SODIUM PHOSPHATE 20 MG/5ML IJ SOLN
INTRAMUSCULAR | Status: AC
  Filled 2014-03-09: qty 5

## 2014-03-09 MED ORDER — INFLUENZA VAC SPLIT QUAD 0.5 ML IM SUSY
0.5000 mL | PREFILLED_SYRINGE | Freq: Once | INTRAMUSCULAR | Status: AC
  Administered 2014-03-09: 0.5 mL via INTRAMUSCULAR
  Filled 2014-03-09: qty 0.5

## 2014-03-09 MED ORDER — SODIUM CHLORIDE 0.9 % IV SOLN
500.0000 mg/m2 | Freq: Once | INTRAVENOUS | Status: AC
  Administered 2014-03-09: 850 mg via INTRAVENOUS
  Filled 2014-03-09: qty 34

## 2014-03-09 MED ORDER — SODIUM CHLORIDE 0.9 % IJ SOLN
10.0000 mL | INTRAMUSCULAR | Status: DC | PRN
  Administered 2014-03-09: 10 mL
  Filled 2014-03-09: qty 10

## 2014-03-09 MED ORDER — HEPARIN SOD (PORK) LOCK FLUSH 100 UNIT/ML IV SOLN
500.0000 [IU] | Freq: Once | INTRAVENOUS | Status: AC | PRN
  Administered 2014-03-09: 500 [IU]
  Filled 2014-03-09: qty 5

## 2014-03-09 MED ORDER — SODIUM CHLORIDE 0.9 % IV SOLN
Freq: Once | INTRAVENOUS | Status: AC
  Administered 2014-03-09: 14:00:00 via INTRAVENOUS

## 2014-03-09 NOTE — Patient Instructions (Signed)
[facility] Discharge Instructions for Patients Receiving Chemotherapy  Today you received the following chemotherapy agent Alimta  To help prevent nausea and vomiting after your treatment, we encourage you to take your nausea medication.   If you develop nausea and vomiting that is not controlled by your nausea medication, call the clinic.   BELOW ARE SYMPTOMS THAT SHOULD BE REPORTED IMMEDIATELY:  *FEVER GREATER THAN 100.5 F  *CHILLS WITH OR WITHOUT FEVER  NAUSEA AND VOMITING THAT IS NOT CONTROLLED WITH YOUR NAUSEA MEDICATION  *UNUSUAL SHORTNESS OF BREATH  *UNUSUAL BRUISING OR BLEEDING  TENDERNESS IN MOUTH AND THROAT WITH OR WITHOUT PRESENCE OF ULCERS  *URINARY PROBLEMS  *BOWEL PROBLEMS  UNUSUAL RASH Items with * indicate a potential emergency and should be followed up as soon as possible.  Feel free to call the clinic you have any questions or concerns. The clinic phone number is [phone].

## 2014-03-09 NOTE — Telephone Encounter (Signed)
gv and printed appt sched and avs for pt for OCT.Marland KitchenMarland Kitchen

## 2014-03-12 NOTE — Progress Notes (Addendum)
[facility] Telephone:(336) [phone]   Fax:(336) [place], [place] [address]  Principle Diagnosis and stage: Recurrent non-small cell lung cancer, initially diagnosed a stage IB (T2, N0, M0) adenocarcinoma in June 2008 with a tumor size of 8 cm.   Prior Therapy:  #1 status post left upper lobectomy with lymph node dissection under the care of Dr. Arlyce Dice on 06/29/2006.  #2 status post 4 cycles of adjuvant chemotherapy with cisplatin and Taxotere last dose given 08/01/2006.  #3 status post 6 cycles of systemic chemotherapy with carboplatin and Alimta for disease recurrence last dose given 12/12/2009.   Current therapy: Maintenance chemotherapy with Alimta at 500 mg per meter squared given every 3 weeks status post 56 cycles.  CHEMOTHERAPY INTENT: Palliative/maintenance.  CURRENT # OF CHEMOTHERAPY CYCLES: 57  CURRENT ANTIEMETICS: Zofran, dexamethasone and Compazine  CURRENT SMOKING STATUS: Never Smoker.   ORAL CHEMOTHERAPY AND CONSENT: None  CURRENT BISPHOSPHONATES USE: None  PAIN MANAGEMENT: None  NARCOTICS INDUCED CONSTIPATION: None  LIVING WILL AND CODE STATUS: has living will for no CODE BLUE.  INTERVAL HISTORY: [name] 80 y.o. female returns to the clinic today for follow up visit. Patient had a reaction to the carboplatin skin test and a carboplatin has been discontinued from her chemotherapy. She continues to receive systemic chemotherapy with single agent Alimta. She presents to proceed with her next cycle. She voices no specific complaints today.  She is tolerating her maintenance chemotherapy with single agent Alimta fairly well. The patient denied having any significant nausea or vomiting. She has no fever or chills. The she denied having any significant chest pain, shortness of breath, or hemoptysis. She has no weight loss or night sweats. She is status post 56 cycles of  her chemotherapy.  MEDICAL HISTORY: Past Medical History  Diagnosis Date  . History of migraine headaches   . Hypercholesterolemia   . lung ca dx'd 06/2006    rt and lt lung  . Lung cancer 3/14/1    right- Adenocarcinoma w/bronchioalveolar features  . FHx: chemotherapy 2008&2011    4 cycles cisplatin,taxotere,2008& carboplatin and Alimta 2011    ALLERGIES:  is allergic to simvastatin and iodinated diagnostic agents.  MEDICATIONS:  Current Outpatient Prescriptions  Medication Sig Dispense Refill  . Calcium Carbonate-Vit D-Min (CALTRATE PLUS PO) Take 500 mg by mouth 2 (two) times daily.        . ferrous fumarate (HEMOCYTE - 106 MG FE) 325 (106 FE) MG TABS tablet Take 1 tablet by mouth daily.      . folic acid (FOLVITE) 1 MG tablet Take 1 mg by mouth daily.        . Multiple Vitamins-Minerals (CENTRUM SILVER PO) Take 1 tablet by mouth daily.       . potassium gluconate 595 MG TABS Take 595 mg by mouth daily.      . vitamin B-12 (CYANOCOBALAMIN) 500 MCG tablet Take 1,500 mcg by mouth daily.       . chlorpheniramine-carbetapentane (TUSSI-12) 4-30 MG/5ML SUSP Take by mouth every 12 (twelve) hours as needed.      Marland Kitchen dexamethasone (DECADRON) 4 MG tablet Take 4 mg by mouth as directed. 1 tablet BID the day before, day of and day after chemo      . prochlorperazine (COMPAZINE) 10 MG tablet Take 1 tablet (10 mg total) by mouth every 6 (six) hours as needed.  15 tablet  1  .  ranitidine (ZANTAC) 75 MG tablet Take 75 mg by mouth. Pt takes as needed       No current facility-administered medications for this visit.    REVIEW OF SYSTEMS:  Constitutional: positive for fatigue Eyes: negative Ears, nose, mouth, throat, and face: negative Respiratory: negative Cardiovascular: negative Gastrointestinal: negative Genitourinary:negative Integument/breast: negative Hematologic/lymphatic: negative Musculoskeletal:negative Neurological: negative Behavioral/Psych: negative Endocrine:  negative Allergic/Immunologic: negative   PHYSICAL EXAMINATION: General appearance: alert, cooperative and no distress Head: Normocephalic, without obvious abnormality, atraumatic Neck: no adenopathy Lymph nodes: Cervical, supraclavicular, and axillary nodes normal. Resp: clear to auscultation bilaterally Back: symmetric, no curvature. ROM normal. No CVA tenderness. Cardio: regular rate and rhythm, S1, S2 normal, no murmur, click, rub or gallop GI: soft, non-tender; bowel sounds normal; no masses,  no organomegaly Extremities: extremities normal, atraumatic, no cyanosis or edema Neurologic: Alert and oriented X 3, normal strength and tone. Normal symmetric reflexes. Normal coordination and gait  ECOG PERFORMANCE STATUS: 0 - Asymptomatic  Blood pressure 142/74, pulse 72, temperature 98.1 F (36.7 C), temperature source Oral, resp. rate 18, height 5\' 2"  (1.575 m), weight 149 lb (67.586 kg), SpO2 100.00%.  LABORATORY DATA: Lab Results  Component Value Date   WBC 3.3* 03/09/2014   HGB 12.6 03/09/2014   HCT 38.2 03/09/2014   MCV 102.1* 03/09/2014   PLT 287 03/09/2014      Chemistry      Component Value Date/Time   NA 142 03/09/2014 1145   NA 139 01/24/2012 1008   NA 140 08/21/2011 1306   K 3.8 03/09/2014 1145   K 4.2 01/24/2012 1008   K 4.1 08/21/2011 1306   CL 108* 11/04/2012 0947   CL 103 01/24/2012 1008   CL 107 08/21/2011 1306   CO2 26 03/09/2014 1145   CO2 28 01/24/2012 1008   CO2 22 08/21/2011 1306   BUN 10.0 03/09/2014 1145   BUN 13 01/24/2012 1008   BUN 11 08/21/2011 1306   CREATININE 0.8 03/09/2014 1145   CREATININE 0.8 01/24/2012 1008   CREATININE 0.81 08/21/2011 1306      Component Value Date/Time   CALCIUM 9.0 03/09/2014 1145   CALCIUM 8.6 01/24/2012 1008   CALCIUM 8.8 08/21/2011 1306   ALKPHOS 83 03/09/2014 1145   ALKPHOS 79 01/24/2012 1008   ALKPHOS 87 08/21/2011 1306   AST 19 03/09/2014 1145   AST 24 01/24/2012 1008   AST 17 08/21/2011 1306   ALT 10 03/09/2014 1145   ALT 21  01/24/2012 1008   ALT 8 08/21/2011 1306   BILITOT 0.49 03/09/2014 1145   BILITOT 0.90 01/24/2012 1008   BILITOT 0.6 08/21/2011 1306       RADIOGRAPHIC STUDIES: Ct Chest Wo Contrast  12/07/2013   CLINICAL DATA:  Lung cancer diagnosed in 2008 with history of left lower lobectomy. Chemotherapy ongoing.  EXAM: CT CHEST WITHOUT CONTRAST  TECHNIQUE: Multidetector CT imaging of the chest was performed following the standard protocol without IV contrast.  COMPARISON:  Chest CTs dated 08/10/2013, 05/11/2013 and 02/15/2013.  FINDINGS: Within the limitations of noncontrast technique, the mediastinum has a stable appearance without evidence of adenopathy. Right IJ Port-A-Cath tip extends to the SVC right atrial junction. There is atherosclerosis of the aorta and coronary arteries. The heart size is normal. There is no pleural or pericardial effusion. A small hiatal hernia is unchanged.  There has been interval enlargement of the ill-defined right upper lobe mass abutting the minor fissure. This measures up to 3.8 x 3.5 cm transverse on image  23 and is associated with air bronchograms inferiorly. There is adjacent airspace disease extending anteriorly along the minor fissure.  11 mm right middle lobe nodular density on image 42 appears stable (most discrete on sagittal image 41). The sub solid subpleural lesion in the left upper lobe appears slightly larger and more dense, measuring 13 mm on image 35. 4 mm subpleural left upper lobe nodule on image 23 is unchanged. There is no endobronchial lesion. The left lower lobe has been previously resected.  The visualized upper abdomen has a stable appearance. There is no adrenal mass.  Thoracic spine Schmorl's nodes are stable. There are no worrisome osseous findings.  IMPRESSION: 1. Progressive enlargement of dominant right upper lobe ill-defined mass consistent with tumor progression. 2. Nodular densities within the right middle lobe and left lung are similar to prior studies,  although the subpleural left upper lobe lesion appears slightly denser. 3. No evidence of adenopathy or pleural effusion. 4. Consider PET-CT for further evaluation of recurrent metabolic activity within the dominant right upper lobe lesion.   Electronically Signed   By: Camie Patience M.D.   On: 12/07/2013 16:22   Nm Pet Image Restag (ps) Skull Base To Thigh  12/24/2013   CLINICAL DATA:  Subsequent treatment strategy for lung carcinoma.  EXAM: NUCLEAR MEDICINE PET SKULL BASE TO THIGH  TECHNIQUE: 7.6 mCi F-18 FDG was injected intravenously. Full-ring PET imaging was performed from the skull base to thigh after the radiotracer. CT data was obtained and used for attenuation correction and anatomic localization.  FASTING BLOOD GLUCOSE:  Value: 85 mg/dl  COMPARISON:  CT 12/07/2013, 10/27/2009, PET-CT 01/27/2009  FINDINGS: NECK  No hypermetabolic lymph nodes in the neck.  CHEST  Within the right upper lobe there is a 2.5 x 3.2 cm hypermetabolic hypermetabolic mass with SUV max equal 6.1. Extending inferiorly along the fissure there is hypermetabolic nodularity with nodules measure up to 14 mm in thickness (image 77) and with moderate metabolic activity SUV max 2.7. More inferiorly along the oblique fissure in the right middle lobe (image 89) there is a hypermetabolic nodule.  Within the peripheral lingula there is a 16 mm nodule (image 79) with low metabolic activity.  No hypermetabolic mediastinal lymph nodes.  ABDOMEN/PELVIS  No abnormal hypermetabolic activity within the liver, pancreas, adrenal glands, or spleen. No hypermetabolic lymph nodes in the abdomen or pelvis. Cyst associated with the right ovary measuring up to 4.8 cm with no associated metabolic activity and therefore is likely benign.  SKELETON  No focal hypermetabolic activity to suggest skeletal metastasis.  IMPRESSION: 1. Hypermetabolic mass in the right upper lobe consistent with lung cancer recurrence. 2. Hypermetabolic nodularity along the oblique  fissure consistent with pleural spread of lung cancer recurrence. 3. Minimally hypermetabolic nodule within the periphery of the lingula is indeterminate but concerning.   Electronically Signed   By: Suzy Bouchard M.D.   On: 12/24/2013 09:24   ASSESSMENT AND PLAN: This is a very pleasant 80 years old white female with recurrent non-small cell lung cancer, adenocarcinoma. The patient is currently undergoing maintenance chemotherapy with single agent Alimta status post 56 cycles and she is tolerating it fairly well. Patient was discussed with and also seen by Dr. Julien Nordmann. She was started back on systemic chemotherapy with carboplatin and Alimta as stated above had a reaction to the carboplatin and scan test with cycle #56 and received Alimta alone. She will proceed with cycle #57 with single agent Alimta today as scheduled. She'll followup in  3 weeks with a restaging CT scan of the chest without contrast to reevaluate her disease.   She was advised to call immediately if she has any concerning symptoms in the interval.  Disclaimer: This note was dictated with voice recognition software. Similar sounding words can inadvertently be transcribed and may not be corrected upon review.  Carlton Adam [name]   ADDENDUM: Hematology/Oncology Attending: I had a face to face encounter with the patient. I recommended her care plan. This is a very pleasant 80 years old white female with recurrent non-small cell lung cancer. She is currently undergoing systemic chemotherapy with single agent Alimta. The patient was tried on carboplatin recently in addition to the but unfortunately had a hypersensitivity reaction and this was discontinued. I recommended for her to proceed with the current cycle of Alimta as scheduled. She would come back for followup visit in 3 weeks with the next cycle of her treatment after repeating CT scan of the chest for evaluation of her disease. If she continues to have evidence for  disease progression, I may consider her for treatment with immunotherapy or other single agent chemotherapy. She was advised to call immediately if she has any concerning symptoms in the interval.  Disclaimer: This note was dictated with voice recognition software. Similar sounding words can inadvertently be transcribed and may be missed upon review. [name]., MD 03/13/2014

## 2014-03-12 NOTE — Patient Instructions (Signed)
Follow up in 3 weeks with a restaging CT scan of your chest to re-evaluate your disease

## 2014-03-25 ENCOUNTER — Ambulatory Visit (HOSPITAL_COMMUNITY)
Admission: RE | Admit: 2014-03-25 | Discharge: 2014-03-25 | Disposition: A | Discharge status: Home / Self Care | Payer: Medicare Other | Source: Ambulatory Visit | Attending: Physician Assistant | Admitting: Physician Assistant

## 2014-03-25 DIAGNOSIS — C3491 Malignant neoplasm of unspecified part of right bronchus or lung: Secondary | ICD-10-CM

## 2014-03-25 DIAGNOSIS — C799 Secondary malignant neoplasm of unspecified site: Secondary | ICD-10-CM | POA: Insufficient documentation

## 2014-03-25 DIAGNOSIS — C3411 Malignant neoplasm of upper lobe, right bronchus or lung: Secondary | ICD-10-CM | POA: Diagnosis present

## 2014-03-25 DIAGNOSIS — I700 Atherosclerosis of aorta: Secondary | ICD-10-CM | POA: Diagnosis not present

## 2014-03-30 ENCOUNTER — Ambulatory Visit (HOSPITAL_BASED_OUTPATIENT_CLINIC_OR_DEPARTMENT_OTHER): Payer: Medicare Other | Admitting: Physician Assistant

## 2014-03-30 ENCOUNTER — Ambulatory Visit (HOSPITAL_BASED_OUTPATIENT_CLINIC_OR_DEPARTMENT_OTHER): Payer: Medicare Other

## 2014-03-30 ENCOUNTER — Encounter: Payer: Self-pay | Admitting: Physician Assistant

## 2014-03-30 ENCOUNTER — Ambulatory Visit: Payer: Medicare Other

## 2014-03-30 ENCOUNTER — Telehealth: Payer: Self-pay | Admitting: Internal Medicine

## 2014-03-30 ENCOUNTER — Other Ambulatory Visit (HOSPITAL_BASED_OUTPATIENT_CLINIC_OR_DEPARTMENT_OTHER): Payer: Medicare Other

## 2014-03-30 ENCOUNTER — Telehealth: Payer: Self-pay | Admitting: *Deleted

## 2014-03-30 VITALS — BP 144/67 | HR 64 | Temp 97.9°F | Resp 18 | Ht 62.0 in | Wt 149.7 lb

## 2014-03-30 DIAGNOSIS — Z5111 Encounter for antineoplastic chemotherapy: Secondary | ICD-10-CM

## 2014-03-30 DIAGNOSIS — C3432 Malignant neoplasm of lower lobe, left bronchus or lung: Secondary | ICD-10-CM

## 2014-03-30 DIAGNOSIS — C3490 Malignant neoplasm of unspecified part of unspecified bronchus or lung: Secondary | ICD-10-CM

## 2014-03-30 DIAGNOSIS — C3411 Malignant neoplasm of upper lobe, right bronchus or lung: Secondary | ICD-10-CM

## 2014-03-30 DIAGNOSIS — C3431 Malignant neoplasm of lower lobe, right bronchus or lung: Secondary | ICD-10-CM

## 2014-03-30 LAB — CBC WITH DIFFERENTIAL/PLATELET
BASO%: 0.7 % (ref 0.0–2.0)
Basophils Absolute: 0 10*3/uL (ref 0.0–0.1)
EOS%: 1 % (ref 0.0–7.0)
Eosinophils Absolute: 0 10*3/uL (ref 0.0–0.5)
HCT: 38.9 % (ref 34.8–46.6)
HGB: 12.6 g/dL (ref 11.6–15.9)
LYMPH#: 1.4 10*3/uL (ref 0.9–3.3)
LYMPH%: 44.2 % (ref 14.0–49.7)
MCH: 33.5 pg (ref 25.1–34.0)
MCHC: 32.5 g/dL (ref 31.5–36.0)
MCV: 103.1 fL — ABNORMAL HIGH (ref 79.5–101.0)
MONO#: 0.4 10*3/uL (ref 0.1–0.9)
MONO%: 12.3 % (ref 0.0–14.0)
NEUT#: 1.3 10*3/uL — ABNORMAL LOW (ref 1.5–6.5)
NEUT%: 41.8 % (ref 38.4–76.8)
Platelets: 233 10*3/uL (ref 145–400)
RBC: 3.78 10*6/uL (ref 3.70–5.45)
RDW: 14.9 % — AB (ref 11.2–14.5)
WBC: 3.1 10*3/uL — ABNORMAL LOW (ref 3.9–10.3)

## 2014-03-30 LAB — COMPREHENSIVE METABOLIC PANEL (CC13)
ALBUMIN: 3.4 g/dL — AB (ref 3.5–5.0)
ALT: 13 U/L (ref 0–55)
ANION GAP: 10 meq/L (ref 3–11)
AST: 20 U/L (ref 5–34)
Alkaline Phosphatase: 80 U/L (ref 40–150)
BUN: 14 mg/dL (ref 7.0–26.0)
CALCIUM: 8.9 mg/dL (ref 8.4–10.4)
CHLORIDE: 110 meq/L — AB (ref 98–109)
CO2: 22 meq/L (ref 22–29)
Creatinine: 0.8 mg/dL (ref 0.6–1.1)
Glucose: 89 mg/dl (ref 70–140)
POTASSIUM: 3.5 meq/L (ref 3.5–5.1)
SODIUM: 142 meq/L (ref 136–145)
TOTAL PROTEIN: 6.4 g/dL (ref 6.4–8.3)
Total Bilirubin: 0.63 mg/dL (ref 0.20–1.20)

## 2014-03-30 MED ORDER — ONDANSETRON 16 MG/50ML IVPB (CHCC)
INTRAVENOUS | Status: AC
  Filled 2014-03-30: qty 16

## 2014-03-30 MED ORDER — DEXAMETHASONE SODIUM PHOSPHATE 20 MG/5ML IJ SOLN
INTRAMUSCULAR | Status: AC
  Filled 2014-03-30: qty 5

## 2014-03-30 MED ORDER — HEPARIN SOD (PORK) LOCK FLUSH 100 UNIT/ML IV SOLN
500.0000 [IU] | Freq: Once | INTRAVENOUS | Status: AC | PRN
  Administered 2014-03-30: 500 [IU]
  Filled 2014-03-30: qty 5

## 2014-03-30 MED ORDER — SODIUM CHLORIDE 0.9 % IJ SOLN
10.0000 mL | INTRAMUSCULAR | Status: DC | PRN
  Administered 2014-03-30: 10 mL
  Filled 2014-03-30: qty 10

## 2014-03-30 MED ORDER — CYANOCOBALAMIN 1000 MCG/ML IJ SOLN
INTRAMUSCULAR | Status: AC
  Filled 2014-03-30: qty 1

## 2014-03-30 MED ORDER — ONDANSETRON 16 MG/50ML IVPB (CHCC)
16.0000 mg | Freq: Once | INTRAVENOUS | Status: AC
  Administered 2014-03-30: 16 mg via INTRAVENOUS

## 2014-03-30 MED ORDER — DEXAMETHASONE SODIUM PHOSPHATE 20 MG/5ML IJ SOLN
20.0000 mg | Freq: Once | INTRAMUSCULAR | Status: AC
  Administered 2014-03-30: 20 mg via INTRAVENOUS

## 2014-03-30 MED ORDER — CYANOCOBALAMIN 1000 MCG/ML IJ SOLN
1000.0000 ug | Freq: Once | INTRAMUSCULAR | Status: AC
  Administered 2014-03-30: 1000 ug via INTRAMUSCULAR

## 2014-03-30 MED ORDER — SODIUM CHLORIDE 0.9 % IV SOLN
Freq: Once | INTRAVENOUS | Status: AC
  Administered 2014-03-30: 10:00:00 via INTRAVENOUS

## 2014-03-30 MED ORDER — SODIUM CHLORIDE 0.9 % IV SOLN
500.0000 mg/m2 | Freq: Once | INTRAVENOUS | Status: AC
  Administered 2014-03-30: 850 mg via INTRAVENOUS
  Filled 2014-03-30: qty 34

## 2014-03-30 NOTE — Telephone Encounter (Signed)
Per staff message and POF I have scheduled appts. Advised scheduler of appts. JMW  

## 2014-03-30 NOTE — Telephone Encounter (Signed)
Pt confirmed labs/ov per 10/28 POF, sent msg to add chemo, gave AVS..... KJ

## 2014-03-30 NOTE — Progress Notes (Signed)
Ok to treat with ANC 1.3 per [facility].

## 2014-03-30 NOTE — Progress Notes (Addendum)
[name] Telephone:(336) [phone]   Fax:(336) [place], [place] [address]  Principle Diagnosis and stage: Recurrent non-small cell lung cancer, initially diagnosed a stage IB (T2, N0, M0) adenocarcinoma in June 2008 with a tumor size of 8 cm.   Prior Therapy:  #1 status post left upper lobectomy with lymph node dissection under the care of Dr. Arlyce Dice on 06/29/2006.  #2 status post 4 cycles of adjuvant chemotherapy with cisplatin and Taxotere last dose given 08/01/2006.  #3 status post 6 cycles of systemic chemotherapy with carboplatin and Alimta for disease recurrence last dose given 12/12/2009.   Current therapy: Maintenance chemotherapy with Alimta at 500 mg per meter squared given every 3 weeks status post 57 cycles.  CHEMOTHERAPY INTENT: Palliative/maintenance.  CURRENT # OF CHEMOTHERAPY CYCLES: 58  CURRENT ANTIEMETICS: Zofran, dexamethasone and Compazine  CURRENT SMOKING STATUS: Never Smoker.   ORAL CHEMOTHERAPY AND CONSENT: None  CURRENT BISPHOSPHONATES USE: None  PAIN MANAGEMENT: None  NARCOTICS INDUCED CONSTIPATION: None  LIVING WILL AND CODE STATUS: has living will for no CODE BLUE.  INTERVAL HISTORY: [name] 80 y.o. female returns to the clinic today for follow up visit. She is accompanied today by her daughter [name]. Patient had a reaction to the carboplatin skin test and a carboplatin has been discontinued from her chemotherapy. She continues to receive systemic chemotherapy with single agent Alimta. She presents to proceed with her next cycle. She voices no specific complaints today except for some brown spots on her forearms related to chemotherapy. She still has a dark spot on her left forearm from her reaction from the carboplatin test dose..  She is tolerating her maintenance chemotherapy with single agent Alimta fairly well. The patient denied having any  significant nausea or vomiting. She has no fever or chills. The she denied having any significant chest pain, shortness of breath, or hemoptysis. She has no weight loss or night sweats. She is status post 57 cycles of her chemotherapy. She recently had a restaging CT scan of the chest to reevaluate her disease and presents to discuss the results. The CT scan was performed on 03/25/2014.   MEDICAL HISTORY: Past Medical History  Diagnosis Date  . History of migraine headaches   . Hypercholesterolemia   . lung ca dx'd 06/2006    rt and lt lung  . Lung cancer 3/14/1    right- Adenocarcinoma w/bronchioalveolar features  . FHx: chemotherapy 2008&2011    4 cycles cisplatin,taxotere,2008& carboplatin and Alimta 2011    ALLERGIES:  is allergic to simvastatin and iodinated diagnostic agents.  MEDICATIONS:  Current Outpatient Prescriptions  Medication Sig Dispense Refill  . Calcium Carbonate-Vit D-Min (CALTRATE PLUS PO) Take 500 mg by mouth 2 (two) times daily.        . folic acid (FOLVITE) 1 MG tablet Take 1 mg by mouth daily.        . Multiple Vitamins-Minerals (CENTRUM SILVER PO) Take 1 tablet by mouth daily.       . vitamin B-12 (CYANOCOBALAMIN) 500 MCG tablet Take 1,500 mcg by mouth daily.       . chlorpheniramine-carbetapentane (TUSSI-12) 4-30 MG/5ML SUSP Take by mouth every 12 (twelve) hours as needed.      Marland Kitchen dexamethasone (DECADRON) 4 MG tablet Take 4 mg by mouth as directed. 1 tablet BID the day before, day of and day after chemo      . ferrous  fumarate (HEMOCYTE - 106 MG FE) 325 (106 FE) MG TABS tablet Take 1 tablet by mouth daily.      . potassium gluconate 595 MG TABS Take 595 mg by mouth daily.      . prochlorperazine (COMPAZINE) 10 MG tablet Take 1 tablet (10 mg total) by mouth every 6 (six) hours as needed.  15 tablet  1  . ranitidine (ZANTAC) 75 MG tablet Take 75 mg by mouth. Pt takes as needed       No current facility-administered medications for this visit.    REVIEW OF  SYSTEMS:  Constitutional: positive for fatigue Eyes: negative Ears, nose, mouth, throat, and face: negative Respiratory: negative Cardiovascular: negative Gastrointestinal: negative Genitourinary:negative Integument/breast: positive for skin color change and Brown spots on both forearms and hands related to chemotherapy Hematologic/lymphatic: negative Musculoskeletal:negative Neurological: negative Behavioral/Psych: negative Endocrine: negative Allergic/Immunologic: negative   PHYSICAL EXAMINATION: General appearance: alert, cooperative and no distress Head: Normocephalic, without obvious abnormality, atraumatic Neck: no adenopathy Lymph nodes: Cervical, supraclavicular, and axillary nodes normal. Resp: clear to auscultation bilaterally Back: symmetric, no curvature. ROM normal. No CVA tenderness. Cardio: regular rate and rhythm, S1, S2 normal, no murmur, click, rub or gallop GI: soft, non-tender; bowel sounds normal; no masses,  no organomegaly Extremities: extremities normal, atraumatic, no cyanosis or edema Neurologic: Alert and oriented X 3, normal strength and tone. Normal symmetric reflexes. Normal coordination and gait  ECOG PERFORMANCE STATUS: 0 - Asymptomatic  Blood pressure 144/67, pulse 64, temperature 97.9 F (36.6 C), temperature source Oral, resp. rate 18, height 5\' 2"  (1.575 m), weight 149 lb 11.2 oz (67.903 kg), SpO2 100.00%.  LABORATORY DATA: Lab Results  Component Value Date   WBC 3.1* 03/30/2014   HGB 12.6 03/30/2014   HCT 38.9 03/30/2014   MCV 103.1* 03/30/2014   PLT 233 03/30/2014      Chemistry      Component Value Date/Time   NA 142 03/30/2014 0814   NA 139 01/24/2012 1008   NA 140 08/21/2011 1306   K 3.5 03/30/2014 0814   K 4.2 01/24/2012 1008   K 4.1 08/21/2011 1306   CL 108* 11/04/2012 0947   CL 103 01/24/2012 1008   CL 107 08/21/2011 1306   CO2 22 03/30/2014 0814   CO2 28 01/24/2012 1008   CO2 22 08/21/2011 1306   BUN 14.0 03/30/2014 0814    BUN 13 01/24/2012 1008   BUN 11 08/21/2011 1306   CREATININE 0.8 03/30/2014 0814   CREATININE 0.8 01/24/2012 1008   CREATININE 0.81 08/21/2011 1306      Component Value Date/Time   CALCIUM 8.9 03/30/2014 0814   CALCIUM 8.6 01/24/2012 1008   CALCIUM 8.8 08/21/2011 1306   ALKPHOS 80 03/30/2014 0814   ALKPHOS 79 01/24/2012 1008   ALKPHOS 87 08/21/2011 1306   AST 20 03/30/2014 0814   AST 24 01/24/2012 1008   AST 17 08/21/2011 1306   ALT 13 03/30/2014 0814   ALT 21 01/24/2012 1008   ALT 8 08/21/2011 1306   BILITOT 0.63 03/30/2014 0814   BILITOT 0.90 01/24/2012 1008   BILITOT 0.6 08/21/2011 1306       RADIOGRAPHIC STUDIES: Ct Chest Wo Contrast  03/30/2014   ADDENDUM REPORT: 03/30/2014 13:35  ADDENDUM: IMPRESSION: 1. Mass in the right upper lobe demonstrates mild decrease in size from previous exam. 2. Stable appearance of nodularity along the oblique fissure of the right lung. 3. Sub solid nodule within the left upper lobe is unchanged. 4. Atherosclerotic disease.  Electronically Signed   By: Kerby Moors M.D.   On: 03/30/2014 13:35   03/30/2014   CLINICAL DATA:  Restaging metastatic non-small cell lung cancer.  EXAM: CT CHEST WITHOUT CONTRAST  TECHNIQUE: Multidetector CT imaging of the chest was performed following the standard protocol without IV contrast.  COMPARISON:  CT chest 12/07/2013.  FINDINGS: Mediastinum: Normal heart size. No pericardial effusion. Calcified atherosclerotic plaque involves the thoracic aorta as well as the LAD coronary artery. Trachea is patent and midline. Unremarkable appearance of the esophagus. There are no enlarged mediastinal or hilar lymph nodes. No axillary or supraclavicular adenopathy identified.  Lungs/Pleura: Mass in the right upper lobe measures 3.3 x 3.0 cm, image 23/series 5. Previously 3.8 x 3.5 cm. Right middle lobe sub solid lesion measures 1.3 cm, image 40/series 5. Nodularity along the oblique fissure is again identified, image 46/ series 603. Index  nodule measures 1 cm. This is unchanged from previous exam. Sub solid lesion within the anterior left upper lobe measures 1.1 cm, image 33/series 5. This is compared with 1.3 cm previously. Small solid nodule within the left upper lobe is unchanged measuring 4 mm, image 21/series 5.  Upper Abdomen: Incidental imaging through the upper abdomen is unremarkable. The adrenal glands are visualized and appear normal.  Musculoskeletal: Review of the visualized osseous structures is significant for mild degenerative disc disease. No aggressive lytic or sclerotic bone lesions identified.  IMPRESSION: 1. Mass in the right upper lobe demonstrates mild increased in size from previous exam. 2. Stable appearance of nodularity along the oblique fissure of the right lung. 3. Sub solid nodule within the left upper lobe is unchanged. 4. Atherosclerotic disease.  Electronically Signed: By: Kerby Moors M.D. On: 03/25/2014 17:52    ASSESSMENT AND PLAN: This is a very pleasant 80 years old white female with recurrent non-small cell lung cancer, adenocarcinoma. The patient is currently undergoing maintenance chemotherapy with single agent Alimta status post 57 cycles and she is tolerating it fairly well. Patient was discussed with and also seen by Dr. Julien Nordmann. Her recent CT scan by measurements revealed slight decrease/improvement however the impression was read as slight increase in size from previous exam. I personally spoke with Dr. Ilda Foil in the radiology department as Dr. Clovis Riley, who read the CT  Study, was not available. Dr. Ilda Foil agreed that the actual measurements indicated mild decrease/improvement in disease. He stated that he would discuss these findings with Dr. Clovis Riley and have Dr. Jerrye Noble his impression. These results were discussed with the patient and her daughter. She will continue on her treatment with current single agent Alimta. He She was started back on systemic chemotherapy with carboplatin and Alimta  as stated above had a reaction to the carboplatin and skin test with cycle #56 and received Alimta alone. She will proceed with cycle #58 with single agent Alimta today as scheduled. She'll followup in 3 weeks prior to cycle #59.   She was advised to call immediately if she has any concerning symptoms in the interval.   Disclaimer: This note was dictated with voice recognition software. Similar sounding words can inadvertently be transcribed and may be missed upon review. Carlton Adam, [name] 03/30/2014   ADDENDUM: Hematology/Oncology Attending: I had a face to face encounter with the patient. I recommended her care plan. This is a very pleasant 80 years old white female with recurrent non-small cell lung cancer, adenocarcinoma currently on treatment with maintenance Tarceva status post 56 cycles and tolerating her treatment fairly well  with no significant adverse effects. The recent CT scan of the chest showed stable disease with no significant evidence for disease progression.  I discussed the scan results wit the patient and her daughter. I recommended for her to continue her current treatment with single agent Alimta as scheduled. She would proceed with cycle #58 today. The patient would come back for followup visit in 3 weeks with the next cycle of her treatment. She was advised to call immediately if she has any concerning symptoms in the interval.  Disclaimer: This note was dictated with voice recognition software. Similar sounding words can inadvertently be transcribed and may be missed upon review. [name]., MD 04/03/2014

## 2014-03-30 NOTE — Patient Instructions (Signed)
[facility] Discharge Instructions for Patients Receiving Chemotherapy  Today you received the following chemotherapy agents:  alimta  To help prevent nausea and vomiting after your treatment, we encourage you to take your nausea medication.  Take it as often as prescribed.     If you develop nausea and vomiting that is not controlled by your nausea medication, call the clinic. If it is after clinic hours your family physician or the after hours number for the clinic or go to the Emergency Department.   BELOW ARE SYMPTOMS THAT SHOULD BE REPORTED IMMEDIATELY:  *FEVER GREATER THAN 100.5 F  *CHILLS WITH OR WITHOUT FEVER  NAUSEA AND VOMITING THAT IS NOT CONTROLLED WITH YOUR NAUSEA MEDICATION  *UNUSUAL SHORTNESS OF BREATH  *UNUSUAL BRUISING OR BLEEDING  TENDERNESS IN MOUTH AND THROAT WITH OR WITHOUT PRESENCE OF ULCERS  *URINARY PROBLEMS  *BOWEL PROBLEMS  UNUSUAL RASH Items with * indicate a potential emergency and should be followed up as soon as possible.  Feel free to call the clinic you have any questions or concerns. The clinic phone number is [phone].   I have been informed and understand all the instructions given to me. I know to contact the clinic, my physician, or go to the Emergency Department if any problems should occur. I do not have any questions at this time, but understand that I may call the clinic during office hours   should I have any questions or need assistance in obtaining follow up care.    __________________________________________  _____________  __________ Signature of Patient or Authorized Representative            Date                   Time    __________________________________________ Nurse's Signature

## 2014-04-02 NOTE — Patient Instructions (Signed)
Your restaging CT scan revealed slight improvement in your disease. You will continue on your current chemotherapy Follow-up in 3 weeks

## 2014-04-20 ENCOUNTER — Ambulatory Visit (HOSPITAL_BASED_OUTPATIENT_CLINIC_OR_DEPARTMENT_OTHER): Payer: Medicare Other | Admitting: Internal Medicine

## 2014-04-20 ENCOUNTER — Ambulatory Visit (HOSPITAL_BASED_OUTPATIENT_CLINIC_OR_DEPARTMENT_OTHER): Payer: Medicare Other

## 2014-04-20 ENCOUNTER — Telehealth: Payer: Self-pay | Admitting: Internal Medicine

## 2014-04-20 ENCOUNTER — Other Ambulatory Visit (HOSPITAL_BASED_OUTPATIENT_CLINIC_OR_DEPARTMENT_OTHER): Payer: Medicare Other

## 2014-04-20 ENCOUNTER — Encounter: Payer: Self-pay | Admitting: Internal Medicine

## 2014-04-20 VITALS — BP 120/77 | HR 72 | Temp 98.2°F | Resp 18 | Ht 62.0 in | Wt 150.5 lb

## 2014-04-20 DIAGNOSIS — C3411 Malignant neoplasm of upper lobe, right bronchus or lung: Secondary | ICD-10-CM

## 2014-04-20 DIAGNOSIS — Z5111 Encounter for antineoplastic chemotherapy: Secondary | ICD-10-CM

## 2014-04-20 DIAGNOSIS — C3490 Malignant neoplasm of unspecified part of unspecified bronchus or lung: Secondary | ICD-10-CM

## 2014-04-20 LAB — CBC WITH DIFFERENTIAL/PLATELET
BASO%: 0.3 % (ref 0.0–2.0)
Basophils Absolute: 0 10*3/uL (ref 0.0–0.1)
EOS ABS: 0 10*3/uL (ref 0.0–0.5)
EOS%: 0.7 % (ref 0.0–7.0)
HCT: 38.5 % (ref 34.8–46.6)
HEMOGLOBIN: 12.7 g/dL (ref 11.6–15.9)
LYMPH#: 1.4 10*3/uL (ref 0.9–3.3)
LYMPH%: 46.5 % (ref 14.0–49.7)
MCH: 33.1 pg (ref 25.1–34.0)
MCHC: 33 g/dL (ref 31.5–36.0)
MCV: 100.3 fL (ref 79.5–101.0)
MONO#: 0.4 10*3/uL (ref 0.1–0.9)
MONO%: 13 % (ref 0.0–14.0)
NEUT%: 39.5 % (ref 38.4–76.8)
NEUTROS ABS: 1.2 10*3/uL — AB (ref 1.5–6.5)
Platelets: 254 10*3/uL (ref 145–400)
RBC: 3.84 10*6/uL (ref 3.70–5.45)
RDW: 14.2 % (ref 11.2–14.5)
WBC: 3 10*3/uL — AB (ref 3.9–10.3)

## 2014-04-20 LAB — COMPREHENSIVE METABOLIC PANEL (CC13)
ALK PHOS: 86 U/L (ref 40–150)
ALT: 10 U/L (ref 0–55)
AST: 21 U/L (ref 5–34)
Albumin: 3.4 g/dL — ABNORMAL LOW (ref 3.5–5.0)
Anion Gap: 9 mEq/L (ref 3–11)
BUN: 12.5 mg/dL (ref 7.0–26.0)
CALCIUM: 9 mg/dL (ref 8.4–10.4)
CHLORIDE: 107 meq/L (ref 98–109)
CO2: 25 mEq/L (ref 22–29)
Creatinine: 0.8 mg/dL (ref 0.6–1.1)
Glucose: 83 mg/dl (ref 70–140)
Potassium: 3.8 mEq/L (ref 3.5–5.1)
SODIUM: 141 meq/L (ref 136–145)
TOTAL PROTEIN: 6.5 g/dL (ref 6.4–8.3)
Total Bilirubin: 0.58 mg/dL (ref 0.20–1.20)

## 2014-04-20 MED ORDER — ONDANSETRON 16 MG/50ML IVPB (CHCC)
16.0000 mg | Freq: Once | INTRAVENOUS | Status: AC
  Administered 2014-04-20: 16 mg via INTRAVENOUS

## 2014-04-20 MED ORDER — SODIUM CHLORIDE 0.9 % IJ SOLN
10.0000 mL | INTRAMUSCULAR | Status: DC | PRN
  Administered 2014-04-20: 10 mL
  Filled 2014-04-20: qty 10

## 2014-04-20 MED ORDER — SODIUM CHLORIDE 0.9 % IV SOLN
500.0000 mg/m2 | Freq: Once | INTRAVENOUS | Status: AC
  Administered 2014-04-20: 850 mg via INTRAVENOUS
  Filled 2014-04-20: qty 34

## 2014-04-20 MED ORDER — ONDANSETRON 16 MG/50ML IVPB (CHCC)
INTRAVENOUS | Status: AC
  Filled 2014-04-20: qty 16

## 2014-04-20 MED ORDER — SODIUM CHLORIDE 0.9 % IV SOLN
Freq: Once | INTRAVENOUS | Status: AC
  Administered 2014-04-20: 12:00:00 via INTRAVENOUS

## 2014-04-20 MED ORDER — HEPARIN SOD (PORK) LOCK FLUSH 100 UNIT/ML IV SOLN
500.0000 [IU] | Freq: Once | INTRAVENOUS | Status: AC | PRN
  Administered 2014-04-20: 500 [IU]
  Filled 2014-04-20: qty 5

## 2014-04-20 MED ORDER — DEXAMETHASONE SODIUM PHOSPHATE 20 MG/5ML IJ SOLN
INTRAMUSCULAR | Status: AC
  Filled 2014-04-20: qty 5

## 2014-04-20 MED ORDER — DEXAMETHASONE SODIUM PHOSPHATE 20 MG/5ML IJ SOLN
20.0000 mg | Freq: Once | INTRAMUSCULAR | Status: AC
  Administered 2014-04-20: 20 mg via INTRAVENOUS

## 2014-04-20 NOTE — Telephone Encounter (Signed)
Pt confirmed labs/ov per 11/18 POF, gave pt AVS..... KJ, sent msg to add/move chemo...Marland KitchenMarland Kitchen

## 2014-04-20 NOTE — Patient Instructions (Signed)
[facility] Discharge Instructions for Patients Receiving Chemotherapy  Today you received the following chemotherapy agents; Alimta.   To help prevent nausea and vomiting after your treatment, we encourage you to take your nausea medication as directed.    If you develop nausea and vomiting that is not controlled by your nausea medication, call the clinic.   BELOW ARE SYMPTOMS THAT SHOULD BE REPORTED IMMEDIATELY:  *FEVER GREATER THAN 100.5 F  *CHILLS WITH OR WITHOUT FEVER  NAUSEA AND VOMITING THAT IS NOT CONTROLLED WITH YOUR NAUSEA MEDICATION  *UNUSUAL SHORTNESS OF BREATH  *UNUSUAL BRUISING OR BLEEDING  TENDERNESS IN MOUTH AND THROAT WITH OR WITHOUT PRESENCE OF ULCERS  *URINARY PROBLEMS  *BOWEL PROBLEMS  UNUSUAL RASH Items with * indicate a potential emergency and should be followed up as soon as possible.  Feel free to call the clinic you have any questions or concerns. The clinic phone number is [phone].

## 2014-04-20 NOTE — Progress Notes (Signed)
Ok to Treat with ANC 1.2 per Dr. Julien Nordmann

## 2014-04-20 NOTE — Progress Notes (Signed)
[facility] [phone]   [phone]  VISIT PROGRESS NOTE  [name],[name], MD [place] [address]  Principle Diagnosis and stage: Recurrent non-small cell lung cancer, initially diagnosed a stage IB (T2, N0, M0) adenocarcinoma in June 2008 with a tumor size of 8 cm.   Prior Therapy:  #1 status post left upper lobectomy with lymph node dissection under the care of Dr. Arlyce Dice on 06/29/2006.  #2 status post 4 cycles of adjuvant chemotherapy with cisplatin and Taxotere last dose given 08/01/2006.  #3 status post 6 cycles of systemic chemotherapy with carboplatin and Alimta for disease recurrence last dose given 12/12/2009.   Current therapy: Maintenance chemotherapy with Alimta at 500 mg per meter squared given every 3 weeks status post 58 cycles.  CHEMOTHERAPY INTENT: Palliative/maintenance.  CURRENT # OF CHEMOTHERAPY CYCLES: 59  CURRENT ANTIEMETICS: Zofran, dexamethasone and Compazine  CURRENT SMOKING STATUS: Never Smoker.   ORAL CHEMOTHERAPY AND CONSENT: None  CURRENT BISPHOSPHONATES USE: None  PAIN MANAGEMENT: None  NARCOTICS INDUCED CONSTIPATION: None  LIVING WILL AND CODE STATUS: has living will for no CODE BLUE.  INTERVAL HISTORY: [name] [name] 80 y.o. [name] returns to the clinic today for follow up visit. The patient is feeling fine today with no specific complaints. She is tolerating her maintenance chemotherapy with single agent Alimta fairly well. The patient denied having any significant nausea or vomiting. She has no fever or chills. The she denied having any significant chest pain, shortness of breath, or hemoptysis. She has no weight loss or night sweats. She is here today to start cycle #59.  MEDICAL HISTORY: Past Medical History  Diagnosis Date  . History of migraine headaches   . Hypercholesterolemia   . lung ca dx'd 06/2006    rt and lt lung  . Lung cancer 3/14/1    right- Adenocarcinoma  w/bronchioalveolar features  . FHx: chemotherapy 2008&2011    4 cycles cisplatin,taxotere,2008& carboplatin and Alimta 2011    ALLERGIES:  is allergic to simvastatin and iodinated diagnostic agents.  MEDICATIONS:  Current Outpatient Prescriptions  Medication Sig Dispense Refill  . Calcium Carbonate-Vit D-Min (CALTRATE PLUS PO) Take 500 mg by mouth 2 (two) times daily.      . chlorpheniramine-carbetapentane (TUSSI-12) 4-30 MG/5ML SUSP Take by mouth every 12 (twelve) hours as needed.    Marland Kitchen dexamethasone (DECADRON) 4 MG tablet Take 4 mg by mouth as directed. 1 tablet BID the day before, day of and day after chemo    . ferrous fumarate (HEMOCYTE - 106 MG FE) 325 (106 FE) MG TABS tablet Take 1 tablet by mouth daily.    . folic acid (FOLVITE) 1 MG tablet Take 1 mg by mouth daily.      . Multiple Vitamins-Minerals (CENTRUM SILVER PO) Take 1 tablet by mouth daily.     . potassium gluconate 595 MG TABS Take 595 mg by mouth daily.    . ranitidine (ZANTAC) 75 MG tablet Take 75 mg by mouth. Pt takes as needed    . vitamin B-12 (CYANOCOBALAMIN) 500 MCG tablet Take 1,500 mcg by mouth daily.     . prochlorperazine (COMPAZINE) 10 MG tablet Take 1 tablet (10 mg total) by mouth every 6 (six) hours as needed. 15 tablet 1   No current facility-administered medications for this visit.    REVIEW OF SYSTEMS:  Constitutional: positive for fatigue Eyes: negative Ears, nose, mouth, throat, and face: negative Respiratory: negative Cardiovascular: negative Gastrointestinal: negative  Genitourinary:negative Integument/breast: negative Hematologic/lymphatic: negative Musculoskeletal:negative Neurological: negative Behavioral/Psych: negative Endocrine: negative Allergic/Immunologic: negative   PHYSICAL EXAMINATION: General appearance: alert, cooperative and no distress Head: Normocephalic, without obvious abnormality, atraumatic Neck: no adenopathy Lymph nodes: Cervical, supraclavicular, and axillary nodes  normal. Resp: clear to auscultation bilaterally Back: symmetric, no curvature. ROM normal. No CVA tenderness. Cardio: regular rate and rhythm, S1, S2 normal, no murmur, click, rub or gallop GI: soft, non-tender; bowel sounds normal; no masses,  no organomegaly Extremities: extremities normal, atraumatic, no cyanosis or edema Neurologic: Alert and oriented X 3, normal strength and tone. Normal symmetric reflexes. Normal coordination and gait  ECOG PERFORMANCE STATUS: 0 - Asymptomatic  Blood pressure 120/77, pulse 72, temperature 98.2 F (36.8 C), temperature source Oral, resp. rate 18, height 5\' 2"  (1.575 m), weight 150 lb 8 oz (68.266 kg).  LABORATORY DATA: Lab Results  Component Value Date   WBC 3.0* 04/20/2014   HGB 12.7 04/20/2014   HCT 38.5 04/20/2014   MCV 100.3 04/20/2014   PLT 254 04/20/2014      Chemistry      Component Value Date/Time   NA 141 04/20/2014 1033   NA 139 01/24/2012 1008   NA 140 08/21/2011 1306   K 3.8 04/20/2014 1033   K 4.2 01/24/2012 1008   K 4.1 08/21/2011 1306   CL 108* 11/04/2012 0947   CL 103 01/24/2012 1008   CL 107 08/21/2011 1306   CO2 25 04/20/2014 1033   CO2 28 01/24/2012 1008   CO2 22 08/21/2011 1306   BUN 12.5 04/20/2014 1033   BUN 13 01/24/2012 1008   BUN 11 08/21/2011 1306   CREATININE 0.8 04/20/2014 1033   CREATININE 0.8 01/24/2012 1008   CREATININE 0.81 08/21/2011 1306      Component Value Date/Time   CALCIUM 9.0 04/20/2014 1033   CALCIUM 8.6 01/24/2012 1008   CALCIUM 8.8 08/21/2011 1306   ALKPHOS 86 04/20/2014 1033   ALKPHOS 79 01/24/2012 1008   ALKPHOS 87 08/21/2011 1306   AST 21 04/20/2014 1033   AST 24 01/24/2012 1008   AST 17 08/21/2011 1306   ALT 10 04/20/2014 1033   ALT 21 01/24/2012 1008   ALT 8 08/21/2011 1306   BILITOT 0.58 04/20/2014 1033   BILITOT 0.90 01/24/2012 1008   BILITOT 0.6 08/21/2011 1306       RADIOGRAPHIC STUDIES:  ASSESSMENT AND PLAN: This is a very pleasant 80 years old white [name]  with recurrent non-small cell lung cancer, adenocarcinoma. The patient is currently undergoing maintenance chemotherapy with single agent Alimta status post 58 cycles and she is tolerating it fairly well. Her last CT scan of the chest showed no evidence for disease progression. I recommended for the patient to proceed with her maintenance chemotherapy with single agent Alimta as scheduled. We'll proceed today with cycle #59 as scheduled.  She would come back for followup visit in 3 weeks with the next cycle of her chemotherapy. She was advised to call immediately if she has any concerning symptoms in the interval.  Disclaimer: This note was dictated with voice recognition software. Similar sounding words can inadvertently be transcribed and may not be corrected upon review.  [name]., MD 04/20/2014

## 2014-05-11 ENCOUNTER — Ambulatory Visit: Payer: Medicare Other

## 2014-05-11 ENCOUNTER — Ambulatory Visit (HOSPITAL_BASED_OUTPATIENT_CLINIC_OR_DEPARTMENT_OTHER): Payer: Medicare Other

## 2014-05-11 ENCOUNTER — Ambulatory Visit (HOSPITAL_BASED_OUTPATIENT_CLINIC_OR_DEPARTMENT_OTHER): Payer: Medicare Other | Admitting: Physician Assistant

## 2014-05-11 ENCOUNTER — Other Ambulatory Visit (HOSPITAL_BASED_OUTPATIENT_CLINIC_OR_DEPARTMENT_OTHER): Payer: Medicare Other

## 2014-05-11 ENCOUNTER — Telehealth: Payer: Self-pay | Admitting: Physician Assistant

## 2014-05-11 ENCOUNTER — Encounter: Payer: Self-pay | Admitting: Physician Assistant

## 2014-05-11 VITALS — BP 131/60 | HR 70 | Temp 98.2°F | Resp 18 | Ht 62.0 in | Wt 149.2 lb

## 2014-05-11 DIAGNOSIS — Z5111 Encounter for antineoplastic chemotherapy: Secondary | ICD-10-CM

## 2014-05-11 DIAGNOSIS — C3432 Malignant neoplasm of lower lobe, left bronchus or lung: Secondary | ICD-10-CM

## 2014-05-11 DIAGNOSIS — C3411 Malignant neoplasm of upper lobe, right bronchus or lung: Secondary | ICD-10-CM

## 2014-05-11 DIAGNOSIS — C3431 Malignant neoplasm of lower lobe, right bronchus or lung: Secondary | ICD-10-CM

## 2014-05-11 DIAGNOSIS — C3490 Malignant neoplasm of unspecified part of unspecified bronchus or lung: Secondary | ICD-10-CM

## 2014-05-11 DIAGNOSIS — D709 Neutropenia, unspecified: Secondary | ICD-10-CM

## 2014-05-11 LAB — COMPREHENSIVE METABOLIC PANEL (CC13)
ALBUMIN: 3.4 g/dL — AB (ref 3.5–5.0)
ALT: 14 U/L (ref 0–55)
ANION GAP: 10 meq/L (ref 3–11)
AST: 25 U/L (ref 5–34)
Alkaline Phosphatase: 85 U/L (ref 40–150)
BUN: 10.3 mg/dL (ref 7.0–26.0)
CALCIUM: 9.1 mg/dL (ref 8.4–10.4)
CHLORIDE: 108 meq/L (ref 98–109)
CO2: 24 mEq/L (ref 22–29)
Creatinine: 0.8 mg/dL (ref 0.6–1.1)
EGFR: 68 mL/min/{1.73_m2} — ABNORMAL LOW (ref >90)
Glucose: 80 mg/dl (ref 70–140)
Potassium: 3.7 mEq/L (ref 3.5–5.1)
Sodium: 142 mEq/L (ref 136–145)
Total Bilirubin: 0.67 mg/dL (ref 0.20–1.20)
Total Protein: 6.6 g/dL (ref 6.4–8.3)

## 2014-05-11 LAB — CBC WITH DIFFERENTIAL/PLATELET
BASO%: 0.7 % (ref 0.0–2.0)
BASOS ABS: 0 10*3/uL (ref 0.0–0.1)
EOS ABS: 0 10*3/uL (ref 0.0–0.5)
EOS%: 1.2 % (ref 0.0–7.0)
HEMATOCRIT: 39.8 % (ref 34.8–46.6)
HEMOGLOBIN: 12.9 g/dL (ref 11.6–15.9)
LYMPH#: 1.6 10*3/uL (ref 0.9–3.3)
LYMPH%: 51.6 % — ABNORMAL HIGH (ref 14.0–49.7)
MCH: 33.3 pg (ref 25.1–34.0)
MCHC: 32.3 g/dL (ref 31.5–36.0)
MCV: 103.1 fL — ABNORMAL HIGH (ref 79.5–101.0)
MONO#: 0.4 10*3/uL (ref 0.1–0.9)
MONO%: 12.2 % (ref 0.0–14.0)
NEUT#: 1.1 10*3/uL — ABNORMAL LOW (ref 1.5–6.5)
NEUT%: 34.3 % — AB (ref 38.4–76.8)
Platelets: 262 10*3/uL (ref 145–400)
RBC: 3.86 10*6/uL (ref 3.70–5.45)
RDW: 14 % (ref 11.2–14.5)
WBC: 3.1 10*3/uL — ABNORMAL LOW (ref 3.9–10.3)

## 2014-05-11 MED ORDER — SODIUM CHLORIDE 0.9 % IV SOLN
500.0000 mg/m2 | Freq: Once | INTRAVENOUS | Status: AC
  Administered 2014-05-11: 850 mg via INTRAVENOUS
  Filled 2014-05-11: qty 34

## 2014-05-11 MED ORDER — DEXAMETHASONE SODIUM PHOSPHATE 20 MG/5ML IJ SOLN
20.0000 mg | Freq: Once | INTRAMUSCULAR | Status: AC
  Administered 2014-05-11: 20 mg via INTRAVENOUS

## 2014-05-11 MED ORDER — SODIUM CHLORIDE 0.9 % IV SOLN
Freq: Once | INTRAVENOUS | Status: AC
  Administered 2014-05-11: 12:00:00 via INTRAVENOUS

## 2014-05-11 MED ORDER — ONDANSETRON 16 MG/50ML IVPB (CHCC)
INTRAVENOUS | Status: AC
  Filled 2014-05-11: qty 16

## 2014-05-11 MED ORDER — HEPARIN SOD (PORK) LOCK FLUSH 100 UNIT/ML IV SOLN
500.0000 [IU] | Freq: Once | INTRAVENOUS | Status: AC | PRN
  Administered 2014-05-11: 500 [IU]
  Filled 2014-05-11: qty 5

## 2014-05-11 MED ORDER — DEXAMETHASONE SODIUM PHOSPHATE 20 MG/5ML IJ SOLN
INTRAMUSCULAR | Status: AC
  Filled 2014-05-11: qty 5

## 2014-05-11 MED ORDER — SODIUM CHLORIDE 0.9 % IJ SOLN
10.0000 mL | INTRAMUSCULAR | Status: DC | PRN
  Administered 2014-05-11: 10 mL
  Filled 2014-05-11: qty 10

## 2014-05-11 MED ORDER — ONDANSETRON 16 MG/50ML IVPB (CHCC)
16.0000 mg | Freq: Once | INTRAVENOUS | Status: AC
  Administered 2014-05-11: 16 mg via INTRAVENOUS

## 2014-05-11 NOTE — Progress Notes (Signed)
Okay to treat today with ANC: 1.1 per [place], PA.

## 2014-05-11 NOTE — Progress Notes (Addendum)
[facility] [phone]   Fax:(336) [phone]  VISIT PROGRESS NOTE  [name],[name], MD [place] [address]  Principle Diagnosis and stage: Recurrent non-small cell lung cancer, initially diagnosed a stage IB (T2, N0, M0) adenocarcinoma in June 2008 with a tumor size of 8 cm.   Prior Therapy:  #1 status post left upper lobectomy with lymph node dissection under the care of Dr. Arlyce Dice on 06/29/2006.  #2 status post 4 cycles of adjuvant chemotherapy with cisplatin and Taxotere last dose given 08/01/2006.  #3 status post 6 cycles of systemic chemotherapy with carboplatin and Alimta for disease recurrence last dose given 12/12/2009.   Current therapy: Maintenance chemotherapy with Alimta at 500 mg per meter squared given every 3 weeks status post 59 cycles.  CHEMOTHERAPY INTENT: Palliative/maintenance.  CURRENT # OF CHEMOTHERAPY CYCLES: 60  CURRENT ANTIEMETICS: Zofran, dexamethasone and Compazine  CURRENT SMOKING STATUS: Never Smoker.   ORAL CHEMOTHERAPY AND CONSENT: None  CURRENT BISPHOSPHONATES USE: None  PAIN MANAGEMENT: None  NARCOTICS INDUCED CONSTIPATION: None  LIVING WILL AND CODE STATUS: has living will for no CODE BLUE.  INTERVAL HISTORY: RAZIYA AVENI 80 y.o. female returns to the clinic today for follow up visit, accompanied by her daughter. The patient is feeling fine today with no specific complaints. She was initially going to be re-treated with a course of carboplatin and Alimta however reacted to carboplatin skin test in the carboplatin was removed from her course of treatment. This occurred in September 2015. She is tolerating her maintenance chemotherapy with single agent Alimta fairly well. The patient denied having any significant nausea or vomiting. She has no fever or chills. The she denied having any significant chest pain, shortness of breath, or hemoptysis. She has no weight loss or night sweats. She is  here today to start cycle #60.  MEDICAL HISTORY: Past Medical History  Diagnosis Date  . History of migraine headaches   . Hypercholesterolemia   . lung ca dx'd 06/2006    rt and lt lung  . Lung cancer 3/14/1    right- Adenocarcinoma w/bronchioalveolar features  . FHx: chemotherapy 2008&2011    4 cycles cisplatin,taxotere,2008& carboplatin and Alimta 2011    ALLERGIES:  is allergic to simvastatin and iodinated diagnostic agents.  MEDICATIONS:  Current Outpatient Prescriptions  Medication Sig Dispense Refill  . Calcium Carbonate-Vit D-Min (CALTRATE PLUS PO) Take 500 mg by mouth 2 (two) times daily.      . ferrous fumarate (HEMOCYTE - 106 MG FE) 325 (106 FE) MG TABS tablet Take 1 tablet by mouth daily.    . folic acid (FOLVITE) 1 MG tablet Take 1 mg by mouth daily.      . Multiple Vitamins-Minerals (CENTRUM SILVER PO) Take 1 tablet by mouth daily.     . potassium gluconate 595 MG TABS Take 595 mg by mouth daily.    . prochlorperazine (COMPAZINE) 10 MG tablet Take 1 tablet (10 mg total) by mouth every 6 (six) hours as needed. 15 tablet 1  . ranitidine (ZANTAC) 75 MG tablet Take 75 mg by mouth. Pt takes as needed    . vitamin B-12 (CYANOCOBALAMIN) 500 MCG tablet Take 1,500 mcg by mouth daily.     . chlorpheniramine-carbetapentane (TUSSI-12) 4-30 MG/5ML SUSP Take by mouth every 12 (twelve) hours as needed.    Marland Kitchen dexamethasone (DECADRON) 4 MG tablet Take 4 mg by mouth as directed. 1 tablet BID the day before, day of and day  after chemo     No current facility-administered medications for this visit.   Facility-Administered Medications Ordered in Other Visits  Medication Dose Route Frequency Provider Last Rate Last Dose  . sodium chloride 0.9 % injection 10 mL  10 mL Intracatheter PRN Curt Bears, MD   10 mL at 05/11/14 1234    REVIEW OF SYSTEMS:  Constitutional: positive for fatigue Eyes: negative Ears, nose, mouth, throat, and face: negative Respiratory:  negative Cardiovascular: negative Gastrointestinal: negative Genitourinary:negative Integument/breast: negative Hematologic/lymphatic: negative Musculoskeletal:negative Neurological: negative Behavioral/Psych: negative Endocrine: negative Allergic/Immunologic: negative   PHYSICAL EXAMINATION: General appearance: alert, cooperative and no distress Head: Normocephalic, without obvious abnormality, atraumatic Neck: no adenopathy Lymph nodes: Cervical, supraclavicular, and axillary nodes normal. Resp: clear to auscultation bilaterally Back: symmetric, no curvature. ROM normal. No CVA tenderness. Cardio: regular rate and rhythm, S1, S2 normal, no murmur, click, rub or gallop GI: soft, non-tender; bowel sounds normal; no masses,  no organomegaly Extremities: extremities normal, atraumatic, no cyanosis or edema Neurologic: Alert and oriented X 3, normal strength and tone. Normal symmetric reflexes. Normal coordination and gait  ECOG PERFORMANCE STATUS: 0 - Asymptomatic  Blood pressure 131/60, pulse 70, temperature 98.2 F (36.8 C), temperature source Oral, resp. rate 18, height 5\' 2"  (1.575 m), weight 149 lb 3.2 oz (67.677 kg).  LABORATORY DATA: Lab Results  Component Value Date   WBC 3.1* 05/11/2014   HGB 12.9 05/11/2014   HCT 39.8 05/11/2014   MCV 103.1* 05/11/2014   PLT 262 05/11/2014      Chemistry      Component Value Date/Time   NA 142 05/11/2014 1028   NA 139 01/24/2012 1008   NA 140 08/21/2011 1306   K 3.7 05/11/2014 1028   K 4.2 01/24/2012 1008   K 4.1 08/21/2011 1306   CL 108* 11/04/2012 0947   CL 103 01/24/2012 1008   CL 107 08/21/2011 1306   CO2 24 05/11/2014 1028   CO2 28 01/24/2012 1008   CO2 22 08/21/2011 1306   BUN 10.3 05/11/2014 1028   BUN 13 01/24/2012 1008   BUN 11 08/21/2011 1306   CREATININE 0.8 05/11/2014 1028   CREATININE 0.8 01/24/2012 1008   CREATININE 0.81 08/21/2011 1306      Component Value Date/Time   CALCIUM 9.1 05/11/2014 1028    CALCIUM 8.6 01/24/2012 1008   CALCIUM 8.8 08/21/2011 1306   ALKPHOS 85 05/11/2014 1028   ALKPHOS 79 01/24/2012 1008   ALKPHOS 87 08/21/2011 1306   AST 25 05/11/2014 1028   AST 24 01/24/2012 1008   AST 17 08/21/2011 1306   ALT 14 05/11/2014 1028   ALT 21 01/24/2012 1008   ALT 8 08/21/2011 1306   BILITOT 0.67 05/11/2014 1028   BILITOT 0.90 01/24/2012 1008   BILITOT 0.6 08/21/2011 1306       RADIOGRAPHIC STUDIES:  ASSESSMENT AND PLAN: This is a very pleasant 80 years old white female with recurrent non-small cell lung cancer, adenocarcinoma. The patient is currently undergoing maintenance chemotherapy with single agent Alimta status post 59 cycles and she is tolerating it fairly well. Her last CT scan of the chest showed no evidence for disease progression. Patient was discussed with and also seen by Dr. Julien Nordmann. She'll proceed with cycle #60 of single agent Alimta at 500 mg/m given every 3 weeks. She is slightly neutropenic with an ANC of 1.1 however her total white count is 3.1. She will proceed with treatment today as scheduled and neutropenic precautions were reviewed with her  and her daughter. Both voiced understanding. She'll follow-up in 3 weeks with a restaging CT scan of her chest without contrast to reevaluate her disease, prior to the start of cycle #61.  She was advised to call immediately if she has any concerning symptoms in the interval.  Disclaimer: This note was dictated with voice recognition software. Similar sounding words can inadvertently be transcribed and may not be corrected upon review.  Carlton Adam, [name] 05/11/2014  ADDENDUM: Hematology/Oncology Attending: I had a face to face encounter with the patient today. I recommended her care plan. This is a very pleasant 80 years old white female with metastatic non-small cell lung cancer, adenocarcinoma. She is currently on treatment with maintenance Alimta status post 59 cycles. She is rating her treatment well  with no significant adverse effects except for occasional fatigue. Her absolute neutrophil count is slightly low today. I recommended for the patient to proceed with cycle #60 has a scheduled. She will come back for follow-up visit in 3 weeks for reevaluation after repeating CT scan of the chest for restaging of her disease. She was advised to call immediately if she has any concerning symptoms in the interval.  Disclaimer: This note was dictated with voice recognition software. Similar sounding words can inadvertently be transcribed and may be missed upon review. [name]., MD 05/11/2014

## 2014-05-11 NOTE — Patient Instructions (Signed)
[facility] Discharge Instructions for Patients Receiving Chemotherapy  Today you received the following chemotherapy agents: Alimta.  To help prevent nausea and vomiting after your treatment, we encourage you to take your nausea medication as prescribed.   If you develop nausea and vomiting that is not controlled by your nausea medication, call the clinic.   BELOW ARE SYMPTOMS THAT SHOULD BE REPORTED IMMEDIATELY:  *FEVER GREATER THAN 100.5 F  *CHILLS WITH OR WITHOUT FEVER  NAUSEA AND VOMITING THAT IS NOT CONTROLLED WITH YOUR NAUSEA MEDICATION  *UNUSUAL SHORTNESS OF BREATH  *UNUSUAL BRUISING OR BLEEDING  TENDERNESS IN MOUTH AND THROAT WITH OR WITHOUT PRESENCE OF ULCERS  *URINARY PROBLEMS  *BOWEL PROBLEMS  UNUSUAL RASH Items with * indicate a potential emergency and should be followed up as soon as possible.  Feel free to call the clinic you have any questions or concerns. The clinic phone number is [phone].

## 2014-05-11 NOTE — Patient Instructions (Signed)
Follow up in 3 weeks with a restaging CT scan of your chest to re-evaluate your disease

## 2014-05-11 NOTE — Telephone Encounter (Signed)
Gave avs all appt already sch.

## 2014-05-30 ENCOUNTER — Ambulatory Visit (HOSPITAL_COMMUNITY)
Admission: RE | Admit: 2014-05-30 | Discharge: 2014-05-30 | Disposition: A | Discharge status: Home / Self Care | Payer: Medicare Other | Source: Ambulatory Visit | Attending: Physician Assistant | Admitting: Physician Assistant

## 2014-05-30 DIAGNOSIS — R911 Solitary pulmonary nodule: Secondary | ICD-10-CM | POA: Insufficient documentation

## 2014-05-30 DIAGNOSIS — C3411 Malignant neoplasm of upper lobe, right bronchus or lung: Secondary | ICD-10-CM

## 2014-05-31 ENCOUNTER — Encounter: Payer: Self-pay | Admitting: Pharmacist

## 2014-06-01 ENCOUNTER — Telehealth: Payer: Self-pay | Admitting: Internal Medicine

## 2014-06-01 ENCOUNTER — Other Ambulatory Visit (HOSPITAL_BASED_OUTPATIENT_CLINIC_OR_DEPARTMENT_OTHER): Payer: Medicare Other

## 2014-06-01 ENCOUNTER — Ambulatory Visit (HOSPITAL_BASED_OUTPATIENT_CLINIC_OR_DEPARTMENT_OTHER): Payer: Medicare Other

## 2014-06-01 ENCOUNTER — Ambulatory Visit (HOSPITAL_BASED_OUTPATIENT_CLINIC_OR_DEPARTMENT_OTHER): Payer: Medicare Other | Admitting: Internal Medicine

## 2014-06-01 ENCOUNTER — Encounter: Payer: Self-pay | Admitting: Internal Medicine

## 2014-06-01 VITALS — BP 153/85 | HR 74 | Temp 98.0°F | Resp 18 | Ht 62.0 in | Wt 151.5 lb

## 2014-06-01 DIAGNOSIS — Z808 Family history of malignant neoplasm of other organs or systems: Secondary | ICD-10-CM

## 2014-06-01 DIAGNOSIS — C3431 Malignant neoplasm of lower lobe, right bronchus or lung: Secondary | ICD-10-CM

## 2014-06-01 DIAGNOSIS — Z5111 Encounter for antineoplastic chemotherapy: Secondary | ICD-10-CM

## 2014-06-01 DIAGNOSIS — C3432 Malignant neoplasm of lower lobe, left bronchus or lung: Secondary | ICD-10-CM

## 2014-06-01 DIAGNOSIS — C3411 Malignant neoplasm of upper lobe, right bronchus or lung: Secondary | ICD-10-CM

## 2014-06-01 DIAGNOSIS — C3490 Malignant neoplasm of unspecified part of unspecified bronchus or lung: Secondary | ICD-10-CM

## 2014-06-01 LAB — COMPREHENSIVE METABOLIC PANEL (CC13)
ALBUMIN: 3.3 g/dL — AB (ref 3.5–5.0)
ALT: 12 U/L (ref 0–55)
AST: 24 U/L (ref 5–34)
Alkaline Phosphatase: 87 U/L (ref 40–150)
Anion Gap: 10 mEq/L (ref 3–11)
BUN: 9.7 mg/dL (ref 7.0–26.0)
CALCIUM: 8.7 mg/dL (ref 8.4–10.4)
CHLORIDE: 109 meq/L (ref 98–109)
CO2: 24 mEq/L (ref 22–29)
Creatinine: 0.8 mg/dL (ref 0.6–1.1)
EGFR: 69 mL/min/{1.73_m2} — AB (ref >90)
Glucose: 113 mg/dl (ref 70–140)
POTASSIUM: 3.3 meq/L — AB (ref 3.5–5.1)
Sodium: 143 mEq/L (ref 136–145)
Total Bilirubin: 0.47 mg/dL (ref 0.20–1.20)
Total Protein: 6.5 g/dL (ref 6.4–8.3)

## 2014-06-01 LAB — CBC WITH DIFFERENTIAL/PLATELET
BASO%: 0.4 % (ref 0.0–2.0)
Basophils Absolute: 0 10*3/uL (ref 0.0–0.1)
EOS%: 1.4 % (ref 0.0–7.0)
Eosinophils Absolute: 0 10*3/uL (ref 0.0–0.5)
HCT: 38.6 % (ref 34.8–46.6)
HGB: 12.7 g/dL (ref 11.6–15.9)
LYMPH#: 1.1 10*3/uL (ref 0.9–3.3)
LYMPH%: 37 % (ref 14.0–49.7)
MCH: 33.2 pg (ref 25.1–34.0)
MCHC: 32.9 g/dL (ref 31.5–36.0)
MCV: 101 fL (ref 79.5–101.0)
MONO#: 0.3 10*3/uL (ref 0.1–0.9)
MONO%: 11.6 % (ref 0.0–14.0)
NEUT#: 1.4 10*3/uL — ABNORMAL LOW (ref 1.5–6.5)
NEUT%: 49.6 % (ref 38.4–76.8)
Platelets: 217 10*3/uL (ref 145–400)
RBC: 3.82 10*6/uL (ref 3.70–5.45)
RDW: 14.2 % (ref 11.2–14.5)
WBC: 2.8 10*3/uL — ABNORMAL LOW (ref 3.9–10.3)

## 2014-06-01 MED ORDER — ONDANSETRON 16 MG/50ML IVPB (CHCC)
16.0000 mg | Freq: Once | INTRAVENOUS | Status: AC
  Administered 2014-06-01: 16 mg via INTRAVENOUS

## 2014-06-01 MED ORDER — SODIUM CHLORIDE 0.9 % IV SOLN
Freq: Once | INTRAVENOUS | Status: AC
  Administered 2014-06-01: 11:00:00 via INTRAVENOUS

## 2014-06-01 MED ORDER — DEXAMETHASONE SODIUM PHOSPHATE 20 MG/5ML IJ SOLN
INTRAMUSCULAR | Status: AC
  Filled 2014-06-01: qty 5

## 2014-06-01 MED ORDER — ONDANSETRON 16 MG/50ML IVPB (CHCC)
INTRAVENOUS | Status: AC
  Filled 2014-06-01: qty 16

## 2014-06-01 MED ORDER — HEPARIN SOD (PORK) LOCK FLUSH 100 UNIT/ML IV SOLN
500.0000 [IU] | Freq: Once | INTRAVENOUS | Status: AC | PRN
  Administered 2014-06-01: 500 [IU]
  Filled 2014-06-01: qty 5

## 2014-06-01 MED ORDER — SODIUM CHLORIDE 0.9 % IJ SOLN
10.0000 mL | INTRAMUSCULAR | Status: DC | PRN
  Administered 2014-06-01: 10 mL
  Filled 2014-06-01: qty 10

## 2014-06-01 MED ORDER — CYANOCOBALAMIN 1000 MCG/ML IJ SOLN
1000.0000 ug | Freq: Once | INTRAMUSCULAR | Status: AC
  Administered 2014-06-01: 1000 ug via INTRAMUSCULAR

## 2014-06-01 MED ORDER — CYANOCOBALAMIN 1000 MCG/ML IJ SOLN
INTRAMUSCULAR | Status: AC
  Filled 2014-06-01: qty 1

## 2014-06-01 MED ORDER — SODIUM CHLORIDE 0.9 % IV SOLN
500.0000 mg/m2 | Freq: Once | INTRAVENOUS | Status: AC
  Administered 2014-06-01: 850 mg via INTRAVENOUS
  Filled 2014-06-01: qty 34

## 2014-06-01 MED ORDER — DEXAMETHASONE SODIUM PHOSPHATE 20 MG/5ML IJ SOLN
20.0000 mg | Freq: Once | INTRAMUSCULAR | Status: AC
  Administered 2014-06-01: 20 mg via INTRAVENOUS

## 2014-06-01 NOTE — Progress Notes (Signed)
[facility] [phone]   [phone]  VISIT PROGRESS NOTE  [name],[name], MD [place] [address]  Principle Diagnosis and stage: Recurrent non-small cell lung cancer, initially diagnosed a stage IB (T2, N0, M0) adenocarcinoma in June 2008 with a tumor size of 8 cm.   Prior Therapy:  #1 status post left upper lobectomy with lymph node dissection under the care of Dr. Arlyce Dice on 06/29/2006.  #2 status post 4 cycles of adjuvant chemotherapy with cisplatin and Taxotere last dose given 08/01/2006.  #3 status post 6 cycles of systemic chemotherapy with carboplatin and Alimta for disease recurrence last dose given 12/12/2009.   Current therapy: Maintenance chemotherapy with Alimta at 500 mg per meter squared given every 3 weeks status post 60 cycles.  CHEMOTHERAPY INTENT: Palliative/maintenance.  CURRENT # OF CHEMOTHERAPY CYCLES: 61  CURRENT ANTIEMETICS: Zofran, dexamethasone and Compazine  CURRENT SMOKING STATUS: Never Smoker.   ORAL CHEMOTHERAPY AND CONSENT: None  CURRENT BISPHOSPHONATES USE: None  PAIN MANAGEMENT: None  NARCOTICS INDUCED CONSTIPATION: None  LIVING WILL AND CODE STATUS: has living will for no CODE BLUE.  INTERVAL HISTORY: [name] 80 y.o. female returns to the clinic today for follow up visit. The patient is feeling fine today with no specific complaints. She is tolerating her maintenance chemotherapy with single agent Alimta fairly well. She enjoyed her Christmas with her family and the patient gained 2 pounds. Her younger sister recently diagnosed with colon cancer and currently receiving treatment under the care of Dr. Benay Spice. The patient denied having any significant nausea or vomiting. She has no fever or chills. The she denied having any significant chest pain, shortness of breath, or hemoptysis. She has no weight loss or night sweats. She had repeat CT scan of the chest performed recently  and she is here for evaluation and discussion of her scan results.  MEDICAL HISTORY: Past Medical History  Diagnosis Date  . History of migraine headaches   . Hypercholesterolemia   . lung ca dx'd 06/2006    rt and lt lung  . Lung cancer 3/14/1    right- Adenocarcinoma w/bronchioalveolar features  . FHx: chemotherapy 2008&2011    4 cycles cisplatin,taxotere,2008& carboplatin and Alimta 2011    ALLERGIES:  is allergic to simvastatin and iodinated diagnostic agents.  MEDICATIONS:  Current Outpatient Prescriptions  Medication Sig Dispense Refill  . Calcium Carbonate-Vit D-Min (CALTRATE PLUS PO) Take 500 mg by mouth 2 (two) times daily.      . chlorpheniramine-carbetapentane (TUSSI-12) 4-30 MG/5ML SUSP Take by mouth every 12 (twelve) hours as needed.    Marland Kitchen dexamethasone (DECADRON) 4 MG tablet Take 4 mg by mouth as directed. 1 tablet BID the day before, day of and day after chemo    . ferrous fumarate (HEMOCYTE - 106 MG FE) 325 (106 FE) MG TABS tablet Take 1 tablet by mouth daily.    . folic acid (FOLVITE) 1 MG tablet Take 1 mg by mouth daily.      . Multiple Vitamins-Minerals (CENTRUM SILVER PO) Take 1 tablet by mouth daily.     . potassium gluconate 595 MG TABS Take 595 mg by mouth daily.    . prochlorperazine (COMPAZINE) 10 MG tablet Take 1 tablet (10 mg total) by mouth every 6 (six) hours as needed. 15 tablet 1  . ranitidine (ZANTAC) 75 MG tablet Take 75 mg by mouth. Pt takes as needed    . vitamin B-12 (CYANOCOBALAMIN) 500 MCG  tablet Take 1,500 mcg by mouth daily.      No current facility-administered medications for this visit.    REVIEW OF SYSTEMS:  Constitutional: negative Eyes: negative Ears, nose, mouth, throat, and face: negative Respiratory: negative Cardiovascular: negative Gastrointestinal: negative Genitourinary:negative Integument/breast: negative Hematologic/lymphatic: negative Musculoskeletal:negative Neurological: negative Behavioral/Psych:  negative Endocrine: negative Allergic/Immunologic: negative   PHYSICAL EXAMINATION: General appearance: alert, cooperative and no distress Head: Normocephalic, without obvious abnormality, atraumatic Neck: no adenopathy Lymph nodes: Cervical, supraclavicular, and axillary nodes normal. Resp: clear to auscultation bilaterally Back: symmetric, no curvature. ROM normal. No CVA tenderness. Cardio: regular rate and rhythm, S1, S2 normal, no murmur, click, rub or gallop GI: soft, non-tender; bowel sounds normal; no masses,  no organomegaly Extremities: extremities normal, atraumatic, no cyanosis or edema Neurologic: Alert and oriented X 3, normal strength and tone. Normal symmetric reflexes. Normal coordination and gait  ECOG PERFORMANCE STATUS: 0 - Asymptomatic  Blood pressure 153/85, pulse 74, temperature 98 F (36.7 C), temperature source Oral, resp. rate 18, height 5\' 2"  (1.575 m), weight 151 lb 8 oz (68.72 kg), SpO2 100 %.  LABORATORY DATA: Lab Results  Component Value Date   WBC 2.8* 06/01/2014   HGB 12.7 06/01/2014   HCT 38.6 06/01/2014   MCV 101.0 06/01/2014   PLT 217 06/01/2014      Chemistry      Component Value Date/Time   NA 142 05/11/2014 1028   NA 139 01/24/2012 1008   NA 140 08/21/2011 1306   K 3.7 05/11/2014 1028   K 4.2 01/24/2012 1008   K 4.1 08/21/2011 1306   CL 108* 11/04/2012 0947   CL 103 01/24/2012 1008   CL 107 08/21/2011 1306   CO2 24 05/11/2014 1028   CO2 28 01/24/2012 1008   CO2 22 08/21/2011 1306   BUN 10.3 05/11/2014 1028   BUN 13 01/24/2012 1008   BUN 11 08/21/2011 1306   CREATININE 0.8 05/11/2014 1028   CREATININE 0.8 01/24/2012 1008   CREATININE 0.81 08/21/2011 1306      Component Value Date/Time   CALCIUM 9.1 05/11/2014 1028   CALCIUM 8.6 01/24/2012 1008   CALCIUM 8.8 08/21/2011 1306   ALKPHOS 85 05/11/2014 1028   ALKPHOS 79 01/24/2012 1008   ALKPHOS 87 08/21/2011 1306   AST 25 05/11/2014 1028   AST 24 01/24/2012 1008   AST 17  08/21/2011 1306   ALT 14 05/11/2014 1028   ALT 21 01/24/2012 1008   ALT 8 08/21/2011 1306   BILITOT 0.67 05/11/2014 1028   BILITOT 0.90 01/24/2012 1008   BILITOT 0.6 08/21/2011 1306       RADIOGRAPHIC STUDIES: Ct Chest Wo Contrast  05/30/2014   CLINICAL DATA:  Followup right lung non-small-cell carcinoma. Ongoing chemotherapy. Restaging.  EXAM: CT CHEST WITHOUT CONTRAST  TECHNIQUE: Multidetector CT imaging of the chest was performed following the standard protocol without IV contrast.  COMPARISON:  03/25/2014  FINDINGS: Mediastinum/Hilar Regions: No masses or pathologically enlarged lymph nodes identified.  Other Thoracic Lymphadenopathy:  None.  Lungs: Poorly defined mass in the right upper lobe shows no significant change in size since previous study. This currently measures 3.2 x 3.4 cm on image 22 compared to 3.0 x 3.3 cm previously. Associated opacity extending peripherally in the anterior right upper lobe along the minor fissure is also stable.  Nodular opacity in the right middle lobe adjacent to the major fissure measures 13 mm on image 39 is stable since previous study.  Ill-defined subpleural nodular opacity in the  lingula on image 33 measures 12 mm all and is not significant changed in size wall compared to previous study. The  No new pulmonary nodules or infiltrates identified.  Pleura:  No evidence of effusion or mass.  Vascular/Cardiac:  No acute findings identified.  Other:  None.  Musculoskeletal:  No suspicious bone lesions identified.  IMPRESSION: No significant change in right upper lobe mass.  Stable subpleural of nodular opacities in the right middle and left upper lobes.   Electronically Signed   By: Earle Gell M.D.   On: 05/30/2014 15:16   ASSESSMENT AND PLAN: This is a very pleasant 80 years old white female with recurrent non-small cell lung cancer, adenocarcinoma. The patient is currently undergoing maintenance chemotherapy with single agent Alimta status post 60 cycles and  she is tolerating it fairly well.  The recent CT scan of the chest showed no evidence for disease progression. I discussed the scan results with the patient today. I recommended for the patient to proceed with her maintenance chemotherapy with single agent Alimta as scheduled. We'll proceed today with cycle #61 as scheduled.  She would come back for followup visit in 3 weeks with the next cycle of her chemotherapy. She was advised to call immediately if she has any concerning symptoms in the interval.  Disclaimer: This note was dictated with voice recognition software. Similar sounding words can inadvertently be transcribed and may not be corrected upon review.  [name]., MD 06/01/2014

## 2014-06-01 NOTE — Telephone Encounter (Signed)
gv and printed appt sched and avs fo rpt for Jan 2016....sed added tx.

## 2014-06-01 NOTE — Patient Instructions (Signed)
[name] Discharge Instructions for Patients Receiving Chemotherapy  Today you received the following chemotherapy agents; Alimta.   To help prevent nausea and vomiting after your treatment, we encourage you to take your nausea medication as directed.    If you develop nausea and vomiting that is not controlled by your nausea medication, call the clinic.   BELOW ARE SYMPTOMS THAT SHOULD BE REPORTED IMMEDIATELY:  *FEVER GREATER THAN 100.5 F  *CHILLS WITH OR WITHOUT FEVER  NAUSEA AND VOMITING THAT IS NOT CONTROLLED WITH YOUR NAUSEA MEDICATION  *UNUSUAL SHORTNESS OF BREATH  *UNUSUAL BRUISING OR BLEEDING  TENDERNESS IN MOUTH AND THROAT WITH OR WITHOUT PRESENCE OF ULCERS  *URINARY PROBLEMS  *BOWEL PROBLEMS  UNUSUAL RASH Items with * indicate a potential emergency and should be followed up as soon as possible.  Feel free to call the clinic you have any questions or concerns. The clinic phone number is [phone].

## 2014-06-09 ENCOUNTER — Telehealth: Payer: Self-pay

## 2014-06-09 NOTE — Telephone Encounter (Signed)
-----   Message from Carlton Adam, [name] sent at 06/09/2014  9:18 AM EST ----- Abnormal results, please call  and notify patient to increase her intake of potassium rich foods

## 2014-06-10 NOTE — Telephone Encounter (Signed)
Spoke with Terri Piedra, pts daughter. Informed her pt potassium was slightly low at 3.3 and to have her increase potassium rich foods, examples were given. Ms. Laurann Montana verbalized understanding and states she will inform pt. Denies any further questions or concerns.

## 2014-06-10 NOTE — Telephone Encounter (Signed)
Left message on [name] voicemail to have the pt or her call back regarding lab results.

## 2014-06-22 ENCOUNTER — Other Ambulatory Visit (HOSPITAL_BASED_OUTPATIENT_CLINIC_OR_DEPARTMENT_OTHER): Payer: Medicare Other

## 2014-06-22 ENCOUNTER — Encounter: Payer: Self-pay | Admitting: Physician Assistant

## 2014-06-22 ENCOUNTER — Ambulatory Visit (HOSPITAL_BASED_OUTPATIENT_CLINIC_OR_DEPARTMENT_OTHER): Payer: Medicare Other

## 2014-06-22 ENCOUNTER — Ambulatory Visit (HOSPITAL_BASED_OUTPATIENT_CLINIC_OR_DEPARTMENT_OTHER): Payer: Medicare Other | Admitting: Physician Assistant

## 2014-06-22 VITALS — BP 136/64 | HR 72 | Temp 98.1°F | Resp 18 | Ht 62.0 in | Wt 149.1 lb

## 2014-06-22 DIAGNOSIS — C3431 Malignant neoplasm of lower lobe, right bronchus or lung: Secondary | ICD-10-CM

## 2014-06-22 DIAGNOSIS — C3411 Malignant neoplasm of upper lobe, right bronchus or lung: Secondary | ICD-10-CM

## 2014-06-22 DIAGNOSIS — C3490 Malignant neoplasm of unspecified part of unspecified bronchus or lung: Secondary | ICD-10-CM

## 2014-06-22 DIAGNOSIS — Z5111 Encounter for antineoplastic chemotherapy: Secondary | ICD-10-CM

## 2014-06-22 DIAGNOSIS — C3432 Malignant neoplasm of lower lobe, left bronchus or lung: Secondary | ICD-10-CM

## 2014-06-22 LAB — CBC WITH DIFFERENTIAL/PLATELET
BASO%: 1.3 % (ref 0.0–2.0)
Basophils Absolute: 0.1 10*3/uL (ref 0.0–0.1)
EOS%: 2.7 % (ref 0.0–7.0)
Eosinophils Absolute: 0.1 10*3/uL (ref 0.0–0.5)
HCT: 39.5 % (ref 34.8–46.6)
HGB: 12.8 g/dL (ref 11.6–15.9)
LYMPH#: 1.6 10*3/uL (ref 0.9–3.3)
LYMPH%: 35.7 % (ref 14.0–49.7)
MCH: 32.3 pg (ref 25.1–34.0)
MCHC: 32.4 g/dL (ref 31.5–36.0)
MCV: 99.8 fL (ref 79.5–101.0)
MONO#: 0.6 10*3/uL (ref 0.1–0.9)
MONO%: 13.4 % (ref 0.0–14.0)
NEUT#: 2.1 10*3/uL (ref 1.5–6.5)
NEUT%: 46.9 % (ref 38.4–76.8)
Platelets: 322 10*3/uL (ref 145–400)
RBC: 3.96 10*6/uL (ref 3.70–5.45)
RDW: 13.9 % (ref 11.2–14.5)
WBC: 4.4 10*3/uL (ref 3.9–10.3)

## 2014-06-22 LAB — COMPREHENSIVE METABOLIC PANEL (CC13)
ALT: 12 U/L (ref 0–55)
ANION GAP: 8 meq/L (ref 3–11)
AST: 24 U/L (ref 5–34)
Albumin: 3.5 g/dL (ref 3.5–5.0)
Alkaline Phosphatase: 91 U/L (ref 40–150)
BUN: 12.5 mg/dL (ref 7.0–26.0)
CO2: 25 meq/L (ref 22–29)
Calcium: 8.7 mg/dL (ref 8.4–10.4)
Chloride: 106 mEq/L (ref 98–109)
Creatinine: 0.8 mg/dL (ref 0.6–1.1)
EGFR: 71 mL/min/{1.73_m2} — ABNORMAL LOW (ref >90)
GLUCOSE: 82 mg/dL (ref 70–140)
POTASSIUM: 4.3 meq/L (ref 3.5–5.1)
SODIUM: 139 meq/L (ref 136–145)
Total Bilirubin: 0.53 mg/dL (ref 0.20–1.20)
Total Protein: 6.9 g/dL (ref 6.4–8.3)

## 2014-06-22 MED ORDER — ONDANSETRON 16 MG/50ML IVPB (CHCC)
16.0000 mg | Freq: Once | INTRAVENOUS | Status: AC
  Administered 2014-06-22: 16 mg via INTRAVENOUS

## 2014-06-22 MED ORDER — ONDANSETRON 16 MG/50ML IVPB (CHCC)
INTRAVENOUS | Status: AC
  Filled 2014-06-22: qty 16

## 2014-06-22 MED ORDER — DEXAMETHASONE SODIUM PHOSPHATE 20 MG/5ML IJ SOLN
INTRAMUSCULAR | Status: AC
  Filled 2014-06-22: qty 5

## 2014-06-22 MED ORDER — HEPARIN SOD (PORK) LOCK FLUSH 100 UNIT/ML IV SOLN
500.0000 [IU] | Freq: Once | INTRAVENOUS | Status: AC | PRN
  Administered 2014-06-22: 500 [IU]
  Filled 2014-06-22: qty 5

## 2014-06-22 MED ORDER — DEXAMETHASONE SODIUM PHOSPHATE 20 MG/5ML IJ SOLN
20.0000 mg | Freq: Once | INTRAMUSCULAR | Status: AC
  Administered 2014-06-22: 20 mg via INTRAVENOUS

## 2014-06-22 MED ORDER — SODIUM CHLORIDE 0.9 % IV SOLN
500.0000 mg/m2 | Freq: Once | INTRAVENOUS | Status: AC
  Administered 2014-06-22: 850 mg via INTRAVENOUS
  Filled 2014-06-22: qty 34

## 2014-06-22 MED ORDER — SODIUM CHLORIDE 0.9 % IV SOLN
Freq: Once | INTRAVENOUS | Status: AC
  Administered 2014-06-22: 13:00:00 via INTRAVENOUS

## 2014-06-22 MED ORDER — SODIUM CHLORIDE 0.9 % IJ SOLN
10.0000 mL | INTRAMUSCULAR | Status: DC | PRN
  Administered 2014-06-22: 10 mL
  Filled 2014-06-22: qty 10

## 2014-06-22 NOTE — Patient Instructions (Signed)
[facility] Discharge Instructions for Patients Receiving Chemotherapy  Today you received the following chemotherapy agents alimta  To help prevent nausea and vomiting after your treatment, we encourage you to take your nausea medication if needed.   If you develop nausea and vomiting that is not controlled by your nausea medication, call the clinic.   BELOW ARE SYMPTOMS THAT SHOULD BE REPORTED IMMEDIATELY:  *FEVER GREATER THAN 100.5 F  *CHILLS WITH OR WITHOUT FEVER  NAUSEA AND VOMITING THAT IS NOT CONTROLLED WITH YOUR NAUSEA MEDICATION  *UNUSUAL SHORTNESS OF BREATH  *UNUSUAL BRUISING OR BLEEDING  TENDERNESS IN MOUTH AND THROAT WITH OR WITHOUT PRESENCE OF ULCERS  *URINARY PROBLEMS  *BOWEL PROBLEMS  UNUSUAL RASH Items with * indicate a potential emergency and should be followed up as soon as possible.  Feel free to call the clinic you have any questions or concerns. The clinic phone number is [phone].

## 2014-06-22 NOTE — Patient Instructions (Signed)
Follow-up in 3 weeks prior to next scheduled cycle of maintenance chemotherapy

## 2014-06-22 NOTE — Progress Notes (Addendum)
[facility] Telephone:([phone]   [phone]  VISIT PROGRESS NOTE  [name],[name], MD [place] [address]  Principle Diagnosis and stage: Recurrent non-small cell lung cancer, initially diagnosed a stage IB (T2, N0, M0) adenocarcinoma in June 2008 with a tumor size of 8 cm.   Prior Therapy:  #1 status post left upper lobectomy with lymph node dissection under the care of Dr. Arlyce Dice on 06/29/2006.  #2 status post 4 cycles of adjuvant chemotherapy with cisplatin and Taxotere last dose given 08/01/2006.  #3 status post 6 cycles of systemic chemotherapy with carboplatin and Alimta for disease recurrence last dose given 12/12/2009.   Current therapy: Maintenance chemotherapy with Alimta at 500 mg per meter squared given every 3 weeks status post 61 cycles.  CHEMOTHERAPY INTENT: Palliative/maintenance.  CURRENT # OF CHEMOTHERAPY CYCLES: 62  CURRENT ANTIEMETICS: Zofran, dexamethasone and Compazine  CURRENT SMOKING STATUS: Never Smoker.   ORAL CHEMOTHERAPY AND CONSENT: None  CURRENT BISPHOSPHONATES USE: None  PAIN MANAGEMENT: None  NARCOTICS INDUCED CONSTIPATION: None  LIVING WILL AND CODE STATUS: has living will for no CODE BLUE.  INTERVAL HISTORY: [name] 81 y.o. female returns to the clinic today for follow up visit. The patient is feeling fine today with no specific complaints. She is tolerating her maintenance chemotherapy with single agent Alimta fairly well. The patient denied having any significant nausea or vomiting. She has no fever or chills. The she denied having any significant chest pain, shortness of breath, or hemoptysis. She has no weight loss or night sweats. She presents to proceed with cycle #62.  MEDICAL HISTORY: Past Medical History  Diagnosis Date  . History of migraine headaches   . Hypercholesterolemia   . lung ca dx'd 06/2006    rt and lt lung  . Lung cancer 3/14/1    right- Adenocarcinoma  w/bronchioalveolar features  . FHx: chemotherapy 2008&2011    4 cycles cisplatin,taxotere,2008& carboplatin and Alimta 2011    ALLERGIES:  is allergic to simvastatin and iodinated diagnostic agents.  MEDICATIONS:  Current Outpatient Prescriptions  Medication Sig Dispense Refill  . Calcium Carbonate-Vit D-Min (CALTRATE PLUS PO) Take 500 mg by mouth 2 (two) times daily.      . chlorpheniramine-carbetapentane (TUSSI-12) 4-30 MG/5ML SUSP Take by mouth every 12 (twelve) hours as needed.    Marland Kitchen dexamethasone (DECADRON) 4 MG tablet Take 4 mg by mouth as directed. 1 tablet BID the day before, day of and day after chemo    . ferrous fumarate (HEMOCYTE - 106 MG FE) 325 (106 FE) MG TABS tablet Take 1 tablet by mouth daily.    . folic acid (FOLVITE) 1 MG tablet Take 1 mg by mouth daily.      . Multiple Vitamins-Minerals (CENTRUM SILVER PO) Take 1 tablet by mouth daily.     . potassium gluconate 595 MG TABS Take 595 mg by mouth daily.    . prochlorperazine (COMPAZINE) 10 MG tablet Take 1 tablet (10 mg total) by mouth every 6 (six) hours as needed. 15 tablet 1  . ranitidine (ZANTAC) 75 MG tablet Take 75 mg by mouth. Pt takes as needed    . vitamin B-12 (CYANOCOBALAMIN) 500 MCG tablet Take 1,500 mcg by mouth daily.      No current facility-administered medications for this visit.   Facility-Administered Medications Ordered in Other Visits  Medication Dose Route Frequency Provider Last Rate Last Dose  . sodium chloride 0.9 % injection 10 mL  10 mL Intracatheter PRN Curt Bears, MD   10 mL at 06/22/14 1335    REVIEW OF SYSTEMS:  Constitutional: negative Eyes: negative Ears, nose, mouth, throat, and face: negative Respiratory: negative Cardiovascular: negative Gastrointestinal: negative Genitourinary:negative Integument/breast: negative Hematologic/lymphatic: negative Musculoskeletal:negative Neurological: negative Behavioral/Psych: negative Endocrine: negative Allergic/Immunologic: negative    PHYSICAL EXAMINATION: General appearance: alert, cooperative and no distress Head: Normocephalic, without obvious abnormality, atraumatic Neck: no adenopathy Lymph nodes: Cervical, supraclavicular, and axillary nodes normal. Resp: clear to auscultation bilaterally Back: symmetric, no curvature. ROM normal. No CVA tenderness. Cardio: regular rate and rhythm, S1, S2 normal, no murmur, click, rub or gallop GI: soft, non-tender; bowel sounds normal; no masses,  no organomegaly Extremities: extremities normal, atraumatic, no cyanosis or edema Neurologic: Alert and oriented X 3, normal strength and tone. Normal symmetric reflexes. Normal coordination and gait  ECOG PERFORMANCE STATUS: 0 - Asymptomatic  Blood pressure 136/64, pulse 72, temperature 98.1 F (36.7 C), temperature source Oral, resp. rate 18, height 5\' 2"  (1.575 m), weight 149 lb 1.6 oz (67.631 kg), SpO2 100 %.  LABORATORY DATA: Lab Results  Component Value Date   WBC 4.4 06/22/2014   HGB 12.8 06/22/2014   HCT 39.5 06/22/2014   MCV 99.8 06/22/2014   PLT 322 06/22/2014      Chemistry      Component Value Date/Time   NA 139 06/22/2014 1056   NA 139 01/24/2012 1008   NA 140 08/21/2011 1306   K 4.3 06/22/2014 1056   K 4.2 01/24/2012 1008   K 4.1 08/21/2011 1306   CL 108* 11/04/2012 0947   CL 103 01/24/2012 1008   CL 107 08/21/2011 1306   CO2 25 06/22/2014 1056   CO2 28 01/24/2012 1008   CO2 22 08/21/2011 1306   BUN 12.5 06/22/2014 1056   BUN 13 01/24/2012 1008   BUN 11 08/21/2011 1306   CREATININE 0.8 06/22/2014 1056   CREATININE 0.8 01/24/2012 1008   CREATININE 0.81 08/21/2011 1306      Component Value Date/Time   CALCIUM 8.7 06/22/2014 1056   CALCIUM 8.6 01/24/2012 1008   CALCIUM 8.8 08/21/2011 1306   ALKPHOS 91 06/22/2014 1056   ALKPHOS 79 01/24/2012 1008   ALKPHOS 87 08/21/2011 1306   AST 24 06/22/2014 1056   AST 24 01/24/2012 1008   AST 17 08/21/2011 1306   ALT 12 06/22/2014 1056   ALT 21  01/24/2012 1008   ALT 8 08/21/2011 1306   BILITOT 0.53 06/22/2014 1056   BILITOT 0.90 01/24/2012 1008   BILITOT 0.6 08/21/2011 1306       RADIOGRAPHIC STUDIES: Ct Chest Wo Contrast  05/30/2014   CLINICAL DATA:  Followup right lung non-small-cell carcinoma. Ongoing chemotherapy. Restaging.  EXAM: CT CHEST WITHOUT CONTRAST  TECHNIQUE: Multidetector CT imaging of the chest was performed following the standard protocol without IV contrast.  COMPARISON:  03/25/2014  FINDINGS: Mediastinum/Hilar Regions: No masses or pathologically enlarged lymph nodes identified.  Other Thoracic Lymphadenopathy:  None.  Lungs: Poorly defined mass in the right upper lobe shows no significant change in size since previous study. This currently measures 3.2 x 3.4 cm on image 22 compared to 3.0 x 3.3 cm previously. Associated opacity extending peripherally in the anterior right upper lobe along the minor fissure is also stable.  Nodular opacity in the right middle lobe adjacent to the major fissure measures 13 mm on image 39 is stable since previous study.  Ill-defined subpleural nodular opacity in the lingula on image 33 measures  12 mm all and is not significant changed in size wall compared to previous study. The  No new pulmonary nodules or infiltrates identified.  Pleura:  No evidence of effusion or mass.  Vascular/Cardiac:  No acute findings identified.  Other:  None.  Musculoskeletal:  No suspicious bone lesions identified.  IMPRESSION: No significant change in right upper lobe mass.  Stable subpleural of nodular opacities in the right middle and left upper lobes.   Electronically Signed   By: Earle Gell M.D.   On: 05/30/2014 15:16   ASSESSMENT AND PLAN: This is a very pleasant 81 years old white female with recurrent non-small cell lung cancer, adenocarcinoma. The patient is currently undergoing maintenance chemotherapy with single agent Alimta status post 60 cycles and she is tolerating it fairly well.  The recent CT  scan of the chest showed no evidence for disease progression. The patient was discussed with and also seen by Dr. Julien Nordmann. She will continue with her maintenance chemotherapy with single agent Alimta as scheduled. She will proceed today with cycle #62 as scheduled.  She follow-up in 3 weeks prior to cycle #63. She was advised to call immediately if she has any concerning symptoms in the interval.  Disclaimer: This note was dictated with voice recognition software. Similar sounding words can inadvertently be transcribed and may not be corrected upon review.  Carlton Adam, [name] 06/22/2014  ADDENDUM: Hematology/Oncology Attending: I had a face to face encounter with the patient today. I recommended her care plan. This is a very pleasant 81 years old white female with recurrent non-small cell lung cancer, adenocarcinoma. She is currently undergoing maintenance chemotherapy with single agent Alimta status post 61 cycles and tolerating her treatment fairly well. I recommended for the patient to proceed with cycle #62 today as scheduled. She would come back for follow-up visit. She would come back for follow-up visit in 3 weeks with the next cycle of her treatment. The patient was advised to call immediately if she has any concerning symptoms in the interval.  Disclaimer: This note was dictated with voice recognition software. Similar sounding words can inadvertently be transcribed and may be missed upon review. [name]., MD 06/22/2014

## 2014-06-23 ENCOUNTER — Telehealth: Payer: Self-pay | Admitting: *Deleted

## 2014-06-23 NOTE — Telephone Encounter (Signed)
Per staff message and POF I have scheduled appts. Advised scheduler of appts. JMW  

## 2014-07-13 ENCOUNTER — Telehealth: Payer: Self-pay | Admitting: *Deleted

## 2014-07-13 ENCOUNTER — Other Ambulatory Visit (HOSPITAL_BASED_OUTPATIENT_CLINIC_OR_DEPARTMENT_OTHER): Payer: Medicare Other

## 2014-07-13 ENCOUNTER — Encounter: Payer: Self-pay | Admitting: Physician Assistant

## 2014-07-13 ENCOUNTER — Ambulatory Visit (HOSPITAL_BASED_OUTPATIENT_CLINIC_OR_DEPARTMENT_OTHER): Payer: Medicare Other

## 2014-07-13 ENCOUNTER — Telehealth: Payer: Self-pay | Admitting: Physician Assistant

## 2014-07-13 ENCOUNTER — Ambulatory Visit (HOSPITAL_BASED_OUTPATIENT_CLINIC_OR_DEPARTMENT_OTHER): Payer: Medicare Other | Admitting: Physician Assistant

## 2014-07-13 VITALS — BP 142/72 | HR 76 | Temp 97.9°F | Resp 18 | Ht 62.0 in | Wt 149.2 lb

## 2014-07-13 DIAGNOSIS — C3490 Malignant neoplasm of unspecified part of unspecified bronchus or lung: Secondary | ICD-10-CM

## 2014-07-13 DIAGNOSIS — C3432 Malignant neoplasm of lower lobe, left bronchus or lung: Secondary | ICD-10-CM

## 2014-07-13 DIAGNOSIS — C3431 Malignant neoplasm of lower lobe, right bronchus or lung: Secondary | ICD-10-CM

## 2014-07-13 DIAGNOSIS — Z5111 Encounter for antineoplastic chemotherapy: Secondary | ICD-10-CM

## 2014-07-13 LAB — CBC WITH DIFFERENTIAL/PLATELET
BASO%: 0.9 % (ref 0.0–2.0)
Basophils Absolute: 0 10*3/uL (ref 0.0–0.1)
EOS%: 2 % (ref 0.0–7.0)
Eosinophils Absolute: 0.1 10*3/uL (ref 0.0–0.5)
HCT: 38.5 % (ref 34.8–46.6)
HGB: 12.1 g/dL (ref 11.6–15.9)
LYMPH%: 32.8 % (ref 14.0–49.7)
MCH: 31.6 pg (ref 25.1–34.0)
MCHC: 31.6 g/dL (ref 31.5–36.0)
MCV: 100.2 fL (ref 79.5–101.0)
MONO#: 0.5 10*3/uL (ref 0.1–0.9)
MONO%: 12 % (ref 0.0–14.0)
NEUT#: 2.2 10*3/uL (ref 1.5–6.5)
NEUT%: 52.3 % (ref 38.4–76.8)
Platelets: 283 10*3/uL (ref 145–400)
RBC: 3.84 10*6/uL (ref 3.70–5.45)
RDW: 14.5 % (ref 11.2–14.5)
WBC: 4.3 10*3/uL (ref 3.9–10.3)
lymph#: 1.4 10*3/uL (ref 0.9–3.3)

## 2014-07-13 LAB — COMPREHENSIVE METABOLIC PANEL (CC13)
ALT: 12 U/L (ref 0–55)
AST: 23 U/L (ref 5–34)
Albumin: 3.3 g/dL — ABNORMAL LOW (ref 3.5–5.0)
Alkaline Phosphatase: 87 U/L (ref 40–150)
Anion Gap: 8 mEq/L (ref 3–11)
BILIRUBIN TOTAL: 0.69 mg/dL (ref 0.20–1.20)
BUN: 16.5 mg/dL (ref 7.0–26.0)
CO2: 25 mEq/L (ref 22–29)
Calcium: 9 mg/dL (ref 8.4–10.4)
Chloride: 107 mEq/L (ref 98–109)
Creatinine: 0.8 mg/dL (ref 0.6–1.1)
EGFR: 67 mL/min/{1.73_m2} — AB (ref >90)
GLUCOSE: 88 mg/dL (ref 70–140)
Potassium: 3.9 mEq/L (ref 3.5–5.1)
SODIUM: 141 meq/L (ref 136–145)
Total Protein: 6.6 g/dL (ref 6.4–8.3)

## 2014-07-13 MED ORDER — HEPARIN SOD (PORK) LOCK FLUSH 100 UNIT/ML IV SOLN
500.0000 [IU] | Freq: Once | INTRAVENOUS | Status: AC | PRN
  Administered 2014-07-13: 500 [IU]
  Filled 2014-07-13: qty 5

## 2014-07-13 MED ORDER — DEXAMETHASONE SODIUM PHOSPHATE 20 MG/5ML IJ SOLN
20.0000 mg | Freq: Once | INTRAMUSCULAR | Status: AC
Start: 2014-07-13 — End: 2014-07-13
  Administered 2014-07-13: 20 mg via INTRAVENOUS

## 2014-07-13 MED ORDER — SODIUM CHLORIDE 0.9 % IV SOLN
Freq: Once | INTRAVENOUS | Status: AC
  Administered 2014-07-13: 11:00:00 via INTRAVENOUS

## 2014-07-13 MED ORDER — SODIUM CHLORIDE 0.9 % IV SOLN
500.0000 mg/m2 | Freq: Once | INTRAVENOUS | Status: AC
  Administered 2014-07-13: 850 mg via INTRAVENOUS
  Filled 2014-07-13: qty 34

## 2014-07-13 MED ORDER — DEXAMETHASONE SODIUM PHOSPHATE 20 MG/5ML IJ SOLN
INTRAMUSCULAR | Status: AC
  Filled 2014-07-13: qty 5

## 2014-07-13 MED ORDER — ONDANSETRON 16 MG/50ML IVPB (CHCC)
INTRAVENOUS | Status: AC
  Filled 2014-07-13: qty 16

## 2014-07-13 MED ORDER — SODIUM CHLORIDE 0.9 % IJ SOLN
10.0000 mL | INTRAMUSCULAR | Status: DC | PRN
  Administered 2014-07-13: 10 mL
  Filled 2014-07-13: qty 10

## 2014-07-13 MED ORDER — ONDANSETRON 16 MG/50ML IVPB (CHCC)
16.0000 mg | Freq: Once | INTRAVENOUS | Status: AC
  Administered 2014-07-13: 16 mg via INTRAVENOUS

## 2014-07-13 NOTE — Telephone Encounter (Signed)
Per staff message and POF I have scheduled appts. Advised scheduler of appts. JMW  

## 2014-07-13 NOTE — Patient Instructions (Signed)
[facility] Discharge Instructions for Patients Receiving Chemotherapy  Today you received the following chemotherapy agents Alimta.  To help prevent nausea and vomiting after your treatment, we encourage you to take your nausea medication.   If you develop nausea and vomiting that is not controlled by your nausea medication, call the clinic.   BELOW ARE SYMPTOMS THAT SHOULD BE REPORTED IMMEDIATELY:  *FEVER GREATER THAN 100.5 F  *CHILLS WITH OR WITHOUT FEVER  NAUSEA AND VOMITING THAT IS NOT CONTROLLED WITH YOUR NAUSEA MEDICATION  *UNUSUAL SHORTNESS OF BREATH  *UNUSUAL BRUISING OR BLEEDING  TENDERNESS IN MOUTH AND THROAT WITH OR WITHOUT PRESENCE OF ULCERS  *URINARY PROBLEMS  *BOWEL PROBLEMS  UNUSUAL RASH Items with * indicate a potential emergency and should be followed up as soon as possible.  Feel free to call the clinic you have any questions or concerns. The clinic phone number is [phone].

## 2014-07-13 NOTE — Progress Notes (Signed)
[facility] Telephone:(336) [phone]   Fax:(336) [phone]  VISIT PROGRESS NOTE  [name],[name], MD [place] [address]  Principle Diagnosis and stage: Recurrent non-small cell lung cancer, initially diagnosed a stage IB (T2, N0, M0) adenocarcinoma in June 2008 with a tumor size of 8 cm.   Prior Therapy:  #1 status post left upper lobectomy with lymph node dissection under the care of Dr. Arlyce Dice on 06/29/2006.  #2 status post 4 cycles of adjuvant chemotherapy with cisplatin and Taxotere last dose given 08/01/2006.  #3 status post 6 cycles of systemic chemotherapy with carboplatin and Alimta for disease recurrence last dose given 12/12/2009.   Current therapy: Maintenance chemotherapy with Alimta at 500 mg per meter squared given every 3 weeks status post 62 cycles.  CHEMOTHERAPY INTENT: Palliative/maintenance.  CURRENT # OF CHEMOTHERAPY CYCLES: 63  CURRENT ANTIEMETICS: Zofran, dexamethasone and Compazine  CURRENT SMOKING STATUS: Never Smoker.   ORAL CHEMOTHERAPY AND CONSENT: None  CURRENT BISPHOSPHONATES USE: None  PAIN MANAGEMENT: None  NARCOTICS INDUCED CONSTIPATION: None  LIVING WILL AND CODE STATUS: has living will for no CODE BLUE.  INTERVAL HISTORY: [name] 81 y.o. female returns to the clinic today for follow up visit. The patient is feeling fine today with no specific complaints. She is tolerating her maintenance chemotherapy with single agent Alimta fairly well. The patient denied having any significant nausea or vomiting. She has no fever or chills. The she denied having any significant chest pain, shortness of breath, or hemoptysis. She has no weight loss or night sweats. She presents to proceed with cycle #63.  MEDICAL HISTORY: Past Medical History  Diagnosis Date  . History of migraine headaches   . Hypercholesterolemia   . lung ca dx'd 06/2006    rt and lt lung  . Lung cancer 3/14/1    right- Adenocarcinoma  w/bronchioalveolar features  . FHx: chemotherapy 2008&2011    4 cycles cisplatin,taxotere,2008& carboplatin and Alimta 2011    ALLERGIES:  is allergic to simvastatin and iodinated diagnostic agents.  MEDICATIONS:  Current Outpatient Prescriptions  Medication Sig Dispense Refill  . Calcium Carbonate-Vit D-Min (CALTRATE PLUS PO) Take 500 mg by mouth 2 (two) times daily.      . chlorpheniramine-carbetapentane (TUSSI-12) 4-30 MG/5ML SUSP Take by mouth every 12 (twelve) hours as needed.    Marland Kitchen dexamethasone (DECADRON) 4 MG tablet Take 4 mg by mouth as directed. 1 tablet BID the day before, day of and day after chemo    . ferrous fumarate (HEMOCYTE - 106 MG FE) 325 (106 FE) MG TABS tablet Take 1 tablet by mouth daily.    . folic acid (FOLVITE) 1 MG tablet Take 1 mg by mouth daily.      . Multiple Vitamins-Minerals (CENTRUM SILVER PO) Take 1 tablet by mouth daily.     . potassium gluconate 595 MG TABS Take 595 mg by mouth daily.    . prochlorperazine (COMPAZINE) 10 MG tablet Take 1 tablet (10 mg total) by mouth every 6 (six) hours as needed. 15 tablet 1  . ranitidine (ZANTAC) 75 MG tablet Take 75 mg by mouth. Pt takes as needed    . vitamin B-12 (CYANOCOBALAMIN) 500 MCG tablet Take 1,500 mcg by mouth daily.      No current facility-administered medications for this visit.   Facility-Administered Medications Ordered in Other Visits  Medication Dose Route Frequency Provider Last Rate Last Dose  . sodium chloride 0.9 % injection 10 mL  10 mL Intracatheter PRN Curt Bears, MD   10 mL at 07/13/14 1208    REVIEW OF SYSTEMS:  Constitutional: negative Eyes: negative Ears, nose, mouth, throat, and face: negative Respiratory: negative Cardiovascular: negative Gastrointestinal: negative Genitourinary:negative Integument/breast: negative Hematologic/lymphatic: negative Musculoskeletal:negative Neurological: negative Behavioral/Psych: negative Endocrine: negative Allergic/Immunologic: negative    PHYSICAL EXAMINATION: General appearance: alert, cooperative and no distress Head: Normocephalic, without obvious abnormality, atraumatic Neck: no adenopathy Lymph nodes: Cervical, supraclavicular, and axillary nodes normal. Resp: clear to auscultation bilaterally Back: symmetric, no curvature. ROM normal. No CVA tenderness. Cardio: regular rate and rhythm, S1, S2 normal, no murmur, click, rub or gallop GI: soft, non-tender; bowel sounds normal; no masses,  no organomegaly Extremities: extremities normal, atraumatic, no cyanosis or edema Neurologic: Alert and oriented X 3, normal strength and tone. Normal symmetric reflexes. Normal coordination and gait  ECOG PERFORMANCE STATUS: 0 - Asymptomatic  Blood pressure 142/72, pulse 76, temperature 97.9 F (36.6 C), temperature source Oral, resp. rate 18, height 5\' 2"  (1.575 m), weight 149 lb 3.2 oz (67.677 kg), SpO2 100 %.  LABORATORY DATA: Lab Results  Component Value Date   WBC 4.3 07/13/2014   HGB 12.1 07/13/2014   HCT 38.5 07/13/2014   MCV 100.2 07/13/2014   PLT 283 07/13/2014      Chemistry      Component Value Date/Time   NA 141 07/13/2014 0920   NA 139 01/24/2012 1008   NA 140 08/21/2011 1306   K 3.9 07/13/2014 0920   K 4.2 01/24/2012 1008   K 4.1 08/21/2011 1306   CL 108* 11/04/2012 0947   CL 103 01/24/2012 1008   CL 107 08/21/2011 1306   CO2 25 07/13/2014 0920   CO2 28 01/24/2012 1008   CO2 22 08/21/2011 1306   BUN 16.5 07/13/2014 0920   BUN 13 01/24/2012 1008   BUN 11 08/21/2011 1306   CREATININE 0.8 07/13/2014 0920   CREATININE 0.8 01/24/2012 1008   CREATININE 0.81 08/21/2011 1306      Component Value Date/Time   CALCIUM 9.0 07/13/2014 0920   CALCIUM 8.6 01/24/2012 1008   CALCIUM 8.8 08/21/2011 1306   ALKPHOS 87 07/13/2014 0920   ALKPHOS 79 01/24/2012 1008   ALKPHOS 87 08/21/2011 1306   AST 23 07/13/2014 0920   AST 24 01/24/2012 1008   AST 17 08/21/2011 1306   ALT 12 07/13/2014 0920   ALT 21  01/24/2012 1008   ALT 8 08/21/2011 1306   BILITOT 0.69 07/13/2014 0920   BILITOT 0.90 01/24/2012 1008   BILITOT 0.6 08/21/2011 1306       RADIOGRAPHIC STUDIES: No results found. ASSESSMENT AND PLAN: This is a very pleasant 81 years old white female with recurrent non-small cell lung cancer, adenocarcinoma. The patient is currently undergoing maintenance chemotherapy with single agent Alimta status post 60 cycles and she is tolerating it fairly well.  The recent CT scan of the chest showed no evidence for disease progression. She will continue with her maintenance chemotherapy with single agent Alimta as scheduled. She will proceed today with cycle #63 as scheduled.  She follow-up in 3 weeks prior to cycle #64. She was advised to call immediately if she has any concerning symptoms in the interval.  Disclaimer: This note was dictated with voice recognition software. Similar sounding words can inadvertently be transcribed and may not be corrected upon review.  Carlton Adam, [name] 07/13/2014

## 2014-07-13 NOTE — Telephone Encounter (Signed)
Gave avs & calendar for March. Sent message to schedule treatment.

## 2014-07-13 NOTE — Patient Instructions (Signed)
Follow up in 3 weeks, prior to your next scheduled cycle of chemotherapy

## 2014-08-03 ENCOUNTER — Telehealth: Payer: Self-pay | Admitting: *Deleted

## 2014-08-03 ENCOUNTER — Encounter: Payer: Self-pay | Admitting: Internal Medicine

## 2014-08-03 ENCOUNTER — Ambulatory Visit (HOSPITAL_BASED_OUTPATIENT_CLINIC_OR_DEPARTMENT_OTHER): Payer: Medicare Other | Admitting: Internal Medicine

## 2014-08-03 ENCOUNTER — Other Ambulatory Visit: Payer: Self-pay | Admitting: Medical Oncology

## 2014-08-03 ENCOUNTER — Ambulatory Visit (HOSPITAL_BASED_OUTPATIENT_CLINIC_OR_DEPARTMENT_OTHER): Payer: Medicare Other

## 2014-08-03 ENCOUNTER — Other Ambulatory Visit (HOSPITAL_BASED_OUTPATIENT_CLINIC_OR_DEPARTMENT_OTHER): Payer: Medicare Other

## 2014-08-03 ENCOUNTER — Telehealth: Payer: Self-pay | Admitting: Internal Medicine

## 2014-08-03 VITALS — BP 120/98 | HR 72 | Temp 97.9°F | Resp 18 | Ht 62.0 in | Wt 148.7 lb

## 2014-08-03 DIAGNOSIS — C3490 Malignant neoplasm of unspecified part of unspecified bronchus or lung: Secondary | ICD-10-CM

## 2014-08-03 DIAGNOSIS — C3411 Malignant neoplasm of upper lobe, right bronchus or lung: Secondary | ICD-10-CM

## 2014-08-03 DIAGNOSIS — Z5111 Encounter for antineoplastic chemotherapy: Secondary | ICD-10-CM

## 2014-08-03 DIAGNOSIS — C3431 Malignant neoplasm of lower lobe, right bronchus or lung: Secondary | ICD-10-CM

## 2014-08-03 LAB — CBC WITH DIFFERENTIAL/PLATELET
BASO%: 0.6 % (ref 0.0–2.0)
BASOS ABS: 0 10*3/uL (ref 0.0–0.1)
EOS ABS: 0 10*3/uL (ref 0.0–0.5)
EOS%: 1.2 % (ref 0.0–7.0)
HCT: 39.3 % (ref 34.8–46.6)
HEMOGLOBIN: 13 g/dL (ref 11.6–15.9)
LYMPH%: 44.5 % (ref 14.0–49.7)
MCH: 33.2 pg (ref 25.1–34.0)
MCHC: 33.1 g/dL (ref 31.5–36.0)
MCV: 100.5 fL (ref 79.5–101.0)
MONO#: 0.5 10*3/uL (ref 0.1–0.9)
MONO%: 13.3 % (ref 0.0–14.0)
NEUT#: 1.4 10*3/uL — ABNORMAL LOW (ref 1.5–6.5)
NEUT%: 40.4 % (ref 38.4–76.8)
Platelets: 279 10*3/uL (ref 145–400)
RBC: 3.91 10*6/uL (ref 3.70–5.45)
RDW: 15.2 % — ABNORMAL HIGH (ref 11.2–14.5)
WBC: 3.5 10*3/uL — ABNORMAL LOW (ref 3.9–10.3)
lymph#: 1.5 10*3/uL (ref 0.9–3.3)

## 2014-08-03 LAB — COMPREHENSIVE METABOLIC PANEL (CC13)
ALBUMIN: 3.4 g/dL — AB (ref 3.5–5.0)
ALT: 8 U/L (ref 0–55)
ANION GAP: 11 meq/L (ref 3–11)
AST: 23 U/L (ref 5–34)
Alkaline Phosphatase: 81 U/L (ref 40–150)
BUN: 11.6 mg/dL (ref 7.0–26.0)
CALCIUM: 9.2 mg/dL (ref 8.4–10.4)
CHLORIDE: 108 meq/L (ref 98–109)
CO2: 23 meq/L (ref 22–29)
CREATININE: 0.8 mg/dL (ref 0.6–1.1)
EGFR: 66 mL/min/{1.73_m2} — AB (ref >90)
GLUCOSE: 78 mg/dL (ref 70–140)
POTASSIUM: 3.9 meq/L (ref 3.5–5.1)
Sodium: 142 mEq/L (ref 136–145)
Total Bilirubin: 0.76 mg/dL (ref 0.20–1.20)
Total Protein: 6.8 g/dL (ref 6.4–8.3)

## 2014-08-03 MED ORDER — ONDANSETRON 16 MG/50ML IVPB (CHCC)
INTRAVENOUS | Status: AC
  Filled 2014-08-03: qty 16

## 2014-08-03 MED ORDER — CYANOCOBALAMIN 1000 MCG/ML IJ SOLN
1000.0000 ug | Freq: Once | INTRAMUSCULAR | Status: AC
  Administered 2014-08-03: 1000 ug via INTRAMUSCULAR

## 2014-08-03 MED ORDER — DEXAMETHASONE SODIUM PHOSPHATE 20 MG/5ML IJ SOLN
INTRAMUSCULAR | Status: AC
  Filled 2014-08-03: qty 5

## 2014-08-03 MED ORDER — DEXAMETHASONE SODIUM PHOSPHATE 20 MG/5ML IJ SOLN
20.0000 mg | Freq: Once | INTRAMUSCULAR | Status: AC
  Administered 2014-08-03: 20 mg via INTRAVENOUS

## 2014-08-03 MED ORDER — CYANOCOBALAMIN 1000 MCG/ML IJ SOLN
INTRAMUSCULAR | Status: AC
  Filled 2014-08-03: qty 1

## 2014-08-03 MED ORDER — HEPARIN SOD (PORK) LOCK FLUSH 100 UNIT/ML IV SOLN
500.0000 [IU] | Freq: Once | INTRAVENOUS | Status: AC | PRN
  Administered 2014-08-03: 500 [IU]
  Filled 2014-08-03: qty 5

## 2014-08-03 MED ORDER — SODIUM CHLORIDE 0.9 % IV SOLN
500.0000 mg/m2 | Freq: Once | INTRAVENOUS | Status: AC
  Administered 2014-08-03: 850 mg via INTRAVENOUS
  Filled 2014-08-03: qty 34

## 2014-08-03 MED ORDER — SODIUM CHLORIDE 0.9 % IJ SOLN
10.0000 mL | INTRAMUSCULAR | Status: DC | PRN
  Administered 2014-08-03: 10 mL
  Filled 2014-08-03: qty 10

## 2014-08-03 MED ORDER — ONDANSETRON 16 MG/50ML IVPB (CHCC)
16.0000 mg | Freq: Once | INTRAVENOUS | Status: AC
  Administered 2014-08-03: 16 mg via INTRAVENOUS

## 2014-08-03 MED ORDER — SODIUM CHLORIDE 0.9 % IV SOLN
Freq: Once | INTRAVENOUS | Status: AC
  Administered 2014-08-03: 12:00:00 via INTRAVENOUS

## 2014-08-03 NOTE — Telephone Encounter (Signed)
Gave avs & calendar for March/April. Sent message to schedule tx.

## 2014-08-03 NOTE — Telephone Encounter (Signed)
Per staff message and POF I have scheduled appts. Advised scheduler of appts. JMW  

## 2014-08-03 NOTE — Progress Notes (Signed)
[facility] Telephone:([phone]   Fax:(336) [phone]  VISIT PROGRESS NOTE  [name],[name], MD [place] [address]  Principle Diagnosis and stage: Recurrent non-small cell lung cancer, initially diagnosed a stage IB (T2, N0, M0) adenocarcinoma in June 2008 with a tumor size of 8 cm.   Prior Therapy:  #1 status post left upper lobectomy with lymph node dissection under the care of Dr. Arlyce Dice on 06/29/2006.  #2 status post 4 cycles of adjuvant chemotherapy with cisplatin and Taxotere last dose given 08/01/2006.  #3 status post 6 cycles of systemic chemotherapy with carboplatin and Alimta for disease recurrence last dose given 12/12/2009.   Current therapy: Maintenance chemotherapy with Alimta at 500 mg per meter squared given every 3 weeks status post 63 cycles.  CHEMOTHERAPY INTENT: Palliative/maintenance.  CURRENT # OF CHEMOTHERAPY CYCLES: 64  CURRENT ANTIEMETICS: Zofran, dexamethasone and Compazine  CURRENT SMOKING STATUS: Never Smoker.   ORAL CHEMOTHERAPY AND CONSENT: None  CURRENT BISPHOSPHONATES USE: None  PAIN MANAGEMENT: None  NARCOTICS INDUCED CONSTIPATION: None  LIVING WILL AND CODE STATUS: has living will for no CODE BLUE.  INTERVAL HISTORY: [name] 81 y.o. female returns to the clinic today for follow up visit. The patient is feeling fine today with no specific complaints. She is tolerating her maintenance chemotherapy with single agent Alimta fairly well. The patient denied having any significant nausea or vomiting. She has no fever or chills. The she denied having any significant chest pain, shortness of breath, or hemoptysis. She has no weight loss or night sweats. She is here today to start cycle #64.  MEDICAL HISTORY: Past Medical History  Diagnosis Date  . History of migraine headaches   . Hypercholesterolemia   . lung ca dx'd 06/2006    rt and lt lung  . Lung cancer 3/14/1    right- Adenocarcinoma  w/bronchioalveolar features  . FHx: chemotherapy 2008&2011    4 cycles cisplatin,taxotere,2008& carboplatin and Alimta 2011    ALLERGIES:  is allergic to simvastatin and iodinated diagnostic agents.  MEDICATIONS:  Current Outpatient Prescriptions  Medication Sig Dispense Refill  . Calcium Carbonate-Vit D-Min (CALTRATE PLUS PO) Take 500 mg by mouth 2 (two) times daily.      . chlorpheniramine-carbetapentane (TUSSI-12) 4-30 MG/5ML SUSP Take by mouth every 12 (twelve) hours as needed.    Marland Kitchen dexamethasone (DECADRON) 4 MG tablet Take 4 mg by mouth as directed. 1 tablet BID the day before, day of and day after chemo    . ferrous fumarate (HEMOCYTE - 106 MG FE) 325 (106 FE) MG TABS tablet Take 1 tablet by mouth daily.    . folic acid (FOLVITE) 1 MG tablet Take 1 mg by mouth daily.      . Multiple Vitamins-Minerals (CENTRUM SILVER PO) Take 1 tablet by mouth daily.     . potassium gluconate 595 MG TABS Take 595 mg by mouth daily.    . ranitidine (ZANTAC) 75 MG tablet Take 75 mg by mouth. Pt takes as needed    . vitamin B-12 (CYANOCOBALAMIN) 500 MCG tablet Take 1,500 mcg by mouth daily.     . prochlorperazine (COMPAZINE) 10 MG tablet Take 1 tablet (10 mg total) by mouth every 6 (six) hours as needed. (Patient not taking: Reported on 08/03/2014) 15 tablet 1   No current facility-administered medications for this visit.    REVIEW OF SYSTEMS:  Constitutional: negative Eyes: negative Ears, nose, mouth, throat, and face: negative Respiratory: negative  Cardiovascular: negative Gastrointestinal: negative Genitourinary:negative Integument/breast: negative Hematologic/lymphatic: negative Musculoskeletal:negative Neurological: negative Behavioral/Psych: negative Endocrine: negative Allergic/Immunologic: negative   PHYSICAL EXAMINATION: General appearance: alert, cooperative and no distress Head: Normocephalic, without obvious abnormality, atraumatic Neck: no adenopathy Lymph nodes: Cervical,  supraclavicular, and axillary nodes normal. Resp: clear to auscultation bilaterally Back: symmetric, no curvature. ROM normal. No CVA tenderness. Cardio: regular rate and rhythm, S1, S2 normal, no murmur, click, rub or gallop GI: soft, non-tender; bowel sounds normal; no masses,  no organomegaly Extremities: extremities normal, atraumatic, no cyanosis or edema Neurologic: Alert and oriented X 3, normal strength and tone. Normal symmetric reflexes. Normal coordination and gait  ECOG PERFORMANCE STATUS: 0 - Asymptomatic  Blood pressure 120/98, pulse 72, temperature 97.9 F (36.6 C), temperature source Oral, resp. rate 18, height 5\' 2"  (1.575 m), weight 148 lb 11.2 oz (67.45 kg), SpO2 100 %.  LABORATORY DATA: Lab Results  Component Value Date   WBC 3.5* 08/03/2014   HGB 13.0 08/03/2014   HCT 39.3 08/03/2014   MCV 100.5 08/03/2014   PLT 279 08/03/2014      Chemistry      Component Value Date/Time   NA 141 07/13/2014 0920   NA 139 01/24/2012 1008   NA 140 08/21/2011 1306   K 3.9 07/13/2014 0920   K 4.2 01/24/2012 1008   K 4.1 08/21/2011 1306   CL 108* 11/04/2012 0947   CL 103 01/24/2012 1008   CL 107 08/21/2011 1306   CO2 25 07/13/2014 0920   CO2 28 01/24/2012 1008   CO2 22 08/21/2011 1306   BUN 16.5 07/13/2014 0920   BUN 13 01/24/2012 1008   BUN 11 08/21/2011 1306   CREATININE 0.8 07/13/2014 0920   CREATININE 0.8 01/24/2012 1008   CREATININE 0.81 08/21/2011 1306      Component Value Date/Time   CALCIUM 9.0 07/13/2014 0920   CALCIUM 8.6 01/24/2012 1008   CALCIUM 8.8 08/21/2011 1306   ALKPHOS 87 07/13/2014 0920   ALKPHOS 79 01/24/2012 1008   ALKPHOS 87 08/21/2011 1306   AST 23 07/13/2014 0920   AST 24 01/24/2012 1008   AST 17 08/21/2011 1306   ALT 12 07/13/2014 0920   ALT 21 01/24/2012 1008   ALT 8 08/21/2011 1306   BILITOT 0.69 07/13/2014 0920   BILITOT 0.90 01/24/2012 1008   BILITOT 0.6 08/21/2011 1306       RADIOGRAPHIC STUDIES: No results  found. ASSESSMENT AND PLAN: This is a very pleasant 81 years old white female with recurrent non-small cell lung cancer, adenocarcinoma. The patient is currently undergoing maintenance chemotherapy with single agent Alimta status post 63 cycles and she is tolerating it fairly well.  I recommended for the patient to proceed with her maintenance chemotherapy with single agent Alimta as scheduled. We'll proceed today with cycle #64 as scheduled.  She would come back for followup visit in 3 weeks with the next cycle of her chemotherapy after repeating CT scan of the chest for evaluation of her disease. She was advised to call immediately if she has any concerning symptoms in the interval.  Disclaimer: This note was dictated with voice recognition software. Similar sounding words can inadvertently be transcribed and may not be corrected upon review.  [name]., MD 08/03/2014

## 2014-08-03 NOTE — Patient Instructions (Signed)
[facility] Discharge Instructions for Patients Receiving Chemotherapy  Today you received the following chemotherapy agent: Alimta  To help prevent nausea and vomiting after your treatment, we encourage you to take your nausea medication as prescribed.    If you develop nausea and vomiting that is not controlled by your nausea medication, call the clinic.   BELOW ARE SYMPTOMS THAT SHOULD BE REPORTED IMMEDIATELY:  *FEVER GREATER THAN 100.5 F  *CHILLS WITH OR WITHOUT FEVER  NAUSEA AND VOMITING THAT IS NOT CONTROLLED WITH YOUR NAUSEA MEDICATION  *UNUSUAL SHORTNESS OF BREATH  *UNUSUAL BRUISING OR BLEEDING  TENDERNESS IN MOUTH AND THROAT WITH OR WITHOUT PRESENCE OF ULCERS  *URINARY PROBLEMS  *BOWEL PROBLEMS  UNUSUAL RASH Items with * indicate a potential emergency and should be followed up as soon as possible.  Feel free to call the clinic you have any questions or concerns. The clinic phone number is [phone].

## 2014-08-17 ENCOUNTER — Other Ambulatory Visit (HOSPITAL_BASED_OUTPATIENT_CLINIC_OR_DEPARTMENT_OTHER): Payer: Medicare Other

## 2014-08-17 ENCOUNTER — Encounter (HOSPITAL_COMMUNITY): Payer: Self-pay

## 2014-08-17 ENCOUNTER — Ambulatory Visit (HOSPITAL_COMMUNITY)
Admission: RE | Admit: 2014-08-17 | Discharge: 2014-08-17 | Disposition: A | Discharge status: Home / Self Care | Payer: Medicare Other | Source: Ambulatory Visit | Attending: Internal Medicine | Admitting: Internal Medicine

## 2014-08-17 DIAGNOSIS — C3490 Malignant neoplasm of unspecified part of unspecified bronchus or lung: Secondary | ICD-10-CM | POA: Insufficient documentation

## 2014-08-17 DIAGNOSIS — C3432 Malignant neoplasm of lower lobe, left bronchus or lung: Secondary | ICD-10-CM

## 2014-08-17 DIAGNOSIS — I2510 Atherosclerotic heart disease of native coronary artery without angina pectoris: Secondary | ICD-10-CM | POA: Diagnosis not present

## 2014-08-17 LAB — COMPREHENSIVE METABOLIC PANEL (CC13)
ALT: 14 U/L (ref 0–55)
ANION GAP: 10 meq/L (ref 3–11)
AST: 23 U/L (ref 5–34)
Albumin: 3.4 g/dL — ABNORMAL LOW (ref 3.5–5.0)
Alkaline Phosphatase: 77 U/L (ref 40–150)
BUN: 12.4 mg/dL (ref 7.0–26.0)
CO2: 24 meq/L (ref 22–29)
CREATININE: 0.8 mg/dL (ref 0.6–1.1)
Calcium: 9 mg/dL (ref 8.4–10.4)
Chloride: 110 mEq/L — ABNORMAL HIGH (ref 98–109)
EGFR: 70 mL/min/{1.73_m2} — AB (ref >90)
Glucose: 97 mg/dl (ref 70–140)
Potassium: 3.9 mEq/L (ref 3.5–5.1)
Sodium: 144 mEq/L (ref 136–145)
Total Bilirubin: 0.56 mg/dL (ref 0.20–1.20)
Total Protein: 6.9 g/dL (ref 6.4–8.3)

## 2014-08-17 LAB — CBC WITH DIFFERENTIAL/PLATELET
BASO%: 0.2 % (ref 0.0–2.0)
Basophils Absolute: 0 10*3/uL (ref 0.0–0.1)
EOS ABS: 0 10*3/uL (ref 0.0–0.5)
EOS%: 1 % (ref 0.0–7.0)
HCT: 38.8 % (ref 34.8–46.6)
HGB: 12.3 g/dL (ref 11.6–15.9)
LYMPH%: 44.1 % (ref 14.0–49.7)
MCH: 32.4 pg (ref 25.1–34.0)
MCHC: 31.8 g/dL (ref 31.5–36.0)
MCV: 101.8 fL — ABNORMAL HIGH (ref 79.5–101.0)
MONO#: 0.5 10*3/uL (ref 0.1–0.9)
MONO%: 13.3 % (ref 0.0–14.0)
NEUT#: 1.5 10*3/uL (ref 1.5–6.5)
NEUT%: 41.4 % (ref 38.4–76.8)
Platelets: 196 10*3/uL (ref 145–400)
RBC: 3.81 10*6/uL (ref 3.70–5.45)
RDW: 15.7 % — ABNORMAL HIGH (ref 11.2–14.5)
WBC: 3.7 10*3/uL — ABNORMAL LOW (ref 3.9–10.3)
lymph#: 1.6 10*3/uL (ref 0.9–3.3)

## 2014-08-24 ENCOUNTER — Telehealth: Payer: Self-pay | Admitting: Internal Medicine

## 2014-08-24 ENCOUNTER — Ambulatory Visit: Payer: Medicare Other | Admitting: Physician Assistant

## 2014-08-24 ENCOUNTER — Ambulatory Visit (HOSPITAL_BASED_OUTPATIENT_CLINIC_OR_DEPARTMENT_OTHER): Payer: Medicare Other

## 2014-08-24 ENCOUNTER — Encounter: Payer: Self-pay | Admitting: Internal Medicine

## 2014-08-24 ENCOUNTER — Ambulatory Visit (HOSPITAL_BASED_OUTPATIENT_CLINIC_OR_DEPARTMENT_OTHER): Payer: Medicare Other | Admitting: Internal Medicine

## 2014-08-24 ENCOUNTER — Other Ambulatory Visit: Payer: Medicare Other

## 2014-08-24 ENCOUNTER — Other Ambulatory Visit: Payer: Self-pay | Admitting: Medical Oncology

## 2014-08-24 VITALS — BP 114/66 | HR 71 | Temp 98.0°F | Resp 18 | Ht 62.0 in | Wt 148.1 lb

## 2014-08-24 DIAGNOSIS — C3490 Malignant neoplasm of unspecified part of unspecified bronchus or lung: Secondary | ICD-10-CM

## 2014-08-24 DIAGNOSIS — C3432 Malignant neoplasm of lower lobe, left bronchus or lung: Secondary | ICD-10-CM | POA: Diagnosis not present

## 2014-08-24 DIAGNOSIS — C3431 Malignant neoplasm of lower lobe, right bronchus or lung: Secondary | ICD-10-CM

## 2014-08-24 DIAGNOSIS — Z5111 Encounter for antineoplastic chemotherapy: Secondary | ICD-10-CM

## 2014-08-24 LAB — CBC WITH DIFFERENTIAL/PLATELET
BASO%: 0.9 % (ref 0.0–2.0)
Basophils Absolute: 0 10*3/uL (ref 0.0–0.1)
EOS%: 1.3 % (ref 0.0–7.0)
Eosinophils Absolute: 0 10*3/uL (ref 0.0–0.5)
HCT: 38.6 % (ref 34.8–46.6)
HGB: 12.6 g/dL (ref 11.6–15.9)
LYMPH%: 43.9 % (ref 14.0–49.7)
MCH: 33 pg (ref 25.1–34.0)
MCHC: 32.6 g/dL (ref 31.5–36.0)
MCV: 101.2 fL — ABNORMAL HIGH (ref 79.5–101.0)
MONO#: 0.4 10*3/uL (ref 0.1–0.9)
MONO%: 11.3 % (ref 0.0–14.0)
NEUT#: 1.4 10*3/uL — ABNORMAL LOW (ref 1.5–6.5)
NEUT%: 42.6 % (ref 38.4–76.8)
Platelets: 251 10*3/uL (ref 145–400)
RBC: 3.82 10*6/uL (ref 3.70–5.45)
RDW: 15.5 % — ABNORMAL HIGH (ref 11.2–14.5)
WBC: 3.2 10*3/uL — ABNORMAL LOW (ref 3.9–10.3)
lymph#: 1.4 10*3/uL (ref 0.9–3.3)

## 2014-08-24 LAB — COMPREHENSIVE METABOLIC PANEL (CC13)
ALBUMIN: 3.5 g/dL (ref 3.5–5.0)
ALK PHOS: 81 U/L (ref 40–150)
ALT: 13 U/L (ref 0–55)
AST: 22 U/L (ref 5–34)
Anion Gap: 10 mEq/L (ref 3–11)
BUN: 11.4 mg/dL (ref 7.0–26.0)
CO2: 23 mEq/L (ref 22–29)
CREATININE: 0.8 mg/dL (ref 0.6–1.1)
Calcium: 8.9 mg/dL (ref 8.4–10.4)
Chloride: 109 mEq/L (ref 98–109)
EGFR: 71 mL/min/{1.73_m2} — ABNORMAL LOW (ref >90)
Glucose: 90 mg/dl (ref 70–140)
Potassium: 4 mEq/L (ref 3.5–5.1)
Sodium: 143 mEq/L (ref 136–145)
Total Bilirubin: 0.63 mg/dL (ref 0.20–1.20)
Total Protein: 6.9 g/dL (ref 6.4–8.3)

## 2014-08-24 MED ORDER — SODIUM CHLORIDE 0.9 % IJ SOLN
10.0000 mL | INTRAMUSCULAR | Status: DC | PRN
  Administered 2014-08-24: 10 mL
  Filled 2014-08-24: qty 10

## 2014-08-24 MED ORDER — SODIUM CHLORIDE 0.9 % IV SOLN
500.0000 mg/m2 | Freq: Once | INTRAVENOUS | Status: AC
  Administered 2014-08-24: 850 mg via INTRAVENOUS
  Filled 2014-08-24: qty 34

## 2014-08-24 MED ORDER — SODIUM CHLORIDE 0.9 % IV SOLN
Freq: Once | INTRAVENOUS | Status: AC
  Administered 2014-08-24: 11:00:00 via INTRAVENOUS

## 2014-08-24 MED ORDER — HEPARIN SOD (PORK) LOCK FLUSH 100 UNIT/ML IV SOLN
500.0000 [IU] | Freq: Once | INTRAVENOUS | Status: AC | PRN
  Administered 2014-08-24: 500 [IU]
  Filled 2014-08-24: qty 5

## 2014-08-24 MED ORDER — DEXAMETHASONE SODIUM PHOSPHATE 100 MG/10ML IJ SOLN
Freq: Once | INTRAMUSCULAR | Status: AC
  Administered 2014-08-24: 11:00:00 via INTRAVENOUS
  Filled 2014-08-24: qty 8

## 2014-08-24 NOTE — Progress Notes (Signed)
Per Dr Julien Nordmann it is okay to treat pt today with chemotherapy and today's lab results.

## 2014-08-24 NOTE — Progress Notes (Signed)
[facility] [phone]   Fax:(336) Carmine, MD [place] [address]  Principle Diagnosis and stage: Recurrent non-small cell lung cancer, initially diagnosed a stage IB (T2, N0, M0) adenocarcinoma in June 2008 with a tumor size of 8 cm.   Prior Therapy:  #1 status post left upper lobectomy with lymph node dissection under the care of Dr. Arlyce Dice on 06/29/2006.  #2 status post 4 cycles of adjuvant chemotherapy with cisplatin and Taxotere last dose given 08/01/2006.  #3 status post 6 cycles of systemic chemotherapy with carboplatin and Alimta for disease recurrence last dose given 12/12/2009.   Current therapy: Maintenance chemotherapy with Alimta at 500 mg per meter squared given every 3 weeks status post 64 cycles.  CHEMOTHERAPY INTENT: Palliative/maintenance.  CURRENT # OF CHEMOTHERAPY CYCLES: 65  CURRENT ANTIEMETICS: Zofran, dexamethasone and Compazine  CURRENT SMOKING STATUS: Never Smoker.   ORAL CHEMOTHERAPY AND CONSENT: None  CURRENT BISPHOSPHONATES USE: None  PAIN MANAGEMENT: None  NARCOTICS INDUCED CONSTIPATION: None  LIVING WILL AND CODE STATUS: has living will for no CODE BLUE.  INTERVAL HISTORY: [name] 81 y.o. female returns to the clinic today for follow up visit. The patient is feeling fine today with no specific complaints. She is tolerating her maintenance chemotherapy with single agent Alimta fairly well. The patient denied having any significant nausea or vomiting. She has no fever or chills. The she denied having any significant chest pain, shortness of breath, or hemoptysis. She has no weight loss or night sweats. She is here today to start cycle #65. The patient had repeat CT scan of the chest performed recently and she is here for evaluation and discussion of her scan results.  MEDICAL HISTORY: Past Medical History  Diagnosis Date  . History of migraine  headaches   . Hypercholesterolemia   . lung ca dx'd 06/2006    rt and lt lung  . Lung cancer 3/14/1    right- Adenocarcinoma w/bronchioalveolar features  . FHx: chemotherapy 2008&2011    4 cycles cisplatin,taxotere,2008& carboplatin and Alimta 2011    ALLERGIES:  is allergic to simvastatin and iodinated diagnostic agents.  MEDICATIONS:  Current Outpatient Prescriptions  Medication Sig Dispense Refill  . Calcium Carbonate-Vit D-Min (CALTRATE PLUS PO) Take 500 mg by mouth 2 (two) times daily.      . chlorpheniramine-carbetapentane (TUSSI-12) 4-30 MG/5ML SUSP Take by mouth every 12 (twelve) hours as needed.    Marland Kitchen dexamethasone (DECADRON) 4 MG tablet Take 4 mg by mouth as directed. 1 tablet BID the day before, day of and day after chemo    . ferrous fumarate (HEMOCYTE - 106 MG FE) 325 (106 FE) MG TABS tablet Take 1 tablet by mouth daily.    . folic acid (FOLVITE) 1 MG tablet Take 1 mg by mouth daily.      . Multiple Vitamins-Minerals (CENTRUM SILVER PO) Take 1 tablet by mouth daily.     . potassium gluconate 595 MG TABS Take 595 mg by mouth daily.    . prochlorperazine (COMPAZINE) 10 MG tablet Take 1 tablet (10 mg total) by mouth every 6 (six) hours as needed. 15 tablet 1  . ranitidine (ZANTAC) 75 MG tablet Take 75 mg by mouth. Pt takes as needed    . vitamin B-12 (CYANOCOBALAMIN) 500 MCG tablet Take 1,500 mcg by mouth daily.      No current facility-administered medications for this visit.  REVIEW OF SYSTEMS:  Constitutional: negative Eyes: negative Ears, nose, mouth, throat, and face: negative Respiratory: negative Cardiovascular: negative Gastrointestinal: negative Genitourinary:negative Integument/breast: negative Hematologic/lymphatic: negative Musculoskeletal:negative Neurological: negative Behavioral/Psych: negative Endocrine: negative Allergic/Immunologic: negative   PHYSICAL EXAMINATION: General appearance: alert, cooperative and no distress Head: Normocephalic,  without obvious abnormality, atraumatic Neck: no adenopathy Lymph nodes: Cervical, supraclavicular, and axillary nodes normal. Resp: clear to auscultation bilaterally Back: symmetric, no curvature. ROM normal. No CVA tenderness. Cardio: regular rate and rhythm, S1, S2 normal, no murmur, click, rub or gallop GI: soft, non-tender; bowel sounds normal; no masses,  no organomegaly Extremities: extremities normal, atraumatic, no cyanosis or edema Neurologic: Alert and oriented X 3, normal strength and tone. Normal symmetric reflexes. Normal coordination and gait  ECOG PERFORMANCE STATUS: 0 - Asymptomatic  Blood pressure 114/66, pulse 71, temperature 98 F (36.7 C), temperature source Oral, resp. rate 18, height 5\' 2"  (1.575 m), weight 148 lb 1.6 oz (67.178 kg), SpO2 100 %.  LABORATORY DATA: Lab Results  Component Value Date   WBC 3.2* 08/24/2014   HGB 12.6 08/24/2014   HCT 38.6 08/24/2014   MCV 101.2* 08/24/2014   PLT 251 08/24/2014      Chemistry      Component Value Date/Time   NA 144 08/17/2014 1252   NA 139 01/24/2012 1008   NA 140 08/21/2011 1306   K 3.9 08/17/2014 1252   K 4.2 01/24/2012 1008   K 4.1 08/21/2011 1306   CL 108* 11/04/2012 0947   CL 103 01/24/2012 1008   CL 107 08/21/2011 1306   CO2 24 08/17/2014 1252   CO2 28 01/24/2012 1008   CO2 22 08/21/2011 1306   BUN 12.4 08/17/2014 1252   BUN 13 01/24/2012 1008   BUN 11 08/21/2011 1306   CREATININE 0.8 08/17/2014 1252   CREATININE 0.8 01/24/2012 1008   CREATININE 0.81 08/21/2011 1306      Component Value Date/Time   CALCIUM 9.0 08/17/2014 1252   CALCIUM 8.6 01/24/2012 1008   CALCIUM 8.8 08/21/2011 1306   ALKPHOS 77 08/17/2014 1252   ALKPHOS 79 01/24/2012 1008   ALKPHOS 87 08/21/2011 1306   AST 23 08/17/2014 1252   AST 24 01/24/2012 1008   AST 17 08/21/2011 1306   ALT 14 08/17/2014 1252   ALT 21 01/24/2012 1008   ALT 8 08/21/2011 1306   BILITOT 0.56 08/17/2014 1252   BILITOT 0.90 01/24/2012 1008    BILITOT 0.6 08/21/2011 1306       RADIOGRAPHIC STUDIES: Ct Chest Wo Contrast  08/17/2014   CLINICAL DATA:  Followup lung cancer  EXAM: CT CHEST WITHOUT CONTRAST  TECHNIQUE: Multidetector CT imaging of the chest was performed following the standard protocol without IV contrast.  COMPARISON:  05/30/2014  FINDINGS: Mediastinum: The heart size is normal. There is no pericardial effusion identified. Calcified atherosclerotic disease involves the thoracic aorta as well as the LAD and RCA Coronary artery. The trachea is patent and midline. Normal appearance of the esophagus. No mediastinal or hilar adenopathy identified.  Lungs/Pleura: No pleural effusion identified. Right upper lobe lung mass measures 3.6 x 3.6 x 2.1 cm. Previously this measured 3.4 x 3.4 x 2.2 cm. Surrounding changes of external beam radiation identified within the right upper lobe. Sub solid, subpleural nodule within the anterior left upper lobe measures 1.1 cm, image 31/series 5. Unchanged from previous exam. Right middle lobe subpleural lesion is stable measuring 1.3 cm, image 38/ series 5.  Upper Abdomen: There is no focal liver abnormality.  The visualized portions of the spleen are normal. Normal appearance of the adrenal glands.  Musculoskeletal: No aggressive lytic or sclerotic bone lesions identified.  IMPRESSION: 1. There is been mild increase in size of right upper lobe lung mass. 2. Subpleural nodules in the left upper lobe and right middle lobe are stable. 3. Atherosclerotic disease including to vessel Coronary artery calcifications.   Electronically Signed   By: Kerby Moors M.D.   On: 08/17/2014 15:31   ASSESSMENT AND PLAN: This is a very pleasant 81 years old white female with recurrent non-small cell lung cancer, adenocarcinoma. The patient is currently undergoing maintenance chemotherapy with single agent Alimta status post 64 cycles and she is tolerating it fairly well.  The recent CT scan of the chest showed no evidence for  disease progression except for few millimeter increase in the size of the right upper lobe lung mass. I discussed the scan results with the patient and gave her a copy of the report. I recommended for the patient to proceed with her maintenance chemotherapy with single agent Alimta as scheduled. We'll proceed today with cycle #65 as scheduled.  She will come back for follow-up visit in 3 weeks with the next cycle of her treatment. She was advised to call immediately if she has any concerning symptoms in the interval.  Disclaimer: This note was dictated with voice recognition software. Similar sounding words can inadvertently be transcribed and may not be corrected upon review.  [name]., MD 08/24/2014

## 2014-08-24 NOTE — Progress Notes (Signed)
VO given and read back to treat patient today with alimta despite ANC of 1.4 - Dr. Julien Nordmann.

## 2014-08-24 NOTE — Telephone Encounter (Signed)
Pt confirmed labs/ov per 03/23 POF, gave pt AVS and Calendar.... KJ

## 2014-08-24 NOTE — Patient Instructions (Signed)
[facility] Discharge Instructions for Patients Receiving Chemotherapy  Today you received the following chemotherapy agent: Alimta  To help prevent nausea and vomiting after your treatment, we encourage you to take your nausea medication as prescribed.    If you develop nausea and vomiting that is not controlled by your nausea medication, call the clinic.   BELOW ARE SYMPTOMS THAT SHOULD BE REPORTED IMMEDIATELY:  *FEVER GREATER THAN 100.5 F  *CHILLS WITH OR WITHOUT FEVER  NAUSEA AND VOMITING THAT IS NOT CONTROLLED WITH YOUR NAUSEA MEDICATION  *UNUSUAL SHORTNESS OF BREATH  *UNUSUAL BRUISING OR BLEEDING  TENDERNESS IN MOUTH AND THROAT WITH OR WITHOUT PRESENCE OF ULCERS  *URINARY PROBLEMS  *BOWEL PROBLEMS  UNUSUAL RASH Items with * indicate a potential emergency and should be followed up as soon as possible.  Feel free to call the clinic you have any questions or concerns. The clinic phone number is [phone].

## 2014-09-14 ENCOUNTER — Encounter: Payer: Self-pay | Admitting: Physician Assistant

## 2014-09-14 ENCOUNTER — Telehealth: Payer: Self-pay | Admitting: Physician Assistant

## 2014-09-14 ENCOUNTER — Ambulatory Visit (HOSPITAL_BASED_OUTPATIENT_CLINIC_OR_DEPARTMENT_OTHER): Payer: Medicare Other | Admitting: Physician Assistant

## 2014-09-14 ENCOUNTER — Telehealth: Payer: Self-pay | Admitting: *Deleted

## 2014-09-14 ENCOUNTER — Ambulatory Visit (HOSPITAL_BASED_OUTPATIENT_CLINIC_OR_DEPARTMENT_OTHER): Payer: Medicare Other

## 2014-09-14 ENCOUNTER — Other Ambulatory Visit (HOSPITAL_BASED_OUTPATIENT_CLINIC_OR_DEPARTMENT_OTHER): Payer: Medicare Other

## 2014-09-14 VITALS — BP 128/57 | HR 64 | Temp 98.3°F | Resp 18 | Ht 62.0 in | Wt 148.0 lb

## 2014-09-14 DIAGNOSIS — Z5111 Encounter for antineoplastic chemotherapy: Secondary | ICD-10-CM | POA: Diagnosis not present

## 2014-09-14 DIAGNOSIS — C3432 Malignant neoplasm of lower lobe, left bronchus or lung: Secondary | ICD-10-CM | POA: Diagnosis not present

## 2014-09-14 DIAGNOSIS — C3431 Malignant neoplasm of lower lobe, right bronchus or lung: Secondary | ICD-10-CM

## 2014-09-14 DIAGNOSIS — C3490 Malignant neoplasm of unspecified part of unspecified bronchus or lung: Secondary | ICD-10-CM

## 2014-09-14 LAB — COMPREHENSIVE METABOLIC PANEL (CC13)
ALK PHOS: 83 U/L (ref 40–150)
ALT: 10 U/L (ref 0–55)
AST: 22 U/L (ref 5–34)
Albumin: 3.3 g/dL — ABNORMAL LOW (ref 3.5–5.0)
Anion Gap: 10 mEq/L (ref 3–11)
BILIRUBIN TOTAL: 0.56 mg/dL (ref 0.20–1.20)
BUN: 15.5 mg/dL (ref 7.0–26.0)
CHLORIDE: 109 meq/L (ref 98–109)
CO2: 24 mEq/L (ref 22–29)
Calcium: 8.9 mg/dL (ref 8.4–10.4)
Creatinine: 0.8 mg/dL (ref 0.6–1.1)
EGFR: 73 mL/min/{1.73_m2} — ABNORMAL LOW (ref >90)
Glucose: 85 mg/dl (ref 70–140)
POTASSIUM: 4.4 meq/L (ref 3.5–5.1)
SODIUM: 143 meq/L (ref 136–145)
Total Protein: 6.7 g/dL (ref 6.4–8.3)

## 2014-09-14 LAB — CBC WITH DIFFERENTIAL/PLATELET
BASO%: 0.8 % (ref 0.0–2.0)
BASOS ABS: 0 10*3/uL (ref 0.0–0.1)
EOS ABS: 0 10*3/uL (ref 0.0–0.5)
EOS%: 1.1 % (ref 0.0–7.0)
HCT: 36.5 % (ref 34.8–46.6)
HEMOGLOBIN: 11.9 g/dL (ref 11.6–15.9)
LYMPH%: 33.7 % (ref 14.0–49.7)
MCH: 32.9 pg (ref 25.1–34.0)
MCHC: 32.6 g/dL (ref 31.5–36.0)
MCV: 100.9 fL (ref 79.5–101.0)
MONO#: 0.4 10*3/uL (ref 0.1–0.9)
MONO%: 13.9 % (ref 0.0–14.0)
NEUT#: 1.5 10*3/uL (ref 1.5–6.5)
NEUT%: 50.5 % (ref 38.4–76.8)
PLATELETS: 242 10*3/uL (ref 145–400)
RBC: 3.61 10*6/uL — ABNORMAL LOW (ref 3.70–5.45)
RDW: 14.9 % — ABNORMAL HIGH (ref 11.2–14.5)
WBC: 3 10*3/uL — ABNORMAL LOW (ref 3.9–10.3)
lymph#: 1 10*3/uL (ref 0.9–3.3)

## 2014-09-14 MED ORDER — SODIUM CHLORIDE 0.9 % IV SOLN
500.0000 mg/m2 | Freq: Once | INTRAVENOUS | Status: AC
  Administered 2014-09-14: 850 mg via INTRAVENOUS
  Filled 2014-09-14: qty 34

## 2014-09-14 MED ORDER — SODIUM CHLORIDE 0.9 % IJ SOLN
10.0000 mL | INTRAMUSCULAR | Status: DC | PRN
  Filled 2014-09-14: qty 10

## 2014-09-14 MED ORDER — HEPARIN SOD (PORK) LOCK FLUSH 100 UNIT/ML IV SOLN
500.0000 [IU] | Freq: Once | INTRAVENOUS | Status: DC | PRN
  Filled 2014-09-14: qty 5

## 2014-09-14 MED ORDER — SODIUM CHLORIDE 0.9 % IV SOLN
Freq: Once | INTRAVENOUS | Status: AC
  Administered 2014-09-14: 12:00:00 via INTRAVENOUS

## 2014-09-14 MED ORDER — SODIUM CHLORIDE 0.9 % IV SOLN
Freq: Once | INTRAVENOUS | Status: AC
  Administered 2014-09-14: 12:00:00 via INTRAVENOUS
  Filled 2014-09-14: qty 8

## 2014-09-14 NOTE — Telephone Encounter (Signed)
Per staff message and POF I have scheduled appts. Advised scheduler of appts. JMW  

## 2014-09-14 NOTE — Telephone Encounter (Signed)
Pt confirmed labs/ov per 04/13 POF, gave pt AVS and Calendar..... KJ, sent msg to add chemo

## 2014-09-14 NOTE — Patient Instructions (Signed)
[facility] Discharge Instructions for Patients Receiving Chemotherapy  Today you received the following chemotherapy agents Alimta  To help prevent nausea and vomiting after your treatment, we encourage you to take your nausea medication    If you develop nausea and vomiting that is not controlled by your nausea medication, call the clinic.   BELOW ARE SYMPTOMS THAT SHOULD BE REPORTED IMMEDIATELY:  *FEVER GREATER THAN 100.5 F  *CHILLS WITH OR WITHOUT FEVER  NAUSEA AND VOMITING THAT IS NOT CONTROLLED WITH YOUR NAUSEA MEDICATION  *UNUSUAL SHORTNESS OF BREATH  *UNUSUAL BRUISING OR BLEEDING  TENDERNESS IN MOUTH AND THROAT WITH OR WITHOUT PRESENCE OF ULCERS  *URINARY PROBLEMS  *BOWEL PROBLEMS  UNUSUAL RASH Items with * indicate a potential emergency and should be followed up as soon as possible.  Feel free to call the clinic you have any questions or concerns. The clinic phone number is [phone].  Please show the [facility] at check-in to the Emergency Department and triage nurse.

## 2014-09-14 NOTE — Progress Notes (Addendum)
[facility] [phone]   Fax:(336) [place], MD [place] [address]  Principle Diagnosis and stage: Recurrent non-small cell lung cancer, initially diagnosed a stage IB (T2, N0, M0) adenocarcinoma in June 2008 with a tumor size of 8 cm.   Prior Therapy:  #1 status post left upper lobectomy with lymph node dissection under the care of Dr. Arlyce Dice on 06/29/2006.  #2 status post 4 cycles of adjuvant chemotherapy with cisplatin and Taxotere last dose given 08/01/2006.  #3 status post 6 cycles of systemic chemotherapy with carboplatin and Alimta for disease recurrence last dose given 12/12/2009.   Current therapy: Maintenance chemotherapy with Alimta at 500 mg per meter squared given every 3 weeks status post 65 cycles.  CHEMOTHERAPY INTENT: Palliative/maintenance.  CURRENT # OF CHEMOTHERAPY CYCLES: 66  CURRENT ANTIEMETICS: Zofran, dexamethasone and Compazine  CURRENT SMOKING STATUS: Never Smoker.   ORAL CHEMOTHERAPY AND CONSENT: None  CURRENT BISPHOSPHONATES USE: None  PAIN MANAGEMENT: None  NARCOTICS INDUCED CONSTIPATION: None  LIVING WILL AND CODE STATUS: has living will for no CODE BLUE.  INTERVAL HISTORY: [name] 81 y.o. female returns to the clinic today for follow up visit. The patient is feeling fine today with no specific complaints. She is tolerating her maintenance chemotherapy with single agent Alimta fairly well. The patient denied having any significant nausea or vomiting. She has no fever or chills. The she denied having any significant chest pain, shortness of breath, or hemoptysis. She has no weight loss or night sweats. She is here today to start cycle #66. She requests that her next cycle that is due 10/26/1998 6:15 be rescheduled to 11/02/2014 due to his beach trip that she is planned for 10/28/2014. She wants to feel well while she is away at the beach.  MEDICAL  HISTORY: Past Medical History  Diagnosis Date  . History of migraine headaches   . Hypercholesterolemia   . lung ca dx'd 06/2006    rt and lt lung  . Lung cancer 3/14/1    right- Adenocarcinoma w/bronchioalveolar features  . FHx: chemotherapy 2008&2011    4 cycles cisplatin,taxotere,2008& carboplatin and Alimta 2011    ALLERGIES:  is allergic to simvastatin and iodinated diagnostic agents.  MEDICATIONS:  Current Outpatient Prescriptions  Medication Sig Dispense Refill  . Calcium Carbonate-Vit D-Min (CALTRATE PLUS PO) Take 500 mg by mouth 2 (two) times daily.      . chlorpheniramine-carbetapentane (TUSSI-12) 4-30 MG/5ML SUSP Take by mouth every 12 (twelve) hours as needed.    Marland Kitchen dexamethasone (DECADRON) 4 MG tablet Take 4 mg by mouth as directed. 1 tablet BID the day before, day of and day after chemo    . ferrous fumarate (HEMOCYTE - 106 MG FE) 325 (106 FE) MG TABS tablet Take 1 tablet by mouth daily.    . folic acid (FOLVITE) 1 MG tablet Take 1 mg by mouth daily.      . Multiple Vitamins-Minerals (CENTRUM SILVER PO) Take 1 tablet by mouth daily.     . potassium gluconate 595 MG TABS Take 595 mg by mouth daily.    . prochlorperazine (COMPAZINE) 10 MG tablet Take 1 tablet (10 mg total) by mouth every 6 (six) hours as needed. (Patient not taking: Reported on 09/14/2014) 15 tablet 1  . ranitidine (ZANTAC) 75 MG tablet Take 75 mg by mouth. Pt takes as needed    . vitamin B-12 (CYANOCOBALAMIN) 500 MCG tablet  Take 1,500 mcg by mouth daily.      No current facility-administered medications for this visit.    REVIEW OF SYSTEMS:  Constitutional: negative Eyes: negative Ears, nose, mouth, throat, and face: negative Respiratory: negative Cardiovascular: negative Gastrointestinal: negative Genitourinary:negative Integument/breast: negative Hematologic/lymphatic: negative Musculoskeletal:negative Neurological: negative Behavioral/Psych: negative Endocrine: negative Allergic/Immunologic:  negative   PHYSICAL EXAMINATION: General appearance: alert, cooperative and no distress Head: Normocephalic, without obvious abnormality, atraumatic Neck: no adenopathy Lymph nodes: Cervical, supraclavicular, and axillary nodes normal. Resp: clear to auscultation bilaterally Back: symmetric, no curvature. ROM normal. No CVA tenderness. Cardio: regular rate and rhythm, S1, S2 normal, no murmur, click, rub or gallop GI: soft, non-tender; bowel sounds normal; no masses,  no organomegaly Extremities: extremities normal, atraumatic, no cyanosis or edema Neurologic: Alert and oriented X 3, normal strength and tone. Normal symmetric reflexes. Normal coordination and gait  ECOG PERFORMANCE STATUS: 0 - Asymptomatic  Blood pressure 128/57, pulse 64, temperature 98.3 F (36.8 C), temperature source Oral, resp. rate 18, height 5\' 2"  (1.575 m), weight 148 lb (67.132 kg), SpO2 100 %.  LABORATORY DATA: Lab Results  Component Value Date   WBC 3.0* 09/14/2014   HGB 11.9 09/14/2014   HCT 36.5 09/14/2014   MCV 100.9 09/14/2014   PLT 242 09/14/2014      Chemistry      Component Value Date/Time   NA 143 09/14/2014 0939   NA 139 01/24/2012 1008   NA 140 08/21/2011 1306   K 4.4 09/14/2014 0939   K 4.2 01/24/2012 1008   K 4.1 08/21/2011 1306   CL 108* 11/04/2012 0947   CL 103 01/24/2012 1008   CL 107 08/21/2011 1306   CO2 24 09/14/2014 0939   CO2 28 01/24/2012 1008   CO2 22 08/21/2011 1306   BUN 15.5 09/14/2014 0939   BUN 13 01/24/2012 1008   BUN 11 08/21/2011 1306   CREATININE 0.8 09/14/2014 0939   CREATININE 0.8 01/24/2012 1008   CREATININE 0.81 08/21/2011 1306      Component Value Date/Time   CALCIUM 8.9 09/14/2014 0939   CALCIUM 8.6 01/24/2012 1008   CALCIUM 8.8 08/21/2011 1306   ALKPHOS 83 09/14/2014 0939   ALKPHOS 79 01/24/2012 1008   ALKPHOS 87 08/21/2011 1306   AST 22 09/14/2014 0939   AST 24 01/24/2012 1008   AST 17 08/21/2011 1306   ALT 10 09/14/2014 0939   ALT 21  01/24/2012 1008   ALT 8 08/21/2011 1306   BILITOT 0.56 09/14/2014 0939   BILITOT 0.90 01/24/2012 1008   BILITOT 0.6 08/21/2011 1306       RADIOGRAPHIC STUDIES: Ct Chest Wo Contrast  08/17/2014   CLINICAL DATA:  Followup lung cancer  EXAM: CT CHEST WITHOUT CONTRAST  TECHNIQUE: Multidetector CT imaging of the chest was performed following the standard protocol without IV contrast.  COMPARISON:  05/30/2014  FINDINGS: Mediastinum: The heart size is normal. There is no pericardial effusion identified. Calcified atherosclerotic disease involves the thoracic aorta as well as the LAD and RCA Coronary artery. The trachea is patent and midline. Normal appearance of the esophagus. No mediastinal or hilar adenopathy identified.  Lungs/Pleura: No pleural effusion identified. Right upper lobe lung mass measures 3.6 x 3.6 x 2.1 cm. Previously this measured 3.4 x 3.4 x 2.2 cm. Surrounding changes of external beam radiation identified within the right upper lobe. Sub solid, subpleural nodule within the anterior left upper lobe measures 1.1 cm, image 31/series 5. Unchanged from previous exam. Right middle lobe subpleural  lesion is stable measuring 1.3 cm, image 38/ series 5.  Upper Abdomen: There is no focal liver abnormality. The visualized portions of the spleen are normal. Normal appearance of the adrenal glands.  Musculoskeletal: No aggressive lytic or sclerotic bone lesions identified.  IMPRESSION: 1. There is been mild increase in size of right upper lobe lung mass. 2. Subpleural nodules in the left upper lobe and right middle lobe are stable. 3. Atherosclerotic disease including to vessel Coronary artery calcifications.   Electronically Signed   By: Kerby Moors M.D.   On: 08/17/2014 15:31   ASSESSMENT AND PLAN: This is a very pleasant 81 years old white female with recurrent non-small cell lung cancer, adenocarcinoma. The patient is currently undergoing maintenance chemotherapy with single agent Alimta status  post 64 cycles and she is tolerating it fairly well.  The recent CT scan of the chest showed no evidence for disease progression except for few millimeter increase in the size of the right upper lobe lung mass. Patient was discussed with and also seen by Dr. Julien Nordmann. She will continue with her treatment with single agent Alimta. We'll proceed today with cycle #66 as scheduled.  She will return in 3 weeks prior to start of cycle #67. We will adjust cycle #68 and she requests to accommodate her trip to the beach and I will be scheduled for 11/02/2014. come back for follow-up visit in 3 weeks with the next cycle of her treatment. She was advised to call immediately if she has any concerning symptoms in the interval.  Disclaimer: This note was dictated with voice recognition software. Similar sounding words can inadvertently be transcribed and may not be corrected upon review.  Carlton Adam, [name] 09/14/2014     ADDENDUM: Hematology/Oncology Attending: I had a face to face encounter with the patient. I recommended her care plan. This is a very pleasant 81 years old white female with recurrent non-small cell lung cancer, adenocarcinoma. She is currently undergoing maintenance chemotherapy with single agent Alimta status post 65 cycles and tolerating her treatment fairly well with no significant adverse effects. I recommended for the patient to continue her current treatment with Alimta as a scheduled. She will receive cycle #66 today. The patient would come back for follow-up visit in 3 weeks for reevaluation before starting cycle #67. She was advised to call immediately if she has any concerning symptoms in the interval.  Disclaimer: This note was dictated with voice recognition software. Similar sounding words can inadvertently be transcribed and may not be corrected upon review. [name]., MD 09/18/2014

## 2014-09-16 NOTE — Patient Instructions (Signed)
Follow-up in 3 weeks

## 2014-10-05 ENCOUNTER — Other Ambulatory Visit: Payer: Self-pay | Admitting: Orthopedic Surgery

## 2014-10-05 ENCOUNTER — Telehealth: Payer: Self-pay | Admitting: Internal Medicine

## 2014-10-05 ENCOUNTER — Encounter: Payer: Self-pay | Admitting: Internal Medicine

## 2014-10-05 ENCOUNTER — Ambulatory Visit (HOSPITAL_BASED_OUTPATIENT_CLINIC_OR_DEPARTMENT_OTHER): Payer: Medicare Other

## 2014-10-05 ENCOUNTER — Other Ambulatory Visit (HOSPITAL_BASED_OUTPATIENT_CLINIC_OR_DEPARTMENT_OTHER): Payer: Medicare Other

## 2014-10-05 ENCOUNTER — Ambulatory Visit (HOSPITAL_BASED_OUTPATIENT_CLINIC_OR_DEPARTMENT_OTHER): Payer: Medicare Other | Admitting: Internal Medicine

## 2014-10-05 VITALS — BP 142/68 | HR 61 | Temp 98.1°F | Resp 18 | Ht 62.0 in | Wt 148.4 lb

## 2014-10-05 DIAGNOSIS — C3490 Malignant neoplasm of unspecified part of unspecified bronchus or lung: Secondary | ICD-10-CM

## 2014-10-05 DIAGNOSIS — C3432 Malignant neoplasm of lower lobe, left bronchus or lung: Secondary | ICD-10-CM | POA: Diagnosis not present

## 2014-10-05 DIAGNOSIS — Z5111 Encounter for antineoplastic chemotherapy: Secondary | ICD-10-CM

## 2014-10-05 DIAGNOSIS — C3431 Malignant neoplasm of lower lobe, right bronchus or lung: Secondary | ICD-10-CM

## 2014-10-05 DIAGNOSIS — R52 Pain, unspecified: Secondary | ICD-10-CM

## 2014-10-05 LAB — COMPREHENSIVE METABOLIC PANEL (CC13)
ALT: 12 U/L (ref 0–55)
ANION GAP: 8 meq/L (ref 3–11)
AST: 21 U/L (ref 5–34)
Albumin: 3.2 g/dL — ABNORMAL LOW (ref 3.5–5.0)
Alkaline Phosphatase: 78 U/L (ref 40–150)
BUN: 10.7 mg/dL (ref 7.0–26.0)
CO2: 24 mEq/L (ref 22–29)
CREATININE: 0.8 mg/dL (ref 0.6–1.1)
Calcium: 8.7 mg/dL (ref 8.4–10.4)
Chloride: 109 mEq/L (ref 98–109)
EGFR: 71 mL/min/{1.73_m2} — ABNORMAL LOW (ref >90)
Glucose: 74 mg/dl (ref 70–140)
Potassium: 3.7 mEq/L (ref 3.5–5.1)
Sodium: 141 mEq/L (ref 136–145)
Total Bilirubin: 0.53 mg/dL (ref 0.20–1.20)
Total Protein: 6.8 g/dL (ref 6.4–8.3)

## 2014-10-05 LAB — CBC WITH DIFFERENTIAL/PLATELET
BASO%: 0.8 % (ref 0.0–2.0)
Basophils Absolute: 0 10*3/uL (ref 0.0–0.1)
EOS%: 1.6 % (ref 0.0–7.0)
Eosinophils Absolute: 0.1 10*3/uL (ref 0.0–0.5)
HEMATOCRIT: 36.8 % (ref 34.8–46.6)
HGB: 12.1 g/dL (ref 11.6–15.9)
LYMPH%: 46.6 % (ref 14.0–49.7)
MCH: 32.9 pg (ref 25.1–34.0)
MCHC: 33 g/dL (ref 31.5–36.0)
MCV: 99.6 fL (ref 79.5–101.0)
MONO#: 0.4 10*3/uL (ref 0.1–0.9)
MONO%: 12.6 % (ref 0.0–14.0)
NEUT#: 1.3 10*3/uL — ABNORMAL LOW (ref 1.5–6.5)
NEUT%: 38.4 % (ref 38.4–76.8)
PLATELETS: 326 10*3/uL (ref 145–400)
RBC: 3.7 10*6/uL (ref 3.70–5.45)
RDW: 14.1 % (ref 11.2–14.5)
WBC: 3.4 10*3/uL — ABNORMAL LOW (ref 3.9–10.3)
lymph#: 1.6 10*3/uL (ref 0.9–3.3)

## 2014-10-05 MED ORDER — CYANOCOBALAMIN 1000 MCG/ML IJ SOLN
1000.0000 ug | Freq: Once | INTRAMUSCULAR | Status: AC
  Administered 2014-10-05: 1000 ug via INTRAMUSCULAR

## 2014-10-05 MED ORDER — DEXAMETHASONE SODIUM PHOSPHATE 100 MG/10ML IJ SOLN
Freq: Once | INTRAMUSCULAR | Status: AC
  Administered 2014-10-05: 10:00:00 via INTRAVENOUS
  Filled 2014-10-05: qty 8

## 2014-10-05 MED ORDER — SODIUM CHLORIDE 0.9 % IV SOLN
Freq: Once | INTRAVENOUS | Status: AC
  Administered 2014-10-05: 10:00:00 via INTRAVENOUS

## 2014-10-05 MED ORDER — CYANOCOBALAMIN 1000 MCG/ML IJ SOLN
INTRAMUSCULAR | Status: AC
  Filled 2014-10-05: qty 1

## 2014-10-05 MED ORDER — SODIUM CHLORIDE 0.9 % IV SOLN
500.0000 mg/m2 | Freq: Once | INTRAVENOUS | Status: AC
  Administered 2014-10-05: 850 mg via INTRAVENOUS
  Filled 2014-10-05: qty 34

## 2014-10-05 MED ORDER — HEPARIN SOD (PORK) LOCK FLUSH 100 UNIT/ML IV SOLN
500.0000 [IU] | Freq: Once | INTRAVENOUS | Status: AC | PRN
  Administered 2014-10-05: 500 [IU]
  Filled 2014-10-05: qty 5

## 2014-10-05 MED ORDER — SODIUM CHLORIDE 0.9 % IJ SOLN
10.0000 mL | INTRAMUSCULAR | Status: DC | PRN
  Administered 2014-10-05: 10 mL
  Filled 2014-10-05: qty 10

## 2014-10-05 NOTE — Telephone Encounter (Signed)
per pof to sch pt appt-per pof to sch 6/2-adv pt [facility] will be calling to sch pt scan

## 2014-10-05 NOTE — Progress Notes (Signed)
[facility] [phone]   Fax:(336) [place], MD [place] [address]  Principle Diagnosis and stage: Recurrent non-small cell lung cancer, initially diagnosed a stage IB (T2, N0, M0) adenocarcinoma in June 2008 with a tumor size of 8 cm.   Prior Therapy:  #1 status post left upper lobectomy with lymph node dissection under the care of Dr. Arlyce Dice on 06/29/2006.  #2 status post 4 cycles of adjuvant chemotherapy with cisplatin and Taxotere last dose given 08/01/2006.  #3 status post 6 cycles of systemic chemotherapy with carboplatin and Alimta for disease recurrence last dose given 12/12/2009.   Current therapy: Maintenance chemotherapy with Alimta at 500 mg per meter squared given every 3 weeks status post 66 cycles.  CHEMOTHERAPY INTENT: Palliative/maintenance.  CURRENT # OF CHEMOTHERAPY CYCLES: 67  CURRENT ANTIEMETICS: Zofran, dexamethasone and Compazine  CURRENT SMOKING STATUS: Never Smoker.   ORAL CHEMOTHERAPY AND CONSENT: None  CURRENT BISPHOSPHONATES USE: None  PAIN MANAGEMENT: None  NARCOTICS INDUCED CONSTIPATION: None  LIVING WILL AND CODE STATUS: has living will for no CODE BLUE.  INTERVAL HISTORY: [name] 81 y.o. female returns to the clinic today for follow up visit. The patient has no complaints today. She is tolerating her maintenance chemotherapy with single agent Alimta fairly well. The patient denied having any significant nausea or vomiting. She has no fever or chills. The she denied having any significant chest pain, shortness of breath, or hemoptysis. She has no weight loss or night sweats. She is here today to start cycle #67.   MEDICAL HISTORY: Past Medical History  Diagnosis Date  . History of migraine headaches   . Hypercholesterolemia   . lung ca dx'd 06/2006    rt and lt lung  . Lung cancer 3/14/1    right- Adenocarcinoma w/bronchioalveolar  features  . FHx: chemotherapy 2008&2011    4 cycles cisplatin,taxotere,2008& carboplatin and Alimta 2011    ALLERGIES:  is allergic to simvastatin and iodinated diagnostic agents.  MEDICATIONS:  Current Outpatient Prescriptions  Medication Sig Dispense Refill  . Calcium Carbonate-Vit D-Min (CALTRATE PLUS PO) Take 500 mg by mouth 2 (two) times daily.      . chlorpheniramine-carbetapentane (TUSSI-12) 4-30 MG/5ML SUSP Take by mouth every 12 (twelve) hours as needed.    Marland Kitchen dexamethasone (DECADRON) 4 MG tablet Take 4 mg by mouth as directed. 1 tablet BID the day before, day of and day after chemo    . ferrous fumarate (HEMOCYTE - 106 MG FE) 325 (106 FE) MG TABS tablet Take 1 tablet by mouth daily.    . folic acid (FOLVITE) 1 MG tablet Take 1 mg by mouth daily.      . Multiple Vitamins-Minerals (CENTRUM SILVER PO) Take 1 tablet by mouth daily.     . potassium gluconate 595 MG TABS Take 595 mg by mouth daily.    . prochlorperazine (COMPAZINE) 10 MG tablet Take 1 tablet (10 mg total) by mouth every 6 (six) hours as needed. (Patient not taking: Reported on 09/14/2014) 15 tablet 1  . ranitidine (ZANTAC) 75 MG tablet Take 75 mg by mouth. Pt takes as needed    . vitamin B-12 (CYANOCOBALAMIN) 500 MCG tablet Take 1,500 mcg by mouth daily.      No current facility-administered medications for this visit.    REVIEW OF SYSTEMS:  Constitutional: negative Eyes: negative Ears, nose, mouth, throat, and face: negative Respiratory: negative Cardiovascular: negative Gastrointestinal:  negative Genitourinary:negative Integument/breast: negative Hematologic/lymphatic: negative Musculoskeletal:negative Neurological: negative Behavioral/Psych: negative Endocrine: negative Allergic/Immunologic: negative   PHYSICAL EXAMINATION: General appearance: alert, cooperative and no distress Head: Normocephalic, without obvious abnormality, atraumatic Neck: no adenopathy Lymph nodes: Cervical, supraclavicular, and  axillary nodes normal. Resp: clear to auscultation bilaterally Back: symmetric, no curvature. ROM normal. No CVA tenderness. Cardio: regular rate and rhythm, S1, S2 normal, no murmur, click, rub or gallop GI: soft, non-tender; bowel sounds normal; no masses,  no organomegaly Extremities: extremities normal, atraumatic, no cyanosis or edema Neurologic: Alert and oriented X 3, normal strength and tone. Normal symmetric reflexes. Normal coordination and gait  ECOG PERFORMANCE STATUS: 0 - Asymptomatic  Blood pressure 142/68, pulse 61, temperature 98.1 F (36.7 C), temperature source Oral, resp. rate 18, height '5\' 2"'$  (1.575 m), weight 148 lb 6.4 oz (67.314 kg), SpO2 100 %.  LABORATORY DATA: Lab Results  Component Value Date   WBC 3.4* 10/05/2014   HGB 12.1 10/05/2014   HCT 36.8 10/05/2014   MCV 99.6 10/05/2014   PLT 326 10/05/2014      Chemistry      Component Value Date/Time   NA 143 09/14/2014 0939   NA 139 01/24/2012 1008   NA 140 08/21/2011 1306   K 4.4 09/14/2014 0939   K 4.2 01/24/2012 1008   K 4.1 08/21/2011 1306   CL 108* 11/04/2012 0947   CL 103 01/24/2012 1008   CL 107 08/21/2011 1306   CO2 24 09/14/2014 0939   CO2 28 01/24/2012 1008   CO2 22 08/21/2011 1306   BUN 15.5 09/14/2014 0939   BUN 13 01/24/2012 1008   BUN 11 08/21/2011 1306   CREATININE 0.8 09/14/2014 0939   CREATININE 0.8 01/24/2012 1008   CREATININE 0.81 08/21/2011 1306      Component Value Date/Time   CALCIUM 8.9 09/14/2014 0939   CALCIUM 8.6 01/24/2012 1008   CALCIUM 8.8 08/21/2011 1306   ALKPHOS 83 09/14/2014 0939   ALKPHOS 79 01/24/2012 1008   ALKPHOS 87 08/21/2011 1306   AST 22 09/14/2014 0939   AST 24 01/24/2012 1008   AST 17 08/21/2011 1306   ALT 10 09/14/2014 0939   ALT 21 01/24/2012 1008   ALT 8 08/21/2011 1306   BILITOT 0.56 09/14/2014 0939   BILITOT 0.90 01/24/2012 1008   BILITOT 0.6 08/21/2011 1306       RADIOGRAPHIC STUDIES: No results found. ASSESSMENT AND PLAN: This is  a very pleasant 81 years old white female with recurrent non-small cell lung cancer, adenocarcinoma. The patient is currently undergoing maintenance chemotherapy with single agent Alimta status post 66 cycles and she is tolerating it fairly well.  I recommended for the patient to proceed with her maintenance chemotherapy with single agent Alimta as scheduled. We'll proceed today with cycle #67 as scheduled.  She is planning a vacation to the beach 3 weeks from now and she would like to delay her next cycle of the chemotherapy by 1 week. She will come back for follow-up visit in 4 weeks with the next cycle of her treatment after repeating CT scan of the chest without contrast for restaging of her disease. She was advised to call immediately if she has any concerning symptoms in the interval.  Disclaimer: This note was dictated with voice recognition software. Similar sounding words can inadvertently be transcribed and may not be corrected upon review.  [name]., MD 10/05/2014

## 2014-10-05 NOTE — Patient Instructions (Signed)
[name] Discharge Instructions for Patients Receiving Chemotherapy  Today you received the following chemotherapy agents Alimta You also received your Vit. B-12 injection. To help prevent nausea and vomiting after your treatment, we encourage you to take your nausea medication    If you develop nausea and vomiting that is not controlled by your nausea medication, call the clinic.   BELOW ARE SYMPTOMS THAT SHOULD BE REPORTED IMMEDIATELY:  *FEVER GREATER THAN 100.5 F  *CHILLS WITH OR WITHOUT FEVER  NAUSEA AND VOMITING THAT IS NOT CONTROLLED WITH YOUR NAUSEA MEDICATION  *UNUSUAL SHORTNESS OF BREATH  *UNUSUAL BRUISING OR BLEEDING  TENDERNESS IN MOUTH AND THROAT WITH OR WITHOUT PRESENCE OF ULCERS  *URINARY PROBLEMS  *BOWEL PROBLEMS  UNUSUAL RASH Items with * indicate a potential emergency and should be followed up as soon as possible.  Feel free to call the clinic you have any questions or concerns. The clinic phone number is [phone].  Please show the [facility] at check-in to the Emergency Department and triage nurse.

## 2014-10-05 NOTE — Progress Notes (Signed)
VO  Ok to treat today with ANC of 1.3 per Dr. Julien Nordmann.

## 2014-10-26 ENCOUNTER — Ambulatory Visit (HOSPITAL_COMMUNITY)
Admission: RE | Admit: 2014-10-26 | Discharge: 2014-10-26 | Disposition: A | Discharge status: Home / Self Care | Payer: Medicare Other | Source: Ambulatory Visit | Attending: Internal Medicine | Admitting: Internal Medicine

## 2014-10-26 ENCOUNTER — Encounter (HOSPITAL_COMMUNITY): Payer: Self-pay

## 2014-10-26 DIAGNOSIS — C3490 Malignant neoplasm of unspecified part of unspecified bronchus or lung: Secondary | ICD-10-CM | POA: Diagnosis present

## 2014-10-26 DIAGNOSIS — I2510 Atherosclerotic heart disease of native coronary artery without angina pectoris: Secondary | ICD-10-CM | POA: Insufficient documentation

## 2014-10-26 DIAGNOSIS — I700 Atherosclerosis of aorta: Secondary | ICD-10-CM | POA: Insufficient documentation

## 2014-10-26 DIAGNOSIS — C3411 Malignant neoplasm of upper lobe, right bronchus or lung: Secondary | ICD-10-CM | POA: Insufficient documentation

## 2014-10-26 DIAGNOSIS — J9811 Atelectasis: Secondary | ICD-10-CM | POA: Diagnosis not present

## 2014-11-02 ENCOUNTER — Other Ambulatory Visit: Payer: Medicare Other

## 2014-11-02 ENCOUNTER — Ambulatory Visit: Payer: Medicare Other | Admitting: Physician Assistant

## 2014-11-02 ENCOUNTER — Ambulatory Visit: Payer: Medicare Other

## 2014-11-03 ENCOUNTER — Ambulatory Visit (HOSPITAL_BASED_OUTPATIENT_CLINIC_OR_DEPARTMENT_OTHER): Payer: Medicare Other | Admitting: Internal Medicine

## 2014-11-03 ENCOUNTER — Encounter: Payer: Self-pay | Admitting: Internal Medicine

## 2014-11-03 ENCOUNTER — Other Ambulatory Visit (HOSPITAL_BASED_OUTPATIENT_CLINIC_OR_DEPARTMENT_OTHER): Payer: Medicare Other

## 2014-11-03 ENCOUNTER — Telehealth: Payer: Self-pay | Admitting: Internal Medicine

## 2014-11-03 ENCOUNTER — Ambulatory Visit (HOSPITAL_BASED_OUTPATIENT_CLINIC_OR_DEPARTMENT_OTHER): Payer: Medicare Other

## 2014-11-03 VITALS — BP 135/71 | HR 67 | Temp 98.2°F | Resp 17 | Ht 62.0 in | Wt 148.6 lb

## 2014-11-03 DIAGNOSIS — C3412 Malignant neoplasm of upper lobe, left bronchus or lung: Secondary | ICD-10-CM | POA: Diagnosis not present

## 2014-11-03 DIAGNOSIS — C3431 Malignant neoplasm of lower lobe, right bronchus or lung: Secondary | ICD-10-CM

## 2014-11-03 DIAGNOSIS — C3490 Malignant neoplasm of unspecified part of unspecified bronchus or lung: Secondary | ICD-10-CM

## 2014-11-03 DIAGNOSIS — Z5111 Encounter for antineoplastic chemotherapy: Secondary | ICD-10-CM | POA: Diagnosis not present

## 2014-11-03 LAB — CBC WITH DIFFERENTIAL/PLATELET
BASO%: 0.5 % (ref 0.0–2.0)
Basophils Absolute: 0 10*3/uL (ref 0.0–0.1)
EOS%: 1.9 % (ref 0.0–7.0)
Eosinophils Absolute: 0.1 10*3/uL (ref 0.0–0.5)
HCT: 39.6 % (ref 34.8–46.6)
HEMOGLOBIN: 13.1 g/dL (ref 11.6–15.9)
LYMPH#: 1.7 10*3/uL (ref 0.9–3.3)
LYMPH%: 45.6 % (ref 14.0–49.7)
MCH: 33.4 pg (ref 25.1–34.0)
MCHC: 33.1 g/dL (ref 31.5–36.0)
MCV: 101 fL (ref 79.5–101.0)
MONO#: 0.5 10*3/uL (ref 0.1–0.9)
MONO%: 13.1 % (ref 0.0–14.0)
NEUT%: 38.9 % (ref 38.4–76.8)
NEUTROS ABS: 1.4 10*3/uL — AB (ref 1.5–6.5)
Platelets: 243 10*3/uL (ref 145–400)
RBC: 3.92 10*6/uL (ref 3.70–5.45)
RDW: 14.3 % (ref 11.2–14.5)
WBC: 3.7 10*3/uL — ABNORMAL LOW (ref 3.9–10.3)

## 2014-11-03 LAB — COMPREHENSIVE METABOLIC PANEL (CC13)
ALBUMIN: 3.3 g/dL — AB (ref 3.5–5.0)
ALK PHOS: 90 U/L (ref 40–150)
ALT: 10 U/L (ref 0–55)
ANION GAP: 8 meq/L (ref 3–11)
AST: 23 U/L (ref 5–34)
BILIRUBIN TOTAL: 0.54 mg/dL (ref 0.20–1.20)
BUN: 10.8 mg/dL (ref 7.0–26.0)
CHLORIDE: 109 meq/L (ref 98–109)
CO2: 25 mEq/L (ref 22–29)
CREATININE: 0.8 mg/dL (ref 0.6–1.1)
Calcium: 8.7 mg/dL (ref 8.4–10.4)
EGFR: 67 mL/min/{1.73_m2} — AB (ref >90)
Glucose: 85 mg/dl (ref 70–140)
Potassium: 4 mEq/L (ref 3.5–5.1)
Sodium: 141 mEq/L (ref 136–145)
Total Protein: 6.9 g/dL (ref 6.4–8.3)

## 2014-11-03 MED ORDER — SODIUM CHLORIDE 0.9 % IJ SOLN
10.0000 mL | INTRAMUSCULAR | Status: DC | PRN
  Filled 2014-11-03: qty 10

## 2014-11-03 MED ORDER — SODIUM CHLORIDE 0.9 % IV SOLN
500.0000 mg/m2 | Freq: Once | INTRAVENOUS | Status: AC
  Administered 2014-11-03: 850 mg via INTRAVENOUS
  Filled 2014-11-03: qty 34

## 2014-11-03 MED ORDER — SODIUM CHLORIDE 0.9 % IV SOLN
Freq: Once | INTRAVENOUS | Status: AC
  Administered 2014-11-03: 10:00:00 via INTRAVENOUS
  Filled 2014-11-03: qty 8

## 2014-11-03 MED ORDER — HEPARIN SOD (PORK) LOCK FLUSH 100 UNIT/ML IV SOLN
500.0000 [IU] | Freq: Once | INTRAVENOUS | Status: DC | PRN
  Filled 2014-11-03: qty 5

## 2014-11-03 MED ORDER — SODIUM CHLORIDE 0.9 % IV SOLN
Freq: Once | INTRAVENOUS | Status: AC
  Administered 2014-11-03: 10:00:00 via INTRAVENOUS

## 2014-11-03 NOTE — Progress Notes (Signed)
Verbal consent given per Dr. Julien Nordmann to treat with ANC of 1.4 today.

## 2014-11-03 NOTE — Patient Instructions (Signed)
[facility] Discharge Instructions for Patients Receiving Chemotherapy  Today you received the following chemotherapy agents Alimta You also received your Vit. B-12 injection. To help prevent nausea and vomiting after your treatment, we encourage you to take your nausea medication    If you develop nausea and vomiting that is not controlled by your nausea medication, call the clinic.   BELOW ARE SYMPTOMS THAT SHOULD BE REPORTED IMMEDIATELY:  *FEVER GREATER THAN 100.5 F  *CHILLS WITH OR WITHOUT FEVER  NAUSEA AND VOMITING THAT IS NOT CONTROLLED WITH YOUR NAUSEA MEDICATION  *UNUSUAL SHORTNESS OF BREATH  *UNUSUAL BRUISING OR BLEEDING  TENDERNESS IN MOUTH AND THROAT WITH OR WITHOUT PRESENCE OF ULCERS  *URINARY PROBLEMS  *BOWEL PROBLEMS  UNUSUAL RASH Items with * indicate a potential emergency and should be followed up as soon as possible.  Feel free to call the clinic you have any questions or concerns. The clinic phone number is [phone].  Please show the [facility] at check-in to the Emergency Department and triage nurse.

## 2014-11-03 NOTE — Progress Notes (Signed)
[facility] [phone]   Fax:(336) [place], [place] [address]  Principle Diagnosis and stage: Recurrent non-small cell lung cancer, initially diagnosed a stage IB (T2, N0, M0) adenocarcinoma in June 2008 with a tumor size of 8 cm.   Prior Therapy:  #1 status post left upper lobectomy with lymph node dissection under the care of Dr. Arlyce Dice on 06/29/2006.  #2 status post 4 cycles of adjuvant chemotherapy with cisplatin and Taxotere last dose given 08/01/2006.  #3 status post 6 cycles of systemic chemotherapy with carboplatin and Alimta for disease recurrence last dose given 12/12/2009.   Current therapy: Maintenance chemotherapy with Alimta at 500 mg per meter squared given every 3 weeks status post 67 cycles.  CHEMOTHERAPY INTENT: Palliative/maintenance.  CURRENT # OF CHEMOTHERAPY CYCLES: 68  CURRENT ANTIEMETICS: Zofran, dexamethasone and Compazine  CURRENT SMOKING STATUS: Never Smoker.   ORAL CHEMOTHERAPY AND CONSENT: None  CURRENT BISPHOSPHONATES USE: None  PAIN MANAGEMENT: None  NARCOTICS INDUCED CONSTIPATION: None  LIVING WILL AND CODE STATUS: has living will for no CODE BLUE.  INTERVAL HISTORY: [name] 81 y.o. female returns to the clinic today for follow up visit. The patient has no complaints today. She enjoyed her trip to the beach with her family. She is tolerating her maintenance chemotherapy with single agent Alimta fairly well. The patient denied having any significant nausea or vomiting. She has no fever or chills. The she denied having any significant chest pain, shortness of breath, or hemoptysis. She has no weight loss or night sweats. The patient had repeat CT scan of the chest performed recently and she is here for evaluation and discussion of her scan results before starting cycle #68.   MEDICAL HISTORY: Past Medical History  Diagnosis Date  .  History of migraine headaches   . Hypercholesterolemia   . lung ca dx'd 06/2006    rt and lt lung  . Lung cancer 3/14/1    right- Adenocarcinoma w/bronchioalveolar features  . FHx: chemotherapy 2008&2011    4 cycles cisplatin,taxotere,2008& carboplatin and Alimta 2011    ALLERGIES:  is allergic to simvastatin and iodinated diagnostic agents.  MEDICATIONS:  Current Outpatient Prescriptions  Medication Sig Dispense Refill  . Calcium Carbonate-Vit D-Min (CALTRATE PLUS PO) Take 500 mg by mouth 2 (two) times daily.      . folic acid (FOLVITE) 1 MG tablet Take 1 mg by mouth daily.      . Multiple Vitamins-Minerals (CENTRUM SILVER PO) Take 1 tablet by mouth daily.     . potassium gluconate 595 MG TABS Take 595 mg by mouth daily.    . ranitidine (ZANTAC) 75 MG tablet Take 75 mg by mouth. Pt takes as needed    . vitamin B-12 (CYANOCOBALAMIN) 500 MCG tablet Take 1,500 mcg by mouth daily.     . chlorpheniramine-carbetapentane (TUSSI-12) 4-30 MG/5ML SUSP Take by mouth every 12 (twelve) hours as needed.    Marland Kitchen dexamethasone (DECADRON) 4 MG tablet Take 4 mg by mouth as directed. 1 tablet BID the day before, day of and day after chemo    . ferrous fumarate (HEMOCYTE - 106 MG FE) 325 (106 FE) MG TABS tablet Take 1 tablet by mouth daily.    . prochlorperazine (COMPAZINE) 10 MG tablet Take 1 tablet (10 mg total) by mouth every 6 (six) hours as needed. (Patient not taking: Reported on 09/14/2014) 15 tablet 1   No  current facility-administered medications for this visit.    REVIEW OF SYSTEMS:  Constitutional: negative Eyes: negative Ears, nose, mouth, throat, and face: negative Respiratory: negative Cardiovascular: negative Gastrointestinal: negative Genitourinary:negative Integument/breast: negative Hematologic/lymphatic: negative Musculoskeletal:negative Neurological: negative Behavioral/Psych: negative Endocrine: negative Allergic/Immunologic: negative   PHYSICAL EXAMINATION: General  appearance: alert, cooperative and no distress Head: Normocephalic, without obvious abnormality, atraumatic Neck: no adenopathy Lymph nodes: Cervical, supraclavicular, and axillary nodes normal. Resp: clear to auscultation bilaterally Back: symmetric, no curvature. ROM normal. No CVA tenderness. Cardio: regular rate and rhythm, S1, S2 normal, no murmur, click, rub or gallop GI: soft, non-tender; bowel sounds normal; no masses,  no organomegaly Extremities: extremities normal, atraumatic, no cyanosis or edema Neurologic: Alert and oriented X 3, normal strength and tone. Normal symmetric reflexes. Normal coordination and gait  ECOG PERFORMANCE STATUS: 0 - Asymptomatic  Blood pressure 135/71, pulse 67, temperature 98.2 F (36.8 C), temperature source Oral, resp. rate 17, height '5\' 2"'$  (1.575 m), weight 148 lb 9.6 oz (67.405 kg), SpO2 100 %.  LABORATORY DATA: Lab Results  Component Value Date   WBC 3.7* 11/03/2014   HGB 13.1 11/03/2014   HCT 39.6 11/03/2014   MCV 101.0 11/03/2014   PLT 243 11/03/2014      Chemistry      Component Value Date/Time   NA 141 10/05/2014 0821   NA 139 01/24/2012 1008   NA 140 08/21/2011 1306   K 3.7 10/05/2014 0821   K 4.2 01/24/2012 1008   K 4.1 08/21/2011 1306   CL 108* 11/04/2012 0947   CL 103 01/24/2012 1008   CL 107 08/21/2011 1306   CO2 24 10/05/2014 0821   CO2 28 01/24/2012 1008   CO2 22 08/21/2011 1306   BUN 10.7 10/05/2014 0821   BUN 13 01/24/2012 1008   BUN 11 08/21/2011 1306   CREATININE 0.8 10/05/2014 0821   CREATININE 0.8 01/24/2012 1008   CREATININE 0.81 08/21/2011 1306      Component Value Date/Time   CALCIUM 8.7 10/05/2014 0821   CALCIUM 8.6 01/24/2012 1008   CALCIUM 8.8 08/21/2011 1306   ALKPHOS 78 10/05/2014 0821   ALKPHOS 79 01/24/2012 1008   ALKPHOS 87 08/21/2011 1306   AST 21 10/05/2014 0821   AST 24 01/24/2012 1008   AST 17 08/21/2011 1306   ALT 12 10/05/2014 0821   ALT 21 01/24/2012 1008   ALT 8 08/21/2011 1306     BILITOT 0.53 10/05/2014 0821   BILITOT 0.90 01/24/2012 1008   BILITOT 0.6 08/21/2011 1306       RADIOGRAPHIC STUDIES: Ct Chest Wo Contrast  10/26/2014   CLINICAL DATA:  Lung cancer.  Ongoing chemotherapy.  EXAM: CT CHEST WITHOUT CONTRAST  TECHNIQUE: Multidetector CT imaging of the chest was performed following the standard protocol without IV contrast.  COMPARISON:  08/17/2014  FINDINGS: Mediastinum/Nodes: Stable coronary and aortic atherosclerosis.  Lungs/Pleura: Because of its orientation, the right upper lobe mass is probably best measured in short axis, or measures 3.5 cm today and 3.5 cm previously. In long axis the mass is approximately 4.5 cm today, and 4.7 cm previously. There is associated atelectasis and alveolitis anteriorly in the right upper lobe. Nodular volume loss in the right middle lobe measures 0.9 cm in short axis thickness, stable.  Stable 4 mm left upper lobe nodule, image 20 series 5. Stable 1.1 cm sub solid nodule in the lingula, image 32 series 5. Stable left lower lobe scarring.  Upper abdomen: Unremarkable  Musculoskeletal: Lower thoracic superior endplate  Schmorl' s nodes. Prior left sixth rib osteotomy.  IMPRESSION: 1. Stable CT appearance dominant right upper lobe mass with postobstructive atelectasis/pneumonitis, and stable nodular volume loss in the middle lobe and nodules in the left upper lobe.   Electronically Signed   By: Van Clines M.D.   On: 10/26/2014 15:19   ASSESSMENT AND PLAN: This is a very pleasant 81 years old white female with recurrent non-small cell lung cancer, adenocarcinoma. The patient is currently undergoing maintenance chemotherapy with single agent Alimta status post 67 cycles and she is tolerating it fairly well.  The recent CT scan of the chest showed a stable disease. I discussed the scan results and gave a copy of the report to the patient today. I recommended for the patient to proceed with her maintenance chemotherapy with single  agent Alimta as scheduled. We'll proceed today with cycle #68 as scheduled.  She will come back for follow-up visit in 3 weeks with the next cycle of her treatment. She was advised to call immediately if she has any concerning symptoms in the interval.  Disclaimer: This note was dictated with voice recognition software. Similar sounding words can inadvertently be transcribed and may not be corrected upon review.  [name]., MD 11/03/2014

## 2014-11-03 NOTE — Telephone Encounter (Signed)
Gave and printed appt sched and avs for pt for June and July  °

## 2014-11-24 ENCOUNTER — Encounter: Payer: Self-pay | Admitting: Physician Assistant

## 2014-11-24 ENCOUNTER — Ambulatory Visit (HOSPITAL_BASED_OUTPATIENT_CLINIC_OR_DEPARTMENT_OTHER): Payer: Medicare Other

## 2014-11-24 ENCOUNTER — Other Ambulatory Visit (HOSPITAL_BASED_OUTPATIENT_CLINIC_OR_DEPARTMENT_OTHER): Payer: Medicare Other

## 2014-11-24 ENCOUNTER — Ambulatory Visit (HOSPITAL_BASED_OUTPATIENT_CLINIC_OR_DEPARTMENT_OTHER): Payer: Medicare Other | Admitting: Physician Assistant

## 2014-11-24 ENCOUNTER — Telehealth: Payer: Self-pay | Admitting: Internal Medicine

## 2014-11-24 VITALS — BP 145/74 | HR 66 | Temp 98.4°F | Resp 18 | Ht 62.0 in | Wt 148.2 lb

## 2014-11-24 DIAGNOSIS — C3412 Malignant neoplasm of upper lobe, left bronchus or lung: Secondary | ICD-10-CM

## 2014-11-24 DIAGNOSIS — C3491 Malignant neoplasm of unspecified part of right bronchus or lung: Secondary | ICD-10-CM

## 2014-11-24 DIAGNOSIS — Z5111 Encounter for antineoplastic chemotherapy: Secondary | ICD-10-CM

## 2014-11-24 DIAGNOSIS — C3490 Malignant neoplasm of unspecified part of unspecified bronchus or lung: Secondary | ICD-10-CM

## 2014-11-24 DIAGNOSIS — C3431 Malignant neoplasm of lower lobe, right bronchus or lung: Secondary | ICD-10-CM

## 2014-11-24 LAB — COMPREHENSIVE METABOLIC PANEL (CC13)
ALT: 9 U/L (ref 0–55)
AST: 23 U/L (ref 5–34)
Albumin: 3.4 g/dL — ABNORMAL LOW (ref 3.5–5.0)
Alkaline Phosphatase: 83 U/L (ref 40–150)
Anion Gap: 6 meq/L (ref 3–11)
BUN: 11 mg/dL (ref 7.0–26.0)
CO2: 27 meq/L (ref 22–29)
Calcium: 8.8 mg/dL (ref 8.4–10.4)
Chloride: 109 meq/L (ref 98–109)
Creatinine: 0.8 mg/dL (ref 0.6–1.1)
EGFR: 72 ml/min/1.73 m2 — ABNORMAL LOW (ref >90)
Glucose: 69 mg/dL — ABNORMAL LOW (ref 70–140)
Potassium: 4.2 meq/L (ref 3.5–5.1)
Sodium: 142 meq/L (ref 136–145)
Total Bilirubin: 0.55 mg/dL (ref 0.20–1.20)
Total Protein: 6.7 g/dL (ref 6.4–8.3)

## 2014-11-24 LAB — CBC WITH DIFFERENTIAL/PLATELET
BASO%: 0 % (ref 0.0–2.0)
Basophils Absolute: 0 10e3/uL (ref 0.0–0.1)
EOS%: 1.6 % (ref 0.0–7.0)
Eosinophils Absolute: 0.1 10e3/uL (ref 0.0–0.5)
HCT: 38 % (ref 34.8–46.6)
HGB: 12.5 g/dL (ref 11.6–15.9)
LYMPH%: 41.6 % (ref 14.0–49.7)
MCH: 33.2 pg (ref 25.1–34.0)
MCHC: 32.9 g/dL (ref 31.5–36.0)
MCV: 100.8 fL (ref 79.5–101.0)
MONO#: 0.4 10e3/uL (ref 0.1–0.9)
MONO%: 11.1 % (ref 0.0–14.0)
NEUT#: 1.4 10e3/uL — ABNORMAL LOW (ref 1.5–6.5)
NEUT%: 45.7 % (ref 38.4–76.8)
Platelets: 241 10e3/uL (ref 145–400)
RBC: 3.77 10e6/uL (ref 3.70–5.45)
RDW: 14.3 % (ref 11.2–14.5)
WBC: 3.2 10e3/uL — ABNORMAL LOW (ref 3.9–10.3)
lymph#: 1.3 10e3/uL (ref 0.9–3.3)

## 2014-11-24 MED ORDER — HEPARIN SOD (PORK) LOCK FLUSH 100 UNIT/ML IV SOLN
500.0000 [IU] | Freq: Once | INTRAVENOUS | Status: AC | PRN
  Administered 2014-11-24: 500 [IU]
  Filled 2014-11-24: qty 5

## 2014-11-24 MED ORDER — SODIUM CHLORIDE 0.9 % IV SOLN
500.0000 mg/m2 | Freq: Once | INTRAVENOUS | Status: AC
  Administered 2014-11-24: 850 mg via INTRAVENOUS
  Filled 2014-11-24: qty 34

## 2014-11-24 MED ORDER — SODIUM CHLORIDE 0.9 % IJ SOLN
10.0000 mL | INTRAMUSCULAR | Status: DC | PRN
  Administered 2014-11-24: 10 mL
  Filled 2014-11-24: qty 10

## 2014-11-24 MED ORDER — SODIUM CHLORIDE 0.9 % IV SOLN
Freq: Once | INTRAVENOUS | Status: AC
  Administered 2014-11-24: 12:00:00 via INTRAVENOUS
  Filled 2014-11-24: qty 8

## 2014-11-24 MED ORDER — SODIUM CHLORIDE 0.9 % IV SOLN
Freq: Once | INTRAVENOUS | Status: AC
  Administered 2014-11-24: 12:00:00 via INTRAVENOUS

## 2014-11-24 NOTE — Progress Notes (Addendum)
[facility] [phone]   Fax:(336) [place], [place] [address]  Principle Diagnosis and stage: Recurrent non-small cell lung cancer, initially diagnosed a stage IB (T2, N0, M0) adenocarcinoma in June 2008 with a tumor size of 8 cm.   Prior Therapy:  #1 status post left upper lobectomy with lymph node dissection under the care of Dr. Arlyce Dice on 06/29/2006.  #2 status post 4 cycles of adjuvant chemotherapy with cisplatin and Taxotere last dose given 08/01/2006.  #3 status post 6 cycles of systemic chemotherapy with carboplatin and Alimta for disease recurrence last dose given 12/12/2009.   Current therapy: Maintenance chemotherapy with Alimta at 500 mg per meter squared given every 3 weeks status post 68 cycles.  CHEMOTHERAPY INTENT: Palliative/maintenance.  CURRENT # OF CHEMOTHERAPY CYCLES: 69  CURRENT ANTIEMETICS: Zofran, dexamethasone and Compazine  CURRENT SMOKING STATUS: Never Smoker.   ORAL CHEMOTHERAPY AND CONSENT: None  CURRENT BISPHOSPHONATES USE: None  PAIN MANAGEMENT: None  NARCOTICS INDUCED CONSTIPATION: None  LIVING WILL AND CODE STATUS: has living will for no CODE BLUE.  INTERVAL HISTORY: [name] 81 y.o. female returns to the clinic today for follow up visit. The patient has no complaints today. She is tolerating her maintenance chemotherapy with single agent Alimta fairly well. She reports she usually has a few days of constipation after chemotherapy but this tends to resolve on its own and is not problematic. The patient denied having any significant nausea or vomiting. She has no fever or chills. The she denied having any significant chest pain, shortness of breath, or hemoptysis. She has no weight loss or night sweats. She presents today to proceed with cycle #69 of her maintenance chemotherapy with single agent Alimta.  MEDICAL HISTORY: Past Medical  History  Diagnosis Date  . History of migraine headaches   . Hypercholesterolemia   . lung ca dx'd 06/2006    rt and lt lung  . Lung cancer 3/14/1    right- Adenocarcinoma w/bronchioalveolar features  . FHx: chemotherapy 2008&2011    4 cycles cisplatin,taxotere,2008& carboplatin and Alimta 2011    ALLERGIES:  is allergic to simvastatin and iodinated diagnostic agents.  MEDICATIONS:  Current Outpatient Prescriptions  Medication Sig Dispense Refill  . Calcium Carbonate-Vit D-Min (CALTRATE PLUS PO) Take 500 mg by mouth 2 (two) times daily.      . chlorpheniramine-carbetapentane (TUSSI-12) 4-30 MG/5ML SUSP Take by mouth every 12 (twelve) hours as needed.    Marland Kitchen dexamethasone (DECADRON) 4 MG tablet Take 4 mg by mouth as directed. 1 tablet BID the day before, day of and day after chemo    . ferrous fumarate (HEMOCYTE - 106 MG FE) 325 (106 FE) MG TABS tablet Take 1 tablet by mouth daily.    . folic acid (FOLVITE) 1 MG tablet Take 1 mg by mouth daily.      . Multiple Vitamins-Minerals (CENTRUM SILVER PO) Take 1 tablet by mouth daily.     . potassium gluconate 595 MG TABS Take 595 mg by mouth daily.    . prochlorperazine (COMPAZINE) 10 MG tablet Take 1 tablet (10 mg total) by mouth every 6 (six) hours as needed. 15 tablet 1  . ranitidine (ZANTAC) 75 MG tablet Take 75 mg by mouth. Pt takes as needed    . vitamin B-12 (CYANOCOBALAMIN) 500 MCG tablet Take 1,500 mcg by mouth daily.      No current facility-administered medications for  this visit.   Facility-Administered Medications Ordered in Other Visits  Medication Dose Route Frequency Provider Last Rate Last Dose  . heparin lock flush 100 unit/mL  500 Units Intracatheter Once PRN Curt Bears, MD      . ondansetron (ZOFRAN) 16 mg, dexamethasone (DECADRON) 20 mg in sodium chloride 0.9 % 50 mL IVPB   Intravenous Once Curt Bears, MD      . PEMEtrexed (ALIMTA) 850 mg in sodium chloride 0.9 % 100 mL chemo infusion  500 mg/m2 (Treatment Plan  Actual) Intravenous Once Curt Bears, MD      . sodium chloride 0.9 % injection 10 mL  10 mL Intracatheter PRN Curt Bears, MD        REVIEW OF SYSTEMS:  Constitutional: negative Eyes: negative Ears, nose, mouth, throat, and face: negative Respiratory: negative Cardiovascular: negative Gastrointestinal: positive for constipation and For a few days after chemotherapy, self limiting Genitourinary:negative Integument/breast: negative Hematologic/lymphatic: negative Musculoskeletal:negative Neurological: negative Behavioral/Psych: negative Endocrine: negative Allergic/Immunologic: negative   PHYSICAL EXAMINATION: General appearance: alert, cooperative and no distress Head: Normocephalic, without obvious abnormality, atraumatic Neck: no adenopathy Lymph nodes: Cervical, supraclavicular, and axillary nodes normal. Resp: clear to auscultation bilaterally Back: symmetric, no curvature. ROM normal. No CVA tenderness. Cardio: regular rate and rhythm, S1, S2 normal, no murmur, click, rub or gallop GI: soft, non-tender; bowel sounds normal; no masses,  no organomegaly Extremities: extremities normal, atraumatic, no cyanosis or edema Neurologic: Alert and oriented X 3, normal strength and tone. Normal symmetric reflexes. Normal coordination and gait  ECOG PERFORMANCE STATUS: 0 - Asymptomatic  Blood pressure 145/74, pulse 66, temperature 98.4 F (36.9 C), temperature source Oral, resp. rate 18, height '5\' 2"'$  (1.575 m), weight 148 lb 3.2 oz (67.223 kg), SpO2 100 %.  LABORATORY DATA: Lab Results  Component Value Date   WBC 3.2* 11/24/2014   HGB 12.5 11/24/2014   HCT 38.0 11/24/2014   MCV 100.8 11/24/2014   PLT 241 11/24/2014      Chemistry      Component Value Date/Time   NA 142 11/24/2014 0936   NA 139 01/24/2012 1008   NA 140 08/21/2011 1306   K 4.2 11/24/2014 0936   K 4.2 01/24/2012 1008   K 4.1 08/21/2011 1306   CL 108* 11/04/2012 0947   CL 103 01/24/2012 1008   CL  107 08/21/2011 1306   CO2 27 11/24/2014 0936   CO2 28 01/24/2012 1008   CO2 22 08/21/2011 1306   BUN 11.0 11/24/2014 0936   BUN 13 01/24/2012 1008   BUN 11 08/21/2011 1306   CREATININE 0.8 11/24/2014 0936   CREATININE 0.8 01/24/2012 1008   CREATININE 0.81 08/21/2011 1306      Component Value Date/Time   CALCIUM 8.8 11/24/2014 0936   CALCIUM 8.6 01/24/2012 1008   CALCIUM 8.8 08/21/2011 1306   ALKPHOS 83 11/24/2014 0936   ALKPHOS 79 01/24/2012 1008   ALKPHOS 87 08/21/2011 1306   AST 23 11/24/2014 0936   AST 24 01/24/2012 1008   AST 17 08/21/2011 1306   ALT 9 11/24/2014 0936   ALT 21 01/24/2012 1008   ALT 8 08/21/2011 1306   BILITOT 0.55 11/24/2014 0936   BILITOT 0.90 01/24/2012 1008   BILITOT 0.6 08/21/2011 1306       RADIOGRAPHIC STUDIES: Ct Chest Wo Contrast  10/26/2014   CLINICAL DATA:  Lung cancer.  Ongoing chemotherapy.  EXAM: CT CHEST WITHOUT CONTRAST  TECHNIQUE: Multidetector CT imaging of the chest was performed following the standard protocol  without IV contrast.  COMPARISON:  08/17/2014  FINDINGS: Mediastinum/Nodes: Stable coronary and aortic atherosclerosis.  Lungs/Pleura: Because of its orientation, the right upper lobe mass is probably best measured in short axis, or measures 3.5 cm today and 3.5 cm previously. In long axis the mass is approximately 4.5 cm today, and 4.7 cm previously. There is associated atelectasis and alveolitis anteriorly in the right upper lobe. Nodular volume loss in the right middle lobe measures 0.9 cm in short axis thickness, stable.  Stable 4 mm left upper lobe nodule, image 20 series 5. Stable 1.1 cm sub solid nodule in the lingula, image 32 series 5. Stable left lower lobe scarring.  Upper abdomen: Unremarkable  Musculoskeletal: Lower thoracic superior endplate Schmorl' s nodes. Prior left sixth rib osteotomy.  IMPRESSION: 1. Stable CT appearance dominant right upper lobe mass with postobstructive atelectasis/pneumonitis, and stable nodular  volume loss in the middle lobe and nodules in the left upper lobe.   Electronically Signed   By: Van Clines M.D.   On: 10/26/2014 15:19   ASSESSMENT AND PLAN: This is a very pleasant 81 years old white female with recurrent non-small cell lung cancer, adenocarcinoma. The patient is currently undergoing maintenance chemotherapy with single agent Alimta status post 68 cycles and she is tolerating it fairly well.  The recent CT scan of the chest showed a stable disease. Patient was discussed with and also seen by Dr. Julien Nordmann. She'll proceed with cycle #69 today of her maintenance chemotherapy with single agent Alimta at 500 mg/m given every 3 weeks as scheduled.  She'll follow-up in 3 weeks prior to the start of cycle #70. She was advised to call immediately if she has any concerning symptoms in the interval.  Disclaimer: This note was dictated with voice recognition software. Similar sounding words can inadvertently be transcribed and may not be corrected upon review.  Carlton Adam, [name] 11/24/2014    ADDENDUM: Hematology/Oncology Attending: I had a face to face encounter with the patient. I recommended her care plan. This is a very pleasant 81 years old white female with recurrent non-small cell lung cancer who is currently undergoing maintenance treatment with single agent Alimta status post 68 cycles. She is tolerating her treatment fairly well with no significant adverse effects. I recommended for the patient to proceed with cycle #69 today as a scheduled. She will come back for follow-up visit in 3 weeks for reevaluation with the next cycle of her treatment. She was advised to call immediately if she has any concerning symptoms in the interval.  Disclaimer: This note was dictated with voice recognition software. Similar sounding words can inadvertently be transcribed and may be missed upon review.  [name]., MD 11/26/2014

## 2014-11-24 NOTE — Progress Notes (Signed)
OK to treat with ANC 59f1.4 per [place] [facility].

## 2014-11-24 NOTE — Patient Instructions (Signed)
Follow-up in 3 weeks prior to next scheduled cycle of maintenance chemotherapy 

## 2014-11-24 NOTE — Patient Instructions (Signed)
[facility] Discharge Instructions for Patients Receiving Chemotherapy  Today you received the following chemotherapy agents Alimta You also received your Vit. B-12 injection. To help prevent nausea and vomiting after your treatment, we encourage you to take your nausea medication    If you develop nausea and vomiting that is not controlled by your nausea medication, call the clinic.   BELOW ARE SYMPTOMS THAT SHOULD BE REPORTED IMMEDIATELY:  *FEVER GREATER THAN 100.5 F  *CHILLS WITH OR WITHOUT FEVER  NAUSEA AND VOMITING THAT IS NOT CONTROLLED WITH YOUR NAUSEA MEDICATION  *UNUSUAL SHORTNESS OF BREATH  *UNUSUAL BRUISING OR BLEEDING  TENDERNESS IN MOUTH AND THROAT WITH OR WITHOUT PRESENCE OF ULCERS  *URINARY PROBLEMS  *BOWEL PROBLEMS  UNUSUAL RASH Items with * indicate a potential emergency and should be followed up as soon as possible.  Feel free to call the clinic you have any questions or concerns. The clinic phone number is [phone].  Please show the [facility] at check-in to the Emergency Department and triage nurse.

## 2014-11-24 NOTE — Telephone Encounter (Signed)
Gave and prnted appt sched adn avs fo rpt for July and Aug

## 2014-12-12 ENCOUNTER — Other Ambulatory Visit: Payer: Self-pay | Admitting: Dermatology

## 2014-12-14 ENCOUNTER — Ambulatory Visit (HOSPITAL_BASED_OUTPATIENT_CLINIC_OR_DEPARTMENT_OTHER): Payer: Medicare Other | Admitting: Internal Medicine

## 2014-12-14 ENCOUNTER — Other Ambulatory Visit (HOSPITAL_BASED_OUTPATIENT_CLINIC_OR_DEPARTMENT_OTHER): Payer: Medicare Other

## 2014-12-14 ENCOUNTER — Encounter: Payer: Self-pay | Admitting: Internal Medicine

## 2014-12-14 ENCOUNTER — Telehealth: Payer: Self-pay | Admitting: *Deleted

## 2014-12-14 ENCOUNTER — Ambulatory Visit (HOSPITAL_BASED_OUTPATIENT_CLINIC_OR_DEPARTMENT_OTHER): Payer: Medicare Other

## 2014-12-14 ENCOUNTER — Other Ambulatory Visit: Payer: Self-pay | Admitting: Medical Oncology

## 2014-12-14 VITALS — BP 138/61 | HR 75 | Temp 98.4°F | Resp 18 | Ht 62.0 in | Wt 147.4 lb

## 2014-12-14 DIAGNOSIS — C3490 Malignant neoplasm of unspecified part of unspecified bronchus or lung: Secondary | ICD-10-CM

## 2014-12-14 DIAGNOSIS — C3412 Malignant neoplasm of upper lobe, left bronchus or lung: Secondary | ICD-10-CM | POA: Diagnosis not present

## 2014-12-14 DIAGNOSIS — Z5111 Encounter for antineoplastic chemotherapy: Secondary | ICD-10-CM | POA: Insufficient documentation

## 2014-12-14 DIAGNOSIS — C3431 Malignant neoplasm of lower lobe, right bronchus or lung: Secondary | ICD-10-CM

## 2014-12-14 LAB — COMPREHENSIVE METABOLIC PANEL (CC13)
ALT: 13 U/L (ref 0–55)
AST: 23 U/L (ref 5–34)
Albumin: 3.3 g/dL — ABNORMAL LOW (ref 3.5–5.0)
Alkaline Phosphatase: 80 U/L (ref 40–150)
Anion Gap: 7 mEq/L (ref 3–11)
BUN: 13.2 mg/dL (ref 7.0–26.0)
CO2: 24 mEq/L (ref 22–29)
CREATININE: 0.8 mg/dL (ref 0.6–1.1)
Calcium: 8.9 mg/dL (ref 8.4–10.4)
Chloride: 111 mEq/L — ABNORMAL HIGH (ref 98–109)
EGFR: 66 mL/min/{1.73_m2} — AB (ref >90)
Glucose: 83 mg/dl (ref 70–140)
Potassium: 3.8 mEq/L (ref 3.5–5.1)
Sodium: 142 mEq/L (ref 136–145)
Total Bilirubin: 0.55 mg/dL (ref 0.20–1.20)
Total Protein: 6.7 g/dL (ref 6.4–8.3)

## 2014-12-14 LAB — CBC WITH DIFFERENTIAL/PLATELET
BASO%: 0.4 % (ref 0.0–2.0)
Basophils Absolute: 0 10*3/uL (ref 0.0–0.1)
EOS%: 0.8 % (ref 0.0–7.0)
Eosinophils Absolute: 0 10*3/uL (ref 0.0–0.5)
HEMATOCRIT: 37 % (ref 34.8–46.6)
HEMOGLOBIN: 12.2 g/dL (ref 11.6–15.9)
LYMPH%: 31.8 % (ref 14.0–49.7)
MCH: 32.8 pg (ref 25.1–34.0)
MCHC: 32.9 g/dL (ref 31.5–36.0)
MCV: 99.6 fL (ref 79.5–101.0)
MONO#: 0.4 10*3/uL (ref 0.1–0.9)
MONO%: 12.4 % (ref 0.0–14.0)
NEUT#: 1.9 10*3/uL (ref 1.5–6.5)
NEUT%: 54.6 % (ref 38.4–76.8)
PLATELETS: 280 10*3/uL (ref 145–400)
RBC: 3.72 10*6/uL (ref 3.70–5.45)
RDW: 14 % (ref 11.2–14.5)
WBC: 3.6 10*3/uL — ABNORMAL LOW (ref 3.9–10.3)
lymph#: 1.1 10*3/uL (ref 0.9–3.3)

## 2014-12-14 MED ORDER — SODIUM CHLORIDE 0.9 % IV SOLN
Freq: Once | INTRAVENOUS | Status: AC
  Administered 2014-12-14: 12:00:00 via INTRAVENOUS

## 2014-12-14 MED ORDER — SODIUM CHLORIDE 0.9 % IV SOLN
Freq: Once | INTRAVENOUS | Status: AC
  Administered 2014-12-14: 12:00:00 via INTRAVENOUS
  Filled 2014-12-14: qty 8

## 2014-12-14 MED ORDER — SODIUM CHLORIDE 0.9 % IJ SOLN
10.0000 mL | INTRAMUSCULAR | Status: DC | PRN
  Administered 2014-12-14: 10 mL
  Filled 2014-12-14: qty 10

## 2014-12-14 MED ORDER — HEPARIN SOD (PORK) LOCK FLUSH 100 UNIT/ML IV SOLN
500.0000 [IU] | Freq: Once | INTRAVENOUS | Status: AC | PRN
  Administered 2014-12-14: 500 [IU]
  Filled 2014-12-14: qty 5

## 2014-12-14 MED ORDER — CYANOCOBALAMIN 1000 MCG/ML IJ SOLN
1000.0000 ug | Freq: Once | INTRAMUSCULAR | Status: AC
  Administered 2014-12-14: 1000 ug via INTRAMUSCULAR

## 2014-12-14 MED ORDER — SODIUM CHLORIDE 0.9 % IV SOLN
500.0000 mg/m2 | Freq: Once | INTRAVENOUS | Status: AC
  Administered 2014-12-14: 850 mg via INTRAVENOUS
  Filled 2014-12-14: qty 34

## 2014-12-14 MED ORDER — CYANOCOBALAMIN 1000 MCG/ML IJ SOLN
INTRAMUSCULAR | Status: AC
  Filled 2014-12-14: qty 1

## 2014-12-14 NOTE — Progress Notes (Signed)
[facility] [phone]   Fax:(336) [place], MD [place] [address]  Principle Diagnosis and stage: Recurrent non-small cell lung cancer, initially diagnosed a stage IB (T2, N0, M0) adenocarcinoma in June 2008 with a tumor size of 8 cm.   Prior Therapy:  #1 status post left upper lobectomy with lymph node dissection under the care of Dr. Arlyce Dice on 06/29/2006.  #2 status post 4 cycles of adjuvant chemotherapy with cisplatin and Taxotere last dose given 08/01/2006.  #3 status post 6 cycles of systemic chemotherapy with carboplatin and Alimta for disease recurrence last dose given 12/12/2009.   Current therapy: Maintenance chemotherapy with Alimta at 500 mg per meter squared given every 3 weeks status post 69 cycles.  CHEMOTHERAPY INTENT: Palliative/maintenance.  CURRENT # OF CHEMOTHERAPY CYCLES: 70  CURRENT ANTIEMETICS: Zofran, dexamethasone and Compazine  CURRENT SMOKING STATUS: Never Smoker.   ORAL CHEMOTHERAPY AND CONSENT: None   CURRENT BISPHOSPHONATES USE: None  PAIN MANAGEMENT: None  NARCOTICS INDUCED CONSTIPATION: None  LIVING WILL AND CODE STATUS: has living will for no CODE BLUE.  INTERVAL HISTORY: [name] 81 y.o. female returns to the clinic today for follow up visit. The patient has no complaints today. She is tolerating her maintenance chemotherapy with single agent Alimta fairly well except for mild fatigue a few days after the chemotherapy. The patient denied having any significant nausea or vomiting. She has no fever or chills. The she denied having any significant chest pain, shortness of breath, or hemoptysis. She has no weight loss or night sweats. She is here for evaluation and discussion of her scan results before starting cycle #70.   MEDICAL HISTORY: Past Medical History  Diagnosis Date  . History of migraine headaches   . Hypercholesterolemia   . lung  ca dx'd 06/2006    rt and lt lung  . Lung cancer 3/14/1    right- Adenocarcinoma w/bronchioalveolar features  . FHx: chemotherapy 2008&2011    4 cycles cisplatin,taxotere,2008& carboplatin and Alimta 2011    ALLERGIES:  is allergic to simvastatin and iodinated diagnostic agents.  MEDICATIONS:  Current Outpatient Prescriptions  Medication Sig Dispense Refill  . Calcium Carbonate-Vit D-Min (CALTRATE PLUS PO) Take 500 mg by mouth 2 (two) times daily.      . chlorpheniramine-carbetapentane (TUSSI-12) 4-30 MG/5ML SUSP Take by mouth every 12 (twelve) hours as needed.    Marland Kitchen dexamethasone (DECADRON) 4 MG tablet Take 4 mg by mouth as directed. 1 tablet BID the day before, day of and day after chemo    . ferrous fumarate (HEMOCYTE - 106 MG FE) 325 (106 FE) MG TABS tablet Take 1 tablet by mouth daily.    . folic acid (FOLVITE) 1 MG tablet Take 1 mg by mouth daily.      . Multiple Vitamins-Minerals (CENTRUM SILVER PO) Take 1 tablet by mouth daily.     . potassium gluconate 595 MG TABS Take 595 mg by mouth daily.    . prochlorperazine (COMPAZINE) 10 MG tablet Take 1 tablet (10 mg total) by mouth every 6 (six) hours as needed. 15 tablet 1  . ranitidine (ZANTAC) 75 MG tablet Take 75 mg by mouth. Pt takes as needed    . vitamin B-12 (CYANOCOBALAMIN) 500 MCG tablet Take 1,500 mcg by mouth daily.      No current facility-administered medications for this visit.    REVIEW OF SYSTEMS:  A comprehensive review of  systems was negative except for: Constitutional: positive for fatigue   PHYSICAL EXAMINATION: General appearance: alert, cooperative and no distress Head: Normocephalic, without obvious abnormality, atraumatic Neck: no adenopathy Lymph nodes: Cervical, supraclavicular, and axillary nodes normal. Resp: clear to auscultation bilaterally Back: symmetric, no curvature. ROM normal. No CVA tenderness. Cardio: regular rate and rhythm, S1, S2 normal, no murmur, click, rub or gallop GI: soft, non-tender;  bowel sounds normal; no masses,  no organomegaly Extremities: extremities normal, atraumatic, no cyanosis or edema Neurologic: Alert and oriented X 3, normal strength and tone. Normal symmetric reflexes. Normal coordination and gait  ECOG PERFORMANCE STATUS: 0 - Asymptomatic  Blood pressure 138/61, pulse 75, temperature 98.4 F (36.9 C), temperature source Oral, resp. rate 18, height '5\' 2"'$  (1.575 m), weight 147 lb 6.4 oz (66.86 kg), SpO2 99 %.  LABORATORY DATA: Lab Results  Component Value Date   WBC 3.6* 12/14/2014   HGB 12.2 12/14/2014   HCT 37.0 12/14/2014   MCV 99.6 12/14/2014   PLT 280 12/14/2014      Chemistry      Component Value Date/Time   NA 142 11/24/2014 0936   NA 139 01/24/2012 1008   NA 140 08/21/2011 1306   K 4.2 11/24/2014 0936   K 4.2 01/24/2012 1008   K 4.1 08/21/2011 1306   CL 108* 11/04/2012 0947   CL 103 01/24/2012 1008   CL 107 08/21/2011 1306   CO2 27 11/24/2014 0936   CO2 28 01/24/2012 1008   CO2 22 08/21/2011 1306   BUN 11.0 11/24/2014 0936   BUN 13 01/24/2012 1008   BUN 11 08/21/2011 1306   CREATININE 0.8 11/24/2014 0936   CREATININE 0.8 01/24/2012 1008   CREATININE 0.81 08/21/2011 1306      Component Value Date/Time   CALCIUM 8.8 11/24/2014 0936   CALCIUM 8.6 01/24/2012 1008   CALCIUM 8.8 08/21/2011 1306   ALKPHOS 83 11/24/2014 0936   ALKPHOS 79 01/24/2012 1008   ALKPHOS 87 08/21/2011 1306   AST 23 11/24/2014 0936   AST 24 01/24/2012 1008   AST 17 08/21/2011 1306   ALT 9 11/24/2014 0936   ALT 21 01/24/2012 1008   ALT 8 08/21/2011 1306   BILITOT 0.55 11/24/2014 0936   BILITOT 0.90 01/24/2012 1008   BILITOT 0.6 08/21/2011 1306       RADIOGRAPHIC STUDIES: No results found. ASSESSMENT AND PLAN: This is a very pleasant 81 years old white female with recurrent non-small cell lung cancer, adenocarcinoma. The patient is currently undergoing maintenance chemotherapy with single agent Alimta status post 69 cycles and she is tolerating it  fairly well except for mild fatigue.  I recommended for the patient to proceed with her maintenance chemotherapy with single agent Alimta as scheduled. We'll proceed today with cycle #70 as scheduled.  She will come back for follow-up visit in 3 weeks with the next cycle of her treatment. She was advised to call immediately if she has any concerning symptoms in the interval.  Disclaimer: This note was dictated with voice recognition software. Similar sounding words can inadvertently be transcribed and may not be corrected upon review.  [name]., MD 12/14/2014

## 2014-12-14 NOTE — Patient Instructions (Signed)
Pemetrexed injection What is this medicine? PEMETREXED (PEM e TREX ed) is a chemotherapy drug. This medicine affects cells that are rapidly growing, such as cancer cells and cells in your mouth and stomach. It is usually used to treat lung cancers like non-small cell lung cancer and mesothelioma. It may also be used to treat other cancers. This medicine may be used for other purposes; ask your health care provider or pharmacist if you have questions. COMMON BRAND NAME(S): Alimta What should I tell my health care provider before I take this medicine? They need to know if you have any of these conditions: -if you frequently drink alcohol containing beverages -infection (especially a virus infection such as chickenpox, cold sores, or herpes) -kidney disease -liver disease -low blood counts, like low platelets, red bloods, or white blood cells -an unusual or allergic reaction to pemetrexed, mannitol, other medicines, foods, dyes, or preservatives -pregnant or trying to get pregnant -breast-feeding How should I use this medicine? This drug is given as an infusion into a vein. It is administered in a hospital or clinic by a specially trained health care professional. Talk to your pediatrician regarding the use of this medicine in children. Special care may be needed. Overdosage: If you think you have taken too much of this medicine contact a poison control center or emergency room at once. NOTE: This medicine is only for you. Do not share this medicine with others. What if I miss a dose? It is important not to miss your dose. Call your doctor or health care professional if you are unable to keep an appointment. What may interact with this medicine? -aspirin and aspirin-like medicines -medicines to increase blood counts like filgrastim, pegfilgrastim, sargramostim -methotrexate -NSAIDS, medicines for pain and inflammation, like ibuprofen or naproxen -probenecid -pyrimethamine -vaccines Talk to  your doctor or health care professional before taking any of these medicines: -acetaminophen -aspirin -ibuprofen -ketoprofen -naproxen This list may not describe all possible interactions. Give your health care provider a list of all the medicines, herbs, non-prescription drugs, or dietary supplements you use. Also tell them if you smoke, drink alcohol, or use illegal drugs. Some items may interact with your medicine. What should I watch for while using this medicine? Visit your doctor for checks on your progress. This drug may make you feel generally unwell. This is not uncommon, as chemotherapy can affect healthy cells as well as cancer cells. Report any side effects. Continue your course of treatment even though you feel ill unless your doctor tells you to stop. In some cases, you may be given additional medicines to help with side effects. Follow all directions for their use. Call your doctor or health care professional for advice if you get a fever, chills or sore throat, or other symptoms of a cold or flu. Do not treat yourself. This drug decreases your body's ability to fight infections. Try to avoid being around people who are sick. This medicine may increase your risk to bruise or bleed. Call your doctor or health care professional if you notice any unusual bleeding. Be careful brushing and flossing your teeth or using a toothpick because you may get an infection or bleed more easily. If you have any dental work done, tell your dentist you are receiving this medicine. Avoid taking products that contain aspirin, acetaminophen, ibuprofen, naproxen, or ketoprofen unless instructed by your doctor. These medicines may hide a fever. Call your doctor or health care professional if you get diarrhea or mouth sores. Do not treat   yourself. To protect your kidneys, drink water or other fluids as directed while you are taking this medicine. Men and women must use effective birth control while taking this  medicine. You may also need to continue using effective birth control for a time after stopping this medicine. Do not become pregnant while taking this medicine. Tell your doctor right away if you think that you or your partner might be pregnant. There is a potential for serious side effects to an unborn child. Talk to your health care professional or pharmacist for more information. Do not breast-feed an infant while taking this medicine. This medicine may lower sperm counts. What side effects may I notice from receiving this medicine? Side effects that you should report to your doctor or health care professional as soon as possible: -allergic reactions like skin rash, itching or hives, swelling of the face, lips, or tongue -low blood counts - this medicine may decrease the number of white blood cells, red blood cells and platelets. You may be at increased risk for infections and bleeding. -signs of infection - fever or chills, cough, sore throat, pain or difficulty passing urine -signs of decreased platelets or bleeding - bruising, pinpoint red spots on the skin, black, tarry stools, blood in the urine -signs of decreased red blood cells - unusually weak or tired, fainting spells, lightheadedness -breathing problems, like a dry cough -changes in emotions or moods -chest pain -confusion -diarrhea -high blood pressure -mouth or throat sores or ulcers -pain, swelling, warmth in the leg -pain on swallowing -swelling of the ankles, feet, hands -trouble passing urine or change in the amount of urine -vomiting -yellowing of the eyes or skin Side effects that usually do not require medical attention (report to your doctor or health care professional if they continue or are bothersome): -hair loss -loss of appetite -nausea -stomach upset This list may not describe all possible side effects. Call your doctor for medical advice about side effects. You may report side effects to FDA at  1-800-FDA-1088. Where should I keep my medicine? This drug is given in a hospital or clinic and will not be stored at home. NOTE: This sheet is a summary. It may not cover all possible information. If you have questions about this medicine, talk to your doctor, pharmacist, or health care provider.  2015, [employer]vier/Gold Standard. (2007-12-22 13:24:03)  

## 2014-12-14 NOTE — Telephone Encounter (Signed)
Per staff message and POF I have scheduled appts. Advised scheduler of appts. JMW  

## 2015-01-03 ENCOUNTER — Other Ambulatory Visit: Payer: Self-pay | Admitting: Medical Oncology

## 2015-01-03 DIAGNOSIS — Z5111 Encounter for antineoplastic chemotherapy: Secondary | ICD-10-CM

## 2015-01-04 ENCOUNTER — Ambulatory Visit (HOSPITAL_BASED_OUTPATIENT_CLINIC_OR_DEPARTMENT_OTHER): Payer: Medicare Other

## 2015-01-04 ENCOUNTER — Ambulatory Visit (HOSPITAL_BASED_OUTPATIENT_CLINIC_OR_DEPARTMENT_OTHER): Payer: Medicare Other | Admitting: Physician Assistant

## 2015-01-04 ENCOUNTER — Encounter: Payer: Self-pay | Admitting: Physician Assistant

## 2015-01-04 ENCOUNTER — Telehealth: Payer: Self-pay | Admitting: Physician Assistant

## 2015-01-04 ENCOUNTER — Telehealth: Payer: Self-pay | Admitting: *Deleted

## 2015-01-04 ENCOUNTER — Other Ambulatory Visit (HOSPITAL_BASED_OUTPATIENT_CLINIC_OR_DEPARTMENT_OTHER): Payer: Medicare Other

## 2015-01-04 VITALS — BP 125/71 | HR 70 | Temp 98.2°F | Resp 18 | Ht 62.0 in | Wt 147.5 lb

## 2015-01-04 DIAGNOSIS — Z5111 Encounter for antineoplastic chemotherapy: Secondary | ICD-10-CM

## 2015-01-04 DIAGNOSIS — C3412 Malignant neoplasm of upper lobe, left bronchus or lung: Secondary | ICD-10-CM

## 2015-01-04 DIAGNOSIS — C3490 Malignant neoplasm of unspecified part of unspecified bronchus or lung: Secondary | ICD-10-CM

## 2015-01-04 DIAGNOSIS — C3431 Malignant neoplasm of lower lobe, right bronchus or lung: Secondary | ICD-10-CM

## 2015-01-04 LAB — CBC WITH DIFFERENTIAL/PLATELET
BASO%: 0.6 % (ref 0.0–2.0)
BASOS ABS: 0 10*3/uL (ref 0.0–0.1)
EOS%: 1.6 % (ref 0.0–7.0)
Eosinophils Absolute: 0.1 10*3/uL (ref 0.0–0.5)
HCT: 37.9 % (ref 34.8–46.6)
HGB: 12.6 g/dL (ref 11.6–15.9)
LYMPH%: 42.7 % (ref 14.0–49.7)
MCH: 33 pg (ref 25.1–34.0)
MCHC: 33.2 g/dL (ref 31.5–36.0)
MCV: 99.2 fL (ref 79.5–101.0)
MONO#: 0.4 10*3/uL (ref 0.1–0.9)
MONO%: 13.5 % (ref 0.0–14.0)
NEUT%: 41.6 % (ref 38.4–76.8)
NEUTROS ABS: 1.4 10*3/uL — AB (ref 1.5–6.5)
PLATELETS: 233 10*3/uL (ref 145–400)
RBC: 3.82 10*6/uL (ref 3.70–5.45)
RDW: 14.4 % (ref 11.2–14.5)
WBC: 3.3 10*3/uL — ABNORMAL LOW (ref 3.9–10.3)
lymph#: 1.4 10*3/uL (ref 0.9–3.3)

## 2015-01-04 LAB — COMPREHENSIVE METABOLIC PANEL (CC13)
ALT: 12 U/L (ref 0–55)
AST: 23 U/L (ref 5–34)
Albumin: 3.4 g/dL — ABNORMAL LOW (ref 3.5–5.0)
Alkaline Phosphatase: 84 U/L (ref 40–150)
Anion Gap: 6 mEq/L (ref 3–11)
BUN: 11.9 mg/dL (ref 7.0–26.0)
CHLORIDE: 109 meq/L (ref 98–109)
CO2: 25 meq/L (ref 22–29)
CREATININE: 0.8 mg/dL (ref 0.6–1.1)
Calcium: 8.9 mg/dL (ref 8.4–10.4)
EGFR: 73 mL/min/{1.73_m2} — ABNORMAL LOW (ref >90)
Glucose: 86 mg/dl (ref 70–140)
POTASSIUM: 4 meq/L (ref 3.5–5.1)
Sodium: 140 mEq/L (ref 136–145)
Total Bilirubin: 0.63 mg/dL (ref 0.20–1.20)
Total Protein: 6.7 g/dL (ref 6.4–8.3)

## 2015-01-04 MED ORDER — SODIUM CHLORIDE 0.9 % IV SOLN
500.0000 mg/m2 | Freq: Once | INTRAVENOUS | Status: AC
  Administered 2015-01-04: 850 mg via INTRAVENOUS
  Filled 2015-01-04: qty 34

## 2015-01-04 MED ORDER — SODIUM CHLORIDE 0.9 % IJ SOLN
10.0000 mL | INTRAMUSCULAR | Status: DC | PRN
  Administered 2015-01-04: 10 mL
  Filled 2015-01-04: qty 10

## 2015-01-04 MED ORDER — SODIUM CHLORIDE 0.9 % IV SOLN
Freq: Once | INTRAVENOUS | Status: AC
  Administered 2015-01-04: 12:00:00 via INTRAVENOUS

## 2015-01-04 MED ORDER — HEPARIN SOD (PORK) LOCK FLUSH 100 UNIT/ML IV SOLN
500.0000 [IU] | Freq: Once | INTRAVENOUS | Status: AC | PRN
  Administered 2015-01-04: 500 [IU]
  Filled 2015-01-04: qty 5

## 2015-01-04 MED ORDER — SODIUM CHLORIDE 0.9 % IV SOLN
Freq: Once | INTRAVENOUS | Status: AC
  Administered 2015-01-04: 12:00:00 via INTRAVENOUS
  Filled 2015-01-04: qty 8

## 2015-01-04 NOTE — Patient Instructions (Signed)
[facility] Discharge Instructions for Patients Receiving Chemotherapy  Today you received the following chemotherapy agents Alimta  To help prevent nausea and vomiting after your treatment, we encourage you to take your nausea medication   If you develop nausea and vomiting that is not controlled by your nausea medication, call the clinic.   BELOW ARE SYMPTOMS THAT SHOULD BE REPORTED IMMEDIATELY:  *FEVER GREATER THAN 100.5 F  *CHILLS WITH OR WITHOUT FEVER  NAUSEA AND VOMITING THAT IS NOT CONTROLLED WITH YOUR NAUSEA MEDICATION  *UNUSUAL SHORTNESS OF BREATH  *UNUSUAL BRUISING OR BLEEDING  TENDERNESS IN MOUTH AND THROAT WITH OR WITHOUT PRESENCE OF ULCERS  *URINARY PROBLEMS  *BOWEL PROBLEMS  UNUSUAL RASH Items with * indicate a potential emergency and should be followed up as soon as possible.  Feel free to call the clinic you have any questions or concerns. The clinic phone number is [phone].  Please show the CHEMO ALERT CARD at check-in to the Emergency Department and triage nurse.   

## 2015-01-04 NOTE — Telephone Encounter (Signed)
Per staff message and POF I have scheduled appts. Advised scheduler of appts. JMW  

## 2015-01-04 NOTE — Patient Instructions (Signed)
Follow-up in 3 weeks prior to next scheduled cycle of chemotherapy

## 2015-01-04 NOTE — Telephone Encounter (Signed)
per pof to sch pt appt w/MM-gave [pt copy og avs-sent MW email to sch pt trmt-pt aware of sch

## 2015-01-04 NOTE — Progress Notes (Addendum)
[facility] Telephone:([phone]   Fax:(336) [place], MD [place] [address]  Principle Diagnosis and stage: Recurrent non-small cell lung cancer, initially diagnosed a stage IB (T2, N0, M0) adenocarcinoma in June 2008 with a tumor size of 8 cm.   Prior Therapy:  #1 status post left upper lobectomy with lymph node dissection under the care of Dr. Arlyce Dice on 06/29/2006.  #2 status post 4 cycles of adjuvant chemotherapy with cisplatin and Taxotere last dose given 08/01/2006.  #3 status post 6 cycles of systemic chemotherapy with carboplatin and Alimta for disease recurrence last dose given 12/12/2009.   Current therapy: Maintenance chemotherapy with Alimta at 500 mg per meter squared given every 3 weeks status post 70 cycles.  CHEMOTHERAPY INTENT: Palliative/maintenance.  CURRENT # OF CHEMOTHERAPY CYCLES: 71  CURRENT ANTIEMETICS: Zofran, dexamethasone and Compazine  CURRENT SMOKING STATUS: Never Smoker.   ORAL CHEMOTHERAPY AND CONSENT: None   CURRENT BISPHOSPHONATES USE: None  PAIN MANAGEMENT: None  NARCOTICS INDUCED CONSTIPATION: None  LIVING WILL AND CODE STATUS: has living will for no CODE BLUE.  INTERVAL HISTORY: [name] 81 y.o. female returns to the clinic today for follow up visit. The patient has no complaints today. She is tolerating her maintenance chemotherapy with single agent Alimta fairly well except for mild fatigue a few days after the chemotherapy. The patient denied having any significant nausea or vomiting. She has no fever or chills. The she denied having any significant chest pain, shortness of breath, or hemoptysis. She has no weight loss or night sweats. Her last restaging CT scan was performed on 10/26/2014. This is done without contrast. She presents today to proceed with cycle #71 of her chemotherapy with single agent Alimta.   MEDICAL HISTORY: Past Medical  History  Diagnosis Date  . History of migraine headaches   . Hypercholesterolemia   . lung ca dx'd 06/2006    rt and lt lung  . Lung cancer 3/14/1    right- Adenocarcinoma w/bronchioalveolar features  . FHx: chemotherapy 2008&2011    4 cycles cisplatin,taxotere,2008& carboplatin and Alimta 2011    ALLERGIES:  is allergic to simvastatin and iodinated diagnostic agents.  MEDICATIONS:  Current Outpatient Prescriptions  Medication Sig Dispense Refill  . Calcium Carbonate-Vit D-Min (CALTRATE PLUS PO) Take 500 mg by mouth 2 (two) times daily.      Marland Kitchen dexamethasone (DECADRON) 4 MG tablet Take 4 mg by mouth as directed. 1 tablet BID the day before, day of and day after chemo    . folic acid (FOLVITE) 1 MG tablet Take 1 mg by mouth daily.      . hydrocortisone 2.5 % cream     . Multiple Vitamins-Minerals (CENTRUM SILVER PO) Take 1 tablet by mouth daily.     . potassium gluconate 595 MG TABS Take 595 mg by mouth daily.    . prochlorperazine (COMPAZINE) 10 MG tablet Take 1 tablet (10 mg total) by mouth every 6 (six) hours as needed. 15 tablet 1  . vitamin B-12 (CYANOCOBALAMIN) 500 MCG tablet Take 1,500 mcg by mouth daily.      No current facility-administered medications for this visit.   Facility-Administered Medications Ordered in Other Visits  Medication Dose Route Frequency Provider Last Rate Last Dose  . sodium chloride 0.9 % injection 10 mL  10 mL Intracatheter PRN Curt Bears, MD   10 mL at 01/04/15 1249    REVIEW OF  SYSTEMS:  A comprehensive review of systems was negative except for: Constitutional: positive for fatigue   PHYSICAL EXAMINATION: General appearance: alert, cooperative and no distress Head: Normocephalic, without obvious abnormality, atraumatic Neck: no adenopathy Lymph nodes: Cervical, supraclavicular, and axillary nodes normal. Resp: clear to auscultation bilaterally Back: symmetric, no curvature. ROM normal. No CVA tenderness. Cardio: regular rate and rhythm,  S1, S2 normal, no murmur, click, rub or gallop GI: soft, non-tender; bowel sounds normal; no masses,  no organomegaly Extremities: extremities normal, atraumatic, no cyanosis or edema Neurologic: Alert and oriented X 3, normal strength and tone. Normal symmetric reflexes. Normal coordination and gait  ECOG PERFORMANCE STATUS: 0 - Asymptomatic  Blood pressure 125/71, pulse 70, temperature 98.2 F (36.8 C), temperature source Oral, resp. rate 18, height '5\' 2"'$  (1.575 m), weight 147 lb 8 oz (66.906 kg), SpO2 100 %.  LABORATORY DATA: Lab Results  Component Value Date   WBC 3.3* 01/04/2015   HGB 12.6 01/04/2015   HCT 37.9 01/04/2015   MCV 99.2 01/04/2015   PLT 233 01/04/2015      Chemistry      Component Value Date/Time   NA 140 01/04/2015 1010   NA 139 01/24/2012 1008   NA 140 08/21/2011 1306   K 4.0 01/04/2015 1010   K 4.2 01/24/2012 1008   K 4.1 08/21/2011 1306   CL 108* 11/04/2012 0947   CL 103 01/24/2012 1008   CL 107 08/21/2011 1306   CO2 25 01/04/2015 1010   CO2 28 01/24/2012 1008   CO2 22 08/21/2011 1306   BUN 11.9 01/04/2015 1010   BUN 13 01/24/2012 1008   BUN 11 08/21/2011 1306   CREATININE 0.8 01/04/2015 1010   CREATININE 0.8 01/24/2012 1008   CREATININE 0.81 08/21/2011 1306      Component Value Date/Time   CALCIUM 8.9 01/04/2015 1010   CALCIUM 8.6 01/24/2012 1008   CALCIUM 8.8 08/21/2011 1306   ALKPHOS 84 01/04/2015 1010   ALKPHOS 79 01/24/2012 1008   ALKPHOS 87 08/21/2011 1306   AST 23 01/04/2015 1010   AST 24 01/24/2012 1008   AST 17 08/21/2011 1306   ALT 12 01/04/2015 1010   ALT 21 01/24/2012 1008   ALT 8 08/21/2011 1306   BILITOT 0.63 01/04/2015 1010   BILITOT 0.90 01/24/2012 1008   BILITOT 0.6 08/21/2011 1306       RADIOGRAPHIC STUDIES: No results found. ASSESSMENT AND PLAN: This is a very pleasant 81 years old white female with recurrent non-small cell lung cancer, adenocarcinoma. The patient is currently undergoing maintenance chemotherapy  with single agent Alimta status post 70 cycles and she is tolerating it fairly well except for mild fatigue.  I recommended for the patient to proceed with her maintenance chemotherapy with single agent Alimta as scheduled. We'll proceed today with cycle #71 as scheduled. We have changed the frequency of her restaging CT scans from after every 3 cycles to after every 4 cycles. She will need to be scheduled for her next restaging CT scan when she returns in 3 weeks prior to the start of cycle #72. Patient was discussed with and also seen by Dr. Julien Nordmann. She was advised to call immediately if she has any concerning symptoms in the interval.  Disclaimer: This note was dictated with voice recognition software. Similar sounding words can inadvertently be transcribed and may not be corrected upon review.  Carlton Adam, [name] 01/04/2015  ADDENDUM: Hematology/Oncology Attending: I had a face to face encounter with the patient. I  recommended her care plan. This is a very pleasant 81 years old white female with recurrent non-small cell lung cancer who is currently undergoing maintenance systemic chemotherapy with single agent Alimta status post 70 cycles. The patient has been tolerating her treatment well with no significant adverse effects. She will proceed with cycle #71 today as scheduled. The patient would come back for follow-up visit in 3 weeks for reevaluation before starting the next cycle of her systemic chemotherapy. She was advised to call immediately if she has any concerning symptoms in the interval.  Disclaimer: This note was dictated with voice recognition software. Similar sounding words can inadvertently be transcribed and may be missed upon review. [name]., MD 01/04/2015

## 2015-01-25 ENCOUNTER — Ambulatory Visit (HOSPITAL_BASED_OUTPATIENT_CLINIC_OR_DEPARTMENT_OTHER): Payer: Medicare Other

## 2015-01-25 ENCOUNTER — Other Ambulatory Visit (HOSPITAL_BASED_OUTPATIENT_CLINIC_OR_DEPARTMENT_OTHER): Payer: Medicare Other

## 2015-01-25 ENCOUNTER — Ambulatory Visit (HOSPITAL_BASED_OUTPATIENT_CLINIC_OR_DEPARTMENT_OTHER): Payer: Medicare Other | Admitting: Internal Medicine

## 2015-01-25 ENCOUNTER — Encounter: Payer: Self-pay | Admitting: Internal Medicine

## 2015-01-25 ENCOUNTER — Ambulatory Visit: Payer: Medicare Other | Admitting: Physician Assistant

## 2015-01-25 VITALS — BP 127/66 | HR 66 | Temp 98.4°F | Resp 17 | Ht 62.0 in | Wt 146.2 lb

## 2015-01-25 DIAGNOSIS — Z5111 Encounter for antineoplastic chemotherapy: Secondary | ICD-10-CM

## 2015-01-25 DIAGNOSIS — C3490 Malignant neoplasm of unspecified part of unspecified bronchus or lung: Secondary | ICD-10-CM

## 2015-01-25 DIAGNOSIS — C3431 Malignant neoplasm of lower lobe, right bronchus or lung: Secondary | ICD-10-CM

## 2015-01-25 DIAGNOSIS — C3412 Malignant neoplasm of upper lobe, left bronchus or lung: Secondary | ICD-10-CM

## 2015-01-25 LAB — CBC WITH DIFFERENTIAL/PLATELET
BASO%: 0.3 % (ref 0.0–2.0)
Basophils Absolute: 0 10*3/uL (ref 0.0–0.1)
EOS%: 1.6 % (ref 0.0–7.0)
Eosinophils Absolute: 0.1 10*3/uL (ref 0.0–0.5)
HCT: 36.9 % (ref 34.8–46.6)
HGB: 12.3 g/dL (ref 11.6–15.9)
LYMPH#: 1.3 10*3/uL (ref 0.9–3.3)
LYMPH%: 40.8 % (ref 14.0–49.7)
MCH: 33 pg (ref 25.1–34.0)
MCHC: 33.3 g/dL (ref 31.5–36.0)
MCV: 98.9 fL (ref 79.5–101.0)
MONO#: 0.5 10*3/uL (ref 0.1–0.9)
MONO%: 15.9 % — ABNORMAL HIGH (ref 0.0–14.0)
NEUT%: 41.4 % (ref 38.4–76.8)
NEUTROS ABS: 1.3 10*3/uL — AB (ref 1.5–6.5)
PLATELETS: 232 10*3/uL (ref 145–400)
RBC: 3.73 10*6/uL (ref 3.70–5.45)
RDW: 14.6 % — ABNORMAL HIGH (ref 11.2–14.5)
WBC: 3.1 10*3/uL — AB (ref 3.9–10.3)

## 2015-01-25 LAB — COMPREHENSIVE METABOLIC PANEL (CC13)
ALT: 13 U/L (ref 0–55)
ANION GAP: 9 meq/L (ref 3–11)
AST: 23 U/L (ref 5–34)
Albumin: 3.3 g/dL — ABNORMAL LOW (ref 3.5–5.0)
Alkaline Phosphatase: 84 U/L (ref 40–150)
BUN: 10.5 mg/dL (ref 7.0–26.0)
CHLORIDE: 109 meq/L (ref 98–109)
CO2: 23 mEq/L (ref 22–29)
Calcium: 8.9 mg/dL (ref 8.4–10.4)
Creatinine: 0.8 mg/dL (ref 0.6–1.1)
EGFR: 65 mL/min/{1.73_m2} — ABNORMAL LOW (ref >90)
Glucose: 92 mg/dl (ref 70–140)
Potassium: 3.5 mEq/L (ref 3.5–5.1)
Sodium: 141 mEq/L (ref 136–145)
TOTAL PROTEIN: 6.6 g/dL (ref 6.4–8.3)
Total Bilirubin: 0.56 mg/dL (ref 0.20–1.20)

## 2015-01-25 MED ORDER — SODIUM CHLORIDE 0.9 % IJ SOLN
10.0000 mL | INTRAMUSCULAR | Status: DC | PRN
  Administered 2015-01-25: 10 mL
  Filled 2015-01-25: qty 10

## 2015-01-25 MED ORDER — SODIUM CHLORIDE 0.9 % IV SOLN
Freq: Once | INTRAVENOUS | Status: AC
  Administered 2015-01-25: 12:00:00 via INTRAVENOUS

## 2015-01-25 MED ORDER — HEPARIN SOD (PORK) LOCK FLUSH 100 UNIT/ML IV SOLN
500.0000 [IU] | Freq: Once | INTRAVENOUS | Status: AC | PRN
  Administered 2015-01-25: 500 [IU]
  Filled 2015-01-25: qty 5

## 2015-01-25 MED ORDER — SODIUM CHLORIDE 0.9 % IV SOLN
500.0000 mg/m2 | Freq: Once | INTRAVENOUS | Status: AC
  Administered 2015-01-25: 850 mg via INTRAVENOUS
  Filled 2015-01-25: qty 34

## 2015-01-25 MED ORDER — SODIUM CHLORIDE 0.9 % IV SOLN
Freq: Once | INTRAVENOUS | Status: AC
  Administered 2015-01-25: 12:00:00 via INTRAVENOUS
  Filled 2015-01-25: qty 8

## 2015-01-25 NOTE — Patient Instructions (Signed)
[facility] Discharge Instructions for Patients Receiving Chemotherapy  Today you received the following chemotherapy agents Alimta  To help prevent nausea and vomiting after your treatment, we encourage you to take your nausea medication   If you develop nausea and vomiting that is not controlled by your nausea medication, call the clinic.   BELOW ARE SYMPTOMS THAT SHOULD BE REPORTED IMMEDIATELY:  *FEVER GREATER THAN 100.5 F  *CHILLS WITH OR WITHOUT FEVER  NAUSEA AND VOMITING THAT IS NOT CONTROLLED WITH YOUR NAUSEA MEDICATION  *UNUSUAL SHORTNESS OF BREATH  *UNUSUAL BRUISING OR BLEEDING  TENDERNESS IN MOUTH AND THROAT WITH OR WITHOUT PRESENCE OF ULCERS  *URINARY PROBLEMS  *BOWEL PROBLEMS  UNUSUAL RASH Items with * indicate a potential emergency and should be followed up as soon as possible.  Feel free to call the clinic you have any questions or concerns. The clinic phone number is [phone].  Please show the CHEMO ALERT CARD at check-in to the Emergency Department and triage nurse.   

## 2015-01-25 NOTE — Progress Notes (Signed)
Verball consent given by Dr. Earlie Server to treat with ABS Neu at 1.3

## 2015-01-25 NOTE — Progress Notes (Signed)
[place] [phone]   Fax:(336) [place], [place] [address]  Principle Diagnosis and stage: Recurrent non-small cell lung cancer, initially diagnosed a stage IB (T2, N0, M0) adenocarcinoma in June 2008 with a tumor size of 8 cm.   Prior Therapy:  #1 status post left upper lobectomy with lymph node dissection under the care of Dr. Arlyce Dice on 06/29/2006.  #2 status post 4 cycles of adjuvant chemotherapy with cisplatin and Taxotere last dose given 08/01/2006.  #3 status post 6 cycles of systemic chemotherapy with carboplatin and Alimta for disease recurrence last dose given 12/12/2009.   Current therapy: Maintenance chemotherapy with Alimta at 500 mg per meter squared given every 3 weeks status post 71 cycles.  CHEMOTHERAPY INTENT: Palliative/maintenance.  CURRENT # OF CHEMOTHERAPY CYCLES: 72  CURRENT ANTIEMETICS: Zofran, dexamethasone and Compazine  CURRENT SMOKING STATUS: Never Smoker.   ORAL CHEMOTHERAPY AND CONSENT: None   CURRENT BISPHOSPHONATES USE: None  PAIN MANAGEMENT: None  NARCOTICS INDUCED CONSTIPATION: None  LIVING WILL AND CODE STATUS: has living will for no CODE BLUE.  INTERVAL HISTORY: ANHELICA FOWERS 81 y.o. female returns to the clinic today for follow up visit. The patient has no complaints today. She is tolerating her maintenance chemotherapy with single agent Alimta fairly well. The patient denied having any significant nausea or vomiting. She has no fever or chills. The she denied having any significant chest pain, shortness of breath, or hemoptysis. She has no weight loss or night sweats. She is here for evaluation and discussion of her scan results before starting cycle #72.   MEDICAL HISTORY: Past Medical History  Diagnosis Date  . History of migraine headaches   . Hypercholesterolemia   . lung ca dx'd 06/2006    rt and lt lung  . Lung cancer 3/14/1      right- Adenocarcinoma w/bronchioalveolar features  . FHx: chemotherapy 2008&2011    4 cycles cisplatin,taxotere,2008& carboplatin and Alimta 2011    ALLERGIES:  is allergic to simvastatin and iodinated diagnostic agents.  MEDICATIONS:  Current Outpatient Prescriptions  Medication Sig Dispense Refill  . Calcium Carbonate-Vit D-Min (CALTRATE PLUS PO) Take 500 mg by mouth 2 (two) times daily.      . folic acid (FOLVITE) 1 MG tablet Take 1 mg by mouth daily.      . hydrocortisone 2.5 % cream     . Multiple Vitamins-Minerals (CENTRUM SILVER PO) Take 1 tablet by mouth daily.     . potassium gluconate 595 MG TABS Take 595 mg by mouth daily.    . vitamin B-12 (CYANOCOBALAMIN) 500 MCG tablet Take 1,500 mcg by mouth daily.     . prochlorperazine (COMPAZINE) 10 MG tablet Take 1 tablet (10 mg total) by mouth every 6 (six) hours as needed. (Patient not taking: Reported on 01/25/2015) 15 tablet 1   No current facility-administered medications for this visit.    REVIEW OF SYSTEMS:  A comprehensive review of systems was negative.   PHYSICAL EXAMINATION: General appearance: alert, cooperative and no distress Head: Normocephalic, without obvious abnormality, atraumatic Neck: no adenopathy Lymph nodes: Cervical, supraclavicular, and axillary nodes normal. Resp: clear to auscultation bilaterally Back: symmetric, no curvature. ROM normal. No CVA tenderness. Cardio: regular rate and rhythm, S1, S2 normal, no murmur, click, rub or gallop GI: soft, non-tender; bowel sounds normal; no masses,  no organomegaly Extremities: extremities normal, atraumatic, no cyanosis or edema Neurologic: Alert and  oriented X 3, normal strength and tone. Normal symmetric reflexes. Normal coordination and gait  ECOG PERFORMANCE STATUS: 0 - Asymptomatic  Blood pressure 127/66, pulse 66, temperature 98.4 F (36.9 C), resp. rate 17, height '5\' 2"'$  (1.575 m), weight 146 lb 3.2 oz (66.316 kg), SpO2 100 %.  LABORATORY  DATA: Lab Results  Component Value Date   WBC 3.1* 01/25/2015   HGB 12.3 01/25/2015   HCT 36.9 01/25/2015   MCV 98.9 01/25/2015   PLT 232 01/25/2015      Chemistry      Component Value Date/Time   NA 141 01/25/2015 0940   NA 139 01/24/2012 1008   NA 140 08/21/2011 1306   K 3.5 01/25/2015 0940   K 4.2 01/24/2012 1008   K 4.1 08/21/2011 1306   CL 108* 11/04/2012 0947   CL 103 01/24/2012 1008   CL 107 08/21/2011 1306   CO2 23 01/25/2015 0940   CO2 28 01/24/2012 1008   CO2 22 08/21/2011 1306   BUN 10.5 01/25/2015 0940   BUN 13 01/24/2012 1008   BUN 11 08/21/2011 1306   CREATININE 0.8 01/25/2015 0940   CREATININE 0.8 01/24/2012 1008   CREATININE 0.81 08/21/2011 1306      Component Value Date/Time   CALCIUM 8.9 01/25/2015 0940   CALCIUM 8.6 01/24/2012 1008   CALCIUM 8.8 08/21/2011 1306   ALKPHOS 84 01/25/2015 0940   ALKPHOS 79 01/24/2012 1008   ALKPHOS 87 08/21/2011 1306   AST 23 01/25/2015 0940   AST 24 01/24/2012 1008   AST 17 08/21/2011 1306   ALT 13 01/25/2015 0940   ALT 21 01/24/2012 1008   ALT 8 08/21/2011 1306   BILITOT 0.56 01/25/2015 0940   BILITOT 0.90 01/24/2012 1008   BILITOT 0.6 08/21/2011 1306       RADIOGRAPHIC STUDIES: No results found. ASSESSMENT AND PLAN: This is a very pleasant 81 years old white female with recurrent non-small cell lung cancer, adenocarcinoma. The patient is currently undergoing maintenance chemotherapy with single agent Alimta status post 71 cycles and she is tolerating it fairly well except for mild fatigue.  I recommended for the patient to proceed with her maintenance chemotherapy with single agent Alimta as scheduled. We'll proceed today with cycle #72 as scheduled.  She will come back for follow-up visit in 3 weeks with the next cycle of her treatment after repeating CT scan of the chest for restaging of her disease. She was advised to call immediately if she has any concerning symptoms in the interval.  Disclaimer: This  note was dictated with voice recognition software. Similar sounding words can inadvertently be transcribed and may not be corrected upon review.  [name]., MD 01/25/2015

## 2015-01-26 ENCOUNTER — Other Ambulatory Visit: Payer: Self-pay | Admitting: *Deleted

## 2015-01-26 DIAGNOSIS — C3490 Malignant neoplasm of unspecified part of unspecified bronchus or lung: Secondary | ICD-10-CM

## 2015-01-26 NOTE — Progress Notes (Signed)
New order for CT chest entered due to allergy to contrast

## 2015-02-14 ENCOUNTER — Ambulatory Visit (HOSPITAL_COMMUNITY)
Admission: RE | Admit: 2015-02-14 | Discharge: 2015-02-14 | Disposition: A | Discharge status: Home / Self Care | Payer: Medicare Other | Source: Ambulatory Visit | Attending: Internal Medicine | Admitting: Internal Medicine

## 2015-02-14 ENCOUNTER — Encounter (HOSPITAL_COMMUNITY): Payer: Self-pay

## 2015-02-14 DIAGNOSIS — C3490 Malignant neoplasm of unspecified part of unspecified bronchus or lung: Secondary | ICD-10-CM | POA: Diagnosis present

## 2015-02-15 ENCOUNTER — Ambulatory Visit (HOSPITAL_BASED_OUTPATIENT_CLINIC_OR_DEPARTMENT_OTHER): Payer: Medicare Other

## 2015-02-15 ENCOUNTER — Encounter: Payer: Self-pay | Admitting: Internal Medicine

## 2015-02-15 ENCOUNTER — Telehealth: Payer: Self-pay | Admitting: Internal Medicine

## 2015-02-15 ENCOUNTER — Other Ambulatory Visit (HOSPITAL_BASED_OUTPATIENT_CLINIC_OR_DEPARTMENT_OTHER): Payer: Medicare Other

## 2015-02-15 ENCOUNTER — Ambulatory Visit (HOSPITAL_BASED_OUTPATIENT_CLINIC_OR_DEPARTMENT_OTHER): Payer: Medicare Other | Admitting: Internal Medicine

## 2015-02-15 VITALS — BP 119/72 | HR 72 | Temp 98.7°F | Resp 18 | Ht 62.0 in | Wt 145.9 lb

## 2015-02-15 DIAGNOSIS — C3492 Malignant neoplasm of unspecified part of left bronchus or lung: Secondary | ICD-10-CM

## 2015-02-15 DIAGNOSIS — C3412 Malignant neoplasm of upper lobe, left bronchus or lung: Secondary | ICD-10-CM

## 2015-02-15 DIAGNOSIS — C3431 Malignant neoplasm of lower lobe, right bronchus or lung: Secondary | ICD-10-CM

## 2015-02-15 DIAGNOSIS — C3491 Malignant neoplasm of unspecified part of right bronchus or lung: Secondary | ICD-10-CM

## 2015-02-15 DIAGNOSIS — Z5111 Encounter for antineoplastic chemotherapy: Secondary | ICD-10-CM

## 2015-02-15 DIAGNOSIS — C3490 Malignant neoplasm of unspecified part of unspecified bronchus or lung: Secondary | ICD-10-CM

## 2015-02-15 LAB — CBC WITH DIFFERENTIAL/PLATELET
BASO%: 0.7 % (ref 0.0–2.0)
Basophils Absolute: 0 10*3/uL (ref 0.0–0.1)
EOS ABS: 0.1 10*3/uL (ref 0.0–0.5)
EOS%: 2.1 % (ref 0.0–7.0)
HCT: 37.2 % (ref 34.8–46.6)
HEMOGLOBIN: 12.4 g/dL (ref 11.6–15.9)
LYMPH%: 45.1 % (ref 14.0–49.7)
MCH: 33.3 pg (ref 25.1–34.0)
MCHC: 33.5 g/dL (ref 31.5–36.0)
MCV: 99.6 fL (ref 79.5–101.0)
MONO#: 0.4 10*3/uL (ref 0.1–0.9)
MONO%: 14.4 % — AB (ref 0.0–14.0)
NEUT%: 37.7 % — ABNORMAL LOW (ref 38.4–76.8)
NEUTROS ABS: 1.2 10*3/uL — AB (ref 1.5–6.5)
Platelets: 271 10*3/uL (ref 145–400)
RBC: 3.73 10*6/uL (ref 3.70–5.45)
RDW: 15.3 % — AB (ref 11.2–14.5)
WBC: 3.1 10*3/uL — AB (ref 3.9–10.3)
lymph#: 1.4 10*3/uL (ref 0.9–3.3)

## 2015-02-15 LAB — COMPREHENSIVE METABOLIC PANEL (CC13)
ALBUMIN: 3.3 g/dL — AB (ref 3.5–5.0)
ALK PHOS: 83 U/L (ref 40–150)
ALT: 12 U/L (ref 0–55)
AST: 21 U/L (ref 5–34)
Anion Gap: 7 mEq/L (ref 3–11)
BUN: 9.2 mg/dL (ref 7.0–26.0)
CALCIUM: 8.9 mg/dL (ref 8.4–10.4)
CO2: 25 mEq/L (ref 22–29)
Chloride: 109 mEq/L (ref 98–109)
Creatinine: 0.8 mg/dL (ref 0.6–1.1)
EGFR: 68 mL/min/{1.73_m2} — ABNORMAL LOW (ref >90)
Glucose: 75 mg/dl (ref 70–140)
POTASSIUM: 3.9 meq/L (ref 3.5–5.1)
Sodium: 140 mEq/L (ref 136–145)
Total Bilirubin: 0.6 mg/dL (ref 0.20–1.20)
Total Protein: 6.8 g/dL (ref 6.4–8.3)

## 2015-02-15 MED ORDER — SODIUM CHLORIDE 0.9 % IV SOLN
Freq: Once | INTRAVENOUS | Status: AC
  Administered 2015-02-15: 11:00:00 via INTRAVENOUS

## 2015-02-15 MED ORDER — CYANOCOBALAMIN 1000 MCG/ML IJ SOLN
INTRAMUSCULAR | Status: AC
  Filled 2015-02-15: qty 1

## 2015-02-15 MED ORDER — SODIUM CHLORIDE 0.9 % IJ SOLN
10.0000 mL | INTRAMUSCULAR | Status: DC | PRN
  Administered 2015-02-15: 10 mL
  Filled 2015-02-15: qty 10

## 2015-02-15 MED ORDER — SODIUM CHLORIDE 0.9 % IV SOLN
Freq: Once | INTRAVENOUS | Status: AC
  Administered 2015-02-15: 11:00:00 via INTRAVENOUS
  Filled 2015-02-15: qty 8

## 2015-02-15 MED ORDER — SODIUM CHLORIDE 0.9 % IV SOLN
500.0000 mg/m2 | Freq: Once | INTRAVENOUS | Status: AC
  Administered 2015-02-15: 850 mg via INTRAVENOUS
  Filled 2015-02-15: qty 34

## 2015-02-15 MED ORDER — HEPARIN SOD (PORK) LOCK FLUSH 100 UNIT/ML IV SOLN
500.0000 [IU] | Freq: Once | INTRAVENOUS | Status: AC | PRN
  Administered 2015-02-15: 500 [IU]
  Filled 2015-02-15: qty 5

## 2015-02-15 MED ORDER — CYANOCOBALAMIN 1000 MCG/ML IJ SOLN
1000.0000 ug | Freq: Once | INTRAMUSCULAR | Status: AC
Start: 2015-02-15 — End: 2015-02-15
  Administered 2015-02-15: 1000 ug via INTRAMUSCULAR

## 2015-02-15 NOTE — Telephone Encounter (Signed)
Pt confirmed labs/ov per 09/14 POF, gave pt AVS and Calendar.... KJ, added Alimta to schedule.Marland Kitchen

## 2015-02-15 NOTE — Progress Notes (Signed)
[place] [phone]   Fax:(336) [place], [place] [address]  Principle Diagnosis and stage: Recurrent non-small cell lung cancer, initially diagnosed a stage IB (T2, N0, M0) adenocarcinoma in June 2008 with a tumor size of 8 cm.   Prior Therapy:  #1 status post left upper lobectomy with lymph node dissection under the care of Dr. Arlyce Dice on 06/29/2006.  #2 status post 4 cycles of adjuvant chemotherapy with cisplatin and Taxotere last dose given 08/01/2006.  #3 status post 6 cycles of systemic chemotherapy with carboplatin and Alimta for disease recurrence last dose given 12/12/2009.   Current therapy: Maintenance chemotherapy with Alimta at 500 mg per meter squared given every 3 weeks status post 72 cycles.  CHEMOTHERAPY INTENT: Palliative/maintenance.  CURRENT # OF CHEMOTHERAPY CYCLES: 73  CURRENT ANTIEMETICS: Zofran, dexamethasone and Compazine  CURRENT SMOKING STATUS: Never Smoker.   ORAL CHEMOTHERAPY AND CONSENT: None   CURRENT BISPHOSPHONATES USE: None  PAIN MANAGEMENT: None  NARCOTICS INDUCED CONSTIPATION: None  LIVING WILL AND CODE STATUS: has living will for no CODE BLUE.  INTERVAL HISTORY: [name] 81 y.o. female returns to the clinic today for follow up visit. The patient has no complaints today. She is tolerating her maintenance chemotherapy with single agent Alimta fairly well. The patient denied having any significant nausea or vomiting. She has no fever or chills. The she denied having any significant chest pain, shortness of breath, or hemoptysis. She has no weight loss or night sweats.  She had repeat CT scan of the chest and she is here for evaluation and discussion of her scan results before starting cycle #73.   MEDICAL HISTORY: Past Medical History  Diagnosis Date  . History of migraine headaches   . Hypercholesterolemia   . lung ca dx'd 06/2006      rt and lt lung  . Lung cancer 3/14/1    right- Adenocarcinoma w/bronchioalveolar features  . FHx: chemotherapy 2008&2011    4 cycles cisplatin,taxotere,2008& carboplatin and Alimta 2011    ALLERGIES:  is allergic to simvastatin and iodinated diagnostic agents.  MEDICATIONS:  Current Outpatient Prescriptions  Medication Sig Dispense Refill  . Calcium Carbonate-Vit D-Min (CALTRATE PLUS PO) Take 500 mg by mouth 2 (two) times daily.      . folic acid (FOLVITE) 1 MG tablet Take 1 mg by mouth daily.      . hydrocortisone 2.5 % cream     . Multiple Vitamins-Minerals (CENTRUM SILVER PO) Take 1 tablet by mouth daily.     . potassium gluconate 595 MG TABS Take 595 mg by mouth daily.    . vitamin B-12 (CYANOCOBALAMIN) 500 MCG tablet Take 1,500 mcg by mouth daily.     . prochlorperazine (COMPAZINE) 10 MG tablet Take 1 tablet (10 mg total) by mouth every 6 (six) hours as needed. (Patient not taking: Reported on 01/25/2015) 15 tablet 1   No current facility-administered medications for this visit.    REVIEW OF SYSTEMS:  Constitutional: negative Eyes: negative Ears, nose, mouth, throat, and face: negative Respiratory: negative Cardiovascular: negative Gastrointestinal: negative Genitourinary:negative Integument/breast: negative Hematologic/lymphatic: negative Musculoskeletal:negative Neurological: negative Behavioral/Psych: negative Endocrine: negative Allergic/Immunologic: negative   PHYSICAL EXAMINATION: General appearance: alert, cooperative and no distress Head: Normocephalic, without obvious abnormality, atraumatic Neck: no adenopathy Lymph nodes: Cervical, supraclavicular, and axillary nodes normal. Resp: clear to auscultation bilaterally Back: symmetric, no curvature. ROM normal. No CVA tenderness. Cardio: regular  rate and rhythm, S1, S2 normal, no murmur, click, rub or gallop GI: soft, non-tender; bowel sounds normal; no masses,  no organomegaly Extremities: extremities  normal, atraumatic, no cyanosis or edema Neurologic: Alert and oriented X 3, normal strength and tone. Normal symmetric reflexes. Normal coordination and gait  ECOG PERFORMANCE STATUS: 0 - Asymptomatic  Blood pressure 119/72, pulse 72, temperature 98.7 F (37.1 C), temperature source Oral, resp. rate 18, height '5\' 2"'$  (1.575 m), weight 145 lb 14.4 oz (66.18 kg), SpO2 100 %.  LABORATORY DATA: Lab Results  Component Value Date   WBC 3.1* 02/15/2015   HGB 12.4 02/15/2015   HCT 37.2 02/15/2015   MCV 99.6 02/15/2015   PLT 271 02/15/2015      Chemistry      Component Value Date/Time   NA 140 02/15/2015 0905   NA 139 01/24/2012 1008   NA 140 08/21/2011 1306   K 3.9 02/15/2015 0905   K 4.2 01/24/2012 1008   K 4.1 08/21/2011 1306   CL 108* 11/04/2012 0947   CL 103 01/24/2012 1008   CL 107 08/21/2011 1306   CO2 25 02/15/2015 0905   CO2 28 01/24/2012 1008   CO2 22 08/21/2011 1306   BUN 9.2 02/15/2015 0905   BUN 13 01/24/2012 1008   BUN 11 08/21/2011 1306   CREATININE 0.8 02/15/2015 0905   CREATININE 0.8 01/24/2012 1008   CREATININE 0.81 08/21/2011 1306      Component Value Date/Time   CALCIUM 8.9 02/15/2015 0905   CALCIUM 8.6 01/24/2012 1008   CALCIUM 8.8 08/21/2011 1306   ALKPHOS 83 02/15/2015 0905   ALKPHOS 79 01/24/2012 1008   ALKPHOS 87 08/21/2011 1306   AST 21 02/15/2015 0905   AST 24 01/24/2012 1008   AST 17 08/21/2011 1306   ALT 12 02/15/2015 0905   ALT 21 01/24/2012 1008   ALT 8 08/21/2011 1306   BILITOT 0.60 02/15/2015 0905   BILITOT 0.90 01/24/2012 1008   BILITOT 0.6 08/21/2011 1306       RADIOGRAPHIC STUDIES: Ct Chest Wo Contrast  02/14/2015   CLINICAL DATA:  Lung cancer. Left lower lobectomy. Chemotherapy still in progress.  EXAM: CT CHEST WITHOUT CONTRAST  TECHNIQUE: Multidetector CT imaging of the chest was performed following the standard protocol without IV contrast.  COMPARISON:  Multiple exams, including 10/26/2014  FINDINGS: Mediastinum/Nodes:  Coronary, aortic arch, and branch vessel atherosclerotic vascular disease. No pathologic thoracic adenopathy.  Lungs/Pleura: Basically stable appearance of right upper lobe mass and confluent airspace opacity, measuring 3.1 cm in short axis on image 20 of series 5, formerly 3.5 cm when measured in the same fashion. When measured on parasagittal images, the maximum thickness vertically is 2.9 cm, which is stable from prior. Aside from being equivocally reduced in size the appearance is similar, with bandlike opacity and air bronchograms tracking along the right middle lobe.  Inferior right middle lobe scarring measures 1 cm in thickness, previously 0.9 cm. Peripheral sub solid density in the left upper lobe measures 1.1 cm in thickness, stable.  No new or progressive mass.  Left lower lobectomy.  Upper abdomen: Unremarkable  Musculoskeletal: No new significant findings.  IMPRESSION: 1. Equivocal reduction in the size of the dominant right upper lobe lung mass and associated airspace opacity. 2. Stable scar-like densities in the right middle lobe and left upper lobe. 3. Coronary, aortic arch, and branch vessel atherosclerotic vascular disease.   Electronically Signed   By: Van Clines M.D.   On: 02/14/2015 16:27  ASSESSMENT AND PLAN: This is a very pleasant 81 years old white female with recurrent non-small cell lung cancer, adenocarcinoma. The patient is currently undergoing maintenance chemotherapy with single agent Alimta status post 72 cycles and she is tolerating it fairly well except for mild fatigue.  The recent CT scan of the chest showed no evidence for disease progression.  I discussed the scan results with the patient today. I recommended for the patient to proceed with her maintenance chemotherapy with single agent Alimta as scheduled. We'll proceed today with cycle #73 as scheduled.  She will come back for follow-up visit in 3 weeks with the next cycle of her treatment.  She was advised  to call immediately if she has any concerning symptoms in the interval.  Disclaimer: This note was dictated with voice recognition software. Similar sounding words can inadvertently be transcribed and may not be corrected upon review.  [name]., MD 02/15/2015

## 2015-02-15 NOTE — Patient Instructions (Signed)
[facility] Discharge Instructions for Patients Receiving Chemotherapy  Today you received the following chemotherapy agents Alimta.  To help prevent nausea and vomiting after your treatment, we encourage you to take your nausea medication as prescribed by your physician. If you develop nausea and vomiting that is not controlled by your nausea medication, call the clinic.   BELOW ARE SYMPTOMS THAT SHOULD BE REPORTED IMMEDIATELY:  *FEVER GREATER THAN 100.5 F  *CHILLS WITH OR WITHOUT FEVER  NAUSEA AND VOMITING THAT IS NOT CONTROLLED WITH YOUR NAUSEA MEDICATION  *UNUSUAL SHORTNESS OF BREATH  *UNUSUAL BRUISING OR BLEEDING  TENDERNESS IN MOUTH AND THROAT WITH OR WITHOUT PRESENCE OF ULCERS  *URINARY PROBLEMS  *BOWEL PROBLEMS  UNUSUAL RASH Items with * indicate a potential emergency and should be followed up as soon as possible.  Feel free to call the clinic you have any questions or concerns. The clinic phone number is [phone].  Please show the CHEMO ALERT CARD at check-in to the Emergency Department and triage nurse.   

## 2015-02-15 NOTE — Progress Notes (Signed)
Per Dr. Julien Nordmann, okay to tx with ANC 1.2

## 2015-03-08 ENCOUNTER — Ambulatory Visit (HOSPITAL_BASED_OUTPATIENT_CLINIC_OR_DEPARTMENT_OTHER): Payer: Medicare Other | Admitting: Nurse Practitioner

## 2015-03-08 ENCOUNTER — Ambulatory Visit (HOSPITAL_BASED_OUTPATIENT_CLINIC_OR_DEPARTMENT_OTHER): Payer: Medicare Other

## 2015-03-08 ENCOUNTER — Other Ambulatory Visit (HOSPITAL_BASED_OUTPATIENT_CLINIC_OR_DEPARTMENT_OTHER): Payer: Medicare Other

## 2015-03-08 ENCOUNTER — Telehealth: Payer: Self-pay | Admitting: Internal Medicine

## 2015-03-08 VITALS — BP 145/63 | HR 65 | Temp 97.7°F | Resp 18 | Ht 62.0 in | Wt 145.0 lb

## 2015-03-08 DIAGNOSIS — Z23 Encounter for immunization: Secondary | ICD-10-CM

## 2015-03-08 DIAGNOSIS — Z5111 Encounter for antineoplastic chemotherapy: Secondary | ICD-10-CM

## 2015-03-08 DIAGNOSIS — D709 Neutropenia, unspecified: Secondary | ICD-10-CM

## 2015-03-08 DIAGNOSIS — B371 Pulmonary candidiasis: Secondary | ICD-10-CM

## 2015-03-08 DIAGNOSIS — C3490 Malignant neoplasm of unspecified part of unspecified bronchus or lung: Secondary | ICD-10-CM

## 2015-03-08 DIAGNOSIS — C3492 Malignant neoplasm of unspecified part of left bronchus or lung: Principal | ICD-10-CM

## 2015-03-08 DIAGNOSIS — C3412 Malignant neoplasm of upper lobe, left bronchus or lung: Secondary | ICD-10-CM

## 2015-03-08 DIAGNOSIS — C3430 Malignant neoplasm of lower lobe, unspecified bronchus or lung: Secondary | ICD-10-CM

## 2015-03-08 DIAGNOSIS — C3491 Malignant neoplasm of unspecified part of right bronchus or lung: Secondary | ICD-10-CM

## 2015-03-08 DIAGNOSIS — C3431 Malignant neoplasm of lower lobe, right bronchus or lung: Secondary | ICD-10-CM

## 2015-03-08 LAB — CBC WITH DIFFERENTIAL/PLATELET
BASO%: 0.3 % (ref 0.0–2.0)
BASOS ABS: 0 10*3/uL (ref 0.0–0.1)
EOS ABS: 0 10*3/uL (ref 0.0–0.5)
EOS%: 1.3 % (ref 0.0–7.0)
HCT: 38 % (ref 34.8–46.6)
HGB: 12.5 g/dL (ref 11.6–15.9)
LYMPH%: 43.6 % (ref 14.0–49.7)
MCH: 32.9 pg (ref 25.1–34.0)
MCHC: 32.9 g/dL (ref 31.5–36.0)
MCV: 100 fL (ref 79.5–101.0)
MONO#: 0.4 10*3/uL (ref 0.1–0.9)
MONO%: 14.2 % — AB (ref 0.0–14.0)
NEUT%: 40.6 % (ref 38.4–76.8)
NEUTROS ABS: 1.2 10*3/uL — AB (ref 1.5–6.5)
PLATELETS: 243 10*3/uL (ref 145–400)
RBC: 3.8 10*6/uL (ref 3.70–5.45)
RDW: 14.9 % — ABNORMAL HIGH (ref 11.2–14.5)
WBC: 3 10*3/uL — ABNORMAL LOW (ref 3.9–10.3)
lymph#: 1.3 10*3/uL (ref 0.9–3.3)

## 2015-03-08 LAB — COMPREHENSIVE METABOLIC PANEL (CC13)
ALT: 11 U/L (ref 0–55)
ANION GAP: 5 meq/L (ref 3–11)
AST: 22 U/L (ref 5–34)
Albumin: 3.4 g/dL — ABNORMAL LOW (ref 3.5–5.0)
Alkaline Phosphatase: 79 U/L (ref 40–150)
BILIRUBIN TOTAL: 0.73 mg/dL (ref 0.20–1.20)
BUN: 12.8 mg/dL (ref 7.0–26.0)
CO2: 26 meq/L (ref 22–29)
Calcium: 9.2 mg/dL (ref 8.4–10.4)
Chloride: 110 mEq/L — ABNORMAL HIGH (ref 98–109)
Creatinine: 0.8 mg/dL (ref 0.6–1.1)
EGFR: 68 mL/min/{1.73_m2} — AB (ref >90)
Glucose: 91 mg/dl (ref 70–140)
POTASSIUM: 4.6 meq/L (ref 3.5–5.1)
SODIUM: 141 meq/L (ref 136–145)
TOTAL PROTEIN: 7 g/dL (ref 6.4–8.3)

## 2015-03-08 MED ORDER — SODIUM CHLORIDE 0.9 % IV SOLN
Freq: Once | INTRAVENOUS | Status: AC
  Administered 2015-03-08: 10:00:00 via INTRAVENOUS

## 2015-03-08 MED ORDER — HEPARIN SOD (PORK) LOCK FLUSH 100 UNIT/ML IV SOLN
500.0000 [IU] | Freq: Once | INTRAVENOUS | Status: AC | PRN
  Administered 2015-03-08: 500 [IU]
  Filled 2015-03-08: qty 5

## 2015-03-08 MED ORDER — SODIUM CHLORIDE 0.9 % IV SOLN
Freq: Once | INTRAVENOUS | Status: AC
  Administered 2015-03-08: 11:00:00 via INTRAVENOUS
  Filled 2015-03-08: qty 8

## 2015-03-08 MED ORDER — SODIUM CHLORIDE 0.9 % IJ SOLN
10.0000 mL | INTRAMUSCULAR | Status: DC | PRN
  Administered 2015-03-08: 10 mL
  Filled 2015-03-08: qty 10

## 2015-03-08 MED ORDER — INFLUENZA VAC SPLIT QUAD 0.5 ML IM SUSY
0.5000 mL | PREFILLED_SYRINGE | Freq: Once | INTRAMUSCULAR | Status: AC
  Administered 2015-03-08: 0.5 mL via INTRAMUSCULAR
  Filled 2015-03-08: qty 0.5

## 2015-03-08 MED ORDER — SODIUM CHLORIDE 0.9 % IV SOLN
500.0000 mg/m2 | Freq: Once | INTRAVENOUS | Status: AC
  Administered 2015-03-08: 850 mg via INTRAVENOUS
  Filled 2015-03-08: qty 34

## 2015-03-08 NOTE — Progress Notes (Addendum)
  [facility] OFFICE PROGRESS NOTE   Principle Diagnosis and stage: Recurrent non-small cell lung cancer, initially diagnosed a stage IB (T2, N0, M0) adenocarcinoma in June 2008 with a tumor size of 8 cm.   Prior Therapy:  #1 status post left upper lobectomy with lymph node dissection under the care of Dr. Arlyce Dice on 06/29/2006.  #2 status post 4 cycles of adjuvant chemotherapy with cisplatin and Taxotere last dose given 08/01/2006.  #3 status post 6 cycles of systemic chemotherapy with carboplatin and Alimta for disease recurrence last dose given 12/12/2009.   Current therapy: Maintenance chemotherapy with Alimta at 500 mg per meter squared given every 3 weeks status post 73 cycles.  CHEMOTHERAPY INTENT: Palliative/maintenance.  CURRENT # OF CHEMOTHERAPY CYCLES: 73    INTERVAL HISTORY:   [name] returns as scheduled. She completed cycle 73 maintenance Alimta 02/15/2015. She denies nausea/vomiting. No mouth sores. No diarrhea. Stable mild dyspnea on exertion. Cough is unchanged. No fever. She has a good appetite. She denies pain.  Objective:  Vital signs in last 24 hours:  Blood pressure 145/63, pulse 65, temperature 97.7 F (36.5 C), temperature source Oral, resp. rate 18, height '5\' 2"'$  (1.575 m), weight 145 lb (65.772 kg), SpO2 100 %.    HEENT: No thrush or ulcers. Resp: Lungs clear bilaterally. Cardio: Regular rate and rhythm. GI: Abdomen soft and nontender. No hepatomegaly. Vascular: No leg edema. Calves soft and nontender. Neuro: Alert and oriented. Gait normal.  Skin: No rash. Port-A-Cath without erythema.    Lab Results:  Lab Results  Component Value Date   WBC 3.0* 03/08/2015   HGB 12.5 03/08/2015   HCT 38.0 03/08/2015   MCV 100.0 03/08/2015   PLT 243 03/08/2015   NEUTROABS 1.2* 03/08/2015    Imaging:  No results found.  Medications: I have reviewed the patient's current medications.  Assessment/Plan: 1. Recurrent non-small cell  lung cancer, adenocarcinoma, on maintenance chemotherapy with single agent Alimta. Most recent restaging CT evaluation 02/14/2015 showed no evidence for disease progression. 2. Mild neutropenia. Stable.   Disposition: [name] appears stable. She has completed 73 cycles of maintenance Alimta. Plan to proceed with cycle 74 today as scheduled. She will return for a follow-up visit in 3 weeks. She will contact the office in the interim with any problems.  The influenza vaccine will be administered today.  Plan reviewed with Dr. Julien Nordmann.  Ned Card ANP/GNP-BC   03/08/2015  10:15 AM  ADDENDUM: Hematology/Oncology Attending: I had a face to face encounter with the patient today. I recommended her care plan. This is a very pleasant 81 years old white female with recurrent non-small cell lung cancer, adenocarcinoma was currently undergoing maintenance systemic chemotherapy with single agent Alimta status post 73 cycles. The patient is spent tolerating her treatment well with no significant adverse effects. I recommended for her to proceed with cycle #74 today as scheduled. She would come back for follow-up visit in 3 weeks for reevaluation before starting cycle #75. The patient was advised to call immediately if she has any concerning symptoms in the interval.  Disclaimer: This note was dictated with voice recognition software. Similar sounding words can inadvertently be transcribed and may be missed upon review. [name]., MD 03/08/2015

## 2015-03-08 NOTE — Patient Instructions (Signed)
[facility] Discharge Instructions for Patients Receiving Chemotherapy  Today you received the following chemotherapy agents; Alimta.   To help prevent nausea and vomiting after your treatment, we encourage you to take your nausea medication as directed.    If you develop nausea and vomiting that is not controlled by your nausea medication, call the clinic.   BELOW ARE SYMPTOMS THAT SHOULD BE REPORTED IMMEDIATELY:  *FEVER GREATER THAN 100.5 F  *CHILLS WITH OR WITHOUT FEVER  NAUSEA AND VOMITING THAT IS NOT CONTROLLED WITH YOUR NAUSEA MEDICATION  *UNUSUAL SHORTNESS OF BREATH  *UNUSUAL BRUISING OR BLEEDING  TENDERNESS IN MOUTH AND THROAT WITH OR WITHOUT PRESENCE OF ULCERS  *URINARY PROBLEMS  *BOWEL PROBLEMS  UNUSUAL RASH Items with * indicate a potential emergency and should be followed up as soon as possible.  Feel free to call the clinic you have any questions or concerns. The clinic phone number is [phone].  Please show the CHEMO ALERT CARD at check-in to the Emergency Department and triage nurse.   

## 2015-03-08 NOTE — Progress Notes (Signed)
Reviewed labs with Ned Card NP.  Okay to treat with ANC 1.2 per Ned Card NP

## 2015-03-08 NOTE — Telephone Encounter (Signed)
Added appt per pof...the patient will get new sched in chemo

## 2015-03-15 ENCOUNTER — Other Ambulatory Visit: Payer: Self-pay | Admitting: Nurse Practitioner

## 2015-03-15 ENCOUNTER — Telehealth: Payer: Self-pay | Admitting: Internal Medicine

## 2015-03-15 NOTE — Telephone Encounter (Signed)
patient aware of her new appointment time on 10/26

## 2015-03-29 ENCOUNTER — Telehealth: Payer: Self-pay | Admitting: Internal Medicine

## 2015-03-29 ENCOUNTER — Ambulatory Visit (HOSPITAL_BASED_OUTPATIENT_CLINIC_OR_DEPARTMENT_OTHER): Payer: Medicare Other | Admitting: Physician Assistant

## 2015-03-29 ENCOUNTER — Ambulatory Visit: Payer: Medicare Other | Admitting: Nurse Practitioner

## 2015-03-29 ENCOUNTER — Ambulatory Visit (HOSPITAL_BASED_OUTPATIENT_CLINIC_OR_DEPARTMENT_OTHER): Payer: Medicare Other

## 2015-03-29 ENCOUNTER — Other Ambulatory Visit (HOSPITAL_BASED_OUTPATIENT_CLINIC_OR_DEPARTMENT_OTHER): Payer: Medicare Other

## 2015-03-29 ENCOUNTER — Other Ambulatory Visit: Payer: Medicare Other

## 2015-03-29 VITALS — BP 126/75 | HR 72 | Temp 97.6°F | Resp 18 | Ht 62.0 in | Wt 145.1 lb

## 2015-03-29 DIAGNOSIS — Z5111 Encounter for antineoplastic chemotherapy: Secondary | ICD-10-CM

## 2015-03-29 DIAGNOSIS — C3492 Malignant neoplasm of unspecified part of left bronchus or lung: Principal | ICD-10-CM

## 2015-03-29 DIAGNOSIS — D709 Neutropenia, unspecified: Secondary | ICD-10-CM

## 2015-03-29 DIAGNOSIS — C3412 Malignant neoplasm of upper lobe, left bronchus or lung: Secondary | ICD-10-CM

## 2015-03-29 DIAGNOSIS — C3431 Malignant neoplasm of lower lobe, right bronchus or lung: Secondary | ICD-10-CM

## 2015-03-29 DIAGNOSIS — C3490 Malignant neoplasm of unspecified part of unspecified bronchus or lung: Secondary | ICD-10-CM

## 2015-03-29 DIAGNOSIS — C3491 Malignant neoplasm of unspecified part of right bronchus or lung: Secondary | ICD-10-CM

## 2015-03-29 LAB — CBC WITH DIFFERENTIAL/PLATELET
BASO%: 0.7 % (ref 0.0–2.0)
BASOS ABS: 0 10*3/uL (ref 0.0–0.1)
EOS ABS: 0 10*3/uL (ref 0.0–0.5)
EOS%: 1.4 % (ref 0.0–7.0)
HCT: 38.5 % (ref 34.8–46.6)
HGB: 12.7 g/dL (ref 11.6–15.9)
LYMPH%: 47.5 % (ref 14.0–49.7)
MCH: 33.4 pg (ref 25.1–34.0)
MCHC: 32.9 g/dL (ref 31.5–36.0)
MCV: 101.6 fL — AB (ref 79.5–101.0)
MONO#: 0.3 10*3/uL (ref 0.1–0.9)
MONO%: 11.6 % (ref 0.0–14.0)
NEUT#: 1.2 10*3/uL — ABNORMAL LOW (ref 1.5–6.5)
NEUT%: 38.8 % (ref 38.4–76.8)
PLATELETS: 260 10*3/uL (ref 145–400)
RBC: 3.79 10*6/uL (ref 3.70–5.45)
RDW: 15.2 % — ABNORMAL HIGH (ref 11.2–14.5)
WBC: 3 10*3/uL — ABNORMAL LOW (ref 3.9–10.3)
lymph#: 1.4 10*3/uL (ref 0.9–3.3)

## 2015-03-29 LAB — COMPREHENSIVE METABOLIC PANEL (CC13)
ALK PHOS: 85 U/L (ref 40–150)
ALT: 11 U/L (ref 0–55)
ANION GAP: 6 meq/L (ref 3–11)
AST: 23 U/L (ref 5–34)
Albumin: 3.5 g/dL (ref 3.5–5.0)
BUN: 13.7 mg/dL (ref 7.0–26.0)
CO2: 24 meq/L (ref 22–29)
Calcium: 9.6 mg/dL (ref 8.4–10.4)
Chloride: 112 mEq/L — ABNORMAL HIGH (ref 98–109)
Creatinine: 0.8 mg/dL (ref 0.6–1.1)
EGFR: 68 mL/min/{1.73_m2} — AB (ref >90)
GLUCOSE: 82 mg/dL (ref 70–140)
POTASSIUM: 3.9 meq/L (ref 3.5–5.1)
SODIUM: 143 meq/L (ref 136–145)
Total Bilirubin: 0.6 mg/dL (ref 0.20–1.20)
Total Protein: 7.1 g/dL (ref 6.4–8.3)

## 2015-03-29 MED ORDER — SODIUM CHLORIDE 0.9 % IJ SOLN
10.0000 mL | INTRAMUSCULAR | Status: DC | PRN
  Administered 2015-03-29: 10 mL
  Filled 2015-03-29: qty 10

## 2015-03-29 MED ORDER — HEPARIN SOD (PORK) LOCK FLUSH 100 UNIT/ML IV SOLN
500.0000 [IU] | Freq: Once | INTRAVENOUS | Status: DC | PRN
  Filled 2015-03-29: qty 5

## 2015-03-29 MED ORDER — SODIUM CHLORIDE 0.9 % IV SOLN
Freq: Once | INTRAVENOUS | Status: AC
  Administered 2015-03-29: 11:00:00 via INTRAVENOUS

## 2015-03-29 MED ORDER — SODIUM CHLORIDE 0.9 % IV SOLN
500.0000 mg/m2 | Freq: Once | INTRAVENOUS | Status: AC
  Administered 2015-03-29: 850 mg via INTRAVENOUS
  Filled 2015-03-29: qty 30

## 2015-03-29 MED ORDER — SODIUM CHLORIDE 0.9 % IV SOLN
Freq: Once | INTRAVENOUS | Status: AC
  Administered 2015-03-29: 12:00:00 via INTRAVENOUS
  Filled 2015-03-29: qty 8

## 2015-03-29 NOTE — Patient Instructions (Signed)
[facility] Discharge Instructions for Patients Receiving Chemotherapy  Today you received the following chemotherapy agents: Alimta.  To help prevent nausea and vomiting after your treatment, we encourage you to take your nausea medication: Compazine. Take one every 6 hours as needed. If you develop nausea and vomiting that is not controlled by your nausea medication, call the clinic.   BELOW ARE SYMPTOMS THAT SHOULD BE REPORTED IMMEDIATELY:  *FEVER GREATER THAN 100.5 F  *CHILLS WITH OR WITHOUT FEVER  NAUSEA AND VOMITING THAT IS NOT CONTROLLED WITH YOUR NAUSEA MEDICATION  *UNUSUAL SHORTNESS OF BREATH  *UNUSUAL BRUISING OR BLEEDING  TENDERNESS IN MOUTH AND THROAT WITH OR WITHOUT PRESENCE OF ULCERS  *URINARY PROBLEMS  *BOWEL PROBLEMS  UNUSUAL RASH Items with * indicate a potential emergency and should be followed up as soon as possible.  Feel free to call the clinic should you have any questions or concerns. The clinic phone number is [phone].  Please show the CHEMO ALERT CARD at check-in to the Emergency Department and triage nurse.   

## 2015-03-29 NOTE — Progress Notes (Signed)
[facility] OFFICE PROGRESS NOTE   Principle Diagnosis and stage: Recurrent non-small cell lung cancer, initially diagnosed a stage IB (T2, N0, M0) adenocarcinoma in June 2008 with a tumor size of 8 cm.   Prior Therapy:  #1 status post left upper lobectomy with lymph node dissection under the care of Dr. Arlyce Dice on 06/29/2006.  #2 status post 4 cycles of adjuvant chemotherapy with cisplatin and Taxotere last dose given 08/01/2006.  #3 status post 6 cycles of systemic chemotherapy with carboplatin and Alimta for disease recurrence last dose given 12/12/2009.   Current therapy: Maintenance chemotherapy with Alimta at 500 mg per meter squared given every 3 weeks status post 74 cycles.  CHEMOTHERAPY INTENT: Palliative/maintenance.  CURRENT # OF CHEMOTHERAPY CYCLES: 74    INTERVAL HISTORY:   Ms. Grime returns as scheduled. She completed cycle 74 maintenance Alimta 03/08/2015. She denies nausea/vomiting. No mouth sores. No diarrhea. Stable mild dyspnea on exertion. Cough is unchanged. No fever. She has a good appetite. She denies pain.  Objective:  Vital signs in last 24 hours:  Blood pressure 126/75, pulse 72, temperature 97.6 F (36.4 C), temperature source Oral, resp. rate 18, height '5\' 2"'$  (1.575 m), weight 145 lb 1.6 oz (65.817 kg), SpO2 100 %.    HEENT: No thrush or ulcers. Resp: Lungs clear bilaterally. Cardio: Regular rate and rhythm. GI: Abdomen soft and nontender. No hepatomegaly. Vascular: No leg edema. Calves soft and nontender. Neuro: Alert and oriented. Gait normal.  Skin: No rash. Port-A-Cath without erythema.    Lab Results: CBC Latest Ref Rng 03/29/2015 03/08/2015 02/15/2015  WBC 3.9 - 10.3 10e3/uL 3.0(L) 3.0(L) 3.1(L)  Hemoglobin 11.6 - 15.9 g/dL 12.7 12.5 12.4  Hematocrit 34.8 - 46.6 % 38.5 38.0 37.2  Platelets 145 - 400 10e3/uL 260 243 271     CMP Latest Ref Rng 03/29/2015 03/08/2015 02/15/2015  Glucose 70 - 140 mg/dl 82 91 75  BUN 7.0 -  26.0 mg/dL 13.7 12.8 9.2  Creatinine 0.6 - 1.1 mg/dL 0.8 0.8 0.8  Sodium 136 - 145 mEq/L 143 141 140  Potassium 3.5 - 5.1 mEq/L 3.9 4.6 3.9  Chloride 98 - 107 mEq/L - - -  CO2 22 - 29 mEq/L '24 26 25  '$ Calcium 8.4 - 10.4 mg/dL 9.6 9.2 8.9  Total Protein 6.4 - 8.3 g/dL 7.1 7.0 6.8  Total Bilirubin 0.20 - 1.20 mg/dL 0.60 0.73 0.60  Alkaline Phos 40 - 150 U/L 85 79 83  AST 5 - 34 U/L '23 22 21  '$ ALT 0 - 55 U/L '11 11 12    '$ Imaging:  No results found.  Medications: I have reviewed the patient's current medications.  Assessment/Plan: 1. Recurrent non-small cell lung cancer, adenocarcinoma, on maintenance chemotherapy with single agent Alimta. Most recent restaging CT evaluation 02/14/2015 showed no evidence for disease progression. 2. Mild neutropenia. Stable.   Disposition: Ms. Babino appears stable. She has completed 74 cycles of maintenance Alimta. Plan to proceed with cycle 75 today as scheduled. She will return for a follow-up visit in 3 weeks. She will contact the office in the interim with any problems. Plan reviewed with Dr. Julien Nordmann.  [facility] ANP/GNP-BC   03/29/2015  9:12 AM  ADDENDUM: Hematology/Oncology Attending: I had a face to face encounter with the patient. I recommended her care plan. This is a very pleasant 81 years old white female with recurrent non-small cell lung cancer, adenocarcinoma who is currently undergoing maintenance systemic chemotherapy with single agent Alimta status post 52  cycles and has been tolerating her treatment well with no significant adverse effects. The patient denied having any significant fever or chills. She has no nausea or vomiting. She continues to have mild neutropenia. I recommended for her to proceed with cycle #75 today as a scheduled. She will come back for follow-up visit in 3 weeks for reevaluation before the next cycle of her treatment. The patient was advised to call immediately if she has any concerning symptoms in the  interval.  Disclaimer: This note was dictated with voice recognition software. Similar sounding words can inadvertently be transcribed and may be missed upon review. [name]., MD 03/29/2015

## 2015-03-29 NOTE — Telephone Encounter (Signed)
Gave and printed appt sched and avs for pt for NOV and DEC

## 2015-03-29 NOTE — Progress Notes (Signed)
Ok to treat with ANC 1.2 per Daisey Must

## 2015-04-19 ENCOUNTER — Ambulatory Visit (HOSPITAL_BASED_OUTPATIENT_CLINIC_OR_DEPARTMENT_OTHER): Payer: Medicare Other

## 2015-04-19 ENCOUNTER — Ambulatory Visit (HOSPITAL_BASED_OUTPATIENT_CLINIC_OR_DEPARTMENT_OTHER): Payer: Medicare Other | Admitting: Internal Medicine

## 2015-04-19 ENCOUNTER — Encounter: Payer: Self-pay | Admitting: Internal Medicine

## 2015-04-19 ENCOUNTER — Other Ambulatory Visit (HOSPITAL_BASED_OUTPATIENT_CLINIC_OR_DEPARTMENT_OTHER): Payer: Medicare Other

## 2015-04-19 VITALS — BP 121/74 | HR 67 | Temp 97.9°F | Resp 18 | Ht 62.0 in | Wt 144.5 lb

## 2015-04-19 DIAGNOSIS — C3412 Malignant neoplasm of upper lobe, left bronchus or lung: Secondary | ICD-10-CM

## 2015-04-19 DIAGNOSIS — C3491 Malignant neoplasm of unspecified part of right bronchus or lung: Secondary | ICD-10-CM

## 2015-04-19 DIAGNOSIS — C3492 Malignant neoplasm of unspecified part of left bronchus or lung: Principal | ICD-10-CM

## 2015-04-19 DIAGNOSIS — Z5111 Encounter for antineoplastic chemotherapy: Secondary | ICD-10-CM | POA: Diagnosis not present

## 2015-04-19 DIAGNOSIS — C3490 Malignant neoplasm of unspecified part of unspecified bronchus or lung: Secondary | ICD-10-CM

## 2015-04-19 LAB — COMPREHENSIVE METABOLIC PANEL (CC13)
ALBUMIN: 3.4 g/dL — AB (ref 3.5–5.0)
ALK PHOS: 86 U/L (ref 40–150)
ALT: 13 U/L (ref 0–55)
ANION GAP: 9 meq/L (ref 3–11)
AST: 26 U/L (ref 5–34)
BILIRUBIN TOTAL: 0.56 mg/dL (ref 0.20–1.20)
BUN: 7.5 mg/dL (ref 7.0–26.0)
CO2: 22 mEq/L (ref 22–29)
CREATININE: 0.8 mg/dL (ref 0.6–1.1)
Calcium: 9.2 mg/dL (ref 8.4–10.4)
Chloride: 110 mEq/L — ABNORMAL HIGH (ref 98–109)
EGFR: 68 mL/min/{1.73_m2} — AB (ref >90)
GLUCOSE: 68 mg/dL — AB (ref 70–140)
Potassium: 4.5 mEq/L (ref 3.5–5.1)
SODIUM: 141 meq/L (ref 136–145)
TOTAL PROTEIN: 6.9 g/dL (ref 6.4–8.3)

## 2015-04-19 LAB — CBC WITH DIFFERENTIAL/PLATELET
BASO%: 0.3 % (ref 0.0–2.0)
Basophils Absolute: 0 10*3/uL (ref 0.0–0.1)
EOS%: 1 % (ref 0.0–7.0)
Eosinophils Absolute: 0 10*3/uL (ref 0.0–0.5)
HCT: 39 % (ref 34.8–46.6)
HGB: 12.7 g/dL (ref 11.6–15.9)
LYMPH%: 49.1 % (ref 14.0–49.7)
MCH: 33.4 pg (ref 25.1–34.0)
MCHC: 32.6 g/dL (ref 31.5–36.0)
MCV: 102.6 fL — ABNORMAL HIGH (ref 79.5–101.0)
MONO#: 0.4 10*3/uL (ref 0.1–0.9)
MONO%: 13.7 % (ref 0.0–14.0)
NEUT#: 1.1 10*3/uL — ABNORMAL LOW (ref 1.5–6.5)
NEUT%: 35.9 % — ABNORMAL LOW (ref 38.4–76.8)
Platelets: 241 10*3/uL (ref 145–400)
RBC: 3.8 10*6/uL (ref 3.70–5.45)
RDW: 14.5 % (ref 11.2–14.5)
WBC: 2.9 10*3/uL — ABNORMAL LOW (ref 3.9–10.3)
lymph#: 1.4 10*3/uL (ref 0.9–3.3)

## 2015-04-19 MED ORDER — CYANOCOBALAMIN 1000 MCG/ML IJ SOLN
1000.0000 ug | Freq: Once | INTRAMUSCULAR | Status: AC
  Administered 2015-04-19: 1000 ug via INTRAMUSCULAR

## 2015-04-19 MED ORDER — CYANOCOBALAMIN 1000 MCG/ML IJ SOLN
INTRAMUSCULAR | Status: AC
  Filled 2015-04-19: qty 1

## 2015-04-19 MED ORDER — SODIUM CHLORIDE 0.9 % IJ SOLN
10.0000 mL | INTRAMUSCULAR | Status: DC | PRN
  Administered 2015-04-19: 10 mL
  Filled 2015-04-19: qty 10

## 2015-04-19 MED ORDER — SODIUM CHLORIDE 0.9 % IV SOLN
500.0000 mg/m2 | Freq: Once | INTRAVENOUS | Status: AC
  Administered 2015-04-19: 850 mg via INTRAVENOUS
  Filled 2015-04-19: qty 34

## 2015-04-19 MED ORDER — PEGFILGRASTIM 6 MG/0.6ML ~~LOC~~ PSKT
6.0000 mg | PREFILLED_SYRINGE | Freq: Once | SUBCUTANEOUS | Status: DC
  Filled 2015-04-19: qty 0.6

## 2015-04-19 MED ORDER — SODIUM CHLORIDE 0.9 % IV SOLN
Freq: Once | INTRAVENOUS | Status: AC
  Administered 2015-04-19: 10:00:00 via INTRAVENOUS

## 2015-04-19 MED ORDER — HEPARIN SOD (PORK) LOCK FLUSH 100 UNIT/ML IV SOLN
500.0000 [IU] | Freq: Once | INTRAVENOUS | Status: AC | PRN
  Administered 2015-04-19: 500 [IU]
  Filled 2015-04-19: qty 5

## 2015-04-19 MED ORDER — SODIUM CHLORIDE 0.9 % IV SOLN
Freq: Once | INTRAVENOUS | Status: AC
  Administered 2015-04-19: 10:00:00 via INTRAVENOUS
  Filled 2015-04-19: qty 8

## 2015-04-19 NOTE — Progress Notes (Signed)
Ok to treat pt with anc count being low. Dr. Julien Nordmann discussed with pt and written ok to proceed on note from today's appt.

## 2015-04-19 NOTE — Progress Notes (Signed)
[facility] [phone]   Fax:(336) [place], MD [place] [address]  Principle Diagnosis and stage: Recurrent non-small cell lung cancer, initially diagnosed a stage IB (T2, N0, M0) adenocarcinoma in June 2008 with a tumor size of 8 cm.   Prior Therapy:  #1 status post left upper lobectomy with lymph node dissection under the care of Dr. Arlyce Dice on 06/29/2006.  #2 status post 4 cycles of adjuvant chemotherapy with cisplatin and Taxotere last dose given 08/01/2006.  #3 status post 6 cycles of systemic chemotherapy with carboplatin and Alimta for disease recurrence last dose given 12/12/2009.   Current therapy: Maintenance chemotherapy with Alimta at 500 mg per meter squared given every 3 weeks status post 74 cycles.  CHEMOTHERAPY INTENT: Palliative/maintenance.  CURRENT # OF CHEMOTHERAPY CYCLES: 75  CURRENT ANTIEMETICS: Zofran, dexamethasone and Compazine  CURRENT SMOKING STATUS: Never Smoker.   ORAL CHEMOTHERAPY AND CONSENT: None   CURRENT BISPHOSPHONATES USE: None  PAIN MANAGEMENT: None  NARCOTICS INDUCED CONSTIPATION: None  LIVING WILL AND CODE STATUS: has living will for no CODE BLUE.  INTERVAL HISTORY: [name] 81 y.o. female returns to the clinic today for follow up visit. The patient has no complaints today. She is tolerating her maintenance chemotherapy with single agent Alimta fairly well. The patient denied having any significant nausea or vomiting. She has no fever or chills. The patient denied having any significant chest pain, shortness of breath, or hemoptysis. She has no weight loss or night sweats.  She is here today to start cycle #75.   MEDICAL HISTORY: Past Medical History  Diagnosis Date  . History of migraine headaches   . Hypercholesterolemia   . lung ca dx'd 06/2006    rt and lt lung  . Lung cancer ([place]) 3/14/1    right- Adenocarcinoma  w/bronchioalveolar features  . FHx: chemotherapy 2008&2011    4 cycles cisplatin,taxotere,2008& carboplatin and Alimta 2011    ALLERGIES:  is allergic to simvastatin and iodinated diagnostic agents.  MEDICATIONS:  Current Outpatient Prescriptions  Medication Sig Dispense Refill  . Calcium Carbonate-Vit D-Min (CALTRATE PLUS PO) Take 500 mg by mouth 2 (two) times daily.      . folic acid (FOLVITE) 1 MG tablet Take 1 mg by mouth daily.      . hydrocortisone 2.5 % cream     . Multiple Vitamins-Minerals (CENTRUM SILVER PO) Take 1 tablet by mouth daily.     . potassium gluconate 595 MG TABS Take 595 mg by mouth daily.    . prochlorperazine (COMPAZINE) 10 MG tablet Take 1 tablet (10 mg total) by mouth every 6 (six) hours as needed. 15 tablet 1  . vitamin B-12 (CYANOCOBALAMIN) 500 MCG tablet Take 1,500 mcg by mouth daily.      No current facility-administered medications for this visit.    REVIEW OF SYSTEMS:  Constitutional: negative Eyes: negative Ears, nose, mouth, throat, and face: negative Respiratory: negative Cardiovascular: negative Gastrointestinal: negative Genitourinary:negative Integument/breast: negative Hematologic/lymphatic: negative Musculoskeletal:negative Neurological: negative Behavioral/Psych: negative Endocrine: negative Allergic/Immunologic: negative   PHYSICAL EXAMINATION: General appearance: alert, cooperative and no distress Head: Normocephalic, without obvious abnormality, atraumatic Neck: no adenopathy Lymph nodes: Cervical, supraclavicular, and axillary nodes normal. Resp: clear to auscultation bilaterally Back: symmetric, no curvature. ROM normal. No CVA tenderness. Cardio: regular rate and rhythm, S1, S2 normal, no murmur, click, rub or gallop GI: soft, non-tender; bowel sounds normal; no masses,  no  organomegaly Extremities: extremities normal, atraumatic, no cyanosis or edema Neurologic: Alert and oriented X 3, normal strength and tone. Normal  symmetric reflexes. Normal coordination and gait  ECOG PERFORMANCE STATUS: 0 - Asymptomatic  Blood pressure 121/74, pulse 67, temperature 97.9 F (36.6 C), temperature source Oral, resp. rate 18, height '5\' 2"'$  (1.575 m), weight 144 lb 8 oz (65.545 kg), SpO2 100 %.  LABORATORY DATA: Lab Results  Component Value Date   WBC 2.9* 04/19/2015   HGB 12.7 04/19/2015   HCT 39.0 04/19/2015   MCV 102.6* 04/19/2015   PLT 241 04/19/2015      Chemistry      Component Value Date/Time   NA 143 03/29/2015 0855   NA 139 01/24/2012 1008   NA 140 08/21/2011 1306   K 3.9 03/29/2015 0855   K 4.2 01/24/2012 1008   K 4.1 08/21/2011 1306   CL 108* 11/04/2012 0947   CL 103 01/24/2012 1008   CL 107 08/21/2011 1306   CO2 24 03/29/2015 0855   CO2 28 01/24/2012 1008   CO2 22 08/21/2011 1306   BUN 13.7 03/29/2015 0855   BUN 13 01/24/2012 1008   BUN 11 08/21/2011 1306   CREATININE 0.8 03/29/2015 0855   CREATININE 0.8 01/24/2012 1008   CREATININE 0.81 08/21/2011 1306      Component Value Date/Time   CALCIUM 9.6 03/29/2015 0855   CALCIUM 8.6 01/24/2012 1008   CALCIUM 8.8 08/21/2011 1306   ALKPHOS 85 03/29/2015 0855   ALKPHOS 79 01/24/2012 1008   ALKPHOS 87 08/21/2011 1306   AST 23 03/29/2015 0855   AST 24 01/24/2012 1008   AST 17 08/21/2011 1306   ALT 11 03/29/2015 0855   ALT 21 01/24/2012 1008   ALT 8 08/21/2011 1306   BILITOT 0.60 03/29/2015 0855   BILITOT 0.90 01/24/2012 1008   BILITOT 0.6 08/21/2011 1306       RADIOGRAPHIC STUDIES: No results found. ASSESSMENT AND PLAN: This is a very pleasant 81 years old white female with recurrent non-small cell lung cancer, adenocarcinoma. The patient is currently undergoing maintenance chemotherapy with single agent Alimta status post 74 cycles and she is tolerating it fairly well except for mild fatigue.  Her absolute neutrophil count is low today. I recommended for the patient to proceed with her treatment with Alimta as a scheduled today but I  will start the patient on Onpro today for neutropenic prophylaxis. She will come back for follow-up visit in 3 weeks with the next cycle of her treatment.  She was advised to call immediately if she has any concerning symptoms in the interval.  Disclaimer: This note was dictated with voice recognition software. Similar sounding words can inadvertently be transcribed and may not be corrected upon review.  [name]., MD 04/19/2015

## 2015-04-19 NOTE — Progress Notes (Signed)
Pt will come back for neulasta injection tomorrow at 315pm. Told pt that she can take Claritin today, tomorrow, and the day after neulasta shot to prevent aches and pain. May also take tylenol for comfort

## 2015-04-19 NOTE — Patient Instructions (Signed)
[facility] Discharge Instructions for Patients Receiving Chemotherapy  Today you received the following chemotherapy agents: Alimta.  To help prevent nausea and vomiting after your treatment, we encourage you to take your nausea medication: Compazine. Take one every 6 hours as needed. If you develop nausea and vomiting that is not controlled by your nausea medication, call the clinic.   BELOW ARE SYMPTOMS THAT SHOULD BE REPORTED IMMEDIATELY:  *FEVER GREATER THAN 100.5 F  *CHILLS WITH OR WITHOUT FEVER  NAUSEA AND VOMITING THAT IS NOT CONTROLLED WITH YOUR NAUSEA MEDICATION  *UNUSUAL SHORTNESS OF BREATH  *UNUSUAL BRUISING OR BLEEDING  TENDERNESS IN MOUTH AND THROAT WITH OR WITHOUT PRESENCE OF ULCERS  *URINARY PROBLEMS  *BOWEL PROBLEMS  UNUSUAL RASH Items with * indicate a potential emergency and should be followed up as soon as possible.  Feel free to call the clinic should you have any questions or concerns. The clinic phone number is [phone].  Please show the CHEMO ALERT CARD at check-in to the Emergency Department and triage nurse.   

## 2015-04-20 ENCOUNTER — Ambulatory Visit (HOSPITAL_BASED_OUTPATIENT_CLINIC_OR_DEPARTMENT_OTHER): Payer: Medicare Other

## 2015-04-20 VITALS — BP 148/74 | HR 65 | Temp 97.8°F

## 2015-04-20 DIAGNOSIS — Z5189 Encounter for other specified aftercare: Secondary | ICD-10-CM | POA: Diagnosis not present

## 2015-04-20 DIAGNOSIS — C3491 Malignant neoplasm of unspecified part of right bronchus or lung: Secondary | ICD-10-CM

## 2015-04-20 DIAGNOSIS — C3492 Malignant neoplasm of unspecified part of left bronchus or lung: Secondary | ICD-10-CM | POA: Diagnosis not present

## 2015-04-20 MED ORDER — PEGFILGRASTIM INJECTION 6 MG/0.6ML ~~LOC~~
6.0000 mg | PREFILLED_SYRINGE | Freq: Once | SUBCUTANEOUS | Status: AC
Start: 2015-04-20 — End: 2015-04-20
  Administered 2015-04-20: 6 mg via SUBCUTANEOUS
  Filled 2015-04-20: qty 0.6

## 2015-04-20 NOTE — Patient Instructions (Signed)
Pegfilgrastim injection What is this medicine? PEGFILGRASTIM (PEG fil gra stim) is a long-acting granulocyte colony-stimulating factor that stimulates the growth of neutrophils, a type of white blood cell important in the body's fight against infection. It is used to reduce the incidence of fever and infection in patients with certain types of cancer who are receiving chemotherapy that affects the bone marrow, and to increase survival after being exposed to high doses of radiation. This medicine may be used for other purposes; ask your health care provider or pharmacist if you have questions. What should I tell my health care provider before I take this medicine? They need to know if you have any of these conditions: -kidney disease -latex allergy -ongoing radiation therapy -sickle cell disease -skin reactions to acrylic adhesives (On-Body Injector only) -an unusual or allergic reaction to pegfilgrastim, filgrastim, other medicines, foods, dyes, or preservatives -pregnant or trying to get pregnant -breast-feeding How should I use this medicine? This medicine is for injection under the skin. If you get this medicine at home, you will be taught how to prepare and give the pre-filled syringe or how to use the On-body Injector. Refer to the patient Instructions for Use for detailed instructions. Use exactly as directed. Take your medicine at regular intervals. Do not take your medicine more often than directed. It is important that you put your used needles and syringes in a special sharps container. Do not put them in a trash can. If you do not have a sharps container, call your pharmacist or healthcare provider to get one. Talk to your pediatrician regarding the use of this medicine in children. While this drug may be prescribed for selected conditions, precautions do apply. Overdosage: If you think you have taken too much of this medicine contact a poison control center or emergency room at  once. NOTE: This medicine is only for you. Do not share this medicine with others. What if I miss a dose? It is important not to miss your dose. Call your doctor or health care professional if you miss your dose. If you miss a dose due to an On-body Injector failure or leakage, a new dose should be administered as soon as possible using a single prefilled syringe for manual use. What may interact with this medicine? Interactions have not been studied. Give your health care provider a list of all the medicines, herbs, non-prescription drugs, or dietary supplements you use. Also tell them if you smoke, drink alcohol, or use illegal drugs. Some items may interact with your medicine. This list may not describe all possible interactions. Give your health care provider a list of all the medicines, herbs, non-prescription drugs, or dietary supplements you use. Also tell them if you smoke, drink alcohol, or use illegal drugs. Some items may interact with your medicine. What should I watch for while using this medicine? You may need blood work done while you are taking this medicine. If you are going to need a MRI, CT scan, or other procedure, tell your doctor that you are using this medicine (On-Body Injector only). What side effects may I notice from receiving this medicine? Side effects that you should report to your doctor or health care professional as soon as possible: -allergic reactions like skin rash, itching or hives, swelling of the face, lips, or tongue -dizziness -fever -pain, redness, or irritation at site where injected -pinpoint red spots on the skin -red or dark-brown urine -shortness of breath or breathing problems -stomach or side pain, or pain   at the shoulder -swelling -tiredness -trouble passing urine or change in the amount of urine Side effects that usually do not require medical attention (report to your doctor or health care professional if they continue or are  bothersome): -bone pain -muscle pain This list may not describe all possible side effects. Call your doctor for medical advice about side effects. You may report side effects to FDA at 1-800-FDA-1088. Where should I keep my medicine? Keep out of the reach of children. Store pre-filled syringes in a refrigerator between 2 and 8 degrees C (36 and 46 degrees F). Do not freeze. Keep in carton to protect from light. Throw away this medicine if it is left out of the refrigerator for more than 48 hours. Throw away any unused medicine after the expiration date. NOTE: This sheet is a summary. It may not cover all possible information. If you have questions about this medicine, talk to your doctor, pharmacist, or health care provider.    2016, [employer]vier/Gold Standard. (2014-06-09 14:30:14)  

## 2015-04-28 ENCOUNTER — Other Ambulatory Visit: Payer: Self-pay | Admitting: Nurse Practitioner

## 2015-04-29 ENCOUNTER — Other Ambulatory Visit: Payer: Self-pay | Admitting: Nurse Practitioner

## 2015-05-09 ENCOUNTER — Other Ambulatory Visit: Payer: Self-pay | Admitting: Medical Oncology

## 2015-05-09 DIAGNOSIS — C3492 Malignant neoplasm of unspecified part of left bronchus or lung: Principal | ICD-10-CM

## 2015-05-09 DIAGNOSIS — C3491 Malignant neoplasm of unspecified part of right bronchus or lung: Secondary | ICD-10-CM

## 2015-05-10 ENCOUNTER — Telehealth: Payer: Self-pay | Admitting: *Deleted

## 2015-05-10 ENCOUNTER — Other Ambulatory Visit (HOSPITAL_BASED_OUTPATIENT_CLINIC_OR_DEPARTMENT_OTHER): Payer: Medicare Other

## 2015-05-10 ENCOUNTER — Ambulatory Visit (HOSPITAL_BASED_OUTPATIENT_CLINIC_OR_DEPARTMENT_OTHER): Payer: Medicare Other

## 2015-05-10 ENCOUNTER — Ambulatory Visit (HOSPITAL_BASED_OUTPATIENT_CLINIC_OR_DEPARTMENT_OTHER): Payer: Medicare Other | Admitting: Internal Medicine

## 2015-05-10 ENCOUNTER — Encounter: Payer: Self-pay | Admitting: Internal Medicine

## 2015-05-10 ENCOUNTER — Telehealth: Payer: Self-pay | Admitting: Internal Medicine

## 2015-05-10 VITALS — BP 124/70 | HR 72 | Temp 97.8°F | Resp 18 | Ht 62.0 in | Wt 145.1 lb

## 2015-05-10 DIAGNOSIS — Z5111 Encounter for antineoplastic chemotherapy: Secondary | ICD-10-CM | POA: Diagnosis not present

## 2015-05-10 DIAGNOSIS — C3412 Malignant neoplasm of upper lobe, left bronchus or lung: Secondary | ICD-10-CM

## 2015-05-10 DIAGNOSIS — C3492 Malignant neoplasm of unspecified part of left bronchus or lung: Principal | ICD-10-CM

## 2015-05-10 DIAGNOSIS — C3491 Malignant neoplasm of unspecified part of right bronchus or lung: Secondary | ICD-10-CM

## 2015-05-10 LAB — COMPREHENSIVE METABOLIC PANEL
ALBUMIN: 3.4 g/dL — AB (ref 3.5–5.0)
ALK PHOS: 96 U/L (ref 40–150)
ALT: <9 U/L (ref 0–55)
AST: 20 U/L (ref 5–34)
Anion Gap: 10 mEq/L (ref 3–11)
BILIRUBIN TOTAL: 0.68 mg/dL (ref 0.20–1.20)
BUN: 12 mg/dL (ref 7.0–26.0)
CALCIUM: 9 mg/dL (ref 8.4–10.4)
CO2: 21 mEq/L — ABNORMAL LOW (ref 22–29)
Chloride: 109 mEq/L (ref 98–109)
Creatinine: 0.8 mg/dL (ref 0.6–1.1)
EGFR: 66 mL/min/{1.73_m2} — AB (ref >90)
GLUCOSE: 77 mg/dL (ref 70–140)
POTASSIUM: 4.2 meq/L (ref 3.5–5.1)
Sodium: 140 mEq/L (ref 136–145)
TOTAL PROTEIN: 7.1 g/dL (ref 6.4–8.3)

## 2015-05-10 LAB — CBC WITH DIFFERENTIAL/PLATELET
BASO%: 0.4 % (ref 0.0–2.0)
BASOS ABS: 0 10*3/uL (ref 0.0–0.1)
EOS ABS: 0.1 10*3/uL (ref 0.0–0.5)
EOS%: 1 % (ref 0.0–7.0)
HEMATOCRIT: 39.4 % (ref 34.8–46.6)
HEMOGLOBIN: 12.9 g/dL (ref 11.6–15.9)
LYMPH#: 1.9 10*3/uL (ref 0.9–3.3)
LYMPH%: 37 % (ref 14.0–49.7)
MCH: 33.7 pg (ref 25.1–34.0)
MCHC: 32.7 g/dL (ref 31.5–36.0)
MCV: 102.9 fL — AB (ref 79.5–101.0)
MONO#: 0.7 10*3/uL (ref 0.1–0.9)
MONO%: 13.4 % (ref 0.0–14.0)
NEUT%: 48.2 % (ref 38.4–76.8)
NEUTROS ABS: 2.5 10*3/uL (ref 1.5–6.5)
PLATELETS: 321 10*3/uL (ref 145–400)
RBC: 3.83 10*6/uL (ref 3.70–5.45)
RDW: 15.3 % — AB (ref 11.2–14.5)
WBC: 5.2 10*3/uL (ref 3.9–10.3)

## 2015-05-10 MED ORDER — SODIUM CHLORIDE 0.9 % IJ SOLN
10.0000 mL | INTRAMUSCULAR | Status: DC | PRN
  Administered 2015-05-10: 10 mL
  Filled 2015-05-10: qty 10

## 2015-05-10 MED ORDER — SODIUM CHLORIDE 0.9 % IV SOLN
500.0000 mg/m2 | Freq: Once | INTRAVENOUS | Status: AC
  Administered 2015-05-10: 850 mg via INTRAVENOUS
  Filled 2015-05-10: qty 34

## 2015-05-10 MED ORDER — SODIUM CHLORIDE 0.9 % IV SOLN
Freq: Once | INTRAVENOUS | Status: AC
  Administered 2015-05-10: 10:00:00 via INTRAVENOUS

## 2015-05-10 MED ORDER — SODIUM CHLORIDE 0.9 % IV SOLN
Freq: Once | INTRAVENOUS | Status: AC
  Administered 2015-05-10: 10:00:00 via INTRAVENOUS
  Filled 2015-05-10: qty 8

## 2015-05-10 MED ORDER — HEPARIN SOD (PORK) LOCK FLUSH 100 UNIT/ML IV SOLN
500.0000 [IU] | Freq: Once | INTRAVENOUS | Status: AC | PRN
  Administered 2015-05-10: 500 [IU]
  Filled 2015-05-10: qty 5

## 2015-05-10 NOTE — Telephone Encounter (Signed)
Per staff message and POF I have scheduled appts. Advised scheduler of appts. JMW  

## 2015-05-10 NOTE — Progress Notes (Signed)
[facility] [phone]   Fax:(336) [place], [place] [address]  Principle Diagnosis and stage: Recurrent non-small cell lung cancer, initially diagnosed a stage IB (T2, N0, M0) adenocarcinoma in June 2008 with a tumor size of 8 cm.   Prior Therapy:  #1 status post left upper lobectomy with lymph node dissection under the care of Dr. Arlyce Dice on 06/29/2006.  #2 status post 4 cycles of adjuvant chemotherapy with cisplatin and Taxotere last dose given 08/01/2006.  #3 status post 6 cycles of systemic chemotherapy with carboplatin and Alimta for disease recurrence last dose given 12/12/2009.   Current therapy: Maintenance chemotherapy with Alimta at 500 mg per meter squared given every 3 weeks status post 75 cycles.  CHEMOTHERAPY INTENT: Palliative/maintenance.  CURRENT # OF CHEMOTHERAPY CYCLES: 76  CURRENT ANTIEMETICS: Zofran, dexamethasone and Compazine  CURRENT SMOKING STATUS: Never Smoker.   ORAL CHEMOTHERAPY AND CONSENT: None   CURRENT BISPHOSPHONATES USE: None  PAIN MANAGEMENT: None  NARCOTICS INDUCED CONSTIPATION: None  LIVING WILL AND CODE STATUS: has living will for no CODE BLUE.  INTERVAL HISTORY: [name] 81 y.o. female returns to the clinic today for follow up visit. The patient has no complaints today. She is tolerating her maintenance chemotherapy with single agent Alimta fairly well. The patient denied having any significant nausea or vomiting. She has no fever or chills. The patient denied having any significant chest pain, shortness of breath, or hemoptysis. She has some wheezes and snoring when she lays on the right side. She has no weight loss or night sweats.  She is here today to start cycle #76.   MEDICAL HISTORY: Past Medical History  Diagnosis Date  . History of migraine headaches   . Hypercholesterolemia   . lung ca dx'd 06/2006    rt and lt lung  .  Lung cancer ([place]) 3/14/1    right- Adenocarcinoma w/bronchioalveolar features  . FHx: chemotherapy 2008&2011    4 cycles cisplatin,taxotere,2008& carboplatin and Alimta 2011    ALLERGIES:  is allergic to simvastatin and iodinated diagnostic agents.  MEDICATIONS:  Current Outpatient Prescriptions  Medication Sig Dispense Refill  . Calcium Carbonate-Vit D-Min (CALTRATE PLUS PO) Take 500 mg by mouth 2 (two) times daily.      . folic acid (FOLVITE) 1 MG tablet Take 1 mg by mouth daily.      . hydrocortisone 2.5 % cream     . potassium gluconate 595 MG TABS Take 595 mg by mouth daily.    . vitamin B-12 (CYANOCOBALAMIN) 500 MCG tablet Take 1,500 mcg by mouth daily.     . Multiple Vitamins-Minerals (CENTRUM SILVER PO) Take 1 tablet by mouth daily.     . prochlorperazine (COMPAZINE) 10 MG tablet Take 1 tablet (10 mg total) by mouth every 6 (six) hours as needed. (Patient not taking: Reported on 05/10/2015) 15 tablet 1   No current facility-administered medications for this visit.    REVIEW OF SYSTEMS:  A comprehensive review of systems was negative.   PHYSICAL EXAMINATION: General appearance: alert, cooperative and no distress Head: Normocephalic, without obvious abnormality, atraumatic Neck: no adenopathy Lymph nodes: Cervical, supraclavicular, and axillary nodes normal. Resp: clear to auscultation bilaterally Back: symmetric, no curvature. ROM normal. No CVA tenderness. Cardio: regular rate and rhythm, S1, S2 normal, no murmur, click, rub or gallop GI: soft, non-tender; bowel sounds normal; no masses,  no organomegaly Extremities: extremities normal, atraumatic,  no cyanosis or edema Neurologic: Alert and oriented X 3, normal strength and tone. Normal symmetric reflexes. Normal coordination and gait  ECOG PERFORMANCE STATUS: 0 - Asymptomatic  Blood pressure 124/70, pulse 72, temperature 97.8 F (36.6 C), temperature source Oral, resp. rate 18, height '5\' 2"'$  (1.575 m), weight 145 lb 1.6  oz (65.817 kg), SpO2 100 %.  LABORATORY DATA: Lab Results  Component Value Date   WBC 5.2 05/10/2015   HGB 12.9 05/10/2015   HCT 39.4 05/10/2015   MCV 102.9* 05/10/2015   PLT 321 05/10/2015      Chemistry      Component Value Date/Time   NA 141 04/19/2015 0851   NA 139 01/24/2012 1008   NA 140 08/21/2011 1306   K 4.5 04/19/2015 0851   K 4.2 01/24/2012 1008   K 4.1 08/21/2011 1306   CL 108* 11/04/2012 0947   CL 103 01/24/2012 1008   CL 107 08/21/2011 1306   CO2 22 04/19/2015 0851   CO2 28 01/24/2012 1008   CO2 22 08/21/2011 1306   BUN 7.5 04/19/2015 0851   BUN 13 01/24/2012 1008   BUN 11 08/21/2011 1306   CREATININE 0.8 04/19/2015 0851   CREATININE 0.8 01/24/2012 1008   CREATININE 0.81 08/21/2011 1306      Component Value Date/Time   CALCIUM 9.2 04/19/2015 0851   CALCIUM 8.6 01/24/2012 1008   CALCIUM 8.8 08/21/2011 1306   ALKPHOS 86 04/19/2015 0851   ALKPHOS 79 01/24/2012 1008   ALKPHOS 87 08/21/2011 1306   AST 26 04/19/2015 0851   AST 24 01/24/2012 1008   AST 17 08/21/2011 1306   ALT 13 04/19/2015 0851   ALT 21 01/24/2012 1008   ALT 8 08/21/2011 1306   BILITOT 0.56 04/19/2015 0851   BILITOT 0.90 01/24/2012 1008   BILITOT 0.6 08/21/2011 1306       RADIOGRAPHIC STUDIES: No results found. ASSESSMENT AND PLAN: This is a very pleasant 81 years old white female with recurrent non-small cell lung cancer, adenocarcinoma. The patient is currently undergoing maintenance chemotherapy with single agent Alimta status post 75 cycles and she is tolerating it fairly well.  Her blood count is good today to proceed with the treatment. I recommended for the patient to proceed with her treatment with Alimta as a scheduled today.  She will come back for follow-up visit in 3 weeks with the next cycle of her treatment after repeating CT scan of the chest for restaging of her disease.   She was advised to call immediately if she has any concerning symptoms in the  interval.  Disclaimer: This note was dictated with voice recognition software. Similar sounding words can inadvertently be transcribed and may not be corrected upon review.  [name]., MD 05/10/2015

## 2015-05-10 NOTE — Patient Instructions (Signed)
[facility] Discharge Instructions for Patients Receiving Chemotherapy  Today you received the following chemotherapy agents Alimta.  To help prevent nausea and vomiting after your treatment, we encourage you to take your nausea medication as prescribed.   If you develop nausea and vomiting that is not controlled by your nausea medication, call the clinic.   BELOW ARE SYMPTOMS THAT SHOULD BE REPORTED IMMEDIATELY:  *FEVER GREATER THAN 100.5 F  *CHILLS WITH OR WITHOUT FEVER  NAUSEA AND VOMITING THAT IS NOT CONTROLLED WITH YOUR NAUSEA MEDICATION  *UNUSUAL SHORTNESS OF BREATH  *UNUSUAL BRUISING OR BLEEDING  TENDERNESS IN MOUTH AND THROAT WITH OR WITHOUT PRESENCE OF ULCERS  *URINARY PROBLEMS  *BOWEL PROBLEMS  UNUSUAL RASH Items with * indicate a potential emergency and should be followed up as soon as possible.  Feel free to call the clinic you have any questions or concerns. The clinic phone number is [phone].  Please show the CHEMO ALERT CARD at check-in to the Emergency Department and triage nurse.   

## 2015-05-10 NOTE — Telephone Encounter (Signed)
per pof to sch pt appt-gave ptr copy fo avs-sent MW emailt os ch pt trmt-pt to get updated copy of avs

## 2015-05-30 ENCOUNTER — Other Ambulatory Visit: Payer: Self-pay | Admitting: Internal Medicine

## 2015-05-30 ENCOUNTER — Ambulatory Visit (HOSPITAL_COMMUNITY)
Admission: RE | Admit: 2015-05-30 | Discharge: 2015-05-30 | Disposition: A | Discharge status: Home / Self Care | Payer: Medicare Other | Source: Ambulatory Visit | Attending: Internal Medicine | Admitting: Internal Medicine

## 2015-05-30 DIAGNOSIS — I700 Atherosclerosis of aorta: Secondary | ICD-10-CM | POA: Insufficient documentation

## 2015-05-30 DIAGNOSIS — C3492 Malignant neoplasm of unspecified part of left bronchus or lung: Principal | ICD-10-CM

## 2015-05-30 DIAGNOSIS — C3491 Malignant neoplasm of unspecified part of right bronchus or lung: Secondary | ICD-10-CM | POA: Insufficient documentation

## 2015-05-30 DIAGNOSIS — I2510 Atherosclerotic heart disease of native coronary artery without angina pectoris: Secondary | ICD-10-CM | POA: Insufficient documentation

## 2015-05-31 ENCOUNTER — Telehealth: Payer: Self-pay | Admitting: Internal Medicine

## 2015-05-31 ENCOUNTER — Encounter: Payer: Self-pay | Admitting: Internal Medicine

## 2015-05-31 ENCOUNTER — Ambulatory Visit (HOSPITAL_BASED_OUTPATIENT_CLINIC_OR_DEPARTMENT_OTHER): Payer: Medicare Other

## 2015-05-31 ENCOUNTER — Ambulatory Visit (HOSPITAL_BASED_OUTPATIENT_CLINIC_OR_DEPARTMENT_OTHER): Payer: Medicare Other | Admitting: Internal Medicine

## 2015-05-31 ENCOUNTER — Other Ambulatory Visit (HOSPITAL_BASED_OUTPATIENT_CLINIC_OR_DEPARTMENT_OTHER): Payer: Medicare Other

## 2015-05-31 VITALS — BP 137/69 | HR 59 | Temp 98.2°F | Resp 18 | Ht 62.0 in | Wt 143.5 lb

## 2015-05-31 DIAGNOSIS — C3412 Malignant neoplasm of upper lobe, left bronchus or lung: Secondary | ICD-10-CM

## 2015-05-31 DIAGNOSIS — C3491 Malignant neoplasm of unspecified part of right bronchus or lung: Secondary | ICD-10-CM

## 2015-05-31 DIAGNOSIS — Z5111 Encounter for antineoplastic chemotherapy: Secondary | ICD-10-CM

## 2015-05-31 DIAGNOSIS — C3492 Malignant neoplasm of unspecified part of left bronchus or lung: Principal | ICD-10-CM

## 2015-05-31 LAB — COMPREHENSIVE METABOLIC PANEL
ALT: 10 U/L (ref 0–55)
ANION GAP: 7 meq/L (ref 3–11)
AST: 20 U/L (ref 5–34)
Albumin: 3.2 g/dL — ABNORMAL LOW (ref 3.5–5.0)
Alkaline Phosphatase: 78 U/L (ref 40–150)
BILIRUBIN TOTAL: 0.71 mg/dL (ref 0.20–1.20)
BUN: 12.6 mg/dL (ref 7.0–26.0)
CALCIUM: 8.7 mg/dL (ref 8.4–10.4)
CO2: 24 meq/L (ref 22–29)
CREATININE: 0.8 mg/dL (ref 0.6–1.1)
Chloride: 110 mEq/L — ABNORMAL HIGH (ref 98–109)
EGFR: 68 mL/min/{1.73_m2} — AB (ref >90)
Glucose: 97 mg/dl (ref 70–140)
Potassium: 3.7 mEq/L (ref 3.5–5.1)
SODIUM: 141 meq/L (ref 136–145)
TOTAL PROTEIN: 6.7 g/dL (ref 6.4–8.3)

## 2015-05-31 LAB — CBC WITH DIFFERENTIAL/PLATELET
BASO%: 0.3 % (ref 0.0–2.0)
BASOS ABS: 0 10*3/uL (ref 0.0–0.1)
EOS%: 1.7 % (ref 0.0–7.0)
Eosinophils Absolute: 0.1 10*3/uL (ref 0.0–0.5)
HEMATOCRIT: 36.4 % (ref 34.8–46.6)
HEMOGLOBIN: 11.9 g/dL (ref 11.6–15.9)
LYMPH#: 1.3 10*3/uL (ref 0.9–3.3)
LYMPH%: 36.9 % (ref 14.0–49.7)
MCH: 33.5 pg (ref 25.1–34.0)
MCHC: 32.7 g/dL (ref 31.5–36.0)
MCV: 102.5 fL — ABNORMAL HIGH (ref 79.5–101.0)
MONO#: 0.4 10*3/uL (ref 0.1–0.9)
MONO%: 12.1 % (ref 0.0–14.0)
NEUT%: 49 % (ref 38.4–76.8)
NEUTROS ABS: 1.7 10*3/uL (ref 1.5–6.5)
Platelets: 216 10*3/uL (ref 145–400)
RBC: 3.55 10*6/uL — ABNORMAL LOW (ref 3.70–5.45)
RDW: 14.6 % — AB (ref 11.2–14.5)
WBC: 3.6 10*3/uL — AB (ref 3.9–10.3)

## 2015-05-31 MED ORDER — HEPARIN SOD (PORK) LOCK FLUSH 100 UNIT/ML IV SOLN
500.0000 [IU] | Freq: Once | INTRAVENOUS | Status: AC | PRN
  Administered 2015-05-31: 500 [IU]
  Filled 2015-05-31: qty 5

## 2015-05-31 MED ORDER — SODIUM CHLORIDE 0.9 % IV SOLN
500.0000 mg/m2 | Freq: Once | INTRAVENOUS | Status: AC
  Administered 2015-05-31: 850 mg via INTRAVENOUS
  Filled 2015-05-31: qty 30

## 2015-05-31 MED ORDER — SODIUM CHLORIDE 0.9 % IJ SOLN
10.0000 mL | INTRAMUSCULAR | Status: DC | PRN
Start: 2015-05-31 — End: 2015-05-31
  Administered 2015-05-31: 10 mL
  Filled 2015-05-31: qty 10

## 2015-05-31 MED ORDER — SODIUM CHLORIDE 0.9 % IV SOLN
Freq: Once | INTRAVENOUS | Status: AC
  Administered 2015-05-31: 11:00:00 via INTRAVENOUS
  Filled 2015-05-31: qty 8

## 2015-05-31 MED ORDER — SODIUM CHLORIDE 0.9 % IV SOLN
Freq: Once | INTRAVENOUS | Status: AC
  Administered 2015-05-31: 11:00:00 via INTRAVENOUS

## 2015-05-31 NOTE — Telephone Encounter (Signed)
Patient already on schedule for 3 week and 6 week  Lab.fu.tx per 12/28 pof. Last date in care plan at this time is 2/8. Patient given avs report - no changes made to schedule.

## 2015-05-31 NOTE — Progress Notes (Signed)
[place] Telephone:(336) [phone]   Fax:(336) [place], [place] [address]  Principle Diagnosis and stage: Recurrent non-small cell lung cancer, initially diagnosed a stage IB (T2, N0, M0) adenocarcinoma in June 2008 with a tumor size of 8 cm.   Prior Therapy:  #1 status post left upper lobectomy with lymph node dissection under the care of Dr. Arlyce Dice on 06/29/2006.  #2 status post 4 cycles of adjuvant chemotherapy with cisplatin and Taxotere last dose given 08/01/2006.  #3 status post 6 cycles of systemic chemotherapy with carboplatin and Alimta for disease recurrence last dose given 12/12/2009.   Current therapy: Maintenance chemotherapy with Alimta at 500 mg per meter squared given every 3 weeks status post 76 cycles.  CHEMOTHERAPY INTENT: Palliative/maintenance.  CURRENT # OF CHEMOTHERAPY CYCLES: 77  CURRENT ANTIEMETICS: Zofran, dexamethasone and Compazine  CURRENT SMOKING STATUS: Never Smoker.   ORAL CHEMOTHERAPY AND CONSENT: None   CURRENT BISPHOSPHONATES USE: None  PAIN MANAGEMENT: None  NARCOTICS INDUCED CONSTIPATION: None  LIVING WILL AND CODE STATUS: has living will for no CODE BLUE.  INTERVAL HISTORY: [name] 81 y.o. female returns to the clinic today for follow up visit. The patient has no complaints today. She is tolerating her maintenance chemotherapy with single agent Alimta fairly well. She has mild dry cough started few days ago The patient denied having any significant nausea or vomiting. She has no fever or chills. The patient denied having any significant chest pain, shortness of breath, or hemoptysis. She has no weight loss or night sweats.  She had repeat CT scan of the chest performed recently and she is here today for evaluation before starting cycle #77.   MEDICAL HISTORY: Past Medical History  Diagnosis Date  . History of migraine headaches   .  Hypercholesterolemia   . lung ca dx'd 06/2006    rt and lt lung  . Lung cancer ([facility]) 3/14/1    right- Adenocarcinoma w/bronchioalveolar features  . FHx: chemotherapy 2008&2011    4 cycles cisplatin,taxotere,2008& carboplatin and Alimta 2011    ALLERGIES:  is allergic to simvastatin and iodinated diagnostic agents.  MEDICATIONS:  Current Outpatient Prescriptions  Medication Sig Dispense Refill  . Calcium Carbonate-Vit D-Min (CALTRATE PLUS PO) Take 500 mg by mouth 2 (two) times daily.      . folic acid (FOLVITE) 1 MG tablet Take 1 mg by mouth daily.      . hydrocortisone 2.5 % cream     . Multiple Vitamins-Minerals (CENTRUM SILVER PO) Take 1 tablet by mouth daily.     . potassium gluconate 595 MG TABS Take 595 mg by mouth daily.    . prochlorperazine (COMPAZINE) 10 MG tablet Take 1 tablet (10 mg total) by mouth every 6 (six) hours as needed. 15 tablet 1  . vitamin B-12 (CYANOCOBALAMIN) 500 MCG tablet Take 1,500 mcg by mouth daily.      No current facility-administered medications for this visit.    REVIEW OF SYSTEMS:  Constitutional: negative Eyes: negative Ears, nose, mouth, throat, and face: negative Respiratory: positive for cough Cardiovascular: negative Gastrointestinal: negative Genitourinary:negative Integument/breast: negative Hematologic/lymphatic: negative Musculoskeletal:negative Neurological: negative Behavioral/Psych: negative Endocrine: negative Allergic/Immunologic: negative   PHYSICAL EXAMINATION: General appearance: alert, cooperative and no distress Head: Normocephalic, without obvious abnormality, atraumatic Neck: no adenopathy Lymph nodes: Cervical, supraclavicular, and axillary nodes normal. Resp: clear to auscultation bilaterally Back: symmetric, no curvature. ROM normal. No CVA tenderness.  Cardio: regular rate and rhythm, S1, S2 normal, no murmur, click, rub or gallop GI: soft, non-tender; bowel sounds normal; no masses,  no  organomegaly Extremities: extremities normal, atraumatic, no cyanosis or edema Neurologic: Alert and oriented X 3, normal strength and tone. Normal symmetric reflexes. Normal coordination and gait  ECOG PERFORMANCE STATUS: 0 - Asymptomatic  Blood pressure 137/69, pulse 59, temperature 98.2 F (36.8 C), temperature source Oral, resp. rate 18, height '5\' 2"'$  (1.575 m), weight 143 lb 8 oz (65.091 kg), SpO2 100 %.  LABORATORY DATA: Lab Results  Component Value Date   WBC 3.6* 05/31/2015   HGB 11.9 05/31/2015   HCT 36.4 05/31/2015   MCV 102.5* 05/31/2015   PLT 216 05/31/2015      Chemistry      Component Value Date/Time   NA 141 05/31/2015 0916   NA 139 01/24/2012 1008   NA 140 08/21/2011 1306   K 3.7 05/31/2015 0916   K 4.2 01/24/2012 1008   K 4.1 08/21/2011 1306   CL 108* 11/04/2012 0947   CL 103 01/24/2012 1008   CL 107 08/21/2011 1306   CO2 24 05/31/2015 0916   CO2 28 01/24/2012 1008   CO2 22 08/21/2011 1306   BUN 12.6 05/31/2015 0916   BUN 13 01/24/2012 1008   BUN 11 08/21/2011 1306   CREATININE 0.8 05/31/2015 0916   CREATININE 0.8 01/24/2012 1008   CREATININE 0.81 08/21/2011 1306      Component Value Date/Time   CALCIUM 8.7 05/31/2015 0916   CALCIUM 8.6 01/24/2012 1008   CALCIUM 8.8 08/21/2011 1306   ALKPHOS 78 05/31/2015 0916   ALKPHOS 79 01/24/2012 1008   ALKPHOS 87 08/21/2011 1306   AST 20 05/31/2015 0916   AST 24 01/24/2012 1008   AST 17 08/21/2011 1306   ALT 10 05/31/2015 0916   ALT 21 01/24/2012 1008   ALT 8 08/21/2011 1306   BILITOT 0.71 05/31/2015 0916   BILITOT 0.90 01/24/2012 1008   BILITOT 0.6 08/21/2011 1306       RADIOGRAPHIC STUDIES: Ct Chest Wo Contrast  05/30/2015  CLINICAL DATA:  Restaging bilateral lung cancer. EXAM: CT CHEST WITHOUT CONTRAST TECHNIQUE: Multidetector CT imaging of the chest was performed following the standard protocol without IV contrast. COMPARISON:  CT scans 3/16, 5/25 and 02/14/2015 FINDINGS: Mediastinum/Nodes: The  right-sided Port-A-Cath is stable. No breast masses, supraclavicular or axillary lymphadenopathy. The thyroid gland is grossly normal. The heart is normal in size. No pericardial effusion. Stable tortuosity, ectasia and calcification of the thoracic aorta. Stable coronary artery calcifications. Small scattered mediastinal and hilar lymph nodes. No obvious adenopathy. Possible right suprahilar nodes are difficult to measure because of lack of contrast. The esophagus is grossly normal. Lungs/Pleura: Overall stable right upper lobe lung mass, likely a combination of tumor and atelectatic lung. On image number 19 this measures 44.5 x 33.5 mm. It is unchanged. Irregular peripheral left upper lobe lung lesion on image number 33 has a maximum diameter of 12.5 mm. This is stable. Right middle lobe nodular density on image 36 measures 14 mm. This is stable also. No new pulmonary lesions. Stable left basilar scarring changes. No metastatic pulmonary nodules are identified. Stable surgical changes involving the left hilar area. Upper abdomen: No significant upper abdominal findings. No adrenal gland lesions. Musculoskeletal: No significant bony findings. IMPRESSION: 1. Stable right upper lobe lung mass likely a combination of tumor and atelectasis. This extends to the right hilum but discrete nodes are difficult to measure without contrast.  No mediastinal lymphadenopathy. 2. Stable areas of scarring type change in the right middle lobe and left upper lobe. 3. No findings for metastatic pulmonary disease. 4. Stable tortuous, ectatic and calcified thoracic aorta and stable coronary artery calcifications. 5. No findings for upper abdominal metastatic disease. Electronically Signed   By: Marijo Sanes M.D.   On: 05/30/2015 11:55   ASSESSMENT AND PLAN: This is a very pleasant 81 years old white female with recurrent non-small cell lung cancer, adenocarcinoma. The patient is currently undergoing maintenance chemotherapy with  single agent Alimta status post 76 cycles and she is tolerating it fairly well.  The recent CT scan of the chest showed no evidence for disease progression. I discussed the scan results with the patient today. I recommended for the patient to proceed with her treatment with Alimta as a scheduled today. She will receive cycle #77 today. She will come back for follow-up visit in 3 weeks with the next cycle of her treatment.  She was advised to call immediately if she has any concerning symptoms in the interval.  Disclaimer: This note was dictated with voice recognition software. Similar sounding words can inadvertently be transcribed and may not be corrected upon review.  [name]., MD 05/31/2015

## 2015-05-31 NOTE — Patient Instructions (Signed)
[facility] Discharge Instructions for Patients Receiving Chemotherapy  Today you received the following chemotherapy agents Alimta.  To help prevent nausea and vomiting after your treatment, we encourage you to take your nausea medication as prescribed.   If you develop nausea and vomiting that is not controlled by your nausea medication, call the clinic.   BELOW ARE SYMPTOMS THAT SHOULD BE REPORTED IMMEDIATELY:  *FEVER GREATER THAN 100.5 F  *CHILLS WITH OR WITHOUT FEVER  NAUSEA AND VOMITING THAT IS NOT CONTROLLED WITH YOUR NAUSEA MEDICATION  *UNUSUAL SHORTNESS OF BREATH  *UNUSUAL BRUISING OR BLEEDING  TENDERNESS IN MOUTH AND THROAT WITH OR WITHOUT PRESENCE OF ULCERS  *URINARY PROBLEMS  *BOWEL PROBLEMS  UNUSUAL RASH Items with * indicate a potential emergency and should be followed up as soon as possible.  Feel free to call the clinic you have any questions or concerns. The clinic phone number is [phone].  Please show the CHEMO ALERT CARD at check-in to the Emergency Department and triage nurse.   

## 2015-06-20 ENCOUNTER — Other Ambulatory Visit: Payer: Self-pay | Admitting: Internal Medicine

## 2015-06-20 DIAGNOSIS — C3491 Malignant neoplasm of unspecified part of right bronchus or lung: Secondary | ICD-10-CM

## 2015-06-20 DIAGNOSIS — C3492 Malignant neoplasm of unspecified part of left bronchus or lung: Principal | ICD-10-CM

## 2015-06-21 ENCOUNTER — Ambulatory Visit (HOSPITAL_BASED_OUTPATIENT_CLINIC_OR_DEPARTMENT_OTHER): Payer: Medicare Other | Admitting: Internal Medicine

## 2015-06-21 ENCOUNTER — Ambulatory Visit (HOSPITAL_BASED_OUTPATIENT_CLINIC_OR_DEPARTMENT_OTHER): Payer: Medicare Other

## 2015-06-21 ENCOUNTER — Other Ambulatory Visit (HOSPITAL_BASED_OUTPATIENT_CLINIC_OR_DEPARTMENT_OTHER): Payer: Medicare Other

## 2015-06-21 ENCOUNTER — Encounter: Payer: Self-pay | Admitting: Internal Medicine

## 2015-06-21 ENCOUNTER — Telehealth: Payer: Self-pay | Admitting: Internal Medicine

## 2015-06-21 DIAGNOSIS — C3492 Malignant neoplasm of unspecified part of left bronchus or lung: Principal | ICD-10-CM

## 2015-06-21 DIAGNOSIS — C3491 Malignant neoplasm of unspecified part of right bronchus or lung: Secondary | ICD-10-CM

## 2015-06-21 DIAGNOSIS — Z5111 Encounter for antineoplastic chemotherapy: Secondary | ICD-10-CM

## 2015-06-21 DIAGNOSIS — D709 Neutropenia, unspecified: Secondary | ICD-10-CM | POA: Diagnosis not present

## 2015-06-21 DIAGNOSIS — C3412 Malignant neoplasm of upper lobe, left bronchus or lung: Secondary | ICD-10-CM

## 2015-06-21 LAB — COMPREHENSIVE METABOLIC PANEL
AST: 19 U/L (ref 5–34)
Albumin: 3.3 g/dL — ABNORMAL LOW (ref 3.5–5.0)
Alkaline Phosphatase: 72 U/L (ref 40–150)
Anion Gap: 7 mEq/L (ref 3–11)
BILIRUBIN TOTAL: 0.41 mg/dL (ref 0.20–1.20)
BUN: 15.4 mg/dL (ref 7.0–26.0)
CALCIUM: 8.8 mg/dL (ref 8.4–10.4)
CHLORIDE: 108 meq/L (ref 98–109)
CO2: 24 meq/L (ref 22–29)
CREATININE: 0.8 mg/dL (ref 0.6–1.1)
EGFR: 65 mL/min/{1.73_m2} — ABNORMAL LOW (ref >90)
Glucose: 86 mg/dl (ref 70–140)
Potassium: 4 mEq/L (ref 3.5–5.1)
Sodium: 140 mEq/L (ref 136–145)
TOTAL PROTEIN: 6.8 g/dL (ref 6.4–8.3)

## 2015-06-21 LAB — CBC WITH DIFFERENTIAL/PLATELET
BASO%: 0.9 % (ref 0.0–2.0)
Basophils Absolute: 0 10*3/uL (ref 0.0–0.1)
EOS%: 1 % (ref 0.0–7.0)
Eosinophils Absolute: 0 10*3/uL (ref 0.0–0.5)
HEMATOCRIT: 36.8 % (ref 34.8–46.6)
HGB: 11.9 g/dL (ref 11.6–15.9)
LYMPH#: 1.1 10*3/uL (ref 0.9–3.3)
LYMPH%: 38.3 % (ref 14.0–49.7)
MCH: 32.7 pg (ref 25.1–34.0)
MCHC: 32.3 g/dL (ref 31.5–36.0)
MCV: 101.4 fL — ABNORMAL HIGH (ref 79.5–101.0)
MONO#: 0.4 10*3/uL (ref 0.1–0.9)
MONO%: 13.1 % (ref 0.0–14.0)
NEUT%: 46.7 % (ref 38.4–76.8)
NEUTROS ABS: 1.4 10*3/uL — AB (ref 1.5–6.5)
PLATELETS: 279 10*3/uL (ref 145–400)
RBC: 3.63 10*6/uL — AB (ref 3.70–5.45)
RDW: 14.5 % (ref 11.2–14.5)
WBC: 2.9 10*3/uL — AB (ref 3.9–10.3)

## 2015-06-21 MED ORDER — SODIUM CHLORIDE 0.9 % IV SOLN
Freq: Once | INTRAVENOUS | Status: AC
  Administered 2015-06-21: 10:00:00 via INTRAVENOUS
  Filled 2015-06-21: qty 8

## 2015-06-21 MED ORDER — CYANOCOBALAMIN 1000 MCG/ML IJ SOLN
1000.0000 ug | Freq: Once | INTRAMUSCULAR | Status: AC
  Administered 2015-06-21: 1000 ug via INTRAMUSCULAR

## 2015-06-21 MED ORDER — SODIUM CHLORIDE 0.9 % IV SOLN
500.0000 mg/m2 | Freq: Once | INTRAVENOUS | Status: AC
  Administered 2015-06-21: 850 mg via INTRAVENOUS
  Filled 2015-06-21: qty 34

## 2015-06-21 MED ORDER — SODIUM CHLORIDE 0.9 % IV SOLN
Freq: Once | INTRAVENOUS | Status: AC
  Administered 2015-06-21: 10:00:00 via INTRAVENOUS

## 2015-06-21 MED ORDER — CYANOCOBALAMIN 1000 MCG/ML IJ SOLN
INTRAMUSCULAR | Status: AC
Start: 2015-06-21 — End: 2015-06-21
  Filled 2015-06-21: qty 1

## 2015-06-21 MED ORDER — SODIUM CHLORIDE 0.9 % IJ SOLN
10.0000 mL | INTRAMUSCULAR | Status: DC | PRN
  Administered 2015-06-21: 10 mL
  Filled 2015-06-21: qty 10

## 2015-06-21 MED ORDER — HEPARIN SOD (PORK) LOCK FLUSH 100 UNIT/ML IV SOLN
500.0000 [IU] | Freq: Once | INTRAVENOUS | Status: AC | PRN
  Administered 2015-06-21: 500 [IU]
  Filled 2015-06-21: qty 5

## 2015-06-21 NOTE — Telephone Encounter (Signed)
Gave patient avs report and appointments for Feb/March.

## 2015-06-21 NOTE — Progress Notes (Signed)
[place] [phone]   Fax:(336) [place], [place] [address]  Principle Diagnosis and stage: Recurrent non-small cell lung cancer, initially diagnosed a stage IB (T2, N0, M0) adenocarcinoma in June 2008 with a tumor size of 8 cm.   Prior Therapy:  #1 status post left upper lobectomy with lymph node dissection under the care of Dr. Arlyce Dice on 06/29/2006.  #2 status post 4 cycles of adjuvant chemotherapy with cisplatin and Taxotere last dose given 08/01/2006.  #3 status post 6 cycles of systemic chemotherapy with carboplatin and Alimta for disease recurrence last dose given 12/12/2009.   Current therapy: Maintenance chemotherapy with Alimta at 500 mg per meter squared given every 3 weeks status post 77 cycles.  CHEMOTHERAPY INTENT: Palliative/maintenance.  CURRENT # OF CHEMOTHERAPY CYCLES: 78  CURRENT ANTIEMETICS: Zofran, dexamethasone and Compazine  CURRENT SMOKING STATUS: Never Smoker.   ORAL CHEMOTHERAPY AND CONSENT: None   CURRENT BISPHOSPHONATES USE: None  PAIN MANAGEMENT: None  NARCOTICS INDUCED CONSTIPATION: None  LIVING WILL AND CODE STATUS: has living will for no CODE BLUE.  INTERVAL HISTORY: [name] 82 y.o. female returns to the clinic today for follow up visit. The patient has no complaints today. She is tolerating her maintenance chemotherapy with single agent Alimta fairly well. She was treated for upper respiratory infection 2 weeks ago with Levaquin and she is feeling much better. She has mild nonproductive cough. The patient denied having any significant nausea or vomiting. She has no fever or chills. The patient denied having any significant chest pain, shortness of breath, or hemoptysis. She has no weight loss or night sweats. She is here today for evaluation before starting cycle #78 of her maintenance chemotherapy.   MEDICAL HISTORY: Past Medical  History  Diagnosis Date  . History of migraine headaches   . Hypercholesterolemia   . lung ca dx'd 06/2006    rt and lt lung  . Lung cancer ([place]) 3/14/1    right- Adenocarcinoma w/bronchioalveolar features  . FHx: chemotherapy 2008&2011    4 cycles cisplatin,taxotere,2008& carboplatin and Alimta 2011    ALLERGIES:  is allergic to simvastatin and iodinated diagnostic agents.  MEDICATIONS:  Current Outpatient Prescriptions  Medication Sig Dispense Refill  . Calcium Carbonate-Vit D-Min (CALTRATE PLUS PO) Take 500 mg by mouth 2 (two) times daily.      . folic acid (FOLVITE) 1 MG tablet Take 1 mg by mouth daily.      . hydrocortisone 2.5 % cream     . Multiple Vitamins-Minerals (CENTRUM SILVER PO) Take 1 tablet by mouth daily.     . potassium gluconate 595 MG TABS Take 595 mg by mouth daily.    . prochlorperazine (COMPAZINE) 10 MG tablet Take 1 tablet (10 mg total) by mouth every 6 (six) hours as needed. 15 tablet 1  . vitamin B-12 (CYANOCOBALAMIN) 500 MCG tablet Take 1,500 mcg by mouth daily.      No current facility-administered medications for this visit.    REVIEW OF SYSTEMS:  A comprehensive review of systems was negative except for: Respiratory: positive for cough   PHYSICAL EXAMINATION: General appearance: alert, cooperative and no distress Head: Normocephalic, without obvious abnormality, atraumatic Neck: no adenopathy Lymph nodes: Cervical, supraclavicular, and axillary nodes normal. Resp: clear to auscultation bilaterally Back: symmetric, no curvature. ROM normal. No CVA tenderness. Cardio: regular rate and rhythm, S1, S2 normal, no murmur, click, rub or gallop  GI: soft, non-tender; bowel sounds normal; no masses,  no organomegaly Extremities: extremities normal, atraumatic, no cyanosis or edema Neurologic: Alert and oriented X 3, normal strength and tone. Normal symmetric reflexes. Normal coordination and gait  ECOG PERFORMANCE STATUS: 0 - Asymptomatic  There were no  vitals taken for this visit.  LABORATORY DATA: Lab Results  Component Value Date   WBC 2.9* 06/21/2015   HGB 11.9 06/21/2015   HCT 36.8 06/21/2015   MCV 101.4* 06/21/2015   PLT 279 06/21/2015      Chemistry      Component Value Date/Time   NA 141 05/31/2015 0916   NA 139 01/24/2012 1008   NA 140 08/21/2011 1306   K 3.7 05/31/2015 0916   K 4.2 01/24/2012 1008   K 4.1 08/21/2011 1306   CL 108* 11/04/2012 0947   CL 103 01/24/2012 1008   CL 107 08/21/2011 1306   CO2 24 05/31/2015 0916   CO2 28 01/24/2012 1008   CO2 22 08/21/2011 1306   BUN 12.6 05/31/2015 0916   BUN 13 01/24/2012 1008   BUN 11 08/21/2011 1306   CREATININE 0.8 05/31/2015 0916   CREATININE 0.8 01/24/2012 1008   CREATININE 0.81 08/21/2011 1306      Component Value Date/Time   CALCIUM 8.7 05/31/2015 0916   CALCIUM 8.6 01/24/2012 1008   CALCIUM 8.8 08/21/2011 1306   ALKPHOS 78 05/31/2015 0916   ALKPHOS 79 01/24/2012 1008   ALKPHOS 87 08/21/2011 1306   AST 20 05/31/2015 0916   AST 24 01/24/2012 1008   AST 17 08/21/2011 1306   ALT 10 05/31/2015 0916   ALT 21 01/24/2012 1008   ALT 8 08/21/2011 1306   BILITOT 0.71 05/31/2015 0916   BILITOT 0.90 01/24/2012 1008   BILITOT 0.6 08/21/2011 1306       RADIOGRAPHIC STUDIES: Ct Chest Wo Contrast  05/30/2015  CLINICAL DATA:  Restaging bilateral lung cancer. EXAM: CT CHEST WITHOUT CONTRAST TECHNIQUE: Multidetector CT imaging of the chest was performed following the standard protocol without IV contrast. COMPARISON:  CT scans 3/16, 5/25 and 02/14/2015 FINDINGS: Mediastinum/Nodes: The right-sided Port-A-Cath is stable. No breast masses, supraclavicular or axillary lymphadenopathy. The thyroid gland is grossly normal. The heart is normal in size. No pericardial effusion. Stable tortuosity, ectasia and calcification of the thoracic aorta. Stable coronary artery calcifications. Small scattered mediastinal and hilar lymph nodes. No obvious adenopathy. Possible right  suprahilar nodes are difficult to measure because of lack of contrast. The esophagus is grossly normal. Lungs/Pleura: Overall stable right upper lobe lung mass, likely a combination of tumor and atelectatic lung. On image number 19 this measures 44.5 x 33.5 mm. It is unchanged. Irregular peripheral left upper lobe lung lesion on image number 33 has a maximum diameter of 12.5 mm. This is stable. Right middle lobe nodular density on image 36 measures 14 mm. This is stable also. No new pulmonary lesions. Stable left basilar scarring changes. No metastatic pulmonary nodules are identified. Stable surgical changes involving the left hilar area. Upper abdomen: No significant upper abdominal findings. No adrenal gland lesions. Musculoskeletal: No significant bony findings. IMPRESSION: 1. Stable right upper lobe lung mass likely a combination of tumor and atelectasis. This extends to the right hilum but discrete nodes are difficult to measure without contrast. No mediastinal lymphadenopathy. 2. Stable areas of scarring type change in the right middle lobe and left upper lobe. 3. No findings for metastatic pulmonary disease. 4. Stable tortuous, ectatic and calcified thoracic aorta and stable coronary  artery calcifications. 5. No findings for upper abdominal metastatic disease. Electronically Signed   By: Marijo Sanes M.D.   On: 05/30/2015 11:55   ASSESSMENT AND PLAN: This is a very pleasant 82 years old white female with recurrent non-small cell lung cancer, adenocarcinoma. The patient is currently undergoing maintenance chemotherapy with single agent Alimta status post 77 cycles and she is tolerating it fairly well.  She has mild neutropenia but her count is good enough to proceed with the chemotherapy today. I recommended for the patient to proceed with her treatment with Alimta as a scheduled today. She will receive cycle #78 today. She will come back for follow-up visit in 3 weeks with the next cycle of her  treatment.  She was advised to call immediately if she has any concerning symptoms in the interval.  Disclaimer: This note was dictated with voice recognition software. Similar sounding words can inadvertently be transcribed and may not be corrected upon review.  [name]., MD 06/21/2015

## 2015-06-21 NOTE — Patient Instructions (Signed)
[facility] Discharge Instructions for Patients Receiving Chemotherapy  Today you received the following chemotherapy agents Alimta. To help prevent nausea and vomiting after your treatment, we encourage you to take your nausea medication as directed.   If you develop nausea and vomiting that is not controlled by your nausea medication, call the clinic.   BELOW ARE SYMPTOMS THAT SHOULD BE REPORTED IMMEDIATELY:  *FEVER GREATER THAN 100.5 F  *CHILLS WITH OR WITHOUT FEVER  NAUSEA AND VOMITING THAT IS NOT CONTROLLED WITH YOUR NAUSEA MEDICATION  *UNUSUAL SHORTNESS OF BREATH  *UNUSUAL BRUISING OR BLEEDING  TENDERNESS IN MOUTH AND THROAT WITH OR WITHOUT PRESENCE OF ULCERS  *URINARY PROBLEMS  *BOWEL PROBLEMS  UNUSUAL RASH Items with * indicate a potential emergency and should be followed up as soon as possible.  Feel free to call the clinic you have any questions or concerns. The clinic phone number is [phone].  Please show the [place] at check-in to the Emergency Department and triage nurse.  Cyanocobalamin, Vitamin B12 injection What is this medicine? CYANOCOBALAMIN (sye an oh koe BAL a min) is a man made form of vitamin B12. Vitamin B12 is used in the growth of healthy blood cells, nerve cells, and proteins in the body. It also helps with the metabolism of fats and carbohydrates. This medicine is used to treat people who can not absorb vitamin B12. This medicine may be used for other purposes; ask your health care provider or pharmacist if you have questions. What should I tell my health care provider before I take this medicine? They need to know if you have any of these conditions: -kidney disease -Leber's disease -megaloblastic anemia -an unusual or allergic reaction to cyanocobalamin, cobalt, other medicines, foods, dyes, or preservatives -pregnant or trying to get pregnant -breast-feeding How should I use this medicine? This medicine is  injected into a muscle or deeply under the skin. It is usually given by a health care professional in a clinic or doctor's office. However, your doctor may teach you how to inject yourself. Follow all instructions. Talk to your pediatrician regarding the use of this medicine in children. Special care may be needed. Overdosage: If you think you have taken too much of this medicine contact a poison control center or emergency room at once. NOTE: This medicine is only for you. Do not share this medicine with others. What if I miss a dose? If you are given your dose at a clinic or doctor's office, call to reschedule your appointment. If you give your own injections and you miss a dose, take it as soon as you can. If it is almost time for your next dose, take only that dose. Do not take double or extra doses. What may interact with this medicine? -colchicine -heavy alcohol intake This list may not describe all possible interactions. Give your health care provider a list of all the medicines, herbs, non-prescription drugs, or dietary supplements you use. Also tell them if you smoke, drink alcohol, or use illegal drugs. Some items may interact with your medicine. What should I watch for while using this medicine? Visit your doctor or health care professional regularly. You may need blood work done while you are taking this medicine. You may need to follow a special diet. Talk to your doctor. Limit your alcohol intake and avoid smoking to get the best benefit. What side effects may I notice from receiving this medicine? Side effects that you should report to your doctor or  health care professional as soon as possible: -allergic reactions like skin rash, itching or hives, swelling of the face, lips, or tongue -blue tint to skin -chest tightness, pain -difficulty breathing, wheezing -dizziness -red, swollen painful area on the leg Side effects that usually do not require medical attention (report to your  doctor or health care professional if they continue or are bothersome): -diarrhea -headache This list may not describe all possible side effects. Call your doctor for medical advice about side effects. You may report side effects to FDA at 1-800-FDA-1088. Where should I keep my medicine? Keep out of the reach of children. Store at room temperature between 15 and 30 degrees C (59 and 85 degrees F). Protect from light. Throw away any unused medicine after the expiration date. NOTE: This sheet is a summary. It may not cover all possible information. If you have questions about this medicine, talk to your doctor, pharmacist, or health care provider.    2016, [employer]. (2007-08-31 22:10:20)

## 2015-06-21 NOTE — Progress Notes (Signed)
Per Julien Nordmann it is okay to treat pt today with chemo and todays labs.

## 2015-07-12 ENCOUNTER — Other Ambulatory Visit (HOSPITAL_BASED_OUTPATIENT_CLINIC_OR_DEPARTMENT_OTHER): Payer: Medicare Other

## 2015-07-12 ENCOUNTER — Encounter: Payer: Self-pay | Admitting: Internal Medicine

## 2015-07-12 ENCOUNTER — Ambulatory Visit (HOSPITAL_BASED_OUTPATIENT_CLINIC_OR_DEPARTMENT_OTHER): Payer: Medicare Other | Admitting: Internal Medicine

## 2015-07-12 ENCOUNTER — Telehealth: Payer: Self-pay | Admitting: Internal Medicine

## 2015-07-12 ENCOUNTER — Telehealth: Payer: Self-pay | Admitting: *Deleted

## 2015-07-12 ENCOUNTER — Ambulatory Visit: Payer: Medicare Other

## 2015-07-12 VITALS — BP 147/67 | HR 74 | Temp 98.3°F | Resp 18 | Ht 62.0 in | Wt 140.7 lb

## 2015-07-12 DIAGNOSIS — C3412 Malignant neoplasm of upper lobe, left bronchus or lung: Secondary | ICD-10-CM

## 2015-07-12 DIAGNOSIS — D709 Neutropenia, unspecified: Secondary | ICD-10-CM | POA: Diagnosis not present

## 2015-07-12 DIAGNOSIS — Z5111 Encounter for antineoplastic chemotherapy: Secondary | ICD-10-CM

## 2015-07-12 DIAGNOSIS — C3492 Malignant neoplasm of unspecified part of left bronchus or lung: Principal | ICD-10-CM

## 2015-07-12 DIAGNOSIS — C3491 Malignant neoplasm of unspecified part of right bronchus or lung: Secondary | ICD-10-CM

## 2015-07-12 LAB — CBC WITH DIFFERENTIAL/PLATELET
BASO%: 0.3 % (ref 0.0–2.0)
BASOS ABS: 0 10*3/uL (ref 0.0–0.1)
EOS%: 1.3 % (ref 0.0–7.0)
Eosinophils Absolute: 0 10*3/uL (ref 0.0–0.5)
HEMATOCRIT: 36.3 % (ref 34.8–46.6)
HGB: 12 g/dL (ref 11.6–15.9)
LYMPH#: 1.6 10*3/uL (ref 0.9–3.3)
LYMPH%: 52.6 % — AB (ref 14.0–49.7)
MCH: 33.4 pg (ref 25.1–34.0)
MCHC: 33.1 g/dL (ref 31.5–36.0)
MCV: 101.1 fL — ABNORMAL HIGH (ref 79.5–101.0)
MONO#: 0.5 10*3/uL (ref 0.1–0.9)
MONO%: 15.7 % — ABNORMAL HIGH (ref 0.0–14.0)
NEUT#: 0.9 10*3/uL — ABNORMAL LOW (ref 1.5–6.5)
NEUT%: 30.1 % — AB (ref 38.4–76.8)
PLATELETS: 228 10*3/uL (ref 145–400)
RBC: 3.59 10*6/uL — AB (ref 3.70–5.45)
RDW: 14.5 % (ref 11.2–14.5)
WBC: 3.1 10*3/uL — ABNORMAL LOW (ref 3.9–10.3)

## 2015-07-12 LAB — COMPREHENSIVE METABOLIC PANEL
ALT: 10 U/L (ref 0–55)
ANION GAP: 10 meq/L (ref 3–11)
AST: 21 U/L (ref 5–34)
Albumin: 3.3 g/dL — ABNORMAL LOW (ref 3.5–5.0)
Alkaline Phosphatase: 78 U/L (ref 40–150)
BUN: 13.1 mg/dL (ref 7.0–26.0)
CALCIUM: 9 mg/dL (ref 8.4–10.4)
CHLORIDE: 110 meq/L — AB (ref 98–109)
CO2: 22 meq/L (ref 22–29)
Creatinine: 0.8 mg/dL (ref 0.6–1.1)
EGFR: 67 mL/min/{1.73_m2} — AB (ref >90)
Glucose: 61 mg/dl — ABNORMAL LOW (ref 70–140)
POTASSIUM: 4.1 meq/L (ref 3.5–5.1)
Sodium: 142 mEq/L (ref 136–145)
Total Bilirubin: 0.64 mg/dL (ref 0.20–1.20)
Total Protein: 6.8 g/dL (ref 6.4–8.3)

## 2015-07-12 NOTE — Telephone Encounter (Signed)
per pof to sch pt appt-cld MW sch trmts-per MM ok to sch 1day earlier-gave pt updated copy of avs

## 2015-07-12 NOTE — Telephone Encounter (Signed)
Per staff message and POF I have scheduled appts. Advised scheduler of appts. JMW  

## 2015-07-12 NOTE — Progress Notes (Signed)
[place] [phone]   Fax:(336) [place], MD [place] [address]  Principle Diagnosis and stage: Recurrent non-small cell lung cancer, initially diagnosed a stage IB (T2, N0, M0) adenocarcinoma in June 2008 with a tumor size of 8 cm.   Prior Therapy:  #1 status post left upper lobectomy with lymph node dissection under the care of Dr. Arlyce Dice on 06/29/2006.  #2 status post 4 cycles of adjuvant chemotherapy with cisplatin and Taxotere last dose given 08/01/2006.  #3 status post 6 cycles of systemic chemotherapy with carboplatin and Alimta for disease recurrence last dose given 12/12/2009.   Current therapy: Maintenance chemotherapy with Alimta at 500 mg per meter squared given every 3 weeks status post 78 cycles.  CHEMOTHERAPY INTENT: Palliative/maintenance.  CURRENT # OF CHEMOTHERAPY CYCLES: 79  CURRENT ANTIEMETICS: Zofran, dexamethasone and Compazine  CURRENT SMOKING STATUS: Never Smoker.   ORAL CHEMOTHERAPY AND CONSENT: None   CURRENT BISPHOSPHONATES USE: None  PAIN MANAGEMENT: None  NARCOTICS INDUCED CONSTIPATION: None  LIVING WILL AND CODE STATUS: has living will for no CODE BLUE.  INTERVAL HISTORY: [name] 82 y.o. female returns to the clinic today for follow up visit. The patient has no complaints today. She is tolerating her maintenance chemotherapy with single agent Alimta fairly well. The patient denied having any significant nausea or vomiting. She has no fever or chills. The patient denied having any significant chest pain, shortness of breath, or hemoptysis. She has no weight loss or night sweats. She is here today for evaluation before starting cycle #78 of her maintenance chemotherapy.   MEDICAL HISTORY: Past Medical History  Diagnosis Date  . History of migraine headaches   . Hypercholesterolemia   . lung ca dx'd 06/2006    rt and lt lung  . Lung cancer  ([place]) 3/14/1    right- Adenocarcinoma w/bronchioalveolar features  . FHx: chemotherapy 2008&2011    4 cycles cisplatin,taxotere,2008& carboplatin and Alimta 2011    ALLERGIES:  is allergic to simvastatin and iodinated diagnostic agents.  MEDICATIONS:  Current Outpatient Prescriptions  Medication Sig Dispense Refill  . Calcium Carbonate-Vit D-Min (CALTRATE PLUS PO) Take 500 mg by mouth 2 (two) times daily.      . chlorpheniramine-HYDROcodone (TUSSIONEX) 10-8 MG/5ML SUER Take 5 mLs by mouth 2 (two) times daily.  0  . folic acid (FOLVITE) 1 MG tablet Take 1 mg by mouth daily.      . hydrocortisone 2.5 % cream     . Multiple Vitamins-Minerals (CENTRUM SILVER PO) Take 1 tablet by mouth daily.     . potassium gluconate 595 MG TABS Take 595 mg by mouth daily.    . prochlorperazine (COMPAZINE) 10 MG tablet Take 1 tablet (10 mg total) by mouth every 6 (six) hours as needed. 15 tablet 1  . vitamin B-12 (CYANOCOBALAMIN) 500 MCG tablet Take 1,500 mcg by mouth daily.      No current facility-administered medications for this visit.    REVIEW OF SYSTEMS:  A comprehensive review of systems was negative.   PHYSICAL EXAMINATION: General appearance: alert, cooperative and no distress Head: Normocephalic, without obvious abnormality, atraumatic Neck: no adenopathy Lymph nodes: Cervical, supraclavicular, and axillary nodes normal. Resp: clear to auscultation bilaterally Back: symmetric, no curvature. ROM normal. No CVA tenderness. Cardio: regular rate and rhythm, S1, S2 normal, no murmur, click, rub or gallop GI: soft, non-tender; bowel sounds normal; no masses,  no organomegaly  Extremities: extremities normal, atraumatic, no cyanosis or edema Neurologic: Alert and oriented X 3, normal strength and tone. Normal symmetric reflexes. Normal coordination and gait  ECOG PERFORMANCE STATUS: 0 - Asymptomatic  Blood pressure 147/67, pulse 74, temperature 98.3 F (36.8 C), temperature source Oral, resp.  rate 18, height '5\' 2"'$  (1.575 m), weight 140 lb 11.2 oz (63.821 kg), SpO2 100 %.  LABORATORY DATA: Lab Results  Component Value Date   WBC 3.1* 07/12/2015   HGB 12.0 07/12/2015   HCT 36.3 07/12/2015   MCV 101.1* 07/12/2015   PLT 228 07/12/2015      Chemistry      Component Value Date/Time   NA 140 06/21/2015 0857   NA 139 01/24/2012 1008   NA 140 08/21/2011 1306   K 4.0 06/21/2015 0857   K 4.2 01/24/2012 1008   K 4.1 08/21/2011 1306   CL 108* 11/04/2012 0947   CL 103 01/24/2012 1008   CL 107 08/21/2011 1306   CO2 24 06/21/2015 0857   CO2 28 01/24/2012 1008   CO2 22 08/21/2011 1306   BUN 15.4 06/21/2015 0857   BUN 13 01/24/2012 1008   BUN 11 08/21/2011 1306   CREATININE 0.8 06/21/2015 0857   CREATININE 0.8 01/24/2012 1008   CREATININE 0.81 08/21/2011 1306      Component Value Date/Time   CALCIUM 8.8 06/21/2015 0857   CALCIUM 8.6 01/24/2012 1008   CALCIUM 8.8 08/21/2011 1306   ALKPHOS 72 06/21/2015 0857   ALKPHOS 79 01/24/2012 1008   ALKPHOS 87 08/21/2011 1306   AST 19 06/21/2015 0857   AST 24 01/24/2012 1008   AST 17 08/21/2011 1306   ALT <9 06/21/2015 0857   ALT 21 01/24/2012 1008   ALT 8 08/21/2011 1306   BILITOT 0.41 06/21/2015 0857   BILITOT 0.90 01/24/2012 1008   BILITOT 0.6 08/21/2011 1306       RADIOGRAPHIC STUDIES: No results found. ASSESSMENT AND PLAN: This is a very pleasant 82 years old white female with recurrent non-small cell lung cancer, adenocarcinoma. The patient is currently undergoing maintenance chemotherapy with single agent Alimta status post 78 cycles and she is tolerating it fairly well.  Her absolute neutrophil count is low today and the patient does not meet parameters for treatment. I will delay the start of cycle #79 by 1 week until improvement of her absolute neutrophil count. I would see the patient back for follow-up visit in 4 weeks for evaluation before starting cycle #80.  She was advised to call immediately if she has any  concerning symptoms in the interval.  Disclaimer: This note was dictated with voice recognition software. Similar sounding words can inadvertently be transcribed and may not be corrected upon review.  [name]., MD 07/12/2015

## 2015-07-18 ENCOUNTER — Ambulatory Visit (HOSPITAL_BASED_OUTPATIENT_CLINIC_OR_DEPARTMENT_OTHER): Payer: Medicare Other

## 2015-07-18 ENCOUNTER — Other Ambulatory Visit (HOSPITAL_BASED_OUTPATIENT_CLINIC_OR_DEPARTMENT_OTHER): Payer: Medicare Other

## 2015-07-18 VITALS — BP 141/92 | HR 73 | Temp 98.0°F

## 2015-07-18 DIAGNOSIS — C3412 Malignant neoplasm of upper lobe, left bronchus or lung: Secondary | ICD-10-CM

## 2015-07-18 DIAGNOSIS — C3492 Malignant neoplasm of unspecified part of left bronchus or lung: Principal | ICD-10-CM

## 2015-07-18 DIAGNOSIS — C3491 Malignant neoplasm of unspecified part of right bronchus or lung: Secondary | ICD-10-CM

## 2015-07-18 DIAGNOSIS — Z5111 Encounter for antineoplastic chemotherapy: Secondary | ICD-10-CM | POA: Diagnosis not present

## 2015-07-18 LAB — CBC WITH DIFFERENTIAL/PLATELET
BASO%: 0.6 % (ref 0.0–2.0)
Basophils Absolute: 0 10*3/uL (ref 0.0–0.1)
EOS%: 1.1 % (ref 0.0–7.0)
Eosinophils Absolute: 0 10*3/uL (ref 0.0–0.5)
HCT: 37.6 % (ref 34.8–46.6)
HEMOGLOBIN: 12.2 g/dL (ref 11.6–15.9)
LYMPH%: 43.3 % (ref 14.0–49.7)
MCH: 32.9 pg (ref 25.1–34.0)
MCHC: 32.4 g/dL (ref 31.5–36.0)
MCV: 101.5 fL — AB (ref 79.5–101.0)
MONO#: 0.4 10*3/uL (ref 0.1–0.9)
MONO%: 9.6 % (ref 0.0–14.0)
NEUT%: 45.4 % (ref 38.4–76.8)
NEUTROS ABS: 2 10*3/uL (ref 1.5–6.5)
Platelets: 228 10*3/uL (ref 145–400)
RBC: 3.7 10*6/uL (ref 3.70–5.45)
RDW: 14.7 % — AB (ref 11.2–14.5)
WBC: 4.3 10*3/uL (ref 3.9–10.3)
lymph#: 1.9 10*3/uL (ref 0.9–3.3)

## 2015-07-18 LAB — COMPREHENSIVE METABOLIC PANEL
ALBUMIN: 3.3 g/dL — AB (ref 3.5–5.0)
ALK PHOS: 86 U/L (ref 40–150)
ALT: 11 U/L (ref 0–55)
AST: 20 U/L (ref 5–34)
Anion Gap: 10 mEq/L (ref 3–11)
BILIRUBIN TOTAL: 0.41 mg/dL (ref 0.20–1.20)
BUN: 15.3 mg/dL (ref 7.0–26.0)
CO2: 24 meq/L (ref 22–29)
Calcium: 9 mg/dL (ref 8.4–10.4)
Chloride: 107 mEq/L (ref 98–109)
Creatinine: 0.9 mg/dL (ref 0.6–1.1)
EGFR: 63 mL/min/{1.73_m2} — AB (ref >90)
GLUCOSE: 113 mg/dL (ref 70–140)
POTASSIUM: 3.9 meq/L (ref 3.5–5.1)
SODIUM: 140 meq/L (ref 136–145)
TOTAL PROTEIN: 6.9 g/dL (ref 6.4–8.3)

## 2015-07-18 MED ORDER — SODIUM CHLORIDE 0.9 % IV SOLN
500.0000 mg/m2 | Freq: Once | INTRAVENOUS | Status: AC
  Administered 2015-07-18: 850 mg via INTRAVENOUS
  Filled 2015-07-18: qty 30

## 2015-07-18 MED ORDER — HEPARIN SOD (PORK) LOCK FLUSH 100 UNIT/ML IV SOLN
500.0000 [IU] | Freq: Once | INTRAVENOUS | Status: AC | PRN
  Administered 2015-07-18: 500 [IU]
  Filled 2015-07-18: qty 5

## 2015-07-18 MED ORDER — SODIUM CHLORIDE 0.9 % IJ SOLN
10.0000 mL | INTRAMUSCULAR | Status: DC | PRN
  Administered 2015-07-18: 10 mL
  Filled 2015-07-18: qty 10

## 2015-07-18 MED ORDER — SODIUM CHLORIDE 0.9 % IV SOLN
Freq: Once | INTRAVENOUS | Status: AC
  Administered 2015-07-18: 15:00:00 via INTRAVENOUS
  Filled 2015-07-18: qty 8

## 2015-07-18 MED ORDER — SODIUM CHLORIDE 0.9 % IV SOLN
Freq: Once | INTRAVENOUS | Status: AC
  Administered 2015-07-18: 15:00:00 via INTRAVENOUS

## 2015-07-18 NOTE — Patient Instructions (Signed)
[facility] Discharge Instructions for Patients Receiving Chemotherapy  Today you received the following chemotherapy agents Alimta.  To help prevent nausea and vomiting after your treatment, we encourage you to take your nausea medication as prescribed.   If you develop nausea and vomiting that is not controlled by your nausea medication, call the clinic.   BELOW ARE SYMPTOMS THAT SHOULD BE REPORTED IMMEDIATELY:  *FEVER GREATER THAN 100.5 F  *CHILLS WITH OR WITHOUT FEVER  NAUSEA AND VOMITING THAT IS NOT CONTROLLED WITH YOUR NAUSEA MEDICATION  *UNUSUAL SHORTNESS OF BREATH  *UNUSUAL BRUISING OR BLEEDING  TENDERNESS IN MOUTH AND THROAT WITH OR WITHOUT PRESENCE OF ULCERS  *URINARY PROBLEMS  *BOWEL PROBLEMS  UNUSUAL RASH Items with * indicate a potential emergency and should be followed up as soon as possible.  Feel free to call the clinic you have any questions or concerns. The clinic phone number is [phone].  Please show the CHEMO ALERT CARD at check-in to the Emergency Department and triage nurse.   

## 2015-08-02 ENCOUNTER — Other Ambulatory Visit: Payer: Medicare Other

## 2015-08-02 ENCOUNTER — Ambulatory Visit: Payer: Medicare Other

## 2015-08-02 ENCOUNTER — Ambulatory Visit: Payer: Medicare Other | Admitting: Internal Medicine

## 2015-08-08 ENCOUNTER — Other Ambulatory Visit (HOSPITAL_BASED_OUTPATIENT_CLINIC_OR_DEPARTMENT_OTHER): Payer: Medicare Other

## 2015-08-08 ENCOUNTER — Telehealth: Payer: Self-pay | Admitting: Internal Medicine

## 2015-08-08 ENCOUNTER — Ambulatory Visit (HOSPITAL_BASED_OUTPATIENT_CLINIC_OR_DEPARTMENT_OTHER): Payer: Medicare Other | Admitting: Internal Medicine

## 2015-08-08 ENCOUNTER — Encounter: Payer: Self-pay | Admitting: Internal Medicine

## 2015-08-08 ENCOUNTER — Ambulatory Visit (HOSPITAL_BASED_OUTPATIENT_CLINIC_OR_DEPARTMENT_OTHER): Payer: Medicare Other

## 2015-08-08 VITALS — BP 127/73 | HR 72 | Temp 97.8°F | Resp 18 | Ht 62.0 in | Wt 141.5 lb

## 2015-08-08 DIAGNOSIS — C3491 Malignant neoplasm of unspecified part of right bronchus or lung: Secondary | ICD-10-CM

## 2015-08-08 DIAGNOSIS — Z5111 Encounter for antineoplastic chemotherapy: Secondary | ICD-10-CM | POA: Diagnosis not present

## 2015-08-08 DIAGNOSIS — C3412 Malignant neoplasm of upper lobe, left bronchus or lung: Secondary | ICD-10-CM

## 2015-08-08 DIAGNOSIS — C3492 Malignant neoplasm of unspecified part of left bronchus or lung: Principal | ICD-10-CM

## 2015-08-08 LAB — COMPREHENSIVE METABOLIC PANEL
ALT: 10 U/L (ref 0–55)
ANION GAP: 8 meq/L (ref 3–11)
AST: 21 U/L (ref 5–34)
Albumin: 3.5 g/dL (ref 3.5–5.0)
Alkaline Phosphatase: 88 U/L (ref 40–150)
BUN: 13.9 mg/dL (ref 7.0–26.0)
CALCIUM: 8.8 mg/dL (ref 8.4–10.4)
CHLORIDE: 108 meq/L (ref 98–109)
CO2: 24 mEq/L (ref 22–29)
Creatinine: 0.8 mg/dL (ref 0.6–1.1)
EGFR: 67 mL/min/{1.73_m2} — ABNORMAL LOW (ref >90)
Glucose: 56 mg/dl — ABNORMAL LOW (ref 70–140)
POTASSIUM: 4.1 meq/L (ref 3.5–5.1)
Sodium: 140 mEq/L (ref 136–145)
Total Bilirubin: 0.48 mg/dL (ref 0.20–1.20)
Total Protein: 7.3 g/dL (ref 6.4–8.3)

## 2015-08-08 LAB — CBC WITH DIFFERENTIAL/PLATELET
BASO%: 0.7 % (ref 0.0–2.0)
BASOS ABS: 0 10*3/uL (ref 0.0–0.1)
EOS%: 1.9 % (ref 0.0–7.0)
Eosinophils Absolute: 0.1 10*3/uL (ref 0.0–0.5)
HEMATOCRIT: 38.4 % (ref 34.8–46.6)
HGB: 12.6 g/dL (ref 11.6–15.9)
LYMPH#: 1.6 10*3/uL (ref 0.9–3.3)
LYMPH%: 50.8 % — AB (ref 14.0–49.7)
MCH: 32.9 pg (ref 25.1–34.0)
MCHC: 32.7 g/dL (ref 31.5–36.0)
MCV: 100.7 fL (ref 79.5–101.0)
MONO#: 0.4 10*3/uL (ref 0.1–0.9)
MONO%: 12.8 % (ref 0.0–14.0)
NEUT#: 1.1 10*3/uL — ABNORMAL LOW (ref 1.5–6.5)
NEUT%: 33.8 % — AB (ref 38.4–76.8)
PLATELETS: 244 10*3/uL (ref 145–400)
RBC: 3.82 10*6/uL (ref 3.70–5.45)
RDW: 14.3 % (ref 11.2–14.5)
WBC: 3.1 10*3/uL — ABNORMAL LOW (ref 3.9–10.3)

## 2015-08-08 MED ORDER — HEPARIN SOD (PORK) LOCK FLUSH 100 UNIT/ML IV SOLN
500.0000 [IU] | Freq: Once | INTRAVENOUS | Status: AC | PRN
  Administered 2015-08-08: 500 [IU]
  Filled 2015-08-08: qty 5

## 2015-08-08 MED ORDER — SODIUM CHLORIDE 0.9 % IV SOLN
Freq: Once | INTRAVENOUS | Status: AC
  Administered 2015-08-08: 11:00:00 via INTRAVENOUS

## 2015-08-08 MED ORDER — SODIUM CHLORIDE 0.9 % IJ SOLN
10.0000 mL | INTRAMUSCULAR | Status: DC | PRN
  Administered 2015-08-08: 10 mL
  Filled 2015-08-08: qty 10

## 2015-08-08 MED ORDER — SODIUM CHLORIDE 0.9 % IV SOLN
Freq: Once | INTRAVENOUS | Status: AC
  Administered 2015-08-08: 11:00:00 via INTRAVENOUS
  Filled 2015-08-08: qty 8

## 2015-08-08 MED ORDER — SODIUM CHLORIDE 0.9 % IV SOLN
500.0000 mg/m2 | Freq: Once | INTRAVENOUS | Status: AC
  Administered 2015-08-08: 850 mg via INTRAVENOUS
  Filled 2015-08-08: qty 28

## 2015-08-08 NOTE — Telephone Encounter (Signed)
Gave and printed appt sched and avs for pt for March thru May °

## 2015-08-08 NOTE — Progress Notes (Signed)
[place] Telephone:([phone]   Fax:(336) [place], MD [place] [address]  Principle Diagnosis and stage: Recurrent non-small cell lung cancer, initially diagnosed a stage IB (T2, N0, M0) adenocarcinoma in June 2008 with a tumor size of 8 cm.   Prior Therapy:  #1 status post left upper lobectomy with lymph node dissection under the care of Dr. Arlyce Dice on 06/29/2006.  #2 status post 4 cycles of adjuvant chemotherapy with cisplatin and Taxotere last dose given 08/01/2006.  #3 status post 6 cycles of systemic chemotherapy with carboplatin and Alimta for disease recurrence last dose given 12/12/2009.   Current therapy: Maintenance chemotherapy with Alimta at 500 mg per meter squared given every 3 weeks status post 79 cycles.  CHEMOTHERAPY INTENT: Palliative/maintenance.  CURRENT # OF CHEMOTHERAPY CYCLES: 80  CURRENT ANTIEMETICS: Zofran, dexamethasone and Compazine  CURRENT SMOKING STATUS: Never Smoker.   ORAL CHEMOTHERAPY AND CONSENT: None   CURRENT BISPHOSPHONATES USE: None  PAIN MANAGEMENT: None  NARCOTICS INDUCED CONSTIPATION: None  LIVING WILL AND CODE STATUS: has living will for no CODE BLUE.  INTERVAL HISTORY: [name] 82 y.o. female returns to the clinic today for follow up visit. The patient has no complaints today except for some occasional dizzy episodes when she turns her head fast. She was evaluated in the past by Dr. Lucia Gaskins for inner ear problem. She is tolerating her maintenance chemotherapy with single agent Alimta fairly well. The patient denied having any significant nausea or vomiting. She has no fever or chills. The patient denied having any significant chest pain, shortness of breath, or hemoptysis. She has no weight loss or night sweats. She is here today for evaluation before starting cycle #80 of her maintenance chemotherapy.   MEDICAL HISTORY: Past Medical History   Diagnosis Date  . History of migraine headaches   . Hypercholesterolemia   . lung ca dx'd 06/2006    rt and lt lung  . Lung cancer ([place]) 3/14/1    right- Adenocarcinoma w/bronchioalveolar features  . FHx: chemotherapy 2008&2011    4 cycles cisplatin,taxotere,2008& carboplatin and Alimta 2011    ALLERGIES:  is allergic to simvastatin and iodinated diagnostic agents.  MEDICATIONS:  Current Outpatient Prescriptions  Medication Sig Dispense Refill  . chlorpheniramine-HYDROcodone (TUSSIONEX) 10-8 MG/5ML SUER Take 5 mLs by mouth 2 (two) times daily.  0  . folic acid (FOLVITE) 1 MG tablet Take 1 mg by mouth daily.      . hydrocortisone 2.5 % cream     . Multiple Vitamins-Minerals (CENTRUM SILVER PO) Take 1 tablet by mouth daily.     . potassium gluconate 595 MG TABS Take 595 mg by mouth daily.    . prochlorperazine (COMPAZINE) 10 MG tablet Take 1 tablet (10 mg total) by mouth every 6 (six) hours as needed. 15 tablet 1  . vitamin B-12 (CYANOCOBALAMIN) 500 MCG tablet Take 1,500 mcg by mouth daily.     . Calcium Carbonate-Vit D-Min (CALTRATE PLUS PO) Take 500 mg by mouth 2 (two) times daily.       No current facility-administered medications for this visit.    REVIEW OF SYSTEMS:  A comprehensive review of systems was negative.   PHYSICAL EXAMINATION: General appearance: alert, cooperative and no distress Head: Normocephalic, without obvious abnormality, atraumatic Neck: no adenopathy Lymph nodes: Cervical, supraclavicular, and axillary nodes normal. Resp: clear to auscultation bilaterally Back: symmetric, no curvature. ROM normal. No CVA tenderness.  Cardio: regular rate and rhythm, S1, S2 normal, no murmur, click, rub or gallop GI: soft, non-tender; bowel sounds normal; no masses,  no organomegaly Extremities: extremities normal, atraumatic, no cyanosis or edema Neurologic: Alert and oriented X 3, normal strength and tone. Normal symmetric reflexes. Normal coordination and gait  ECOG  PERFORMANCE STATUS: 0 - Asymptomatic  Blood pressure 127/73, pulse 72, temperature 97.8 F (36.6 C), temperature source Oral, resp. rate 18, height '5\' 2"'$  (1.575 m), weight 141 lb 8 oz (64.184 kg), SpO2 100 %.  LABORATORY DATA: Lab Results  Component Value Date   WBC 3.1* 08/08/2015   HGB 12.6 08/08/2015   HCT 38.4 08/08/2015   MCV 100.7 08/08/2015   PLT 244 08/08/2015      Chemistry      Component Value Date/Time   NA 140 07/18/2015 1357   NA 139 01/24/2012 1008   NA 140 08/21/2011 1306   K 3.9 07/18/2015 1357   K 4.2 01/24/2012 1008   K 4.1 08/21/2011 1306   CL 108* 11/04/2012 0947   CL 103 01/24/2012 1008   CL 107 08/21/2011 1306   CO2 24 07/18/2015 1357   CO2 28 01/24/2012 1008   CO2 22 08/21/2011 1306   BUN 15.3 07/18/2015 1357   BUN 13 01/24/2012 1008   BUN 11 08/21/2011 1306   CREATININE 0.9 07/18/2015 1357   CREATININE 0.8 01/24/2012 1008   CREATININE 0.81 08/21/2011 1306      Component Value Date/Time   CALCIUM 9.0 07/18/2015 1357   CALCIUM 8.6 01/24/2012 1008   CALCIUM 8.8 08/21/2011 1306   ALKPHOS 86 07/18/2015 1357   ALKPHOS 79 01/24/2012 1008   ALKPHOS 87 08/21/2011 1306   AST 20 07/18/2015 1357   AST 24 01/24/2012 1008   AST 17 08/21/2011 1306   ALT 11 07/18/2015 1357   ALT 21 01/24/2012 1008   ALT 8 08/21/2011 1306   BILITOT 0.41 07/18/2015 1357   BILITOT 0.90 01/24/2012 1008   BILITOT 0.6 08/21/2011 1306       RADIOGRAPHIC STUDIES: No results found. ASSESSMENT AND PLAN: This is a very pleasant 82 years old white female with recurrent non-small cell lung cancer, adenocarcinoma. The patient is currently undergoing maintenance chemotherapy with single agent Alimta status post 79 cycles and she is tolerating it fairly well.  I recommended for her to proceed with cycle #80 today as a scheduled. I would see her back for follow-up visit in 3 weeks for evaluation before starting cycle #81. She was advised to call immediately if she has any  concerning symptoms in the interval.  Disclaimer: This note was dictated with voice recognition software. Similar sounding words can inadvertently be transcribed and may not be corrected upon review.  [name]., MD 08/08/2015

## 2015-08-08 NOTE — Patient Instructions (Signed)
[facility] Discharge Instructions for Patients Receiving Chemotherapy  Today you received the following chemotherapy agents Alimta.  To help prevent nausea and vomiting after your treatment, we encourage you to take your nausea medication as prescribed.   If you develop nausea and vomiting that is not controlled by your nausea medication, call the clinic.   BELOW ARE SYMPTOMS THAT SHOULD BE REPORTED IMMEDIATELY:  *FEVER GREATER THAN 100.5 F  *CHILLS WITH OR WITHOUT FEVER  NAUSEA AND VOMITING THAT IS NOT CONTROLLED WITH YOUR NAUSEA MEDICATION  *UNUSUAL SHORTNESS OF BREATH  *UNUSUAL BRUISING OR BLEEDING  TENDERNESS IN MOUTH AND THROAT WITH OR WITHOUT PRESENCE OF ULCERS  *URINARY PROBLEMS  *BOWEL PROBLEMS  UNUSUAL RASH Items with * indicate a potential emergency and should be followed up as soon as possible.  Feel free to call the clinic you have any questions or concerns. The clinic phone number is [phone].  Please show the CHEMO ALERT CARD at check-in to the Emergency Department and triage nurse.   

## 2015-08-08 NOTE — Progress Notes (Signed)
Ok to treat [name] with ANC 1.1 per Dr. Julien Nordmann.

## 2015-08-23 ENCOUNTER — Other Ambulatory Visit: Payer: Medicare Other

## 2015-08-23 ENCOUNTER — Ambulatory Visit: Payer: Medicare Other

## 2015-08-23 ENCOUNTER — Ambulatory Visit: Payer: Medicare Other | Admitting: Internal Medicine

## 2015-08-29 ENCOUNTER — Encounter: Payer: Self-pay | Admitting: Pharmacist

## 2015-08-30 ENCOUNTER — Encounter: Payer: Self-pay | Admitting: Internal Medicine

## 2015-08-30 ENCOUNTER — Ambulatory Visit (HOSPITAL_BASED_OUTPATIENT_CLINIC_OR_DEPARTMENT_OTHER): Payer: Medicare Other

## 2015-08-30 ENCOUNTER — Other Ambulatory Visit (HOSPITAL_BASED_OUTPATIENT_CLINIC_OR_DEPARTMENT_OTHER): Payer: Medicare Other

## 2015-08-30 ENCOUNTER — Ambulatory Visit (HOSPITAL_BASED_OUTPATIENT_CLINIC_OR_DEPARTMENT_OTHER): Payer: Medicare Other | Admitting: Internal Medicine

## 2015-08-30 ENCOUNTER — Telehealth: Payer: Self-pay | Admitting: Internal Medicine

## 2015-08-30 VITALS — BP 105/89 | HR 73 | Temp 97.9°F | Resp 18 | Ht 62.0 in | Wt 140.5 lb

## 2015-08-30 DIAGNOSIS — C3412 Malignant neoplasm of upper lobe, left bronchus or lung: Secondary | ICD-10-CM | POA: Diagnosis not present

## 2015-08-30 DIAGNOSIS — C3491 Malignant neoplasm of unspecified part of right bronchus or lung: Secondary | ICD-10-CM

## 2015-08-30 DIAGNOSIS — Z5111 Encounter for antineoplastic chemotherapy: Secondary | ICD-10-CM

## 2015-08-30 DIAGNOSIS — C3492 Malignant neoplasm of unspecified part of left bronchus or lung: Secondary | ICD-10-CM

## 2015-08-30 LAB — CBC WITH DIFFERENTIAL/PLATELET
BASO%: 0.7 % (ref 0.0–2.0)
BASOS ABS: 0 10*3/uL (ref 0.0–0.1)
EOS ABS: 0.1 10*3/uL (ref 0.0–0.5)
EOS%: 2.5 % (ref 0.0–7.0)
HCT: 36.8 % (ref 34.8–46.6)
HEMOGLOBIN: 12.1 g/dL (ref 11.6–15.9)
LYMPH%: 44.2 % (ref 14.0–49.7)
MCH: 32.9 pg (ref 25.1–34.0)
MCHC: 32.9 g/dL (ref 31.5–36.0)
MCV: 100 fL (ref 79.5–101.0)
MONO#: 0.4 10*3/uL (ref 0.1–0.9)
MONO%: 14.4 % — AB (ref 0.0–14.0)
NEUT#: 1.1 10*3/uL — ABNORMAL LOW (ref 1.5–6.5)
NEUT%: 38.2 % — ABNORMAL LOW (ref 38.4–76.8)
PLATELETS: 227 10*3/uL (ref 145–400)
RBC: 3.68 10*6/uL — ABNORMAL LOW (ref 3.70–5.45)
RDW: 14.5 % (ref 11.2–14.5)
WBC: 2.8 10*3/uL — ABNORMAL LOW (ref 3.9–10.3)
lymph#: 1.2 10*3/uL (ref 0.9–3.3)

## 2015-08-30 LAB — COMPREHENSIVE METABOLIC PANEL
ALBUMIN: 3.2 g/dL — AB (ref 3.5–5.0)
ALK PHOS: 73 U/L (ref 40–150)
ALT: 11 U/L (ref 0–55)
ANION GAP: 8 meq/L (ref 3–11)
AST: 21 U/L (ref 5–34)
BILIRUBIN TOTAL: 0.49 mg/dL (ref 0.20–1.20)
BUN: 14.2 mg/dL (ref 7.0–26.0)
CALCIUM: 8.7 mg/dL (ref 8.4–10.4)
CO2: 24 mEq/L (ref 22–29)
Chloride: 109 mEq/L (ref 98–109)
Creatinine: 0.8 mg/dL (ref 0.6–1.1)
EGFR: 65 mL/min/{1.73_m2} — AB (ref >90)
GLUCOSE: 65 mg/dL — AB (ref 70–140)
POTASSIUM: 4 meq/L (ref 3.5–5.1)
SODIUM: 141 meq/L (ref 136–145)
Total Protein: 6.7 g/dL (ref 6.4–8.3)

## 2015-08-30 MED ORDER — SODIUM CHLORIDE 0.9 % IV SOLN
Freq: Once | INTRAVENOUS | Status: AC
  Administered 2015-08-30: 10:00:00 via INTRAVENOUS

## 2015-08-30 MED ORDER — CYANOCOBALAMIN 1000 MCG/ML IJ SOLN
1000.0000 ug | Freq: Once | INTRAMUSCULAR | Status: AC
  Administered 2015-08-30: 1000 ug via INTRAMUSCULAR

## 2015-08-30 MED ORDER — CYANOCOBALAMIN 1000 MCG/ML IJ SOLN
INTRAMUSCULAR | Status: AC
  Filled 2015-08-30: qty 1

## 2015-08-30 MED ORDER — SODIUM CHLORIDE 0.9 % IV SOLN
500.0000 mg/m2 | Freq: Once | INTRAVENOUS | Status: AC
  Administered 2015-08-30: 850 mg via INTRAVENOUS
  Filled 2015-08-30: qty 28

## 2015-08-30 MED ORDER — HEPARIN SOD (PORK) LOCK FLUSH 100 UNIT/ML IV SOLN
500.0000 [IU] | Freq: Once | INTRAVENOUS | Status: AC | PRN
  Administered 2015-08-30: 500 [IU]
  Filled 2015-08-30: qty 5

## 2015-08-30 MED ORDER — SODIUM CHLORIDE 0.9 % IV SOLN
Freq: Once | INTRAVENOUS | Status: AC
  Administered 2015-08-30: 10:00:00 via INTRAVENOUS
  Filled 2015-08-30: qty 8

## 2015-08-30 MED ORDER — HYDROCOD POLST-CPM POLST ER 10-8 MG/5ML PO SUER: 5.0000 mL | Freq: Two times a day (BID) | ORAL | Status: DC

## 2015-08-30 MED ORDER — SODIUM CHLORIDE 0.9 % IJ SOLN
10.0000 mL | INTRAMUSCULAR | Status: DC | PRN
  Administered 2015-08-30: 10 mL
  Filled 2015-08-30: qty 10

## 2015-08-30 NOTE — Telephone Encounter (Signed)
Gave and printed appt shced and avs for pt for March thru May

## 2015-08-30 NOTE — Progress Notes (Signed)
[place] Telephone:(336) [phone]   Fax:(336) Dunn [place], MD [place] [address]  Principle Diagnosis and stage: Recurrent non-small cell lung cancer, initially diagnosed a stage IB (T2, N0, M0) adenocarcinoma in June 2008 with a tumor size of 8 cm.   Prior Therapy:  #1 status post left upper lobectomy with lymph node dissection under the care of Dr. Arlyce Dice on 06/29/2006.  #2 status post 4 cycles of adjuvant chemotherapy with cisplatin and Taxotere last dose given 08/01/2006.  #3 status post 6 cycles of systemic chemotherapy with carboplatin and Alimta for disease recurrence last dose given 12/12/2009.   Current therapy: Maintenance chemotherapy with Alimta at 500 mg per meter squared given every 3 weeks status post 80 cycles.  CHEMOTHERAPY INTENT: Palliative/maintenance.  CURRENT # OF CHEMOTHERAPY CYCLES: 81  CURRENT ANTIEMETICS: Zofran, dexamethasone and Compazine  CURRENT SMOKING STATUS: Never Smoker.   ORAL CHEMOTHERAPY AND CONSENT: None   CURRENT BISPHOSPHONATES USE: None  PAIN MANAGEMENT: None  NARCOTICS INDUCED CONSTIPATION: None  LIVING WILL AND CODE STATUS: has living will for no CODE BLUE.  INTERVAL HISTORY: [name] 82 y.o. female returns to the clinic today for follow up visit. The patient has no complaints today except for some occasional dizzy episodes when changing head position. She was supposed to see Dr. Lucia Gaskins but she did not schedule an appointment with him. She is tolerating her maintenance chemotherapy with single agent Alimta fairly well. The patient denied having any significant nausea or vomiting. She has no fever or chills. The patient denied having any significant chest pain, shortness of breath, or hemoptysis. She has no weight loss or night sweats. She is here today for evaluation before starting cycle #81 of her maintenance chemotherapy.   MEDICAL  HISTORY: Past Medical History  Diagnosis Date  . History of migraine headaches   . Hypercholesterolemia   . lung ca dx'd 06/2006    rt and lt lung  . Lung cancer ([place]) 3/14/1    right- Adenocarcinoma w/bronchioalveolar features  . FHx: chemotherapy 2008&2011    4 cycles cisplatin,taxotere,2008& carboplatin and Alimta 2011    ALLERGIES:  is allergic to simvastatin and iodinated diagnostic agents.  MEDICATIONS:  Current Outpatient Prescriptions  Medication Sig Dispense Refill  . Calcium Carbonate-Vit D-Min (CALTRATE PLUS PO) Take 500 mg by mouth 2 (two) times daily.      . chlorpheniramine-HYDROcodone (TUSSIONEX) 10-8 MG/5ML SUER Take 5 mLs by mouth 2 (two) times daily.  0  . folic acid (FOLVITE) 1 MG tablet Take 1 mg by mouth daily.      . hydrocortisone 2.5 % cream     . Multiple Vitamins-Minerals (CENTRUM SILVER PO) Take 1 tablet by mouth daily.     . potassium gluconate 595 MG TABS Take 595 mg by mouth daily.    . vitamin B-12 (CYANOCOBALAMIN) 500 MCG tablet Take 1,500 mcg by mouth daily.     . prochlorperazine (COMPAZINE) 10 MG tablet Take 1 tablet (10 mg total) by mouth every 6 (six) hours as needed. (Patient not taking: Reported on 08/30/2015) 15 tablet 1   No current facility-administered medications for this visit.    REVIEW OF SYSTEMS:  A comprehensive review of systems was negative except for: Neurological: positive for vertigo   PHYSICAL EXAMINATION: General appearance: alert, cooperative and no distress Head: Normocephalic, without obvious abnormality, atraumatic Neck: no adenopathy Lymph nodes: Cervical, supraclavicular, and axillary nodes normal. Resp:  clear to auscultation bilaterally Back: symmetric, no curvature. ROM normal. No CVA tenderness. Cardio: regular rate and rhythm, S1, S2 normal, no murmur, click, rub or gallop GI: soft, non-tender; bowel sounds normal; no masses,  no organomegaly Extremities: extremities normal, atraumatic, no cyanosis or  edema Neurologic: Alert and oriented X 3, normal strength and tone. Normal symmetric reflexes. Normal coordination and gait  ECOG PERFORMANCE STATUS: 0 - Asymptomatic  Blood pressure 105/89, pulse 73, temperature 97.9 F (36.6 C), temperature source Oral, resp. rate 18, height '5\' 2"'$  (1.575 m), weight 140 lb 8 oz (63.73 kg), SpO2 100 %.  LABORATORY DATA: Lab Results  Component Value Date   WBC 2.8* 08/30/2015   HGB 12.1 08/30/2015   HCT 36.8 08/30/2015   MCV 100.0 08/30/2015   PLT 227 08/30/2015      Chemistry      Component Value Date/Time   NA 140 08/08/2015 0920   NA 139 01/24/2012 1008   NA 140 08/21/2011 1306   K 4.1 08/08/2015 0920   K 4.2 01/24/2012 1008   K 4.1 08/21/2011 1306   CL 108* 11/04/2012 0947   CL 103 01/24/2012 1008   CL 107 08/21/2011 1306   CO2 24 08/08/2015 0920   CO2 28 01/24/2012 1008   CO2 22 08/21/2011 1306   BUN 13.9 08/08/2015 0920   BUN 13 01/24/2012 1008   BUN 11 08/21/2011 1306   CREATININE 0.8 08/08/2015 0920   CREATININE 0.8 01/24/2012 1008   CREATININE 0.81 08/21/2011 1306      Component Value Date/Time   CALCIUM 8.8 08/08/2015 0920   CALCIUM 8.6 01/24/2012 1008   CALCIUM 8.8 08/21/2011 1306   ALKPHOS 88 08/08/2015 0920   ALKPHOS 79 01/24/2012 1008   ALKPHOS 87 08/21/2011 1306   AST 21 08/08/2015 0920   AST 24 01/24/2012 1008   AST 17 08/21/2011 1306   ALT 10 08/08/2015 0920   ALT 21 01/24/2012 1008   ALT 8 08/21/2011 1306   BILITOT 0.48 08/08/2015 0920   BILITOT 0.90 01/24/2012 1008   BILITOT 0.6 08/21/2011 1306       RADIOGRAPHIC STUDIES: No results found. ASSESSMENT AND PLAN: This is a very pleasant 82 years old white female with recurrent non-small cell lung cancer, adenocarcinoma. The patient is currently undergoing maintenance chemotherapy with single agent Alimta status post 80 cycles and she is tolerating it fairly well.  I recommended for her to proceed with cycle #81 today as a scheduled. I would see her back  for follow-up visit in 3 weeks for evaluation before starting cycle #82 after repeating CT scan of the chest for restaging of her disease. She was advised to call immediately if she has any concerning symptoms in the interval.  Disclaimer: This note was dictated with voice recognition software. Similar sounding words can inadvertently be transcribed and may not be corrected upon review.  [name]., MD 08/30/2015

## 2015-08-30 NOTE — Progress Notes (Signed)
Patient's ANC today is 1.1. Contacted Dr. Mckinley Jewel and he gave the ok to treat.  Ma Hillock, RN

## 2015-08-30 NOTE — Patient Instructions (Signed)
[facility] Discharge Instructions for Patients Receiving Chemotherapy  Today you received the following chemotherapy agents Alimta.  To help prevent nausea and vomiting after your treatment, we encourage you to take your nausea medication as prescribed.   If you develop nausea and vomiting that is not controlled by your nausea medication, call the clinic.   BELOW ARE SYMPTOMS THAT SHOULD BE REPORTED IMMEDIATELY:  *FEVER GREATER THAN 100.5 F  *CHILLS WITH OR WITHOUT FEVER  NAUSEA AND VOMITING THAT IS NOT CONTROLLED WITH YOUR NAUSEA MEDICATION  *UNUSUAL SHORTNESS OF BREATH  *UNUSUAL BRUISING OR BLEEDING  TENDERNESS IN MOUTH AND THROAT WITH OR WITHOUT PRESENCE OF ULCERS  *URINARY PROBLEMS  *BOWEL PROBLEMS  UNUSUAL RASH Items with * indicate a potential emergency and should be followed up as soon as possible.  Feel free to call the clinic you have any questions or concerns. The clinic phone number is [phone].  Please show the CHEMO ALERT CARD at check-in to the Emergency Department and triage nurse.   

## 2015-09-19 ENCOUNTER — Ambulatory Visit (HOSPITAL_COMMUNITY)
Admission: RE | Admit: 2015-09-19 | Discharge: 2015-09-19 | Disposition: A | Discharge status: Home / Self Care | Payer: Medicare Other | Source: Ambulatory Visit | Attending: Internal Medicine | Admitting: Internal Medicine

## 2015-09-19 DIAGNOSIS — I2510 Atherosclerotic heart disease of native coronary artery without angina pectoris: Secondary | ICD-10-CM | POA: Insufficient documentation

## 2015-09-19 DIAGNOSIS — Z5111 Encounter for antineoplastic chemotherapy: Secondary | ICD-10-CM

## 2015-09-19 DIAGNOSIS — C3492 Malignant neoplasm of unspecified part of left bronchus or lung: Secondary | ICD-10-CM | POA: Diagnosis present

## 2015-09-19 DIAGNOSIS — I7090 Unspecified atherosclerosis: Secondary | ICD-10-CM | POA: Diagnosis not present

## 2015-09-19 DIAGNOSIS — C3491 Malignant neoplasm of unspecified part of right bronchus or lung: Secondary | ICD-10-CM

## 2015-09-20 ENCOUNTER — Ambulatory Visit (HOSPITAL_BASED_OUTPATIENT_CLINIC_OR_DEPARTMENT_OTHER): Payer: Medicare Other

## 2015-09-20 ENCOUNTER — Other Ambulatory Visit (HOSPITAL_BASED_OUTPATIENT_CLINIC_OR_DEPARTMENT_OTHER): Payer: Medicare Other

## 2015-09-20 ENCOUNTER — Ambulatory Visit (HOSPITAL_BASED_OUTPATIENT_CLINIC_OR_DEPARTMENT_OTHER): Payer: Medicare Other | Admitting: Internal Medicine

## 2015-09-20 ENCOUNTER — Telehealth: Payer: Self-pay | Admitting: Internal Medicine

## 2015-09-20 ENCOUNTER — Encounter: Payer: Self-pay | Admitting: Internal Medicine

## 2015-09-20 VITALS — BP 140/67 | HR 64 | Temp 97.6°F | Resp 18 | Ht 62.0 in | Wt 140.2 lb

## 2015-09-20 DIAGNOSIS — C3412 Malignant neoplasm of upper lobe, left bronchus or lung: Secondary | ICD-10-CM

## 2015-09-20 DIAGNOSIS — Z5111 Encounter for antineoplastic chemotherapy: Secondary | ICD-10-CM | POA: Diagnosis not present

## 2015-09-20 DIAGNOSIS — C3492 Malignant neoplasm of unspecified part of left bronchus or lung: Principal | ICD-10-CM

## 2015-09-20 DIAGNOSIS — C3491 Malignant neoplasm of unspecified part of right bronchus or lung: Secondary | ICD-10-CM

## 2015-09-20 LAB — CBC WITH DIFFERENTIAL/PLATELET
BASO%: 0.4 % (ref 0.0–2.0)
Basophils Absolute: 0 10*3/uL (ref 0.0–0.1)
EOS ABS: 0.1 10*3/uL (ref 0.0–0.5)
EOS%: 2.2 % (ref 0.0–7.0)
HEMATOCRIT: 37.2 % (ref 34.8–46.6)
HEMOGLOBIN: 12.5 g/dL (ref 11.6–15.9)
LYMPH#: 1.1 10*3/uL (ref 0.9–3.3)
LYMPH%: 38.8 % (ref 14.0–49.7)
MCH: 33.4 pg (ref 25.1–34.0)
MCHC: 33.6 g/dL (ref 31.5–36.0)
MCV: 99.5 fL (ref 79.5–101.0)
MONO#: 0.4 10*3/uL (ref 0.1–0.9)
MONO%: 13.7 % (ref 0.0–14.0)
NEUT%: 44.9 % (ref 38.4–76.8)
NEUTROS ABS: 1.3 10*3/uL — AB (ref 1.5–6.5)
PLATELETS: 249 10*3/uL (ref 145–400)
RBC: 3.74 10*6/uL (ref 3.70–5.45)
RDW: 14.6 % — AB (ref 11.2–14.5)
WBC: 2.8 10*3/uL — AB (ref 3.9–10.3)

## 2015-09-20 LAB — COMPREHENSIVE METABOLIC PANEL
ALBUMIN: 3.3 g/dL — AB (ref 3.5–5.0)
ALK PHOS: 76 U/L (ref 40–150)
ALT: 9 U/L (ref 0–55)
AST: 23 U/L (ref 5–34)
Anion Gap: 8 mEq/L (ref 3–11)
BILIRUBIN TOTAL: 0.54 mg/dL (ref 0.20–1.20)
BUN: 15.6 mg/dL (ref 7.0–26.0)
CO2: 25 mEq/L (ref 22–29)
Calcium: 9.1 mg/dL (ref 8.4–10.4)
Chloride: 108 mEq/L (ref 98–109)
Creatinine: 0.8 mg/dL (ref 0.6–1.1)
EGFR: 65 mL/min/{1.73_m2} — ABNORMAL LOW (ref >90)
GLUCOSE: 71 mg/dL (ref 70–140)
Potassium: 3.8 mEq/L (ref 3.5–5.1)
SODIUM: 141 meq/L (ref 136–145)
TOTAL PROTEIN: 7.1 g/dL (ref 6.4–8.3)

## 2015-09-20 MED ORDER — SODIUM CHLORIDE 0.9 % IV SOLN
500.0000 mg/m2 | Freq: Once | INTRAVENOUS | Status: AC
  Administered 2015-09-20: 850 mg via INTRAVENOUS
  Filled 2015-09-20: qty 28

## 2015-09-20 MED ORDER — SODIUM CHLORIDE 0.9 % IJ SOLN
10.0000 mL | INTRAMUSCULAR | Status: DC | PRN
  Administered 2015-09-20: 10 mL
  Filled 2015-09-20: qty 10

## 2015-09-20 MED ORDER — SODIUM CHLORIDE 0.9 % IV SOLN
Freq: Once | INTRAVENOUS | Status: AC
  Administered 2015-09-20: 10:00:00 via INTRAVENOUS
  Filled 2015-09-20: qty 8

## 2015-09-20 MED ORDER — SODIUM CHLORIDE 0.9 % IV SOLN
Freq: Once | INTRAVENOUS | Status: AC
  Administered 2015-09-20: 10:00:00 via INTRAVENOUS

## 2015-09-20 MED ORDER — HEPARIN SOD (PORK) LOCK FLUSH 100 UNIT/ML IV SOLN
500.0000 [IU] | Freq: Once | INTRAVENOUS | Status: AC | PRN
  Administered 2015-09-20: 500 [IU]
  Filled 2015-09-20: qty 5

## 2015-09-20 NOTE — Patient Instructions (Signed)
[facility] Discharge Instructions for Patients Receiving Chemotherapy  Today you received the following chemotherapy agents alimta  To help prevent nausea and vomiting after your treatment, we encourage you to take your nausea medication as directed  If you develop nausea and vomiting that is not controlled by your nausea medication, call the clinic.   BELOW ARE SYMPTOMS THAT SHOULD BE REPORTED IMMEDIATELY:  *FEVER GREATER THAN 100.5 F  *CHILLS WITH OR WITHOUT FEVER  NAUSEA AND VOMITING THAT IS NOT CONTROLLED WITH YOUR NAUSEA MEDICATION  *UNUSUAL SHORTNESS OF BREATH  *UNUSUAL BRUISING OR BLEEDING  TENDERNESS IN MOUTH AND THROAT WITH OR WITHOUT PRESENCE OF ULCERS  *URINARY PROBLEMS  *BOWEL PROBLEMS  UNUSUAL RASH Items with * indicate a potential emergency and should be followed up as soon as possible.  Feel free to call the clinic you have any questions or concerns. The clinic phone number is [phone].  

## 2015-09-20 NOTE — Progress Notes (Signed)
[facility] [phone]   Fax:(336) [place], [place] [address]  Principle Diagnosis and stage: Recurrent non-small cell lung cancer, initially diagnosed a stage IB (T2, N0, M0) adenocarcinoma in June 2008 with a tumor size of 8 cm.   Prior Therapy:  #1 status post left upper lobectomy with lymph node dissection under the care of Dr. Arlyce Dice on 06/29/2006.  #2 status post 4 cycles of adjuvant chemotherapy with cisplatin and Taxotere last dose given 08/01/2006.  #3 status post 6 cycles of systemic chemotherapy with carboplatin and Alimta for disease recurrence last dose given 12/12/2009.   Current therapy: Maintenance chemotherapy with Alimta at 500 mg per meter squared given every 3 weeks status post 81 cycles.  CHEMOTHERAPY INTENT: Palliative/maintenance.  CURRENT # OF CHEMOTHERAPY CYCLES: 82  CURRENT ANTIEMETICS: Zofran, dexamethasone and Compazine  CURRENT SMOKING STATUS: Never Smoker.   ORAL CHEMOTHERAPY AND CONSENT: None   CURRENT BISPHOSPHONATES USE: None  PAIN MANAGEMENT: None  NARCOTICS INDUCED CONSTIPATION: None  LIVING WILL AND CODE STATUS: has living will for no CODE BLUE.  INTERVAL HISTORY: [name] 82 y.o. female returns to the clinic today for follow up visit. The patient has no complaints today except for mild shortness breath, cough. She is tolerating her maintenance chemotherapy with single agent Alimta fairly well. The patient denied having any significant nausea or vomiting. She has no fever or chills. The patient denied having any significant chest pain, shortness of breath, or hemoptysis. She has no weight loss or night sweats. She had repeat CT scan of the chest performed recently and she is here for evaluation and discussion of her scan results.   MEDICAL HISTORY: Past Medical History  Diagnosis Date  . History of migraine headaches   .  Hypercholesterolemia   . lung ca dx'd 06/2006    rt and lt lung  . Lung cancer ([facility]) 3/14/1    right- Adenocarcinoma w/bronchioalveolar features  . FHx: chemotherapy 2008&2011    4 cycles cisplatin,taxotere,2008& carboplatin and Alimta 2011    ALLERGIES:  is allergic to simvastatin and iodinated diagnostic agents.  MEDICATIONS:  Current Outpatient Prescriptions  Medication Sig Dispense Refill  . Calcium Carbonate-Vit D-Min (CALTRATE PLUS PO) Take 500 mg by mouth 2 (two) times daily.      . chlorpheniramine-HYDROcodone (TUSSIONEX) 10-8 MG/5ML SUER Take 5 mLs by mouth 2 (two) times daily. 496 mL 0  . folic acid (FOLVITE) 1 MG tablet Take 1 mg by mouth daily.      . hydrocortisone 2.5 % cream     . Multiple Vitamins-Minerals (CENTRUM SILVER PO) Take 1 tablet by mouth daily.     . potassium gluconate 595 MG TABS Take 595 mg by mouth daily.    . prochlorperazine (COMPAZINE) 10 MG tablet Take 1 tablet (10 mg total) by mouth every 6 (six) hours as needed. 15 tablet 1  . vitamin B-12 (CYANOCOBALAMIN) 500 MCG tablet Take 1,500 mcg by mouth daily.      No current facility-administered medications for this visit.    REVIEW OF SYSTEMS:  Constitutional: negative Eyes: negative Ears, nose, mouth, throat, and face: negative Respiratory: positive for cough and dyspnea on exertion Cardiovascular: negative Gastrointestinal: negative Genitourinary:negative Integument/breast: negative Hematologic/lymphatic: negative Musculoskeletal:negative Neurological: negative Behavioral/Psych: negative Endocrine: negative Allergic/Immunologic: negative   PHYSICAL EXAMINATION: General appearance: alert, cooperative and no distress Head: Normocephalic, without obvious abnormality, atraumatic Neck: no adenopathy Lymph nodes:  Cervical, supraclavicular, and axillary nodes normal. Resp: clear to auscultation bilaterally Back: symmetric, no curvature. ROM normal. No CVA tenderness. Cardio: regular rate and  rhythm, S1, S2 normal, no murmur, click, rub or gallop GI: soft, non-tender; bowel sounds normal; no masses,  no organomegaly Extremities: extremities normal, atraumatic, no cyanosis or edema Neurologic: Alert and oriented X 3, normal strength and tone. Normal symmetric reflexes. Normal coordination and gait  ECOG PERFORMANCE STATUS: 0 - Asymptomatic  Blood pressure 140/67, pulse 64, temperature 97.6 F (36.4 C), temperature source Oral, resp. rate 18, height '5\' 2"'$  (1.575 m), weight 140 lb 3.2 oz (63.594 kg), SpO2 100 %.  LABORATORY DATA: Lab Results  Component Value Date   WBC 2.8* 09/20/2015   HGB 12.5 09/20/2015   HCT 37.2 09/20/2015   MCV 99.5 09/20/2015   PLT 249 09/20/2015      Chemistry      Component Value Date/Time   NA 141 08/30/2015 0846   NA 139 01/24/2012 1008   NA 140 08/21/2011 1306   K 4.0 08/30/2015 0846   K 4.2 01/24/2012 1008   K 4.1 08/21/2011 1306   CL 108* 11/04/2012 0947   CL 103 01/24/2012 1008   CL 107 08/21/2011 1306   CO2 24 08/30/2015 0846   CO2 28 01/24/2012 1008   CO2 22 08/21/2011 1306   BUN 14.2 08/30/2015 0846   BUN 13 01/24/2012 1008   BUN 11 08/21/2011 1306   CREATININE 0.8 08/30/2015 0846   CREATININE 0.8 01/24/2012 1008   CREATININE 0.81 08/21/2011 1306      Component Value Date/Time   CALCIUM 8.7 08/30/2015 0846   CALCIUM 8.6 01/24/2012 1008   CALCIUM 8.8 08/21/2011 1306   ALKPHOS 73 08/30/2015 0846   ALKPHOS 79 01/24/2012 1008   ALKPHOS 87 08/21/2011 1306   AST 21 08/30/2015 0846   AST 24 01/24/2012 1008   AST 17 08/21/2011 1306   ALT 11 08/30/2015 0846   ALT 21 01/24/2012 1008   ALT 8 08/21/2011 1306   BILITOT 0.49 08/30/2015 0846   BILITOT 0.90 01/24/2012 1008   BILITOT 0.6 08/21/2011 1306       RADIOGRAPHIC STUDIES: Ct Chest Wo Contrast  09/19/2015  CLINICAL DATA:  Lung cancer diagnosed 06/2006. Chemotherapy in progress. Restaging. In contour for anti neoplastic chemotherapy. EXAM: CT CHEST WITHOUT CONTRAST  TECHNIQUE: Multidetector CT imaging of the chest was performed following the standard protocol without IV contrast. COMPARISON:  05/30/2015 FINDINGS: Mediastinum/Nodes: No supraclavicular adenopathy. A right-sided Port-A-Cath terminates at the cavoatrial junction. Aortic atherosclerosis. Normal heart size, without pericardial effusion. LAD coronary artery atherosclerosis. No mediastinal or definite hilar adenopathy, given limitations of unenhanced CT. Esophageal fluid level on image 44/series 2. Lungs/Pleura: Mild pleural thickening, adjacent to the dominant right upper lobe lung mass. Mild biapical pleural-parenchymal scarring. Right upper lobe lung mass measures on the order of 6.0 x 4.5 cm on image 43/series 5. Compare 4.5 x 3.4 cm on 05/30/2015. On sagittal image 49 measures 3.5 x 9.7 cm today versus 2.9 by 9.8 cm on the prior. 2.7 cm on coronal image 43 today versus similar. A 4 mm left upper lobe pulmonary nodule on image 49/series 5 is similar. Lingular and right middle lobe nodular foci. Example right middle lobe 1.4 cm on image 83/ series 5, similar. Lingular consolidation at 1.3 cm on image 75/ series 5, similar. Upper abdomen: Normal imaged portions of the liver, spleen, stomach, pancreas, adrenal glands. Mild renal cortical thinning bilaterally. Suspect an interpolar right renal  lesion which is too small to characterize at approximately 10 mm. Present back in 2015. Musculoskeletal: Mild osteopenia. Accentuation of expected thoracic kyphosis. Mild superior endplate irregularity at multiple lower thoracic levels, similar. Schmorl's node deformities favored over minimal compression deformity. IMPRESSION: 1. Similar to minimal enlargement of right upper lobe lung mass. 2. No thoracic adenopathy. 3. Areas of right middle lobe and lingular nodular consolidation are similar. 4.  Atherosclerosis, including within the coronary arteries. 5. Esophageal air fluid level suggests dysmotility or gastroesophageal  reflux. Electronically Signed   By: Abigail Miyamoto M.D.   On: 09/19/2015 13:46   ASSESSMENT AND PLAN: This is a very pleasant 82 years old white female with recurrent non-small cell lung cancer, adenocarcinoma. The patient is currently undergoing maintenance chemotherapy with single agent Alimta status post 81 cycles and she is tolerating it fairly well.  The recent CT scan of the chest showed minimal enlargement of the right upper lobe lung mass. I discussed the scan results with the patient today. I recommended for her to continue her treatment with maintenance Alimta. In the meantime I referred the patient back to Dr. Pablo Ledger for consideration of palliative radiotherapy to the enlarging right upper lobe lung mass. She will proceed with cycle #82 today as a scheduled. I would see her back for follow-up visit in 3 weeks for evaluation before starting cycle #83. She was advised to call immediately if she has any concerning symptoms in the interval.  Disclaimer: This note was dictated with voice recognition software. Similar sounding words can inadvertently be transcribed and may not be corrected upon review.  [name]., MD 09/20/2015

## 2015-09-20 NOTE — Progress Notes (Signed)
Neutropenia addressed in office note, per Dr. Julien Nordmann ok to treat today.

## 2015-09-20 NOTE — Telephone Encounter (Signed)
Gave and printed appt sched and avs for pt for may and June

## 2015-09-22 NOTE — Progress Notes (Signed)
Thoracic Location of Tumor / Histology: Recurrent non-small cell right lung cancer.  Patient presented months ago with symptoms [id number] showed enlargement of the right upper lobe lung mass.   Biopsies of  (if applicable) revealed: 6-43-83 LUNG, NEEDLE/CORE BIOPSY(IES), RIGHT : - ADENOCARCINOMA WITH BRONCHIOALVEOLAR FEATURES.  Tobacco/Marijuana/Snuff/ETOH use: Never a smoker  Past/Anticipated interventions by cardiothoracic surgery, if any: None  Past/Anticipated interventions by medical oncology, if [id number] maintenance Alimta q 3 weeks 09-20-15 last dose  Signs/Symptoms Weight changes, if any:  Wt Readings from Last 3 Encounters:  09/28/15 140 lb 14.4 oz (63.912 kg)  09/20/15 140 lb 3.2 oz (63.594 kg)  08/30/15 140 lb 8 oz (63.73 kg)    Respiratory complaints, if any: SOB with exertion,coughing non productive  Hemoptysis, if any:No   Pain issues, if any: None   SAFETY ISSUES:  Prior radiation? :No cancelled for 2015  Pacemaker/ICD?: No  Possible current pregnancy?:No  Is the patient on methotrexate? :No  Current Complaints / other details:  Palliative/Maintenance chemotherapy has a port-a-cath Here to discuss radiation treatment

## 2015-09-28 ENCOUNTER — Ambulatory Visit
Admission: RE | Admit: 2015-09-28 | Discharge: 2015-09-28 | Disposition: A | Discharge status: Home / Self Care | Payer: Medicare Other | Source: Ambulatory Visit | Attending: Radiation Oncology | Admitting: Radiation Oncology

## 2015-09-28 ENCOUNTER — Encounter: Payer: Self-pay | Admitting: Radiation Oncology

## 2015-09-28 VITALS — BP 140/74 | HR 77 | Temp 97.7°F | Resp 18 | Ht 62.0 in | Wt 140.9 lb

## 2015-09-28 DIAGNOSIS — C3492 Malignant neoplasm of unspecified part of left bronchus or lung: Secondary | ICD-10-CM | POA: Diagnosis present

## 2015-09-28 DIAGNOSIS — C3491 Malignant neoplasm of unspecified part of right bronchus or lung: Secondary | ICD-10-CM | POA: Insufficient documentation

## 2015-09-28 DIAGNOSIS — Z510 Encounter for antineoplastic radiation therapy: Secondary | ICD-10-CM | POA: Diagnosis present

## 2015-09-28 DIAGNOSIS — C3411 Malignant neoplasm of upper lobe, right bronchus or lung: Secondary | ICD-10-CM

## 2015-09-28 NOTE — Progress Notes (Signed)
.   Radiation Oncology         [phone]) [phone] ________________________________  Outpatient Reconsultation - Date: 09/28/2015   Name: [name] MRN: [id number]   DOB: January 18, 1933  REFERRING PHYSICIAN: Curt Bears, MD  DIAGNOSIS AND STAGE: Bilateral lung cancer ([facility])   Staging form: Lung, AJCC 7th Edition     Clinical: Stage IB (T2a, N0, M0, Free text: Recurrent non-small cell lung cancer initially diagnosed as stage Ib) - Signed by Curt Bears, MD on 11/17/2013  HISTORY OF PRESENT ILLNESS::[name] is a 82 y.o. female with recurrent non-small cell lung cancer, adenocarcinoma. The patient is currently undergoing maintenance chemotherapy with single agent Alimta status post 81 cycles and she is tolerating it fairly well.   On 09/19/15 chest CT, there is enlargement of a right upper lobe lung mass. Since her scan she denies significant chest pain, shortness of breath or hemoptysis. Dr. Julien Nordmann has referred her to me today for consideration of palliative radiation to her right upper lung mass.  PREVIOUS RADIATION THERAPY: No, canceled in 2015  Past medical, social and family history were reviewed in the electronic chart. Review of symptoms was reviewed in the electronic chart. Medications were reviewed in the electronic chart.   PHYSICAL EXAM:  Filed Vitals:   09/28/15 0827  BP: 140/74  Pulse: 77  Temp: 97.7 F (36.5 C)  Resp: 18  .140 lb 14.4 oz (63.912 kg).   -Patient is alert and oriented, no distress.   IMPRESSION: [name] is a 82 y.o. Female who is diagnosed with recurrent non-small cell lung cancer of the right upper lung.   PLAN:.We discussed the patient's CT scan. We discussed continued surveillance versus proceeding with radiation treatment. We discussed the possible side effects and risks of treatment in addition to the possible benefits of treatment. We discussed the protocol for radiation treatment.  All of the patient's questions were answered. The  patient has signed informed consent.  CT simulation is scheduled for May 23rd at 9 am.   Patient plans to vacation to the beach May 26th.   I spent 30 minutes  face to face with the patient and more than 50% of that time was spent in counseling and/or coordination of care.   ------------------------------------------------  Thea Silversmith, MD  This document serves as a record of services personally performed by Thea Silversmith, MD. It was created on her behalf by Derek Mound, a trained medical scribe. The creation of this record is based on the scribe's personal observations and the provider's statements to them. This document has been checked and approved by the attending provider.

## 2015-09-28 NOTE — Addendum Note (Signed)
Encounter addended by: Malena Edman, RN on: 09/28/2015 11:39 AM<BR>     Documentation filed: Charges VN

## 2015-10-11 ENCOUNTER — Other Ambulatory Visit (HOSPITAL_BASED_OUTPATIENT_CLINIC_OR_DEPARTMENT_OTHER): Payer: Medicare Other

## 2015-10-11 ENCOUNTER — Telehealth: Payer: Self-pay | Admitting: Internal Medicine

## 2015-10-11 ENCOUNTER — Ambulatory Visit (HOSPITAL_BASED_OUTPATIENT_CLINIC_OR_DEPARTMENT_OTHER): Payer: Medicare Other | Admitting: Internal Medicine

## 2015-10-11 ENCOUNTER — Encounter: Payer: Self-pay | Admitting: Internal Medicine

## 2015-10-11 ENCOUNTER — Ambulatory Visit (HOSPITAL_BASED_OUTPATIENT_CLINIC_OR_DEPARTMENT_OTHER): Payer: Medicare Other

## 2015-10-11 VITALS — BP 131/73 | HR 72 | Temp 98.0°F | Resp 18 | Ht 62.0 in | Wt 141.9 lb

## 2015-10-11 DIAGNOSIS — C3492 Malignant neoplasm of unspecified part of left bronchus or lung: Principal | ICD-10-CM

## 2015-10-11 DIAGNOSIS — C3491 Malignant neoplasm of unspecified part of right bronchus or lung: Secondary | ICD-10-CM

## 2015-10-11 DIAGNOSIS — Z5111 Encounter for antineoplastic chemotherapy: Secondary | ICD-10-CM

## 2015-10-11 DIAGNOSIS — C3412 Malignant neoplasm of upper lobe, left bronchus or lung: Secondary | ICD-10-CM

## 2015-10-11 LAB — COMPREHENSIVE METABOLIC PANEL
ALBUMIN: 3.3 g/dL — AB (ref 3.5–5.0)
ALT: 10 U/L (ref 0–55)
AST: 21 U/L (ref 5–34)
Alkaline Phosphatase: 85 U/L (ref 40–150)
Anion Gap: 8 mEq/L (ref 3–11)
BILIRUBIN TOTAL: 0.63 mg/dL (ref 0.20–1.20)
BUN: 10.9 mg/dL (ref 7.0–26.0)
CO2: 25 meq/L (ref 22–29)
CREATININE: 0.8 mg/dL (ref 0.6–1.1)
Calcium: 9.1 mg/dL (ref 8.4–10.4)
Chloride: 108 mEq/L (ref 98–109)
EGFR: 68 mL/min/{1.73_m2} — AB (ref >90)
GLUCOSE: 61 mg/dL — AB (ref 70–140)
Potassium: 3.7 mEq/L (ref 3.5–5.1)
SODIUM: 141 meq/L (ref 136–145)
Total Protein: 7 g/dL (ref 6.4–8.3)

## 2015-10-11 LAB — CBC WITH DIFFERENTIAL/PLATELET
BASO%: 0.6 % (ref 0.0–2.0)
BASOS ABS: 0 10*3/uL (ref 0.0–0.1)
EOS ABS: 0.1 10*3/uL (ref 0.0–0.5)
EOS%: 1.9 % (ref 0.0–7.0)
HEMATOCRIT: 36.4 % (ref 34.8–46.6)
HEMOGLOBIN: 12.1 g/dL (ref 11.6–15.9)
LYMPH%: 52.8 % — ABNORMAL HIGH (ref 14.0–49.7)
MCH: 33.2 pg (ref 25.1–34.0)
MCHC: 33.2 g/dL (ref 31.5–36.0)
MCV: 99.7 fL (ref 79.5–101.0)
MONO#: 0.4 10*3/uL (ref 0.1–0.9)
MONO%: 13 % (ref 0.0–14.0)
NEUT#: 1 10*3/uL — ABNORMAL LOW (ref 1.5–6.5)
NEUT%: 31.7 % — AB (ref 38.4–76.8)
PLATELETS: 246 10*3/uL (ref 145–400)
RBC: 3.65 10*6/uL — ABNORMAL LOW (ref 3.70–5.45)
RDW: 14.9 % — ABNORMAL HIGH (ref 11.2–14.5)
WBC: 3.2 10*3/uL — AB (ref 3.9–10.3)
lymph#: 1.7 10*3/uL (ref 0.9–3.3)

## 2015-10-11 MED ORDER — SODIUM CHLORIDE 0.9 % IV SOLN
Freq: Once | INTRAVENOUS | Status: AC
  Administered 2015-10-11: 10:00:00 via INTRAVENOUS

## 2015-10-11 MED ORDER — HEPARIN SOD (PORK) LOCK FLUSH 100 UNIT/ML IV SOLN
500.0000 [IU] | Freq: Once | INTRAVENOUS | Status: AC | PRN
  Administered 2015-10-11: 500 [IU]
  Filled 2015-10-11: qty 5

## 2015-10-11 MED ORDER — SODIUM CHLORIDE 0.9 % IV SOLN
500.0000 mg/m2 | Freq: Once | INTRAVENOUS | Status: AC
  Administered 2015-10-11: 850 mg via INTRAVENOUS
  Filled 2015-10-11: qty 28

## 2015-10-11 MED ORDER — DEXAMETHASONE SODIUM PHOSPHATE 100 MG/10ML IJ SOLN
Freq: Once | INTRAMUSCULAR | Status: AC
  Administered 2015-10-11: 10:00:00 via INTRAVENOUS
  Filled 2015-10-11: qty 8

## 2015-10-11 MED ORDER — SODIUM CHLORIDE 0.9 % IJ SOLN
10.0000 mL | INTRAMUSCULAR | Status: DC | PRN
  Administered 2015-10-11: 10 mL
  Filled 2015-10-11: qty 10

## 2015-10-11 NOTE — Progress Notes (Signed)
[facility] Telephone:(336) [phone]   Fax:(336) [place], MD [place] [address]  Principle Diagnosis and stage: Recurrent non-small cell lung cancer, initially diagnosed a stage IB (T2, N0, M0) adenocarcinoma in June 2008 with a tumor size of 8 cm.   Prior Therapy:  #1 status post left upper lobectomy with lymph node dissection under the care of Dr. Arlyce Dice on 06/29/2006.  #2 status post 4 cycles of adjuvant chemotherapy with cisplatin and Taxotere last dose given 08/01/2006.  #3 status post 6 cycles of systemic chemotherapy with carboplatin and Alimta for disease recurrence last dose given 12/12/2009.   Current therapy: Maintenance chemotherapy with Alimta at 500 mg per meter squared given every 3 weeks status post 82 cycles.  CHEMOTHERAPY INTENT: Palliative/maintenance.  CURRENT # OF CHEMOTHERAPY CYCLES: 83  CURRENT ANTIEMETICS: Zofran, dexamethasone and Compazine  CURRENT SMOKING STATUS: Never Smoker.   ORAL CHEMOTHERAPY AND CONSENT: None   CURRENT BISPHOSPHONATES USE: None  PAIN MANAGEMENT: None  NARCOTICS INDUCED CONSTIPATION: None  LIVING WILL AND CODE STATUS: has living will for no CODE BLUE.  INTERVAL HISTORY: [name] 82 y.o. female returns to the clinic today for follow up visit. The patient has no complaints today. She is tolerating her maintenance chemotherapy with single agent Alimta fairly well. The patient denied having any significant nausea or vomiting. She has no fever or chills. The patient denied having any significant chest pain, shortness of breath, or hemoptysis. She has no weight loss or night sweats. She is here today to start cycle # 83 of her treatment.   MEDICAL HISTORY: Past Medical History  Diagnosis Date  . History of migraine headaches   . Hypercholesterolemia   . lung ca dx'd 06/2006    rt and lt lung  . Lung cancer ([facility]) 3/14/1    right-  Adenocarcinoma w/bronchioalveolar features  . FHx: chemotherapy 2008&2011    4 cycles cisplatin,taxotere,2008& carboplatin and Alimta 2011    ALLERGIES:  is allergic to simvastatin and iodinated diagnostic agents.  MEDICATIONS:  Current Outpatient Prescriptions  Medication Sig Dispense Refill  . Calcium Carbonate-Vit D-Min (CALTRATE PLUS PO) Take 500 mg by mouth 2 (two) times daily.      . folic acid (FOLVITE) 1 MG tablet Take 1 mg by mouth daily.      . hydrocortisone 2.5 % cream Reported on 09/28/2015    . Multiple Vitamins-Minerals (CENTRUM SILVER PO) Take 1 tablet by mouth daily.     . potassium gluconate 595 MG TABS Take 595 mg by mouth daily.    . vitamin B-12 (CYANOCOBALAMIN) 500 MCG tablet Take 1,500 mcg by mouth daily.     . chlorpheniramine-HYDROcodone (TUSSIONEX) 10-8 MG/5ML SUER Take 5 mLs by mouth 2 (two) times daily. (Patient not taking: Reported on 09/28/2015) 140 mL 0  . prochlorperazine (COMPAZINE) 10 MG tablet Take 1 tablet (10 mg total) by mouth every 6 (six) hours as needed. (Patient not taking: Reported on 09/28/2015) 15 tablet 1   No current facility-administered medications for this visit.    REVIEW OF SYSTEMS:  A comprehensive review of systems was negative.   PHYSICAL EXAMINATION: General appearance: alert, cooperative and no distress Head: Normocephalic, without obvious abnormality, atraumatic Neck: no adenopathy Lymph nodes: Cervical, supraclavicular, and axillary nodes normal. Resp: clear to auscultation bilaterally Back: symmetric, no curvature. ROM normal. No CVA tenderness. Cardio: regular rate and rhythm, S1, S2 normal, no murmur, click, rub  or gallop GI: soft, non-tender; bowel sounds normal; no masses,  no organomegaly Extremities: extremities normal, atraumatic, no cyanosis or edema Neurologic: Alert and oriented X 3, normal strength and tone. Normal symmetric reflexes. Normal coordination and gait  ECOG PERFORMANCE STATUS: 0 - Asymptomatic  Blood  pressure 131/73, pulse 72, temperature 98 F (36.7 C), temperature source Oral, resp. rate 18, height '5\' 2"'$  (1.575 m), weight 141 lb 14.4 oz (64.365 kg), SpO2 100 %.  LABORATORY DATA: Lab Results  Component Value Date   WBC 3.2* 10/11/2015   HGB 12.1 10/11/2015   HCT 36.4 10/11/2015   MCV 99.7 10/11/2015   PLT 246 10/11/2015      Chemistry      Component Value Date/Time   NA 141 10/11/2015 0849   NA 139 01/24/2012 1008   NA 140 08/21/2011 1306   K 3.7 10/11/2015 0849   K 4.2 01/24/2012 1008   K 4.1 08/21/2011 1306   CL 108* 11/04/2012 0947   CL 103 01/24/2012 1008   CL 107 08/21/2011 1306   CO2 25 10/11/2015 0849   CO2 28 01/24/2012 1008   CO2 22 08/21/2011 1306   BUN 10.9 10/11/2015 0849   BUN 13 01/24/2012 1008   BUN 11 08/21/2011 1306   CREATININE 0.8 10/11/2015 0849   CREATININE 0.8 01/24/2012 1008   CREATININE 0.81 08/21/2011 1306      Component Value Date/Time   CALCIUM 9.1 10/11/2015 0849   CALCIUM 8.6 01/24/2012 1008   CALCIUM 8.8 08/21/2011 1306   ALKPHOS 85 10/11/2015 0849   ALKPHOS 79 01/24/2012 1008   ALKPHOS 87 08/21/2011 1306   AST 21 10/11/2015 0849   AST 24 01/24/2012 1008   AST 17 08/21/2011 1306   ALT 10 10/11/2015 0849   ALT 21 01/24/2012 1008   ALT 8 08/21/2011 1306   BILITOT 0.63 10/11/2015 0849   BILITOT 0.90 01/24/2012 1008   BILITOT 0.6 08/21/2011 1306       RADIOGRAPHIC STUDIES: Ct Chest Wo Contrast  09/19/2015  CLINICAL DATA:  Lung cancer diagnosed 06/2006. Chemotherapy in progress. Restaging. In contour for anti neoplastic chemotherapy. EXAM: CT CHEST WITHOUT CONTRAST TECHNIQUE: Multidetector CT imaging of the chest was performed following the standard protocol without IV contrast. COMPARISON:  05/30/2015 FINDINGS: Mediastinum/Nodes: No supraclavicular adenopathy. A right-sided Port-A-Cath terminates at the cavoatrial junction. Aortic atherosclerosis. Normal heart size, without pericardial effusion. LAD coronary artery  atherosclerosis. No mediastinal or definite hilar adenopathy, given limitations of unenhanced CT. Esophageal fluid level on image 44/series 2. Lungs/Pleura: Mild pleural thickening, adjacent to the dominant right upper lobe lung mass. Mild biapical pleural-parenchymal scarring. Right upper lobe lung mass measures on the order of 6.0 x 4.5 cm on image 43/series 5. Compare 4.5 x 3.4 cm on 05/30/2015. On sagittal image 49 measures 3.5 x 9.7 cm today versus 2.9 by 9.8 cm on the prior. 2.7 cm on coronal image 43 today versus similar. A 4 mm left upper lobe pulmonary nodule on image 49/series 5 is similar. Lingular and right middle lobe nodular foci. Example right middle lobe 1.4 cm on image 83/ series 5, similar. Lingular consolidation at 1.3 cm on image 75/ series 5, similar. Upper abdomen: Normal imaged portions of the liver, spleen, stomach, pancreas, adrenal glands. Mild renal cortical thinning bilaterally. Suspect an interpolar right renal lesion which is too small to characterize at approximately 10 mm. Present back in 2015. Musculoskeletal: Mild osteopenia. Accentuation of expected thoracic kyphosis. Mild superior endplate irregularity at multiple lower thoracic levels,  similar. Schmorl's node deformities favored over minimal compression deformity. IMPRESSION: 1. Similar to minimal enlargement of right upper lobe lung mass. 2. No thoracic adenopathy. 3. Areas of right middle lobe and lingular nodular consolidation are similar. 4.  Atherosclerosis, including within the coronary arteries. 5. Esophageal air fluid level suggests dysmotility or gastroesophageal reflux. Electronically Signed   By: Abigail Miyamoto M.D.   On: 09/19/2015 13:46   ASSESSMENT AND PLAN: This is a very pleasant 82 years old white female with recurrent non-small cell lung cancer, adenocarcinoma. The patient is currently undergoing maintenance chemotherapy with single agent Alimta status post 82 cycles and she is tolerating it fairly well.  Her  absolute neutrophil count is low today but the patient does not like to delay her treatment. I recommended for her to continue her treatment with maintenance Alimta.  She will proceed with cycle #83 today as a scheduled. I would see her back for follow-up visit in 3 weeks for evaluation before starting cycle #84. She is scheduled for palliative radiotherapy to the enlarging right upper lobe lung mass in June 2017. She was advised to call immediately if she has any concerning symptoms in the interval especially any fever or chills.  Disclaimer: This note was dictated with voice recognition software. Similar sounding words can inadvertently be transcribed and may not be corrected upon review.  [name]., MD 10/11/2015

## 2015-10-11 NOTE — Telephone Encounter (Signed)
Gave pt apt & avs °

## 2015-10-11 NOTE — Progress Notes (Signed)
Ok to continue with treatment today, per Dr.Mohamed's note.

## 2015-10-11 NOTE — Patient Instructions (Signed)
Cancer Center Discharge Instructions for Patients Receiving Chemotherapy  Today you received the following chemotherapy agents alimta  To help prevent nausea and vomiting after your treatment, we encourage you to take your nausea medication as directed  If you develop nausea and vomiting that is not controlled by your nausea medication, call the clinic.   BELOW ARE SYMPTOMS THAT SHOULD BE REPORTED IMMEDIATELY:  *FEVER GREATER THAN 100.5 F  *CHILLS WITH OR WITHOUT FEVER  NAUSEA AND VOMITING THAT IS NOT CONTROLLED WITH YOUR NAUSEA MEDICATION  *UNUSUAL SHORTNESS OF BREATH  *UNUSUAL BRUISING OR BLEEDING  TENDERNESS IN MOUTH AND THROAT WITH OR WITHOUT PRESENCE OF ULCERS  *URINARY PROBLEMS  *BOWEL PROBLEMS  UNUSUAL RASH Items with * indicate a potential emergency and should be followed up as soon as possible.  Feel free to call the clinic you have any questions or concerns. The clinic phone number is [phone].  

## 2015-10-24 ENCOUNTER — Ambulatory Visit
Admission: RE | Admit: 2015-10-24 | Discharge: 2015-10-24 | Disposition: A | Discharge status: Home / Self Care | Payer: Medicare Other | Source: Ambulatory Visit | Attending: Radiation Oncology | Admitting: Radiation Oncology

## 2015-10-24 DIAGNOSIS — C3411 Malignant neoplasm of upper lobe, right bronchus or lung: Secondary | ICD-10-CM

## 2015-10-24 DIAGNOSIS — Z510 Encounter for antineoplastic radiation therapy: Secondary | ICD-10-CM | POA: Diagnosis not present

## 2015-10-24 NOTE — Progress Notes (Signed)
[facility] Simulation and Treatment Planning Note   Name: [name] MRN: [id number]  Date: 10/24/2015  DOB: [birth year]  Status: Outpatient    DIAGNOSIS: Bilateral lung cancer ([place])   Staging form: Lung, AJCC 7th Edition     Clinical: Stage IB (T2a, N0, M0, Free text: Recurrent non-small cell lung cancer initially diagnosed as stage Ib) - Signed by Curt Bears, MD on 11/17/2013     CONSENT VERIFIED: yes   SET UP AND IMMOBILIZATION: Patient is setup supine with arms in a wing board.   NARRATIVE: The patient was brought to the [facility].  Identity was confirmed.  All relevant records and images related to the planned course of therapy were reviewed.  Then, the patient was positioned in a stable reproducible clinical set-up for radiation therapy.  CT images were obtained.  Skin markings were placed.  The CT images were loaded into the planning software where the target and avoidance structures were contoured.  The radiation prescription was entered and confirmed.   TREATMENT PLANNING NOTE:  Treatment planning then occurred. I have requested 3D simulation with [facility] of the spinal cord, total lungs and gross tumor volume. I have also requested mlcs and an isodose plan.    A total of 4 complex treatment devices will be utilized in the form of 4 mlcs which be used for beam modification purposes.     ------------------------------------------------  Thea Silversmith, MD    This document serves as a record of services personally performed by Thea Silversmith, MD. It was created on her behalf by  Lendon Collar, a trained medical scribe. The creation of this record is based on the scribe's personal observations and the provider's statements to them. This document has been checked and approved by the attending provider.

## 2015-10-25 DIAGNOSIS — Z510 Encounter for antineoplastic radiation therapy: Secondary | ICD-10-CM | POA: Diagnosis not present

## 2015-10-31 ENCOUNTER — Ambulatory Visit: Payer: Medicare Other | Admitting: Radiation Oncology

## 2015-11-01 ENCOUNTER — Telehealth: Payer: Self-pay | Admitting: *Deleted

## 2015-11-01 ENCOUNTER — Telehealth: Payer: Self-pay | Admitting: Internal Medicine

## 2015-11-01 ENCOUNTER — Encounter: Payer: Self-pay | Admitting: Internal Medicine

## 2015-11-01 ENCOUNTER — Ambulatory Visit (HOSPITAL_BASED_OUTPATIENT_CLINIC_OR_DEPARTMENT_OTHER): Payer: Medicare Other

## 2015-11-01 ENCOUNTER — Ambulatory Visit
Admission: RE | Admit: 2015-11-01 | Discharge: 2015-11-01 | Disposition: A | Discharge status: Home / Self Care | Payer: Medicare Other | Source: Ambulatory Visit | Attending: Radiation Oncology | Admitting: Radiation Oncology

## 2015-11-01 ENCOUNTER — Ambulatory Visit (HOSPITAL_BASED_OUTPATIENT_CLINIC_OR_DEPARTMENT_OTHER): Payer: Medicare Other | Admitting: Internal Medicine

## 2015-11-01 ENCOUNTER — Other Ambulatory Visit (HOSPITAL_BASED_OUTPATIENT_CLINIC_OR_DEPARTMENT_OTHER): Payer: Medicare Other

## 2015-11-01 VITALS — BP 126/52 | HR 70 | Temp 97.8°F | Resp 18 | Ht 62.0 in | Wt 144.1 lb

## 2015-11-01 DIAGNOSIS — Z5111 Encounter for antineoplastic chemotherapy: Secondary | ICD-10-CM

## 2015-11-01 DIAGNOSIS — C3491 Malignant neoplasm of unspecified part of right bronchus or lung: Secondary | ICD-10-CM | POA: Diagnosis not present

## 2015-11-01 DIAGNOSIS — C3412 Malignant neoplasm of upper lobe, left bronchus or lung: Secondary | ICD-10-CM

## 2015-11-01 DIAGNOSIS — Z510 Encounter for antineoplastic radiation therapy: Secondary | ICD-10-CM | POA: Diagnosis not present

## 2015-11-01 DIAGNOSIS — C3411 Malignant neoplasm of upper lobe, right bronchus or lung: Secondary | ICD-10-CM

## 2015-11-01 DIAGNOSIS — C3492 Malignant neoplasm of unspecified part of left bronchus or lung: Principal | ICD-10-CM

## 2015-11-01 LAB — CBC WITH DIFFERENTIAL/PLATELET
BASO%: 0.7 % (ref 0.0–2.0)
BASOS ABS: 0 10*3/uL (ref 0.0–0.1)
EOS%: 1.3 % (ref 0.0–7.0)
Eosinophils Absolute: 0 10*3/uL (ref 0.0–0.5)
HEMATOCRIT: 36.2 % (ref 34.8–46.6)
HGB: 11.9 g/dL (ref 11.6–15.9)
LYMPH#: 1.2 10*3/uL (ref 0.9–3.3)
LYMPH%: 38.8 % (ref 14.0–49.7)
MCH: 33.5 pg (ref 25.1–34.0)
MCHC: 32.9 g/dL (ref 31.5–36.0)
MCV: 102 fL — ABNORMAL HIGH (ref 79.5–101.0)
MONO#: 0.4 10*3/uL (ref 0.1–0.9)
MONO%: 13.7 % (ref 0.0–14.0)
NEUT#: 1.4 10*3/uL — ABNORMAL LOW (ref 1.5–6.5)
NEUT%: 45.5 % (ref 38.4–76.8)
Platelets: 249 10*3/uL (ref 145–400)
RBC: 3.55 10*6/uL — ABNORMAL LOW (ref 3.70–5.45)
RDW: 15.1 % — ABNORMAL HIGH (ref 11.2–14.5)
WBC: 3.1 10*3/uL — ABNORMAL LOW (ref 3.9–10.3)

## 2015-11-01 LAB — COMPREHENSIVE METABOLIC PANEL
ALT: 9 U/L (ref 0–55)
ANION GAP: 8 meq/L (ref 3–11)
AST: 20 U/L (ref 5–34)
Albumin: 3.2 g/dL — ABNORMAL LOW (ref 3.5–5.0)
Alkaline Phosphatase: 79 U/L (ref 40–150)
BUN: 14.7 mg/dL (ref 7.0–26.0)
CALCIUM: 8.7 mg/dL (ref 8.4–10.4)
CHLORIDE: 108 meq/L (ref 98–109)
CO2: 25 mEq/L (ref 22–29)
Creatinine: 0.9 mg/dL (ref 0.6–1.1)
EGFR: 64 mL/min/{1.73_m2} — AB (ref >90)
Glucose: 80 mg/dl (ref 70–140)
POTASSIUM: 4.5 meq/L (ref 3.5–5.1)
Sodium: 141 mEq/L (ref 136–145)
Total Bilirubin: 0.54 mg/dL (ref 0.20–1.20)
Total Protein: 6.8 g/dL (ref 6.4–8.3)

## 2015-11-01 MED ORDER — SODIUM CHLORIDE 0.9 % IV SOLN
Freq: Once | INTRAVENOUS | Status: AC
  Administered 2015-11-01: 10:00:00 via INTRAVENOUS

## 2015-11-01 MED ORDER — CYANOCOBALAMIN 1000 MCG/ML IJ SOLN
INTRAMUSCULAR | Status: AC
  Filled 2015-11-01: qty 1

## 2015-11-01 MED ORDER — CYANOCOBALAMIN 1000 MCG/ML IJ SOLN
1000.0000 ug | Freq: Once | INTRAMUSCULAR | Status: AC
  Administered 2015-11-01: 1000 ug via INTRAMUSCULAR

## 2015-11-01 MED ORDER — SODIUM CHLORIDE 0.9 % IV SOLN
Freq: Once | INTRAVENOUS | Status: AC
  Administered 2015-11-01: 10:00:00 via INTRAVENOUS
  Filled 2015-11-01: qty 8

## 2015-11-01 MED ORDER — SODIUM CHLORIDE 0.9 % IV SOLN
500.0000 mg/m2 | Freq: Once | INTRAVENOUS | Status: AC
  Administered 2015-11-01: 850 mg via INTRAVENOUS
  Filled 2015-11-01: qty 34

## 2015-11-01 MED ORDER — HEPARIN SOD (PORK) LOCK FLUSH 100 UNIT/ML IV SOLN
500.0000 [IU] | Freq: Once | INTRAVENOUS | Status: AC | PRN
  Administered 2015-11-01: 500 [IU]
  Filled 2015-11-01: qty 5

## 2015-11-01 MED ORDER — SODIUM CHLORIDE 0.9 % IJ SOLN
10.0000 mL | INTRAMUSCULAR | Status: DC | PRN
Start: 2015-11-01 — End: 2015-11-01
  Administered 2015-11-01: 10 mL
  Filled 2015-11-01: qty 10

## 2015-11-01 NOTE — Telephone Encounter (Signed)
per pfo to sch pt appt-sent MW emailt o sch trmt-pt to get updated copy b4 leaving

## 2015-11-01 NOTE — Progress Notes (Signed)
Per MD, " ok to treat despite counts" ANC 1.4

## 2015-11-01 NOTE — Telephone Encounter (Signed)
Per staff message and POF I have scheduled appts. Advised scheduler of appts. JMW  

## 2015-11-01 NOTE — Progress Notes (Signed)
[facility] [phone]   Fax:(336) [place], MD [place] [address]  Principle Diagnosis and stage: Recurrent non-small cell lung cancer, initially diagnosed a stage IB (T2, N0, M0) adenocarcinoma in June 2008 with a tumor size of 8 cm.   Prior Therapy:  #1 status post left upper lobectomy with lymph node dissection under the care of Dr. Arlyce Dice on 06/29/2006.  #2 status post 4 cycles of adjuvant chemotherapy with cisplatin and Taxotere last dose given 08/01/2006.  #3 status post 6 cycles of systemic chemotherapy with carboplatin and Alimta for disease recurrence last dose given 12/12/2009.   Current therapy: Maintenance chemotherapy with Alimta at 500 mg per meter squared given every 3 weeks status post 83 cycles.  CHEMOTHERAPY INTENT: Palliative/maintenance.  CURRENT # OF CHEMOTHERAPY CYCLES: 84  CURRENT ANTIEMETICS: Zofran, dexamethasone and Compazine  CURRENT SMOKING STATUS: Never Smoker.   ORAL CHEMOTHERAPY AND CONSENT: None   CURRENT BISPHOSPHONATES USE: None  PAIN MANAGEMENT: None  NARCOTICS INDUCED CONSTIPATION: None  LIVING WILL AND CODE STATUS: has living will for no CODE BLUE.  INTERVAL HISTORY: [name] 82 y.o. female returns to the clinic today for follow up visit. The patient has no complaints today. She is tolerating her maintenance chemotherapy with single agent Alimta fairly well. She is expected to start palliative radiotherapy to the enlarging right upper lobe lung mass today. The patient denied having any significant nausea or vomiting. She has no fever or chills. The patient denied having any significant chest pain, shortness of breath, or hemoptysis. She has no weight loss or night sweats. She is here today to start cycle # 6 of her treatment.   MEDICAL HISTORY: Past Medical History  Diagnosis Date  . History of migraine headaches   .  Hypercholesterolemia   . lung ca dx'd 06/2006    rt and lt lung  . Lung cancer ([place]) 3/14/1    right- Adenocarcinoma w/bronchioalveolar features  . FHx: chemotherapy 2008&2011    4 cycles cisplatin,taxotere,2008& carboplatin and Alimta 2011    ALLERGIES:  is allergic to simvastatin and iodinated diagnostic agents.  MEDICATIONS:  Current Outpatient Prescriptions  Medication Sig Dispense Refill  . Calcium Carbonate-Vit D-Min (CALTRATE PLUS PO) Take 500 mg by mouth 2 (two) times daily.      . chlorpheniramine-HYDROcodone (TUSSIONEX) 10-8 MG/5ML SUER Take 5 mLs by mouth 2 (two) times daily. 644 mL 0  . folic acid (FOLVITE) 1 MG tablet Take 1 mg by mouth daily.      . hydrocortisone 2.5 % cream Reported on 09/28/2015    . Multiple Vitamins-Minerals (CENTRUM SILVER PO) Take 1 tablet by mouth daily.     . potassium gluconate 595 MG TABS Take 595 mg by mouth daily.    . prochlorperazine (COMPAZINE) 10 MG tablet Take 1 tablet (10 mg total) by mouth every 6 (six) hours as needed. 15 tablet 1  . vitamin B-12 (CYANOCOBALAMIN) 500 MCG tablet Take 1,500 mcg by mouth daily.      No current facility-administered medications for this visit.    REVIEW OF SYSTEMS:  A comprehensive review of systems was negative.   PHYSICAL EXAMINATION: General appearance: alert, cooperative and no distress Head: Normocephalic, without obvious abnormality, atraumatic Neck: no adenopathy Lymph nodes: Cervical, supraclavicular, and axillary nodes normal. Resp: clear to auscultation bilaterally Back: symmetric, no curvature. ROM normal. No CVA tenderness. Cardio: regular rate and rhythm, S1, S2 normal,  no murmur, click, rub or gallop GI: soft, non-tender; bowel sounds normal; no masses,  no organomegaly Extremities: extremities normal, atraumatic, no cyanosis or edema Neurologic: Alert and oriented X 3, normal strength and tone. Normal symmetric reflexes. Normal coordination and gait  ECOG PERFORMANCE STATUS: 0 -  Asymptomatic  Blood pressure 126/52, pulse 70, temperature 97.8 F (36.6 C), temperature source Oral, resp. rate 18, height '5\' 2"'$  (1.575 m), weight 144 lb 1.6 oz (65.363 kg), SpO2 100 %.  LABORATORY DATA: Lab Results  Component Value Date   WBC 3.1* 11/01/2015   HGB 11.9 11/01/2015   HCT 36.2 11/01/2015   MCV 102.0* 11/01/2015   PLT 249 11/01/2015      Chemistry      Component Value Date/Time   NA 141 10/11/2015 0849   NA 139 01/24/2012 1008   NA 140 08/21/2011 1306   K 3.7 10/11/2015 0849   K 4.2 01/24/2012 1008   K 4.1 08/21/2011 1306   CL 108* 11/04/2012 0947   CL 103 01/24/2012 1008   CL 107 08/21/2011 1306   CO2 25 10/11/2015 0849   CO2 28 01/24/2012 1008   CO2 22 08/21/2011 1306   BUN 10.9 10/11/2015 0849   BUN 13 01/24/2012 1008   BUN 11 08/21/2011 1306   CREATININE 0.8 10/11/2015 0849   CREATININE 0.8 01/24/2012 1008   CREATININE 0.81 08/21/2011 1306      Component Value Date/Time   CALCIUM 9.1 10/11/2015 0849   CALCIUM 8.6 01/24/2012 1008   CALCIUM 8.8 08/21/2011 1306   ALKPHOS 85 10/11/2015 0849   ALKPHOS 79 01/24/2012 1008   ALKPHOS 87 08/21/2011 1306   AST 21 10/11/2015 0849   AST 24 01/24/2012 1008   AST 17 08/21/2011 1306   ALT 10 10/11/2015 0849   ALT 21 01/24/2012 1008   ALT 8 08/21/2011 1306   BILITOT 0.63 10/11/2015 0849   BILITOT 0.90 01/24/2012 1008   BILITOT 0.6 08/21/2011 1306       RADIOGRAPHIC STUDIES: No results found. ASSESSMENT AND PLAN: This is a very pleasant 82 years old white female with recurrent non-small cell lung cancer, adenocarcinoma. The patient is currently undergoing maintenance chemotherapy with single agent Alimta status post 83 cycles and she is tolerating it fairly well.  I recommended for her to continue her treatment with maintenance Alimta.  She will proceed with cycle #84 today as a scheduled. I would see her back for follow-up visit in 3 weeks for evaluation before starting cycle #85. She is scheduled to  start palliative radiotherapy to the enlarging right upper lobe lung mass today. She was advised to call immediately if she has any concerning symptoms in the interval especially any fever or chills.  Disclaimer: This note was dictated with voice recognition software. Similar sounding words can inadvertently be transcribed and may not be corrected upon review.  [name]., MD 11/01/2015

## 2015-11-01 NOTE — Patient Instructions (Signed)
[facility] Discharge Instructions for Patients Receiving Chemotherapy  Today you received the following chemotherapy agents Alimta and Vitamin B12  To help prevent nausea and vomiting after your treatment, we encourage you to take your nausea medication.   If you develop nausea and vomiting that is not controlled by your nausea medication, call the clinic.   BELOW ARE SYMPTOMS THAT SHOULD BE REPORTED IMMEDIATELY:  *FEVER GREATER THAN 100.5 F  *CHILLS WITH OR WITHOUT FEVER  NAUSEA AND VOMITING THAT IS NOT CONTROLLED WITH YOUR NAUSEA MEDICATION  *UNUSUAL SHORTNESS OF BREATH  *UNUSUAL BRUISING OR BLEEDING  TENDERNESS IN MOUTH AND THROAT WITH OR WITHOUT PRESENCE OF ULCERS  *URINARY PROBLEMS  *BOWEL PROBLEMS  UNUSUAL RASH Items with * indicate a potential emergency and should be followed up as soon as possible.  Feel free to call the clinic you have any questions or concerns. The clinic phone number is [phone].  Please show the [facility] at check-in to the Emergency Department and triage nurse.

## 2015-11-01 NOTE — Progress Notes (Signed)
Ok to treat with ANC 1.4 per Dr Julien Nordmann.

## 2015-11-02 ENCOUNTER — Ambulatory Visit
Admission: RE | Admit: 2015-11-02 | Discharge: 2015-11-02 | Disposition: A | Discharge status: Home / Self Care | Payer: Medicare Other | Source: Ambulatory Visit | Attending: Radiation Oncology | Admitting: Radiation Oncology

## 2015-11-02 DIAGNOSIS — Z510 Encounter for antineoplastic radiation therapy: Secondary | ICD-10-CM | POA: Diagnosis not present

## 2015-11-03 ENCOUNTER — Ambulatory Visit
Admission: RE | Admit: 2015-11-03 | Discharge: 2015-11-03 | Disposition: A | Discharge status: Home / Self Care | Payer: Medicare Other | Source: Ambulatory Visit | Attending: Radiation Oncology | Admitting: Radiation Oncology

## 2015-11-03 DIAGNOSIS — C3491 Malignant neoplasm of unspecified part of right bronchus or lung: Secondary | ICD-10-CM

## 2015-11-03 DIAGNOSIS — Z510 Encounter for antineoplastic radiation therapy: Secondary | ICD-10-CM | POA: Diagnosis not present

## 2015-11-03 DIAGNOSIS — C3492 Malignant neoplasm of unspecified part of left bronchus or lung: Principal | ICD-10-CM

## 2015-11-03 MED ORDER — RADIAPLEXRX EX GEL
Freq: Once | CUTANEOUS | Status: AC
  Administered 2015-11-03: 15:00:00 via TOPICAL

## 2015-11-06 ENCOUNTER — Ambulatory Visit
Admission: RE | Admit: 2015-11-06 | Discharge: 2015-11-06 | Disposition: A | Discharge status: Home / Self Care | Payer: Medicare Other | Source: Ambulatory Visit | Attending: Radiation Oncology | Admitting: Radiation Oncology

## 2015-11-06 ENCOUNTER — Encounter: Payer: Self-pay | Admitting: Radiation Oncology

## 2015-11-06 VITALS — BP 134/74 | HR 78 | Temp 98.0°F | Resp 18 | Ht 62.0 in | Wt 143.0 lb

## 2015-11-06 DIAGNOSIS — Z510 Encounter for antineoplastic radiation therapy: Secondary | ICD-10-CM | POA: Diagnosis not present

## 2015-11-06 DIAGNOSIS — C3491 Malignant neoplasm of unspecified part of right bronchus or lung: Secondary | ICD-10-CM

## 2015-11-06 DIAGNOSIS — C3492 Malignant neoplasm of unspecified part of left bronchus or lung: Principal | ICD-10-CM

## 2015-11-06 NOTE — Progress Notes (Signed)
Mrs.Turner has received 4 fractions to her right lung.  Skin to right chest with normal color.  Using Radiaplex gel.  Appetite is fair.  Drank boost once and it made under  left breast burn.  Does not want to try the boost anymore.  Having fatigue most of the day from her chemotherapy.  Denies pain. BP 134/74 mmHg  Pulse 78  Temp(Src) 98 F (36.7 C) (Oral)  Resp 18  Ht '5\' 2"'$  (1.575 m)  Wt 143 lb (64.864 kg)  BMI 26.15 kg/m2  SpO2 100%

## 2015-11-06 NOTE — Progress Notes (Signed)
   Weekly Management Note:  outpatient  Right lung cancer  Current Dose:  12 Gy  Projected Dose: 30 Gy   Narrative:  The patient presents for routine under treatment assessment.  CBCT/MVCT images/Port film x-rays were reviewed.  The chart was checked. No acute effects from RT.  Poor tolerance of Boost drinks  Physical Findings:  Wt Readings from Last 3 Encounters:  11/06/15 143 lb (64.864 kg)  11/01/15 144 lb 1.6 oz (65.363 kg)  10/11/15 141 lb 14.4 oz (64.365 kg)    height is '5\' 2"'$  (1.575 m) and weight is 143 lb (64.864 kg). Her oral temperature is 98 F (36.7 C). Her blood pressure is 134/74 and her pulse is 78. Her respiration is 18 and oxygen saturation is 100%.  NAD, ambulatory, breathing room air  CBC    Component Value Date/Time   WBC 3.1* 11/01/2015 0908   WBC 5.1 08/14/2009 0758   RBC 3.55* 11/01/2015 0908   RBC 4.04 08/14/2009 0758   HGB 11.9 11/01/2015 0908   HGB 12.7 08/14/2009 0758   HCT 36.2 11/01/2015 0908   HCT 39.2 08/14/2009 0758   PLT 249 11/01/2015 0908   PLT 201 08/14/2009 0758   MCV 102.0* 11/01/2015 0908   MCV 96.9 08/14/2009 0758   MCH 33.5 11/01/2015 0908   MCH 33.2 06/19/2010 1019   MCHC 32.9 11/01/2015 0908   MCHC 32.4 08/14/2009 0758   RDW 15.1* 11/01/2015 0908   RDW 13.4 08/14/2009 0758   LYMPHSABS 1.2 11/01/2015 0908   MONOABS 0.4 11/01/2015 0908   EOSABS 0.0 11/01/2015 0908   BASOSABS 0.0 11/01/2015 0908     CMP     Component Value Date/Time   NA 141 11/01/2015 0908   NA 139 01/24/2012 1008   NA 140 08/21/2011 1306   K 4.5 11/01/2015 0908   K 4.2 01/24/2012 1008   K 4.1 08/21/2011 1306   CL 108* 11/04/2012 0947   CL 103 01/24/2012 1008   CL 107 08/21/2011 1306   CO2 25 11/01/2015 0908   CO2 28 01/24/2012 1008   CO2 22 08/21/2011 1306   GLUCOSE 80 11/01/2015 0908   GLUCOSE 104* 11/04/2012 0947   GLUCOSE 92 01/24/2012 1008   GLUCOSE 89 08/21/2011 1306   BUN 14.7 11/01/2015 0908   BUN 13 01/24/2012 1008   BUN 11  08/21/2011 1306   CREATININE 0.9 11/01/2015 0908   CREATININE 0.8 01/24/2012 1008   CREATININE 0.81 08/21/2011 1306   CALCIUM 8.7 11/01/2015 0908   CALCIUM 8.6 01/24/2012 1008   CALCIUM 8.8 08/21/2011 1306   PROT 6.8 11/01/2015 0908   PROT 6.7 01/24/2012 1008   PROT 6.4 08/21/2011 1306   ALBUMIN 3.2* 11/01/2015 0908   ALBUMIN 3.6 01/24/2012 1008   ALBUMIN 4.0 08/21/2011 1306   AST 20 11/01/2015 0908   AST 24 01/24/2012 1008   AST 17 08/21/2011 1306   ALT 9 11/01/2015 0908   ALT 21 01/24/2012 1008   ALT 8 08/21/2011 1306   ALKPHOS 79 11/01/2015 0908   ALKPHOS 79 01/24/2012 1008   ALKPHOS 87 08/21/2011 1306   BILITOT 0.54 11/01/2015 0908   BILITOT 0.90 01/24/2012 1008   BILITOT 0.6 08/21/2011 1306     Impression:  The patient is tolerating radiotherapy.   Plan:  Continue radiotherapy as planned.  Discussed other nutritional supplements to try in lieu of BOOST  -----------------------------------  Eppie Gibson, MD

## 2015-11-07 ENCOUNTER — Ambulatory Visit
Admission: RE | Admit: 2015-11-07 | Discharge: 2015-11-07 | Disposition: A | Discharge status: Home / Self Care | Payer: Medicare Other | Source: Ambulatory Visit | Attending: Radiation Oncology | Admitting: Radiation Oncology

## 2015-11-07 ENCOUNTER — Encounter: Payer: Self-pay | Admitting: Radiation Oncology

## 2015-11-07 DIAGNOSIS — Z510 Encounter for antineoplastic radiation therapy: Secondary | ICD-10-CM | POA: Diagnosis not present

## 2015-11-08 ENCOUNTER — Ambulatory Visit
Admission: RE | Admit: 2015-11-08 | Discharge: 2015-11-08 | Disposition: A | Discharge status: Home / Self Care | Payer: Medicare Other | Source: Ambulatory Visit | Attending: Radiation Oncology | Admitting: Radiation Oncology

## 2015-11-08 DIAGNOSIS — Z510 Encounter for antineoplastic radiation therapy: Secondary | ICD-10-CM | POA: Diagnosis not present

## 2015-11-09 ENCOUNTER — Ambulatory Visit
Admission: RE | Admit: 2015-11-09 | Discharge: 2015-11-09 | Disposition: A | Discharge status: Home / Self Care | Payer: Medicare Other | Source: Ambulatory Visit | Attending: Radiation Oncology | Admitting: Radiation Oncology

## 2015-11-09 DIAGNOSIS — Z510 Encounter for antineoplastic radiation therapy: Secondary | ICD-10-CM | POA: Diagnosis not present

## 2015-11-10 ENCOUNTER — Ambulatory Visit
Admission: RE | Admit: 2015-11-10 | Discharge: 2015-11-10 | Disposition: A | Discharge status: Home / Self Care | Payer: Medicare Other | Source: Ambulatory Visit | Attending: Radiation Oncology | Admitting: Radiation Oncology

## 2015-11-10 DIAGNOSIS — Z510 Encounter for antineoplastic radiation therapy: Secondary | ICD-10-CM | POA: Diagnosis not present

## 2015-11-13 ENCOUNTER — Ambulatory Visit
Admission: RE | Admit: 2015-11-13 | Discharge: 2015-11-13 | Disposition: A | Discharge status: Home / Self Care | Payer: Medicare Other | Source: Ambulatory Visit | Attending: Radiation Oncology | Admitting: Radiation Oncology

## 2015-11-13 DIAGNOSIS — Z510 Encounter for antineoplastic radiation therapy: Secondary | ICD-10-CM | POA: Diagnosis not present

## 2015-11-14 ENCOUNTER — Encounter: Payer: Self-pay | Admitting: Radiation Oncology

## 2015-11-14 ENCOUNTER — Ambulatory Visit
Admission: RE | Admit: 2015-11-14 | Discharge: 2015-11-14 | Disposition: A | Discharge status: Home / Self Care | Payer: Medicare Other | Source: Ambulatory Visit | Attending: Radiation Oncology | Admitting: Radiation Oncology

## 2015-11-14 VITALS — BP 135/74 | HR 74 | Temp 97.7°F | Ht 62.0 in | Wt 141.0 lb

## 2015-11-14 DIAGNOSIS — C3491 Malignant neoplasm of unspecified part of right bronchus or lung: Secondary | ICD-10-CM

## 2015-11-14 DIAGNOSIS — Z510 Encounter for antineoplastic radiation therapy: Secondary | ICD-10-CM | POA: Diagnosis not present

## 2015-11-14 NOTE — Progress Notes (Addendum)
Ms. Clouatre is here for her final treatment of radiation to her Right Lung. She denies pain. She reports some shortness of breath with exertion. She notices some wheezing when she lays down to go to sleep at night. She reports a cough with occasional white sputum production. She reports she is eating well. She does have some fatigue, which she relates to chemotherapy. She was given a follow up appointment today. She has questions regarding the continued use of Radiaplex or can she switch to a Vitamin E cream when she finishes her current tube of Radiaplex.   BP 135/74 mmHg  Pulse 74  Temp(Src) 97.7 F (36.5 C)  Ht '5\' 2"'$  (1.575 m)  Wt 141 lb (63.957 kg)  BMI 25.78 kg/m2  SpO2 100%   Wt Readings from Last 3 Encounters:  11/14/15 141 lb (63.957 kg)  11/06/15 143 lb (64.864 kg)  11/01/15 144 lb 1.6 oz (65.363 kg)

## 2015-11-14 NOTE — Progress Notes (Signed)
  Radiation Oncology         [phone] ________________________________  Name: [name] MRN: [id number]  Date: 11/14/2015  DOB: Sep 09, 1932  End of Treatment Note  Diagnosis:   Bilateral lung cancer ([place])   Staging form: Lung, AJCC 7th Edition     Clinical: Stage IB (T2a, N0, M0, Free text: Recurrent non-small cell lung cancer initially diagnosed as stage Ib) - Signed by Curt Bears, MD on 11/17/2013     Indication for treatment:  Curative       Radiation treatment dates:   10/22/15-11/14/15  Site/dose:   Right upper chest / 30 Gy in 10 fractions at 3 Gy per fraction.   Beams/energy:   3D conformal treatment with 6,10 and 15 MV photons.   Narrative: The patient tolerated radiation treatment relatively well.   She had no ill effects of treatment except for a slight cough.   Plan: The patient has completed radiation treatment. The patient will return to radiation oncology clinic for routine followup in one month. I advised them to call or return sooner if they have any questions or concerns related to their recovery or treatment.  ------------------------------------------------  Thea Silversmith, MD

## 2015-11-21 ENCOUNTER — Other Ambulatory Visit: Payer: Self-pay | Admitting: Medical Oncology

## 2015-11-22 ENCOUNTER — Telehealth: Payer: Self-pay | Admitting: Internal Medicine

## 2015-11-22 ENCOUNTER — Ambulatory Visit (HOSPITAL_BASED_OUTPATIENT_CLINIC_OR_DEPARTMENT_OTHER): Payer: Medicare Other

## 2015-11-22 ENCOUNTER — Ambulatory Visit (HOSPITAL_BASED_OUTPATIENT_CLINIC_OR_DEPARTMENT_OTHER): Payer: Medicare Other | Admitting: Internal Medicine

## 2015-11-22 ENCOUNTER — Encounter: Payer: Self-pay | Admitting: Internal Medicine

## 2015-11-22 ENCOUNTER — Other Ambulatory Visit (HOSPITAL_BASED_OUTPATIENT_CLINIC_OR_DEPARTMENT_OTHER): Payer: Medicare Other

## 2015-11-22 VITALS — BP 120/62 | HR 78 | Temp 98.3°F | Resp 18 | Ht 62.0 in | Wt 141.5 lb

## 2015-11-22 DIAGNOSIS — C3492 Malignant neoplasm of unspecified part of left bronchus or lung: Principal | ICD-10-CM

## 2015-11-22 DIAGNOSIS — C3491 Malignant neoplasm of unspecified part of right bronchus or lung: Secondary | ICD-10-CM

## 2015-11-22 DIAGNOSIS — Z5111 Encounter for antineoplastic chemotherapy: Secondary | ICD-10-CM

## 2015-11-22 DIAGNOSIS — C3411 Malignant neoplasm of upper lobe, right bronchus or lung: Secondary | ICD-10-CM

## 2015-11-22 DIAGNOSIS — Z85118 Personal history of other malignant neoplasm of bronchus and lung: Secondary | ICD-10-CM | POA: Diagnosis not present

## 2015-11-22 LAB — CBC WITH DIFFERENTIAL/PLATELET
BASO%: 0.8 % (ref 0.0–2.0)
BASOS ABS: 0 10*3/uL (ref 0.0–0.1)
EOS%: 1.2 % (ref 0.0–7.0)
Eosinophils Absolute: 0 10*3/uL (ref 0.0–0.5)
HCT: 37.3 % (ref 34.8–46.6)
HEMOGLOBIN: 12.2 g/dL (ref 11.6–15.9)
LYMPH%: 27 % (ref 14.0–49.7)
MCH: 33.4 pg (ref 25.1–34.0)
MCHC: 32.9 g/dL (ref 31.5–36.0)
MCV: 101.6 fL — ABNORMAL HIGH (ref 79.5–101.0)
MONO#: 0.4 10*3/uL (ref 0.1–0.9)
MONO%: 14.8 % — AB (ref 0.0–14.0)
NEUT%: 56.2 % (ref 38.4–76.8)
NEUTROS ABS: 1.4 10*3/uL — AB (ref 1.5–6.5)
Platelets: 250 10*3/uL (ref 145–400)
RBC: 3.67 10*6/uL — AB (ref 3.70–5.45)
RDW: 15 % — AB (ref 11.2–14.5)
WBC: 2.5 10*3/uL — AB (ref 3.9–10.3)
lymph#: 0.7 10*3/uL — ABNORMAL LOW (ref 0.9–3.3)

## 2015-11-22 LAB — COMPREHENSIVE METABOLIC PANEL
ALBUMIN: 3.2 g/dL — AB (ref 3.5–5.0)
ALK PHOS: 70 U/L (ref 40–150)
ALT: 9 U/L (ref 0–55)
AST: 18 U/L (ref 5–34)
Anion Gap: 9 mEq/L (ref 3–11)
BUN: 15.2 mg/dL (ref 7.0–26.0)
CO2: 23 meq/L (ref 22–29)
Calcium: 9.1 mg/dL (ref 8.4–10.4)
Chloride: 109 mEq/L (ref 98–109)
Creatinine: 0.9 mg/dL (ref 0.6–1.1)
EGFR: 62 mL/min/{1.73_m2} — ABNORMAL LOW (ref >90)
GLUCOSE: 88 mg/dL (ref 70–140)
POTASSIUM: 4.1 meq/L (ref 3.5–5.1)
SODIUM: 140 meq/L (ref 136–145)
Total Bilirubin: 0.44 mg/dL (ref 0.20–1.20)
Total Protein: 7.2 g/dL (ref 6.4–8.3)

## 2015-11-22 MED ORDER — DEXAMETHASONE SODIUM PHOSPHATE 100 MG/10ML IJ SOLN
Freq: Once | INTRAMUSCULAR | Status: AC
  Administered 2015-11-22: 10:00:00 via INTRAVENOUS
  Filled 2015-11-22: qty 8

## 2015-11-22 MED ORDER — SODIUM CHLORIDE 0.9 % IV SOLN
Freq: Once | INTRAVENOUS | Status: AC
  Administered 2015-11-22: 10:00:00 via INTRAVENOUS

## 2015-11-22 MED ORDER — SODIUM CHLORIDE 0.9 % IJ SOLN
10.0000 mL | INTRAMUSCULAR | Status: DC | PRN
  Administered 2015-11-22: 10 mL
  Filled 2015-11-22: qty 10

## 2015-11-22 MED ORDER — HEPARIN SOD (PORK) LOCK FLUSH 100 UNIT/ML IV SOLN
500.0000 [IU] | Freq: Once | INTRAVENOUS | Status: AC | PRN
  Administered 2015-11-22: 500 [IU]
  Filled 2015-11-22: qty 5

## 2015-11-22 MED ORDER — SODIUM CHLORIDE 0.9 % IV SOLN
400.0000 mg/m2 | Freq: Once | INTRAVENOUS | Status: AC
  Administered 2015-11-22: 675 mg via INTRAVENOUS
  Filled 2015-11-22: qty 20

## 2015-11-22 NOTE — Progress Notes (Signed)
[facility] [phone]   Fax:(336) [place], MD [place] [address]  Principle Diagnosis and stage: Recurrent non-small cell lung cancer, initially diagnosed a stage IB (T2, N0, M0) adenocarcinoma in June 2008 with a tumor size of 8 cm.   Prior Therapy:  #1 status post left upper lobectomy with lymph node dissection under the care of Dr. Arlyce Dice on 06/29/2006.  #2 status post 4 cycles of adjuvant chemotherapy with cisplatin and Taxotere last dose given 08/01/2006.  #3 status post 6 cycles of systemic chemotherapy with carboplatin and Alimta for disease recurrence last dose given 12/12/2009.  #4 palliative radiotherapy to the enlarging right upper lobe lung mass under the care of Dr. Pablo Ledger completed on 11/14/2015.  Current therapy: Maintenance chemotherapy with Alimta at 500 mg per meter squared given every 3 weeks status post 84 cycles.  CHEMOTHERAPY INTENT: Palliative/maintenance.  CURRENT # OF CHEMOTHERAPY CYCLES: 85  CURRENT ANTIEMETICS: Zofran, dexamethasone and Compazine  CURRENT SMOKING STATUS: Never Smoker.   ORAL CHEMOTHERAPY AND CONSENT: None   CURRENT BISPHOSPHONATES USE: None  PAIN MANAGEMENT: None  NARCOTICS INDUCED CONSTIPATION: None  LIVING WILL AND CODE STATUS: has living will for no CODE BLUE.  INTERVAL HISTORY: [name] FALEY 82 y.o. female returns to the clinic today for follow up visit. The patient has no complaints today except for mild fatigue. She is tolerating her maintenance chemotherapy with single agent Alimta fairly well. She completed palliative radiotherapy to the enlarging right upper lobe lung mass under the care of Dr. Pablo Ledger. She tolerated it well except for increasing fatigue. The patient denied having any significant nausea or vomiting. She has no fever or chills. The patient denied having any significant chest pain, shortness of breath, or  hemoptysis. She has no weight loss or night sweats. She is here today to start cycle # 85 of her treatment.   MEDICAL HISTORY: Past Medical History  Diagnosis Date  . History of migraine headaches   . Hypercholesterolemia   . lung ca dx'd 06/2006    rt and lt lung  . Lung cancer ([facility]) 3/14/1    right- Adenocarcinoma w/bronchioalveolar features  . FHx: chemotherapy 2008&2011    4 cycles cisplatin,taxotere,2008& carboplatin and Alimta 2011    ALLERGIES:  is allergic to simvastatin and iodinated diagnostic agents.  MEDICATIONS:  Current Outpatient Prescriptions  Medication Sig Dispense Refill  . Calcium Carbonate-Vit D-Min (CALTRATE PLUS PO) Take 500 mg by mouth 2 (two) times daily.      . chlorpheniramine-HYDROcodone (TUSSIONEX) 10-8 MG/5ML SUER Take 5 mLs by mouth 2 (two) times daily. 140 mL 0  . emollient (RADIAGEL) gel Apply topically as needed for wound care. Reported on 1/94/1740    . folic acid (FOLVITE) 1 MG tablet Take 1 mg by mouth daily.      . Multiple Vitamins-Minerals (CENTRUM SILVER PO) Take 1 tablet by mouth daily.     . potassium gluconate 595 MG TABS Take 595 mg by mouth daily.    . vitamin B-12 (CYANOCOBALAMIN) 500 MCG tablet Take 1,500 mcg by mouth daily.     . hydrocortisone 2.5 % cream Reported on 11/22/2015    . prochlorperazine (COMPAZINE) 10 MG tablet Take 1 tablet (10 mg total) by mouth every 6 (six) hours as needed. (Patient not taking: Reported on 11/06/2015) 15 tablet 1  . Wound Dressings (SONAFINE) Apply 1 application topically 2 (two) times daily. Reported on 11/22/2015  No current facility-administered medications for this visit.    REVIEW OF SYSTEMS:  A comprehensive review of systems was negative except for: Constitutional: positive for fatigue   PHYSICAL EXAMINATION: General appearance: alert, cooperative and no distress Head: Normocephalic, without obvious abnormality, atraumatic Neck: no adenopathy Lymph nodes: Cervical, supraclavicular, and  axillary nodes normal. Resp: clear to auscultation bilaterally Back: symmetric, no curvature. ROM normal. No CVA tenderness. Cardio: regular rate and rhythm, S1, S2 normal, no murmur, click, rub or gallop GI: soft, non-tender; bowel sounds normal; no masses,  no organomegaly Extremities: extremities normal, atraumatic, no cyanosis or edema Neurologic: Alert and oriented X 3, normal strength and tone. Normal symmetric reflexes. Normal coordination and gait  ECOG PERFORMANCE STATUS: 1 - Symptomatic but completely ambulatory  Blood pressure 120/62, pulse 78, temperature 98.3 F (36.8 C), temperature source Oral, resp. rate 18, height '5\' 2"'$  (1.575 m), weight 141 lb 8 oz (64.184 kg), SpO2 100 %.  LABORATORY DATA: Lab Results  Component Value Date   WBC 2.5* 11/22/2015   HGB 12.2 11/22/2015   HCT 37.3 11/22/2015   MCV 101.6* 11/22/2015   PLT 250 11/22/2015      Chemistry      Component Value Date/Time   NA 141 11/01/2015 0908   NA 139 01/24/2012 1008   NA 140 08/21/2011 1306   K 4.5 11/01/2015 0908   K 4.2 01/24/2012 1008   K 4.1 08/21/2011 1306   CL 108* 11/04/2012 0947   CL 103 01/24/2012 1008   CL 107 08/21/2011 1306   CO2 25 11/01/2015 0908   CO2 28 01/24/2012 1008   CO2 22 08/21/2011 1306   BUN 14.7 11/01/2015 0908   BUN 13 01/24/2012 1008   BUN 11 08/21/2011 1306   CREATININE 0.9 11/01/2015 0908   CREATININE 0.8 01/24/2012 1008   CREATININE 0.81 08/21/2011 1306      Component Value Date/Time   CALCIUM 8.7 11/01/2015 0908   CALCIUM 8.6 01/24/2012 1008   CALCIUM 8.8 08/21/2011 1306   ALKPHOS 79 11/01/2015 0908   ALKPHOS 79 01/24/2012 1008   ALKPHOS 87 08/21/2011 1306   AST 20 11/01/2015 0908   AST 24 01/24/2012 1008   AST 17 08/21/2011 1306   ALT 9 11/01/2015 0908   ALT 21 01/24/2012 1008   ALT 8 08/21/2011 1306   BILITOT 0.54 11/01/2015 0908   BILITOT 0.90 01/24/2012 1008   BILITOT 0.6 08/21/2011 1306       RADIOGRAPHIC STUDIES: No results  found. ASSESSMENT AND PLAN: This is a very pleasant 82 years old white female with recurrent non-small cell lung cancer, adenocarcinoma. The patient is currently undergoing maintenance chemotherapy with single agent Alimta status post 84 cycles and she is tolerating it fairly well. She is also status post palliative radiotherapy to the enlarging right upper lobe lung mass. I recommended for her to continue her treatment with maintenance Alimta.  She will proceed with cycle #85 today as a scheduled. I would see her back for follow-up visit in 3 weeks for evaluation before starting cycle #86. For the chemotherapy-induced leukocytopenia, we will monitor this closely and consider the patient for growth factors needed. She was advised to call immediately if she has any concerning symptoms in the interval especially any fever or chills.  Disclaimer: This note was dictated with voice recognition software. Similar sounding words can inadvertently be transcribed and may not be corrected upon review.  [name]., MD 11/22/2015

## 2015-11-22 NOTE — Patient Instructions (Signed)
[facility] Discharge Instructions for Patients Receiving Chemotherapy  Today you received the following chemotherapy agents alimta  To help prevent nausea and vomiting after your treatment, we encourage you to take your nausea medication as directed  If you develop nausea and vomiting that is not controlled by your nausea medication, call the clinic.   BELOW ARE SYMPTOMS THAT SHOULD BE REPORTED IMMEDIATELY:  *FEVER GREATER THAN 100.5 F  *CHILLS WITH OR WITHOUT FEVER  NAUSEA AND VOMITING THAT IS NOT CONTROLLED WITH YOUR NAUSEA MEDICATION  *UNUSUAL SHORTNESS OF BREATH  *UNUSUAL BRUISING OR BLEEDING  TENDERNESS IN MOUTH AND THROAT WITH OR WITHOUT PRESENCE OF ULCERS  *URINARY PROBLEMS  *BOWEL PROBLEMS  UNUSUAL RASH Items with * indicate a potential emergency and should be followed up as soon as possible.  Feel free to call the clinic you have any questions or concerns. The clinic phone number is [phone].  

## 2015-11-22 NOTE — Progress Notes (Signed)
OK to tx with ANC 1.4 per Dr. Julien Nordmann

## 2015-11-22 NOTE — Telephone Encounter (Signed)
pt sch already made out-gave pt copy of avs

## 2015-12-13 ENCOUNTER — Other Ambulatory Visit (HOSPITAL_BASED_OUTPATIENT_CLINIC_OR_DEPARTMENT_OTHER): Payer: Medicare Other

## 2015-12-13 ENCOUNTER — Telehealth: Payer: Self-pay | Admitting: Internal Medicine

## 2015-12-13 ENCOUNTER — Encounter: Payer: Self-pay | Admitting: Oncology

## 2015-12-13 ENCOUNTER — Ambulatory Visit (HOSPITAL_BASED_OUTPATIENT_CLINIC_OR_DEPARTMENT_OTHER): Payer: Medicare Other

## 2015-12-13 ENCOUNTER — Ambulatory Visit (HOSPITAL_BASED_OUTPATIENT_CLINIC_OR_DEPARTMENT_OTHER): Payer: Medicare Other | Admitting: Oncology

## 2015-12-13 VITALS — BP 126/68 | HR 69 | Temp 98.1°F | Resp 18 | Ht 62.0 in | Wt 140.9 lb

## 2015-12-13 DIAGNOSIS — C3491 Malignant neoplasm of unspecified part of right bronchus or lung: Secondary | ICD-10-CM

## 2015-12-13 DIAGNOSIS — D72818 Other decreased white blood cell count: Secondary | ICD-10-CM | POA: Diagnosis not present

## 2015-12-13 DIAGNOSIS — C7801 Secondary malignant neoplasm of right lung: Secondary | ICD-10-CM | POA: Diagnosis not present

## 2015-12-13 DIAGNOSIS — Z5111 Encounter for antineoplastic chemotherapy: Secondary | ICD-10-CM

## 2015-12-13 DIAGNOSIS — C3492 Malignant neoplasm of unspecified part of left bronchus or lung: Principal | ICD-10-CM

## 2015-12-13 DIAGNOSIS — C3412 Malignant neoplasm of upper lobe, left bronchus or lung: Secondary | ICD-10-CM

## 2015-12-13 LAB — CBC WITH DIFFERENTIAL/PLATELET
BASO%: 0.8 % (ref 0.0–2.0)
BASOS ABS: 0 10*3/uL (ref 0.0–0.1)
EOS ABS: 0.1 10*3/uL (ref 0.0–0.5)
EOS%: 2.2 % (ref 0.0–7.0)
HEMATOCRIT: 38.1 % (ref 34.8–46.6)
HEMOGLOBIN: 12.5 g/dL (ref 11.6–15.9)
LYMPH#: 0.9 10*3/uL (ref 0.9–3.3)
LYMPH%: 38 % (ref 14.0–49.7)
MCH: 33.2 pg (ref 25.1–34.0)
MCHC: 32.9 g/dL (ref 31.5–36.0)
MCV: 101 fL (ref 79.5–101.0)
MONO#: 0.4 10*3/uL (ref 0.1–0.9)
MONO%: 14.3 % — ABNORMAL HIGH (ref 0.0–14.0)
NEUT#: 1.1 10*3/uL — ABNORMAL LOW (ref 1.5–6.5)
NEUT%: 44.7 % (ref 38.4–76.8)
PLATELETS: 225 10*3/uL (ref 145–400)
RBC: 3.77 10*6/uL (ref 3.70–5.45)
RDW: 14.6 % — AB (ref 11.2–14.5)
WBC: 2.5 10*3/uL — ABNORMAL LOW (ref 3.9–10.3)

## 2015-12-13 LAB — COMPREHENSIVE METABOLIC PANEL
ALBUMIN: 3.3 g/dL — AB (ref 3.5–5.0)
ALK PHOS: 80 U/L (ref 40–150)
ALT: 10 U/L (ref 0–55)
ANION GAP: 8 meq/L (ref 3–11)
AST: 21 U/L (ref 5–34)
BUN: 16.9 mg/dL (ref 7.0–26.0)
CALCIUM: 9.3 mg/dL (ref 8.4–10.4)
CO2: 23 mEq/L (ref 22–29)
Chloride: 108 mEq/L (ref 98–109)
Creatinine: 0.8 mg/dL (ref 0.6–1.1)
EGFR: 65 mL/min/{1.73_m2} — AB (ref >90)
Glucose: 66 mg/dl — ABNORMAL LOW (ref 70–140)
POTASSIUM: 4 meq/L (ref 3.5–5.1)
Sodium: 140 mEq/L (ref 136–145)
Total Bilirubin: 0.45 mg/dL (ref 0.20–1.20)
Total Protein: 7.2 g/dL (ref 6.4–8.3)

## 2015-12-13 MED ORDER — HEPARIN SOD (PORK) LOCK FLUSH 100 UNIT/ML IV SOLN
500.0000 [IU] | Freq: Once | INTRAVENOUS | Status: AC | PRN
  Administered 2015-12-13: 500 [IU]
  Filled 2015-12-13: qty 5

## 2015-12-13 MED ORDER — SODIUM CHLORIDE 0.9 % IV SOLN
400.0000 mg/m2 | Freq: Once | INTRAVENOUS | Status: AC
  Administered 2015-12-13: 675 mg via INTRAVENOUS
  Filled 2015-12-13: qty 22

## 2015-12-13 MED ORDER — SODIUM CHLORIDE 0.9 % IV SOLN
Freq: Once | INTRAVENOUS | Status: AC
  Administered 2015-12-13: 12:00:00 via INTRAVENOUS
  Filled 2015-12-13: qty 8

## 2015-12-13 MED ORDER — SODIUM CHLORIDE 0.9 % IV SOLN
Freq: Once | INTRAVENOUS | Status: AC
  Administered 2015-12-13: 11:00:00 via INTRAVENOUS

## 2015-12-13 MED ORDER — SODIUM CHLORIDE 0.9 % IJ SOLN
10.0000 mL | INTRAMUSCULAR | Status: DC | PRN
  Administered 2015-12-13: 10 mL
  Filled 2015-12-13: qty 10

## 2015-12-13 NOTE — Progress Notes (Signed)
Per Geannie Risen, NP, ok to treat with ANC of 1.1.

## 2015-12-13 NOTE — Telephone Encounter (Signed)
Gv pt appts for Aug + Sept 2017.

## 2015-12-13 NOTE — Progress Notes (Signed)
[place] [phone]   Fax:([phone]) [place], MD [place] [address]  Principle Diagnosis and stage: Recurrent non-small cell lung cancer, initially diagnosed a stage IB (T2, N0, M0) adenocarcinoma in June 2008 with a tumor size of 8 cm.   Prior Therapy:  #1 status post left upper lobectomy with lymph node dissection under the care of Dr. Arlyce Dice on 06/29/2006.  #2 status post 4 cycles of adjuvant chemotherapy with cisplatin and Taxotere last dose given 08/01/2006.  #3 status post 6 cycles of systemic chemotherapy with carboplatin and Alimta for disease recurrence last dose given 12/12/2009.  #4 palliative radiotherapy to the enlarging right upper lobe lung mass under the care of Dr. Pablo Ledger completed on 11/14/2015.  Current therapy: Maintenance chemotherapy with Alimta at 500 mg per meter squared given every 3 weeks status post 85 cycles. Patient received Alimta 400 mg per meter squared with her most recent chemotherapy 3 weeks ago.  CHEMOTHERAPY INTENT: Palliative/maintenance.  CURRENT # OF CHEMOTHERAPY CYCLES: 86  CURRENT ANTIEMETICS: Zofran, dexamethasone and Compazine  CURRENT SMOKING STATUS: Never Smoker.   ORAL CHEMOTHERAPY AND CONSENT: None   CURRENT BISPHOSPHONATES USE: None  PAIN MANAGEMENT: None  NARCOTICS INDUCED CONSTIPATION: None  LIVING WILL AND CODE STATUS: has living will for no CODE BLUE.  INTERVAL HISTORY: [name] 82 y.o. female returns to the clinic today for follow up visit. The patient has no complaints today except for mild fatigue. She is tolerating her maintenance chemotherapy with single agent Alimta fairly well. She completed palliative radiotherapy to the enlarging right upper lobe lung mass under the care of Dr. Pablo Ledger on 11/14/15. She tolerated it well except for rash and dry skin. The patient denied having any significant nausea or vomiting. She  has no fever or chills. The patient denied having any significant chest pain, shortness of breath, or hemoptysis. She has no weight loss or night sweats. She is here today to start cycle # 8 of her treatment.   MEDICAL HISTORY: Past Medical History  Diagnosis Date  . History of migraine headaches   . Hypercholesterolemia   . lung ca dx'd 06/2006    rt and lt lung  . Lung cancer ([place]) 3/14/1    right- Adenocarcinoma w/bronchioalveolar features  . FHx: chemotherapy 2008&2011    4 cycles cisplatin,taxotere,2008& carboplatin and Alimta 2011    ALLERGIES:  is allergic to simvastatin and iodinated diagnostic agents.  MEDICATIONS:  Current Outpatient Prescriptions  Medication Sig Dispense Refill  . Calcium Carbonate-Vit D-Min (CALTRATE PLUS PO) Take 500 mg by mouth 2 (two) times daily. Reported on 2/70/3500    . folic acid (FOLVITE) 1 MG tablet Take 1 mg by mouth daily. Reported on 12/13/2015    . Multiple Vitamins-Minerals (CENTRUM SILVER PO) Take 1 tablet by mouth daily. Reported on 12/13/2015    . vitamin B-12 (CYANOCOBALAMIN) 500 MCG tablet Take 1,500 mcg by mouth daily. Reported on 12/13/2015    . chlorpheniramine-HYDROcodone (TUSSIONEX) 10-8 MG/5ML SUER Take 5 mLs by mouth 2 (two) times daily. (Patient not taking: Reported on 12/13/2015) 140 mL 0  . emollient (RADIAGEL) gel Apply topically as needed for wound care. Reported on 12/13/2015    . hydrocortisone 2.5 % cream Reported on 12/13/2015    . potassium gluconate 595 MG TABS Take 595 mg by mouth daily. Reported on 12/13/2015    . prochlorperazine (COMPAZINE) 10 MG tablet Take 1 tablet (10  mg total) by mouth every 6 (six) hours as needed. (Patient not taking: Reported on 11/06/2015) 15 tablet 1  . Wound Dressings (SONAFINE) Apply 1 application topically 2 (two) times daily. Reported on 12/13/2015     No current facility-administered medications for this visit.   Facility-Administered Medications Ordered in Other Visits  Medication Dose Route  Frequency Provider Last Rate Last Dose  . sodium chloride 0.9 % injection 10 mL  10 mL Intracatheter PRN Curt Bears, MD   10 mL at 12/13/15 1215    REVIEW OF SYSTEMS:  A comprehensive review of systems was negative except for: Constitutional: positive for fatigue   PHYSICAL EXAMINATION: General appearance: alert, cooperative and no distress Head: Normocephalic, without obvious abnormality, atraumatic Neck: no adenopathy Lymph nodes: Cervical, supraclavicular, and axillary nodes normal. Resp: clear to auscultation bilaterally Back: symmetric, no curvature. ROM normal. No CVA tenderness. Cardio: regular rate and rhythm, S1, S2 normal, no murmur, click, rub or gallop GI: soft, non-tender; bowel sounds normal; no masses,  no organomegaly Extremities: extremities normal, atraumatic, no cyanosis or edema Neurologic: Alert and oriented X 3, normal strength and tone. Normal symmetric reflexes. Normal coordination and gait  ECOG PERFORMANCE STATUS: 1 - Symptomatic but completely ambulatory  Blood pressure 126/68, pulse 69, temperature 98.1 F (36.7 C), temperature source Oral, resp. rate 18, height '5\' 2"'$  (1.575 m), weight 140 lb 14.4 oz (63.912 kg), SpO2 100 %.  LABORATORY DATA: Lab Results  Component Value Date   WBC 2.5* 12/13/2015   HGB 12.5 12/13/2015   HCT 38.1 12/13/2015   MCV 101.0 12/13/2015   PLT 225 12/13/2015      Chemistry      Component Value Date/Time   NA 140 12/13/2015 0950   NA 139 01/24/2012 1008   NA 140 08/21/2011 1306   K 4.0 12/13/2015 0950   K 4.2 01/24/2012 1008   K 4.1 08/21/2011 1306   CL 108* 11/04/2012 0947   CL 103 01/24/2012 1008   CL 107 08/21/2011 1306   CO2 23 12/13/2015 0950   CO2 28 01/24/2012 1008   CO2 22 08/21/2011 1306   BUN 16.9 12/13/2015 0950   BUN 13 01/24/2012 1008   BUN 11 08/21/2011 1306   CREATININE 0.8 12/13/2015 0950   CREATININE 0.8 01/24/2012 1008   CREATININE 0.81 08/21/2011 1306      Component Value Date/Time    CALCIUM 9.3 12/13/2015 0950   CALCIUM 8.6 01/24/2012 1008   CALCIUM 8.8 08/21/2011 1306   ALKPHOS 80 12/13/2015 0950   ALKPHOS 79 01/24/2012 1008   ALKPHOS 87 08/21/2011 1306   AST 21 12/13/2015 0950   AST 24 01/24/2012 1008   AST 17 08/21/2011 1306   ALT 10 12/13/2015 0950   ALT 21 01/24/2012 1008   ALT 8 08/21/2011 1306   BILITOT 0.45 12/13/2015 0950   BILITOT 0.90 01/24/2012 1008   BILITOT 0.6 08/21/2011 1306       RADIOGRAPHIC STUDIES: No results found. ASSESSMENT AND PLAN: This is a very pleasant 82 year old white female with recurrent non-small cell lung cancer, adenocarcinoma. The patient is currently undergoing maintenance chemotherapy with single agent Alimta status post 85 cycles and she is tolerating it fairly well. She is also status post palliative radiotherapy to the enlarging right upper lobe lung mass.  Counts reviewed with Dr. Alen Blew in Dr. Worthy Flank absence. Will proceed with cycle #86 of maintenance Alimta with ANC of 1.1. Noted that most recent Alimta dose was 400 mg per meter squared  and will proceed with this same dose today.   We will see her back for follow-up visit in 3 weeks for evaluation before starting cycle #87.  For the chemotherapy-induced leukocytopenia, we will monitor this closely and consider the patient for growth factors needed. She was advised to call immediately if she has any concerning symptoms in the interval especially any fever or chills.   Mikey Bussing, NP 12/13/2015

## 2015-12-13 NOTE — Patient Instructions (Signed)
Pemetrexed injection What is this medicine? PEMETREXED (PEM e TREX ed) is a chemotherapy drug. This medicine affects cells that are rapidly growing, such as cancer cells and cells in your mouth and stomach. It is usually used to treat lung cancers like non-small cell lung cancer and mesothelioma. It may also be used to treat other cancers. This medicine may be used for other purposes; ask your health care provider or pharmacist if you have questions. What should I tell my health care provider before I take this medicine? They need to know if you have any of these conditions: -if you frequently drink alcohol containing beverages -infection (especially a virus infection such as chickenpox, cold sores, or herpes) -kidney disease -liver disease -low blood counts, like low platelets, red bloods, or white blood cells -an unusual or allergic reaction to pemetrexed, mannitol, other medicines, foods, dyes, or preservatives -pregnant or trying to get pregnant -breast-feeding How should I use this medicine? This drug is given as an infusion into a vein. It is administered in a hospital or clinic by a specially trained health care professional. Talk to your pediatrician regarding the use of this medicine in children. Special care may be needed. Overdosage: If you think you have taken too much of this medicine contact a poison control center or emergency room at once. NOTE: This medicine is only for you. Do not share this medicine with others. What if I miss a dose? It is important not to miss your dose. Call your doctor or health care professional if you are unable to keep an appointment. What may interact with this medicine? -aspirin and aspirin-like medicines -medicines to increase blood counts like filgrastim, pegfilgrastim, sargramostim -methotrexate -NSAIDS, medicines for pain and inflammation, like ibuprofen or naproxen -probenecid -pyrimethamine -vaccines Talk to your doctor or health care  professional before taking any of these medicines: -acetaminophen -aspirin -ibuprofen -ketoprofen -naproxen This list may not describe all possible interactions. Give your health care provider a list of all the medicines, herbs, non-prescription drugs, or dietary supplements you use. Also tell them if you smoke, drink alcohol, or use illegal drugs. Some items may interact with your medicine. What should I watch for while using this medicine? Visit your doctor for checks on your progress. This drug may make you feel generally unwell. This is not uncommon, as chemotherapy can affect healthy cells as well as cancer cells. Report any side effects. Continue your course of treatment even though you feel ill unless your doctor tells you to stop. In some cases, you may be given additional medicines to help with side effects. Follow all directions for their use. Call your doctor or health care professional for advice if you get a fever, chills or sore throat, or other symptoms of a cold or flu. Do not treat yourself. This drug decreases your body's ability to fight infections. Try to avoid being around people who are sick. This medicine may increase your risk to bruise or bleed. Call your doctor or health care professional if you notice any unusual bleeding. Be careful brushing and flossing your teeth or using a toothpick because you may get an infection or bleed more easily. If you have any dental work done, tell your dentist you are receiving this medicine. Avoid taking products that contain aspirin, acetaminophen, ibuprofen, naproxen, or ketoprofen unless instructed by your doctor. These medicines may hide a fever. Call your doctor or health care professional if you get diarrhea or mouth sores. Do not treat yourself. To protect your  kidneys, drink water or other fluids as directed while you are taking this medicine. Men and women must use effective birth control while taking this medicine. You may also  need to continue using effective birth control for a time after stopping this medicine. Do not become pregnant while taking this medicine. Tell your doctor right away if you think that you or your partner might be pregnant. There is a potential for serious side effects to an unborn child. Talk to your health care professional or pharmacist for more information. Do not breast-feed an infant while taking this medicine. This medicine may lower sperm counts. What side effects may I notice from receiving this medicine? Side effects that you should report to your doctor or health care professional as soon as possible: -allergic reactions like skin rash, itching or hives, swelling of the face, lips, or tongue -low blood counts - this medicine may decrease the number of white blood cells, red blood cells and platelets. You may be at increased risk for infections and bleeding. -signs of infection - fever or chills, cough, sore throat, pain or difficulty passing urine -signs of decreased platelets or bleeding - bruising, pinpoint red spots on the skin, black, tarry stools, blood in the urine -signs of decreased red blood cells - unusually weak or tired, fainting spells, lightheadedness -breathing problems, like a dry cough -changes in emotions or moods -chest pain -confusion -diarrhea -high blood pressure -mouth or throat sores or ulcers -pain, swelling, warmth in the leg -pain on swallowing -swelling of the ankles, feet, hands -trouble passing urine or change in the amount of urine -vomiting -yellowing of the eyes or skin Side effects that usually do not require medical attention (report to your doctor or health care professional if they continue or are bothersome): -hair loss -loss of appetite -nausea -stomach upset This list may not describe all possible side effects. Call your doctor for medical advice about side effects. You may report side effects to FDA at 1-800-FDA-1088. Where should I keep  my medicine? This drug is given in a hospital or clinic and will not be stored at home. NOTE: This sheet is a summary. It may not cover all possible information. If you have questions about this medicine, talk to your doctor, pharmacist, or health care provider.    2016, Elsevier/Gold Standard. (2007-12-22 13:24:03)

## 2015-12-22 ENCOUNTER — Ambulatory Visit: Payer: Self-pay | Admitting: Radiation Oncology

## 2015-12-23 NOTE — Progress Notes (Signed)
  Radiation Oncology         (336) [phone] ________________________________  Name: JATON EILERS MRN: [id number]  Date: 11/14/2015  DOB: [birth year]  End of Treatment Note  Diagnosis:   Bilateral lung cancer ([place])   Staging form: Lung, AJCC 7th Edition     Clinical: Stage IB (T2a, N0, M0, Free text: Recurrent non-small cell lung cancer initially diagnosed as stage Ib) - Signed by Curt Bears, MD on 11/17/2013   Indication for treatment::  palliative       Radiation treatment dates:11/01/2015 through 11/14/2015  Site/dose: right upper lobe tumor was treated to a dose of 30 gray in 10 fractions at 3 gray per fraction using a 3-D conformal technique.  Narrative: The patiencompleted her course of radiation treatment without any significant delay.  Plan: The patient has completed radiation treatment. The patient will return to radiation oncology clinic for routine followup in one month. I advised the patient to call or return sooner if they have any questions or concerns related to their recovery or treatment. ________________________________  Jodelle Gross, M.D., Ph.D.

## 2015-12-27 ENCOUNTER — Encounter: Payer: Self-pay | Admitting: Oncology

## 2015-12-28 ENCOUNTER — Ambulatory Visit
Admission: RE | Admit: 2015-12-28 | Discharge: 2015-12-28 | Disposition: A | Discharge status: Home / Self Care | Payer: Medicare Other | Source: Ambulatory Visit | Attending: Radiation Oncology | Admitting: Radiation Oncology

## 2015-12-28 ENCOUNTER — Encounter: Payer: Self-pay | Admitting: Radiation Oncology

## 2015-12-28 DIAGNOSIS — Y842 Radiological procedure and radiotherapy as the cause of abnormal reaction of the patient, or of later complication, without mention of misadventure at the time of the procedure: Secondary | ICD-10-CM | POA: Insufficient documentation

## 2015-12-28 DIAGNOSIS — C3492 Malignant neoplasm of unspecified part of left bronchus or lung: Secondary | ICD-10-CM | POA: Diagnosis present

## 2015-12-28 DIAGNOSIS — C3491 Malignant neoplasm of unspecified part of right bronchus or lung: Secondary | ICD-10-CM | POA: Diagnosis not present

## 2015-12-28 NOTE — Progress Notes (Signed)
Weight changes, if any:  Wt Readings from Last 3 Encounters:  12/28/15 141 lb 6.4 oz (64.1 kg)  12/13/15 140 lb 14.4 oz (63.9 kg)  11/22/15 141 lb 8 oz (64.2 kg)   Respiratory complaints, if any: Coughing up clear loose secretion at times mostly at night, denies SOB and wheezing Hemoptysis, if any: None  Swallowing Problems/Pain/Difficulty swallowing:None Smoking Tobacco/Marijuana/Snuff/ETOH YQM:GNOI never a smoker exposure to second hand smoke Skin: Skin normal to back Pain : No Appetite:Good Fatigue:Having fatigue after chemotherapy  When is next chemo scheduled?:January 03, 2016 Alimta every three weeks Lab work from of chart:12-13-15 CBC and CMET BP 136/84   Pulse 92   Temp 97.9 F (36.6 C) (Oral)   Resp 18   Ht '5\' 2"'$  (1.575 m)   Wt 141 lb 6.4 oz (64.1 kg)   SpO2 100%   BMI 25.86 kg/m

## 2015-12-28 NOTE — Progress Notes (Signed)
Radiation Oncology         (336) [phone] ________________________________  Name: [name] MRN: [id number]  Date: 12/28/2015  DOB: 1932/10/15  Follow-Up Visit Note  CC: [name], MD  Curt Bears, MD    Diagnosis: Bilateral lung cancer ([place])   Staging form: Lung, AJCC 7th Edition     Clinical: Stage IB (T2a, N0, M0, Free text: Recurrent non-small cell lung cancer initially diagnosed as stage Ib) - Signed by Curt Bears, MD on 11/17/2013   Interval Since Last Radiation:  One month, one week (11/14/2015)   Narrative:  The patient returns today for routine follow-up. Patient reports coughing up a clear loose secretion at night, mostly at night.  She denies wheezing and hematopoesis  The patient denies swallowing problems. Patient reports a 'bad cough' but is taking mediation prescribed by Dr. Julien Nordmann to treat this.  The patient also says she has some 'rattling' in her chest at night.                            ALLERGIES:  is allergic to simvastatin and iodinated diagnostic agents.  Meds: Current Outpatient Prescriptions  Medication Sig Dispense Refill  . Calcium Carbonate-Vit D-Min (CALTRATE PLUS PO) Take 500 mg by mouth 2 (two) times daily. Reported on 12/13/2015    . chlorpheniramine-HYDROcodone (TUSSIONEX) 10-8 MG/5ML SUER Take 5 mLs by mouth 2 (two) times daily. (Patient not taking: Reported on 12/13/2015) 140 mL 0  . emollient (RADIAGEL) gel Apply topically as needed for wound care. Reported on 2/70/3500    . folic acid (FOLVITE) 1 MG tablet Take 1 mg by mouth daily. Reported on 12/13/2015    . hydrocortisone 2.5 % cream Reported on 12/13/2015    . Multiple Vitamins-Minerals (CENTRUM SILVER PO) Take 1 tablet by mouth daily. Reported on 12/13/2015    . potassium gluconate 595 MG TABS Take 595 mg by mouth daily. Reported on 12/13/2015    . prochlorperazine (COMPAZINE) 10 MG tablet Take 1 tablet (10 mg total) by mouth every 6 (six) hours as needed. (Patient not taking:  Reported on 11/06/2015) 15 tablet 1  . vitamin B-12 (CYANOCOBALAMIN) 500 MCG tablet Take 1,500 mcg by mouth daily. Reported on 12/13/2015    . Wound Dressings (SONAFINE) Apply 1 application topically 2 (two) times daily. Reported on 12/13/2015     No current facility-administered medications for this encounter.     Physical Findings: The patient is in no acute distress. Patient is alert and oriented.  height is '5\' 2"'$  (1.575 m) and weight is 141 lb 6.4 oz (64.1 kg). Her oral temperature is 97.9 F (36.6 C). Her blood pressure is 136/84 and her pulse is 92. Her respiration is 18 and oxygen saturation is 100%. .  Lungs are clear to auscultation bilaterally. Heart has regular rate and rhythm. No palpable cervical, supraclavicular, or axillary adenopathy. Abdomen soft, non-tender, normal bowel sounds.   Lab Findings: Lab Results  Component Value Date   WBC 2.5 (L) 12/13/2015   HGB 12.5 12/13/2015   HCT 38.1 12/13/2015   MCV 101.0 12/13/2015   PLT 225 12/13/2015    Radiographic Findings: No results found.  Impression:  The patient is recovering from the effects of radiation.    Plan:  The patient has a CT scan scheduled in the following months with medical oncology with Dr. Julien Nordmann.  I will release the patient to the care of Dr. Julien Nordmann.  ____________________________________  -----------------------------------  Jeneen Rinks  Jabier Gauss, PhD, MD  This document serves as a record of services personally performed by Gery Pray, MD. It was created on his behalf by Truddie Hidden, a trained medical scribe. The creation of this record is based on the scribe's personal observations and the provider's statements to them. This document has been checked and approved by the attending provider.

## 2016-01-02 ENCOUNTER — Encounter: Payer: Self-pay | Admitting: Pharmacist

## 2016-01-03 ENCOUNTER — Ambulatory Visit (HOSPITAL_BASED_OUTPATIENT_CLINIC_OR_DEPARTMENT_OTHER): Payer: Medicare Other

## 2016-01-03 ENCOUNTER — Telehealth: Payer: Self-pay | Admitting: Internal Medicine

## 2016-01-03 ENCOUNTER — Other Ambulatory Visit (HOSPITAL_BASED_OUTPATIENT_CLINIC_OR_DEPARTMENT_OTHER): Payer: Medicare Other

## 2016-01-03 ENCOUNTER — Encounter: Payer: Self-pay | Admitting: Internal Medicine

## 2016-01-03 ENCOUNTER — Ambulatory Visit (HOSPITAL_BASED_OUTPATIENT_CLINIC_OR_DEPARTMENT_OTHER): Payer: Medicare Other | Admitting: Internal Medicine

## 2016-01-03 VITALS — BP 117/68 | HR 87 | Temp 98.3°F | Resp 18 | Ht 62.0 in | Wt 140.3 lb

## 2016-01-03 DIAGNOSIS — C7801 Secondary malignant neoplasm of right lung: Secondary | ICD-10-CM

## 2016-01-03 DIAGNOSIS — C3412 Malignant neoplasm of upper lobe, left bronchus or lung: Secondary | ICD-10-CM

## 2016-01-03 DIAGNOSIS — C3492 Malignant neoplasm of unspecified part of left bronchus or lung: Principal | ICD-10-CM

## 2016-01-03 DIAGNOSIS — C3491 Malignant neoplasm of unspecified part of right bronchus or lung: Secondary | ICD-10-CM

## 2016-01-03 DIAGNOSIS — Z5111 Encounter for antineoplastic chemotherapy: Secondary | ICD-10-CM

## 2016-01-03 DIAGNOSIS — D72818 Other decreased white blood cell count: Secondary | ICD-10-CM | POA: Diagnosis not present

## 2016-01-03 LAB — COMPREHENSIVE METABOLIC PANEL
ALT: 9 U/L (ref 0–55)
AST: 21 U/L (ref 5–34)
Albumin: 3.1 g/dL — ABNORMAL LOW (ref 3.5–5.0)
Alkaline Phosphatase: 80 U/L (ref 40–150)
Anion Gap: 8 mEq/L (ref 3–11)
BUN: 14.2 mg/dL (ref 7.0–26.0)
CHLORIDE: 108 meq/L (ref 98–109)
CO2: 24 meq/L (ref 22–29)
CREATININE: 0.9 mg/dL (ref 0.6–1.1)
Calcium: 9.3 mg/dL (ref 8.4–10.4)
EGFR: 64 mL/min/{1.73_m2} — ABNORMAL LOW (ref >90)
Glucose: 68 mg/dl — ABNORMAL LOW (ref 70–140)
Potassium: 4 mEq/L (ref 3.5–5.1)
Sodium: 140 mEq/L (ref 136–145)
Total Bilirubin: 0.61 mg/dL (ref 0.20–1.20)
Total Protein: 7.2 g/dL (ref 6.4–8.3)

## 2016-01-03 LAB — CBC WITH DIFFERENTIAL/PLATELET
BASO%: 0.5 % (ref 0.0–2.0)
BASOS ABS: 0 10*3/uL (ref 0.0–0.1)
EOS ABS: 0.2 10*3/uL (ref 0.0–0.5)
EOS%: 4 % (ref 0.0–7.0)
HCT: 36.3 % (ref 34.8–46.6)
HGB: 12.1 g/dL (ref 11.6–15.9)
LYMPH%: 20.3 % (ref 14.0–49.7)
MCH: 33.1 pg (ref 25.1–34.0)
MCHC: 33.3 g/dL (ref 31.5–36.0)
MCV: 99.2 fL (ref 79.5–101.0)
MONO#: 0.6 10*3/uL (ref 0.1–0.9)
MONO%: 16.6 % — ABNORMAL HIGH (ref 0.0–14.0)
NEUT#: 2.2 10*3/uL (ref 1.5–6.5)
NEUT%: 58.6 % (ref 38.4–76.8)
Platelets: 259 10*3/uL (ref 145–400)
RBC: 3.66 10*6/uL — AB (ref 3.70–5.45)
RDW: 14.2 % (ref 11.2–14.5)
WBC: 3.8 10*3/uL — AB (ref 3.9–10.3)
lymph#: 0.8 10*3/uL — ABNORMAL LOW (ref 0.9–3.3)

## 2016-01-03 MED ORDER — CYANOCOBALAMIN 1000 MCG/ML IJ SOLN
1000.0000 ug | Freq: Once | INTRAMUSCULAR | Status: AC
  Administered 2016-01-03: 1000 ug via INTRAMUSCULAR

## 2016-01-03 MED ORDER — CYANOCOBALAMIN 1000 MCG/ML IJ SOLN
INTRAMUSCULAR | Status: AC
  Filled 2016-01-03: qty 1

## 2016-01-03 MED ORDER — SODIUM CHLORIDE 0.9 % IV SOLN
400.0000 mg/m2 | Freq: Once | INTRAVENOUS | Status: AC
  Administered 2016-01-03: 675 mg via INTRAVENOUS
  Filled 2016-01-03: qty 7

## 2016-01-03 MED ORDER — SODIUM CHLORIDE 0.9 % IJ SOLN
10.0000 mL | INTRAMUSCULAR | Status: DC | PRN
  Administered 2016-01-03: 10 mL
  Filled 2016-01-03: qty 10

## 2016-01-03 MED ORDER — SODIUM CHLORIDE 0.9 % IV SOLN
Freq: Once | INTRAVENOUS | Status: AC
  Administered 2016-01-03: 10:00:00 via INTRAVENOUS

## 2016-01-03 MED ORDER — HEPARIN SOD (PORK) LOCK FLUSH 100 UNIT/ML IV SOLN
500.0000 [IU] | Freq: Once | INTRAVENOUS | Status: AC | PRN
  Administered 2016-01-03: 500 [IU]
  Filled 2016-01-03: qty 5

## 2016-01-03 MED ORDER — SODIUM CHLORIDE 0.9 % IV SOLN
Freq: Once | INTRAVENOUS | Status: AC
  Administered 2016-01-03: 11:00:00 via INTRAVENOUS
  Filled 2016-01-03: qty 8

## 2016-01-03 NOTE — Patient Instructions (Signed)
[facility] Discharge Instructions for Patients Receiving Chemotherapy  Today you received the following chemotherapy agents Alimta.  To help prevent nausea and vomiting after your treatment, we encourage you to take your nausea medication as prescribed.   If you develop nausea and vomiting that is not controlled by your nausea medication, call the clinic.   BELOW ARE SYMPTOMS THAT SHOULD BE REPORTED IMMEDIATELY:  *FEVER GREATER THAN 100.5 F  *CHILLS WITH OR WITHOUT FEVER  NAUSEA AND VOMITING THAT IS NOT CONTROLLED WITH YOUR NAUSEA MEDICATION  *UNUSUAL SHORTNESS OF BREATH  *UNUSUAL BRUISING OR BLEEDING  TENDERNESS IN MOUTH AND THROAT WITH OR WITHOUT PRESENCE OF ULCERS  *URINARY PROBLEMS  *BOWEL PROBLEMS  UNUSUAL RASH Items with * indicate a potential emergency and should be followed up as soon as possible.  Feel free to call the clinic you have any questions or concerns. The clinic phone number is [phone].  Please show the CHEMO ALERT CARD at check-in to the Emergency Department and triage nurse.   

## 2016-01-03 NOTE — Progress Notes (Signed)
[facility] [phone]   Fax:(336) [place], MD [place] [address]  Principle Diagnosis and stage: Recurrent non-small cell lung cancer, initially diagnosed a stage IB (T2, N0, M0) adenocarcinoma in June 2008 with a tumor size of 8 cm.   Prior Therapy:  #1 status post left upper lobectomy with lymph node dissection under the care of Dr. Arlyce Dice on 06/29/2006.  #2 status post 4 cycles of adjuvant chemotherapy with cisplatin and Taxotere last dose given 08/01/2006.  #3 status post 6 cycles of systemic chemotherapy with carboplatin and Alimta for disease recurrence last dose given 12/12/2009.  #4 palliative radiotherapy to the enlarging right upper lobe lung mass under the care of Dr. Pablo Ledger completed on 11/14/2015.  Current therapy: Maintenance chemotherapy with Alimta at 500 mg per meter squared given every 3 weeks status post 86 cycles.  CHEMOTHERAPY INTENT: Palliative/maintenance.  CURRENT # OF CHEMOTHERAPY CYCLES: 87  CURRENT ANTIEMETICS: Zofran, dexamethasone and Compazine  CURRENT SMOKING STATUS: Never Smoker.   ORAL CHEMOTHERAPY AND CONSENT: None   CURRENT BISPHOSPHONATES USE: None  PAIN MANAGEMENT: None  NARCOTICS INDUCED CONSTIPATION: None  LIVING WILL AND CODE STATUS: has living will for no CODE BLUE.  INTERVAL HISTORY: MYSTI HALEY 82 y.o. female returns to the clinic today for follow up visit. The patient has no complaints today except for mild fatigue and cough productive of whitish sputum. She is tolerating her maintenance chemotherapy with single agent Alimta fairly well. She status post 86 cycles. The patient denied having any significant nausea or vomiting. She has no fever or chills. The patient denied having any significant chest pain, shortness of breath, or hemoptysis. She has no weight loss or night sweats. She is here today to start cycle # 51 of her  treatment.   MEDICAL HISTORY: Past Medical History:  Diagnosis Date  . FHx: chemotherapy 2008&2011   4 cycles cisplatin,taxotere,2008& carboplatin and Alimta 2011  . History of migraine headaches   . Hypercholesterolemia   . lung ca dx'd 06/2006   rt and lt lung  . Lung cancer ([facility]) 3/14/1   right- Adenocarcinoma w/bronchioalveolar features  . Radiation 11/01/15-11/14/15   right upper lobe 30 gray    ALLERGIES:  is allergic to simvastatin and iodinated diagnostic agents.  MEDICATIONS:  Current Outpatient Prescriptions  Medication Sig Dispense Refill  . Calcium Carbonate-Vit D-Min (CALTRATE PLUS PO) Take 500 mg by mouth 2 (two) times daily. Reported on 12/13/2015    . chlorpheniramine-HYDROcodone (TUSSIONEX) 10-8 MG/5ML SUER Take 5 mLs by mouth 2 (two) times daily. (Patient not taking: Reported on 12/13/2015) 140 mL 0  . emollient (RADIAGEL) gel Apply topically as needed for wound care. Reported on 0/24/0973    . folic acid (FOLVITE) 1 MG tablet Take 1 mg by mouth daily. Reported on 12/13/2015    . hydrocortisone 2.5 % cream Reported on 12/13/2015    . Multiple Vitamins-Minerals (CENTRUM SILVER PO) Take 1 tablet by mouth daily. Reported on 12/13/2015    . potassium gluconate 595 MG TABS Take 595 mg by mouth daily. Reported on 12/13/2015    . prochlorperazine (COMPAZINE) 10 MG tablet Take 1 tablet (10 mg total) by mouth every 6 (six) hours as needed. (Patient not taking: Reported on 11/06/2015) 15 tablet 1  . vitamin B-12 (CYANOCOBALAMIN) 500 MCG tablet Take 1,500 mcg by mouth daily. Reported on 12/13/2015    . Wound Dressings (SONAFINE) Apply 1 application  topically 2 (two) times daily. Reported on 12/13/2015     No current facility-administered medications for this visit.     REVIEW OF SYSTEMS:  A comprehensive review of systems was negative except for: Constitutional: positive for fatigue Respiratory: positive for cough and sputum   PHYSICAL EXAMINATION: General appearance: alert,  cooperative and no distress Head: Normocephalic, without obvious abnormality, atraumatic Neck: no adenopathy Lymph nodes: Cervical, supraclavicular, and axillary nodes normal. Resp: clear to auscultation bilaterally Back: symmetric, no curvature. ROM normal. No CVA tenderness. Cardio: regular rate and rhythm, S1, S2 normal, no murmur, click, rub or gallop GI: soft, non-tender; bowel sounds normal; no masses,  no organomegaly Extremities: extremities normal, atraumatic, no cyanosis or edema Neurologic: Alert and oriented X 3, normal strength and tone. Normal symmetric reflexes. Normal coordination and gait  ECOG PERFORMANCE STATUS: 1 - Symptomatic but completely ambulatory  Blood pressure 117/68, pulse 87, temperature 98.3 F (36.8 C), temperature source Oral, resp. rate 18, height '5\' 2"'$  (1.575 m), weight 140 lb 4.8 oz (63.6 kg), SpO2 100 %.  LABORATORY DATA: Lab Results  Component Value Date   WBC 3.8 (L) 01/03/2016   HGB 12.1 01/03/2016   HCT 36.3 01/03/2016   MCV 99.2 01/03/2016   PLT 259 01/03/2016      Chemistry      Component Value Date/Time   NA 140 12/13/2015 0950   K 4.0 12/13/2015 0950   CL 108 (H) 11/04/2012 0947   CO2 23 12/13/2015 0950   BUN 16.9 12/13/2015 0950   CREATININE 0.8 12/13/2015 0950      Component Value Date/Time   CALCIUM 9.3 12/13/2015 0950   ALKPHOS 80 12/13/2015 0950   AST 21 12/13/2015 0950   ALT 10 12/13/2015 0950   BILITOT 0.45 12/13/2015 0950       RADIOGRAPHIC STUDIES: No results found. ASSESSMENT AND PLAN: This is a very pleasant 82 years old white female with recurrent non-small cell lung cancer, adenocarcinoma. The patient is currently undergoing maintenance chemotherapy with single agent Alimta status post 86 cycles and she is tolerating it fairly well. She is also status post palliative radiotherapy to the enlarging right upper lobe lung mass. I recommended for her to continue her treatment with maintenance Alimta.  She will  proceed with cycle #87 today as a scheduled. I would see her back for follow-up visit in 3 weeks for evaluation before starting cycle #88 after repeating CT scan of the chest for restaging of her disease.. For the chemotherapy-induced leukocytopenia, we will monitor this closely and consider the patient for growth factors if needed. She was advised to call immediately if she has any concerning symptoms in the interval especially any fever or chills.  Disclaimer: This note was dictated with voice recognition software. Similar sounding words can inadvertently be transcribed and may not be corrected upon review.  [name]., MD 01/03/16

## 2016-01-03 NOTE — Telephone Encounter (Signed)
Gave pt cal & avs °

## 2016-01-11 ENCOUNTER — Ambulatory Visit: Payer: Self-pay | Admitting: Radiation Oncology

## 2016-01-19 ENCOUNTER — Other Ambulatory Visit: Payer: Self-pay | Admitting: Medical Oncology

## 2016-01-19 DIAGNOSIS — C3491 Malignant neoplasm of unspecified part of right bronchus or lung: Secondary | ICD-10-CM

## 2016-01-19 DIAGNOSIS — C3492 Malignant neoplasm of unspecified part of left bronchus or lung: Principal | ICD-10-CM

## 2016-01-20 ENCOUNTER — Other Ambulatory Visit: Payer: Self-pay | Admitting: Internal Medicine

## 2016-01-23 ENCOUNTER — Ambulatory Visit (HOSPITAL_COMMUNITY)
Admission: RE | Admit: 2016-01-23 | Discharge: 2016-01-23 | Disposition: A | Discharge status: Home / Self Care | Payer: Medicare Other | Source: Ambulatory Visit | Attending: Internal Medicine | Admitting: Internal Medicine

## 2016-01-23 DIAGNOSIS — J90 Pleural effusion, not elsewhere classified: Secondary | ICD-10-CM | POA: Insufficient documentation

## 2016-01-23 DIAGNOSIS — Z923 Personal history of irradiation: Secondary | ICD-10-CM | POA: Insufficient documentation

## 2016-01-23 DIAGNOSIS — I2510 Atherosclerotic heart disease of native coronary artery without angina pectoris: Secondary | ICD-10-CM | POA: Diagnosis not present

## 2016-01-23 DIAGNOSIS — C3491 Malignant neoplasm of unspecified part of right bronchus or lung: Secondary | ICD-10-CM

## 2016-01-23 DIAGNOSIS — I700 Atherosclerosis of aorta: Secondary | ICD-10-CM | POA: Diagnosis not present

## 2016-01-23 DIAGNOSIS — C3492 Malignant neoplasm of unspecified part of left bronchus or lung: Secondary | ICD-10-CM | POA: Insufficient documentation

## 2016-01-24 ENCOUNTER — Ambulatory Visit (HOSPITAL_BASED_OUTPATIENT_CLINIC_OR_DEPARTMENT_OTHER): Payer: Medicare Other

## 2016-01-24 ENCOUNTER — Encounter: Payer: Self-pay | Admitting: Internal Medicine

## 2016-01-24 ENCOUNTER — Other Ambulatory Visit (HOSPITAL_BASED_OUTPATIENT_CLINIC_OR_DEPARTMENT_OTHER): Payer: Medicare Other

## 2016-01-24 ENCOUNTER — Ambulatory Visit (HOSPITAL_BASED_OUTPATIENT_CLINIC_OR_DEPARTMENT_OTHER): Payer: Medicare Other | Admitting: Internal Medicine

## 2016-01-24 VITALS — BP 112/76

## 2016-01-24 DIAGNOSIS — C3411 Malignant neoplasm of upper lobe, right bronchus or lung: Secondary | ICD-10-CM | POA: Diagnosis not present

## 2016-01-24 DIAGNOSIS — J700 Acute pulmonary manifestations due to radiation: Secondary | ICD-10-CM | POA: Diagnosis not present

## 2016-01-24 DIAGNOSIS — C3491 Malignant neoplasm of unspecified part of right bronchus or lung: Secondary | ICD-10-CM

## 2016-01-24 DIAGNOSIS — C3492 Malignant neoplasm of unspecified part of left bronchus or lung: Principal | ICD-10-CM

## 2016-01-24 DIAGNOSIS — Z5111 Encounter for antineoplastic chemotherapy: Secondary | ICD-10-CM

## 2016-01-24 HISTORY — DX: Acute pulmonary manifestations due to radiation: J70.0

## 2016-01-24 LAB — COMPREHENSIVE METABOLIC PANEL
ANION GAP: 8 meq/L (ref 3–11)
AST: 20 U/L (ref 5–34)
Albumin: 2.7 g/dL — ABNORMAL LOW (ref 3.5–5.0)
Alkaline Phosphatase: 85 U/L (ref 40–150)
BILIRUBIN TOTAL: 0.48 mg/dL (ref 0.20–1.20)
BUN: 15.5 mg/dL (ref 7.0–26.0)
CO2: 22 mEq/L (ref 22–29)
CREATININE: 0.8 mg/dL (ref 0.6–1.1)
Calcium: 9.3 mg/dL (ref 8.4–10.4)
Chloride: 108 mEq/L (ref 98–109)
EGFR: 68 mL/min/{1.73_m2} — AB (ref >90)
Glucose: 77 mg/dl (ref 70–140)
Potassium: 3.8 mEq/L (ref 3.5–5.1)
SODIUM: 139 meq/L (ref 136–145)
Total Protein: 7.6 g/dL (ref 6.4–8.3)

## 2016-01-24 LAB — CBC WITH DIFFERENTIAL/PLATELET
BASO%: 0.2 % (ref 0.0–2.0)
Basophils Absolute: 0 10*3/uL (ref 0.0–0.1)
EOS%: 3 % (ref 0.0–7.0)
Eosinophils Absolute: 0.1 10*3/uL (ref 0.0–0.5)
HCT: 36.1 % (ref 34.8–46.6)
HGB: 11.9 g/dL (ref 11.6–15.9)
LYMPH%: 27.6 % (ref 14.0–49.7)
MCH: 32.1 pg (ref 25.1–34.0)
MCHC: 33 g/dL (ref 31.5–36.0)
MCV: 97.3 fL (ref 79.5–101.0)
MONO#: 0.6 10*3/uL (ref 0.1–0.9)
MONO%: 15.5 % — AB (ref 0.0–14.0)
NEUT#: 2.2 10*3/uL (ref 1.5–6.5)
NEUT%: 53.7 % (ref 38.4–76.8)
PLATELETS: 366 10*3/uL (ref 145–400)
RBC: 3.71 10*6/uL (ref 3.70–5.45)
RDW: 14 % (ref 11.2–14.5)
WBC: 4.1 10*3/uL (ref 3.9–10.3)
lymph#: 1.1 10*3/uL (ref 0.9–3.3)

## 2016-01-24 MED ORDER — SODIUM CHLORIDE 0.9 % IV SOLN
400.0000 mg/m2 | Freq: Once | INTRAVENOUS | Status: AC
  Administered 2016-01-24: 675 mg via INTRAVENOUS
  Filled 2016-01-24: qty 20

## 2016-01-24 MED ORDER — METHYLPREDNISOLONE 4 MG PO TBPK: ORAL_TABLET | ORAL | 0 refills | Status: DC

## 2016-01-24 MED ORDER — SODIUM CHLORIDE 0.9 % IV SOLN
Freq: Once | INTRAVENOUS | Status: AC
  Administered 2016-01-24: 10:00:00 via INTRAVENOUS
  Filled 2016-01-24: qty 8

## 2016-01-24 MED ORDER — SODIUM CHLORIDE 0.9 % IJ SOLN
10.0000 mL | INTRAMUSCULAR | Status: DC | PRN
  Administered 2016-01-24: 10 mL
  Filled 2016-01-24: qty 10

## 2016-01-24 MED ORDER — SODIUM CHLORIDE 0.9 % IV SOLN
Freq: Once | INTRAVENOUS | Status: AC
  Administered 2016-01-24: 10:00:00 via INTRAVENOUS

## 2016-01-24 MED ORDER — HEPARIN SOD (PORK) LOCK FLUSH 100 UNIT/ML IV SOLN
500.0000 [IU] | Freq: Once | INTRAVENOUS | Status: AC | PRN
  Administered 2016-01-24: 500 [IU]
  Filled 2016-01-24: qty 5

## 2016-01-24 MED ORDER — HYDROCOD POLST-CPM POLST ER 10-8 MG/5ML PO SUER: 5.0000 mL | Freq: Two times a day (BID) | ORAL | 0 refills | Status: DC

## 2016-01-24 NOTE — Patient Instructions (Signed)
[facility] Discharge Instructions for Patients Receiving Chemotherapy  Today you received the following chemotherapy agents: Avastin   To help prevent nausea and vomiting after your treatment, we encourage you to take your nausea medication as directed.    If you develop nausea and vomiting that is not controlled by your nausea medication, call the clinic.   BELOW ARE SYMPTOMS THAT SHOULD BE REPORTED IMMEDIATELY:  *FEVER GREATER THAN 100.5 F  *CHILLS WITH OR WITHOUT FEVER  NAUSEA AND VOMITING THAT IS NOT CONTROLLED WITH YOUR NAUSEA MEDICATION  *UNUSUAL SHORTNESS OF BREATH  *UNUSUAL BRUISING OR BLEEDING  TENDERNESS IN MOUTH AND THROAT WITH OR WITHOUT PRESENCE OF ULCERS  *URINARY PROBLEMS  *BOWEL PROBLEMS  UNUSUAL RASH Items with * indicate a potential emergency and should be followed up as soon as possible.  Feel free to call the clinic you have any questions or concerns. The clinic phone number is [phone].  Please show the [facility] at check-in to the Emergency Department and triage nurse.

## 2016-01-24 NOTE — Progress Notes (Signed)
[facility] Telephone:([phone]   Fax:(336) [place], MD [place] [address]  Principle Diagnosis and stage: Recurrent non-small cell lung cancer, initially diagnosed a stage IB (T2, N0, M0) adenocarcinoma in June 2008 with a tumor size of 8 cm.   Prior Therapy:  #1 status post left upper lobectomy with lymph node dissection under the care of Dr. Arlyce Dice on 06/29/2006.  #2 status post 4 cycles of adjuvant chemotherapy with cisplatin and Taxotere last dose given 08/01/2006.  #3 status post 6 cycles of systemic chemotherapy with carboplatin and Alimta for disease recurrence last dose given 12/12/2009.  #4 palliative radiotherapy to the enlarging right upper lobe lung mass under the care of Dr. Pablo Ledger completed on 11/14/2015.  Current therapy: Maintenance chemotherapy with Alimta at 500 mg per meter squared given every 3 weeks status post 87 cycles.  CHEMOTHERAPY INTENT: Palliative/maintenance.  CURRENT # OF CHEMOTHERAPY CYCLES: 88  CURRENT ANTIEMETICS: Zofran, dexamethasone and Compazine  CURRENT SMOKING STATUS: Never Smoker.   ORAL CHEMOTHERAPY AND CONSENT: None   CURRENT BISPHOSPHONATES USE: None  PAIN MANAGEMENT: None  NARCOTICS INDUCED CONSTIPATION: None  LIVING WILL AND CODE STATUS: has living will for no CODE BLUE.  INTERVAL HISTORY: [name] 82 y.o. female returns to the clinic today for follow up visit. The patient has no complaints today except for mild fatigue, cough as well as shortness of breath which was getting worse after the radiotherapy. She tolerated the last cycle of her maintenance chemotherapy with single agent Alimta fairly well. She status post 87 cycles. The patient denied having any significant nausea or vomiting. She has no fever or chills. The patient denied having any significant chest pain, shortness of breath, or hemoptysis. She has no weight loss or night  sweats. She had repeat CT scan of the chest performed recently and she is here for evaluation and discussion of her scan results.  MEDICAL HISTORY: Past Medical History:  Diagnosis Date  . FHx: chemotherapy 2008&2011   4 cycles cisplatin,taxotere,2008& carboplatin and Alimta 2011  . History of migraine headaches   . Hypercholesterolemia   . lung ca dx'd 06/2006   rt and lt lung  . Lung cancer ([facility]) 3/14/1   right- Adenocarcinoma w/bronchioalveolar features  . Radiation 11/01/15-11/14/15   right upper lobe 30 gray    ALLERGIES:  is allergic to simvastatin and iodinated diagnostic agents.  MEDICATIONS:  Current Outpatient Prescriptions  Medication Sig Dispense Refill  . Calcium Carbonate-Vit D-Min (CALTRATE PLUS PO) Take 500 mg by mouth 2 (two) times daily. Reported on 12/13/2015    . chlorpheniramine-HYDROcodone (TUSSIONEX) 10-8 MG/5ML SUER Take 5 mLs by mouth 2 (two) times daily. 140 mL 0  . emollient (RADIAGEL) gel Apply topically as needed for wound care. Reported on 01/10/9832    . folic acid (FOLVITE) 1 MG tablet Take 1 mg by mouth daily. Reported on 12/13/2015    . hydrocortisone 2.5 % cream Reported on 12/13/2015    . Multiple Vitamins-Minerals (CENTRUM SILVER PO) Take 1 tablet by mouth daily. Reported on 12/13/2015    . potassium gluconate 595 MG TABS Take 595 mg by mouth daily. Reported on 12/13/2015    . prochlorperazine (COMPAZINE) 10 MG tablet Take 1 tablet (10 mg total) by mouth every 6 (six) hours as needed. 15 tablet 1  . vitamin B-12 (CYANOCOBALAMIN) 500 MCG tablet Take 1,500 mcg by mouth daily. Reported on 12/13/2015    .  Wound Dressings (SONAFINE) Apply 1 application topically 2 (two) times daily. Reported on 12/13/2015     No current facility-administered medications for this visit.     REVIEW OF SYSTEMS:  Constitutional: positive for fatigue Eyes: negative Ears, nose, mouth, throat, and face: negative Respiratory: positive for cough, dyspnea on exertion and  sputum Cardiovascular: negative Gastrointestinal: negative Genitourinary:negative Integument/breast: negative Hematologic/lymphatic: negative Musculoskeletal:negative Neurological: negative Behavioral/Psych: negative Endocrine: negative Allergic/Immunologic: negative   PHYSICAL EXAMINATION: General appearance: alert, cooperative and no distress Head: Normocephalic, without obvious abnormality, atraumatic Neck: no adenopathy Lymph nodes: Cervical, supraclavicular, and axillary nodes normal. Resp: clear to auscultation bilaterally Back: symmetric, no curvature. ROM normal. No CVA tenderness. Cardio: regular rate and rhythm, S1, S2 normal, no murmur, click, rub or gallop GI: soft, non-tender; bowel sounds normal; no masses,  no organomegaly Extremities: extremities normal, atraumatic, no cyanosis or edema Neurologic: Alert and oriented X 3, normal strength and tone. Normal symmetric reflexes. Normal coordination and gait  ECOG PERFORMANCE STATUS: 1 - Symptomatic but completely ambulatory  Blood pressure 112/72, pulse 86, temperature 97.7 F (36.5 C), temperature source Oral, resp. rate 17, height '5\' 2"'$  (1.575 m), weight 138 lb 9.6 oz (62.9 kg), SpO2 99 %.  LABORATORY DATA: Lab Results  Component Value Date   WBC 4.1 01/24/2016   HGB 11.9 01/24/2016   HCT 36.1 01/24/2016   MCV 97.3 01/24/2016   PLT 366 01/24/2016      Chemistry      Component Value Date/Time   NA 140 01/03/2016 0912   K 4.0 01/03/2016 0912   CL 108 (H) 11/04/2012 0947   CO2 24 01/03/2016 0912   BUN 14.2 01/03/2016 0912   CREATININE 0.9 01/03/2016 0912      Component Value Date/Time   CALCIUM 9.3 01/03/2016 0912   ALKPHOS 80 01/03/2016 0912   AST 21 01/03/2016 0912   ALT 9 01/03/2016 0912   BILITOT 0.61 01/03/2016 0912       RADIOGRAPHIC STUDIES: Ct Chest Wo Contrast  Result Date: 01/23/2016 CLINICAL DATA:  Recurrent non-small cell lung cancer initially diagnosed as stage IB adenocarcinoma in  June 2008. Now with enlarging right upper lobe lung mass undergoing palliative radiotherapy. EXAM: CT CHEST WITHOUT CONTRAST TECHNIQUE: Multidetector CT imaging of the chest was performed following the standard protocol without IV contrast. COMPARISON:  09/19/2015 FINDINGS: Cardiovascular: The heart size is normal. No pericardial effusion. Coronary artery calcification is noted. Atherosclerotic calcification is noted in the wall of the throracic aorta. Mediastinum/Nodes: No mediastinal lymphadenopathy. No definite left hilar lymphadenopathy on this study without intravenous contrast material. Interval progression of abnormal soft tissue attenuation in the region of the right hilum, presumably treatment related. Right-sided Port-A-Cath tip positioned at the SVC/RA junction. Lungs/Pleura: Previously measured right upper lobe mass is now more ill-defined areas of cavitation in the adjacent , confluent airspace disease. Area felt to be representative to the lesions seen previously measures 4.5 x 3.7 cm today compared to 6.0 x 4.5 cm previously. Prominent interstitial and focal areas of airspace opacity are now seen in the right lung including a rounded 5 cm region in the superior segment right lower lobe, posterior to the major fissure. This is all new in the interval and presumably represents post radiation evolving fibrosis, but given the degree of confluency, disease progression cannot be entirely excluded. 13 mm area of lingular airspace disease on the previous study is now 17 mm. 14 mm right middle lobe lesion now 22 mm. New small right pleural effusion noted.  Upper Abdomen: Abdominal aortic atherosclerosis. Musculoskeletal: Bone windows reveal no worrisome lytic or sclerotic osseous lesions. IMPRESSION: 1. Right upper lobe lesion measures slightly smaller today although there has been marked interval development of interstitial and airspace disease in the medial right upper and lower lobes. This may represent a  did there is response to radiation, but close follow-up recommended as disease progression remains a distinct concern. 2. Areas of nodular consolidation in the right middle lobe and lingula have progressed in the interval. 3. New small right pleural effusion. 4. Abdominal aortic and coronary artery atherosclerosis. Electronically Signed   By: Misty Stanley M.D.   On: 01/23/2016 17:14   ASSESSMENT AND PLAN: This is a very pleasant 81 years old white female with recurrent non-small cell lung cancer, adenocarcinoma. The patient is currently undergoing maintenance chemotherapy with single agent Alimta status post 87 cycles and she is tolerating it fairly well. She is also status post palliative radiotherapy to the enlarging right upper lobe lung mass. The recent CT scan of the chest showed a smaller size of the right upper lobe lung lesion but there was marked interval development of interstitial air space disease in the medial right upper and lower lobes secondary to radiation. I discussed the scan results and showed the images to the patient today. I recommended for her to continue her treatment with maintenance Alimta with cycle #18 today.  For the radiation-induced pneumonitis, I will start the patient on Medrol Dosepak and may consider her for long-term treatment with prednisone if needed. I also gave her refill of Tussionex. I would see her back for follow-up visit in 3 weeks for evaluation before starting cycle #89. She was advised to call immediately if she has any concerning symptoms in the interval especially any fever or chills.  Disclaimer: This note was dictated with voice recognition software. Similar sounding words can inadvertently be transcribed and may not be corrected upon review.  [name]., MD 01/24/16

## 2016-01-27 ENCOUNTER — Telehealth: Payer: Self-pay | Admitting: Internal Medicine

## 2016-01-27 NOTE — Telephone Encounter (Signed)
Added an additional q3w cycle for 10/25. Patient to get new schedule at 9/13 visit.

## 2016-02-14 ENCOUNTER — Ambulatory Visit (HOSPITAL_BASED_OUTPATIENT_CLINIC_OR_DEPARTMENT_OTHER): Payer: Medicare Other

## 2016-02-14 ENCOUNTER — Ambulatory Visit (HOSPITAL_BASED_OUTPATIENT_CLINIC_OR_DEPARTMENT_OTHER): Payer: Medicare Other | Admitting: Internal Medicine

## 2016-02-14 ENCOUNTER — Other Ambulatory Visit (HOSPITAL_BASED_OUTPATIENT_CLINIC_OR_DEPARTMENT_OTHER): Payer: Medicare Other

## 2016-02-14 ENCOUNTER — Encounter: Payer: Self-pay | Admitting: Internal Medicine

## 2016-02-14 VITALS — BP 126/75 | HR 72 | Temp 98.3°F | Resp 18 | Ht 62.0 in | Wt 136.2 lb

## 2016-02-14 DIAGNOSIS — Z85118 Personal history of other malignant neoplasm of bronchus and lung: Secondary | ICD-10-CM

## 2016-02-14 DIAGNOSIS — C3491 Malignant neoplasm of unspecified part of right bronchus or lung: Secondary | ICD-10-CM

## 2016-02-14 DIAGNOSIS — C3492 Malignant neoplasm of unspecified part of left bronchus or lung: Principal | ICD-10-CM

## 2016-02-14 DIAGNOSIS — Z5111 Encounter for antineoplastic chemotherapy: Secondary | ICD-10-CM

## 2016-02-14 DIAGNOSIS — C3411 Malignant neoplasm of upper lobe, right bronchus or lung: Secondary | ICD-10-CM

## 2016-02-14 LAB — COMPREHENSIVE METABOLIC PANEL
ALBUMIN: 2.8 g/dL — AB (ref 3.5–5.0)
ALK PHOS: 80 U/L (ref 40–150)
ALT: 10 U/L (ref 0–55)
ANION GAP: 9 meq/L (ref 3–11)
AST: 21 U/L (ref 5–34)
BUN: 8.5 mg/dL (ref 7.0–26.0)
CALCIUM: 8.9 mg/dL (ref 8.4–10.4)
CHLORIDE: 108 meq/L (ref 98–109)
CO2: 23 mEq/L (ref 22–29)
CREATININE: 0.8 mg/dL (ref 0.6–1.1)
EGFR: 68 mL/min/{1.73_m2} — ABNORMAL LOW (ref >90)
Glucose: 74 mg/dl (ref 70–140)
POTASSIUM: 3.9 meq/L (ref 3.5–5.1)
Sodium: 140 mEq/L (ref 136–145)
Total Bilirubin: 0.57 mg/dL (ref 0.20–1.20)
Total Protein: 7.1 g/dL (ref 6.4–8.3)

## 2016-02-14 LAB — CBC WITH DIFFERENTIAL/PLATELET
BASO%: 0.7 % (ref 0.0–2.0)
BASOS ABS: 0 10*3/uL (ref 0.0–0.1)
EOS ABS: 0.1 10*3/uL (ref 0.0–0.5)
EOS%: 4.5 % (ref 0.0–7.0)
HEMATOCRIT: 36.2 % (ref 34.8–46.6)
HEMOGLOBIN: 12 g/dL (ref 11.6–15.9)
LYMPH#: 1 10*3/uL (ref 0.9–3.3)
LYMPH%: 36.4 % (ref 14.0–49.7)
MCH: 32.3 pg (ref 25.1–34.0)
MCHC: 33.1 g/dL (ref 31.5–36.0)
MCV: 97.4 fL (ref 79.5–101.0)
MONO#: 0.4 10*3/uL (ref 0.1–0.9)
MONO%: 13.6 % (ref 0.0–14.0)
NEUT#: 1.3 10*3/uL — ABNORMAL LOW (ref 1.5–6.5)
NEUT%: 44.8 % (ref 38.4–76.8)
PLATELETS: 299 10*3/uL (ref 145–400)
RBC: 3.72 10*6/uL (ref 3.70–5.45)
RDW: 15.4 % — AB (ref 11.2–14.5)
WBC: 2.8 10*3/uL — ABNORMAL LOW (ref 3.9–10.3)

## 2016-02-14 MED ORDER — SODIUM CHLORIDE 0.9 % IJ SOLN
10.0000 mL | INTRAMUSCULAR | Status: DC | PRN
  Administered 2016-02-14: 10 mL
  Filled 2016-02-14: qty 10

## 2016-02-14 MED ORDER — SODIUM CHLORIDE 0.9 % IV SOLN
Freq: Once | INTRAVENOUS | Status: AC
  Administered 2016-02-14: 10:00:00 via INTRAVENOUS

## 2016-02-14 MED ORDER — SODIUM CHLORIDE 0.9 % IV SOLN
Freq: Once | INTRAVENOUS | Status: AC
  Administered 2016-02-14: 10:00:00 via INTRAVENOUS
  Filled 2016-02-14: qty 8

## 2016-02-14 MED ORDER — SODIUM CHLORIDE 0.9 % IV SOLN
400.0000 mg/m2 | Freq: Once | INTRAVENOUS | Status: AC
  Administered 2016-02-14: 675 mg via INTRAVENOUS
  Filled 2016-02-14: qty 20

## 2016-02-14 MED ORDER — HEPARIN SOD (PORK) LOCK FLUSH 100 UNIT/ML IV SOLN
500.0000 [IU] | Freq: Once | INTRAVENOUS | Status: AC | PRN
  Administered 2016-02-14: 500 [IU]
  Filled 2016-02-14: qty 5

## 2016-02-14 NOTE — Patient Instructions (Signed)
[facility] Discharge Instructions for Patients Receiving Chemotherapy  Today you received the following chemotherapy agents; Alimta.   To help prevent nausea and vomiting after your treatment, we encourage you to take your nausea medication as directed.    If you develop nausea and vomiting that is not controlled by your nausea medication, call the clinic.   BELOW ARE SYMPTOMS THAT SHOULD BE REPORTED IMMEDIATELY:  *FEVER GREATER THAN 100.5 F  *CHILLS WITH OR WITHOUT FEVER  NAUSEA AND VOMITING THAT IS NOT CONTROLLED WITH YOUR NAUSEA MEDICATION  *UNUSUAL SHORTNESS OF BREATH  *UNUSUAL BRUISING OR BLEEDING  TENDERNESS IN MOUTH AND THROAT WITH OR WITHOUT PRESENCE OF ULCERS  *URINARY PROBLEMS  *BOWEL PROBLEMS  UNUSUAL RASH Items with * indicate a potential emergency and should be followed up as soon as possible.  Feel free to call the clinic you have any questions or concerns. The clinic phone number is [phone].  Please show the CHEMO ALERT CARD at check-in to the Emergency Department and triage nurse.   

## 2016-02-14 NOTE — Progress Notes (Signed)
[facility] Telephone:(336) [phone]   Fax:(336) [place], MD [place] [address]  Principle Diagnosis and stage: Recurrent non-small cell lung cancer, initially diagnosed a stage IB (T2, N0, M0) adenocarcinoma in June 2008 with a tumor size of 8 cm.   Prior Therapy:  #1 status post left upper lobectomy with lymph node dissection under the care of Dr. Arlyce Dice on 06/29/2006.  #2 status post 4 cycles of adjuvant chemotherapy with cisplatin and Taxotere last dose given 08/01/2006.  #3 status post 6 cycles of systemic chemotherapy with carboplatin and Alimta for disease recurrence last dose given 12/12/2009.  #4 palliative radiotherapy to the enlarging right upper lobe lung mass under the care of Dr. Pablo Ledger completed on 11/14/2015.  Current therapy: Maintenance chemotherapy with Alimta at 500 mg per meter squared given every 3 weeks status post 88 cycles.  CHEMOTHERAPY INTENT: Palliative/maintenance.  CURRENT # OF CHEMOTHERAPY CYCLES: 89  CURRENT ANTIEMETICS: Zofran, dexamethasone and Compazine  CURRENT SMOKING STATUS: Never Smoker.   ORAL CHEMOTHERAPY AND CONSENT: None   CURRENT BISPHOSPHONATES USE: None  PAIN MANAGEMENT: None  NARCOTICS INDUCED CONSTIPATION: None  LIVING WILL AND CODE STATUS: has living will for no CODE BLUE.  INTERVAL HISTORY: [name] 82 y.o. female returns to the clinic today for follow up visit. The patient is feeling much better today. Her shortness of breath, cough and fatigue improved after the Medrol Dosepak 3 weeks ago. She tolerated the last cycle of her maintenance chemotherapy with single agent Alimta fairly well. She status post 88 cycles. The patient denied having any significant nausea or vomiting. She has no fever or chills. The patient denied having any significant chest pain, shortness of breath, or hemoptysis. She has no weight loss or night sweats. She had  repeat CT scan of the chest performed recently and she is here for evaluation and discussion of her scan results.  MEDICAL HISTORY: Past Medical History:  Diagnosis Date  . FHx: chemotherapy 2008&2011   4 cycles cisplatin,taxotere,2008& carboplatin and Alimta 2011  . History of migraine headaches   . Hypercholesterolemia   . lung ca dx'd 06/2006   rt and lt lung  . Lung cancer ([facility]) 3/14/1   right- Adenocarcinoma w/bronchioalveolar features  . Radiation 11/01/15-11/14/15   right upper lobe 30 gray  . Radiation pneumonitis ([place]) 01/24/2016    ALLERGIES:  is allergic to simvastatin and iodinated diagnostic agents.  MEDICATIONS:  Current Outpatient Prescriptions  Medication Sig Dispense Refill  . Calcium Carbonate-Vit D-Min (CALTRATE PLUS PO) Take 500 mg by mouth 2 (two) times daily. Reported on 12/13/2015    . chlorpheniramine-HYDROcodone (TUSSIONEX) 10-8 MG/5ML SUER Take 5 mLs by mouth 2 (two) times daily. 140 mL 0  . Multiple Vitamins-Minerals (CENTRUM SILVER PO) Take 1 tablet by mouth daily. Reported on 12/13/2015    . vitamin B-12 (CYANOCOBALAMIN) 500 MCG tablet Take 1,500 mcg by mouth daily. Reported on 5/46/5681    . folic acid (FOLVITE) 1 MG tablet Take 1 mg by mouth daily. Reported on 12/13/2015    . prochlorperazine (COMPAZINE) 10 MG tablet Take 1 tablet (10 mg total) by mouth every 6 (six) hours as needed. (Patient not taking: Reported on 02/14/2016) 15 tablet 1   No current facility-administered medications for this visit.     REVIEW OF SYSTEMS:  A comprehensive review of systems was negative except for: Constitutional: positive for fatigue   PHYSICAL EXAMINATION: General appearance:  alert, cooperative and no distress Head: Normocephalic, without obvious abnormality, atraumatic Neck: no adenopathy Lymph nodes: Cervical, supraclavicular, and axillary nodes normal. Resp: clear to auscultation bilaterally Back: symmetric, no curvature. ROM normal. No CVA tenderness. Cardio:  regular rate and rhythm, S1, S2 normal, no murmur, click, rub or gallop GI: soft, non-tender; bowel sounds normal; no masses,  no organomegaly Extremities: extremities normal, atraumatic, no cyanosis or edema Neurologic: Alert and oriented X 3, normal strength and tone. Normal symmetric reflexes. Normal coordination and gait  ECOG PERFORMANCE STATUS: 1 - Symptomatic but completely ambulatory  Blood pressure 126/75, pulse 72, temperature 98.3 F (36.8 C), temperature source Oral, resp. rate 18, height '5\' 2"'$  (1.575 m), weight 136 lb 3.2 oz (61.8 kg), SpO2 99 %.  LABORATORY DATA: Lab Results  Component Value Date   WBC 2.8 (L) 02/14/2016   HGB 12.0 02/14/2016   HCT 36.2 02/14/2016   MCV 97.4 02/14/2016   PLT 299 02/14/2016      Chemistry      Component Value Date/Time   NA 140 02/14/2016 0909   K 3.9 02/14/2016 0909   CL 108 (H) 11/04/2012 0947   CO2 23 02/14/2016 0909   BUN 8.5 02/14/2016 0909   CREATININE 0.8 02/14/2016 0909      Component Value Date/Time   CALCIUM 8.9 02/14/2016 0909   ALKPHOS 80 02/14/2016 0909   AST 21 02/14/2016 0909   ALT 10 02/14/2016 0909   BILITOT 0.57 02/14/2016 0909       RADIOGRAPHIC STUDIES: Ct Chest Wo Contrast  Result Date: 01/23/2016 CLINICAL DATA:  Recurrent non-small cell lung cancer initially diagnosed as stage IB adenocarcinoma in June 2008. Now with enlarging right upper lobe lung mass undergoing palliative radiotherapy. EXAM: CT CHEST WITHOUT CONTRAST TECHNIQUE: Multidetector CT imaging of the chest was performed following the standard protocol without IV contrast. COMPARISON:  09/19/2015 FINDINGS: Cardiovascular: The heart size is normal. No pericardial effusion. Coronary artery calcification is noted. Atherosclerotic calcification is noted in the wall of the throracic aorta. Mediastinum/Nodes: No mediastinal lymphadenopathy. No definite left hilar lymphadenopathy on this study without intravenous contrast material. Interval progression  of abnormal soft tissue attenuation in the region of the right hilum, presumably treatment related. Right-sided Port-A-Cath tip positioned at the SVC/RA junction. Lungs/Pleura: Previously measured right upper lobe mass is now more ill-defined areas of cavitation in the adjacent , confluent airspace disease. Area felt to be representative to the lesions seen previously measures 4.5 x 3.7 cm today compared to 6.0 x 4.5 cm previously. Prominent interstitial and focal areas of airspace opacity are now seen in the right lung including a rounded 5 cm region in the superior segment right lower lobe, posterior to the major fissure. This is all new in the interval and presumably represents post radiation evolving fibrosis, but given the degree of confluency, disease progression cannot be entirely excluded. 13 mm area of lingular airspace disease on the previous study is now 17 mm. 14 mm right middle lobe lesion now 22 mm. New small right pleural effusion noted. Upper Abdomen: Abdominal aortic atherosclerosis. Musculoskeletal: Bone windows reveal no worrisome lytic or sclerotic osseous lesions. IMPRESSION: 1. Right upper lobe lesion measures slightly smaller today although there has been marked interval development of interstitial and airspace disease in the medial right upper and lower lobes. This may represent a did there is response to radiation, but close follow-up recommended as disease progression remains a distinct concern. 2. Areas of nodular consolidation in the right middle lobe and  lingula have progressed in the interval. 3. New small right pleural effusion. 4. Abdominal aortic and coronary artery atherosclerosis. Electronically Signed   By: Misty Stanley M.D.   On: 01/23/2016 17:14   ASSESSMENT AND PLAN: This is a very pleasant 82 years old white female with recurrent non-small cell lung cancer, adenocarcinoma. The patient is currently undergoing maintenance chemotherapy with single agent Alimta status post 88  cycles and she is tolerating it fairly well. She is also status post palliative radiotherapy to the enlarging right upper lobe lung mass. I recommended for her to continue her treatment with maintenance Alimta with cycle #89 today.  I would see her back for follow-up visit in 3 weeks for evaluation before starting cycle #90. She was advised to call immediately if she has any concerning symptoms in the interval especially any fever or chills.  Disclaimer: This note was dictated with voice recognition software. Similar sounding words can inadvertently be transcribed and may not be corrected upon review.  [name]., MD 02/14/16

## 2016-02-14 NOTE — Progress Notes (Signed)
Per [name] it is okay to treat pt today with chemotherapy and todays lab results.

## 2016-03-06 ENCOUNTER — Telehealth: Payer: Self-pay | Admitting: *Deleted

## 2016-03-06 ENCOUNTER — Other Ambulatory Visit: Payer: Self-pay | Admitting: Medical Oncology

## 2016-03-06 ENCOUNTER — Ambulatory Visit (HOSPITAL_BASED_OUTPATIENT_CLINIC_OR_DEPARTMENT_OTHER): Payer: Medicare Other

## 2016-03-06 ENCOUNTER — Ambulatory Visit (HOSPITAL_BASED_OUTPATIENT_CLINIC_OR_DEPARTMENT_OTHER): Payer: Medicare Other | Admitting: Internal Medicine

## 2016-03-06 ENCOUNTER — Encounter: Payer: Self-pay | Admitting: Internal Medicine

## 2016-03-06 ENCOUNTER — Other Ambulatory Visit (HOSPITAL_BASED_OUTPATIENT_CLINIC_OR_DEPARTMENT_OTHER): Payer: Medicare Other

## 2016-03-06 ENCOUNTER — Telehealth: Payer: Self-pay | Admitting: Internal Medicine

## 2016-03-06 VITALS — BP 138/67 | HR 83 | Temp 97.6°F | Resp 18 | Ht 62.0 in | Wt 138.1 lb

## 2016-03-06 DIAGNOSIS — Z5111 Encounter for antineoplastic chemotherapy: Secondary | ICD-10-CM

## 2016-03-06 DIAGNOSIS — C3411 Malignant neoplasm of upper lobe, right bronchus or lung: Secondary | ICD-10-CM

## 2016-03-06 DIAGNOSIS — C3412 Malignant neoplasm of upper lobe, left bronchus or lung: Secondary | ICD-10-CM

## 2016-03-06 DIAGNOSIS — C3492 Malignant neoplasm of unspecified part of left bronchus or lung: Principal | ICD-10-CM

## 2016-03-06 DIAGNOSIS — Z23 Encounter for immunization: Secondary | ICD-10-CM | POA: Diagnosis not present

## 2016-03-06 DIAGNOSIS — C3491 Malignant neoplasm of unspecified part of right bronchus or lung: Secondary | ICD-10-CM

## 2016-03-06 LAB — CBC WITH DIFFERENTIAL/PLATELET
BASO%: 0.6 % (ref 0.0–2.0)
Basophils Absolute: 0 10*3/uL (ref 0.0–0.1)
EOS%: 1.3 % (ref 0.0–7.0)
Eosinophils Absolute: 0 10*3/uL (ref 0.0–0.5)
HCT: 36.4 % (ref 34.8–46.6)
HGB: 11.9 g/dL (ref 11.6–15.9)
LYMPH#: 1.1 10*3/uL (ref 0.9–3.3)
LYMPH%: 35.1 % (ref 14.0–49.7)
MCH: 32.2 pg (ref 25.1–34.0)
MCHC: 32.7 g/dL (ref 31.5–36.0)
MCV: 98.6 fL (ref 79.5–101.0)
MONO#: 0.3 10*3/uL (ref 0.1–0.9)
MONO%: 11 % (ref 0.0–14.0)
NEUT#: 1.6 10*3/uL (ref 1.5–6.5)
NEUT%: 52 % (ref 38.4–76.8)
Platelets: 333 10*3/uL (ref 145–400)
RBC: 3.69 10*6/uL — AB (ref 3.70–5.45)
RDW: 16.4 % — AB (ref 11.2–14.5)
WBC: 3.1 10*3/uL — ABNORMAL LOW (ref 3.9–10.3)

## 2016-03-06 LAB — COMPREHENSIVE METABOLIC PANEL
ANION GAP: 8 meq/L (ref 3–11)
AST: 19 U/L (ref 5–34)
Albumin: 2.9 g/dL — ABNORMAL LOW (ref 3.5–5.0)
Alkaline Phosphatase: 83 U/L (ref 40–150)
BUN: 15.7 mg/dL (ref 7.0–26.0)
CHLORIDE: 111 meq/L — AB (ref 98–109)
CO2: 22 meq/L (ref 22–29)
CREATININE: 0.8 mg/dL (ref 0.6–1.1)
Calcium: 8.9 mg/dL (ref 8.4–10.4)
EGFR: 69 mL/min/{1.73_m2} — ABNORMAL LOW (ref >90)
Glucose: 95 mg/dl (ref 70–140)
POTASSIUM: 3.5 meq/L (ref 3.5–5.1)
Sodium: 141 mEq/L (ref 136–145)
Total Bilirubin: 0.62 mg/dL (ref 0.20–1.20)
Total Protein: 7.1 g/dL (ref 6.4–8.3)

## 2016-03-06 MED ORDER — CYANOCOBALAMIN 1000 MCG/ML IJ SOLN
INTRAMUSCULAR | Status: AC
  Filled 2016-03-06: qty 1

## 2016-03-06 MED ORDER — SODIUM CHLORIDE 0.9 % IJ SOLN
10.0000 mL | INTRAMUSCULAR | Status: DC | PRN
  Administered 2016-03-06: 10 mL
  Filled 2016-03-06: qty 10

## 2016-03-06 MED ORDER — SODIUM CHLORIDE 0.9 % IV SOLN
400.0000 mg/m2 | Freq: Once | INTRAVENOUS | Status: AC
  Administered 2016-03-06: 675 mg via INTRAVENOUS
  Filled 2016-03-06: qty 20

## 2016-03-06 MED ORDER — DEXAMETHASONE SODIUM PHOSPHATE 100 MG/10ML IJ SOLN
Freq: Once | INTRAMUSCULAR | Status: AC
  Administered 2016-03-06: 11:00:00 via INTRAVENOUS
  Filled 2016-03-06: qty 8

## 2016-03-06 MED ORDER — INFLUENZA VAC SPLIT QUAD 0.5 ML IM SUSY
0.5000 mL | PREFILLED_SYRINGE | Freq: Once | INTRAMUSCULAR | Status: DC
  Filled 2016-03-06: qty 0.5

## 2016-03-06 MED ORDER — INFLUENZA VAC SPLIT QUAD 0.5 ML IM SUSY: 0.5000 mL | PREFILLED_SYRINGE | Freq: Once | INTRAMUSCULAR | Status: DC

## 2016-03-06 MED ORDER — INFLUENZA VAC SPLIT QUAD 0.5 ML IM SUSY
0.5000 mL | PREFILLED_SYRINGE | Freq: Once | INTRAMUSCULAR | Status: AC
  Administered 2016-03-06: 0.5 mL via INTRAMUSCULAR
  Filled 2016-03-06: qty 0.5

## 2016-03-06 MED ORDER — CYANOCOBALAMIN 1000 MCG/ML IJ SOLN
1000.0000 ug | Freq: Once | INTRAMUSCULAR | Status: AC
  Administered 2016-03-06: 1000 ug via INTRAMUSCULAR

## 2016-03-06 MED ORDER — SODIUM CHLORIDE 0.9 % IV SOLN
Freq: Once | INTRAVENOUS | Status: AC
  Administered 2016-03-06: 10:00:00 via INTRAVENOUS

## 2016-03-06 MED ORDER — HEPARIN SOD (PORK) LOCK FLUSH 100 UNIT/ML IV SOLN
500.0000 [IU] | Freq: Once | INTRAVENOUS | Status: AC | PRN
  Administered 2016-03-06: 500 [IU]
  Filled 2016-03-06: qty 5

## 2016-03-06 NOTE — Patient Instructions (Signed)
[facility] Discharge Instructions for Patients Receiving Chemotherapy  Today you received the following chemotherapy agents; Alimta.   To help prevent nausea and vomiting after your treatment, we encourage you to take your nausea medication as directed.    If you develop nausea and vomiting that is not controlled by your nausea medication, call the clinic.   BELOW ARE SYMPTOMS THAT SHOULD BE REPORTED IMMEDIATELY:  *FEVER GREATER THAN 100.5 F  *CHILLS WITH OR WITHOUT FEVER  NAUSEA AND VOMITING THAT IS NOT CONTROLLED WITH YOUR NAUSEA MEDICATION  *UNUSUAL SHORTNESS OF BREATH  *UNUSUAL BRUISING OR BLEEDING  TENDERNESS IN MOUTH AND THROAT WITH OR WITHOUT PRESENCE OF ULCERS  *URINARY PROBLEMS  *BOWEL PROBLEMS  UNUSUAL RASH Items with * indicate a potential emergency and should be followed up as soon as possible.  Feel free to call the clinic you have any questions or concerns. The clinic phone number is [phone].  Please show the CHEMO ALERT CARD at check-in to the Emergency Department and triage nurse.   

## 2016-03-06 NOTE — Telephone Encounter (Signed)
Message sent to chemo scheduler to be added. Avs report and  Appointment schedule, given to patient, per 03/06/16 los.

## 2016-03-06 NOTE — Addendum Note (Signed)
Addended by: Tora Kindred on: 03/06/2016 10:31 AM   Modules accepted: Orders

## 2016-03-06 NOTE — Telephone Encounter (Signed)
Per LOS I have scheduled appts and notified the scheduler 

## 2016-03-06 NOTE — Progress Notes (Signed)
[place] Telephone:(336) [phone]   Fax:(336) [place], MD [place] [address]  Principle Diagnosis and stage: Recurrent non-small cell lung cancer, initially diagnosed a stage IB (T2, N0, M0) adenocarcinoma in June 2008 with a tumor size of 8 cm.   Prior Therapy:  #1 status post left upper lobectomy with lymph node dissection under the care of Dr. Arlyce Dice on 06/29/2006.  #2 status post 4 cycles of adjuvant chemotherapy with cisplatin and Taxotere last dose given 08/01/2006.  #3 status post 6 cycles of systemic chemotherapy with carboplatin and Alimta for disease recurrence last dose given 12/12/2009.  #4 palliative radiotherapy to the enlarging right upper lobe lung mass under the care of Dr. Pablo Ledger completed on 11/14/2015.  Current therapy: Maintenance chemotherapy with Alimta at 500 mg per meter squared given every 3 weeks status post 89 cycles.  CHEMOTHERAPY INTENT: Palliative/maintenance.  CURRENT # OF CHEMOTHERAPY CYCLES: 90  CURRENT ANTIEMETICS: Zofran, dexamethasone and Compazine  CURRENT SMOKING STATUS: Never Smoker.   ORAL CHEMOTHERAPY AND CONSENT: None   CURRENT BISPHOSPHONATES USE: None  PAIN MANAGEMENT: None  NARCOTICS INDUCED CONSTIPATION: None  LIVING WILL AND CODE STATUS: has living will for no CODE BLUE.  INTERVAL HISTORY: [name] 81 y.o. female returns to the clinic today for follow up visit. The patient is feeling much better today. She tolerated the last cycle of her maintenance chemotherapy with single agent Alimta fairly well. The patient denied having any significant nausea or vomiting. She has no fever or chills. The patient denied having any significant chest pain, shortness of breath, or hemoptysis. She has no weight loss or night sweats. She is here today to start cycle #90.  MEDICAL HISTORY: Past Medical History:  Diagnosis Date  . FHx: chemotherapy  2008&2011   4 cycles cisplatin,taxotere,2008& carboplatin and Alimta 2011  . History of migraine headaches   . Hypercholesterolemia   . lung ca dx'd 06/2006   rt and lt lung  . Lung cancer ([facility]) 3/14/1   right- Adenocarcinoma w/bronchioalveolar features  . Radiation 11/01/15-11/14/15   right upper lobe 30 gray  . Radiation pneumonitis ([place]) 01/24/2016    ALLERGIES:  is allergic to simvastatin and iodinated diagnostic agents.  MEDICATIONS:  Current Outpatient Prescriptions  Medication Sig Dispense Refill  . Calcium Carbonate-Vit D-Min (CALTRATE PLUS PO) Take 500 mg by mouth 2 (two) times daily. Reported on 12/13/2015    . chlorpheniramine-HYDROcodone (TUSSIONEX) 10-8 MG/5ML SUER Take 5 mLs by mouth 2 (two) times daily. 250 mL 0  . folic acid (FOLVITE) 1 MG tablet Take 1 mg by mouth daily. Reported on 12/13/2015    . Multiple Vitamins-Minerals (CENTRUM SILVER PO) Take 1 tablet by mouth daily. Reported on 12/13/2015    . prochlorperazine (COMPAZINE) 10 MG tablet Take 1 tablet (10 mg total) by mouth every 6 (six) hours as needed. 15 tablet 1  . vitamin B-12 (CYANOCOBALAMIN) 500 MCG tablet Take 1,500 mcg by mouth daily. Reported on 12/13/2015     No current facility-administered medications for this visit.     REVIEW OF SYSTEMS:  A comprehensive review of systems was negative except for: Constitutional: positive for fatigue   PHYSICAL EXAMINATION: General appearance: alert, cooperative and no distress Head: Normocephalic, without obvious abnormality, atraumatic Neck: no adenopathy Lymph nodes: Cervical, supraclavicular, and axillary nodes normal. Resp: clear to auscultation bilaterally Back: symmetric, no curvature. ROM normal. No CVA tenderness. Cardio: regular rate and  rhythm, S1, S2 normal, no murmur, click, rub or gallop GI: soft, non-tender; bowel sounds normal; no masses,  no organomegaly Extremities: extremities normal, atraumatic, no cyanosis or edema Neurologic: Alert and oriented  X 3, normal strength and tone. Normal symmetric reflexes. Normal coordination and gait  ECOG PERFORMANCE STATUS: 1 - Symptomatic but completely ambulatory  Blood pressure 138/67, pulse 83, temperature 97.6 F (36.4 C), temperature source Oral, resp. rate 18, height '5\' 2"'$  (1.575 m), weight 138 lb 1.6 oz (62.6 kg), SpO2 100 %.  LABORATORY DATA: Lab Results  Component Value Date   WBC 3.1 (L) 03/06/2016   HGB 11.9 03/06/2016   HCT 36.4 03/06/2016   MCV 98.6 03/06/2016   PLT 333 03/06/2016      Chemistry      Component Value Date/Time   NA 140 02/14/2016 0909   K 3.9 02/14/2016 0909   CL 108 (H) 11/04/2012 0947   CO2 23 02/14/2016 0909   BUN 8.5 02/14/2016 0909   CREATININE 0.8 02/14/2016 0909      Component Value Date/Time   CALCIUM 8.9 02/14/2016 0909   ALKPHOS 80 02/14/2016 0909   AST 21 02/14/2016 0909   ALT 10 02/14/2016 0909   BILITOT 0.57 02/14/2016 0909       RADIOGRAPHIC STUDIES: No results found. ASSESSMENT AND PLAN: This is a very pleasant 82 years old white female with recurrent non-small cell lung cancer, adenocarcinoma. The patient is currently undergoing maintenance chemotherapy with single agent Alimta status post 88 cycles and she is tolerating it fairly well. She is also status post palliative radiotherapy to the enlarging right upper lobe lung mass. I recommended for her to continue her treatment with maintenance Alimta with cycle #90 today.  I would see her back for follow-up visit in 3 weeks for evaluation before starting cycle #91 with repeat blood work. She was advised to call immediately if she has any concerning symptoms in the interval.  Disclaimer: This note was dictated with voice recognition software. Similar sounding words can inadvertently be transcribed and may not be corrected upon review.  [name]., MD 03/06/16

## 2016-03-27 ENCOUNTER — Telehealth: Payer: Self-pay | Admitting: Internal Medicine

## 2016-03-27 ENCOUNTER — Ambulatory Visit (HOSPITAL_BASED_OUTPATIENT_CLINIC_OR_DEPARTMENT_OTHER): Payer: Medicare Other | Admitting: Internal Medicine

## 2016-03-27 ENCOUNTER — Encounter: Payer: Self-pay | Admitting: Internal Medicine

## 2016-03-27 ENCOUNTER — Other Ambulatory Visit (HOSPITAL_BASED_OUTPATIENT_CLINIC_OR_DEPARTMENT_OTHER): Payer: Medicare Other

## 2016-03-27 ENCOUNTER — Ambulatory Visit (HOSPITAL_BASED_OUTPATIENT_CLINIC_OR_DEPARTMENT_OTHER): Payer: Medicare Other

## 2016-03-27 VITALS — BP 104/69 | HR 82 | Temp 97.5°F | Resp 18 | Ht 62.0 in | Wt 137.5 lb

## 2016-03-27 DIAGNOSIS — Z85118 Personal history of other malignant neoplasm of bronchus and lung: Secondary | ICD-10-CM | POA: Diagnosis not present

## 2016-03-27 DIAGNOSIS — Z5111 Encounter for antineoplastic chemotherapy: Secondary | ICD-10-CM

## 2016-03-27 DIAGNOSIS — C3492 Malignant neoplasm of unspecified part of left bronchus or lung: Principal | ICD-10-CM

## 2016-03-27 DIAGNOSIS — C3411 Malignant neoplasm of upper lobe, right bronchus or lung: Secondary | ICD-10-CM

## 2016-03-27 DIAGNOSIS — C3491 Malignant neoplasm of unspecified part of right bronchus or lung: Secondary | ICD-10-CM

## 2016-03-27 LAB — COMPREHENSIVE METABOLIC PANEL
ALBUMIN: 3.1 g/dL — AB (ref 3.5–5.0)
ALK PHOS: 92 U/L (ref 40–150)
ALT: 9 U/L (ref 0–55)
AST: 20 U/L (ref 5–34)
Anion Gap: 8 mEq/L (ref 3–11)
BILIRUBIN TOTAL: 0.4 mg/dL (ref 0.20–1.20)
BUN: 13.6 mg/dL (ref 7.0–26.0)
CALCIUM: 9.2 mg/dL (ref 8.4–10.4)
CHLORIDE: 110 meq/L — AB (ref 98–109)
CO2: 23 mEq/L (ref 22–29)
CREATININE: 0.8 mg/dL (ref 0.6–1.1)
EGFR: 68 mL/min/{1.73_m2} — ABNORMAL LOW (ref >90)
Glucose: 92 mg/dl (ref 70–140)
Potassium: 4 mEq/L (ref 3.5–5.1)
Sodium: 140 mEq/L (ref 136–145)
TOTAL PROTEIN: 7.7 g/dL (ref 6.4–8.3)

## 2016-03-27 LAB — CBC WITH DIFFERENTIAL/PLATELET
BASO%: 0.5 % (ref 0.0–2.0)
Basophils Absolute: 0 10*3/uL (ref 0.0–0.1)
EOS%: 1 % (ref 0.0–7.0)
Eosinophils Absolute: 0 10*3/uL (ref 0.0–0.5)
HEMATOCRIT: 37.7 % (ref 34.8–46.6)
HEMOGLOBIN: 12.1 g/dL (ref 11.6–15.9)
LYMPH#: 1 10*3/uL (ref 0.9–3.3)
LYMPH%: 26 % (ref 14.0–49.7)
MCH: 32 pg (ref 25.1–34.0)
MCHC: 32.1 g/dL (ref 31.5–36.0)
MCV: 99.5 fL (ref 79.5–101.0)
MONO#: 0.5 10*3/uL (ref 0.1–0.9)
MONO%: 11.9 % (ref 0.0–14.0)
NEUT#: 2.3 10*3/uL (ref 1.5–6.5)
NEUT%: 60.6 % (ref 38.4–76.8)
Platelets: 324 10*3/uL (ref 145–400)
RBC: 3.79 10*6/uL (ref 3.70–5.45)
RDW: 16.8 % — AB (ref 11.2–14.5)
WBC: 3.8 10*3/uL — ABNORMAL LOW (ref 3.9–10.3)

## 2016-03-27 MED ORDER — SODIUM CHLORIDE 0.9 % IJ SOLN
10.0000 mL | INTRAMUSCULAR | Status: DC | PRN
  Administered 2016-03-27: 10 mL
  Filled 2016-03-27: qty 10

## 2016-03-27 MED ORDER — SODIUM CHLORIDE 0.9 % IV SOLN
400.0000 mg/m2 | Freq: Once | INTRAVENOUS | Status: AC
  Administered 2016-03-27: 675 mg via INTRAVENOUS
  Filled 2016-03-27: qty 7

## 2016-03-27 MED ORDER — HEPARIN SOD (PORK) LOCK FLUSH 100 UNIT/ML IV SOLN
500.0000 [IU] | Freq: Once | INTRAVENOUS | Status: AC | PRN
  Administered 2016-03-27: 500 [IU]
  Filled 2016-03-27: qty 5

## 2016-03-27 MED ORDER — DEXAMETHASONE SODIUM PHOSPHATE 100 MG/10ML IJ SOLN
Freq: Once | INTRAMUSCULAR | Status: AC
  Administered 2016-03-27: 14:00:00 via INTRAVENOUS
  Filled 2016-03-27: qty 8

## 2016-03-27 MED ORDER — SODIUM CHLORIDE 0.9 % IV SOLN
Freq: Once | INTRAVENOUS | Status: AC
  Administered 2016-03-27: 13:00:00 via INTRAVENOUS

## 2016-03-27 NOTE — Telephone Encounter (Signed)
Gave patient avs report and appointments for November and December  °

## 2016-03-27 NOTE — Patient Instructions (Signed)
[facility] Discharge Instructions for Patients Receiving Chemotherapy  Today you received the following chemotherapy agents; Alimta.   To help prevent nausea and vomiting after your treatment, we encourage you to take your nausea medication as directed.    If you develop nausea and vomiting that is not controlled by your nausea medication, call the clinic.   BELOW ARE SYMPTOMS THAT SHOULD BE REPORTED IMMEDIATELY:  *FEVER GREATER THAN 100.5 F  *CHILLS WITH OR WITHOUT FEVER  NAUSEA AND VOMITING THAT IS NOT CONTROLLED WITH YOUR NAUSEA MEDICATION  *UNUSUAL SHORTNESS OF BREATH  *UNUSUAL BRUISING OR BLEEDING  TENDERNESS IN MOUTH AND THROAT WITH OR WITHOUT PRESENCE OF ULCERS  *URINARY PROBLEMS  *BOWEL PROBLEMS  UNUSUAL RASH Items with * indicate a potential emergency and should be followed up as soon as possible.  Feel free to call the clinic you have any questions or concerns. The clinic phone number is [phone].  Please show the CHEMO ALERT CARD at check-in to the Emergency Department and triage nurse.   

## 2016-03-27 NOTE — Progress Notes (Signed)
[place] Telephone:(336) [phone]   Fax:(336) [place], MD [place] [address]  Principle Diagnosis and stage: Recurrent non-small cell lung cancer, initially diagnosed a stage IB (T2, N0, M0) adenocarcinoma in June 2008 with a tumor size of 8 cm.   Prior Therapy:  #1 status post left upper lobectomy with lymph node dissection under the care of Dr. Arlyce Dice on 06/29/2006.  #2 status post 4 cycles of adjuvant chemotherapy with cisplatin and Taxotere last dose given 08/01/2006.  #3 status post 6 cycles of systemic chemotherapy with carboplatin and Alimta for disease recurrence last dose given 12/12/2009.  #4 palliative radiotherapy to the enlarging right upper lobe lung mass under the care of Dr. Pablo Ledger completed on 11/14/2015.  Current therapy: Maintenance chemotherapy with Alimta at 400 mg per meter squared given every 3 weeks status post 90 cycles.  CHEMOTHERAPY INTENT: Palliative/maintenance.  CURRENT # OF CHEMOTHERAPY CYCLES: 91  CURRENT ANTIEMETICS: Zofran, dexamethasone and Compazine  CURRENT SMOKING STATUS: Never Smoker.   ORAL CHEMOTHERAPY AND CONSENT: None   CURRENT BISPHOSPHONATES USE: None  PAIN MANAGEMENT: None  NARCOTICS INDUCED CONSTIPATION: None  LIVING WILL AND CODE STATUS: has living will for no CODE BLUE.  INTERVAL HISTORY: [name] 82 y.o. female returns to the clinic today for follow up visit. The patient is feeling well today with no specific complaints. She tolerated the last cycle of her maintenance chemotherapy with Alimta fairly well. The patient denied having any significant nausea or vomiting. She has no fever or chills. The patient denied having any significant chest pain, shortness of breath, or hemoptysis. She has no weight loss or night sweats. She is here today to start cycle #91.  MEDICAL HISTORY: Past Medical History:  Diagnosis Date  . FHx:  chemotherapy 2008&2011   4 cycles cisplatin,taxotere,2008& carboplatin and Alimta 2011  . History of migraine headaches   . Hypercholesterolemia   . lung ca dx'd 06/2006   rt and lt lung  . Lung cancer ([facility]) 3/14/1   right- Adenocarcinoma w/bronchioalveolar features  . Radiation 11/01/15-11/14/15   right upper lobe 30 gray  . Radiation pneumonitis ([facility]) 01/24/2016    ALLERGIES:  is allergic to simvastatin and iodinated diagnostic agents.  MEDICATIONS:  Current Outpatient Prescriptions  Medication Sig Dispense Refill  . Calcium Carbonate-Vit D-Min (CALTRATE PLUS PO) Take 500 mg by mouth 2 (two) times daily. Reported on 12/13/2015    . chlorpheniramine-HYDROcodone (TUSSIONEX) 10-8 MG/5ML SUER Take 5 mLs by mouth 2 (two) times daily. 631 mL 0  . folic acid (FOLVITE) 1 MG tablet Take 1 mg by mouth daily. Reported on 12/13/2015    . Multiple Vitamins-Minerals (CENTRUM SILVER PO) Take 1 tablet by mouth daily. Reported on 12/13/2015    . prochlorperazine (COMPAZINE) 10 MG tablet Take 1 tablet (10 mg total) by mouth every 6 (six) hours as needed. 15 tablet 1  . vitamin B-12 (CYANOCOBALAMIN) 500 MCG tablet Take 1,500 mcg by mouth daily. Reported on 12/13/2015     No current facility-administered medications for this visit.     REVIEW OF SYSTEMS:  A comprehensive review of systems was negative except for: Constitutional: positive for fatigue   PHYSICAL EXAMINATION: General appearance: alert, cooperative and no distress Head: Normocephalic, without obvious abnormality, atraumatic Neck: no adenopathy Lymph nodes: Cervical, supraclavicular, and axillary nodes normal. Resp: clear to auscultation bilaterally Back: symmetric, no curvature. ROM normal. No CVA tenderness. Cardio: regular rate  and rhythm, S1, S2 normal, no murmur, click, rub or gallop GI: soft, non-tender; bowel sounds normal; no masses,  no organomegaly Extremities: extremities normal, atraumatic, no cyanosis or edema Neurologic: Alert  and oriented X 3, normal strength and tone. Normal symmetric reflexes. Normal coordination and gait  ECOG PERFORMANCE STATUS: 1 - Symptomatic but completely ambulatory  There were no vitals taken for this visit.  LABORATORY DATA: Lab Results  Component Value Date   WBC 3.8 (L) 03/27/2016   HGB 12.1 03/27/2016   HCT 37.7 03/27/2016   MCV 99.5 03/27/2016   PLT 324 03/27/2016      Chemistry      Component Value Date/Time   NA 140 03/27/2016 1111   K 4.0 03/27/2016 1111   CL 108 (H) 11/04/2012 0947   CO2 23 03/27/2016 1111   BUN 13.6 03/27/2016 1111   CREATININE 0.8 03/27/2016 1111      Component Value Date/Time   CALCIUM 9.2 03/27/2016 1111   ALKPHOS 92 03/27/2016 1111   AST 20 03/27/2016 1111   ALT 9 03/27/2016 1111   BILITOT 0.40 03/27/2016 1111       RADIOGRAPHIC STUDIES: No results found. ASSESSMENT AND PLAN: This is a very pleasant 82 years old white female with recurrent non-small cell lung cancer, adenocarcinoma. The patient is currently undergoing maintenance chemotherapy with single agent Alimta status post 90 cycles and she is tolerating it fairly well. She is also status post palliative radiotherapy to the enlarging right upper lobe lung mass. I recommended for her to continue her treatment with maintenance Alimta with cycle #91 today.  I would see her back for follow-up visit in 3 weeks for evaluation before starting cycle #92 with repeat blood work. She was advised to call immediately if she has any concerning symptoms in the interval.  Disclaimer: This note was dictated with voice recognition software. Similar sounding words can inadvertently be transcribed and may not be corrected upon review.  [name]., MD 03/27/16

## 2016-04-17 ENCOUNTER — Ambulatory Visit (HOSPITAL_BASED_OUTPATIENT_CLINIC_OR_DEPARTMENT_OTHER): Payer: Medicare Other

## 2016-04-17 ENCOUNTER — Encounter: Payer: Self-pay | Admitting: Internal Medicine

## 2016-04-17 ENCOUNTER — Ambulatory Visit (HOSPITAL_BASED_OUTPATIENT_CLINIC_OR_DEPARTMENT_OTHER): Payer: Medicare Other | Admitting: Internal Medicine

## 2016-04-17 ENCOUNTER — Telehealth: Payer: Self-pay | Admitting: Internal Medicine

## 2016-04-17 ENCOUNTER — Other Ambulatory Visit (HOSPITAL_BASED_OUTPATIENT_CLINIC_OR_DEPARTMENT_OTHER): Payer: Medicare Other

## 2016-04-17 VITALS — BP 127/67 | HR 87 | Temp 98.2°F | Resp 18 | Ht 62.0 in | Wt 137.8 lb

## 2016-04-17 DIAGNOSIS — Z5111 Encounter for antineoplastic chemotherapy: Secondary | ICD-10-CM | POA: Diagnosis not present

## 2016-04-17 DIAGNOSIS — C3492 Malignant neoplasm of unspecified part of left bronchus or lung: Principal | ICD-10-CM

## 2016-04-17 DIAGNOSIS — C3412 Malignant neoplasm of upper lobe, left bronchus or lung: Secondary | ICD-10-CM

## 2016-04-17 DIAGNOSIS — C3491 Malignant neoplasm of unspecified part of right bronchus or lung: Secondary | ICD-10-CM

## 2016-04-17 DIAGNOSIS — C3411 Malignant neoplasm of upper lobe, right bronchus or lung: Secondary | ICD-10-CM | POA: Diagnosis not present

## 2016-04-17 LAB — CBC WITH DIFFERENTIAL/PLATELET
BASO%: 0.5 % (ref 0.0–2.0)
BASOS ABS: 0 10*3/uL (ref 0.0–0.1)
EOS%: 1 % (ref 0.0–7.0)
Eosinophils Absolute: 0 10*3/uL (ref 0.0–0.5)
HEMATOCRIT: 37.3 % (ref 34.8–46.6)
HEMOGLOBIN: 12.2 g/dL (ref 11.6–15.9)
LYMPH#: 0.9 10*3/uL (ref 0.9–3.3)
LYMPH%: 29.3 % (ref 14.0–49.7)
MCH: 32.5 pg (ref 25.1–34.0)
MCHC: 32.8 g/dL (ref 31.5–36.0)
MCV: 99.1 fL (ref 79.5–101.0)
MONO#: 0.4 10*3/uL (ref 0.1–0.9)
MONO%: 13.8 % (ref 0.0–14.0)
NEUT%: 55.4 % (ref 38.4–76.8)
NEUTROS ABS: 1.8 10*3/uL (ref 1.5–6.5)
Platelets: 327 10*3/uL (ref 145–400)
RBC: 3.77 10*6/uL (ref 3.70–5.45)
RDW: 15.6 % — AB (ref 11.2–14.5)
WBC: 3.2 10*3/uL — ABNORMAL LOW (ref 3.9–10.3)

## 2016-04-17 LAB — COMPREHENSIVE METABOLIC PANEL
ALBUMIN: 3 g/dL — AB (ref 3.5–5.0)
ALK PHOS: 89 U/L (ref 40–150)
ALT: 9 U/L (ref 0–55)
AST: 20 U/L (ref 5–34)
Anion Gap: 8 mEq/L (ref 3–11)
BUN: 14.5 mg/dL (ref 7.0–26.0)
CALCIUM: 9.4 mg/dL (ref 8.4–10.4)
CO2: 23 mEq/L (ref 22–29)
CREATININE: 0.8 mg/dL (ref 0.6–1.1)
Chloride: 108 mEq/L (ref 98–109)
EGFR: 68 mL/min/{1.73_m2} — ABNORMAL LOW (ref >90)
Glucose: 85 mg/dl (ref 70–140)
Potassium: 4.4 mEq/L (ref 3.5–5.1)
Sodium: 140 mEq/L (ref 136–145)
TOTAL PROTEIN: 7.5 g/dL (ref 6.4–8.3)
Total Bilirubin: 0.49 mg/dL (ref 0.20–1.20)

## 2016-04-17 MED ORDER — PEMETREXED DISODIUM CHEMO INJECTION 500 MG
400.0000 mg/m2 | Freq: Once | INTRAVENOUS | Status: AC
  Administered 2016-04-17: 675 mg via INTRAVENOUS
  Filled 2016-04-17: qty 20

## 2016-04-17 MED ORDER — HEPARIN SOD (PORK) LOCK FLUSH 100 UNIT/ML IV SOLN
500.0000 [IU] | Freq: Once | INTRAVENOUS | Status: AC | PRN
  Administered 2016-04-17: 500 [IU]
  Filled 2016-04-17: qty 5

## 2016-04-17 MED ORDER — SODIUM CHLORIDE 0.9 % IJ SOLN
10.0000 mL | INTRAMUSCULAR | Status: DC | PRN
  Administered 2016-04-17: 10 mL
  Filled 2016-04-17: qty 10

## 2016-04-17 MED ORDER — SODIUM CHLORIDE 0.9 % IV SOLN
Freq: Once | INTRAVENOUS | Status: AC
  Administered 2016-04-17: 13:00:00 via INTRAVENOUS
  Filled 2016-04-17: qty 8

## 2016-04-17 MED ORDER — SODIUM CHLORIDE 0.9 % IV SOLN
Freq: Once | INTRAVENOUS | Status: AC
  Administered 2016-04-17: 12:00:00 via INTRAVENOUS

## 2016-04-17 NOTE — Progress Notes (Signed)
[place] [phone]   Fax:(336) [place], [place] [address]  Principle Diagnosis and stage: Recurrent non-small cell lung cancer, initially diagnosed a stage IB (T2, N0, M0) adenocarcinoma in June 2008 with a tumor size of 8 cm.   Prior Therapy:  #1 status post left upper lobectomy with lymph node dissection under the care of Dr. Arlyce Dice on 06/29/2006.  #2 status post 4 cycles of adjuvant chemotherapy with cisplatin and Taxotere last dose given 08/01/2006.  #3 status post 6 cycles of systemic chemotherapy with carboplatin and Alimta for disease recurrence last dose given 12/12/2009.  #4 palliative radiotherapy to the enlarging right upper lobe lung mass under the care of Dr. Pablo Ledger completed on 11/14/2015.  Current therapy: Maintenance chemotherapy with Alimta at 400 mg per meter squared given every 3 weeks status post 91 cycles.  CHEMOTHERAPY INTENT: Palliative/maintenance.  CURRENT # OF CHEMOTHERAPY CYCLES: 92  CURRENT ANTIEMETICS: Zofran, dexamethasone and Compazine  CURRENT SMOKING STATUS: Never Smoker.   ORAL CHEMOTHERAPY AND CONSENT: None   CURRENT BISPHOSPHONATES USE: None  PAIN MANAGEMENT: None  NARCOTICS INDUCED CONSTIPATION: None  LIVING WILL AND CODE STATUS: has living will for no CODE BLUE.  INTERVAL HISTORY: [name] 82 y.o. female returns to the clinic today for follow up visit. The patient is feeling well today with no specific complaints except for nasal congestion and mild cough but no hemoptysis. She also has mild fatigue. She tolerated the last cycle of her maintenance chemotherapy with Alimta fairly well. The patient denied having any significant nausea or vomiting. She has no fever or chills. The patient denied having any significant chest pain or shortness of breath. She has no weight loss or night sweats. She is here today to start cycle  #92.  MEDICAL HISTORY: Past Medical History:  Diagnosis Date  . FHx: chemotherapy 2008&2011   4 cycles cisplatin,taxotere,2008& carboplatin and Alimta 2011  . History of migraine headaches   . Hypercholesterolemia   . lung ca dx'd 06/2006   rt and lt lung  . Lung cancer ([place]) 3/14/1   right- Adenocarcinoma w/bronchioalveolar features  . Radiation 11/01/15-11/14/15   right upper lobe 30 gray  . Radiation pneumonitis ([place] [facility]) 01/24/2016    ALLERGIES:  is allergic to simvastatin and iodinated diagnostic agents.  MEDICATIONS:  Current Outpatient Prescriptions  Medication Sig Dispense Refill  . Calcium Carbonate-Vit D-Min (CALTRATE PLUS PO) Take 500 mg by mouth 2 (two) times daily. Reported on 12/13/2015    . chlorpheniramine-HYDROcodone (TUSSIONEX) 10-8 MG/5ML SUER Take 5 mLs by mouth 2 (two) times daily. 993 mL 0  . folic acid (FOLVITE) 1 MG tablet Take 1 mg by mouth daily. Reported on 12/13/2015    . Multiple Vitamins-Minerals (CENTRUM SILVER PO) Take 1 tablet by mouth daily. Reported on 12/13/2015    . prochlorperazine (COMPAZINE) 10 MG tablet Take 1 tablet (10 mg total) by mouth every 6 (six) hours as needed. 15 tablet 1  . vitamin B-12 (CYANOCOBALAMIN) 500 MCG tablet Take 1,500 mcg by mouth daily. Reported on 12/13/2015     No current facility-administered medications for this visit.     REVIEW OF SYSTEMS:  A comprehensive review of systems was negative except for: Constitutional: positive for fatigue Ears, nose, mouth, throat, and face: positive for nasal congestion Respiratory: positive for cough   PHYSICAL EXAMINATION: General appearance: alert, cooperative and no distress Head: Normocephalic, without obvious abnormality, atraumatic  Neck: no adenopathy Lymph nodes: Cervical, supraclavicular, and axillary nodes normal. Resp: clear to auscultation bilaterally Back: symmetric, no curvature. ROM normal. No CVA tenderness. Cardio: regular rate and rhythm, S1, S2 normal, no murmur,  click, rub or gallop GI: soft, non-tender; bowel sounds normal; no masses,  no organomegaly Extremities: extremities normal, atraumatic, no cyanosis or edema Neurologic: Alert and oriented X 3, normal strength and tone. Normal symmetric reflexes. Normal coordination and gait  ECOG PERFORMANCE STATUS: 1 - Symptomatic but completely ambulatory  Blood pressure 127/67, pulse 87, temperature 98.2 F (36.8 C), temperature source Oral, resp. rate 18, height '5\' 2"'$  (1.575 m), weight 137 lb 12.8 oz (62.5 kg), SpO2 98 %.  LABORATORY DATA: Lab Results  Component Value Date   WBC 3.2 (L) 04/17/2016   HGB 12.2 04/17/2016   HCT 37.3 04/17/2016   MCV 99.1 04/17/2016   PLT 327 04/17/2016      Chemistry      Component Value Date/Time   NA 140 03/27/2016 1111   K 4.0 03/27/2016 1111   CL 108 (H) 11/04/2012 0947   CO2 23 03/27/2016 1111   BUN 13.6 03/27/2016 1111   CREATININE 0.8 03/27/2016 1111      Component Value Date/Time   CALCIUM 9.2 03/27/2016 1111   ALKPHOS 92 03/27/2016 1111   AST 20 03/27/2016 1111   ALT 9 03/27/2016 1111   BILITOT 0.40 03/27/2016 1111       RADIOGRAPHIC STUDIES: No results found. ASSESSMENT AND PLAN: This is a very pleasant 82 years old white female with recurrent non-small cell lung cancer, adenocarcinoma. The patient is currently undergoing maintenance chemotherapy with single agent Alimta status post 91 cycles and she is tolerating it fairly well. She is also status post palliative radiotherapy to the enlarging right upper lobe lung mass. I recommended for her to continue her treatment with maintenance Alimta with cycle #92 today.  I would see her back for follow-up visit in 3 weeks for evaluation before starting cycle #93 with repeat blood work and CT scan of the chest for restaging of her disease. She was advised to call immediately if she has any concerning symptoms in the interval.  Disclaimer: This note was dictated with voice recognition software.  Similar sounding words can inadvertently be transcribed and may not be corrected upon review.  [name]., MD 04/17/16

## 2016-04-17 NOTE — Telephone Encounter (Signed)
Message sent to chemo scheduler to be added. Appointments scheduled per 04/17/16 los. AVS report and appointment schedule given to patient per 04/17/16 los.

## 2016-04-17 NOTE — Patient Instructions (Signed)
[facility] Discharge Instructions for Patients Receiving Chemotherapy  Today you received the following chemotherapy agents; Alimta.   To help prevent nausea and vomiting after your treatment, we encourage you to take your nausea medication as directed.    If you develop nausea and vomiting that is not controlled by your nausea medication, call the clinic.   BELOW ARE SYMPTOMS THAT SHOULD BE REPORTED IMMEDIATELY:  *FEVER GREATER THAN 100.5 F  *CHILLS WITH OR WITHOUT FEVER  NAUSEA AND VOMITING THAT IS NOT CONTROLLED WITH YOUR NAUSEA MEDICATION  *UNUSUAL SHORTNESS OF BREATH  *UNUSUAL BRUISING OR BLEEDING  TENDERNESS IN MOUTH AND THROAT WITH OR WITHOUT PRESENCE OF ULCERS  *URINARY PROBLEMS  *BOWEL PROBLEMS  UNUSUAL RASH Items with * indicate a potential emergency and should be followed up as soon as possible.  Feel free to call the clinic you have any questions or concerns. The clinic phone number is [phone].  Please show the CHEMO ALERT CARD at check-in to the Emergency Department and triage nurse.   

## 2016-04-18 ENCOUNTER — Telehealth: Payer: Self-pay | Admitting: *Deleted

## 2016-04-18 NOTE — Telephone Encounter (Signed)
Per LOS I have scheduled appts and notified the scheduler 

## 2016-04-19 ENCOUNTER — Other Ambulatory Visit: Payer: Self-pay | Admitting: Internal Medicine

## 2016-04-19 DIAGNOSIS — C3491 Malignant neoplasm of unspecified part of right bronchus or lung: Secondary | ICD-10-CM

## 2016-04-19 DIAGNOSIS — C3492 Malignant neoplasm of unspecified part of left bronchus or lung: Principal | ICD-10-CM

## 2016-04-22 ENCOUNTER — Other Ambulatory Visit: Payer: Self-pay | Admitting: Medical Oncology

## 2016-04-22 DIAGNOSIS — C3492 Malignant neoplasm of unspecified part of left bronchus or lung: Secondary | ICD-10-CM

## 2016-04-22 DIAGNOSIS — C3491 Malignant neoplasm of unspecified part of right bronchus or lung: Secondary | ICD-10-CM

## 2016-04-22 DIAGNOSIS — Z5111 Encounter for antineoplastic chemotherapy: Secondary | ICD-10-CM

## 2016-04-22 MED ORDER — HYDROCOD POLST-CPM POLST ER 10-8 MG/5ML PO SUER: 5.0000 mL | Freq: Two times a day (BID) | ORAL | 0 refills | Status: DC

## 2016-05-06 ENCOUNTER — Encounter (HOSPITAL_COMMUNITY): Payer: Self-pay

## 2016-05-06 ENCOUNTER — Ambulatory Visit (HOSPITAL_COMMUNITY)
Admission: RE | Admit: 2016-05-06 | Discharge: 2016-05-06 | Disposition: A | Discharge status: Home / Self Care | Payer: Medicare Other | Source: Ambulatory Visit | Attending: Internal Medicine | Admitting: Internal Medicine

## 2016-05-06 DIAGNOSIS — C3492 Malignant neoplasm of unspecified part of left bronchus or lung: Secondary | ICD-10-CM | POA: Insufficient documentation

## 2016-05-06 DIAGNOSIS — I700 Atherosclerosis of aorta: Secondary | ICD-10-CM | POA: Diagnosis not present

## 2016-05-06 DIAGNOSIS — I2510 Atherosclerotic heart disease of native coronary artery without angina pectoris: Secondary | ICD-10-CM | POA: Insufficient documentation

## 2016-05-06 DIAGNOSIS — C3491 Malignant neoplasm of unspecified part of right bronchus or lung: Secondary | ICD-10-CM | POA: Diagnosis not present

## 2016-05-08 ENCOUNTER — Ambulatory Visit (HOSPITAL_BASED_OUTPATIENT_CLINIC_OR_DEPARTMENT_OTHER): Payer: Medicare Other

## 2016-05-08 ENCOUNTER — Telehealth: Payer: Self-pay | Admitting: *Deleted

## 2016-05-08 ENCOUNTER — Telehealth: Payer: Self-pay | Admitting: Internal Medicine

## 2016-05-08 ENCOUNTER — Encounter: Payer: Self-pay | Admitting: Internal Medicine

## 2016-05-08 ENCOUNTER — Ambulatory Visit (HOSPITAL_BASED_OUTPATIENT_CLINIC_OR_DEPARTMENT_OTHER): Payer: Medicare Other | Admitting: Internal Medicine

## 2016-05-08 ENCOUNTER — Other Ambulatory Visit (HOSPITAL_BASED_OUTPATIENT_CLINIC_OR_DEPARTMENT_OTHER): Payer: Medicare Other

## 2016-05-08 DIAGNOSIS — C3492 Malignant neoplasm of unspecified part of left bronchus or lung: Principal | ICD-10-CM

## 2016-05-08 DIAGNOSIS — R05 Cough: Secondary | ICD-10-CM | POA: Diagnosis not present

## 2016-05-08 DIAGNOSIS — D701 Agranulocytosis secondary to cancer chemotherapy: Secondary | ICD-10-CM | POA: Diagnosis not present

## 2016-05-08 DIAGNOSIS — Z85118 Personal history of other malignant neoplasm of bronchus and lung: Secondary | ICD-10-CM | POA: Diagnosis not present

## 2016-05-08 DIAGNOSIS — C3411 Malignant neoplasm of upper lobe, right bronchus or lung: Secondary | ICD-10-CM | POA: Diagnosis not present

## 2016-05-08 DIAGNOSIS — Z5111 Encounter for antineoplastic chemotherapy: Secondary | ICD-10-CM | POA: Diagnosis not present

## 2016-05-08 DIAGNOSIS — C3491 Malignant neoplasm of unspecified part of right bronchus or lung: Secondary | ICD-10-CM

## 2016-05-08 LAB — COMPREHENSIVE METABOLIC PANEL
ALBUMIN: 3.1 g/dL — AB (ref 3.5–5.0)
ALK PHOS: 95 U/L (ref 40–150)
ALT: 9 U/L (ref 0–55)
AST: 21 U/L (ref 5–34)
Anion Gap: 10 mEq/L (ref 3–11)
BILIRUBIN TOTAL: 0.5 mg/dL (ref 0.20–1.20)
BUN: 16.9 mg/dL (ref 7.0–26.0)
CALCIUM: 9.2 mg/dL (ref 8.4–10.4)
CO2: 23 mEq/L (ref 22–29)
Chloride: 107 mEq/L (ref 98–109)
Creatinine: 0.8 mg/dL (ref 0.6–1.1)
EGFR: 69 mL/min/{1.73_m2} — AB (ref >90)
GLUCOSE: 90 mg/dL (ref 70–140)
POTASSIUM: 3.6 meq/L (ref 3.5–5.1)
SODIUM: 140 meq/L (ref 136–145)
TOTAL PROTEIN: 7.8 g/dL (ref 6.4–8.3)

## 2016-05-08 LAB — CBC WITH DIFFERENTIAL/PLATELET
BASO%: 0.6 % (ref 0.0–2.0)
BASOS ABS: 0 10*3/uL (ref 0.0–0.1)
EOS ABS: 0.1 10*3/uL (ref 0.0–0.5)
EOS%: 1.5 % (ref 0.0–7.0)
HEMATOCRIT: 39.4 % (ref 34.8–46.6)
HEMOGLOBIN: 12.7 g/dL (ref 11.6–15.9)
LYMPH#: 1.3 10*3/uL (ref 0.9–3.3)
LYMPH%: 35.5 % (ref 14.0–49.7)
MCH: 31.8 pg (ref 25.1–34.0)
MCHC: 32.2 g/dL (ref 31.5–36.0)
MCV: 98.9 fL (ref 79.5–101.0)
MONO#: 0.4 10*3/uL (ref 0.1–0.9)
MONO%: 12.5 % (ref 0.0–14.0)
NEUT%: 49.9 % (ref 38.4–76.8)
NEUTROS ABS: 1.8 10*3/uL (ref 1.5–6.5)
PLATELETS: 333 10*3/uL (ref 145–400)
RBC: 3.99 10*6/uL (ref 3.70–5.45)
RDW: 15 % — AB (ref 11.2–14.5)
WBC: 3.6 10*3/uL — AB (ref 3.9–10.3)

## 2016-05-08 MED ORDER — HYDROCOD POLST-CPM POLST ER 10-8 MG/5ML PO SUER: 5.0000 mL | Freq: Two times a day (BID) | ORAL | 0 refills | Status: DC

## 2016-05-08 MED ORDER — HEPARIN SOD (PORK) LOCK FLUSH 100 UNIT/ML IV SOLN
500.0000 [IU] | Freq: Once | INTRAVENOUS | Status: AC | PRN
  Administered 2016-05-08: 500 [IU]
  Filled 2016-05-08: qty 5

## 2016-05-08 MED ORDER — DEXAMETHASONE SODIUM PHOSPHATE 100 MG/10ML IJ SOLN
Freq: Once | INTRAMUSCULAR | Status: AC
  Administered 2016-05-08: 13:00:00 via INTRAVENOUS
  Filled 2016-05-08: qty 8

## 2016-05-08 MED ORDER — CYANOCOBALAMIN 1000 MCG/ML IJ SOLN
INTRAMUSCULAR | Status: AC
  Filled 2016-05-08: qty 1

## 2016-05-08 MED ORDER — PEMETREXED DISODIUM CHEMO INJECTION 500 MG
400.0000 mg/m2 | Freq: Once | INTRAVENOUS | Status: AC
  Administered 2016-05-08: 675 mg via INTRAVENOUS
  Filled 2016-05-08: qty 20

## 2016-05-08 MED ORDER — SODIUM CHLORIDE 0.9 % IV SOLN
Freq: Once | INTRAVENOUS | Status: AC
  Administered 2016-05-08: 13:00:00 via INTRAVENOUS

## 2016-05-08 MED ORDER — CYANOCOBALAMIN 1000 MCG/ML IJ SOLN
1000.0000 ug | Freq: Once | INTRAMUSCULAR | Status: AC
  Administered 2016-05-08: 1000 ug via INTRAMUSCULAR

## 2016-05-08 MED ORDER — SODIUM CHLORIDE 0.9 % IJ SOLN
10.0000 mL | INTRAMUSCULAR | Status: DC | PRN
  Administered 2016-05-08: 10 mL
  Filled 2016-05-08: qty 10

## 2016-05-08 NOTE — Telephone Encounter (Signed)
Per LOS I have scheduled appts and notified the scheduler 

## 2016-05-08 NOTE — Telephone Encounter (Signed)
Appointments scheduled per 12/6 LOS. Patient given AVS report and calendars with future scheduled appointments. °

## 2016-05-08 NOTE — Patient Instructions (Signed)
[facility] Discharge Instructions for Patients Receiving Chemotherapy  Today you received the following chemotherapy agents; Alimta.   To help prevent nausea and vomiting after your treatment, we encourage you to take your nausea medication as directed.    If you develop nausea and vomiting that is not controlled by your nausea medication, call the clinic.   BELOW ARE SYMPTOMS THAT SHOULD BE REPORTED IMMEDIATELY:  *FEVER GREATER THAN 100.5 F  *CHILLS WITH OR WITHOUT FEVER  NAUSEA AND VOMITING THAT IS NOT CONTROLLED WITH YOUR NAUSEA MEDICATION  *UNUSUAL SHORTNESS OF BREATH  *UNUSUAL BRUISING OR BLEEDING  TENDERNESS IN MOUTH AND THROAT WITH OR WITHOUT PRESENCE OF ULCERS  *URINARY PROBLEMS  *BOWEL PROBLEMS  UNUSUAL RASH Items with * indicate a potential emergency and should be followed up as soon as possible.  Feel free to call the clinic you have any questions or concerns. The clinic phone number is [phone].  Please show the CHEMO ALERT CARD at check-in to the Emergency Department and triage nurse.   

## 2016-05-08 NOTE — Progress Notes (Signed)
[place] [phone]   Fax:(336) [place], [place] [address]  DIAGNOSIS: Recurrent non-small cell lung cancer, adenocarcinoma initially diagnosed as stage IB (T2, N0, M0) in June 2008 with tumor size of 8.0 cm.  PRIOR THERAPY: #1 status post left upper lobectomy with lymph node dissection under the care of Dr. Arlyce Dice on 06/29/2006.  #2 status post 4 cycles of adjuvant chemotherapy with cisplatin and Taxotere last dose given 08/01/2006.  #3 status post 6 cycles of systemic chemotherapy with carboplatin and Alimta for disease recurrence last dose given 12/12/2009.  #4 palliative radiotherapy to the enlarging right upper lobe lung mass under the care of Dr. Pablo Ledger completed on 11/14/2015.  CURRENT THERAPY: Maintenance systemic chemotherapy with single agent Alimta 400 MG/M2 every 3 weeks, status post 92 cycles.  INTERVAL HISTORY: [name] 82 y.o. female returns to the clinic today for follow-up visit. The patient spent her Thanksgiving with her daughter at the beach. She enjoyed her times a day. She is complaining of dry cough that was getting worse recently. She denied having any shortness of breath except with exertion. She has no chest pain or hemoptysis. She denied having any significant weight loss or night sweats. She has no fever or chills. She has no bleeding issues. She had repeat CT scan of the chest performed yesterday and she is here for evaluation and discussion of her scan results.  MEDICAL HISTORY: Past Medical History:  Diagnosis Date  . FHx: chemotherapy 2008&2011   4 cycles cisplatin,taxotere,2008& carboplatin and Alimta 2011  . History of migraine headaches   . Hypercholesterolemia   . lung ca dx'd 06/2006   rt and lt lung  . Lung cancer ([place]) 3/14/1   right- Adenocarcinoma w/bronchioalveolar features  . Radiation 11/01/15-11/14/15   right upper lobe 30 gray  .  Radiation pneumonitis ([place]) 01/24/2016    ALLERGIES:  is allergic to simvastatin and iodinated diagnostic agents.  MEDICATIONS:  Current Outpatient Prescriptions  Medication Sig Dispense Refill  . Calcium Carbonate-Vit D-Min (CALTRATE PLUS PO) Take 500 mg by mouth 2 (two) times daily. Reported on 12/13/2015    . chlorpheniramine-HYDROcodone (TUSSIONEX) 10-8 MG/5ML SUER Take 5 mLs by mouth 2 (two) times daily. 371 mL 0  . folic acid (FOLVITE) 1 MG tablet Take 1 mg by mouth daily. Reported on 12/13/2015    . Multiple Vitamins-Minerals (CENTRUM SILVER PO) Take 1 tablet by mouth daily. Reported on 12/13/2015    . prochlorperazine (COMPAZINE) 10 MG tablet Take 1 tablet (10 mg total) by mouth every 6 (six) hours as needed. 15 tablet 1  . vitamin B-12 (CYANOCOBALAMIN) 500 MCG tablet Take 1,500 mcg by mouth daily. Reported on 12/13/2015     No current facility-administered medications for this visit.     SURGICAL HISTORY:  Past Surgical History:  Procedure Laterality Date  . CATARACT EXTRACTION  2013   bilateral  . left  lowerlung lobectomy Left 06/19/2006   Dr.Burney,Left lower lobectomy  . Porta-cath  2011    REVIEW OF SYSTEMS:  Constitutional: positive for fatigue Eyes: negative Ears, nose, mouth, throat, and face: negative Respiratory: positive for cough and dyspnea on exertion Cardiovascular: negative for near-syncope, orthopnea and palpitations Gastrointestinal: negative for constipation, diarrhea, nausea and vomiting Genitourinary:negative for dysuria, frequency and hematuria Integument/breast: negative for dryness and rash Hematologic/lymphatic: negative for bleeding and easy bruising Musculoskeletal:negative Neurological: negative Behavioral/Psych: negative Endocrine: negative Allergic/Immunologic: negative  PHYSICAL EXAMINATION: General appearance: alert, cooperative, fatigued and no distress Head: Normocephalic, without obvious abnormality, atraumatic Neck: no adenopathy,  no JVD, supple, symmetrical, trachea midline and thyroid not enlarged, symmetric, no tenderness/mass/nodules Lymph nodes: Cervical, supraclavicular, and axillary nodes normal. Resp: wheezes bilaterally Back: symmetric, no curvature. ROM normal. No CVA tenderness. Cardio: regular rate and rhythm, S1, S2 normal, no murmur, click, rub or gallop and normal apical impulse GI: soft, non-tender; bowel sounds normal; no masses,  no organomegaly Extremities: extremities normal, atraumatic, no cyanosis or edema Neurologic: Alert and oriented X 3, normal strength and tone. Normal symmetric reflexes. Normal coordination and gait  ECOG PERFORMANCE STATUS: 1 - Symptomatic but completely ambulatory  Blood pressure 132/68, pulse 96, temperature 98.2 F (36.8 C), temperature source Oral, resp. rate 18, height '5\' 2"'$  (1.575 m), weight 137 lb 4.8 oz (62.3 kg), SpO2 100 %.  LABORATORY DATA: Lab Results  Component Value Date   WBC 3.6 (L) 05/08/2016   HGB 12.7 05/08/2016   HCT 39.4 05/08/2016   MCV 98.9 05/08/2016   PLT 333 05/08/2016      Chemistry      Component Value Date/Time   NA 140 05/08/2016 1115   K 3.6 05/08/2016 1115   CL 108 (H) 11/04/2012 0947   CO2 23 05/08/2016 1115   BUN 16.9 05/08/2016 1115   CREATININE 0.8 05/08/2016 1115      Component Value Date/Time   CALCIUM 9.2 05/08/2016 1115   ALKPHOS 95 05/08/2016 1115   AST 21 05/08/2016 1115   ALT 9 05/08/2016 1115   BILITOT 0.50 05/08/2016 1115       RADIOGRAPHIC STUDIES: Ct Chest Wo Contrast  Result Date: 05/06/2016 CLINICAL DATA:  82 year old female with history of lung cancer. Chemotherapy ongoing. Radiation therapy complete in July 2017. EXAM: CT CHEST WITHOUT CONTRAST TECHNIQUE: Multidetector CT imaging of the chest was performed following the standard protocol without IV contrast. COMPARISON:  Chest CT 01/23/2016. FINDINGS: Cardiovascular: Heart size is normal. There is no significant pericardial fluid, thickening or  pericardial calcification. There is aortic atherosclerosis, as well as atherosclerosis of the great vessels of the mediastinum and the coronary arteries, including calcified atherosclerotic plaque in the left main, left anterior descending and right coronary arteries. Calcifications of the aortic valve. Right internal jugular single-lumen porta cath with tip terminating at the superior cavoatrial junction. Mediastinum/Nodes: Fullness in the right hilar region (underlying lymphadenopathy is not excluded, but is poorly evaluated on today's noncontrast CT examination). No definite mediastinal or left hilar lymphadenopathy. Esophagus is unremarkable in appearance. No axillary lymphadenopathy. Lungs/Pleura: There continues to be extensive mass-like architectural distortion throughout the right mid to upper lung with internal areas of postradiation bronchiectasis, chronic consolidation and extensive septal thickening and overlying pleural thickening. The overall appearance is slightly more contracted than the recent prior study with last ground-glass attenuation and more solid appearance, suggestive of progressive postradiation fibrosis. Small areas of evolving fibrosis also noted in the medial segment of the right middle lobe and lateral segment of the right middle lobe, similar to the prior study. Previously noted nodular area of architectural distortion in the lingula is similar in size to the prior study measuring 1.7 cm on today's examination (image 84 of series 4). 5 mm subpleural nodule in the periphery of the left upper lobe (image 53 of series 4) is unchanged. No other new suspicious appearing pulmonary nodules or masses are noted. Trace chronic right pleural effusion is unchanged, loculated in the posterior aspect of the right upper  hemithorax. No left pleural effusion. Upper Abdomen: Aortic atherosclerosis. Musculoskeletal: There are no aggressive appearing lytic or blastic lesions noted in the visualized  portions of the skeleton. IMPRESSION: 1. Overall, today's study appears to demonstrate expected evolution of postradiation changes in the right hemithorax, as above, without compelling evidence to suggest local recurrence of disease. 2. Previously noted left upper lobe pulmonary nodule is essentially unchanged compared to the prior study. 3. Trace residual partially loculated right-sided pleural effusion has decreased in size compared to the prior examination. 4. Aortic atherosclerosis, in addition to left main and 2 vessel coronary artery disease. 5. Additional incidental findings, as above. Electronically Signed   By: Vinnie Langton M.D.   On: 05/06/2016 16:26    ASSESSMENT AND PLAN: This is a very pleasant 82 years old white female with: 1) recurrent non-small cell lung cancer, adenocarcinoma: She is currently undergoing maintenance systemic chemotherapy with single agent Alimta status post 92 cycles. She is still awaiting the treatment well with no significant adverse effects except for mild fatigue. The patient had repeat CT scan of the chest performed yesterday. I personally and independently reviewed the scan images and discussed it with the patient today. It showed no evidence for disease progression. I recommended for her to continue her treatment with single agent Alimta as a scheduled and she will receive cycle #93 today. 2) dry cough: Likely secondary to radiation pneumonitis. I offered the patient treatment with steroids but she would like to hold on this option for now. I gave her refill of test in next 5 ML by mouth every 12 hours. 3) chemotherapy-induced neutropenia: We will monitor for now. She will come back for follow-up visit in 3 weeks for evaluation with the start of cycle #94. She was advised to call immediately if she has any concerning symptoms in the interval. The patient voices understanding of current disease status and treatment options and is in agreement with the current  care plan.  All questions were answered. The patient knows to call the clinic with any problems, questions or concerns. We can certainly see the patient much sooner if necessary.  Disclaimer: This note was dictated with voice recognition software. Similar sounding words can inadvertently be transcribed and may not be corrected upon review.

## 2016-05-29 ENCOUNTER — Telehealth: Payer: Self-pay | Admitting: Internal Medicine

## 2016-05-29 ENCOUNTER — Other Ambulatory Visit (HOSPITAL_BASED_OUTPATIENT_CLINIC_OR_DEPARTMENT_OTHER): Payer: Medicare Other

## 2016-05-29 ENCOUNTER — Ambulatory Visit (HOSPITAL_BASED_OUTPATIENT_CLINIC_OR_DEPARTMENT_OTHER): Payer: Medicare Other | Admitting: Internal Medicine

## 2016-05-29 ENCOUNTER — Encounter: Payer: Self-pay | Admitting: Internal Medicine

## 2016-05-29 ENCOUNTER — Ambulatory Visit (HOSPITAL_BASED_OUTPATIENT_CLINIC_OR_DEPARTMENT_OTHER): Payer: Medicare Other

## 2016-05-29 VITALS — BP 110/80 | HR 88 | Temp 98.3°F | Resp 18 | Ht 62.0 in | Wt 138.4 lb

## 2016-05-29 DIAGNOSIS — C3492 Malignant neoplasm of unspecified part of left bronchus or lung: Principal | ICD-10-CM

## 2016-05-29 DIAGNOSIS — R5383 Other fatigue: Secondary | ICD-10-CM

## 2016-05-29 DIAGNOSIS — C3491 Malignant neoplasm of unspecified part of right bronchus or lung: Secondary | ICD-10-CM

## 2016-05-29 DIAGNOSIS — R05 Cough: Secondary | ICD-10-CM | POA: Diagnosis not present

## 2016-05-29 DIAGNOSIS — Z5111 Encounter for antineoplastic chemotherapy: Secondary | ICD-10-CM

## 2016-05-29 DIAGNOSIS — C3411 Malignant neoplasm of upper lobe, right bronchus or lung: Secondary | ICD-10-CM

## 2016-05-29 DIAGNOSIS — J700 Acute pulmonary manifestations due to radiation: Secondary | ICD-10-CM

## 2016-05-29 LAB — COMPREHENSIVE METABOLIC PANEL
ALT: 8 U/L (ref 0–55)
ANION GAP: 8 meq/L (ref 3–11)
AST: 22 U/L (ref 5–34)
Albumin: 3.1 g/dL — ABNORMAL LOW (ref 3.5–5.0)
Alkaline Phosphatase: 90 U/L (ref 40–150)
BILIRUBIN TOTAL: 0.53 mg/dL (ref 0.20–1.20)
BUN: 17.6 mg/dL (ref 7.0–26.0)
CALCIUM: 9 mg/dL (ref 8.4–10.4)
CO2: 23 meq/L (ref 22–29)
CREATININE: 0.8 mg/dL (ref 0.6–1.1)
Chloride: 109 mEq/L (ref 98–109)
EGFR: 68 mL/min/{1.73_m2} — ABNORMAL LOW (ref >90)
Glucose: 94 mg/dl (ref 70–140)
Potassium: 4.2 mEq/L (ref 3.5–5.1)
Sodium: 140 mEq/L (ref 136–145)
TOTAL PROTEIN: 7.4 g/dL (ref 6.4–8.3)

## 2016-05-29 LAB — CBC WITH DIFFERENTIAL/PLATELET
BASO%: 0.3 % (ref 0.0–2.0)
Basophils Absolute: 0 10*3/uL (ref 0.0–0.1)
EOS%: 1.4 % (ref 0.0–7.0)
Eosinophils Absolute: 0.1 10*3/uL (ref 0.0–0.5)
HEMATOCRIT: 37.3 % (ref 34.8–46.6)
HGB: 12.1 g/dL (ref 11.6–15.9)
LYMPH#: 1.1 10*3/uL (ref 0.9–3.3)
LYMPH%: 30.5 % (ref 14.0–49.7)
MCH: 32.1 pg (ref 25.1–34.0)
MCHC: 32.4 g/dL (ref 31.5–36.0)
MCV: 98.9 fL (ref 79.5–101.0)
MONO#: 0.4 10*3/uL (ref 0.1–0.9)
MONO%: 12 % (ref 0.0–14.0)
NEUT%: 55.8 % (ref 38.4–76.8)
NEUTROS ABS: 2.1 10*3/uL (ref 1.5–6.5)
PLATELETS: 329 10*3/uL (ref 145–400)
RBC: 3.77 10*6/uL (ref 3.70–5.45)
RDW: 15.1 % — ABNORMAL HIGH (ref 11.2–14.5)
WBC: 3.7 10*3/uL — AB (ref 3.9–10.3)

## 2016-05-29 MED ORDER — SODIUM CHLORIDE 0.9 % IJ SOLN
10.0000 mL | INTRAMUSCULAR | Status: DC | PRN
  Administered 2016-05-29: 10 mL
  Filled 2016-05-29: qty 10

## 2016-05-29 MED ORDER — SODIUM CHLORIDE 0.9 % IV SOLN
400.0000 mg/m2 | Freq: Once | INTRAVENOUS | Status: AC
  Administered 2016-05-29: 675 mg via INTRAVENOUS
  Filled 2016-05-29: qty 7

## 2016-05-29 MED ORDER — HEPARIN SOD (PORK) LOCK FLUSH 100 UNIT/ML IV SOLN
500.0000 [IU] | Freq: Once | INTRAVENOUS | Status: AC | PRN
  Administered 2016-05-29: 500 [IU]
  Filled 2016-05-29: qty 5

## 2016-05-29 MED ORDER — SODIUM CHLORIDE 0.9 % IV SOLN
Freq: Once | INTRAVENOUS | Status: AC
  Administered 2016-05-29: 12:00:00 via INTRAVENOUS

## 2016-05-29 MED ORDER — DEXAMETHASONE SODIUM PHOSPHATE 100 MG/10ML IJ SOLN
Freq: Once | INTRAMUSCULAR | Status: AC
  Administered 2016-05-29: 12:00:00 via INTRAVENOUS
  Filled 2016-05-29: qty 8

## 2016-05-29 NOTE — Progress Notes (Signed)
[facility] Telephone:([phone]   Fax:(336) [place], MD [place] [address]  DIAGNOSIS: Recurrent non-small cell lung cancer, adenocarcinoma initially diagnosed as stage IB (T2, N0, M0) in June 2008 with tumor size of 8.0 cm.  PRIOR THERAPY: #1 status post left upper lobectomy with lymph node dissection under the care of Dr. Arlyce Dice on 06/29/2006.  #2 status post 4 cycles of adjuvant chemotherapy with cisplatin and Taxotere last dose given 08/01/2006.  #3 status post 6 cycles of systemic chemotherapy with carboplatin and Alimta for disease recurrence last dose given 12/12/2009.  #4 palliative radiotherapy to the enlarging right upper lobe lung mass under the care of Dr. Pablo Ledger completed on 11/14/2015.  CURRENT THERAPY: Maintenance systemic chemotherapy with single agent Alimta 400 MG/M2 every 3 weeks, status post 93 cycles.  INTERVAL HISTORY: [name] 82 y.o. female came to the clinic today for follow-up visit. The patient enjoyed her Christmas time with her [place] at the beach. She is feeling much better today except for the dry cough from the previous radiotherapy. She has no fever or chills. She denied having any chest pain, shortness breath or hemoptysis. She has no significant weight loss or night sweats. She is here today for evaluation before starting cycle #94 of her treatment.  MEDICAL HISTORY: Past Medical History:  Diagnosis Date  . FHx: chemotherapy 2008&2011   4 cycles cisplatin,taxotere,2008& carboplatin and Alimta 2011  . History of migraine headaches   . Hypercholesterolemia   . lung ca dx'd 06/2006   rt and lt lung  . Lung cancer ([place]) 3/14/1   right- Adenocarcinoma w/bronchioalveolar features  . Radiation 11/01/15-11/14/15   right upper lobe 30 gray  . Radiation pneumonitis ([place]) 01/24/2016    ALLERGIES:  is allergic to simvastatin and iodinated diagnostic  agents.  MEDICATIONS:  Current Outpatient Prescriptions  Medication Sig Dispense Refill  . Calcium Carbonate-Vit D-Min (CALTRATE PLUS PO) Take 500 mg by mouth 2 (two) times daily. Reported on 12/13/2015    . chlorpheniramine-HYDROcodone (TUSSIONEX) 10-8 MG/5ML SUER Take 5 mLs by mouth 2 (two) times daily. 811 mL 0  . folic acid (FOLVITE) 1 MG tablet Take 1 mg by mouth daily. Reported on 12/13/2015    . Multiple Vitamins-Minerals (CENTRUM SILVER PO) Take 1 tablet by mouth daily. Reported on 12/13/2015    . vitamin B-12 (CYANOCOBALAMIN) 500 MCG tablet Take 1,500 mcg by mouth daily. Reported on 12/13/2015    . prochlorperazine (COMPAZINE) 10 MG tablet Take 1 tablet (10 mg total) by mouth every 6 (six) hours as needed. (Patient not taking: Reported on 05/29/2016) 15 tablet 1   No current facility-administered medications for this visit.     SURGICAL HISTORY:  Past Surgical History:  Procedure Laterality Date  . CATARACT EXTRACTION  2013   bilateral  . left  lowerlung lobectomy Left 06/19/2006   Dr.Burney,Left lower lobectomy  . Porta-cath  2011    REVIEW OF SYSTEMS:  A comprehensive review of systems was negative except for: Respiratory: positive for cough   PHYSICAL EXAMINATION: General appearance: alert, cooperative and no distress Head: Normocephalic, without obvious abnormality, atraumatic Neck: no adenopathy, no JVD, supple, symmetrical, trachea midline and thyroid not enlarged, symmetric, no tenderness/mass/nodules Lymph nodes: Cervical, supraclavicular, and axillary nodes normal. Resp: wheezes bilaterally Back: symmetric, no curvature. ROM normal. No CVA tenderness. Cardio: regular rate and rhythm, S1, S2 normal, no murmur, click, rub or gallop and normal apical  impulse GI: soft, non-tender; bowel sounds normal; no masses,  no organomegaly Extremities: extremities normal, atraumatic, no cyanosis or edema  ECOG PERFORMANCE STATUS: 1 - Symptomatic but completely ambulatory  Blood  pressure 110/80, pulse 88, temperature 98.3 F (36.8 C), temperature source Oral, resp. rate 18, height '5\' 2"'$  (1.575 m), weight 138 lb 6.4 oz (62.8 kg), SpO2 100 %.  LABORATORY DATA: Lab Results  Component Value Date   WBC 3.7 (L) 05/29/2016   HGB 12.1 05/29/2016   HCT 37.3 05/29/2016   MCV 98.9 05/29/2016   PLT 329 05/29/2016      Chemistry      Component Value Date/Time   NA 140 05/29/2016 0943   K 4.2 05/29/2016 0943   CL 108 (H) 11/04/2012 0947   CO2 23 05/29/2016 0943   BUN 17.6 05/29/2016 0943   CREATININE 0.8 05/29/2016 0943      Component Value Date/Time   CALCIUM 9.0 05/29/2016 0943   ALKPHOS 90 05/29/2016 0943   AST 22 05/29/2016 0943   ALT 8 05/29/2016 0943   BILITOT 0.53 05/29/2016 0943       RADIOGRAPHIC STUDIES: Ct Chest Wo Contrast  Result Date: 05/06/2016 CLINICAL DATA:  82 year old female with history of lung cancer. Chemotherapy ongoing. Radiation therapy complete in July 2017. EXAM: CT CHEST WITHOUT CONTRAST TECHNIQUE: Multidetector CT imaging of the chest was performed following the standard protocol without IV contrast. COMPARISON:  Chest CT 01/23/2016. FINDINGS: Cardiovascular: Heart size is normal. There is no significant pericardial fluid, thickening or pericardial calcification. There is aortic atherosclerosis, as well as atherosclerosis of the great vessels of the mediastinum and the coronary arteries, including calcified atherosclerotic plaque in the left main, left anterior descending and right coronary arteries. Calcifications of the aortic valve. Right internal jugular single-lumen porta cath with tip terminating at the superior cavoatrial junction. Mediastinum/Nodes: Fullness in the right hilar region (underlying lymphadenopathy is not excluded, but is poorly evaluated on today's noncontrast CT examination). No definite mediastinal or left hilar lymphadenopathy. Esophagus is unremarkable in appearance. No axillary lymphadenopathy. Lungs/Pleura: There  continues to be extensive mass-like architectural distortion throughout the right mid to upper lung with internal areas of postradiation bronchiectasis, chronic consolidation and extensive septal thickening and overlying pleural thickening. The overall appearance is slightly more contracted than the recent prior study with last ground-glass attenuation and more solid appearance, suggestive of progressive postradiation fibrosis. Small areas of evolving fibrosis also noted in the medial segment of the right middle lobe and lateral segment of the right middle lobe, similar to the prior study. Previously noted nodular area of architectural distortion in the lingula is similar in size to the prior study measuring 1.7 cm on today's examination (image 84 of series 4). 5 mm subpleural nodule in the periphery of the left upper lobe (image 53 of series 4) is unchanged. No other new suspicious appearing pulmonary nodules or masses are noted. Trace chronic right pleural effusion is unchanged, loculated in the posterior aspect of the right upper hemithorax. No left pleural effusion. Upper Abdomen: Aortic atherosclerosis. Musculoskeletal: There are no aggressive appearing lytic or blastic lesions noted in the visualized portions of the skeleton. IMPRESSION: 1. Overall, today's study appears to demonstrate expected evolution of postradiation changes in the right hemithorax, as above, without compelling evidence to suggest local recurrence of disease. 2. Previously noted left upper lobe pulmonary nodule is essentially unchanged compared to the prior study. 3. Trace residual partially loculated right-sided pleural effusion has decreased in size compared to the  prior examination. 4. Aortic atherosclerosis, in addition to left main and 2 vessel coronary artery disease. 5. Additional incidental findings, as above. Electronically Signed   By: Vinnie Langton M.D.   On: 05/06/2016 16:26    ASSESSMENT AND PLAN:  This is a very  pleasant 82 years old white female with recurrent non-small cell lung cancer, adenocarcinoma. She status post induction systemic chemotherapy was carboplatin and Alimta and she is currently on maintenance treatment with single agent Alimta status post 93 cycles. She is tolerating her treatment well except for fatigue and dry cough from the previous palliative radiotherapy. I recommended for the patient to proceed with cycle #94 today as scheduled. For the dry cough, she will continue with Tussionex as needed.. The patient would come back for follow-up visit in 3 weeks for evaluation before starting the next cycle of her treatment. She was advised to call immediately if she has any concerning symptoms in the interval. The patient voices understanding of current disease status and treatment options and is in agreement with the current care plan.  All questions were answered. The patient knows to call the clinic with any problems, questions or concerns. We can certainly see the patient much sooner if necessary. I spent 10 minutes counseling the patient face to face. The total time spent in the appointment was 15 minutes.  Disclaimer: This note was dictated with voice recognition software. Similar sounding words can inadvertently be transcribed and may not be corrected upon review.

## 2016-05-29 NOTE — Patient Instructions (Signed)
[facility] Discharge Instructions for Patients Receiving Chemotherapy  Today you received the following chemotherapy agents Alimta.  To help prevent nausea and vomiting after your treatment, we encourage you to take your nausea medication as prescribed.   If you develop nausea and vomiting that is not controlled by your nausea medication, call the clinic.   BELOW ARE SYMPTOMS THAT SHOULD BE REPORTED IMMEDIATELY:  *FEVER GREATER THAN 100.5 F  *CHILLS WITH OR WITHOUT FEVER  NAUSEA AND VOMITING THAT IS NOT CONTROLLED WITH YOUR NAUSEA MEDICATION  *UNUSUAL SHORTNESS OF BREATH  *UNUSUAL BRUISING OR BLEEDING  TENDERNESS IN MOUTH AND THROAT WITH OR WITHOUT PRESENCE OF ULCERS  *URINARY PROBLEMS  *BOWEL PROBLEMS  UNUSUAL RASH Items with * indicate a potential emergency and should be followed up as soon as possible.  Feel free to call the clinic you have any questions or concerns. The clinic phone number is [phone].  Please show the CHEMO ALERT CARD at check-in to the Emergency Department and triage nurse.   

## 2016-05-29 NOTE — Telephone Encounter (Signed)
Appointments scheduled throughout march 2018. Patient was given a copy of the AVS report and appointment schedule, per 05/29/16 los.

## 2016-06-18 ENCOUNTER — Telehealth: Payer: Self-pay | Admitting: Medical Oncology

## 2016-06-18 NOTE — Telephone Encounter (Signed)
Cancelled all appts for tomorrow due to weather, She confirmed feb appts.

## 2016-06-19 ENCOUNTER — Ambulatory Visit: Payer: Medicare Other | Admitting: Internal Medicine

## 2016-06-19 ENCOUNTER — Other Ambulatory Visit: Payer: Medicare Other

## 2016-06-19 ENCOUNTER — Ambulatory Visit: Payer: Medicare Other

## 2016-06-21 ENCOUNTER — Other Ambulatory Visit: Payer: Self-pay | Admitting: Medical Oncology

## 2016-06-21 DIAGNOSIS — C3491 Malignant neoplasm of unspecified part of right bronchus or lung: Secondary | ICD-10-CM

## 2016-06-21 DIAGNOSIS — C3492 Malignant neoplasm of unspecified part of left bronchus or lung: Secondary | ICD-10-CM

## 2016-06-21 DIAGNOSIS — Z5111 Encounter for antineoplastic chemotherapy: Secondary | ICD-10-CM

## 2016-06-21 MED ORDER — HYDROCOD POLST-CPM POLST ER 10-8 MG/5ML PO SUER: 5.0000 mL | Freq: Two times a day (BID) | ORAL | 0 refills | Status: DC

## 2016-06-25 HISTORY — PX: MYRINGOTOMY: SUR874

## 2016-07-10 ENCOUNTER — Other Ambulatory Visit (HOSPITAL_BASED_OUTPATIENT_CLINIC_OR_DEPARTMENT_OTHER): Payer: Medicare Other

## 2016-07-10 ENCOUNTER — Telehealth: Payer: Self-pay | Admitting: Internal Medicine

## 2016-07-10 ENCOUNTER — Ambulatory Visit (HOSPITAL_BASED_OUTPATIENT_CLINIC_OR_DEPARTMENT_OTHER): Payer: Medicare Other

## 2016-07-10 ENCOUNTER — Encounter: Payer: Self-pay | Admitting: Internal Medicine

## 2016-07-10 ENCOUNTER — Ambulatory Visit (HOSPITAL_BASED_OUTPATIENT_CLINIC_OR_DEPARTMENT_OTHER): Payer: Medicare Other | Admitting: Internal Medicine

## 2016-07-10 DIAGNOSIS — C3411 Malignant neoplasm of upper lobe, right bronchus or lung: Secondary | ICD-10-CM

## 2016-07-10 DIAGNOSIS — C3491 Malignant neoplasm of unspecified part of right bronchus or lung: Secondary | ICD-10-CM

## 2016-07-10 DIAGNOSIS — C3412 Malignant neoplasm of upper lobe, left bronchus or lung: Secondary | ICD-10-CM

## 2016-07-10 DIAGNOSIS — C3492 Malignant neoplasm of unspecified part of left bronchus or lung: Principal | ICD-10-CM

## 2016-07-10 DIAGNOSIS — Z5111 Encounter for antineoplastic chemotherapy: Secondary | ICD-10-CM

## 2016-07-10 LAB — COMPREHENSIVE METABOLIC PANEL
ALBUMIN: 3.1 g/dL — AB (ref 3.5–5.0)
ALK PHOS: 97 U/L (ref 40–150)
ALT: 12 U/L (ref 0–55)
ANION GAP: 9 meq/L (ref 3–11)
AST: 21 U/L (ref 5–34)
BUN: 17 mg/dL (ref 7.0–26.0)
CO2: 25 mEq/L (ref 22–29)
Calcium: 9.4 mg/dL (ref 8.4–10.4)
Chloride: 106 mEq/L (ref 98–109)
Creatinine: 0.8 mg/dL (ref 0.6–1.1)
EGFR: 65 mL/min/{1.73_m2} — AB (ref >90)
GLUCOSE: 106 mg/dL (ref 70–140)
POTASSIUM: 3.9 meq/L (ref 3.5–5.1)
SODIUM: 140 meq/L (ref 136–145)
Total Bilirubin: 0.45 mg/dL (ref 0.20–1.20)
Total Protein: 7.9 g/dL (ref 6.4–8.3)

## 2016-07-10 LAB — CBC WITH DIFFERENTIAL/PLATELET
BASO%: 0.5 % (ref 0.0–2.0)
Basophils Absolute: 0 10*3/uL (ref 0.0–0.1)
EOS ABS: 0.1 10*3/uL (ref 0.0–0.5)
EOS%: 1.6 % (ref 0.0–7.0)
HCT: 38.7 % (ref 34.8–46.6)
HEMOGLOBIN: 12.1 g/dL (ref 11.6–15.9)
LYMPH%: 31.4 % (ref 14.0–49.7)
MCH: 31.4 pg (ref 25.1–34.0)
MCHC: 31.3 g/dL — ABNORMAL LOW (ref 31.5–36.0)
MCV: 100.5 fL (ref 79.5–101.0)
MONO#: 0.4 10*3/uL (ref 0.1–0.9)
MONO%: 10 % (ref 0.0–14.0)
NEUT#: 2.5 10*3/uL (ref 1.5–6.5)
NEUT%: 56.5 % (ref 38.4–76.8)
Platelets: 329 10*3/uL (ref 145–400)
RBC: 3.85 10*6/uL (ref 3.70–5.45)
RDW: 15.2 % — ABNORMAL HIGH (ref 11.2–14.5)
WBC: 4.4 10*3/uL (ref 3.9–10.3)
lymph#: 1.4 10*3/uL (ref 0.9–3.3)

## 2016-07-10 MED ORDER — SODIUM CHLORIDE 0.9 % IV SOLN
Freq: Once | INTRAVENOUS | Status: AC
  Administered 2016-07-10: 13:00:00 via INTRAVENOUS

## 2016-07-10 MED ORDER — SODIUM CHLORIDE 0.9 % IV SOLN
Freq: Once | INTRAVENOUS | Status: AC
  Administered 2016-07-10: 13:00:00 via INTRAVENOUS
  Filled 2016-07-10: qty 8

## 2016-07-10 MED ORDER — CYANOCOBALAMIN 1000 MCG/ML IJ SOLN
INTRAMUSCULAR | Status: AC
  Filled 2016-07-10: qty 1

## 2016-07-10 MED ORDER — HEPARIN SOD (PORK) LOCK FLUSH 100 UNIT/ML IV SOLN
500.0000 [IU] | Freq: Once | INTRAVENOUS | Status: AC | PRN
  Administered 2016-07-10: 500 [IU]
  Filled 2016-07-10: qty 5

## 2016-07-10 MED ORDER — CYANOCOBALAMIN 1000 MCG/ML IJ SOLN
1000.0000 ug | Freq: Once | INTRAMUSCULAR | Status: AC
  Administered 2016-07-10: 1000 ug via INTRAMUSCULAR

## 2016-07-10 MED ORDER — SODIUM CHLORIDE 0.9 % IJ SOLN
10.0000 mL | INTRAMUSCULAR | Status: DC | PRN
  Administered 2016-07-10: 10 mL
  Filled 2016-07-10: qty 10

## 2016-07-10 MED ORDER — SODIUM CHLORIDE 0.9 % IV SOLN
400.0000 mg/m2 | Freq: Once | INTRAVENOUS | Status: AC
  Administered 2016-07-10: 675 mg via INTRAVENOUS
  Filled 2016-07-10: qty 20

## 2016-07-10 NOTE — Patient Instructions (Signed)
[facility] Discharge Instructions for Patients Receiving Chemotherapy  Today you received the following chemotherapy agents Alimta.  To help prevent nausea and vomiting after your treatment, we encourage you to take your nausea medication as prescribed.   If you develop nausea and vomiting that is not controlled by your nausea medication, call the clinic.   BELOW ARE SYMPTOMS THAT SHOULD BE REPORTED IMMEDIATELY:  *FEVER GREATER THAN 100.5 F  *CHILLS WITH OR WITHOUT FEVER  NAUSEA AND VOMITING THAT IS NOT CONTROLLED WITH YOUR NAUSEA MEDICATION  *UNUSUAL SHORTNESS OF BREATH  *UNUSUAL BRUISING OR BLEEDING  TENDERNESS IN MOUTH AND THROAT WITH OR WITHOUT PRESENCE OF ULCERS  *URINARY PROBLEMS  *BOWEL PROBLEMS  UNUSUAL RASH Items with * indicate a potential emergency and should be followed up as soon as possible.  Feel free to call the clinic you have any questions or concerns. The clinic phone number is [phone].  Please show the CHEMO ALERT CARD at check-in to the Emergency Department and triage nurse.   

## 2016-07-10 NOTE — Progress Notes (Signed)
[facility] Telephone:([phone]   Fax:(336) [place], MD [place] [address]  DIAGNOSIS: Recurrent non-small cell lung cancer, adenocarcinoma initially diagnosed as stage IB (T2, N0, M0) in June 2008 with tumor size of 8.0 cm.  PRIOR THERAPY: #1 status post left upper lobectomy with lymph node dissection under the care of Dr. Arlyce Dice on 06/29/2006.  #2 status post 4 cycles of adjuvant chemotherapy with cisplatin and Taxotere last dose given 08/01/2006.  #3 status post 6 cycles of systemic chemotherapy with carboplatin and Alimta for disease recurrence last dose given 12/12/2009.  #4 palliative radiotherapy to the enlarging right upper lobe lung mass under the care of Dr. Pablo Ledger completed on 11/14/2015.  CURRENT THERAPY: Maintenance systemic chemotherapy with single agent Alimta 400 MG/M2 every 3 weeks, status post 94 cycles.  INTERVAL HISTORY: [name] 83 y.o. female came to the clinic today for follow-up visit. The patient is feeling much better today. She missed her last cycle of the chemotherapy because of infection. She was treated with antibiotics under the care of Dr. Lucia Gaskins. She denied having any recent chest pain, shortness of breath but continues to have dry cough and she is currently on Delsym. Her cough is better controlled. She denied having any hemoptysis. She has no significant weight loss or night sweats. The patient denied having any nausea, vomiting, diarrhea or constipation. She is here today for evaluation before starting cycle #95.  MEDICAL HISTORY: Past Medical History:  Diagnosis Date  . FHx: chemotherapy 2008&2011   4 cycles cisplatin,taxotere,2008& carboplatin and Alimta 2011  . History of migraine headaches   . Hypercholesterolemia   . lung ca dx'd 06/2006   rt and lt lung  . Lung cancer ([place]) 3/14/1   right- Adenocarcinoma w/bronchioalveolar features  . Radiation  11/01/15-11/14/15   right upper lobe 30 gray  . Radiation pneumonitis ([place]) 01/24/2016    ALLERGIES:  is allergic to simvastatin and iodinated diagnostic agents.  MEDICATIONS:  Current Outpatient Prescriptions  Medication Sig Dispense Refill  . Calcium Carbonate-Vit D-Min (CALTRATE PLUS PO) Take 500 mg by mouth 2 (two) times daily. Reported on 12/13/2015    . chlorpheniramine-HYDROcodone (TUSSIONEX) 10-8 MG/5ML SUER Take 5 mLs by mouth 2 (two) times daily. 540 mL 0  . folic acid (FOLVITE) 1 MG tablet Take 1 mg by mouth daily. Reported on 12/13/2015    . Multiple Vitamins-Minerals (CENTRUM SILVER PO) Take 1 tablet by mouth daily. Reported on 12/13/2015    . prochlorperazine (COMPAZINE) 10 MG tablet Take 1 tablet (10 mg total) by mouth every 6 (six) hours as needed. (Patient not taking: Reported on 05/29/2016) 15 tablet 1  . vitamin B-12 (CYANOCOBALAMIN) 500 MCG tablet Take 1,500 mcg by mouth daily. Reported on 12/13/2015     No current facility-administered medications for this visit.     SURGICAL HISTORY:  Past Surgical History:  Procedure Laterality Date  . CATARACT EXTRACTION  2013   bilateral  . left  lowerlung lobectomy Left 06/19/2006   Dr.Burney,Left lower lobectomy  . Porta-cath  2011    REVIEW OF SYSTEMS:  A comprehensive review of systems was negative except for: Respiratory: positive for cough   PHYSICAL EXAMINATION: General appearance: alert, cooperative and no distress Head: Normocephalic, without obvious abnormality, atraumatic Neck: no adenopathy, no JVD, supple, symmetrical, trachea midline and thyroid not enlarged, symmetric, no tenderness/mass/nodules Lymph nodes: Cervical, supraclavicular, and axillary nodes normal. Resp: wheezes bilaterally Back:  symmetric, no curvature. ROM normal. No CVA tenderness. Cardio: regular rate and rhythm, S1, S2 normal, no murmur, click, rub or gallop and normal apical impulse GI: soft, non-tender; bowel sounds normal; no masses,  no  organomegaly Extremities: extremities normal, atraumatic, no cyanosis or edema  ECOG PERFORMANCE STATUS: 1 - Symptomatic but completely ambulatory  There were no vitals taken for this visit.  LABORATORY DATA: Lab Results  Component Value Date   WBC 4.4 07/10/2016   HGB 12.1 07/10/2016   HCT 38.7 07/10/2016   MCV 100.5 07/10/2016   PLT 329 07/10/2016      Chemistry      Component Value Date/Time   NA 140 07/10/2016 1030   K 3.9 07/10/2016 1030   CL 108 (H) 11/04/2012 0947   CO2 25 07/10/2016 1030   BUN 17.0 07/10/2016 1030   CREATININE 0.8 07/10/2016 1030      Component Value Date/Time   CALCIUM 9.4 07/10/2016 1030   ALKPHOS 97 07/10/2016 1030   AST 21 07/10/2016 1030   ALT 12 07/10/2016 1030   BILITOT 0.45 07/10/2016 1030       RADIOGRAPHIC STUDIES: No results found.  ASSESSMENT AND PLAN:  This is a very pleasant 83 years old white female with recurrent non-small cell lung cancer, adenocarcinoma. The patient was treated with induction chemotherapy with carboplatin and Alimta and currently on maintenance chemotherapy with Alimta status post 94 cycles. She has been tolerating the treatment well with no significant adverse effects. I recommended for her to proceed with cycle #95 today as scheduled. She will come back for follow-up visit in 3 weeks for evaluation before starting cycle #96. The patient was advised to call immediately if she has any concerning symptoms in the interval. The patient voices understanding of current disease status and treatment options and is in agreement with the current care plan.  All questions were answered. The patient knows to call the clinic with any problems, questions or concerns. We can certainly see the patient much sooner if necessary. I spent 10 minutes counseling the patient face to face. The total time spent in the appointment was 15 minutes.  Disclaimer: This note was dictated with voice recognition software. Similar sounding  words can inadvertently be transcribed and may not be corrected upon review.

## 2016-07-10 NOTE — Telephone Encounter (Signed)
Appointments scheduled per 2/7 LOS. Patient given AVS report and calendars with future scheduled appointments.

## 2016-07-31 ENCOUNTER — Telehealth: Payer: Self-pay | Admitting: Internal Medicine

## 2016-07-31 ENCOUNTER — Ambulatory Visit (HOSPITAL_BASED_OUTPATIENT_CLINIC_OR_DEPARTMENT_OTHER): Payer: Medicare Other

## 2016-07-31 ENCOUNTER — Encounter: Payer: Self-pay | Admitting: Internal Medicine

## 2016-07-31 ENCOUNTER — Other Ambulatory Visit (HOSPITAL_BASED_OUTPATIENT_CLINIC_OR_DEPARTMENT_OTHER): Payer: Medicare Other

## 2016-07-31 ENCOUNTER — Ambulatory Visit (HOSPITAL_BASED_OUTPATIENT_CLINIC_OR_DEPARTMENT_OTHER): Payer: Medicare Other | Admitting: Internal Medicine

## 2016-07-31 VITALS — BP 153/79 | HR 91 | Temp 98.9°F | Resp 17 | Ht 62.0 in | Wt 134.5 lb

## 2016-07-31 DIAGNOSIS — C3411 Malignant neoplasm of upper lobe, right bronchus or lung: Secondary | ICD-10-CM

## 2016-07-31 DIAGNOSIS — Z85118 Personal history of other malignant neoplasm of bronchus and lung: Secondary | ICD-10-CM

## 2016-07-31 DIAGNOSIS — C3492 Malignant neoplasm of unspecified part of left bronchus or lung: Principal | ICD-10-CM

## 2016-07-31 DIAGNOSIS — C3491 Malignant neoplasm of unspecified part of right bronchus or lung: Secondary | ICD-10-CM

## 2016-07-31 DIAGNOSIS — Z5112 Encounter for antineoplastic immunotherapy: Secondary | ICD-10-CM

## 2016-07-31 DIAGNOSIS — Z5111 Encounter for antineoplastic chemotherapy: Secondary | ICD-10-CM

## 2016-07-31 DIAGNOSIS — I10 Essential (primary) hypertension: Secondary | ICD-10-CM

## 2016-07-31 LAB — COMPREHENSIVE METABOLIC PANEL
ALT: 11 U/L (ref 0–55)
AST: 21 U/L (ref 5–34)
Albumin: 3.3 g/dL — ABNORMAL LOW (ref 3.5–5.0)
Alkaline Phosphatase: 98 U/L (ref 40–150)
Anion Gap: 7 mEq/L (ref 3–11)
BILIRUBIN TOTAL: 0.41 mg/dL (ref 0.20–1.20)
BUN: 18.4 mg/dL (ref 7.0–26.0)
CHLORIDE: 106 meq/L (ref 98–109)
CO2: 27 meq/L (ref 22–29)
CREATININE: 0.8 mg/dL (ref 0.6–1.1)
Calcium: 9.4 mg/dL (ref 8.4–10.4)
EGFR: 69 mL/min/{1.73_m2} — ABNORMAL LOW (ref >90)
GLUCOSE: 84 mg/dL (ref 70–140)
Potassium: 3.9 mEq/L (ref 3.5–5.1)
SODIUM: 140 meq/L (ref 136–145)
TOTAL PROTEIN: 7.7 g/dL (ref 6.4–8.3)

## 2016-07-31 LAB — CBC WITH DIFFERENTIAL/PLATELET
BASO%: 0.6 % (ref 0.0–2.0)
Basophils Absolute: 0 10*3/uL (ref 0.0–0.1)
EOS%: 0.6 % (ref 0.0–7.0)
Eosinophils Absolute: 0 10*3/uL (ref 0.0–0.5)
HCT: 37.8 % (ref 34.8–46.6)
HGB: 12.3 g/dL (ref 11.6–15.9)
LYMPH%: 24.3 % (ref 14.0–49.7)
MCH: 31.8 pg (ref 25.1–34.0)
MCHC: 32.5 g/dL (ref 31.5–36.0)
MCV: 97.9 fL (ref 79.5–101.0)
MONO#: 0.4 10*3/uL (ref 0.1–0.9)
MONO%: 11 % (ref 0.0–14.0)
NEUT%: 63.5 % (ref 38.4–76.8)
NEUTROS ABS: 2.5 10*3/uL (ref 1.5–6.5)
Platelets: 275 10*3/uL (ref 145–400)
RBC: 3.86 10*6/uL (ref 3.70–5.45)
RDW: 16.2 % — ABNORMAL HIGH (ref 11.2–14.5)
WBC: 4 10*3/uL (ref 3.9–10.3)
lymph#: 1 10*3/uL (ref 0.9–3.3)

## 2016-07-31 MED ORDER — HEPARIN SOD (PORK) LOCK FLUSH 100 UNIT/ML IV SOLN
500.0000 [IU] | Freq: Once | INTRAVENOUS | Status: AC | PRN
  Administered 2016-07-31: 500 [IU]
  Filled 2016-07-31: qty 5

## 2016-07-31 MED ORDER — SODIUM CHLORIDE 0.9 % IJ SOLN
10.0000 mL | INTRAMUSCULAR | Status: DC | PRN
  Administered 2016-07-31: 10 mL
  Filled 2016-07-31: qty 10

## 2016-07-31 MED ORDER — SODIUM CHLORIDE 0.9 % IV SOLN
Freq: Once | INTRAVENOUS | Status: AC
  Administered 2016-07-31: 14:00:00 via INTRAVENOUS
  Filled 2016-07-31: qty 8

## 2016-07-31 MED ORDER — SODIUM CHLORIDE 0.9 % IV SOLN
Freq: Once | INTRAVENOUS | Status: AC
  Administered 2016-07-31: 14:00:00 via INTRAVENOUS

## 2016-07-31 MED ORDER — SODIUM CHLORIDE 0.9 % IV SOLN
400.0000 mg/m2 | Freq: Once | INTRAVENOUS | Status: AC
  Administered 2016-07-31: 675 mg via INTRAVENOUS
  Filled 2016-07-31: qty 20

## 2016-07-31 NOTE — Progress Notes (Signed)
[facility] [phone]   Fax:(336) [place], MD [place] [address]  DIAGNOSIS: Recurrent non-small cell lung cancer, adenocarcinoma initially diagnosed as stage IB (T2, N0, M0) in June 2008 with tumor size of 8.0 cm.  PRIOR THERAPY: #1 status post left upper lobectomy with lymph node dissection under the care of Dr. Arlyce Dice on 06/29/2006.  #2 status post 4 cycles of adjuvant chemotherapy with cisplatin and Taxotere last dose given 08/01/2006.  #3 status post 6 cycles of systemic chemotherapy with carboplatin and Alimta for disease recurrence last dose given 12/12/2009.  #4 palliative radiotherapy to the enlarging right upper lobe lung mass under the care of Dr. Pablo Ledger completed on 11/14/2015.  CURRENT THERAPY: Maintenance systemic chemotherapy with single agent Alimta 400 MG/M2 every 3 weeks, status post 95 cycles.  INTERVAL HISTORY: [name] 83 y.o. female came to the clinic today for follow-up visit. The patient is feeling fine with no specific complaints. She is currently on maintenance treatment with Alimta and tolerating her treatment well. She denied having any chest pain or shortness of breath but continues to have dry cough which is much better with Delsym, no hemoptysis. She denied having any weight loss or night sweats. She has no fever or chills. She denied having any nausea, vomiting, diarrhea or constipation. She is here today for evaluation before starting cycle #96 of her treatment.  MEDICAL HISTORY: Past Medical History:  Diagnosis Date  . FHx: chemotherapy 2008&2011   4 cycles cisplatin,taxotere,2008& carboplatin and Alimta 2011  . History of migraine headaches   . Hypercholesterolemia   . lung ca dx'd 06/2006   rt and lt lung  . Lung cancer ([place]) 3/14/1   right- Adenocarcinoma w/bronchioalveolar features  . Radiation 11/01/15-11/14/15   right upper lobe 30 gray  .  Radiation pneumonitis ([place]) 01/24/2016    ALLERGIES:  is allergic to simvastatin and iodinated diagnostic agents.  MEDICATIONS:  Current Outpatient Prescriptions  Medication Sig Dispense Refill  . Calcium Carbonate-Vit D-Min (CALTRATE PLUS PO) Take 500 mg by mouth 2 (two) times daily. Reported on 12/13/2015    . chlorpheniramine-HYDROcodone (TUSSIONEX) 10-8 MG/5ML SUER Take 5 mLs by mouth 2 (two) times daily. 242 mL 0  . folic acid (FOLVITE) 1 MG tablet Take 1 mg by mouth daily. Reported on 12/13/2015    . Multiple Vitamins-Minerals (CENTRUM SILVER PO) Take 1 tablet by mouth daily. Reported on 12/13/2015    . Phenylephrine-DM-GG-APAP (DELSYM COUGH/COLD DAYTIME PO) Take by mouth.    . prochlorperazine (COMPAZINE) 10 MG tablet Take 1 tablet (10 mg total) by mouth every 6 (six) hours as needed. 15 tablet 1  . vitamin B-12 (CYANOCOBALAMIN) 500 MCG tablet Take 1,500 mcg by mouth daily. Reported on 12/13/2015     No current facility-administered medications for this visit.     SURGICAL HISTORY:  Past Surgical History:  Procedure Laterality Date  . CATARACT EXTRACTION  2013   bilateral  . left  lowerlung lobectomy Left 06/19/2006   Dr.Burney,Left lower lobectomy  . Porta-cath  2011    REVIEW OF SYSTEMS:  A comprehensive review of systems was negative except for: Respiratory: positive for cough   PHYSICAL EXAMINATION: General appearance: alert, cooperative and no distress Head: Normocephalic, without obvious abnormality, atraumatic Neck: no adenopathy, no JVD, supple, symmetrical, trachea midline and thyroid not enlarged, symmetric, no tenderness/mass/nodules Lymph nodes: Cervical, supraclavicular, and axillary nodes normal. Resp: wheezes bilaterally Back: symmetric,  no curvature. ROM normal. No CVA tenderness. Cardio: regular rate and rhythm, S1, S2 normal, no murmur, click, rub or gallop and normal apical impulse GI: soft, non-tender; bowel sounds normal; no masses,  no  organomegaly Extremities: extremities normal, atraumatic, no cyanosis or edema  ECOG PERFORMANCE STATUS: 1 - Symptomatic but completely ambulatory  Blood pressure (!) 153/79, pulse 91, temperature 98.9 F (37.2 C), temperature source Oral, resp. rate 17, height '5\' 2"'$  (1.575 m), weight 134 lb 8 oz (61 kg), SpO2 99 %.  LABORATORY DATA: Lab Results  Component Value Date   WBC 4.0 07/31/2016   HGB 12.3 07/31/2016   HCT 37.8 07/31/2016   MCV 97.9 07/31/2016   PLT 275 07/31/2016      Chemistry      Component Value Date/Time   NA 140 07/10/2016 1030   K 3.9 07/10/2016 1030   CL 108 (H) 11/04/2012 0947   CO2 25 07/10/2016 1030   BUN 17.0 07/10/2016 1030   CREATININE 0.8 07/10/2016 1030      Component Value Date/Time   CALCIUM 9.4 07/10/2016 1030   ALKPHOS 97 07/10/2016 1030   AST 21 07/10/2016 1030   ALT 12 07/10/2016 1030   BILITOT 0.45 07/10/2016 1030       RADIOGRAPHIC STUDIES: No results found.  ASSESSMENT AND PLAN:  This is a very pleasant 83 years old white female with recurrent non-small cell lung cancer, adenocarcinoma status post induction systemic chemotherapy with carboplatin and Alimta. She is currently undergoing maintenance treatment with single agent Alimta status post 95 cycles. The patient is tolerating her treatment well with no significant adverse effects. I recommended for her to proceed with cycle #96 today as scheduled. I will see her back for follow-up visit in 3 weeks for evaluation after repeating CT scan of the chest for restaging of her disease. She was advised to call immediately if she has any concerning symptoms in the interval. For the hypertension, this is just a one-time reading. I recommended for the patient to monitor her blood pressure closely at home and to report to her primary care physician and continues to be elevated. The patient voices understanding of current disease status and treatment options and is in agreement with the current  care plan. All questions were answered. The patient knows to call the clinic with any problems, questions or concerns. We can certainly see the patient much sooner if necessary. I spent 10 minutes counseling the patient face to face. The total time spent in the appointment was 15 minutes. Disclaimer: This note was dictated with voice recognition software. Similar sounding words can inadvertently be transcribed and may not be corrected upon review.

## 2016-07-31 NOTE — Patient Instructions (Signed)
[facility] Discharge Instructions for Patients Receiving Chemotherapy  Today you received the following chemotherapy agents Alimta.  To help prevent nausea and vomiting after your treatment, we encourage you to take your nausea medication as prescribed.   If you develop nausea and vomiting that is not controlled by your nausea medication, call the clinic.   BELOW ARE SYMPTOMS THAT SHOULD BE REPORTED IMMEDIATELY:  *FEVER GREATER THAN 100.5 F  *CHILLS WITH OR WITHOUT FEVER  NAUSEA AND VOMITING THAT IS NOT CONTROLLED WITH YOUR NAUSEA MEDICATION  *UNUSUAL SHORTNESS OF BREATH  *UNUSUAL BRUISING OR BLEEDING  TENDERNESS IN MOUTH AND THROAT WITH OR WITHOUT PRESENCE OF ULCERS  *URINARY PROBLEMS  *BOWEL PROBLEMS  UNUSUAL RASH Items with * indicate a potential emergency and should be followed up as soon as possible.  Feel free to call the clinic you have any questions or concerns. The clinic phone number is [phone].  Please show the CHEMO ALERT CARD at check-in to the Emergency Department and triage nurse.   

## 2016-07-31 NOTE — Telephone Encounter (Signed)
Gave patient AVS and calender for March thru April per 07/31/2016 los

## 2016-08-02 ENCOUNTER — Other Ambulatory Visit: Payer: Medicare Other

## 2016-08-05 ENCOUNTER — Other Ambulatory Visit: Payer: Self-pay | Admitting: Medical Oncology

## 2016-08-05 ENCOUNTER — Telehealth: Payer: Self-pay | Admitting: Medical Oncology

## 2016-08-05 ENCOUNTER — Encounter: Payer: Medicare Other | Admitting: Oncology

## 2016-08-05 DIAGNOSIS — C3491 Malignant neoplasm of unspecified part of right bronchus or lung: Secondary | ICD-10-CM

## 2016-08-05 DIAGNOSIS — R05 Cough: Secondary | ICD-10-CM

## 2016-08-05 DIAGNOSIS — J700 Acute pulmonary manifestations due to radiation: Secondary | ICD-10-CM

## 2016-08-05 DIAGNOSIS — R059 Cough, unspecified: Secondary | ICD-10-CM

## 2016-08-05 DIAGNOSIS — Z5111 Encounter for antineoplastic chemotherapy: Secondary | ICD-10-CM

## 2016-08-05 DIAGNOSIS — C3492 Malignant neoplasm of unspecified part of left bronchus or lung: Secondary | ICD-10-CM

## 2016-08-05 MED ORDER — HYDROCOD POLST-CPM POLST ER 10-8 MG/5ML PO SUER: 5.0000 mL | Freq: Two times a day (BID) | ORAL | 0 refills | Status: DC

## 2016-08-05 MED ORDER — METHYLPREDNISOLONE 4 MG PO TBPK: ORAL_TABLET | ORAL | 0 refills | Status: DC

## 2016-08-05 NOTE — Telephone Encounter (Addendum)
Persistent cough -Went to urgent care sat for cough  -was told pneumonia-per pt no cxr done. Temp 100.1 over the weekend started on levaquin. I could hear pt in background coughing non stop. Per Mohamed Medrol dose pack and if not better in 1-2 days call back . Move up scans.

## 2016-08-15 ENCOUNTER — Ambulatory Visit (HOSPITAL_BASED_OUTPATIENT_CLINIC_OR_DEPARTMENT_OTHER): Payer: Medicare Other

## 2016-08-15 ENCOUNTER — Ambulatory Visit (HOSPITAL_COMMUNITY)
Admission: RE | Admit: 2016-08-15 | Discharge: 2016-08-15 | Disposition: A | Discharge status: Home / Self Care | Payer: Medicare Other | Source: Ambulatory Visit | Attending: Nurse Practitioner | Admitting: Nurse Practitioner

## 2016-08-15 ENCOUNTER — Telehealth: Payer: Self-pay | Admitting: Medical Oncology

## 2016-08-15 ENCOUNTER — Ambulatory Visit (HOSPITAL_BASED_OUTPATIENT_CLINIC_OR_DEPARTMENT_OTHER): Payer: Medicare Other | Admitting: Nurse Practitioner

## 2016-08-15 ENCOUNTER — Other Ambulatory Visit: Payer: Self-pay | Admitting: Nurse Practitioner

## 2016-08-15 ENCOUNTER — Other Ambulatory Visit: Payer: Self-pay | Admitting: Medical Oncology

## 2016-08-15 VITALS — BP 149/86 | HR 97 | Temp 97.6°F | Resp 19 | Ht 62.0 in | Wt 130.6 lb

## 2016-08-15 DIAGNOSIS — J40 Bronchitis, not specified as acute or chronic: Secondary | ICD-10-CM

## 2016-08-15 DIAGNOSIS — J90 Pleural effusion, not elsewhere classified: Secondary | ICD-10-CM | POA: Diagnosis not present

## 2016-08-15 DIAGNOSIS — C3492 Malignant neoplasm of unspecified part of left bronchus or lung: Principal | ICD-10-CM

## 2016-08-15 DIAGNOSIS — Z9889 Other specified postprocedural states: Secondary | ICD-10-CM | POA: Diagnosis not present

## 2016-08-15 DIAGNOSIS — I2510 Atherosclerotic heart disease of native coronary artery without angina pectoris: Secondary | ICD-10-CM | POA: Diagnosis not present

## 2016-08-15 DIAGNOSIS — R05 Cough: Secondary | ICD-10-CM

## 2016-08-15 DIAGNOSIS — R059 Cough, unspecified: Secondary | ICD-10-CM

## 2016-08-15 DIAGNOSIS — I700 Atherosclerosis of aorta: Secondary | ICD-10-CM | POA: Diagnosis not present

## 2016-08-15 DIAGNOSIS — C3491 Malignant neoplasm of unspecified part of right bronchus or lung: Secondary | ICD-10-CM | POA: Insufficient documentation

## 2016-08-15 DIAGNOSIS — J700 Acute pulmonary manifestations due to radiation: Secondary | ICD-10-CM | POA: Diagnosis present

## 2016-08-15 DIAGNOSIS — Z85118 Personal history of other malignant neoplasm of bronchus and lung: Secondary | ICD-10-CM | POA: Diagnosis not present

## 2016-08-15 LAB — CBC WITH DIFFERENTIAL/PLATELET
BASO%: 0.2 % (ref 0.0–2.0)
Basophils Absolute: 0 10*3/uL (ref 0.0–0.1)
EOS ABS: 0 10*3/uL (ref 0.0–0.5)
EOS%: 0.7 % (ref 0.0–7.0)
HCT: 38.5 % (ref 34.8–46.6)
HEMOGLOBIN: 12.5 g/dL (ref 11.6–15.9)
LYMPH%: 28.1 % (ref 14.0–49.7)
MCH: 31.2 pg (ref 25.1–34.0)
MCHC: 32.5 g/dL (ref 31.5–36.0)
MCV: 96 fL (ref 79.5–101.0)
MONO#: 0.6 10*3/uL (ref 0.1–0.9)
MONO%: 13.8 % (ref 0.0–14.0)
NEUT%: 57.2 % (ref 38.4–76.8)
NEUTROS ABS: 2.6 10*3/uL (ref 1.5–6.5)
Platelets: 276 10*3/uL (ref 145–400)
RBC: 4.01 10*6/uL (ref 3.70–5.45)
RDW: 16.4 % — AB (ref 11.2–14.5)
WBC: 4.5 10*3/uL (ref 3.9–10.3)
lymph#: 1.3 10*3/uL (ref 0.9–3.3)

## 2016-08-15 LAB — COMPREHENSIVE METABOLIC PANEL
ALBUMIN: 2.9 g/dL — AB (ref 3.5–5.0)
ALK PHOS: 90 U/L (ref 40–150)
ALT: 13 U/L (ref 0–55)
AST: 22 U/L (ref 5–34)
Anion Gap: 7 mEq/L (ref 3–11)
BILIRUBIN TOTAL: 0.64 mg/dL (ref 0.20–1.20)
BUN: 15.3 mg/dL (ref 7.0–26.0)
CO2: 25 meq/L (ref 22–29)
Calcium: 9 mg/dL (ref 8.4–10.4)
Chloride: 107 mEq/L (ref 98–109)
Creatinine: 0.8 mg/dL (ref 0.6–1.1)
EGFR: 66 mL/min/{1.73_m2} — ABNORMAL LOW (ref >90)
GLUCOSE: 98 mg/dL (ref 70–140)
Potassium: 4.1 mEq/L (ref 3.5–5.1)
Sodium: 139 mEq/L (ref 136–145)
TOTAL PROTEIN: 7.2 g/dL (ref 6.4–8.3)

## 2016-08-15 MED ORDER — ALBUTEROL SULFATE (2.5 MG/3ML) 0.083% IN NEBU
2.5000 mg | INHALATION_SOLUTION | Freq: Once | RESPIRATORY_TRACT | Status: AC
  Administered 2016-08-15: 2.5 mg via RESPIRATORY_TRACT
  Filled 2016-08-15: qty 3

## 2016-08-15 MED ORDER — IOPAMIDOL (ISOVUE-370) INJECTION 76%
INTRAVENOUS | Status: AC
  Administered 2016-08-15: 100 mL
  Filled 2016-08-15: qty 100

## 2016-08-15 MED ORDER — HYDROCOD POLST-CPM POLST ER 10-8 MG/5ML PO SUER
5.0000 mL | Freq: Two times a day (BID) | ORAL | 0 refills | Status: DC
Start: 2016-08-15 — End: 2016-09-18

## 2016-08-15 NOTE — Telephone Encounter (Signed)
Per Cyndee I left message for daughter and pt to cancel cxr and come directly to cancer center.

## 2016-08-15 NOTE — Progress Notes (Signed)
Albuterol treatment given for wheezing. Tolerated well. Pt states she is breathing a bit better. Transported to radiology per w/c per Edwin Cap, NT for chest CT/angio. Pt to go home after and will be called with results.

## 2016-08-15 NOTE — Telephone Encounter (Signed)
Daughter called to report [name] is not doing any better after being treated for pneumonia. with antibiotics ( urgent care) and medrol dose pac from  [facility] . When she coughs she cannot catch her breath. Per Julien Nordmann pt scheduled today for cxr, labs and [facility]. Dtr instructed to bring pt now.

## 2016-08-16 ENCOUNTER — Other Ambulatory Visit: Payer: Self-pay | Admitting: Nurse Practitioner

## 2016-08-16 ENCOUNTER — Encounter: Payer: Self-pay | Admitting: Nurse Practitioner

## 2016-08-16 ENCOUNTER — Telehealth: Payer: Self-pay | Admitting: Nurse Practitioner

## 2016-08-16 DIAGNOSIS — C3492 Malignant neoplasm of unspecified part of left bronchus or lung: Principal | ICD-10-CM

## 2016-08-16 DIAGNOSIS — C3491 Malignant neoplasm of unspecified part of right bronchus or lung: Secondary | ICD-10-CM

## 2016-08-16 DIAGNOSIS — J40 Bronchitis, not specified as acute or chronic: Secondary | ICD-10-CM

## 2016-08-16 MED ORDER — LEVOFLOXACIN 500 MG PO TABS: 500.0000 mg | ORAL_TABLET | Freq: Every day | ORAL | 0 refills | Status: DC

## 2016-08-16 MED ORDER — ALBUTEROL SULFATE HFA 108 (90 BASE) MCG/ACT IN AERS: 1.0000 | INHALATION_SPRAY | Freq: Four times a day (QID) | RESPIRATORY_TRACT | 2 refills | Status: DC | PRN

## 2016-08-16 NOTE — Assessment & Plan Note (Signed)
Patient received her last cycof Alimta chemotherapy on 07/31/2016.  She is status post radiation treatments that were completed in June 2017.  Patient is scheduled to return on 08/21/2016 for labs, visit, and her next cycle of chemotherapy.

## 2016-08-16 NOTE — Assessment & Plan Note (Signed)
Cancer.  Patient will patient states that she has had URI symptoms with a very congested cough and wheezing for the pastweek or so.  She states that she hears wheezing; specifically at night.  She feels slightly short of breath as well; and has increased difficulty taking a deep breath.She denies any recent fevers or chills.    Exam today reveals rhonchi to all lung fields; and a great deal of wheezing to all lung fields as well.  Patient does not appear in any acute respiratory distress.  O2 sat was 100% on room air and patient's tumor to was 97.6.   Patient was given an albuterol nebulizer treatment while at the  [facility]; which seem to help with her coughing and wheezing.  Reviewed all findings with Dr. Julien Nordmann; he recommended the patient undergo a CT angiogram of the chest for further evaluation.  CT angiogram of the chest revealed:   IMPRESSION: 1. No evidence of pulmonary embolism. 2. Trace right pleural effusion. 3. Enlarging nodule in the anterior aspect of the lingula currently measuring 2.0 x 2.9 cm, concerning for an additional focus of malignancy. 4. Chronic postradiation changes in the right lung redemonstrated. 5. Aortic atherosclerosis, in addition to left main and left anterior descending coronary artery disease.    Reviewed all findings of the CT results with Dr. Julien Nordmann.  He recommended that patient be treated for bronchitis with Levaquin antibiotics and an albuterol inhaler.  Patient was advised to call/return or go directly to the emergency department for any worsening symptoms whatsoever.

## 2016-08-16 NOTE — Progress Notes (Signed)
SYMPTOM MANAGEMENT CLINIC    Chief Complaint:  Bronchitis  HPI:  [name] 83 y.o. female diagnosed with .  Bilateral lung cancer.  Currently undergoing Alimta chemotherapy regimen.  Patient is status post radiation treatments.    No history exists.    Review of Systems  Constitutional: Positive for malaise/fatigue.  HENT: Positive for congestion.   Respiratory: Positive for cough, sputum production, shortness of breath and wheezing.   All other systems reviewed and are negative.   Past Medical History:  Diagnosis Date  . FHx: chemotherapy 2008&2011   4 cycles cisplatin,taxotere,2008& carboplatin and Alimta 2011  . History of migraine headaches   . Hypercholesterolemia   . lung ca dx'd 06/2006   rt and lt lung  . Lung cancer ([place]) 3/14/1   right- Adenocarcinoma w/bronchioalveolar features  . Radiation 11/01/15-11/14/15   right upper lobe 30 gray  . Radiation pneumonitis ([place]) 01/24/2016    Past Surgical History:  Procedure Laterality Date  . CATARACT EXTRACTION  2013   bilateral  . left  lowerlung lobectomy Left 06/19/2006   Dr.Burney,Left lower lobectomy  . Porta-cath  2011    has Bilateral lung cancer ([place]); Encounter for antineoplastic chemotherapy; Radiation pneumonitis ([place]); and Bronchitis on her problem list.    is allergic to simvastatin.  Allergies as of 08/15/2016      Reactions   Simvastatin Other (See Comments)   Cluster migraines      Medication List       Accurate as of 08/15/16 11:59 PM. Always use your most recent med list.          CALTRATE PLUS PO Take 500 mg by mouth 2 (two) times daily. Reported on 12/13/2015   CENTRUM SILVER PO Take 1 tablet by mouth daily. Reported on 12/13/2015   chlorpheniramine-HYDROcodone 10-8 MG/5ML Suer Commonly known as:  TUSSIONEX Take 5 mLs by mouth 2 (two) times daily.   DELSYM COUGH/COLD DAYTIME PO Take by mouth.   folic acid 1 MG tablet Commonly known as:  FOLVITE Take 1 mg by mouth daily.  Reported on 12/13/2015   methylPREDNISolone 4 MG Tbpk tablet Commonly known as:  MEDROL DOSEPAK Take per package instructions.   prochlorperazine 10 MG tablet Commonly known as:  COMPAZINE Take 1 tablet (10 mg total) by mouth every 6 (six) hours as needed.   vitamin B-12 500 MCG tablet Commonly known as:  CYANOCOBALAMIN Take 1,500 mcg by mouth daily. Reported on 12/13/2015        PHYSICAL EXAMINATION  Oncology Vitals 08/15/2016 07/31/2016  Height 158 cm 158 cm  Weight 59.24 kg 61.009 kg  Weight (lbs) 130 lbs 10 oz 134 lbs 8 oz  BMI (kg/m2) 23.89 kg/m2 24.6 kg/m2  Temp 97.6 98.9  Pulse 97 91  Resp 19 17  Resp (Historical as of 01/02/12) - -  SpO2 97 99  BSA (m2) 1.61 m2 1.63 m2   BP Readings from Last 2 Encounters:  08/15/16 (!) 149/86  07/31/16 (!) 153/79    Physical Exam  Constitutional: She is oriented to person, place, and time. Vital signs are normal. She appears malnourished. She appears unhealthy. She appears cachectic.  HENT:  Head: Normocephalic and atraumatic.  Mouth/Throat: Oropharynx is clear and moist.  Moderate nasal congestion; no facial tenderness.  Eyes: Conjunctivae and EOM are normal. Pupils are equal, round, and reactive to light. Right eye exhibits no discharge. Left eye exhibits no discharge. No scleral icterus.  Neck: Normal range of motion. Neck supple. No JVD  present. No tracheal deviation present. No thyromegaly present.  Cardiovascular: Normal rate, regular rhythm, normal heart sounds and intact distal pulses.   Pulmonary/Chest: Effort normal. No respiratory distress. She has wheezes. She has rales. She exhibits no tenderness.  Abdominal: Soft. Bowel sounds are normal. She exhibits no distension and no mass. There is no tenderness. There is no rebound and no guarding.  Musculoskeletal: Normal range of motion. She exhibits no edema or tenderness.  Lymphadenopathy:    She has no cervical adenopathy.  Neurological: She is alert and oriented to  person, place, and time. Gait normal.  Skin: Skin is warm and dry. No rash noted. No erythema. No pallor.  Psychiatric: Affect normal.  Nursing note and vitals reviewed.   LABORATORY DATA:. Appointment on 08/15/2016  Component Date Value Ref Range Status  . WBC 08/15/2016 4.5  3.9 - 10.3 10e3/uL Final  . NEUT# 08/15/2016 2.6  1.5 - 6.5 10e3/uL Final  . HGB 08/15/2016 12.5  11.6 - 15.9 g/dL Final  . HCT 08/15/2016 38.5  34.8 - 46.6 % Final  . Platelets 08/15/2016 276  145 - 400 10e3/uL Final  . MCV 08/15/2016 96.0  79.5 - 101.0 fL Final  . MCH 08/15/2016 31.2  25.1 - 34.0 pg Final  . MCHC 08/15/2016 32.5  31.5 - 36.0 g/dL Final  . RBC 08/15/2016 4.01  3.70 - 5.45 10e6/uL Final  . RDW 08/15/2016 16.4* 11.2 - 14.5 % Final  . lymph# 08/15/2016 1.3  0.9 - 3.3 10e3/uL Final  . MONO# 08/15/2016 0.6  0.1 - 0.9 10e3/uL Final  . Eosinophils Absolute 08/15/2016 0.0  0.0 - 0.5 10e3/uL Final  . Basophils Absolute 08/15/2016 0.0  0.0 - 0.1 10e3/uL Final  . NEUT% 08/15/2016 57.2  38.4 - 76.8 % Final  . LYMPH% 08/15/2016 28.1  14.0 - 49.7 % Final  . MONO% 08/15/2016 13.8  0.0 - 14.0 % Final  . EOS% 08/15/2016 0.7  0.0 - 7.0 % Final  . BASO% 08/15/2016 0.2  0.0 - 2.0 % Final  . Sodium 08/15/2016 139  136 - 145 mEq/L Final  . Potassium 08/15/2016 4.1  3.5 - 5.1 mEq/L Final  . Chloride 08/15/2016 107  98 - 109 mEq/L Final  . CO2 08/15/2016 25  22 - 29 mEq/L Final  . Glucose 08/15/2016 98  70 - 140 mg/dl Final  . BUN 08/15/2016 15.3  7.0 - 26.0 mg/dL Final  . Creatinine 08/15/2016 0.8  0.6 - 1.1 mg/dL Final  . Total Bilirubin 08/15/2016 0.64  0.20 - 1.20 mg/dL Final  . Alkaline Phosphatase 08/15/2016 90  40 - 150 U/L Final  . AST 08/15/2016 22  5 - 34 U/L Final  . ALT 08/15/2016 13  0 - 55 U/L Final  . Total Protein 08/15/2016 7.2  6.4 - 8.3 g/dL Final  . Albumin 08/15/2016 2.9* 3.5 - 5.0 g/dL Final  . Calcium 08/15/2016 9.0  8.4 - 10.4 mg/dL Final  . Anion Gap 08/15/2016 7  3 - 11 mEq/L Final    . EGFR 08/15/2016 66* >90 ml/min/1.73 m2 Final    RADIOGRAPHIC STUDIES: Ct Angio Chest Pe W Or Wo Contrast  Result Date: 08/15/2016 CLINICAL DATA:  83 year old female with increasing shortness of breath and coughing. History of lung cancer diagnosed in 2017 treated with radiation therapy to the right lung. Prior history of left lower lobectomy in 2008. EXAM: CT ANGIOGRAPHY CHEST WITH CONTRAST TECHNIQUE: Multidetector CT imaging of the chest was performed using the standard protocol during  bolus administration of intravenous contrast. Multiplanar CT image reconstructions and MIPs were obtained to evaluate the vascular anatomy. CONTRAST:  76 mL of Isovue-370 COMPARISON:  None. FINDINGS: Cardiovascular: There are no filling defects within the pulmonary arterial tree to suggest underlying pulmonary embolism. Heart size is normal. There is no significant pericardial fluid, thickening or pericardial calcification. There is aortic atherosclerosis, as well as atherosclerosis of the great vessels of the mediastinum and the coronary arteries, including calcified atherosclerotic plaque in the left main and left anterior descending coronary arteries. Right internal jugular single-lumen porta cath with tip terminating in the distal superior vena cava. Mediastinum/Nodes: No pathologically enlarged mediastinal or hilar lymph nodes. Esophagus is unremarkable in appearance. No axillary lymphadenopathy. Lungs/Pleura: There continues to be extensive mass-like architectural distortion in the right upper lung, predominantly involving the right upper lobe and adjacent aspect of the superior segment of the right lower lobe, most compatible with chronic postradiation mass-like fibrosis. In addition, there is a nodular area in the inferior aspect of the lingula, which appears larger than prior studies, currently measuring 2.0 x 2.9 cm (axial image 84 of series 7) previously 17 mm in diameter on 01/23/2016), associated with increasing  pleural retraction, concerning for an additional focus of malignancy. Trace right pleural effusion lying dependently. No acute consolidative airspace disease. Upper Abdomen: Aortic atherosclerosis. Musculoskeletal: There are no aggressive appearing lytic or blastic lesions noted in the visualized portions of the skeleton. Review of the MIP images confirms the above findings. IMPRESSION: 1. No evidence of pulmonary embolism. 2. Trace right pleural effusion. 3. Enlarging nodule in the anterior aspect of the lingula currently measuring 2.0 x 2.9 cm, concerning for an additional focus of malignancy. 4. Chronic postradiation changes in the right lung redemonstrated. 5. Aortic atherosclerosis, in addition to left main and left anterior descending coronary artery disease. Electronically Signed   By: Vinnie Langton M.D.   On: 08/15/2016 17:21    ASSESSMENT/PLAN:    Bronchitis Cancer.  Patient will patient states that she has had URI symptoms with a very congested cough and wheezing for the pastweek or so.  She states that she hears wheezing; specifically at night.  She feels slightly short of breath as well; and has increased difficulty taking a deep breath.She denies any recent fevers or chills.    Exam today reveals rhonchi to all lung fields; and a great deal of wheezing to all lung fields as well.  Patient does not appear in any acute respiratory distress.  O2 sat was 100% on room air and patient's tumor to was 97.6.   Patient was given an albuterol nebulizer treatment while at the  [facility]; which seem to help with her coughing and wheezing.  Reviewed all findings with Dr. Julien Nordmann; he recommended the patient undergo a CT angiogram of the chest for further evaluation.  CT angiogram of the chest revealed:   IMPRESSION: 1. No evidence of pulmonary embolism. 2. Trace right pleural effusion. 3. Enlarging nodule in the anterior aspect of the lingula currently measuring 2.0 x 2.9 cm, concerning  for an additional focus of malignancy. 4. Chronic postradiation changes in the right lung redemonstrated. 5. Aortic atherosclerosis, in addition to left main and left anterior descending coronary artery disease.    Reviewed all findings of the CT results with Dr. Julien Nordmann.  He recommended that patient be treated for bronchitis with Levaquin antibiotics and an albuterol inhaler.  Patient was advised to call/return or go directly to the emergency department for any worsening  symptoms whatsoever.  Bilateral lung cancer [facility])   Patient received her last cycof Alimta chemotherapy on 07/31/2016.  She is status post radiation treatments that were completed in June 2017.  Patient is scheduled to return on 08/21/2016 for labs, visit, and her next cycle of chemotherapy.   Patient stated understanding of all instructions; and was in agreement with this plan of care. The patient knows to call the clinic with any problems, questions or concerns.   Total time spent with patient was 40 minutes;  with greater than 75 percent of that time spent in face to face counseling regarding patient's symptoms,  and coordination of care and follow up.  Disclaimer:This dictation was prepared with Dragon/digital dictation along with [employer]. Any transcriptional errors that result from this process are unintentional.  Drue Second, NP 08/16/2016

## 2016-08-16 NOTE — Telephone Encounter (Signed)
Late entry: Called patient last night and once again this morning to review her CT angiogram chest results.  Also reviewed CT results with Dr. Julien Nordmann as well.  Will prescribe patient Levaquin antibiotics in an albuterol inhaler for treatment of bronchitis symptoms.  Patient was sized to call/return or go directly to the emergency department for any worsening symptoms whatsoever.

## 2016-08-19 ENCOUNTER — Telehealth: Payer: Self-pay | Admitting: Nurse Practitioner

## 2016-08-19 NOTE — Telephone Encounter (Signed)
Patient states that she has been taking the Levaquin antibiotics and the albuterol inhaler as directed.  She states she is feeling much better today and her cough is greatly improved.  She denies any recent fever or chills.  Patient is scheduled to return on Friday, 08/22/2015 for labs, visit, and her next cycle of chemotherapy.

## 2016-08-21 ENCOUNTER — Telehealth: Payer: Self-pay | Admitting: Internal Medicine

## 2016-08-21 ENCOUNTER — Other Ambulatory Visit (HOSPITAL_BASED_OUTPATIENT_CLINIC_OR_DEPARTMENT_OTHER): Payer: Medicare Other

## 2016-08-21 ENCOUNTER — Encounter: Payer: Self-pay | Admitting: Internal Medicine

## 2016-08-21 ENCOUNTER — Ambulatory Visit: Payer: Medicare Other

## 2016-08-21 ENCOUNTER — Ambulatory Visit (HOSPITAL_BASED_OUTPATIENT_CLINIC_OR_DEPARTMENT_OTHER): Payer: Medicare Other | Admitting: Internal Medicine

## 2016-08-21 VITALS — BP 119/77 | HR 105 | Temp 97.8°F | Resp 19 | Ht 62.0 in | Wt 129.0 lb

## 2016-08-21 DIAGNOSIS — R05 Cough: Secondary | ICD-10-CM | POA: Diagnosis not present

## 2016-08-21 DIAGNOSIS — Z5111 Encounter for antineoplastic chemotherapy: Secondary | ICD-10-CM

## 2016-08-21 DIAGNOSIS — C3491 Malignant neoplasm of unspecified part of right bronchus or lung: Secondary | ICD-10-CM

## 2016-08-21 DIAGNOSIS — C3411 Malignant neoplasm of upper lobe, right bronchus or lung: Secondary | ICD-10-CM | POA: Diagnosis not present

## 2016-08-21 DIAGNOSIS — R0602 Shortness of breath: Secondary | ICD-10-CM | POA: Diagnosis not present

## 2016-08-21 DIAGNOSIS — C3492 Malignant neoplasm of unspecified part of left bronchus or lung: Principal | ICD-10-CM

## 2016-08-21 DIAGNOSIS — J700 Acute pulmonary manifestations due to radiation: Secondary | ICD-10-CM

## 2016-08-21 LAB — COMPREHENSIVE METABOLIC PANEL
ALT: 11 U/L (ref 0–55)
ANION GAP: 8 meq/L (ref 3–11)
AST: 22 U/L (ref 5–34)
Albumin: 2.9 g/dL — ABNORMAL LOW (ref 3.5–5.0)
Alkaline Phosphatase: 94 U/L (ref 40–150)
BUN: 15.3 mg/dL (ref 7.0–26.0)
CHLORIDE: 108 meq/L (ref 98–109)
CO2: 24 meq/L (ref 22–29)
Calcium: 9.3 mg/dL (ref 8.4–10.4)
Creatinine: 0.9 mg/dL (ref 0.6–1.1)
EGFR: 61 mL/min/{1.73_m2} — AB (ref >90)
GLUCOSE: 100 mg/dL (ref 70–140)
POTASSIUM: 4.2 meq/L (ref 3.5–5.1)
SODIUM: 140 meq/L (ref 136–145)
TOTAL PROTEIN: 7.3 g/dL (ref 6.4–8.3)
Total Bilirubin: 0.36 mg/dL (ref 0.20–1.20)

## 2016-08-21 LAB — CBC WITH DIFFERENTIAL/PLATELET
BASO%: 0.8 % (ref 0.0–2.0)
Basophils Absolute: 0 10*3/uL (ref 0.0–0.1)
EOS ABS: 0 10*3/uL (ref 0.0–0.5)
EOS%: 1.6 % (ref 0.0–7.0)
HEMATOCRIT: 38.1 % (ref 34.8–46.6)
HEMOGLOBIN: 12.5 g/dL (ref 11.6–15.9)
LYMPH#: 0.8 10*3/uL — AB (ref 0.9–3.3)
LYMPH%: 29.3 % (ref 14.0–49.7)
MCH: 31.7 pg (ref 25.1–34.0)
MCHC: 32.8 g/dL (ref 31.5–36.0)
MCV: 96.5 fL (ref 79.5–101.0)
MONO#: 0.5 10*3/uL (ref 0.1–0.9)
MONO%: 17.5 % — ABNORMAL HIGH (ref 0.0–14.0)
NEUT%: 50.8 % (ref 38.4–76.8)
NEUTROS ABS: 1.4 10*3/uL — AB (ref 1.5–6.5)
Platelets: 317 10*3/uL (ref 145–400)
RBC: 3.95 10*6/uL (ref 3.70–5.45)
RDW: 16.6 % — AB (ref 11.2–14.5)
WBC: 2.8 10*3/uL — AB (ref 3.9–10.3)

## 2016-08-21 MED ORDER — METHYLPREDNISOLONE 4 MG PO TBPK: ORAL_TABLET | ORAL | 0 refills | Status: DC

## 2016-08-21 NOTE — Progress Notes (Signed)
[facility] [phone]   Fax:([phone]) [place], [place] [address]  DIAGNOSIS: Recurrent non-small cell lung cancer, adenocarcinoma initially diagnosed as stage IB (T2, N0, M0) in June 2008 with tumor size of 8.0 cm.  PRIOR THERAPY: #1 status post left upper lobectomy with lymph node dissection under the care of Dr. Arlyce Dice on 06/29/2006.  #2 status post 4 cycles of adjuvant chemotherapy with cisplatin and Taxotere last dose given 08/01/2006.  #3 status post 6 cycles of systemic chemotherapy with carboplatin and Alimta for disease recurrence last dose given 12/12/2009.  #4 palliative radiotherapy to the enlarging right upper lobe lung mass under the care of Dr. Pablo Ledger completed on 11/14/2015.  CURRENT THERAPY: Maintenance systemic chemotherapy with single agent Alimta 400 MG/M2 every 3 weeks, status post 96 cycles.  INTERVAL HISTORY: [name] 83 y.o. female came to the clinic today for follow-up visit. The patient has been complaining of increasing cough and shortness of breath since her radiation treatment and was getting worse in the last 2 weeks. She was seen at the symptom management clinic at the [facility] and was started last week on Levaquin with mild improvement in her condition. She felt much better when she was taken Medrol Dosepak in the past. She denied having any chest pain but continues to have dry cough. She has no hemoptysis. She has no significant weight loss or night sweats. She has no fever or chills. She denied having any nausea, vomiting, diarrhea or constipation. She had repeat CT scan of the chest performed recently. She is here today for evaluation before starting cycle #97 of her treatment.  MEDICAL HISTORY: Past Medical History:  Diagnosis Date  . FHx: chemotherapy 2008&2011   4 cycles cisplatin,taxotere,2008& carboplatin and Alimta 2011  . History of  migraine headaches   . Hypercholesterolemia   . lung ca dx'd 06/2006   rt and lt lung  . Lung cancer ([facility]) 3/14/1   right- Adenocarcinoma w/bronchioalveolar features  . Radiation 11/01/15-11/14/15   right upper lobe 30 gray  . Radiation pneumonitis ([facility]) 01/24/2016    ALLERGIES:  is allergic to simvastatin.  MEDICATIONS:  Current Outpatient Prescriptions  Medication Sig Dispense Refill  . albuterol (PROVENTIL HFA;VENTOLIN HFA) 108 (90 Base) MCG/ACT inhaler Inhale 1-2 puffs into the lungs every 6 (six) hours as needed for wheezing or shortness of breath. 1 Inhaler 2  . Calcium Carbonate-Vit D-Min (CALTRATE PLUS PO) Take 500 mg by mouth 2 (two) times daily. Reported on 12/13/2015    . chlorpheniramine-HYDROcodone (TUSSIONEX) 10-8 MG/5ML SUER Take 5 mLs by mouth 2 (two) times daily. 829 mL 0  . folic acid (FOLVITE) 1 MG tablet Take 1 mg by mouth daily. Reported on 12/13/2015    . levofloxacin (LEVAQUIN) 500 MG tablet Take 1 tablet (500 mg total) by mouth daily. 10 tablet 0  . Multiple Vitamins-Minerals (CENTRUM SILVER PO) Take 1 tablet by mouth daily. Reported on 12/13/2015    . Phenylephrine-DM-GG-APAP (DELSYM COUGH/COLD DAYTIME PO) Take by mouth.    . vitamin B-12 (CYANOCOBALAMIN) 500 MCG tablet Take 1,500 mcg by mouth daily. Reported on 12/13/2015    . prochlorperazine (COMPAZINE) 10 MG tablet Take 1 tablet (10 mg total) by mouth every 6 (six) hours as needed. (Patient not taking: Reported on 08/15/2016) 15 tablet 1   No current facility-administered medications for this visit.     SURGICAL HISTORY:  Past Surgical History:  Procedure Laterality Date  . CATARACT EXTRACTION  2013   bilateral  . left  lowerlung lobectomy Left 06/19/2006   Dr.Burney,Left lower lobectomy  . Porta-cath  2011    REVIEW OF SYSTEMS:  Constitutional: positive for fatigue Eyes: negative Ears, nose, mouth, throat, and face: negative Respiratory: positive for cough and dyspnea on exertion Cardiovascular:  negative Gastrointestinal: negative Genitourinary:negative Integument/breast: negative Hematologic/lymphatic: negative Musculoskeletal:negative Neurological: negative Behavioral/Psych: negative Endocrine: negative Allergic/Immunologic: negative   PHYSICAL EXAMINATION: General appearance: alert, cooperative, fatigued and no distress Head: Normocephalic, without obvious abnormality, atraumatic Neck: no adenopathy, no JVD, supple, symmetrical, trachea midline and thyroid not enlarged, symmetric, no tenderness/mass/nodules Lymph nodes: Cervical, supraclavicular, and axillary nodes normal. Resp: rales LUL and wheezes bilaterally Back: symmetric, no curvature. ROM normal. No CVA tenderness. Cardio: regular rate and rhythm, S1, S2 normal, no murmur, click, rub or gallop and normal apical impulse GI: soft, non-tender; bowel sounds normal; no masses,  no organomegaly Extremities: extremities normal, atraumatic, no cyanosis or edema Neurologic: Alert and oriented X 3, normal strength and tone. Normal symmetric reflexes. Normal coordination and gait  ECOG PERFORMANCE STATUS: 1 - Symptomatic but completely ambulatory  Blood pressure 119/77, pulse (!) 105, temperature 97.8 F (36.6 C), temperature source Oral, resp. rate 19, height '5\' 2"'$  (1.575 m), weight 129 lb (58.5 kg), SpO2 100 %.  LABORATORY DATA: Lab Results  Component Value Date   WBC 2.8 (L) 08/21/2016   HGB 12.5 08/21/2016   HCT 38.1 08/21/2016   MCV 96.5 08/21/2016   PLT 317 08/21/2016      Chemistry      Component Value Date/Time   NA 140 08/21/2016 1110   K 4.2 08/21/2016 1110   CL 108 (H) 11/04/2012 0947   CO2 24 08/21/2016 1110   BUN 15.3 08/21/2016 1110   CREATININE 0.9 08/21/2016 1110      Component Value Date/Time   CALCIUM 9.3 08/21/2016 1110   ALKPHOS 94 08/21/2016 1110   AST 22 08/21/2016 1110   ALT 11 08/21/2016 1110   BILITOT 0.36 08/21/2016 1110       RADIOGRAPHIC STUDIES: Ct Angio Chest Pe W Or Wo  Contrast  Result Date: 08/15/2016 CLINICAL DATA:  83 year old female with increasing shortness of breath and coughing. History of lung cancer diagnosed in 2017 treated with radiation therapy to the right lung. Prior history of left lower lobectomy in 2008. EXAM: CT ANGIOGRAPHY CHEST WITH CONTRAST TECHNIQUE: Multidetector CT imaging of the chest was performed using the standard protocol during bolus administration of intravenous contrast. Multiplanar CT image reconstructions and MIPs were obtained to evaluate the vascular anatomy. CONTRAST:  76 mL of Isovue-370 COMPARISON:  None. FINDINGS: Cardiovascular: There are no filling defects within the pulmonary arterial tree to suggest underlying pulmonary embolism. Heart size is normal. There is no significant pericardial fluid, thickening or pericardial calcification. There is aortic atherosclerosis, as well as atherosclerosis of the great vessels of the mediastinum and the coronary arteries, including calcified atherosclerotic plaque in the left main and left anterior descending coronary arteries. Right internal jugular single-lumen porta cath with tip terminating in the distal superior vena cava. Mediastinum/Nodes: No pathologically enlarged mediastinal or hilar lymph nodes. Esophagus is unremarkable in appearance. No axillary lymphadenopathy. Lungs/Pleura: There continues to be extensive mass-like architectural distortion in the right upper lung, predominantly involving the right upper lobe and adjacent aspect of the superior segment of the right lower lobe, most compatible with chronic postradiation mass-like fibrosis. In addition, there is a nodular area in the  inferior aspect of the lingula, which appears larger than prior studies, currently measuring 2.0 x 2.9 cm (axial image 84 of series 7) previously 17 mm in diameter on 01/23/2016), associated with increasing pleural retraction, concerning for an additional focus of malignancy. Trace right pleural effusion  lying dependently. No acute consolidative airspace disease. Upper Abdomen: Aortic atherosclerosis. Musculoskeletal: There are no aggressive appearing lytic or blastic lesions noted in the visualized portions of the skeleton. Review of the MIP images confirms the above findings. IMPRESSION: 1. No evidence of pulmonary embolism. 2. Trace right pleural effusion. 3. Enlarging nodule in the anterior aspect of the lingula currently measuring 2.0 x 2.9 cm, concerning for an additional focus of malignancy. 4. Chronic postradiation changes in the right lung redemonstrated. 5. Aortic atherosclerosis, in addition to left main and left anterior descending coronary artery disease. Electronically Signed   By: Vinnie Langton M.D.   On: 08/15/2016 17:21    ASSESSMENT AND PLAN:  This is a very pleasant 83 years old white female with recurrent non-small cell lung cancer, adenocarcinoma. She was treated in the past with systemic chemotherapy with carboplatin and Alimta with partial response and the patient is currently on maintenance treatment with single agent Alimta status post 96 cycles. She also received palliative radiotherapy to enlarging lung nodules and since her radiation therapy she has been complaining of increasing cough as well as weakness and shortness of breath likely secondary to radiation pneumonitis. She had a recent CT scan of the chest. I personally and independently reviewed the scan images can discuss the results with the patient today. Her scan showed no evidence for pulmonary embolism but there was enlarging nodule in the anterior aspect of the lingula questionable for progressive disease but inflammatory process is not excluded at this point. I recommended for the patient to continue her current treatment with maintenance Alimta but we will delay the start of cycle #97 by one week to give her time to recover from her current shortness of breath and inflammatory process. I recommended for the patient  to complete her course of treatment with Levaquin. I will also start her on Medrol Dosepak today. She would come back for follow-up visit in 4 weeks for evaluation before starting the next cycle of her treatment. She was advised to call immediately if she has any concerning symptoms in the interval. The patient voices understanding of current disease status and treatment options and is in agreement with the current care plan. All questions were answered. The patient knows to call the clinic with any problems, questions or concerns. We can certainly see the patient much sooner if necessary. Disclaimer: This note was dictated with voice recognition software. Similar sounding words can inadvertently be transcribed and may not be corrected upon review.

## 2016-08-21 NOTE — Telephone Encounter (Signed)
R/s appt per 3/21 los. Gave patient AVS and calender per 3/21.

## 2016-08-28 ENCOUNTER — Ambulatory Visit (HOSPITAL_BASED_OUTPATIENT_CLINIC_OR_DEPARTMENT_OTHER): Payer: Medicare Other

## 2016-08-28 ENCOUNTER — Other Ambulatory Visit (HOSPITAL_BASED_OUTPATIENT_CLINIC_OR_DEPARTMENT_OTHER): Payer: Medicare Other

## 2016-08-28 DIAGNOSIS — C3411 Malignant neoplasm of upper lobe, right bronchus or lung: Secondary | ICD-10-CM

## 2016-08-28 DIAGNOSIS — C3491 Malignant neoplasm of unspecified part of right bronchus or lung: Secondary | ICD-10-CM

## 2016-08-28 DIAGNOSIS — C3492 Malignant neoplasm of unspecified part of left bronchus or lung: Principal | ICD-10-CM

## 2016-08-28 DIAGNOSIS — Z5111 Encounter for antineoplastic chemotherapy: Secondary | ICD-10-CM | POA: Diagnosis not present

## 2016-08-28 LAB — COMPREHENSIVE METABOLIC PANEL
ALBUMIN: 3.2 g/dL — AB (ref 3.5–5.0)
ALT: 12 U/L (ref 0–55)
AST: 18 U/L (ref 5–34)
Alkaline Phosphatase: 94 U/L (ref 40–150)
Anion Gap: 8 mEq/L (ref 3–11)
BUN: 20.8 mg/dL (ref 7.0–26.0)
CALCIUM: 9.2 mg/dL (ref 8.4–10.4)
CHLORIDE: 104 meq/L (ref 98–109)
CO2: 27 mEq/L (ref 22–29)
CREATININE: 0.9 mg/dL (ref 0.6–1.1)
EGFR: 63 mL/min/{1.73_m2} — ABNORMAL LOW (ref >90)
GLUCOSE: 84 mg/dL (ref 70–140)
POTASSIUM: 3.7 meq/L (ref 3.5–5.1)
SODIUM: 139 meq/L (ref 136–145)
Total Bilirubin: 0.45 mg/dL (ref 0.20–1.20)
Total Protein: 7.2 g/dL (ref 6.4–8.3)

## 2016-08-28 LAB — CBC WITH DIFFERENTIAL/PLATELET
BASO%: 0.3 % (ref 0.0–2.0)
BASOS ABS: 0 10*3/uL (ref 0.0–0.1)
EOS%: 4.6 % (ref 0.0–7.0)
Eosinophils Absolute: 0.3 10*3/uL (ref 0.0–0.5)
HEMATOCRIT: 39.9 % (ref 34.8–46.6)
HEMOGLOBIN: 13.2 g/dL (ref 11.6–15.9)
LYMPH#: 2 10*3/uL (ref 0.9–3.3)
LYMPH%: 28.2 % (ref 14.0–49.7)
MCH: 31.7 pg (ref 25.1–34.0)
MCHC: 33.1 g/dL (ref 31.5–36.0)
MCV: 95.7 fL (ref 79.5–101.0)
MONO#: 0.9 10*3/uL (ref 0.1–0.9)
MONO%: 13.2 % (ref 0.0–14.0)
NEUT#: 3.8 10*3/uL (ref 1.5–6.5)
NEUT%: 53.7 % (ref 38.4–76.8)
Platelets: 261 10*3/uL (ref 145–400)
RBC: 4.17 10*6/uL (ref 3.70–5.45)
RDW: 16.7 % — AB (ref 11.2–14.5)
WBC: 7 10*3/uL (ref 3.9–10.3)

## 2016-08-28 MED ORDER — SODIUM CHLORIDE 0.9 % IJ SOLN
10.0000 mL | INTRAMUSCULAR | Status: DC | PRN
  Administered 2016-08-28: 10 mL
  Filled 2016-08-28: qty 10

## 2016-08-28 MED ORDER — SODIUM CHLORIDE 0.9 % IV SOLN
Freq: Once | INTRAVENOUS | Status: AC
  Administered 2016-08-28: 14:00:00 via INTRAVENOUS
  Filled 2016-08-28: qty 8

## 2016-08-28 MED ORDER — SODIUM CHLORIDE 0.9 % IV SOLN
Freq: Once | INTRAVENOUS | Status: AC
  Administered 2016-08-28: 14:00:00 via INTRAVENOUS

## 2016-08-28 MED ORDER — HEPARIN SOD (PORK) LOCK FLUSH 100 UNIT/ML IV SOLN
500.0000 [IU] | Freq: Once | INTRAVENOUS | Status: AC | PRN
  Administered 2016-08-28: 500 [IU]
  Filled 2016-08-28: qty 5

## 2016-08-28 MED ORDER — SODIUM CHLORIDE 0.9 % IV SOLN
400.0000 mg/m2 | Freq: Once | INTRAVENOUS | Status: AC
  Administered 2016-08-28: 675 mg via INTRAVENOUS
  Filled 2016-08-28: qty 20

## 2016-09-11 ENCOUNTER — Ambulatory Visit: Payer: Medicare Other

## 2016-09-11 ENCOUNTER — Telehealth: Payer: Self-pay | Admitting: Internal Medicine

## 2016-09-11 ENCOUNTER — Other Ambulatory Visit: Payer: Medicare Other

## 2016-09-11 ENCOUNTER — Ambulatory Visit: Payer: Medicare Other | Admitting: Internal Medicine

## 2016-09-11 NOTE — Telephone Encounter (Signed)
MM PAL - moved 4/18 f/u to LT. Patient will f/u with MM on next cycle 5/9. Spoke with patient.

## 2016-09-18 ENCOUNTER — Ambulatory Visit (HOSPITAL_BASED_OUTPATIENT_CLINIC_OR_DEPARTMENT_OTHER): Payer: Medicare Other | Admitting: Nurse Practitioner

## 2016-09-18 ENCOUNTER — Ambulatory Visit (HOSPITAL_BASED_OUTPATIENT_CLINIC_OR_DEPARTMENT_OTHER): Payer: Medicare Other

## 2016-09-18 DIAGNOSIS — R0600 Dyspnea, unspecified: Secondary | ICD-10-CM | POA: Diagnosis not present

## 2016-09-18 DIAGNOSIS — R05 Cough: Secondary | ICD-10-CM

## 2016-09-18 DIAGNOSIS — C3411 Malignant neoplasm of upper lobe, right bronchus or lung: Secondary | ICD-10-CM | POA: Diagnosis not present

## 2016-09-18 DIAGNOSIS — C3492 Malignant neoplasm of unspecified part of left bronchus or lung: Principal | ICD-10-CM

## 2016-09-18 DIAGNOSIS — Z85118 Personal history of other malignant neoplasm of bronchus and lung: Secondary | ICD-10-CM

## 2016-09-18 DIAGNOSIS — J700 Acute pulmonary manifestations due to radiation: Secondary | ICD-10-CM

## 2016-09-18 DIAGNOSIS — C3491 Malignant neoplasm of unspecified part of right bronchus or lung: Secondary | ICD-10-CM

## 2016-09-18 DIAGNOSIS — R059 Cough, unspecified: Secondary | ICD-10-CM

## 2016-09-18 DIAGNOSIS — Z5111 Encounter for antineoplastic chemotherapy: Secondary | ICD-10-CM

## 2016-09-18 LAB — COMPREHENSIVE METABOLIC PANEL
ALBUMIN: 2.9 g/dL — AB (ref 3.5–5.0)
ALT: 7 U/L (ref 0–55)
AST: 20 U/L (ref 5–34)
Alkaline Phosphatase: 82 U/L (ref 40–150)
Anion Gap: 7 mEq/L (ref 3–11)
BUN: 19.3 mg/dL (ref 7.0–26.0)
CALCIUM: 9.1 mg/dL (ref 8.4–10.4)
CO2: 26 mEq/L (ref 22–29)
Chloride: 108 mEq/L (ref 98–109)
Creatinine: 0.8 mg/dL (ref 0.6–1.1)
EGFR: 70 mL/min/{1.73_m2} — AB (ref >90)
Glucose: 97 mg/dl (ref 70–140)
Potassium: 4.9 mEq/L (ref 3.5–5.1)
Sodium: 141 mEq/L (ref 136–145)
Total Bilirubin: 0.38 mg/dL (ref 0.20–1.20)
Total Protein: 6.9 g/dL (ref 6.4–8.3)

## 2016-09-18 LAB — CBC WITH DIFFERENTIAL/PLATELET
BASO%: 0.5 % (ref 0.0–2.0)
BASOS ABS: 0 10*3/uL (ref 0.0–0.1)
EOS ABS: 0.1 10*3/uL (ref 0.0–0.5)
EOS%: 3.1 % (ref 0.0–7.0)
HEMATOCRIT: 36.5 % (ref 34.8–46.6)
HEMOGLOBIN: 12.1 g/dL (ref 11.6–15.9)
LYMPH#: 1.4 10*3/uL (ref 0.9–3.3)
LYMPH%: 33.7 % (ref 14.0–49.7)
MCH: 31.5 pg (ref 25.1–34.0)
MCHC: 33.2 g/dL (ref 31.5–36.0)
MCV: 95.1 fL (ref 79.5–101.0)
MONO#: 0.5 10*3/uL (ref 0.1–0.9)
MONO%: 11.6 % (ref 0.0–14.0)
NEUT#: 2.1 10*3/uL (ref 1.5–6.5)
NEUT%: 51.1 % (ref 38.4–76.8)
PLATELETS: 307 10*3/uL (ref 145–400)
RBC: 3.84 10*6/uL (ref 3.70–5.45)
RDW: 16.9 % — AB (ref 11.2–14.5)
WBC: 4.1 10*3/uL (ref 3.9–10.3)

## 2016-09-18 MED ORDER — HYDROCOD POLST-CPM POLST ER 10-8 MG/5ML PO SUER: 5.0000 mL | Freq: Two times a day (BID) | ORAL | 0 refills | Status: DC

## 2016-09-18 MED ORDER — SODIUM CHLORIDE 0.9 % IV SOLN: Freq: Once | INTRAVENOUS | Status: DC

## 2016-09-18 MED ORDER — CYANOCOBALAMIN 1000 MCG/ML IJ SOLN
1000.0000 ug | Freq: Once | INTRAMUSCULAR | Status: AC
  Administered 2016-09-18: 1000 ug via INTRAMUSCULAR

## 2016-09-18 MED ORDER — SODIUM CHLORIDE 0.9 % IJ SOLN
10.0000 mL | INTRAMUSCULAR | Status: DC | PRN
  Administered 2016-09-18: 10 mL
  Filled 2016-09-18: qty 10

## 2016-09-18 MED ORDER — HEPARIN SOD (PORK) LOCK FLUSH 100 UNIT/ML IV SOLN
500.0000 [IU] | Freq: Once | INTRAVENOUS | Status: AC | PRN
  Administered 2016-09-18: 500 [IU]
  Filled 2016-09-18: qty 5

## 2016-09-18 MED ORDER — HYDROCOD POLST-CPM POLST ER 10-8 MG/5ML PO SUER: 5.0000 mL | Freq: Two times a day (BID) | ORAL | 0 refills | Status: DC | PRN

## 2016-09-18 MED ORDER — CYANOCOBALAMIN 1000 MCG/ML IJ SOLN
INTRAMUSCULAR | Status: AC
  Filled 2016-09-18: qty 1

## 2016-09-18 MED ORDER — SODIUM CHLORIDE 0.9 % IV SOLN
400.0000 mg/m2 | Freq: Once | INTRAVENOUS | Status: AC
  Administered 2016-09-18: 675 mg via INTRAVENOUS
  Filled 2016-09-18: qty 20

## 2016-09-18 MED ORDER — SODIUM CHLORIDE 0.9 % IV SOLN
Freq: Once | INTRAVENOUS | Status: AC
  Administered 2016-09-18: 13:00:00 via INTRAVENOUS
  Filled 2016-09-18: qty 8

## 2016-09-18 NOTE — Progress Notes (Signed)
  [facility] OFFICE PROGRESS NOTE   DIAGNOSIS: Recurrent non-small cell lung cancer, adenocarcinoma initially diagnosed as stage IB (T2, N0, M0) in June 2008 with tumor size of 8.0 cm.  PRIOR THERAPY: #1 status post left upper lobectomy with lymph node dissection under the care of Dr. Arlyce Dice on 06/29/2006.  #2 status post 4 cycles of adjuvant chemotherapy with cisplatin and Taxotere last dose given 08/01/2006.  #3 status post 6 cycles of systemic chemotherapy with carboplatin and Alimta for disease recurrence last dose given 12/12/2009. #4 palliative radiotherapy to the enlarging right upper lobe lung mass under the care of Dr. Pablo Ledger completed on 11/14/2015.  CURRENT THERAPY: Maintenance systemic chemotherapy with single agent Alimta 400 MG/M2 every 3 weeks, status post 97 cycles.  INTERVAL HISTORY:   Ms. Mapel returns as scheduled. She completed cycle 97 maintenance Alimta 08/28/2016. She reports the dyspnea is better. She continues to have a cough. She takes Tussionex once a day, at nighttime, to help her sleep. No hemoptysis. She denies nausea/vomiting. No mouth sores. No diarrhea or constipation. No rash. She describes her appetite as "fair". She has an alteration in taste and smell.  Objective:  Vital signs in last 24 hours:  Blood pressure 121/73, pulse 86, temperature 97.6 F (36.4 C), resp. rate 18, weight 127 lb 9 oz (57.9 kg), SpO2 100 %.    HEENT: No thrush or ulcers. Resp: Lungs clear bilaterally. Cardio: Regular rate and rhythm. GI: Abdomen soft and nontender. No hepatomegaly. Vascular: No leg edema. Neuro: Alert and oriented.  Skin: No rash. Port-A-Cath without erythema.    Lab Results:  Lab Results  Component Value Date   WBC 4.1 09/18/2016   HGB 12.1 09/18/2016   HCT 36.5 09/18/2016   MCV 95.1 09/18/2016   PLT 307 09/18/2016   NEUTROABS 2.1 09/18/2016    Imaging:  No results found.  Medications: I have reviewed the patient's  current medications.  Assessment/Plan: 1. Recurrent non-small cell lung cancer, adenocarcinoma. Treated in the past with systemic chemotherapy with carboplatin and Alimta with a partial response. She is currently on maintenance treatment with single agent Alimta. She also received palliative radiation to an enlarging right upper lobe lung mass completed 11/14/2015. Recent CT scan of the test showed an enlarging nodule in the anterior aspect of the lingula questionable for progressive disease but inflammatory process not excluded. Maintenance Alimta continued with cycle 97 completed 08/28/2016. 2. Dyspnea/cough. Improved status post Levaquin and a Medrol Dosepak. She continues Tussionex once a day.   Disposition: [name] appears stable. She has completed 97 cycles of maintenance Alimta. Plan to proceed with cycle 98 today as scheduled. She will return for a follow-up visit and chemotherapy in 3 weeks. She will contact the office in the interim with any problems.  Plan reviewed with Dr. Julien Nordmann.    Ned Card ANP/GNP-BC   09/18/2016  12:52 PM

## 2016-09-18 NOTE — Patient Instructions (Signed)
[facility] Discharge Instructions for Patients Receiving Chemotherapy  Today you received the following chemotherapy agents Alimta.  To help prevent nausea and vomiting after your treatment, we encourage you to take your nausea medication as prescribed.   If you develop nausea and vomiting that is not controlled by your nausea medication, call the clinic.   BELOW ARE SYMPTOMS THAT SHOULD BE REPORTED IMMEDIATELY:  *FEVER GREATER THAN 100.5 F  *CHILLS WITH OR WITHOUT FEVER  NAUSEA AND VOMITING THAT IS NOT CONTROLLED WITH YOUR NAUSEA MEDICATION  *UNUSUAL SHORTNESS OF BREATH  *UNUSUAL BRUISING OR BLEEDING  TENDERNESS IN MOUTH AND THROAT WITH OR WITHOUT PRESENCE OF ULCERS  *URINARY PROBLEMS  *BOWEL PROBLEMS  UNUSUAL RASH Items with * indicate a potential emergency and should be followed up as soon as possible.  Feel free to call the clinic you have any questions or concerns. The clinic phone number is [phone].  Please show the CHEMO ALERT CARD at check-in to the Emergency Department and triage nurse.   

## 2016-09-18 NOTE — Addendum Note (Signed)
Addended by: Jethro Bolus A on: 09/18/2016 01:17 PM   Modules accepted: Orders

## 2016-10-02 ENCOUNTER — Ambulatory Visit: Payer: Medicare Other | Admitting: Internal Medicine

## 2016-10-02 ENCOUNTER — Ambulatory Visit: Payer: Medicare Other

## 2016-10-02 ENCOUNTER — Other Ambulatory Visit: Payer: Medicare Other

## 2016-10-08 ENCOUNTER — Other Ambulatory Visit: Payer: Self-pay | Admitting: Medical Oncology

## 2016-10-08 DIAGNOSIS — C3491 Malignant neoplasm of unspecified part of right bronchus or lung: Secondary | ICD-10-CM

## 2016-10-08 DIAGNOSIS — C3492 Malignant neoplasm of unspecified part of left bronchus or lung: Principal | ICD-10-CM

## 2016-10-09 ENCOUNTER — Telehealth: Payer: Self-pay | Admitting: Internal Medicine

## 2016-10-09 ENCOUNTER — Ambulatory Visit (HOSPITAL_BASED_OUTPATIENT_CLINIC_OR_DEPARTMENT_OTHER): Payer: Medicare Other

## 2016-10-09 ENCOUNTER — Encounter: Payer: Self-pay | Admitting: Internal Medicine

## 2016-10-09 ENCOUNTER — Ambulatory Visit (HOSPITAL_BASED_OUTPATIENT_CLINIC_OR_DEPARTMENT_OTHER): Payer: Medicare Other | Admitting: Internal Medicine

## 2016-10-09 ENCOUNTER — Other Ambulatory Visit (HOSPITAL_BASED_OUTPATIENT_CLINIC_OR_DEPARTMENT_OTHER): Payer: Medicare Other

## 2016-10-09 VITALS — BP 120/72 | HR 78 | Temp 98.6°F | Resp 19 | Ht 62.0 in | Wt 127.8 lb

## 2016-10-09 DIAGNOSIS — C3491 Malignant neoplasm of unspecified part of right bronchus or lung: Secondary | ICD-10-CM

## 2016-10-09 DIAGNOSIS — Z85118 Personal history of other malignant neoplasm of bronchus and lung: Secondary | ICD-10-CM | POA: Diagnosis not present

## 2016-10-09 DIAGNOSIS — C3492 Malignant neoplasm of unspecified part of left bronchus or lung: Principal | ICD-10-CM

## 2016-10-09 DIAGNOSIS — C3411 Malignant neoplasm of upper lobe, right bronchus or lung: Secondary | ICD-10-CM | POA: Diagnosis not present

## 2016-10-09 DIAGNOSIS — Z5111 Encounter for antineoplastic chemotherapy: Secondary | ICD-10-CM

## 2016-10-09 LAB — CBC WITH DIFFERENTIAL/PLATELET
BASO%: 0.5 % (ref 0.0–2.0)
BASOS ABS: 0 10*3/uL (ref 0.0–0.1)
EOS ABS: 0.1 10*3/uL (ref 0.0–0.5)
EOS%: 1.2 % (ref 0.0–7.0)
HEMATOCRIT: 34.8 % (ref 34.8–46.6)
HGB: 11.2 g/dL — ABNORMAL LOW (ref 11.6–15.9)
LYMPH#: 1.5 10*3/uL (ref 0.9–3.3)
LYMPH%: 36.5 % (ref 14.0–49.7)
MCH: 32.1 pg (ref 25.1–34.0)
MCHC: 32.2 g/dL (ref 31.5–36.0)
MCV: 99.7 fL (ref 79.5–101.0)
MONO#: 0.6 10*3/uL (ref 0.1–0.9)
MONO%: 14.1 % — ABNORMAL HIGH (ref 0.0–14.0)
NEUT#: 2 10*3/uL (ref 1.5–6.5)
NEUT%: 47.7 % (ref 38.4–76.8)
PLATELETS: 322 10*3/uL (ref 145–400)
RBC: 3.49 10*6/uL — ABNORMAL LOW (ref 3.70–5.45)
RDW: 17 % — ABNORMAL HIGH (ref 11.2–14.5)
WBC: 4.1 10*3/uL (ref 3.9–10.3)

## 2016-10-09 LAB — COMPREHENSIVE METABOLIC PANEL
ALBUMIN: 3.2 g/dL — AB (ref 3.5–5.0)
ALK PHOS: 77 U/L (ref 40–150)
ALT: 11 U/L (ref 0–55)
AST: 22 U/L (ref 5–34)
Anion Gap: 5 mEq/L (ref 3–11)
BUN: 13.9 mg/dL (ref 7.0–26.0)
CO2: 27 mEq/L (ref 22–29)
CREATININE: 0.8 mg/dL (ref 0.6–1.1)
Calcium: 9.1 mg/dL (ref 8.4–10.4)
Chloride: 108 mEq/L (ref 98–109)
EGFR: 66 mL/min/{1.73_m2} — AB (ref >90)
GLUCOSE: 112 mg/dL (ref 70–140)
Potassium: 4.4 mEq/L (ref 3.5–5.1)
Sodium: 140 mEq/L (ref 136–145)
TOTAL PROTEIN: 7 g/dL (ref 6.4–8.3)
Total Bilirubin: 0.58 mg/dL (ref 0.20–1.20)

## 2016-10-09 MED ORDER — SODIUM CHLORIDE 0.9 % IJ SOLN
10.0000 mL | INTRAMUSCULAR | Status: DC | PRN
  Administered 2016-10-09: 10 mL
  Filled 2016-10-09: qty 10

## 2016-10-09 MED ORDER — CYANOCOBALAMIN 1000 MCG/ML IJ SOLN
INTRAMUSCULAR | Status: AC
  Filled 2016-10-09: qty 1

## 2016-10-09 MED ORDER — CYANOCOBALAMIN 1000 MCG/ML IJ SOLN: 1000.0000 ug | Freq: Once | INTRAMUSCULAR | Status: DC

## 2016-10-09 MED ORDER — HEPARIN SOD (PORK) LOCK FLUSH 100 UNIT/ML IV SOLN
500.0000 [IU] | Freq: Once | INTRAVENOUS | Status: AC | PRN
  Administered 2016-10-09: 500 [IU]
  Filled 2016-10-09: qty 5

## 2016-10-09 MED ORDER — SODIUM CHLORIDE 0.9 % IV SOLN
Freq: Once | INTRAVENOUS | Status: AC
  Administered 2016-10-09: 13:00:00 via INTRAVENOUS

## 2016-10-09 MED ORDER — SODIUM CHLORIDE 0.9 % IV SOLN
400.0000 mg/m2 | Freq: Once | INTRAVENOUS | Status: AC
  Administered 2016-10-09: 675 mg via INTRAVENOUS
  Filled 2016-10-09: qty 20

## 2016-10-09 MED ORDER — SODIUM CHLORIDE 0.9 % IV SOLN
Freq: Once | INTRAVENOUS | Status: AC
  Administered 2016-10-09: 14:00:00 via INTRAVENOUS
  Filled 2016-10-09: qty 8

## 2016-10-09 NOTE — Progress Notes (Signed)
[facility] Telephone:(336) [phone]   Fax:(336) [phone]  OFFICE PROGRESS NOTE  [name], MD [place] [address]  DIAGNOSIS: Recurrent non-small cell lung cancer, adenocarcinoma initially diagnosed as stage IB (T2, N0, M0) in June 2008 with tumor size of 8.0 cm.  PRIOR THERAPY: #1 status post left upper lobectomy with lymph node dissection under the care of Dr. Arlyce Dice on 06/29/2006.  #2 status post 4 cycles of adjuvant chemotherapy with cisplatin and Taxotere last dose given 08/01/2006.  #3 status post 6 cycles of systemic chemotherapy with carboplatin and Alimta for disease recurrence last dose given 12/12/2009.  #4 palliative radiotherapy to the enlarging right upper lobe lung mass under the care of Dr. Pablo Ledger completed on 11/14/2015.  CURRENT THERAPY: Maintenance systemic chemotherapy with single agent Alimta 400 MG/M2 every 3 weeks, status post 98 cycles.  INTERVAL HISTORY: JACQULINE [name] 83 y.o. female returns to the clinic today for follow-up visit. The patient is feeling fine today was no specific complaints except for intermittent dry cough. She denied having any chest pain, shortness of breath or hemoptysis. She denied having any recent weight loss or night sweats. She has no nausea, vomiting, diarrhea or constipation. She has no fever or chills. She is here today for evaluation before starting cycle #99.  MEDICAL HISTORY: Past Medical History:  Diagnosis Date  . FHx: chemotherapy 2008&2011   4 cycles cisplatin,taxotere,2008& carboplatin and Alimta 2011  . History of migraine headaches   . Hypercholesterolemia   . lung ca dx'd 06/2006   rt and lt lung  . Lung cancer ([facility]) 3/14/1   right- Adenocarcinoma w/bronchioalveolar features  . Radiation 11/01/15-11/14/15   right upper lobe 30 gray  . Radiation pneumonitis ([facility]) 01/24/2016    ALLERGIES:  is allergic to simvastatin.  MEDICATIONS:  Current Outpatient Prescriptions    Medication Sig Dispense Refill  . albuterol (PROVENTIL HFA;VENTOLIN HFA) 108 (90 Base) MCG/ACT inhaler Inhale 1-2 puffs into the lungs every 6 (six) hours as needed for wheezing or shortness of breath. 1 Inhaler 2  . Calcium Carbonate-Vit D-Min (CALTRATE PLUS PO) Take 500 mg by mouth 2 (two) times daily. Reported on 12/13/2015    . chlorpheniramine-HYDROcodone (TUSSIONEX) 10-8 MG/5ML SUER Take 5 mLs by mouth 2 (two) times daily as needed for cough. 564 mL 0  . folic acid (FOLVITE) 1 MG tablet Take 1 mg by mouth daily. Reported on 12/13/2015    . Multiple Vitamins-Minerals (CENTRUM SILVER PO) Take 1 tablet by mouth daily. Reported on 12/13/2015    . vitamin B-12 (CYANOCOBALAMIN) 500 MCG tablet Take 1,500 mcg by mouth daily. Reported on 12/13/2015    . prochlorperazine (COMPAZINE) 10 MG tablet Take 1 tablet (10 mg total) by mouth every 6 (six) hours as needed. (Patient not taking: Reported on 08/15/2016) 15 tablet 1   No current facility-administered medications for this visit.     SURGICAL HISTORY:  Past Surgical History:  Procedure Laterality Date  . CATARACT EXTRACTION  2013   bilateral  . left  lowerlung lobectomy Left 06/19/2006   Dr.Burney,Left lower lobectomy  . Porta-cath  2011    REVIEW OF SYSTEMS:  A comprehensive review of systems was negative except for: Respiratory: positive for cough   PHYSICAL EXAMINATION: General appearance: alert, cooperative and no distress Head: Normocephalic, without obvious abnormality, atraumatic Neck: no adenopathy, no JVD, supple, symmetrical, trachea midline and thyroid not enlarged, symmetric, no tenderness/mass/nodules Lymph nodes: Cervical, supraclavicular, and axillary nodes normal.  Resp: rales LUL and wheezes bilaterally Back: symmetric, no curvature. ROM normal. No CVA tenderness. Cardio: regular rate and rhythm, S1, S2 normal, no murmur, click, rub or gallop and normal apical impulse GI: soft, non-tender; bowel sounds normal; no masses,  no  organomegaly Extremities: extremities normal, atraumatic, no cyanosis or edema  ECOG PERFORMANCE STATUS: 1 - Symptomatic but completely ambulatory  Blood pressure 120/72, pulse 78, temperature 98.6 F (37 C), temperature source Oral, resp. rate 19, height '5\' 2"'$  (1.575 m), weight 127 lb 12.8 oz (58 kg), SpO2 100 %.  LABORATORY DATA: Lab Results  Component Value Date   WBC 4.1 10/09/2016   HGB 11.2 (L) 10/09/2016   HCT 34.8 10/09/2016   MCV 99.7 10/09/2016   PLT 322 10/09/2016      Chemistry      Component Value Date/Time   NA 140 10/09/2016 1138   K 4.4 10/09/2016 1138   CL 108 (H) 11/04/2012 0947   CO2 27 10/09/2016 1138   BUN 13.9 10/09/2016 1138   CREATININE 0.8 10/09/2016 1138      Component Value Date/Time   CALCIUM 9.1 10/09/2016 1138   ALKPHOS 77 10/09/2016 1138   AST 22 10/09/2016 1138   ALT 11 10/09/2016 1138   BILITOT 0.58 10/09/2016 1138       RADIOGRAPHIC STUDIES: No results found.  ASSESSMENT AND PLAN:  This is a very pleasant 83 years old white female with recurrent non-small cell lung cancer, adenocarcinoma status post induction systemic chemotherapy with carboplatin and Alimta with partial response. The patient was started on maintenance treatment with single agent Alimta status post 98 cycles. She has been tolerating her treatment well with no significant adverse effects.  I recommended for her to proceed with cycle #99 today as a scheduled. I will see her back for follow-up visit in 3 weeks for evaluation after repeating CT scan of the chest for restaging of her disease. She was advised to call immediately if she has any concerning symptoms in the interval. The patient voices understanding of current disease status and treatment options and is in agreement with the current care plan. All questions were answered. The patient knows to call the clinic with any problems, questions or concerns. We can certainly see the patient much sooner if necessary. I  spent 10 minutes counseling the patient face to face. The total time spent in the appointment was 15 minutes.  Disclaimer: This note was dictated with voice recognition software. Similar sounding words can inadvertently be transcribed and may not be corrected upon review.

## 2016-10-09 NOTE — Telephone Encounter (Signed)
Gave patient AVS and calender per 5/9 los. F/u scheduled with Lattie Haw on 5/30 MM no availability . [facility] to contact patient with CT

## 2016-10-29 ENCOUNTER — Ambulatory Visit (HOSPITAL_COMMUNITY)
Admission: RE | Admit: 2016-10-29 | Discharge: 2016-10-29 | Disposition: A | Discharge status: Home / Self Care | Payer: Medicare Other | Source: Ambulatory Visit | Attending: Internal Medicine | Admitting: Internal Medicine

## 2016-10-29 ENCOUNTER — Other Ambulatory Visit: Payer: Self-pay | Admitting: Internal Medicine

## 2016-10-29 DIAGNOSIS — I2510 Atherosclerotic heart disease of native coronary artery without angina pectoris: Secondary | ICD-10-CM | POA: Diagnosis not present

## 2016-10-29 DIAGNOSIS — Z5111 Encounter for antineoplastic chemotherapy: Secondary | ICD-10-CM

## 2016-10-29 DIAGNOSIS — C3492 Malignant neoplasm of unspecified part of left bronchus or lung: Principal | ICD-10-CM

## 2016-10-29 DIAGNOSIS — Z902 Acquired absence of lung [part of]: Secondary | ICD-10-CM | POA: Diagnosis not present

## 2016-10-29 DIAGNOSIS — I700 Atherosclerosis of aorta: Secondary | ICD-10-CM | POA: Diagnosis not present

## 2016-10-29 DIAGNOSIS — C3491 Malignant neoplasm of unspecified part of right bronchus or lung: Secondary | ICD-10-CM

## 2016-10-29 MED ORDER — IOPAMIDOL (ISOVUE-300) INJECTION 61%
INTRAVENOUS | Status: AC
  Filled 2016-10-29: qty 75

## 2016-10-30 ENCOUNTER — Other Ambulatory Visit (HOSPITAL_BASED_OUTPATIENT_CLINIC_OR_DEPARTMENT_OTHER): Payer: Medicare Other

## 2016-10-30 ENCOUNTER — Ambulatory Visit (HOSPITAL_BASED_OUTPATIENT_CLINIC_OR_DEPARTMENT_OTHER): Payer: Medicare Other

## 2016-10-30 ENCOUNTER — Ambulatory Visit (HOSPITAL_BASED_OUTPATIENT_CLINIC_OR_DEPARTMENT_OTHER): Payer: Medicare Other | Admitting: Nurse Practitioner

## 2016-10-30 VITALS — BP 150/71 | HR 74 | Temp 98.0°F | Resp 18 | Ht 62.0 in | Wt 126.7 lb

## 2016-10-30 DIAGNOSIS — C3492 Malignant neoplasm of unspecified part of left bronchus or lung: Principal | ICD-10-CM

## 2016-10-30 DIAGNOSIS — Z5111 Encounter for antineoplastic chemotherapy: Secondary | ICD-10-CM

## 2016-10-30 DIAGNOSIS — C3411 Malignant neoplasm of upper lobe, right bronchus or lung: Secondary | ICD-10-CM

## 2016-10-30 DIAGNOSIS — R05 Cough: Secondary | ICD-10-CM | POA: Diagnosis not present

## 2016-10-30 DIAGNOSIS — C3491 Malignant neoplasm of unspecified part of right bronchus or lung: Secondary | ICD-10-CM

## 2016-10-30 DIAGNOSIS — J700 Acute pulmonary manifestations due to radiation: Secondary | ICD-10-CM

## 2016-10-30 LAB — CBC WITH DIFFERENTIAL/PLATELET
BASO%: 0.4 % (ref 0.0–2.0)
BASOS ABS: 0 10*3/uL (ref 0.0–0.1)
EOS%: 1 % (ref 0.0–7.0)
Eosinophils Absolute: 0.1 10*3/uL (ref 0.0–0.5)
HEMATOCRIT: 35.6 % (ref 34.8–46.6)
HGB: 11.5 g/dL — ABNORMAL LOW (ref 11.6–15.9)
LYMPH#: 1.6 10*3/uL (ref 0.9–3.3)
LYMPH%: 32.9 % (ref 14.0–49.7)
MCH: 32.7 pg (ref 25.1–34.0)
MCHC: 32.3 g/dL (ref 31.5–36.0)
MCV: 101.1 fL — ABNORMAL HIGH (ref 79.5–101.0)
MONO#: 0.6 10*3/uL (ref 0.1–0.9)
MONO%: 12.7 % (ref 0.0–14.0)
NEUT#: 2.6 10*3/uL (ref 1.5–6.5)
NEUT%: 53 % (ref 38.4–76.8)
PLATELETS: 307 10*3/uL (ref 145–400)
RBC: 3.52 10*6/uL — ABNORMAL LOW (ref 3.70–5.45)
RDW: 16.6 % — ABNORMAL HIGH (ref 11.2–14.5)
WBC: 4.9 10*3/uL (ref 3.9–10.3)

## 2016-10-30 LAB — COMPREHENSIVE METABOLIC PANEL
ALT: 10 U/L (ref 0–55)
ANION GAP: 7 meq/L (ref 3–11)
AST: 21 U/L (ref 5–34)
Albumin: 3.3 g/dL — ABNORMAL LOW (ref 3.5–5.0)
Alkaline Phosphatase: 74 U/L (ref 40–150)
BUN: 20.8 mg/dL (ref 7.0–26.0)
CALCIUM: 9.3 mg/dL (ref 8.4–10.4)
CHLORIDE: 109 meq/L (ref 98–109)
CO2: 26 mEq/L (ref 22–29)
Creatinine: 0.8 mg/dL (ref 0.6–1.1)
EGFR: 70 mL/min/{1.73_m2} — ABNORMAL LOW (ref >90)
Glucose: 84 mg/dl (ref 70–140)
POTASSIUM: 4.4 meq/L (ref 3.5–5.1)
Sodium: 142 mEq/L (ref 136–145)
Total Bilirubin: 0.39 mg/dL (ref 0.20–1.20)
Total Protein: 7 g/dL (ref 6.4–8.3)

## 2016-10-30 MED ORDER — HEPARIN SOD (PORK) LOCK FLUSH 100 UNIT/ML IV SOLN
500.0000 [IU] | Freq: Once | INTRAVENOUS | Status: AC | PRN
  Administered 2016-10-30: 500 [IU]
  Filled 2016-10-30: qty 5

## 2016-10-30 MED ORDER — DEXAMETHASONE SODIUM PHOSPHATE 100 MG/10ML IJ SOLN
Freq: Once | INTRAMUSCULAR | Status: AC
  Administered 2016-10-30: 15:00:00 via INTRAVENOUS
  Filled 2016-10-30: qty 8

## 2016-10-30 MED ORDER — SODIUM CHLORIDE 0.9 % IV SOLN
400.0000 mg/m2 | Freq: Once | INTRAVENOUS | Status: AC
  Administered 2016-10-30: 675 mg via INTRAVENOUS
  Filled 2016-10-30: qty 20

## 2016-10-30 MED ORDER — SODIUM CHLORIDE 0.9 % IV SOLN
Freq: Once | INTRAVENOUS | Status: AC
  Administered 2016-10-30: 15:00:00 via INTRAVENOUS

## 2016-10-30 MED ORDER — SODIUM CHLORIDE 0.9 % IJ SOLN
10.0000 mL | INTRAMUSCULAR | Status: DC | PRN
  Administered 2016-10-30: 10 mL
  Filled 2016-10-30: qty 10

## 2016-10-30 NOTE — Progress Notes (Addendum)
[place] OFFICE PROGRESS NOTE   DIAGNOSIS: Recurrent non-small cell lung cancer, adenocarcinoma initially diagnosed as stage IB (T2, N0, M0) in June 2008 with tumor size of 8.0 cm.  PRIOR THERAPY: #1 status post left upper lobectomy with lymph node dissection under the care of Dr. Arlyce Dice on 06/29/2006.  #2 status post 4 cycles of adjuvant chemotherapy with cisplatin and Taxotere last dose given 08/01/2006.  #3 status post 6 cycles of systemic chemotherapy with carboplatin and Alimta for disease recurrence last dose given 12/12/2009. #4 palliative radiotherapy to the enlarging right upper lobe lung mass under the care of Dr. Pablo Ledger completed on 11/14/2015.  CURRENT THERAPY: Maintenance systemic chemotherapy with single agent Alimta 400 MG/M2 every 3 weeks, status post 99 cycles.  INTERVAL HISTORY:   [name] returns as scheduled. She completed cycle 99 single agent Alimta 10/09/2016. She denies nausea/vomiting. No mouth sores. No diarrhea. No rash. No pain. No significant shortness of breath. She has a cough intermittently. She attributes the cough to "radiation".  Objective:  Vital signs in last 24 hours:  Blood pressure (!) 150/71, pulse 74, temperature 98 F (36.7 C), temperature source Oral, resp. rate 18, height 5\' 2"  (1.575 m), weight 126 lb 11.2 oz (57.5 kg), SpO2 100 %.    HEENT: No thrush or ulcers. Lymphatics: No palpable cervical or supra-clavicular lymph nodes. Resp: Lungs clear bilaterally. Cardio: Regular rate and rhythm. GI: Abdomen soft and nontender. No hepatomegaly. Vascular: No leg edema. Neuro: Alert and oriented.  Skin: No rash. Port-A-Cath without erythema.    Lab Results:  Lab Results  Component Value Date   WBC 4.9 10/30/2016   HGB 11.5 (L) 10/30/2016   HCT 35.6 10/30/2016   MCV 101.1 (H) 10/30/2016   PLT 307 10/30/2016   NEUTROABS 2.6 10/30/2016    Imaging:  Ct Chest Wo Contrast  Result Date: 10/29/2016 CLINICAL  DATA:  Left lower lobectomy, radiation therapy, and chemotherapy. Bilateral lung cancer. EXAM: CT CHEST WITHOUT CONTRAST TECHNIQUE: Multidetector CT imaging of the chest was performed following the standard protocol without IV contrast. COMPARISON:  08/15/2016 FINDINGS: Cardiovascular: A right-sided Port-A-Cath which terminates at the low SVC or caval/atrial junction. Normal heart size, without pericardial effusion. Multivessel coronary artery atherosclerosis. Mediastinum/Nodes: No supraclavicular adenopathy. No mediastinal or definite hilar adenopathy, given limitations of unenhanced CT. Lungs/Pleura: Trace bilateral pleural thickening. Mild centrilobular emphysema. Status post left lower lobectomy. Right upper and minimal superior segment right lower lobe radiation induced consolidation again identified. No well-defined residual or recurrent disease. Areas of mild nodularity along the periphery of the presumed radiation induced consolidation posteriorly are felt to be similar, including on images 40 and 45/series 7. A 4 mm left upper lobe pulmonary nodule is unchanged on image 48/ series 7. Lingular soft tissue density measures 1.7 x 2.5 cm on image 78/series 7. Compare 2.0 x 2.9 cm on the prior. Upper Abdomen: Normal imaged portions of the liver, spleen, stomach, adrenal glands, kidneys. Musculoskeletal: No acute osseous abnormality. IMPRESSION: 1. Similar appearance of presumed radiation fibrosis in the right upper and less so right lower lobes. 2. Status post left lower lobectomy. Decrease in an lingular area of nodular consolidation. This could represent improving neoplasia or infection. 3.  Coronary artery atherosclerosis. Aortic atherosclerosis. Electronically Signed   By: Abigail Miyamoto M.D.   On: 10/29/2016 14:27    Medications: I have reviewed the patient's current medications.  Assessment/Plan: 1. Recurrent non-small cell lung cancer, adenocarcinoma. Treated in the past with systemic chemotherapy  with carboplatin and Alimta with a partial response. She is currently on maintenance treatment with single agent Alimta. She also received palliative radiation to an enlarging right upper lobe lung mass completed 11/14/2015. Maintenance Alimta continued with cycle 99 completed 10/09/2016. Restaging chest CT 10/29/2016 with no evidence of disease progression.   Disposition: [name] appears stable. She has completed 99 cycles of maintenance Alimta. Recent restaging chest CT shows no evidence of disease progression. Plan to proceed with cycle 100 Alimta today as scheduled. She will return for a follow-up visit in 3 weeks.  Patient seen with Dr. Julien Nordmann.    Ned Card ANP/GNP-BC   10/30/2016  2:54 PM  ADDENDUM: Hematology/Oncology Attending: The patient is seen and examined. I recommended her care plan. She is a very pleasant 83 years old white female with recurrent non-small cell lung cancer, adenocarcinoma. She is currently undergoing maintenance treatment with single agent Alimta status post 99 cycles and she is expected to start cycle 100 today. The patient has been tolerating her treatment well with no significant adverse effects. She had repeat CT scan of the chest performed recently. I personally and independently reviewed the scan images and discuss the results with the patient today. Her scan showed no evidence for disease progression. I recommended for the patient to continue her current treatment with Alimta and she will proceed with cycle #100 today as a scheduled. For the dry cough she will continue on Tussionex. She will come back for follow-up visit in 3 weeks for evaluation before starting the next cycle of her treatment. The patient was advised to call immediately if she has any concerning symptoms in the interval.  Disclaimer: This note was dictated with voice recognition software. Similar sounding words can inadvertently be transcribed and may be missed upon  review. [name]., MD 10/31/16

## 2016-10-30 NOTE — Patient Instructions (Signed)
[facility] Discharge Instructions for Patients Receiving Chemotherapy  Today you received the following chemotherapy agents Alimta.  To help prevent nausea and vomiting after your treatment, we encourage you to take your nausea medication as prescribed.   If you develop nausea and vomiting that is not controlled by your nausea medication, call the clinic.   BELOW ARE SYMPTOMS THAT SHOULD BE REPORTED IMMEDIATELY:  *FEVER GREATER THAN 100.5 F  *CHILLS WITH OR WITHOUT FEVER  NAUSEA AND VOMITING THAT IS NOT CONTROLLED WITH YOUR NAUSEA MEDICATION  *UNUSUAL SHORTNESS OF BREATH  *UNUSUAL BRUISING OR BLEEDING  TENDERNESS IN MOUTH AND THROAT WITH OR WITHOUT PRESENCE OF ULCERS  *URINARY PROBLEMS  *BOWEL PROBLEMS  UNUSUAL RASH Items with * indicate a potential emergency and should be followed up as soon as possible.  Feel free to call the clinic you have any questions or concerns. The clinic phone number is [phone].  Please show the CHEMO ALERT CARD at check-in to the Emergency Department and triage nurse.   

## 2016-11-20 ENCOUNTER — Ambulatory Visit (HOSPITAL_BASED_OUTPATIENT_CLINIC_OR_DEPARTMENT_OTHER): Payer: Medicare Other | Admitting: Internal Medicine

## 2016-11-20 ENCOUNTER — Ambulatory Visit (HOSPITAL_BASED_OUTPATIENT_CLINIC_OR_DEPARTMENT_OTHER): Payer: Medicare Other

## 2016-11-20 ENCOUNTER — Encounter: Payer: Self-pay | Admitting: Internal Medicine

## 2016-11-20 ENCOUNTER — Other Ambulatory Visit (HOSPITAL_BASED_OUTPATIENT_CLINIC_OR_DEPARTMENT_OTHER): Payer: Medicare Other

## 2016-11-20 ENCOUNTER — Telehealth: Payer: Self-pay | Admitting: Internal Medicine

## 2016-11-20 VITALS — BP 145/61 | HR 70 | Temp 97.4°F | Resp 18 | Ht 62.0 in | Wt 125.2 lb

## 2016-11-20 DIAGNOSIS — C3411 Malignant neoplasm of upper lobe, right bronchus or lung: Secondary | ICD-10-CM | POA: Diagnosis not present

## 2016-11-20 DIAGNOSIS — Z5111 Encounter for antineoplastic chemotherapy: Secondary | ICD-10-CM

## 2016-11-20 DIAGNOSIS — C3491 Malignant neoplasm of unspecified part of right bronchus or lung: Secondary | ICD-10-CM

## 2016-11-20 DIAGNOSIS — Z85118 Personal history of other malignant neoplasm of bronchus and lung: Secondary | ICD-10-CM

## 2016-11-20 DIAGNOSIS — C3492 Malignant neoplasm of unspecified part of left bronchus or lung: Principal | ICD-10-CM

## 2016-11-20 LAB — CBC WITH DIFFERENTIAL/PLATELET
BASO%: 0.8 % (ref 0.0–2.0)
BASOS ABS: 0 10*3/uL (ref 0.0–0.1)
EOS ABS: 0.1 10*3/uL (ref 0.0–0.5)
EOS%: 1.7 % (ref 0.0–7.0)
HEMATOCRIT: 35.3 % (ref 34.8–46.6)
HEMOGLOBIN: 11.7 g/dL (ref 11.6–15.9)
LYMPH%: 35.3 % (ref 14.0–49.7)
MCH: 33.6 pg (ref 25.1–34.0)
MCHC: 33.3 g/dL (ref 31.5–36.0)
MCV: 101 fL (ref 79.5–101.0)
MONO#: 0.4 10*3/uL (ref 0.1–0.9)
MONO%: 13.6 % (ref 0.0–14.0)
NEUT#: 1.5 10*3/uL (ref 1.5–6.5)
NEUT%: 48.6 % (ref 38.4–76.8)
Platelets: 233 10*3/uL (ref 145–400)
RBC: 3.49 10*6/uL — ABNORMAL LOW (ref 3.70–5.45)
RDW: 15.7 % — AB (ref 11.2–14.5)
WBC: 3.1 10*3/uL — ABNORMAL LOW (ref 3.9–10.3)
lymph#: 1.1 10*3/uL (ref 0.9–3.3)

## 2016-11-20 LAB — COMPREHENSIVE METABOLIC PANEL
ALBUMIN: 3.2 g/dL — AB (ref 3.5–5.0)
ALK PHOS: 71 U/L (ref 40–150)
ALT: 10 U/L (ref 0–55)
AST: 21 U/L (ref 5–34)
Anion Gap: 9 mEq/L (ref 3–11)
BUN: 12.8 mg/dL (ref 7.0–26.0)
CALCIUM: 9.2 mg/dL (ref 8.4–10.4)
CHLORIDE: 108 meq/L (ref 98–109)
CO2: 23 mEq/L (ref 22–29)
Creatinine: 0.8 mg/dL (ref 0.6–1.1)
EGFR: 65 mL/min/{1.73_m2} — ABNORMAL LOW (ref >90)
Glucose: 88 mg/dl (ref 70–140)
POTASSIUM: 3.5 meq/L (ref 3.5–5.1)
SODIUM: 141 meq/L (ref 136–145)
Total Bilirubin: 0.54 mg/dL (ref 0.20–1.20)
Total Protein: 6.9 g/dL (ref 6.4–8.3)

## 2016-11-20 MED ORDER — SODIUM CHLORIDE 0.9 % IV SOLN
400.0000 mg/m2 | Freq: Once | INTRAVENOUS | Status: AC
  Administered 2016-11-20: 675 mg via INTRAVENOUS
  Filled 2016-11-20: qty 20

## 2016-11-20 MED ORDER — SODIUM CHLORIDE 0.9 % IV SOLN
Freq: Once | INTRAVENOUS | Status: AC
  Administered 2016-11-20: 10:00:00 via INTRAVENOUS
  Filled 2016-11-20: qty 8

## 2016-11-20 MED ORDER — SODIUM CHLORIDE 0.9 % IV SOLN
Freq: Once | INTRAVENOUS | Status: AC
  Administered 2016-11-20: 10:00:00 via INTRAVENOUS

## 2016-11-20 NOTE — Patient Instructions (Signed)
[facility] Discharge Instructions for Patients Receiving Chemotherapy  Today you received the following chemotherapy agents Alimta.  To help prevent nausea and vomiting after your treatment, we encourage you to take your nausea medication as prescribed.   If you develop nausea and vomiting that is not controlled by your nausea medication, call the clinic.   BELOW ARE SYMPTOMS THAT SHOULD BE REPORTED IMMEDIATELY:  *FEVER GREATER THAN 100.5 F  *CHILLS WITH OR WITHOUT FEVER  NAUSEA AND VOMITING THAT IS NOT CONTROLLED WITH YOUR NAUSEA MEDICATION  *UNUSUAL SHORTNESS OF BREATH  *UNUSUAL BRUISING OR BLEEDING  TENDERNESS IN MOUTH AND THROAT WITH OR WITHOUT PRESENCE OF ULCERS  *URINARY PROBLEMS  *BOWEL PROBLEMS  UNUSUAL RASH Items with * indicate a potential emergency and should be followed up as soon as possible.  Feel free to call the clinic you have any questions or concerns. The clinic phone number is [phone].  Please show the CHEMO ALERT CARD at check-in to the Emergency Department and triage nurse.   

## 2016-11-20 NOTE — Progress Notes (Signed)
[place] [phone]   [phone]  OFFICE PROGRESS NOTE  [name], MD [place] [address]  DIAGNOSIS: Recurrent non-small cell lung cancer, adenocarcinoma initially diagnosed as stage IB (T2, N0, M0) in June 2008 with tumor size of 8.0 cm.  PRIOR THERAPY: #1 status post left upper lobectomy with lymph node dissection under the care of Dr. Arlyce Dice on 06/29/2006.  #2 status post 4 cycles of adjuvant chemotherapy with cisplatin and Taxotere last dose given 08/01/2006.  #3 status post 6 cycles of systemic chemotherapy with carboplatin and Alimta for disease recurrence last dose given 12/12/2009.  #4 palliative radiotherapy to the enlarging right upper lobe lung mass under the care of Dr. Pablo Ledger completed on 11/14/2015.  CURRENT THERAPY: Maintenance systemic chemotherapy with single agent Alimta 400 MG/M2 every 3 weeks, status post 100 cycles.  INTERVAL HISTORY: [name] 83 y.o. female returns to the clinic today for follow-up visit. The patient is feeling fine today with no specific complaints. She denied having any chest pain but continues to have dry cough with no shortness of breath or hemoptysis. She denied having any fever or chills. She has no nausea, vomiting, diarrhea or constipation. She denied having any significant weight loss or night sweats. The patient is here today for evaluation before starting cycle 101 of her maintenance treatment.   MEDICAL HISTORY: Past Medical History:  Diagnosis Date  . FHx: chemotherapy 2008&2011   4 cycles cisplatin,taxotere,2008& carboplatin and Alimta 2011  . History of migraine headaches   . Hypercholesterolemia   . lung ca dx'd 06/2006   rt and lt lung  . Lung cancer ([place]) 3/14/1   right- Adenocarcinoma w/bronchioalveolar features  . Radiation 11/01/15-11/14/15   right upper lobe 30 gray  . Radiation pneumonitis ([place]) 01/24/2016    ALLERGIES:  is allergic to  simvastatin.  MEDICATIONS:  Current Outpatient Prescriptions  Medication Sig Dispense Refill  . albuterol (PROVENTIL HFA;VENTOLIN HFA) 108 (90 Base) MCG/ACT inhaler Inhale 1-2 puffs into the lungs every 6 (six) hours as needed for wheezing or shortness of breath. 1 Inhaler 2  . Calcium Carbonate-Vit D-Min (CALTRATE PLUS PO) Take 500 mg by mouth 2 (two) times daily. Reported on 12/13/2015    . chlorpheniramine-HYDROcodone (TUSSIONEX) 10-8 MG/5ML SUER Take 5 mLs by mouth 2 (two) times daily as needed for cough. 295 mL 0  . folic acid (FOLVITE) 1 MG tablet Take 1 mg by mouth daily. Reported on 12/13/2015    . Multiple Vitamins-Minerals (CENTRUM SILVER PO) Take 1 tablet by mouth daily. Reported on 12/13/2015    . prochlorperazine (COMPAZINE) 10 MG tablet Take 1 tablet (10 mg total) by mouth every 6 (six) hours as needed. 15 tablet 1  . vitamin B-12 (CYANOCOBALAMIN) 500 MCG tablet Take 1,500 mcg by mouth daily. Reported on 12/13/2015     No current facility-administered medications for this visit.     SURGICAL HISTORY:  Past Surgical History:  Procedure Laterality Date  . CATARACT EXTRACTION  2013   bilateral  . left  lowerlung lobectomy Left 06/19/2006   Dr.Burney,Left lower lobectomy  . Porta-cath  2011    REVIEW OF SYSTEMS:  A comprehensive review of systems was negative except for: Respiratory: positive for cough   PHYSICAL EXAMINATION: General appearance: alert, cooperative and no distress Head: Normocephalic, without obvious abnormality, atraumatic Neck: no adenopathy, no JVD, supple, symmetrical, trachea midline and thyroid not enlarged, symmetric, no tenderness/mass/nodules Lymph nodes: Cervical, supraclavicular, and  axillary nodes normal. Resp: rales LUL and wheezes bilaterally Back: symmetric, no curvature. ROM normal. No CVA tenderness. Cardio: regular rate and rhythm, S1, S2 normal, no murmur, click, rub or gallop and normal apical impulse GI: soft, non-tender; bowel sounds  normal; no masses,  no organomegaly Extremities: extremities normal, atraumatic, no cyanosis or edema  ECOG PERFORMANCE STATUS: 1 - Symptomatic but completely ambulatory  Blood pressure (!) 145/61, pulse 70, temperature 97.4 F (36.3 C), temperature source Oral, resp. rate 18, height 5\' 2"  (1.575 m), weight 125 lb 3.2 oz (56.8 kg), SpO2 100 %.  LABORATORY DATA: Lab Results  Component Value Date   WBC 3.1 (L) 11/20/2016   HGB 11.7 11/20/2016   HCT 35.3 11/20/2016   MCV 101.0 11/20/2016   PLT 233 11/20/2016      Chemistry      Component Value Date/Time   NA 141 11/20/2016 0816   K 3.5 11/20/2016 0816   CL 108 (H) 11/04/2012 0947   CO2 23 11/20/2016 0816   BUN 12.8 11/20/2016 0816   CREATININE 0.8 11/20/2016 0816      Component Value Date/Time   CALCIUM 9.2 11/20/2016 0816   ALKPHOS 71 11/20/2016 0816   AST 21 11/20/2016 0816   ALT 10 11/20/2016 0816   BILITOT 0.54 11/20/2016 0816       RADIOGRAPHIC STUDIES: Ct Chest Wo Contrast  Result Date: 10/29/2016 CLINICAL DATA:  Left lower lobectomy, radiation therapy, and chemotherapy. Bilateral lung cancer. EXAM: CT CHEST WITHOUT CONTRAST TECHNIQUE: Multidetector CT imaging of the chest was performed following the standard protocol without IV contrast. COMPARISON:  08/15/2016 FINDINGS: Cardiovascular: A right-sided Port-A-Cath which terminates at the low SVC or caval/atrial junction. Normal heart size, without pericardial effusion. Multivessel coronary artery atherosclerosis. Mediastinum/Nodes: No supraclavicular adenopathy. No mediastinal or definite hilar adenopathy, given limitations of unenhanced CT. Lungs/Pleura: Trace bilateral pleural thickening. Mild centrilobular emphysema. Status post left lower lobectomy. Right upper and minimal superior segment right lower lobe radiation induced consolidation again identified. No well-defined residual or recurrent disease. Areas of mild nodularity along the periphery of the presumed  radiation induced consolidation posteriorly are felt to be similar, including on images 40 and 45/series 7. A 4 mm left upper lobe pulmonary nodule is unchanged on image 48/ series 7. Lingular soft tissue density measures 1.7 x 2.5 cm on image 78/series 7. Compare 2.0 x 2.9 cm on the prior. Upper Abdomen: Normal imaged portions of the liver, spleen, stomach, adrenal glands, kidneys. Musculoskeletal: No acute osseous abnormality. IMPRESSION: 1. Similar appearance of presumed radiation fibrosis in the right upper and less so right lower lobes. 2. Status post left lower lobectomy. Decrease in an lingular area of nodular consolidation. This could represent improving neoplasia or infection. 3.  Coronary artery atherosclerosis. Aortic atherosclerosis. Electronically Signed   By: Abigail Miyamoto M.D.   On: 10/29/2016 14:27    ASSESSMENT AND PLAN:  This is a very pleasant 83 years old white female with recurrent non-small cell lung cancer, adenocarcinoma status post induction systemic chemotherapy with carboplatin and Alimta with partial response and she is currently on maintenance treatment with single agent Alimta status post 100 cycles and she has been tolerating her treatment well. I recommended for the patient to proceed with cycle 101 today. She will come back for follow-up visit in 3 weeks for evaluation before the next dose of her treatment. The patient was advised to call immediately if she has any concerning symptoms in the interval. The patient voices understanding of current  disease status and treatment options and is in agreement with the current care plan. All questions were answered. The patient knows to call the clinic with any problems, questions or concerns. We can certainly see the patient much sooner if necessary. I spent 10 minutes counseling the patient face to face. The total time spent in the appointment was 15 minutes.  Disclaimer: This note was dictated with voice recognition software.  Similar sounding words can inadvertently be transcribed and may not be corrected upon review.

## 2016-11-20 NOTE — Telephone Encounter (Signed)
Scheduled appt per 6/20 los - Gave patient AVS and calender per LOS.

## 2016-12-11 ENCOUNTER — Ambulatory Visit (HOSPITAL_BASED_OUTPATIENT_CLINIC_OR_DEPARTMENT_OTHER): Payer: Medicare Other

## 2016-12-11 ENCOUNTER — Ambulatory Visit (HOSPITAL_BASED_OUTPATIENT_CLINIC_OR_DEPARTMENT_OTHER): Payer: Medicare Other | Admitting: Internal Medicine

## 2016-12-11 ENCOUNTER — Encounter: Payer: Self-pay | Admitting: Internal Medicine

## 2016-12-11 ENCOUNTER — Other Ambulatory Visit (HOSPITAL_BASED_OUTPATIENT_CLINIC_OR_DEPARTMENT_OTHER): Payer: Medicare Other

## 2016-12-11 VITALS — BP 130/72 | HR 78 | Temp 98.3°F | Resp 18 | Ht 62.0 in | Wt 126.9 lb

## 2016-12-11 DIAGNOSIS — C3411 Malignant neoplasm of upper lobe, right bronchus or lung: Secondary | ICD-10-CM | POA: Diagnosis not present

## 2016-12-11 DIAGNOSIS — Z5111 Encounter for antineoplastic chemotherapy: Secondary | ICD-10-CM

## 2016-12-11 DIAGNOSIS — C3491 Malignant neoplasm of unspecified part of right bronchus or lung: Secondary | ICD-10-CM

## 2016-12-11 DIAGNOSIS — C3492 Malignant neoplasm of unspecified part of left bronchus or lung: Principal | ICD-10-CM

## 2016-12-11 LAB — COMPREHENSIVE METABOLIC PANEL
ALBUMIN: 3.2 g/dL — AB (ref 3.5–5.0)
ALK PHOS: 70 U/L (ref 40–150)
ALT: 7 U/L (ref 0–55)
AST: 18 U/L (ref 5–34)
Anion Gap: 7 mEq/L (ref 3–11)
BUN: 13.7 mg/dL (ref 7.0–26.0)
CALCIUM: 9.2 mg/dL (ref 8.4–10.4)
CO2: 26 mEq/L (ref 22–29)
Chloride: 108 mEq/L (ref 98–109)
Creatinine: 0.8 mg/dL (ref 0.6–1.1)
EGFR: 68 mL/min/{1.73_m2} — AB (ref >90)
Glucose: 103 mg/dl (ref 70–140)
POTASSIUM: 4 meq/L (ref 3.5–5.1)
Sodium: 141 mEq/L (ref 136–145)
Total Bilirubin: 0.43 mg/dL (ref 0.20–1.20)
Total Protein: 6.7 g/dL (ref 6.4–8.3)

## 2016-12-11 LAB — CBC WITH DIFFERENTIAL/PLATELET
BASO%: 0.4 % (ref 0.0–2.0)
BASOS ABS: 0 10*3/uL (ref 0.0–0.1)
EOS ABS: 0 10*3/uL (ref 0.0–0.5)
EOS%: 0.9 % (ref 0.0–7.0)
HEMATOCRIT: 35.6 % (ref 34.8–46.6)
HGB: 11.7 g/dL (ref 11.6–15.9)
LYMPH#: 1.3 10*3/uL (ref 0.9–3.3)
LYMPH%: 36.7 % (ref 14.0–49.7)
MCH: 33.2 pg (ref 25.1–34.0)
MCHC: 32.9 g/dL (ref 31.5–36.0)
MCV: 100.8 fL (ref 79.5–101.0)
MONO#: 0.4 10*3/uL (ref 0.1–0.9)
MONO%: 10.5 % (ref 0.0–14.0)
NEUT#: 1.8 10*3/uL (ref 1.5–6.5)
NEUT%: 51.5 % (ref 38.4–76.8)
Platelets: 238 10*3/uL (ref 145–400)
RBC: 3.53 10*6/uL — ABNORMAL LOW (ref 3.70–5.45)
RDW: 14.7 % — AB (ref 11.2–14.5)
WBC: 3.5 10*3/uL — ABNORMAL LOW (ref 3.9–10.3)

## 2016-12-11 MED ORDER — DEXAMETHASONE SODIUM PHOSPHATE 100 MG/10ML IJ SOLN
Freq: Once | INTRAMUSCULAR | Status: AC
  Administered 2016-12-11: 14:00:00 via INTRAVENOUS
  Filled 2016-12-11: qty 8

## 2016-12-11 MED ORDER — SODIUM CHLORIDE 0.9 % IV SOLN
400.0000 mg/m2 | Freq: Once | INTRAVENOUS | Status: AC
  Administered 2016-12-11: 675 mg via INTRAVENOUS
  Filled 2016-12-11: qty 20

## 2016-12-11 MED ORDER — CYANOCOBALAMIN 1000 MCG/ML IJ SOLN
1000.0000 ug | Freq: Once | INTRAMUSCULAR | Status: AC
  Administered 2016-12-11: 1000 ug via INTRAMUSCULAR

## 2016-12-11 MED ORDER — SODIUM CHLORIDE 0.9 % IJ SOLN
10.0000 mL | INTRAMUSCULAR | Status: DC | PRN
  Administered 2016-12-11: 10 mL
  Filled 2016-12-11: qty 10

## 2016-12-11 MED ORDER — SODIUM CHLORIDE 0.9 % IV SOLN
Freq: Once | INTRAVENOUS | Status: AC
  Administered 2016-12-11: 14:00:00 via INTRAVENOUS

## 2016-12-11 MED ORDER — CYANOCOBALAMIN 1000 MCG/ML IJ SOLN
INTRAMUSCULAR | Status: AC
  Filled 2016-12-11: qty 1

## 2016-12-11 MED ORDER — HEPARIN SOD (PORK) LOCK FLUSH 100 UNIT/ML IV SOLN
500.0000 [IU] | Freq: Once | INTRAVENOUS | Status: AC | PRN
  Administered 2016-12-11: 500 [IU]
  Filled 2016-12-11: qty 5

## 2016-12-11 NOTE — Patient Instructions (Signed)
[facility] Discharge Instructions for Patients Receiving Chemotherapy  Today you received the following chemotherapy agents Alimta.  To help prevent nausea and vomiting after your treatment, we encourage you to take your nausea medication as prescribed.   If you develop nausea and vomiting that is not controlled by your nausea medication, call the clinic.   BELOW ARE SYMPTOMS THAT SHOULD BE REPORTED IMMEDIATELY:  *FEVER GREATER THAN 100.5 F  *CHILLS WITH OR WITHOUT FEVER  NAUSEA AND VOMITING THAT IS NOT CONTROLLED WITH YOUR NAUSEA MEDICATION  *UNUSUAL SHORTNESS OF BREATH  *UNUSUAL BRUISING OR BLEEDING  TENDERNESS IN MOUTH AND THROAT WITH OR WITHOUT PRESENCE OF ULCERS  *URINARY PROBLEMS  *BOWEL PROBLEMS  UNUSUAL RASH Items with * indicate a potential emergency and should be followed up as soon as possible.  Feel free to call the clinic you have any questions or concerns. The clinic phone number is [phone].  Please show the CHEMO ALERT CARD at check-in to the Emergency Department and triage nurse.   

## 2016-12-11 NOTE — Progress Notes (Signed)
[place] [phone]   Fax:(336) [phone]  OFFICE PROGRESS NOTE  [name], MD [place] [address]  DIAGNOSIS: Recurrent non-small cell lung cancer, adenocarcinoma initially diagnosed as stage IB (T2, N0, M0) in June 2008 with tumor size of 8.0 cm.  PRIOR THERAPY: #1 status post left upper lobectomy with lymph node dissection under the care of Dr. Arlyce Dice on 06/29/2006.  #2 status post 4 cycles of adjuvant chemotherapy with cisplatin and Taxotere last dose given 08/01/2006.  #3 status post 6 cycles of systemic chemotherapy with carboplatin and Alimta for disease recurrence last dose given 12/12/2009.  #4 palliative radiotherapy to the enlarging right upper lobe lung mass under the care of Dr. Pablo Ledger completed on 11/14/2015.  CURRENT THERAPY: Maintenance systemic chemotherapy with single agent Alimta 400 MG/M2 every 3 weeks, status post 101 cycles.  INTERVAL HISTORY: [name] 83 y.o. female returns to the clinic today for follow-up visit. The patient is feeling fine today with no specific complaints except for dry cough and mild fatigue. She continues to tolerate her treatment with maintenance Alimta fairly well. She denied having any nausea or vomiting. She denied having any fever or chills. She has no chest pain, shortness of breath or hemoptysis. She is here today for evaluation before starting cycle #102.   MEDICAL HISTORY: Past Medical History:  Diagnosis Date  . FHx: chemotherapy 2008&2011   4 cycles cisplatin,taxotere,2008& carboplatin and Alimta 2011  . History of migraine headaches   . Hypercholesterolemia   . lung ca dx'd 06/2006   rt and lt lung  . Lung cancer ([place]) 3/14/1   right- Adenocarcinoma w/bronchioalveolar features  . Radiation 11/01/15-11/14/15   right upper lobe 30 gray  . Radiation pneumonitis ([place]) 01/24/2016    ALLERGIES:  is allergic to simvastatin.  MEDICATIONS:  Current Outpatient  Prescriptions  Medication Sig Dispense Refill  . albuterol (PROVENTIL HFA;VENTOLIN HFA) 108 (90 Base) MCG/ACT inhaler Inhale 1-2 puffs into the lungs every 6 (six) hours as needed for wheezing or shortness of breath. 1 Inhaler 2  . Calcium Carbonate-Vit D-Min (CALTRATE PLUS PO) Take 500 mg by mouth 2 (two) times daily. Reported on 12/13/2015    . chlorpheniramine-HYDROcodone (TUSSIONEX) 10-8 MG/5ML SUER Take 5 mLs by mouth 2 (two) times daily as needed for cough. 026 mL 0  . folic acid (FOLVITE) 1 MG tablet Take 1 mg by mouth daily. Reported on 12/13/2015    . Multiple Vitamins-Minerals (CENTRUM SILVER PO) Take 1 tablet by mouth daily. Reported on 12/13/2015    . prochlorperazine (COMPAZINE) 10 MG tablet Take 1 tablet (10 mg total) by mouth every 6 (six) hours as needed. 15 tablet 1  . vitamin B-12 (CYANOCOBALAMIN) 500 MCG tablet Take 1,500 mcg by mouth daily. Reported on 12/13/2015     No current facility-administered medications for this visit.     SURGICAL HISTORY:  Past Surgical History:  Procedure Laterality Date  . CATARACT EXTRACTION  2013   bilateral  . left  lowerlung lobectomy Left 06/19/2006   Dr.Burney,Left lower lobectomy  . Porta-cath  2011    REVIEW OF SYSTEMS:  A comprehensive review of systems was negative except for: Constitutional: positive for fatigue Respiratory: positive for cough   PHYSICAL EXAMINATION: General appearance: alert, cooperative and no distress Head: Normocephalic, without obvious abnormality, atraumatic Neck: no adenopathy, no JVD, supple, symmetrical, trachea midline and thyroid not enlarged, symmetric, no tenderness/mass/nodules Lymph nodes: Cervical, supraclavicular, and axillary nodes normal.  Resp: rales LUL and wheezes bilaterally Back: symmetric, no curvature. ROM normal. No CVA tenderness. Cardio: regular rate and rhythm, S1, S2 normal, no murmur, click, rub or gallop and normal apical impulse GI: soft, non-tender; bowel sounds normal; no  masses,  no organomegaly Extremities: extremities normal, atraumatic, no cyanosis or edema  ECOG PERFORMANCE STATUS: 1 - Symptomatic but completely ambulatory  Blood pressure 130/72, pulse 78, temperature 98.3 F (36.8 C), temperature source Oral, resp. rate 18, height 5\' 2"  (1.575 m), weight 126 lb 14.4 oz (57.6 kg), SpO2 100 %.  LABORATORY DATA: Lab Results  Component Value Date   WBC 3.5 (L) 12/11/2016   HGB 11.7 12/11/2016   HCT 35.6 12/11/2016   MCV 100.8 12/11/2016   PLT 238 12/11/2016      Chemistry      Component Value Date/Time   NA 141 11/20/2016 0816   K 3.5 11/20/2016 0816   CL 108 (H) 11/04/2012 0947   CO2 23 11/20/2016 0816   BUN 12.8 11/20/2016 0816   CREATININE 0.8 11/20/2016 0816      Component Value Date/Time   CALCIUM 9.2 11/20/2016 0816   ALKPHOS 71 11/20/2016 0816   AST 21 11/20/2016 0816   ALT 10 11/20/2016 0816   BILITOT 0.54 11/20/2016 0816       RADIOGRAPHIC STUDIES: No results found.  ASSESSMENT AND PLAN:  This is a very pleasant 83 years old white female with recurrent non-small cell lung cancer, adenocarcinoma status post induction systemic chemotherapy was carboplatin and Alimta with partial response. She is currently on maintenance treatment with single agent Alimta status post 101 cycles. She continues to tolerate her treatment well with no significant adverse effects. I recommended for the patient to proceed with cycle 102 today as scheduled. I will see her back for follow-up visit in 3 weeks for evaluation before the next cycle of her treatment. She was advised to call immediately if she has any concerning symptoms in the interval. The patient voices understanding of current disease status and treatment options and is in agreement with the current care plan. All questions were answered. The patient knows to call the clinic with any problems, questions or concerns. We can certainly see the patient much sooner if necessary. I spent 10  minutes counseling the patient face to face. The total time spent in the appointment was 15 minutes.  Disclaimer: This note was dictated with voice recognition software. Similar sounding words can inadvertently be transcribed and may not be corrected upon review.

## 2017-01-01 ENCOUNTER — Other Ambulatory Visit: Payer: Self-pay | Admitting: Medical Oncology

## 2017-01-01 ENCOUNTER — Other Ambulatory Visit (HOSPITAL_BASED_OUTPATIENT_CLINIC_OR_DEPARTMENT_OTHER): Payer: Medicare Other

## 2017-01-01 ENCOUNTER — Ambulatory Visit (HOSPITAL_BASED_OUTPATIENT_CLINIC_OR_DEPARTMENT_OTHER): Payer: Medicare Other

## 2017-01-01 ENCOUNTER — Ambulatory Visit (HOSPITAL_BASED_OUTPATIENT_CLINIC_OR_DEPARTMENT_OTHER): Payer: Medicare Other | Admitting: Internal Medicine

## 2017-01-01 ENCOUNTER — Encounter: Payer: Self-pay | Admitting: Internal Medicine

## 2017-01-01 VITALS — BP 111/56 | HR 73 | Temp 98.6°F | Resp 20 | Ht 62.0 in | Wt 127.3 lb

## 2017-01-01 DIAGNOSIS — Z85118 Personal history of other malignant neoplasm of bronchus and lung: Secondary | ICD-10-CM

## 2017-01-01 DIAGNOSIS — C3492 Malignant neoplasm of unspecified part of left bronchus or lung: Principal | ICD-10-CM

## 2017-01-01 DIAGNOSIS — R05 Cough: Secondary | ICD-10-CM

## 2017-01-01 DIAGNOSIS — C3491 Malignant neoplasm of unspecified part of right bronchus or lung: Secondary | ICD-10-CM

## 2017-01-01 DIAGNOSIS — Z5111 Encounter for antineoplastic chemotherapy: Secondary | ICD-10-CM

## 2017-01-01 DIAGNOSIS — J40 Bronchitis, not specified as acute or chronic: Secondary | ICD-10-CM

## 2017-01-01 DIAGNOSIS — J700 Acute pulmonary manifestations due to radiation: Secondary | ICD-10-CM

## 2017-01-01 DIAGNOSIS — C3411 Malignant neoplasm of upper lobe, right bronchus or lung: Secondary | ICD-10-CM | POA: Diagnosis not present

## 2017-01-01 DIAGNOSIS — R059 Cough, unspecified: Secondary | ICD-10-CM

## 2017-01-01 LAB — COMPREHENSIVE METABOLIC PANEL
ALT: 7 U/L (ref 0–55)
ANION GAP: 6 meq/L (ref 3–11)
AST: 19 U/L (ref 5–34)
Albumin: 3.2 g/dL — ABNORMAL LOW (ref 3.5–5.0)
Alkaline Phosphatase: 78 U/L (ref 40–150)
BUN: 18.3 mg/dL (ref 7.0–26.0)
CALCIUM: 8.9 mg/dL (ref 8.4–10.4)
CHLORIDE: 107 meq/L (ref 98–109)
CO2: 27 meq/L (ref 22–29)
Creatinine: 0.8 mg/dL (ref 0.6–1.1)
EGFR: 67 mL/min/{1.73_m2} — AB (ref >90)
Glucose: 105 mg/dl (ref 70–140)
POTASSIUM: 4 meq/L (ref 3.5–5.1)
Sodium: 140 mEq/L (ref 136–145)
Total Bilirubin: 0.47 mg/dL (ref 0.20–1.20)
Total Protein: 6.8 g/dL (ref 6.4–8.3)

## 2017-01-01 LAB — CBC WITH DIFFERENTIAL/PLATELET
BASO%: 0.4 % (ref 0.0–2.0)
BASOS ABS: 0 10*3/uL (ref 0.0–0.1)
EOS%: 1 % (ref 0.0–7.0)
Eosinophils Absolute: 0 10*3/uL (ref 0.0–0.5)
HEMATOCRIT: 36.1 % (ref 34.8–46.6)
HGB: 11.9 g/dL (ref 11.6–15.9)
LYMPH%: 33.6 % (ref 14.0–49.7)
MCH: 33.1 pg (ref 25.1–34.0)
MCHC: 33 g/dL (ref 31.5–36.0)
MCV: 100.1 fL (ref 79.5–101.0)
MONO#: 0.4 10*3/uL (ref 0.1–0.9)
MONO%: 11.9 % (ref 0.0–14.0)
NEUT#: 1.9 10*3/uL (ref 1.5–6.5)
NEUT%: 53.1 % (ref 38.4–76.8)
PLATELETS: 247 10*3/uL (ref 145–400)
RBC: 3.61 10*6/uL — AB (ref 3.70–5.45)
RDW: 14.1 % (ref 11.2–14.5)
WBC: 3.5 10*3/uL — ABNORMAL LOW (ref 3.9–10.3)
lymph#: 1.2 10*3/uL (ref 0.9–3.3)

## 2017-01-01 MED ORDER — HYDROCOD POLST-CPM POLST ER 10-8 MG/5ML PO SUER: 5.0000 mL | Freq: Two times a day (BID) | ORAL | 0 refills | Status: DC | PRN

## 2017-01-01 MED ORDER — HEPARIN SOD (PORK) LOCK FLUSH 100 UNIT/ML IV SOLN
500.0000 [IU] | Freq: Once | INTRAVENOUS | Status: AC | PRN
  Administered 2017-01-01: 500 [IU]
  Filled 2017-01-01: qty 5

## 2017-01-01 MED ORDER — SODIUM CHLORIDE 0.9 % IV SOLN
Freq: Once | INTRAVENOUS | Status: AC
  Administered 2017-01-01: 13:00:00 via INTRAVENOUS

## 2017-01-01 MED ORDER — SODIUM CHLORIDE 0.9 % IJ SOLN
10.0000 mL | INTRAMUSCULAR | Status: DC | PRN
  Administered 2017-01-01: 10 mL
  Filled 2017-01-01: qty 10

## 2017-01-01 MED ORDER — ALBUTEROL SULFATE HFA 108 (90 BASE) MCG/ACT IN AERS: 1.0000 | INHALATION_SPRAY | Freq: Four times a day (QID) | RESPIRATORY_TRACT | 2 refills | Status: DC | PRN

## 2017-01-01 MED ORDER — SODIUM CHLORIDE 0.9 % IV SOLN
400.0000 mg/m2 | Freq: Once | INTRAVENOUS | Status: AC
  Administered 2017-01-01: 675 mg via INTRAVENOUS
  Filled 2017-01-01: qty 20

## 2017-01-01 MED ORDER — SODIUM CHLORIDE 0.9 % IV SOLN
Freq: Once | INTRAVENOUS | Status: AC
  Administered 2017-01-01: 13:00:00 via INTRAVENOUS
  Filled 2017-01-01: qty 8

## 2017-01-01 NOTE — Patient Instructions (Signed)
[facility] Discharge Instructions for Patients Receiving Chemotherapy  Today you received the following chemotherapy agents; Alimta.   To help prevent nausea and vomiting after your treatment, we encourage you to take your nausea medication as directed.    If you develop nausea and vomiting that is not controlled by your nausea medication, call the clinic.   BELOW ARE SYMPTOMS THAT SHOULD BE REPORTED IMMEDIATELY:  *FEVER GREATER THAN 100.5 F  *CHILLS WITH OR WITHOUT FEVER  NAUSEA AND VOMITING THAT IS NOT CONTROLLED WITH YOUR NAUSEA MEDICATION  *UNUSUAL SHORTNESS OF BREATH  *UNUSUAL BRUISING OR BLEEDING  TENDERNESS IN MOUTH AND THROAT WITH OR WITHOUT PRESENCE OF ULCERS  *URINARY PROBLEMS  *BOWEL PROBLEMS  UNUSUAL RASH Items with * indicate a potential emergency and should be followed up as soon as possible.  Feel free to call the clinic you have any questions or concerns. The clinic phone number is [phone].  Please show the CHEMO ALERT CARD at check-in to the Emergency Department and triage nurse.   

## 2017-01-01 NOTE — Progress Notes (Signed)
[place] [phone]   [phone]  OFFICE PROGRESS NOTE  [name], MD [place] [address]  DIAGNOSIS: Recurrent non-small cell lung cancer, adenocarcinoma initially diagnosed as stage IB (T2, N0, M0) in June 2008 with tumor size of 8.0 cm.  PRIOR THERAPY: #1 status post left upper lobectomy with lymph node dissection under the care of Dr. Arlyce Dice on 06/29/2006.  #2 status post 4 cycles of adjuvant chemotherapy with cisplatin and Taxotere last dose given 08/01/2006.  #3 status post 6 cycles of systemic chemotherapy with carboplatin and Alimta for disease recurrence last dose given 12/12/2009.  #4 palliative radiotherapy to the enlarging right upper lobe lung mass under the care of Dr. Pablo Ledger completed on 11/14/2015.  CURRENT THERAPY: Maintenance systemic chemotherapy with single agent Alimta 400 MG/M2 every 3 weeks, status post 102 cycles.  INTERVAL HISTORY: [name] 83 y.o. female returns to the clinic today for follow-up visit. The patient has no complaints today except for the dry cough. She uses Tussionex but mainly at nighttime. She denied having any chest pain, shortness of breath or hemoptysis. She has no weight loss or night sweats. She denied having any nausea, vomiting, diarrhea or constipation. She is tolerating her treatment with maintenance Alimta fairly well. She is here today for evaluation before starting cycle #103.   MEDICAL HISTORY: Past Medical History:  Diagnosis Date  . FHx: chemotherapy 2008&2011   4 cycles cisplatin,taxotere,2008& carboplatin and Alimta 2011  . History of migraine headaches   . Hypercholesterolemia   . lung ca dx'd 06/2006   rt and lt lung  . Lung cancer ([place]) 3/14/1   right- Adenocarcinoma w/bronchioalveolar features  . Radiation 11/01/15-11/14/15   right upper lobe 30 gray  . Radiation pneumonitis ([place]) 01/24/2016    ALLERGIES:  is allergic to  simvastatin.  MEDICATIONS:  Current Outpatient Prescriptions  Medication Sig Dispense Refill  . albuterol (PROVENTIL HFA;VENTOLIN HFA) 108 (90 Base) MCG/ACT inhaler Inhale 1-2 puffs into the lungs every 6 (six) hours as needed for wheezing or shortness of breath. 1 Inhaler 2  . Calcium Carbonate-Vit D-Min (CALTRATE PLUS PO) Take 500 mg by mouth 2 (two) times daily. Reported on 12/13/2015    . chlorpheniramine-HYDROcodone (TUSSIONEX) 10-8 MG/5ML SUER Take 5 mLs by mouth 2 (two) times daily as needed for cough. 791 mL 0  . folic acid (FOLVITE) 1 MG tablet Take 1 mg by mouth daily. Reported on 12/13/2015    . Multiple Vitamins-Minerals (CENTRUM SILVER PO) Take 1 tablet by mouth daily. Reported on 12/13/2015    . prochlorperazine (COMPAZINE) 10 MG tablet Take 1 tablet (10 mg total) by mouth every 6 (six) hours as needed. 15 tablet 1  . vitamin B-12 (CYANOCOBALAMIN) 500 MCG tablet Take 1,500 mcg by mouth daily. Reported on 12/13/2015     No current facility-administered medications for this visit.     SURGICAL HISTORY:  Past Surgical History:  Procedure Laterality Date  . CATARACT EXTRACTION  2013   bilateral  . left  lowerlung lobectomy Left 06/19/2006   Dr.Burney,Left lower lobectomy  . Porta-cath  2011    REVIEW OF SYSTEMS:  A comprehensive review of systems was negative except for: Constitutional: positive for fatigue Respiratory: positive for cough   PHYSICAL EXAMINATION: General appearance: alert, cooperative, fatigued and no distress Head: Normocephalic, without obvious abnormality, atraumatic Neck: no adenopathy, no JVD, supple, symmetrical, trachea midline and thyroid not enlarged, symmetric, no tenderness/mass/nodules Lymph nodes: Cervical,  supraclavicular, and axillary nodes normal. Resp: rales LUL and wheezes bilaterally Back: symmetric, no curvature. ROM normal. No CVA tenderness. Cardio: regular rate and rhythm, S1, S2 normal, no murmur, click, rub or gallop and normal apical  impulse GI: soft, non-tender; bowel sounds normal; no masses,  no organomegaly Extremities: extremities normal, atraumatic, no cyanosis or edema  ECOG PERFORMANCE STATUS: 1 - Symptomatic but completely ambulatory  Blood pressure (!) 111/56, pulse 73, temperature 98.6 F (37 C), temperature source Oral, resp. rate 20, height 5\' 2"  (1.575 m), weight 127 lb 4.8 oz (57.7 kg), SpO2 100 %.  LABORATORY DATA: Lab Results  Component Value Date   WBC 3.5 (L) 01/01/2017   HGB 11.9 01/01/2017   HCT 36.1 01/01/2017   MCV 100.1 01/01/2017   PLT 247 01/01/2017      Chemistry      Component Value Date/Time   NA 141 12/11/2016 1207   K 4.0 12/11/2016 1207   CL 108 (H) 11/04/2012 0947   CO2 26 12/11/2016 1207   BUN 13.7 12/11/2016 1207   CREATININE 0.8 12/11/2016 1207      Component Value Date/Time   CALCIUM 9.2 12/11/2016 1207   ALKPHOS 70 12/11/2016 1207   AST 18 12/11/2016 1207   ALT 7 12/11/2016 1207   BILITOT 0.43 12/11/2016 1207       RADIOGRAPHIC STUDIES: No results found.  ASSESSMENT AND PLAN:  This is a very pleasant 83 years old white female with recurrent non-small cell lung cancer, adenocarcinoma status post induction systemic chemotherapy was carboplatin and Alimta with partial response. She is currently on maintenance treatment with single agent Alimta status post 102 cycles. The patient is doing fine today with no specific complaints except for the dry cough from the previous radiation induced pneumonitis. I recommended for her to proceed with cycle #103 today as scheduled. I will see her back for follow-up visit in 3 weeks for evaluation after repeating CT scan of the chest without contrast for restaging of her disease. For the dry cough, I gave the patient refill for Tussionex and I advised her to use it for daily. She was also advised to call immediately if she has any concerning symptoms in the interval. The patient voices understanding of current disease status and  treatment options and is in agreement with the current care plan. All questions were answered. The patient knows to call the clinic with any problems, questions or concerns. We can certainly see the patient much sooner if necessary. I spent 10 minutes counseling the patient face to face. The total time spent in the appointment was 15 minutes.  Disclaimer: This note was dictated with voice recognition software. Similar sounding words can inadvertently be transcribed and may not be corrected upon review.

## 2017-01-02 ENCOUNTER — Telehealth: Payer: Self-pay | Admitting: Internal Medicine

## 2017-01-02 NOTE — Telephone Encounter (Signed)
Patient already had all appointments scheduled for the next two months.

## 2017-01-20 ENCOUNTER — Ambulatory Visit (HOSPITAL_COMMUNITY)
Admission: RE | Admit: 2017-01-20 | Discharge: 2017-01-20 | Disposition: A | Discharge status: Home / Self Care | Payer: Medicare Other | Source: Ambulatory Visit | Attending: Internal Medicine | Admitting: Internal Medicine

## 2017-01-20 ENCOUNTER — Encounter (HOSPITAL_COMMUNITY): Payer: Self-pay

## 2017-01-20 DIAGNOSIS — Z923 Personal history of irradiation: Secondary | ICD-10-CM | POA: Diagnosis not present

## 2017-01-20 DIAGNOSIS — I700 Atherosclerosis of aorta: Secondary | ICD-10-CM | POA: Insufficient documentation

## 2017-01-20 DIAGNOSIS — Z902 Acquired absence of lung [part of]: Secondary | ICD-10-CM | POA: Diagnosis not present

## 2017-01-20 DIAGNOSIS — C3492 Malignant neoplasm of unspecified part of left bronchus or lung: Secondary | ICD-10-CM | POA: Diagnosis not present

## 2017-01-20 DIAGNOSIS — R918 Other nonspecific abnormal finding of lung field: Secondary | ICD-10-CM | POA: Diagnosis not present

## 2017-01-20 DIAGNOSIS — Z5111 Encounter for antineoplastic chemotherapy: Secondary | ICD-10-CM

## 2017-01-20 DIAGNOSIS — C3491 Malignant neoplasm of unspecified part of right bronchus or lung: Secondary | ICD-10-CM | POA: Insufficient documentation

## 2017-01-22 ENCOUNTER — Other Ambulatory Visit (HOSPITAL_BASED_OUTPATIENT_CLINIC_OR_DEPARTMENT_OTHER): Payer: Medicare Other

## 2017-01-22 ENCOUNTER — Ambulatory Visit (HOSPITAL_BASED_OUTPATIENT_CLINIC_OR_DEPARTMENT_OTHER): Payer: Medicare Other

## 2017-01-22 ENCOUNTER — Other Ambulatory Visit: Payer: Self-pay | Admitting: Internal Medicine

## 2017-01-22 ENCOUNTER — Ambulatory Visit (HOSPITAL_BASED_OUTPATIENT_CLINIC_OR_DEPARTMENT_OTHER): Payer: Medicare Other | Admitting: Internal Medicine

## 2017-01-22 ENCOUNTER — Encounter: Payer: Self-pay | Admitting: Internal Medicine

## 2017-01-22 ENCOUNTER — Telehealth: Payer: Self-pay | Admitting: Internal Medicine

## 2017-01-22 VITALS — BP 129/62 | HR 83 | Temp 98.2°F | Resp 18 | Ht 62.0 in | Wt 128.0 lb

## 2017-01-22 DIAGNOSIS — J700 Acute pulmonary manifestations due to radiation: Secondary | ICD-10-CM

## 2017-01-22 DIAGNOSIS — Z5111 Encounter for antineoplastic chemotherapy: Secondary | ICD-10-CM

## 2017-01-22 DIAGNOSIS — C3411 Malignant neoplasm of upper lobe, right bronchus or lung: Secondary | ICD-10-CM | POA: Diagnosis not present

## 2017-01-22 DIAGNOSIS — C3491 Malignant neoplasm of unspecified part of right bronchus or lung: Secondary | ICD-10-CM

## 2017-01-22 DIAGNOSIS — R05 Cough: Secondary | ICD-10-CM

## 2017-01-22 DIAGNOSIS — C3492 Malignant neoplasm of unspecified part of left bronchus or lung: Principal | ICD-10-CM

## 2017-01-22 LAB — COMPREHENSIVE METABOLIC PANEL
ALT: 7 U/L (ref 0–55)
ANION GAP: 6 meq/L (ref 3–11)
AST: 20 U/L (ref 5–34)
Albumin: 3.2 g/dL — ABNORMAL LOW (ref 3.5–5.0)
Alkaline Phosphatase: 82 U/L (ref 40–150)
BILIRUBIN TOTAL: 0.48 mg/dL (ref 0.20–1.20)
BUN: 16.3 mg/dL (ref 7.0–26.0)
CO2: 26 meq/L (ref 22–29)
CREATININE: 0.8 mg/dL (ref 0.6–1.1)
Calcium: 9.1 mg/dL (ref 8.4–10.4)
Chloride: 108 mEq/L (ref 98–109)
EGFR: 65 mL/min/{1.73_m2} — ABNORMAL LOW (ref >90)
GLUCOSE: 95 mg/dL (ref 70–140)
Potassium: 4.4 mEq/L (ref 3.5–5.1)
Sodium: 140 mEq/L (ref 136–145)
TOTAL PROTEIN: 7.1 g/dL (ref 6.4–8.3)

## 2017-01-22 LAB — CBC WITH DIFFERENTIAL/PLATELET
BASO%: 0.4 % (ref 0.0–2.0)
Basophils Absolute: 0 10*3/uL (ref 0.0–0.1)
EOS%: 0.7 % (ref 0.0–7.0)
Eosinophils Absolute: 0 10*3/uL (ref 0.0–0.5)
HCT: 36.7 % (ref 34.8–46.6)
HGB: 12.3 g/dL (ref 11.6–15.9)
LYMPH#: 1.2 10*3/uL (ref 0.9–3.3)
LYMPH%: 26.6 % (ref 14.0–49.7)
MCH: 33.2 pg (ref 25.1–34.0)
MCHC: 33.4 g/dL (ref 31.5–36.0)
MCV: 99.5 fL (ref 79.5–101.0)
MONO#: 0.4 10*3/uL (ref 0.1–0.9)
MONO%: 8.1 % (ref 0.0–14.0)
NEUT%: 64.2 % (ref 38.4–76.8)
NEUTROS ABS: 2.9 10*3/uL (ref 1.5–6.5)
PLATELETS: 270 10*3/uL (ref 145–400)
RBC: 3.69 10*6/uL — ABNORMAL LOW (ref 3.70–5.45)
RDW: 14.3 % (ref 11.2–14.5)
WBC: 4.5 10*3/uL (ref 3.9–10.3)

## 2017-01-22 MED ORDER — SODIUM CHLORIDE 0.9 % IJ SOLN
10.0000 mL | INTRAMUSCULAR | Status: DC | PRN
  Administered 2017-01-22: 10 mL
  Filled 2017-01-22: qty 10

## 2017-01-22 MED ORDER — METHYLPREDNISOLONE 4 MG PO TBPK: ORAL_TABLET | ORAL | 0 refills | Status: DC

## 2017-01-22 MED ORDER — SODIUM CHLORIDE 0.9 % IV SOLN
400.0000 mg/m2 | Freq: Once | INTRAVENOUS | Status: AC
  Administered 2017-01-22: 675 mg via INTRAVENOUS
  Filled 2017-01-22: qty 7

## 2017-01-22 MED ORDER — HEPARIN SOD (PORK) LOCK FLUSH 100 UNIT/ML IV SOLN
500.0000 [IU] | Freq: Once | INTRAVENOUS | Status: AC | PRN
  Administered 2017-01-22: 500 [IU]
  Filled 2017-01-22: qty 5

## 2017-01-22 MED ORDER — SODIUM CHLORIDE 0.9 % IV SOLN
Freq: Once | INTRAVENOUS | Status: AC
  Administered 2017-01-22: 14:00:00 via INTRAVENOUS
  Filled 2017-01-22: qty 8

## 2017-01-22 MED ORDER — SODIUM CHLORIDE 0.9 % IV SOLN
Freq: Once | INTRAVENOUS | Status: AC
  Administered 2017-01-22: 13:00:00 via INTRAVENOUS

## 2017-01-22 NOTE — Patient Instructions (Signed)
[facility] Discharge Instructions for Patients Receiving Chemotherapy  Today you received the following chemotherapy agents: pemetrexed (Alimta)   To help prevent nausea and vomiting after your treatment, we encourage you to take your nausea medication as directed.    If you develop nausea and vomiting that is not controlled by your nausea medication, call the clinic.   BELOW ARE SYMPTOMS THAT SHOULD BE REPORTED IMMEDIATELY:  *FEVER GREATER THAN 100.5 F  *CHILLS WITH OR WITHOUT FEVER  NAUSEA AND VOMITING THAT IS NOT CONTROLLED WITH YOUR NAUSEA MEDICATION  *UNUSUAL SHORTNESS OF BREATH  *UNUSUAL BRUISING OR BLEEDING  TENDERNESS IN MOUTH AND THROAT WITH OR WITHOUT PRESENCE OF ULCERS  *URINARY PROBLEMS  *BOWEL PROBLEMS  UNUSUAL RASH Items with * indicate a potential emergency and should be followed up as soon as possible.  Feel free to call the clinic you have any questions or concerns. The clinic phone number is [phone].  Please show the [place] at check-in to the Emergency Department and triage nurse.

## 2017-01-22 NOTE — Telephone Encounter (Signed)
Scheduled appt pe r8/22 los - Gave patient AVS and calender per los.  

## 2017-01-22 NOTE — Progress Notes (Signed)
[place] [phone]   Fax:(336) [phone]  OFFICE PROGRESS NOTE  [name], MD [place] [address]  DIAGNOSIS: Recurrent non-small cell lung cancer, adenocarcinoma initially diagnosed as stage IB (T2, N0, M0) in June 2008 with tumor size of 8.0 cm.  PRIOR THERAPY: #1 status post left upper lobectomy with lymph node dissection under the care of Dr. Arlyce Dice on 06/29/2006.  #2 status post 4 cycles of adjuvant chemotherapy with cisplatin and Taxotere last dose given 08/01/2006.  #3 status post 6 cycles of systemic chemotherapy with carboplatin and Alimta for disease recurrence last dose given 12/12/2009.  #4 palliative radiotherapy to the enlarging right upper lobe lung mass under the care of Dr. Pablo Ledger completed on 11/14/2015.  CURRENT THERAPY: Maintenance systemic chemotherapy with single agent Alimta 400 MG/M2 every 3 weeks, status post 103 cycles.  INTERVAL HISTORY: [name] 83 y.o. female returns to the clinic today for follow-up visit. The patient is feeling fine today with no specific complaints except for the dry cough. She denied having any chest pain, shortness breath or hemoptysis. She denied having any weight loss or night sweats. She has no nausea, vomiting, diarrhea or constipation. She denied having any fever or chills. She has been tolerating her treatment fairly well. She had repeat CT scan of the chest performed recently and she is here for evaluation and discussion of her scan results.   MEDICAL HISTORY: Past Medical History:  Diagnosis Date  . FHx: chemotherapy 2008&2011   4 cycles cisplatin,taxotere,2008& carboplatin and Alimta 2011  . History of migraine headaches   . Hypercholesterolemia   . lung ca dx'd 06/2006   rt and lt lung  . Lung cancer ([place]) 3/14/1   right- Adenocarcinoma w/bronchioalveolar features  . Radiation 11/01/15-11/14/15   right upper lobe 30 gray  . Radiation pneumonitis ([place])  01/24/2016    ALLERGIES:  is allergic to simvastatin.  MEDICATIONS:  Current Outpatient Prescriptions  Medication Sig Dispense Refill  . albuterol (PROVENTIL HFA;VENTOLIN HFA) 108 (90 Base) MCG/ACT inhaler Inhale 1-2 puffs into the lungs every 6 (six) hours as needed for wheezing or shortness of breath. 1 Inhaler 2  . Calcium Carbonate-Vit D-Min (CALTRATE PLUS PO) Take 500 mg by mouth 2 (two) times daily. Reported on 12/13/2015    . chlorpheniramine-HYDROcodone (TUSSIONEX) 10-8 MG/5ML SUER Take 5 mLs by mouth 2 (two) times daily as needed for cough. 970 mL 0  . folic acid (FOLVITE) 1 MG tablet Take 1 mg by mouth daily. Reported on 12/13/2015    . Multiple Vitamins-Minerals (CENTRUM SILVER PO) Take 1 tablet by mouth daily. Reported on 12/13/2015    . prochlorperazine (COMPAZINE) 10 MG tablet Take 1 tablet (10 mg total) by mouth every 6 (six) hours as needed. 15 tablet 1  . vitamin B-12 (CYANOCOBALAMIN) 500 MCG tablet Take 1,500 mcg by mouth daily. Reported on 12/13/2015     No current facility-administered medications for this visit.     SURGICAL HISTORY:  Past Surgical History:  Procedure Laterality Date  . CATARACT EXTRACTION  2013   bilateral  . left  lowerlung lobectomy Left 06/19/2006   Dr.Burney,Left lower lobectomy  . Porta-cath  2011    REVIEW OF SYSTEMS:  Constitutional: positive for fatigue Eyes: negative Ears, nose, mouth, throat, and face: negative Respiratory: positive for cough Cardiovascular: negative Gastrointestinal: negative Genitourinary:negative Integument/breast: negative Hematologic/lymphatic: negative Musculoskeletal:negative Neurological: negative Behavioral/Psych: negative Endocrine: negative Allergic/Immunologic: negative   PHYSICAL EXAMINATION: General appearance:  alert, cooperative, fatigued and no distress Head: Normocephalic, without obvious abnormality, atraumatic Neck: no adenopathy, no JVD, supple, symmetrical, trachea midline and thyroid not  enlarged, symmetric, no tenderness/mass/nodules Lymph nodes: Cervical, supraclavicular, and axillary nodes normal. Resp: rales LUL and wheezes bilaterally Back: symmetric, no curvature. ROM normal. No CVA tenderness. Cardio: regular rate and rhythm, S1, S2 normal, no murmur, click, rub or gallop and normal apical impulse GI: soft, non-tender; bowel sounds normal; no masses,  no organomegaly Extremities: extremities normal, atraumatic, no cyanosis or edema Neurologic: Alert and oriented X 3, normal strength and tone. Normal symmetric reflexes. Normal coordination and gait  ECOG PERFORMANCE STATUS: 1 - Symptomatic but completely ambulatory  Blood pressure 129/62, pulse 83, temperature 98.2 F (36.8 C), temperature source Oral, resp. rate 18, height 5\' 2"  (1.575 m), weight 128 lb (58.1 kg), SpO2 100 %.  LABORATORY DATA: Lab Results  Component Value Date   WBC 3.5 (L) 01/01/2017   HGB 11.9 01/01/2017   HCT 36.1 01/01/2017   MCV 100.1 01/01/2017   PLT 247 01/01/2017      Chemistry      Component Value Date/Time   NA 140 01/01/2017 1154   K 4.0 01/01/2017 1154   CL 108 (H) 11/04/2012 0947   CO2 27 01/01/2017 1154   BUN 18.3 01/01/2017 1154   CREATININE 0.8 01/01/2017 1154      Component Value Date/Time   CALCIUM 8.9 01/01/2017 1154   ALKPHOS 78 01/01/2017 1154   AST 19 01/01/2017 1154   ALT 7 01/01/2017 1154   BILITOT 0.47 01/01/2017 1154       RADIOGRAPHIC STUDIES: Ct Chest Wo Contrast  Result Date: 01/20/2017 CLINICAL DATA:  Patient with history of bilateral lung cancer. On chemotherapy. Status post left lower lobectomy and radiation therapy. EXAM: CT CHEST WITHOUT CONTRAST TECHNIQUE: Multidetector CT imaging of the chest was performed following the standard protocol without IV contrast. COMPARISON:  CT chest 10/29/2016 FINDINGS: Cardiovascular: Right anterior chest wall Port-A-Cath is present with tip terminating in the superior vena cava. Normal heart size. Coronary  artery vascular calcifications. Aortic atherosclerosis. Mediastinum/Nodes: No enlarged axillary, mediastinal or hilar lymphadenopathy. Small amount of fluid within the esophagus. Lungs/Pleura: Postsurgical changes compatible with left lower lobectomy. New 6 mm subpleural nodule left lung (image 92; series 8). Unchanged 4 mm left upper lobe nodule (image 57; series 8). Grossly unchanged soft tissue density within the lingula measuring 2.6 x 1.8 cm (image 80; series 8), previously 2.5 x 1.7 cm. Re- demonstrated right upper and minimal superior segment right lower lobe radiation induced consolidation. There appears be slight interval increase in consolidation along the posterior aspect (image 54; series 7) (image 80 ; series 6). This is difficult to measure however appears increased when compared to prior exam. No pleural effusion or pneumothorax. Upper Abdomen: No acute process. Musculoskeletal: Thoracic spine degenerative changes. No aggressive or acute appearing osseous lesions. IMPRESSION: 1. Re- demonstrated postradiation changes involving the right upper right lower lobes. Along the posterior superior aspect of the postradiation changes there appears be increased consolidation which is nonspecific. Findings may represent continued evolution of radiation changes, infection or malignancy at this location. 2. Status post left lower lobectomy. Similar appearance of nodular opacity within the lingula which may represent malignancy. 3. Interval development of a 6 mm subpleural nodule within the left lung, recommend attention on follow-up. 4. Aortic atherosclerosis. Electronically Signed   By: Lovey Newcomer M.D.   On: 01/20/2017 14:30    ASSESSMENT AND PLAN:  This  is a very pleasant 83 years old white female with recurrent non-small cell lung cancer, adenocarcinoma status post induction systemic chemotherapy was carboplatin and Alimta with partial response. She is currently on maintenance treatment with single agent  Alimta status post 103 cycles. Patient continues to tolerate her current maintenance treatment fairly well. She had repeat CT scan of the chest performed recently that showed no clear evidence for disease progression except for development of 0.6 cm left subpleural nodule. I discussed the scan results with the patient today. I recommended for her to continue her current treatment with maintenance Alimta and she will proceed with cycle 104 today. I will continue to monitor this nodule on the upcoming scan. For the dry cough, this is most likely secondary to radiation pneumonitis. I will start the patient on Medrol Dosepak and she will continue with the cough medication for now. I will see her back for follow-up visit in 3 weeks for evaluation before the next dose of her chemotherapy. She was advised to call immediately if she has any concerning symptoms in the interval. The patient voices understanding of current disease status and treatment options and is in agreement with the current care plan. All questions were answered. The patient knows to call the clinic with any problems, questions or concerns. We can certainly see the patient much sooner if necessary.  Disclaimer: This note was dictated with voice recognition software. Similar sounding words can inadvertently be transcribed and may not be corrected upon review.

## 2017-02-05 ENCOUNTER — Ambulatory Visit: Payer: Medicare Other | Admitting: Internal Medicine

## 2017-02-05 ENCOUNTER — Other Ambulatory Visit: Payer: Medicare Other

## 2017-02-05 ENCOUNTER — Ambulatory Visit: Payer: Medicare Other

## 2017-02-11 ENCOUNTER — Encounter: Payer: Self-pay | Admitting: Pharmacist

## 2017-02-12 ENCOUNTER — Encounter: Payer: Self-pay | Admitting: Internal Medicine

## 2017-02-12 ENCOUNTER — Ambulatory Visit (HOSPITAL_BASED_OUTPATIENT_CLINIC_OR_DEPARTMENT_OTHER): Payer: Medicare Other

## 2017-02-12 ENCOUNTER — Ambulatory Visit (HOSPITAL_BASED_OUTPATIENT_CLINIC_OR_DEPARTMENT_OTHER): Payer: Medicare Other | Admitting: Internal Medicine

## 2017-02-12 ENCOUNTER — Other Ambulatory Visit (HOSPITAL_BASED_OUTPATIENT_CLINIC_OR_DEPARTMENT_OTHER): Payer: Medicare Other

## 2017-02-12 ENCOUNTER — Telehealth: Payer: Self-pay | Admitting: Internal Medicine

## 2017-02-12 VITALS — BP 143/67 | HR 81 | Temp 97.7°F | Resp 18 | Ht 62.0 in | Wt 127.1 lb

## 2017-02-12 DIAGNOSIS — Z5111 Encounter for antineoplastic chemotherapy: Secondary | ICD-10-CM

## 2017-02-12 DIAGNOSIS — R05 Cough: Secondary | ICD-10-CM | POA: Diagnosis not present

## 2017-02-12 DIAGNOSIS — C3492 Malignant neoplasm of unspecified part of left bronchus or lung: Principal | ICD-10-CM

## 2017-02-12 DIAGNOSIS — C3491 Malignant neoplasm of unspecified part of right bronchus or lung: Secondary | ICD-10-CM

## 2017-02-12 DIAGNOSIS — C3411 Malignant neoplasm of upper lobe, right bronchus or lung: Secondary | ICD-10-CM | POA: Diagnosis not present

## 2017-02-12 DIAGNOSIS — J700 Acute pulmonary manifestations due to radiation: Secondary | ICD-10-CM

## 2017-02-12 LAB — CBC WITH DIFFERENTIAL/PLATELET
BASO%: 0.3 % (ref 0.0–2.0)
Basophils Absolute: 0 10*3/uL (ref 0.0–0.1)
EOS%: 1.1 % (ref 0.0–7.0)
Eosinophils Absolute: 0 10*3/uL (ref 0.0–0.5)
HEMATOCRIT: 36.7 % (ref 34.8–46.6)
HGB: 12 g/dL (ref 11.6–15.9)
LYMPH#: 1.3 10*3/uL (ref 0.9–3.3)
LYMPH%: 36.6 % (ref 14.0–49.7)
MCH: 32.6 pg (ref 25.1–34.0)
MCHC: 32.7 g/dL (ref 31.5–36.0)
MCV: 99.7 fL (ref 79.5–101.0)
MONO#: 0.4 10*3/uL (ref 0.1–0.9)
MONO%: 10 % (ref 0.0–14.0)
NEUT#: 1.9 10*3/uL (ref 1.5–6.5)
NEUT%: 52 % (ref 38.4–76.8)
Platelets: 223 10*3/uL (ref 145–400)
RBC: 3.68 10*6/uL — ABNORMAL LOW (ref 3.70–5.45)
RDW: 14.9 % — ABNORMAL HIGH (ref 11.2–14.5)
WBC: 3.6 10*3/uL — ABNORMAL LOW (ref 3.9–10.3)

## 2017-02-12 LAB — COMPREHENSIVE METABOLIC PANEL
ALT: 9 U/L (ref 0–55)
ANION GAP: 6 meq/L (ref 3–11)
AST: 21 U/L (ref 5–34)
Albumin: 3.2 g/dL — ABNORMAL LOW (ref 3.5–5.0)
Alkaline Phosphatase: 84 U/L (ref 40–150)
BILIRUBIN TOTAL: 0.5 mg/dL (ref 0.20–1.20)
BUN: 14.6 mg/dL (ref 7.0–26.0)
CALCIUM: 9.2 mg/dL (ref 8.4–10.4)
CO2: 25 mEq/L (ref 22–29)
CREATININE: 0.8 mg/dL (ref 0.6–1.1)
Chloride: 108 mEq/L (ref 98–109)
EGFR: 69 mL/min/{1.73_m2} — ABNORMAL LOW (ref >90)
Glucose: 84 mg/dl (ref 70–140)
Potassium: 4.7 mEq/L (ref 3.5–5.1)
Sodium: 140 mEq/L (ref 136–145)
Total Protein: 7 g/dL (ref 6.4–8.3)

## 2017-02-12 MED ORDER — SODIUM CHLORIDE 0.9 % IV SOLN
400.0000 mg/m2 | Freq: Once | INTRAVENOUS | Status: AC
  Administered 2017-02-12: 675 mg via INTRAVENOUS
  Filled 2017-02-12: qty 20

## 2017-02-12 MED ORDER — CYANOCOBALAMIN 1000 MCG/ML IJ SOLN
1000.0000 ug | Freq: Once | INTRAMUSCULAR | Status: AC
  Administered 2017-02-12: 1000 ug via INTRAMUSCULAR

## 2017-02-12 MED ORDER — SODIUM CHLORIDE 0.9 % IV SOLN
Freq: Once | INTRAVENOUS | Status: AC
  Administered 2017-02-12: 11:00:00 via INTRAVENOUS
  Filled 2017-02-12: qty 8

## 2017-02-12 MED ORDER — SODIUM CHLORIDE 0.9 % IJ SOLN
10.0000 mL | INTRAMUSCULAR | Status: DC | PRN
  Administered 2017-02-12: 10 mL
  Filled 2017-02-12: qty 10

## 2017-02-12 MED ORDER — SODIUM CHLORIDE 0.9 % IV SOLN
Freq: Once | INTRAVENOUS | Status: AC
  Administered 2017-02-12: 11:00:00 via INTRAVENOUS

## 2017-02-12 MED ORDER — HEPARIN SOD (PORK) LOCK FLUSH 100 UNIT/ML IV SOLN
500.0000 [IU] | Freq: Once | INTRAVENOUS | Status: AC | PRN
  Administered 2017-02-12: 500 [IU]
  Filled 2017-02-12: qty 5

## 2017-02-12 MED ORDER — CYANOCOBALAMIN 1000 MCG/ML IJ SOLN
INTRAMUSCULAR | Status: AC
  Filled 2017-02-12: qty 1

## 2017-02-12 NOTE — Patient Instructions (Signed)
[facility] Discharge Instructions for Patients Receiving Chemotherapy  Today you received the following chemotherapy agents; Alimta.   To help prevent nausea and vomiting after your treatment, we encourage you to take your nausea medication as directed.    If you develop nausea and vomiting that is not controlled by your nausea medication, call the clinic.   BELOW ARE SYMPTOMS THAT SHOULD BE REPORTED IMMEDIATELY:  *FEVER GREATER THAN 100.5 F  *CHILLS WITH OR WITHOUT FEVER  NAUSEA AND VOMITING THAT IS NOT CONTROLLED WITH YOUR NAUSEA MEDICATION  *UNUSUAL SHORTNESS OF BREATH  *UNUSUAL BRUISING OR BLEEDING  TENDERNESS IN MOUTH AND THROAT WITH OR WITHOUT PRESENCE OF ULCERS  *URINARY PROBLEMS  *BOWEL PROBLEMS  UNUSUAL RASH Items with * indicate a potential emergency and should be followed up as soon as possible.  Feel free to call the clinic you have any questions or concerns. The clinic phone number is [phone].  Please show the CHEMO ALERT CARD at check-in to the Emergency Department and triage nurse.   

## 2017-02-12 NOTE — Telephone Encounter (Signed)
Scheduled additional appts per 9/12 los - patient to get new schedule next visit.

## 2017-02-12 NOTE — Progress Notes (Signed)
[place] [phone]   Fax:(336) [phone]  OFFICE PROGRESS NOTE  [name], MD [name] [address]  DIAGNOSIS: Recurrent non-small cell lung cancer, adenocarcinoma initially diagnosed as stage IB (T2, N0, M0) in June 2008 with tumor size of 8.0 cm.  PRIOR THERAPY: #1 status post left upper lobectomy with lymph node dissection under the care of Dr. Arlyce Dice on 06/29/2006.  #2 status post 4 cycles of adjuvant chemotherapy with cisplatin and Taxotere last dose given 08/01/2006.  #3 status post 6 cycles of systemic chemotherapy with carboplatin and Alimta for disease recurrence last dose given 12/12/2009.  #4 palliative radiotherapy to the enlarging right upper lobe lung mass under the care of Dr. Pablo Ledger completed on 11/14/2015.  CURRENT THERAPY: Maintenance systemic chemotherapy with single agent Alimta 400 MG/M2 every 3 weeks, status post 104 cycles.  INTERVAL HISTORY: [name] 83 y.o. female returns to the clinic today for follow-up visit. The patient is feeling fine today with no specific complaints. She denied having any chest pain, shortness of breath but continues to have dry cough and she is currently on Tussionex. She denied having any hemoptysis. She has no nausea, vomiting, diarrhea or constipation. She denied having any fever or chills. She has no significant weight loss or night sweats. She is here today for evaluation before starting the next cycle of her treatment.  MEDICAL HISTORY: Past Medical History:  Diagnosis Date  . FHx: chemotherapy 2008&2011   4 cycles cisplatin,taxotere,2008& carboplatin and Alimta 2011  . History of migraine headaches   . Hypercholesterolemia   . lung ca dx'd 06/2006   rt and lt lung  . Lung cancer ([address]) 3/14/1   right- Adenocarcinoma w/bronchioalveolar features  . Radiation 11/01/15-11/14/15   right upper lobe 30 gray  . Radiation pneumonitis ([place]) 01/24/2016    ALLERGIES:   is allergic to simvastatin.  MEDICATIONS:  Current Outpatient Prescriptions  Medication Sig Dispense Refill  . albuterol (PROVENTIL HFA;VENTOLIN HFA) 108 (90 Base) MCG/ACT inhaler Inhale 1-2 puffs into the lungs every 6 (six) hours as needed for wheezing or shortness of breath. 1 Inhaler 2  . Calcium Carbonate-Vit D-Min (CALTRATE PLUS PO) Take 500 mg by mouth 2 (two) times daily. Reported on 12/13/2015    . chlorpheniramine-HYDROcodone (TUSSIONEX) 10-8 MG/5ML SUER Take 5 mLs by mouth 2 (two) times daily as needed for cough. 517 mL 0  . folic acid (FOLVITE) 1 MG tablet Take 1 mg by mouth daily. Reported on 12/13/2015    . methylPREDNISolone (MEDROL DOSEPAK) 4 MG TBPK tablet Use as instructed 21 tablet 0  . Multiple Vitamins-Minerals (CENTRUM SILVER PO) Take 1 tablet by mouth daily. Reported on 12/13/2015    . prochlorperazine (COMPAZINE) 10 MG tablet Take 1 tablet (10 mg total) by mouth every 6 (six) hours as needed. 15 tablet 1  . vitamin B-12 (CYANOCOBALAMIN) 500 MCG tablet Take 1,500 mcg by mouth daily. Reported on 12/13/2015     No current facility-administered medications for this visit.     SURGICAL HISTORY:  Past Surgical History:  Procedure Laterality Date  . CATARACT EXTRACTION  2013   bilateral  . left  lowerlung lobectomy Left 06/19/2006   Dr.Burney,Left lower lobectomy  . Porta-cath  2011    REVIEW OF SYSTEMS:  A comprehensive review of systems was negative except for: Respiratory: positive for cough   PHYSICAL EXAMINATION: General appearance: alert, cooperative and no distress Head: Normocephalic, without obvious abnormality, atraumatic Neck: no  adenopathy, no JVD, supple, symmetrical, trachea midline and thyroid not enlarged, symmetric, no tenderness/mass/nodules Lymph nodes: Cervical, supraclavicular, and axillary nodes normal. Resp: rales LUL and wheezes bilaterally Back: symmetric, no curvature. ROM normal. No CVA tenderness. Cardio: regular rate and rhythm, S1, S2  normal, no murmur, click, rub or gallop and normal apical impulse GI: soft, non-tender; bowel sounds normal; no masses,  no organomegaly Extremities: extremities normal, atraumatic, no cyanosis or edema  ECOG PERFORMANCE STATUS: 1 - Symptomatic but completely ambulatory  Blood pressure (!) 143/67, pulse 81, temperature 97.7 F (36.5 C), temperature source Oral, resp. rate 18, height 5\' 2"  (1.575 m), weight 127 lb 1.6 oz (57.7 kg), SpO2 100 %.  LABORATORY DATA: Lab Results  Component Value Date   WBC 3.6 (L) 02/12/2017   HGB 12.0 02/12/2017   HCT 36.7 02/12/2017   MCV 99.7 02/12/2017   PLT 223 02/12/2017      Chemistry      Component Value Date/Time   NA 140 01/22/2017 1208   K 4.4 01/22/2017 1208   CL 108 (H) 11/04/2012 0947   CO2 26 01/22/2017 1208   BUN 16.3 01/22/2017 1208   CREATININE 0.8 01/22/2017 1208      Component Value Date/Time   CALCIUM 9.1 01/22/2017 1208   ALKPHOS 82 01/22/2017 1208   AST 20 01/22/2017 1208   ALT 7 01/22/2017 1208   BILITOT 0.48 01/22/2017 1208       RADIOGRAPHIC STUDIES: Ct Chest Wo Contrast  Result Date: 01/20/2017 CLINICAL DATA:  Patient with history of bilateral lung cancer. On chemotherapy. Status post left lower lobectomy and radiation therapy. EXAM: CT CHEST WITHOUT CONTRAST TECHNIQUE: Multidetector CT imaging of the chest was performed following the standard protocol without IV contrast. COMPARISON:  CT chest 10/29/2016 FINDINGS: Cardiovascular: Right anterior chest wall Port-A-Cath is present with tip terminating in the superior vena cava. Normal heart size. Coronary artery vascular calcifications. Aortic atherosclerosis. Mediastinum/Nodes: No enlarged axillary, mediastinal or hilar lymphadenopathy. Small amount of fluid within the esophagus. Lungs/Pleura: Postsurgical changes compatible with left lower lobectomy. New 6 mm subpleural nodule left lung (image 92; series 8). Unchanged 4 mm left upper lobe nodule (image 57; series 8).  Grossly unchanged soft tissue density within the lingula measuring 2.6 x 1.8 cm (image 80; series 8), previously 2.5 x 1.7 cm. Re- demonstrated right upper and minimal superior segment right lower lobe radiation induced consolidation. There appears be slight interval increase in consolidation along the posterior aspect (image 54; series 7) (image 80 ; series 6). This is difficult to measure however appears increased when compared to prior exam. No pleural effusion or pneumothorax. Upper Abdomen: No acute process. Musculoskeletal: Thoracic spine degenerative changes. No aggressive or acute appearing osseous lesions. IMPRESSION: 1. Re- demonstrated postradiation changes involving the right upper right lower lobes. Along the posterior superior aspect of the postradiation changes there appears be increased consolidation which is nonspecific. Findings may represent continued evolution of radiation changes, infection or malignancy at this location. 2. Status post left lower lobectomy. Similar appearance of nodular opacity within the lingula which may represent malignancy. 3. Interval development of a 6 mm subpleural nodule within the left lung, recommend attention on follow-up. 4. Aortic atherosclerosis. Electronically Signed   By: Lovey Newcomer M.D.   On: 01/20/2017 14:30    ASSESSMENT AND PLAN:  This is a very pleasant 83 years old white female with recurrent non-small cell lung cancer, adenocarcinoma status post induction systemic chemotherapy was carboplatin and Alimta with partial response.  She is currently on maintenance treatment with single agent Alimta status post 104 cycles. She tolerated the last cycle of her treatment well. I recommended for her to proceed with cycle 105 today. The patient would come back for follow-up visit in 3 weeks for evaluation before the next cycle of her treatment. For the dry cough she will continue on Tussionex for now. The patient was advised to call immediately if she has  any concerning symptoms in the interval. The patient voices understanding of current disease status and treatment options and is in agreement with the current care plan. All questions were answered. The patient knows to call the clinic with any problems, questions or concerns. We can certainly see the patient much sooner if necessary.  Disclaimer: This note was dictated with voice recognition software. Similar sounding words can inadvertently be transcribed and may not be corrected upon review.

## 2017-02-18 ENCOUNTER — Ambulatory Visit: Payer: Medicare Other | Admitting: Internal Medicine

## 2017-02-18 ENCOUNTER — Other Ambulatory Visit: Payer: Medicare Other

## 2017-02-19 ENCOUNTER — Ambulatory Visit: Payer: Medicare Other

## 2017-03-05 ENCOUNTER — Ambulatory Visit (HOSPITAL_BASED_OUTPATIENT_CLINIC_OR_DEPARTMENT_OTHER): Payer: Medicare Other | Admitting: Internal Medicine

## 2017-03-05 ENCOUNTER — Ambulatory Visit (HOSPITAL_BASED_OUTPATIENT_CLINIC_OR_DEPARTMENT_OTHER): Payer: Medicare Other

## 2017-03-05 ENCOUNTER — Encounter: Payer: Self-pay | Admitting: Internal Medicine

## 2017-03-05 ENCOUNTER — Telehealth: Payer: Self-pay | Admitting: Internal Medicine

## 2017-03-05 ENCOUNTER — Other Ambulatory Visit (HOSPITAL_BASED_OUTPATIENT_CLINIC_OR_DEPARTMENT_OTHER): Payer: Medicare Other

## 2017-03-05 DIAGNOSIS — Z5111 Encounter for antineoplastic chemotherapy: Secondary | ICD-10-CM

## 2017-03-05 DIAGNOSIS — C3411 Malignant neoplasm of upper lobe, right bronchus or lung: Secondary | ICD-10-CM

## 2017-03-05 DIAGNOSIS — C3492 Malignant neoplasm of unspecified part of left bronchus or lung: Principal | ICD-10-CM

## 2017-03-05 DIAGNOSIS — C3491 Malignant neoplasm of unspecified part of right bronchus or lung: Secondary | ICD-10-CM

## 2017-03-05 DIAGNOSIS — R059 Cough, unspecified: Secondary | ICD-10-CM

## 2017-03-05 DIAGNOSIS — R05 Cough: Secondary | ICD-10-CM

## 2017-03-05 DIAGNOSIS — J700 Acute pulmonary manifestations due to radiation: Secondary | ICD-10-CM

## 2017-03-05 LAB — COMPREHENSIVE METABOLIC PANEL
ALT: <6 U/L (ref 0–55)
AST: 20 U/L (ref 5–34)
Albumin: 3.2 g/dL — ABNORMAL LOW (ref 3.5–5.0)
Alkaline Phosphatase: 83 U/L (ref 40–150)
Anion Gap: 6 mEq/L (ref 3–11)
BILIRUBIN TOTAL: 0.58 mg/dL (ref 0.20–1.20)
BUN: 14 mg/dL (ref 7.0–26.0)
CHLORIDE: 110 meq/L — AB (ref 98–109)
CO2: 26 meq/L (ref 22–29)
Calcium: 9.1 mg/dL (ref 8.4–10.4)
Creatinine: 0.8 mg/dL (ref 0.6–1.1)
EGFR: 65 mL/min/{1.73_m2} — AB (ref >90)
GLUCOSE: 90 mg/dL (ref 70–140)
POTASSIUM: 4.1 meq/L (ref 3.5–5.1)
SODIUM: 142 meq/L (ref 136–145)
TOTAL PROTEIN: 7 g/dL (ref 6.4–8.3)

## 2017-03-05 LAB — CBC WITH DIFFERENTIAL/PLATELET
BASO%: 0.4 % (ref 0.0–2.0)
Basophils Absolute: 0 10*3/uL (ref 0.0–0.1)
EOS ABS: 0 10*3/uL (ref 0.0–0.5)
EOS%: 0.8 % (ref 0.0–7.0)
HCT: 36.9 % (ref 34.8–46.6)
HGB: 12.4 g/dL (ref 11.6–15.9)
LYMPH%: 32.2 % (ref 14.0–49.7)
MCH: 33.5 pg (ref 25.1–34.0)
MCHC: 33.6 g/dL (ref 31.5–36.0)
MCV: 99.6 fL (ref 79.5–101.0)
MONO#: 0.4 10*3/uL (ref 0.1–0.9)
MONO%: 10.3 % (ref 0.0–14.0)
NEUT%: 56.3 % (ref 38.4–76.8)
NEUTROS ABS: 2.2 10*3/uL (ref 1.5–6.5)
Platelets: 245 10*3/uL (ref 145–400)
RBC: 3.7 10*6/uL (ref 3.70–5.45)
RDW: 14.7 % — ABNORMAL HIGH (ref 11.2–14.5)
WBC: 3.9 10*3/uL (ref 3.9–10.3)
lymph#: 1.3 10*3/uL (ref 0.9–3.3)

## 2017-03-05 MED ORDER — HEPARIN SOD (PORK) LOCK FLUSH 100 UNIT/ML IV SOLN
500.0000 [IU] | Freq: Once | INTRAVENOUS | Status: AC | PRN
  Administered 2017-03-05: 500 [IU]
  Filled 2017-03-05: qty 5

## 2017-03-05 MED ORDER — SODIUM CHLORIDE 0.9 % IJ SOLN
10.0000 mL | INTRAMUSCULAR | Status: DC | PRN
  Administered 2017-03-05: 10 mL
  Filled 2017-03-05: qty 10

## 2017-03-05 MED ORDER — DEXAMETHASONE SODIUM PHOSPHATE 100 MG/10ML IJ SOLN
Freq: Once | INTRAMUSCULAR | Status: AC
  Administered 2017-03-05: 13:00:00 via INTRAVENOUS
  Filled 2017-03-05: qty 8

## 2017-03-05 MED ORDER — SODIUM CHLORIDE 0.9 % IV SOLN
Freq: Once | INTRAVENOUS | Status: AC
  Administered 2017-03-05: 12:00:00 via INTRAVENOUS

## 2017-03-05 MED ORDER — HYDROCOD POLST-CPM POLST ER 10-8 MG/5ML PO SUER: 5.0000 mL | Freq: Two times a day (BID) | ORAL | 0 refills | Status: DC | PRN

## 2017-03-05 MED ORDER — SODIUM CHLORIDE 0.9 % IV SOLN
400.0000 mg/m2 | Freq: Once | INTRAVENOUS | Status: AC
  Administered 2017-03-05: 675 mg via INTRAVENOUS
  Filled 2017-03-05: qty 20

## 2017-03-05 NOTE — Progress Notes (Signed)
[place] [phone]   Fax:(336) [phone]  OFFICE PROGRESS NOTE  [name], MD [place] [address]  DIAGNOSIS: Recurrent non-small cell lung cancer, adenocarcinoma initially diagnosed as stage IB (T2, N0, M0) in June 2008 with tumor size of 8.0 cm.  PRIOR THERAPY: #1 status post left upper lobectomy with lymph node dissection under the care of Dr. Arlyce Dice on 06/29/2006.  #2 status post 4 cycles of adjuvant chemotherapy with cisplatin and Taxotere last dose given 08/01/2006.  #3 status post 6 cycles of systemic chemotherapy with carboplatin and Alimta for disease recurrence last dose given 12/12/2009.  #4 palliative radiotherapy to the enlarging right upper lobe lung mass under the care of Dr. Pablo Ledger completed on 11/14/2015.  CURRENT THERAPY: Maintenance systemic chemotherapy with single agent Alimta 400 MG/M2 every 3 weeks, status post 105 cycles.  INTERVAL HISTORY: [name] 83 y.o. female returns to the clinic today for follow-up visit. The patient is feeling fine today with no specific complaints. She denied having any chest pain, shortness of breath, ut continues to have drycough with no hemoptysis. She denied having any fever or chills. She has no nausea, vomiting, diarrhea or constipation. She has no weight loss or night sweats. She is here today for evaluation before starting cycle #106 of her chemotherapy.   MEDICAL HISTORY: Past Medical History:  Diagnosis Date  . FHx: chemotherapy 2008&2011   4 cycles cisplatin,taxotere,2008& carboplatin and Alimta 2011  . History of migraine headaches   . Hypercholesterolemia   . lung ca dx'd 06/2006   rt and lt lung  . Lung cancer ([place]) 3/14/1   right- Adenocarcinoma w/bronchioalveolar features  . Radiation 11/01/15-11/14/15   right upper lobe 30 gray  . Radiation pneumonitis ([place]) 01/24/2016    ALLERGIES:  is allergic to simvastatin.  MEDICATIONS:  Current  Outpatient Prescriptions  Medication Sig Dispense Refill  . albuterol (PROVENTIL HFA;VENTOLIN HFA) 108 (90 Base) MCG/ACT inhaler Inhale 1-2 puffs into the lungs every 6 (six) hours as needed for wheezing or shortness of breath. 1 Inhaler 2  . Calcium Carbonate-Vit D-Min (CALTRATE PLUS PO) Take 500 mg by mouth 2 (two) times daily. Reported on 12/13/2015    . chlorpheniramine-HYDROcodone (TUSSIONEX) 10-8 MG/5ML SUER Take 5 mLs by mouth 2 (two) times daily as needed for cough. 889 mL 0  . folic acid (FOLVITE) 1 MG tablet Take 1 mg by mouth daily. Reported on 12/13/2015    . Multiple Vitamins-Minerals (CENTRUM SILVER PO) Take 1 tablet by mouth daily. Reported on 12/13/2015    . vitamin B-12 (CYANOCOBALAMIN) 500 MCG tablet Take 1,500 mcg by mouth daily. Reported on 12/13/2015    . prochlorperazine (COMPAZINE) 10 MG tablet Take 1 tablet (10 mg total) by mouth every 6 (six) hours as needed. (Patient not taking: Reported on 03/05/2017) 15 tablet 1   No current facility-administered medications for this visit.     SURGICAL HISTORY:  Past Surgical History:  Procedure Laterality Date  . CATARACT EXTRACTION  2013   bilateral  . left  lowerlung lobectomy Left 06/19/2006   Dr.Burney,Left lower lobectomy  . Porta-cath  2011    REVIEW OF SYSTEMS:  A comprehensive review of systems was negative except for: Respiratory: positive for cough   PHYSICAL EXAMINATION: General appearance: alert, cooperative and no distress Head: Normocephalic, without obvious abnormality, atraumatic Neck: no adenopathy, no JVD, supple, symmetrical, trachea midline and thyroid not enlarged, symmetric, no tenderness/mass/nodules Lymph nodes: Cervical, supraclavicular, and  axillary nodes normal. Resp: rales LUL and wheezes bilaterally Back: symmetric, no curvature. ROM normal. No CVA tenderness. Cardio: regular rate and rhythm, S1, S2 normal, no murmur, click, rub or gallop and normal apical impulse GI: soft, non-tender; bowel sounds  normal; no masses,  no organomegaly Extremities: extremities normal, atraumatic, no cyanosis or edema  ECOG PERFORMANCE STATUS: 1 - Symptomatic but completely ambulatory  Blood pressure 134/72, pulse 70, temperature 98 F (36.7 C), temperature source Oral, resp. rate 18, height 5\' 2"  (1.575 m), weight 127 lb 3.2 oz (57.7 kg), SpO2 100 %.  LABORATORY DATA: Lab Results  Component Value Date   WBC 3.9 03/05/2017   HGB 12.4 03/05/2017   HCT 36.9 03/05/2017   MCV 99.6 03/05/2017   PLT 245 03/05/2017      Chemistry      Component Value Date/Time   NA 140 02/12/2017 1001   K 4.7 02/12/2017 1001   CL 108 (H) 11/04/2012 0947   CO2 25 02/12/2017 1001   BUN 14.6 02/12/2017 1001   CREATININE 0.8 02/12/2017 1001      Component Value Date/Time   CALCIUM 9.2 02/12/2017 1001   ALKPHOS 84 02/12/2017 1001   AST 21 02/12/2017 1001   ALT 9 02/12/2017 1001   BILITOT 0.50 02/12/2017 1001       RADIOGRAPHIC STUDIES: No results found.  ASSESSMENT AND PLAN:  This is a very pleasant 83 years old white female with recurrent non-small cell lung cancer, adenocarcinoma status post induction systemic chemotherapy was carboplatin and Alimta with partial response. She is currently on maintenance treatment with single agent Alimta status post 105 cycles. The patient continues to tolerate her treatment fairly well with no significant adverse effects. She will proceed with cycle 106 today as scheduled. For the dry cough, I gave her refill Tussionex. I will see the patient back for follow-up visit in 3 weeks for reevaluation before the next dose of her treatment. She was advised to call immediately if she has any concerning symptoms in the interval. The patient voices understanding of current disease status and treatment options and is in agreement with the current care plan. All questions were answered. The patient knows to call the clinic with any problems, questions or concerns. We can certainly see  the patient much sooner if necessary.  Disclaimer: This note was dictated with voice recognition software. Similar sounding words can inadvertently be transcribed and may not be corrected upon review.

## 2017-03-05 NOTE — Patient Instructions (Signed)
[facility] Discharge Instructions for Patients Receiving Chemotherapy  Today you received the following chemotherapy agents; Alimta.   To help prevent nausea and vomiting after your treatment, we encourage you to take your nausea medication as directed.    If you develop nausea and vomiting that is not controlled by your nausea medication, call the clinic.   BELOW ARE SYMPTOMS THAT SHOULD BE REPORTED IMMEDIATELY:  *FEVER GREATER THAN 100.5 F  *CHILLS WITH OR WITHOUT FEVER  NAUSEA AND VOMITING THAT IS NOT CONTROLLED WITH YOUR NAUSEA MEDICATION  *UNUSUAL SHORTNESS OF BREATH  *UNUSUAL BRUISING OR BLEEDING  TENDERNESS IN MOUTH AND THROAT WITH OR WITHOUT PRESENCE OF ULCERS  *URINARY PROBLEMS  *BOWEL PROBLEMS  UNUSUAL RASH Items with * indicate a potential emergency and should be followed up as soon as possible.  Feel free to call the clinic you have any questions or concerns. The clinic phone number is [phone].  Please show the CHEMO ALERT CARD at check-in to the Emergency Department and triage nurse.   

## 2017-03-05 NOTE — Telephone Encounter (Signed)
Patient will receive calendar of updated appointments in treatment area.

## 2017-03-26 ENCOUNTER — Encounter: Payer: Self-pay | Admitting: Internal Medicine

## 2017-03-26 ENCOUNTER — Other Ambulatory Visit (HOSPITAL_BASED_OUTPATIENT_CLINIC_OR_DEPARTMENT_OTHER): Payer: Medicare Other

## 2017-03-26 ENCOUNTER — Ambulatory Visit: Payer: Medicare Other | Admitting: Internal Medicine

## 2017-03-26 ENCOUNTER — Ambulatory Visit: Payer: Medicare Other

## 2017-03-26 ENCOUNTER — Ambulatory Visit (HOSPITAL_BASED_OUTPATIENT_CLINIC_OR_DEPARTMENT_OTHER): Payer: Medicare Other

## 2017-03-26 ENCOUNTER — Other Ambulatory Visit: Payer: Medicare Other

## 2017-03-26 ENCOUNTER — Ambulatory Visit (HOSPITAL_BASED_OUTPATIENT_CLINIC_OR_DEPARTMENT_OTHER): Payer: Medicare Other | Admitting: Internal Medicine

## 2017-03-26 VITALS — BP 122/58 | HR 76 | Temp 98.6°F | Resp 20 | Ht 62.0 in | Wt 127.1 lb

## 2017-03-26 DIAGNOSIS — Z23 Encounter for immunization: Secondary | ICD-10-CM | POA: Diagnosis not present

## 2017-03-26 DIAGNOSIS — R05 Cough: Secondary | ICD-10-CM | POA: Diagnosis not present

## 2017-03-26 DIAGNOSIS — Z5111 Encounter for antineoplastic chemotherapy: Secondary | ICD-10-CM | POA: Diagnosis not present

## 2017-03-26 DIAGNOSIS — C3492 Malignant neoplasm of unspecified part of left bronchus or lung: Principal | ICD-10-CM

## 2017-03-26 DIAGNOSIS — C3411 Malignant neoplasm of upper lobe, right bronchus or lung: Secondary | ICD-10-CM

## 2017-03-26 DIAGNOSIS — C3491 Malignant neoplasm of unspecified part of right bronchus or lung: Secondary | ICD-10-CM

## 2017-03-26 LAB — CBC WITH DIFFERENTIAL/PLATELET
BASO%: 0.5 % (ref 0.0–2.0)
Basophils Absolute: 0 10*3/uL (ref 0.0–0.1)
EOS%: 0.4 % (ref 0.0–7.0)
Eosinophils Absolute: 0 10*3/uL (ref 0.0–0.5)
HCT: 35.5 % (ref 34.8–46.6)
HGB: 11.9 g/dL (ref 11.6–15.9)
LYMPH%: 28.6 % (ref 14.0–49.7)
MCH: 33.3 pg (ref 25.1–34.0)
MCHC: 33.5 g/dL (ref 31.5–36.0)
MCV: 99.5 fL (ref 79.5–101.0)
MONO#: 0.4 10*3/uL (ref 0.1–0.9)
MONO%: 9.6 % (ref 0.0–14.0)
NEUT#: 2.5 10*3/uL (ref 1.5–6.5)
NEUT%: 60.9 % (ref 38.4–76.8)
Platelets: 256 10*3/uL (ref 145–400)
RBC: 3.57 10*6/uL — AB (ref 3.70–5.45)
RDW: 15.2 % — AB (ref 11.2–14.5)
WBC: 4.1 10*3/uL (ref 3.9–10.3)
lymph#: 1.2 10*3/uL (ref 0.9–3.3)

## 2017-03-26 LAB — COMPREHENSIVE METABOLIC PANEL
ALK PHOS: 71 U/L (ref 40–150)
ALT: 8 U/L (ref 0–55)
AST: 20 U/L (ref 5–34)
Albumin: 3.2 g/dL — ABNORMAL LOW (ref 3.5–5.0)
Anion Gap: 6 mEq/L (ref 3–11)
BILIRUBIN TOTAL: 0.42 mg/dL (ref 0.20–1.20)
BUN: 17.2 mg/dL (ref 7.0–26.0)
CO2: 26 mEq/L (ref 22–29)
CREATININE: 0.8 mg/dL (ref 0.6–1.1)
Calcium: 8.8 mg/dL (ref 8.4–10.4)
Chloride: 108 mEq/L (ref 98–109)
EGFR: >60 mL/min/{1.73_m2} (ref >60)
GLUCOSE: 105 mg/dL (ref 70–140)
Potassium: 4.2 mEq/L (ref 3.5–5.1)
SODIUM: 140 meq/L (ref 136–145)
Total Protein: 6.9 g/dL (ref 6.4–8.3)

## 2017-03-26 MED ORDER — INFLUENZA VAC SPLIT QUAD 0.5 ML IM SUSY
0.5000 mL | PREFILLED_SYRINGE | Freq: Once | INTRAMUSCULAR | Status: AC
  Administered 2017-03-26: 0.5 mL via INTRAMUSCULAR
  Filled 2017-03-26: qty 0.5

## 2017-03-26 MED ORDER — SODIUM CHLORIDE 0.9 % IV SOLN
Freq: Once | INTRAVENOUS | Status: AC
  Administered 2017-03-26: 14:00:00 via INTRAVENOUS
  Filled 2017-03-26: qty 8

## 2017-03-26 MED ORDER — SODIUM CHLORIDE 0.9 % IV SOLN
Freq: Once | INTRAVENOUS | Status: AC
  Administered 2017-03-26: 13:00:00 via INTRAVENOUS

## 2017-03-26 MED ORDER — SODIUM CHLORIDE 0.9 % IJ SOLN
10.0000 mL | INTRAMUSCULAR | Status: DC | PRN
  Administered 2017-03-26: 10 mL
  Filled 2017-03-26: qty 10

## 2017-03-26 MED ORDER — HEPARIN SOD (PORK) LOCK FLUSH 100 UNIT/ML IV SOLN
500.0000 [IU] | Freq: Once | INTRAVENOUS | Status: AC | PRN
  Administered 2017-03-26: 500 [IU]
  Filled 2017-03-26: qty 5

## 2017-03-26 MED ORDER — SODIUM CHLORIDE 0.9 % IV SOLN
400.0000 mg/m2 | Freq: Once | INTRAVENOUS | Status: AC
  Administered 2017-03-26: 675 mg via INTRAVENOUS
  Filled 2017-03-26: qty 20

## 2017-03-26 NOTE — Progress Notes (Signed)
[place] [phone]) [phone]   Fax:(336) [phone]  OFFICE PROGRESS NOTE  [name], MD [place] [address]  DIAGNOSIS: Recurrent non-small cell lung cancer, adenocarcinoma initially diagnosed as stage IB (T2, N0, M0) in June 2008 with tumor size of 8.0 cm.  PRIOR THERAPY: #1 status post left upper lobectomy with lymph node dissection under the care of Dr. Arlyce Dice on 06/29/2006.  #2 status post 4 cycles of adjuvant chemotherapy with cisplatin and Taxotere last dose given 08/01/2006.  #3 status post 6 cycles of systemic chemotherapy with carboplatin and Alimta for disease recurrence last dose given 12/12/2009.  #4 palliative radiotherapy to the enlarging right upper lobe lung mass under the care of Dr. Pablo Ledger completed on 11/14/2015.  CURRENT THERAPY: Maintenance systemic chemotherapy with single agent Alimta 400 MG/M2 every 3 weeks, status post 106 cycles.  INTERVAL HISTORY: [name] 83 y.o. female returns to the clinic today for follow-up visit. The patient has no complaints today except for the dry cough. She continues to tolerate her maintenance Alimta fairly well. She denied having any chest pain, shortness of breath or hemoptysis. She denied having any fever or chills. She has no significant weight loss or night sweats. The patient has no headache or visual changes. She is here today for evaluation before starting cycle #107.   MEDICAL HISTORY: Past Medical History:  Diagnosis Date  . FHx: chemotherapy 2008&2011   4 cycles cisplatin,taxotere,2008& carboplatin and Alimta 2011  . History of migraine headaches   . Hypercholesterolemia   . lung ca dx'd 06/2006   rt and lt lung  . Lung cancer ([place]) 3/14/1   right- Adenocarcinoma w/bronchioalveolar features  . Radiation 11/01/15-11/14/15   right upper lobe 30 gray  . Radiation pneumonitis ([place]) 01/24/2016    ALLERGIES:  is allergic to simvastatin.  MEDICATIONS:    Current Outpatient Prescriptions  Medication Sig Dispense Refill  . albuterol (PROVENTIL HFA;VENTOLIN HFA) 108 (90 Base) MCG/ACT inhaler Inhale 1-2 puffs into the lungs every 6 (six) hours as needed for wheezing or shortness of breath. 1 Inhaler 2  . Calcium Carbonate-Vit D-Min (CALTRATE PLUS PO) Take 500 mg by mouth 2 (two) times daily. Reported on 12/13/2015    . chlorpheniramine-HYDROcodone (TUSSIONEX) 10-8 MG/5ML SUER Take 5 mLs by mouth 2 (two) times daily as needed for cough. 381 mL 0  . folic acid (FOLVITE) 1 MG tablet Take 1 mg by mouth daily. Reported on 12/13/2015    . Multiple Vitamins-Minerals (CENTRUM SILVER PO) Take 1 tablet by mouth daily. Reported on 12/13/2015    . prochlorperazine (COMPAZINE) 10 MG tablet Take 1 tablet (10 mg total) by mouth every 6 (six) hours as needed. (Patient not taking: Reported on 03/05/2017) 15 tablet 1  . vitamin B-12 (CYANOCOBALAMIN) 500 MCG tablet Take 1,500 mcg by mouth daily. Reported on 12/13/2015     No current facility-administered medications for this visit.     SURGICAL HISTORY:  Past Surgical History:  Procedure Laterality Date  . CATARACT EXTRACTION  2013   bilateral  . left  lowerlung lobectomy Left 06/19/2006   Dr.Burney,Left lower lobectomy  . Porta-cath  2011    REVIEW OF SYSTEMS:  A comprehensive review of systems was negative except for: Respiratory: positive for cough   PHYSICAL EXAMINATION: General appearance: alert, cooperative and no distress Head: Normocephalic, without obvious abnormality, atraumatic Neck: no adenopathy, no JVD, supple, symmetrical, trachea midline and thyroid not enlarged, symmetric, no tenderness/mass/nodules Lymph nodes:  Cervical, supraclavicular, and axillary nodes normal. Resp: rales LUL and wheezes bilaterally Back: symmetric, no curvature. ROM normal. No CVA tenderness. Cardio: regular rate and rhythm, S1, S2 normal, no murmur, click, rub or gallop and normal apical impulse GI: soft, non-tender;  bowel sounds normal; no masses,  no organomegaly Extremities: extremities normal, atraumatic, no cyanosis or edema  ECOG PERFORMANCE STATUS: 1 - Symptomatic but completely ambulatory  Blood pressure (!) 122/58, pulse 76, temperature 98.6 F (37 C), temperature source Oral, resp. rate 20, height 5\' 2"  (1.575 m), weight 127 lb 1.6 oz (57.7 kg), SpO2 100 %.  LABORATORY DATA: Lab Results  Component Value Date   WBC 4.1 03/26/2017   HGB 11.9 03/26/2017   HCT 35.5 03/26/2017   MCV 99.5 03/26/2017   PLT 256 03/26/2017      Chemistry      Component Value Date/Time   NA 142 03/05/2017 1053   K 4.1 03/05/2017 1053   CL 108 (H) 11/04/2012 0947   CO2 26 03/05/2017 1053   BUN 14.0 03/05/2017 1053   CREATININE 0.8 03/05/2017 1053      Component Value Date/Time   CALCIUM 9.1 03/05/2017 1053   ALKPHOS 83 03/05/2017 1053   AST 20 03/05/2017 1053   ALT <6 03/05/2017 1053   BILITOT 0.58 03/05/2017 1053       RADIOGRAPHIC STUDIES: No results found.  ASSESSMENT AND PLAN:  This is a very pleasant 83 years old white female with recurrent non-small cell lung cancer, adenocarcinoma status post induction systemic chemotherapy was carboplatin and Alimta with partial response. She is currently on maintenance treatment with single agent Alimta status post 106 cycles. The patient tolerated the last cycle of her treatment with no significant complications. I recommended for her to proceed with cycle #107 today as a scheduled. I would see the patient back for follow-up visit in 3 weeks for evaluation before cycle 108. For the dry cough she will continue on her current cough medication with Tussionex. The patient was advised to call immediately if she has any concerning symptoms in the interval. The patient voices understanding of current disease status and treatment options and is in agreement with the current care plan. All questions were answered. The patient knows to call the clinic with any  problems, questions or concerns. We can certainly see the patient much sooner if necessary.  Disclaimer: This note was dictated with voice recognition software. Similar sounding words can inadvertently be transcribed and may not be corrected upon review.

## 2017-03-26 NOTE — Patient Instructions (Signed)
[facility] Discharge Instructions for Patients Receiving Chemotherapy  Today you received the following chemotherapy agents: Alimta   To help prevent nausea and vomiting after your treatment, we encourage you to take your nausea medication as directed.   If you develop nausea and vomiting that is not controlled by your nausea medication, call the clinic.   BELOW ARE SYMPTOMS THAT SHOULD BE REPORTED IMMEDIATELY:  *FEVER GREATER THAN 100.5 F  *CHILLS WITH OR WITHOUT FEVER  NAUSEA AND VOMITING THAT IS NOT CONTROLLED WITH YOUR NAUSEA MEDICATION  *UNUSUAL SHORTNESS OF BREATH  *UNUSUAL BRUISING OR BLEEDING  TENDERNESS IN MOUTH AND THROAT WITH OR WITHOUT PRESENCE OF ULCERS  *URINARY PROBLEMS  *BOWEL PROBLEMS  UNUSUAL RASH Items with * indicate a potential emergency and should be followed up as soon as possible.  Feel free to call the clinic should you have any questions or concerns. The clinic phone number is [phone].  Please show the [facility] at check-in to the Emergency Department and triage nurse.

## 2017-03-27 ENCOUNTER — Telehealth: Payer: Self-pay | Admitting: Internal Medicine

## 2017-03-27 NOTE — Telephone Encounter (Signed)
Scheduled appt per 10/24 los - patient to get new schedule next visit.

## 2017-04-16 ENCOUNTER — Ambulatory Visit: Payer: Medicare Other | Admitting: Internal Medicine

## 2017-04-16 ENCOUNTER — Ambulatory Visit (HOSPITAL_BASED_OUTPATIENT_CLINIC_OR_DEPARTMENT_OTHER): Payer: Medicare Other

## 2017-04-16 ENCOUNTER — Telehealth: Payer: Self-pay | Admitting: Internal Medicine

## 2017-04-16 ENCOUNTER — Other Ambulatory Visit (HOSPITAL_BASED_OUTPATIENT_CLINIC_OR_DEPARTMENT_OTHER): Payer: Medicare Other

## 2017-04-16 ENCOUNTER — Encounter: Payer: Self-pay | Admitting: Internal Medicine

## 2017-04-16 DIAGNOSIS — C3411 Malignant neoplasm of upper lobe, right bronchus or lung: Secondary | ICD-10-CM

## 2017-04-16 DIAGNOSIS — R05 Cough: Secondary | ICD-10-CM

## 2017-04-16 DIAGNOSIS — C3492 Malignant neoplasm of unspecified part of left bronchus or lung: Principal | ICD-10-CM

## 2017-04-16 DIAGNOSIS — J700 Acute pulmonary manifestations due to radiation: Secondary | ICD-10-CM

## 2017-04-16 DIAGNOSIS — Z5111 Encounter for antineoplastic chemotherapy: Secondary | ICD-10-CM | POA: Diagnosis not present

## 2017-04-16 DIAGNOSIS — R059 Cough, unspecified: Secondary | ICD-10-CM

## 2017-04-16 DIAGNOSIS — C3491 Malignant neoplasm of unspecified part of right bronchus or lung: Secondary | ICD-10-CM

## 2017-04-16 LAB — COMPREHENSIVE METABOLIC PANEL
ALBUMIN: 3.3 g/dL — AB (ref 3.5–5.0)
ALT: 10 U/L (ref 0–55)
ANION GAP: 6 meq/L (ref 3–11)
AST: 23 U/L (ref 5–34)
Alkaline Phosphatase: 73 U/L (ref 40–150)
BUN: 14.6 mg/dL (ref 7.0–26.0)
CALCIUM: 9.1 mg/dL (ref 8.4–10.4)
CO2: 26 meq/L (ref 22–29)
Chloride: 110 mEq/L — ABNORMAL HIGH (ref 98–109)
Creatinine: 0.8 mg/dL (ref 0.6–1.1)
GLUCOSE: 88 mg/dL (ref 70–140)
POTASSIUM: 4 meq/L (ref 3.5–5.1)
Sodium: 142 mEq/L (ref 136–145)
TOTAL PROTEIN: 7.2 g/dL (ref 6.4–8.3)
Total Bilirubin: 0.6 mg/dL (ref 0.20–1.20)

## 2017-04-16 LAB — CBC WITH DIFFERENTIAL/PLATELET
BASO%: 0.5 % (ref 0.0–2.0)
Basophils Absolute: 0 10*3/uL (ref 0.0–0.1)
EOS ABS: 0 10*3/uL (ref 0.0–0.5)
EOS%: 0.8 % (ref 0.0–7.0)
HEMATOCRIT: 37.3 % (ref 34.8–46.6)
HEMOGLOBIN: 12.3 g/dL (ref 11.6–15.9)
LYMPH#: 1.3 10*3/uL (ref 0.9–3.3)
LYMPH%: 34.8 % (ref 14.0–49.7)
MCH: 32.8 pg (ref 25.1–34.0)
MCHC: 33 g/dL (ref 31.5–36.0)
MCV: 99.5 fL (ref 79.5–101.0)
MONO#: 0.4 10*3/uL (ref 0.1–0.9)
MONO%: 10.5 % (ref 0.0–14.0)
NEUT%: 53.4 % (ref 38.4–76.8)
NEUTROS ABS: 2 10*3/uL (ref 1.5–6.5)
PLATELETS: 257 10*3/uL (ref 145–400)
RBC: 3.75 10*6/uL (ref 3.70–5.45)
RDW: 14.9 % — AB (ref 11.2–14.5)
WBC: 3.7 10*3/uL — AB (ref 3.9–10.3)

## 2017-04-16 MED ORDER — CYANOCOBALAMIN 1000 MCG/ML IJ SOLN
1000.0000 ug | Freq: Once | INTRAMUSCULAR | Status: AC
  Administered 2017-04-16: 1000 ug via INTRAMUSCULAR

## 2017-04-16 MED ORDER — SODIUM CHLORIDE 0.9 % IV SOLN
400.0000 mg/m2 | Freq: Once | INTRAVENOUS | Status: AC
  Administered 2017-04-16: 675 mg via INTRAVENOUS
  Filled 2017-04-16: qty 20

## 2017-04-16 MED ORDER — CYANOCOBALAMIN 1000 MCG/ML IJ SOLN
INTRAMUSCULAR | Status: AC
  Filled 2017-04-16: qty 1

## 2017-04-16 MED ORDER — HYDROCOD POLST-CPM POLST ER 10-8 MG/5ML PO SUER: 5.0000 mL | Freq: Two times a day (BID) | ORAL | 0 refills | Status: DC | PRN

## 2017-04-16 MED ORDER — SODIUM CHLORIDE 0.9 % IV SOLN
Freq: Once | INTRAVENOUS | Status: AC
  Administered 2017-04-16: 13:00:00 via INTRAVENOUS
  Filled 2017-04-16: qty 8

## 2017-04-16 MED ORDER — SODIUM CHLORIDE 0.9 % IV SOLN
Freq: Once | INTRAVENOUS | Status: AC
  Administered 2017-04-16: 12:00:00 via INTRAVENOUS

## 2017-04-16 MED ORDER — HEPARIN SOD (PORK) LOCK FLUSH 100 UNIT/ML IV SOLN
500.0000 [IU] | Freq: Once | INTRAVENOUS | Status: AC | PRN
  Administered 2017-04-16: 500 [IU]
  Filled 2017-04-16: qty 5

## 2017-04-16 MED ORDER — SODIUM CHLORIDE 0.9 % IJ SOLN
10.0000 mL | INTRAMUSCULAR | Status: DC | PRN
  Administered 2017-04-16: 10 mL
  Filled 2017-04-16: qty 10

## 2017-04-16 NOTE — Telephone Encounter (Signed)
Gave avs and calendar for current schedule

## 2017-04-16 NOTE — Patient Instructions (Signed)
[facility] Discharge Instructions for Patients Receiving Chemotherapy  Today you received the following chemotherapy agents Alimta  To help prevent nausea and vomiting after your treatment, we encourage you to take your nausea medication as directed   If you develop nausea and vomiting that is not controlled by your nausea medication, call the clinic.   BELOW ARE SYMPTOMS THAT SHOULD BE REPORTED IMMEDIATELY:  *FEVER GREATER THAN 100.5 F  *CHILLS WITH OR WITHOUT FEVER  NAUSEA AND VOMITING THAT IS NOT CONTROLLED WITH YOUR NAUSEA MEDICATION  *UNUSUAL SHORTNESS OF BREATH  *UNUSUAL BRUISING OR BLEEDING  TENDERNESS IN MOUTH AND THROAT WITH OR WITHOUT PRESENCE OF ULCERS  *URINARY PROBLEMS  *BOWEL PROBLEMS  UNUSUAL RASH Items with * indicate a potential emergency and should be followed up as soon as possible.  Feel free to call the clinic should you have any questions or concerns. The clinic phone number is [phone].  Please show the [facility] at check-in to the Emergency Department and triage nurse.

## 2017-04-16 NOTE — Progress Notes (Signed)
[place] [phone]   Fax:(336) [phone]  OFFICE PROGRESS NOTE  [name], MD [place] [address]  DIAGNOSIS: Recurrent non-small cell lung cancer, adenocarcinoma initially diagnosed as stage IB (T2, N0, M0) in June 2008 with tumor size of 8.0 cm.  PRIOR THERAPY: #1 status post left upper lobectomy with lymph node dissection under the care of Dr. Arlyce Dice on 06/29/2006.  #2 status post 4 cycles of adjuvant chemotherapy with cisplatin and Taxotere last dose given 08/01/2006.  #3 status post 6 cycles of systemic chemotherapy with carboplatin and Alimta for disease recurrence last dose given 12/12/2009.  #4 palliative radiotherapy to the enlarging right upper lobe lung mass under the care of Dr. Pablo Ledger completed on 11/14/2015.  CURRENT THERAPY: Maintenance systemic chemotherapy with single agent Alimta 400 MG/M2 every 3 weeks, status post 107 cycles.  INTERVAL HISTORY: [name] 83 y.o. female returns to the clinic today for follow-up visit.  The patient has no complaints today except for the persistent dry cough and she is requesting refill of Tussionex.  She denied having any chest pain, shortness breath or hemoptysis.  She denied having any nausea, vomiting, diarrhea or constipation.  She has no fever or chills.  She continues to tolerate her treatment with maintenance Alimta fairly well.  She is here today for evaluation before starting cycle #108.  MEDICAL HISTORY: Past Medical History:  Diagnosis Date  . FHx: chemotherapy 2008&2011   4 cycles cisplatin,taxotere,2008& carboplatin and Alimta 2011  . History of migraine headaches   . Hypercholesterolemia   . lung ca dx'd 06/2006   rt and lt lung  . Lung cancer ([facility]) 3/14/1   right- Adenocarcinoma w/bronchioalveolar features  . Radiation 11/01/15-11/14/15   right upper lobe 30 gray  . Radiation pneumonitis ([facility]) 01/24/2016    ALLERGIES:  is allergic to  simvastatin.  MEDICATIONS:  Current Outpatient Medications  Medication Sig Dispense Refill  . albuterol (PROVENTIL HFA;VENTOLIN HFA) 108 (90 Base) MCG/ACT inhaler Inhale 1-2 puffs into the lungs every 6 (six) hours as needed for wheezing or shortness of breath. 1 Inhaler 2  . Calcium Carbonate-Vit D-Min (CALTRATE PLUS PO) Take 500 mg by mouth 2 (two) times daily. Reported on 12/13/2015    . chlorpheniramine-HYDROcodone (TUSSIONEX) 10-8 MG/5ML SUER Take 5 mLs by mouth 2 (two) times daily as needed for cough. 102 mL 0  . folic acid (FOLVITE) 1 MG tablet Take 1 mg by mouth daily. Reported on 12/13/2015    . Multiple Vitamins-Minerals (CENTRUM SILVER PO) Take 1 tablet by mouth daily. Reported on 12/13/2015    . vitamin B-12 (CYANOCOBALAMIN) 500 MCG tablet Take 1,500 mcg by mouth daily. Reported on 12/13/2015    . prochlorperazine (COMPAZINE) 10 MG tablet Take 1 tablet (10 mg total) by mouth every 6 (six) hours as needed. (Patient not taking: Reported on 04/16/2017) 15 tablet 1   No current facility-administered medications for this visit.     SURGICAL HISTORY:  Past Surgical History:  Procedure Laterality Date  . CATARACT EXTRACTION  2013   bilateral  . left  lowerlung lobectomy Left 06/19/2006   Dr.Burney,Left lower lobectomy  . Porta-cath  2011    REVIEW OF SYSTEMS:  A comprehensive review of systems was negative except for: Respiratory: positive for cough   PHYSICAL EXAMINATION: General appearance: alert, cooperative and no distress Head: Normocephalic, without obvious abnormality, atraumatic Neck: no adenopathy, no JVD, supple, symmetrical, trachea midline and thyroid not enlarged, symmetric,  no tenderness/mass/nodules Lymph nodes: Cervical, supraclavicular, and axillary nodes normal. Resp: rales LUL and wheezes bilaterally Back: symmetric, no curvature. ROM normal. No CVA tenderness. Cardio: regular rate and rhythm, S1, S2 normal, no murmur, click, rub or gallop and normal apical  impulse GI: soft, non-tender; bowel sounds normal; no masses,  no organomegaly Extremities: extremities normal, atraumatic, no cyanosis or edema  ECOG PERFORMANCE STATUS: 1 - Symptomatic but completely ambulatory  Blood pressure 136/65, pulse 80, temperature 98.7 F (37.1 C), temperature source Oral, resp. rate 18, height 5\' 2"  (1.575 m), weight 126 lb 3.2 oz (57.2 kg), SpO2 100 %.  LABORATORY DATA: Lab Results  Component Value Date   WBC 3.7 (L) 04/16/2017   HGB 12.3 04/16/2017   HCT 37.3 04/16/2017   MCV 99.5 04/16/2017   PLT 257 04/16/2017      Chemistry      Component Value Date/Time   NA 140 03/26/2017 1215   K 4.2 03/26/2017 1215   CL 108 (H) 11/04/2012 0947   CO2 26 03/26/2017 1215   BUN 17.2 03/26/2017 1215   CREATININE 0.8 03/26/2017 1215      Component Value Date/Time   CALCIUM 8.8 03/26/2017 1215   ALKPHOS 71 03/26/2017 1215   AST 20 03/26/2017 1215   ALT 8 03/26/2017 1215   BILITOT 0.42 03/26/2017 1215       RADIOGRAPHIC STUDIES: No results found.  ASSESSMENT AND PLAN:  This is a very pleasant 83 years old white female with recurrent non-small cell lung cancer, adenocarcinoma status post induction systemic chemotherapy was carboplatin and Alimta with partial response. She is currently on maintenance treatment with single agent Alimta status post 107 cycles. The patient continues to tolerate this treatment well with no significant adverse effects. I recommended for her to proceed with cycle #108 today. I will see her back for follow-up visit in 3 weeks for evaluation before the next dose of her treatment. The patient was advised to call immediately if she has any concerning symptoms in the interval. For the dry cough she will continue on her current cough medication with Tussionex. The patient voices understanding of current disease status and treatment options and is in agreement with the current care plan. All questions were answered. The patient knows  to call the clinic with any problems, questions or concerns. We can certainly see the patient much sooner if necessary.  Disclaimer: This note was dictated with voice recognition software. Similar sounding words can inadvertently be transcribed and may not be corrected upon review.

## 2017-05-07 ENCOUNTER — Other Ambulatory Visit (HOSPITAL_BASED_OUTPATIENT_CLINIC_OR_DEPARTMENT_OTHER): Payer: Medicare Other

## 2017-05-07 ENCOUNTER — Other Ambulatory Visit: Payer: Self-pay | Admitting: *Deleted

## 2017-05-07 ENCOUNTER — Ambulatory Visit (HOSPITAL_BASED_OUTPATIENT_CLINIC_OR_DEPARTMENT_OTHER): Payer: Medicare Other

## 2017-05-07 ENCOUNTER — Ambulatory Visit: Payer: Medicare Other | Admitting: Oncology

## 2017-05-07 ENCOUNTER — Encounter: Payer: Self-pay | Admitting: Oncology

## 2017-05-07 VITALS — BP 139/68 | HR 89 | Temp 98.3°F | Resp 18 | Ht 62.0 in | Wt 126.8 lb

## 2017-05-07 DIAGNOSIS — C3491 Malignant neoplasm of unspecified part of right bronchus or lung: Secondary | ICD-10-CM

## 2017-05-07 DIAGNOSIS — J40 Bronchitis, not specified as acute or chronic: Secondary | ICD-10-CM

## 2017-05-07 DIAGNOSIS — C3411 Malignant neoplasm of upper lobe, right bronchus or lung: Secondary | ICD-10-CM

## 2017-05-07 DIAGNOSIS — C3492 Malignant neoplasm of unspecified part of left bronchus or lung: Principal | ICD-10-CM

## 2017-05-07 DIAGNOSIS — Z5111 Encounter for antineoplastic chemotherapy: Secondary | ICD-10-CM

## 2017-05-07 DIAGNOSIS — C3412 Malignant neoplasm of upper lobe, left bronchus or lung: Secondary | ICD-10-CM

## 2017-05-07 LAB — COMPREHENSIVE METABOLIC PANEL
ALBUMIN: 3.3 g/dL — AB (ref 3.5–5.0)
ALK PHOS: 80 U/L (ref 40–150)
ALT: 8 U/L (ref 0–55)
ANION GAP: 8 meq/L (ref 3–11)
AST: 19 U/L (ref 5–34)
BILIRUBIN TOTAL: 0.49 mg/dL (ref 0.20–1.20)
BUN: 12.7 mg/dL (ref 7.0–26.0)
CALCIUM: 9.1 mg/dL (ref 8.4–10.4)
CHLORIDE: 109 meq/L (ref 98–109)
CO2: 24 mEq/L (ref 22–29)
CREATININE: 0.9 mg/dL (ref 0.6–1.1)
EGFR: >60 mL/min/{1.73_m2} (ref >60)
Glucose: 81 mg/dl (ref 70–140)
Potassium: 4.1 mEq/L (ref 3.5–5.1)
Sodium: 141 mEq/L (ref 136–145)
TOTAL PROTEIN: 7 g/dL (ref 6.4–8.3)

## 2017-05-07 LAB — CBC WITH DIFFERENTIAL/PLATELET
BASO%: 0.3 % (ref 0.0–2.0)
BASOS ABS: 0 10*3/uL (ref 0.0–0.1)
EOS ABS: 0 10*3/uL (ref 0.0–0.5)
EOS%: 0.6 % (ref 0.0–7.0)
HCT: 38.7 % (ref 34.8–46.6)
HEMOGLOBIN: 12.4 g/dL (ref 11.6–15.9)
LYMPH%: 35.3 % (ref 14.0–49.7)
MCH: 32.4 pg (ref 25.1–34.0)
MCHC: 32 g/dL (ref 31.5–36.0)
MCV: 101 fL (ref 79.5–101.0)
MONO#: 0.4 10*3/uL (ref 0.1–0.9)
MONO%: 9.9 % (ref 0.0–14.0)
NEUT%: 53.9 % (ref 38.4–76.8)
NEUTROS ABS: 1.9 10*3/uL (ref 1.5–6.5)
PLATELETS: 262 10*3/uL (ref 145–400)
RBC: 3.83 10*6/uL (ref 3.70–5.45)
RDW: 15.1 % — AB (ref 11.2–14.5)
WBC: 3.5 10*3/uL — AB (ref 3.9–10.3)
lymph#: 1.3 10*3/uL (ref 0.9–3.3)

## 2017-05-07 MED ORDER — SODIUM CHLORIDE 0.9 % IJ SOLN
10.0000 mL | INTRAMUSCULAR | Status: DC | PRN
  Administered 2017-05-07: 10 mL
  Filled 2017-05-07: qty 10

## 2017-05-07 MED ORDER — HEPARIN SOD (PORK) LOCK FLUSH 100 UNIT/ML IV SOLN
500.0000 [IU] | Freq: Once | INTRAVENOUS | Status: AC | PRN
  Administered 2017-05-07: 500 [IU]
  Filled 2017-05-07: qty 5

## 2017-05-07 MED ORDER — SODIUM CHLORIDE 0.9 % IV SOLN
400.0000 mg/m2 | Freq: Once | INTRAVENOUS | Status: AC
  Administered 2017-05-07: 675 mg via INTRAVENOUS
  Filled 2017-05-07: qty 20

## 2017-05-07 MED ORDER — DEXAMETHASONE SODIUM PHOSPHATE 100 MG/10ML IJ SOLN
Freq: Once | INTRAMUSCULAR | Status: AC
  Administered 2017-05-07: 12:00:00 via INTRAVENOUS
  Filled 2017-05-07: qty 8

## 2017-05-07 MED ORDER — ALBUTEROL SULFATE HFA 108 (90 BASE) MCG/ACT IN AERS: 1.0000 | INHALATION_SPRAY | Freq: Four times a day (QID) | RESPIRATORY_TRACT | 2 refills | Status: DC | PRN

## 2017-05-07 MED ORDER — SODIUM CHLORIDE 0.9 % IV SOLN
Freq: Once | INTRAVENOUS | Status: AC
  Administered 2017-05-07: 12:00:00 via INTRAVENOUS

## 2017-05-07 MED FILL — PROAIR HFA 90 MCG INHALER: 108 (90 BAS | 16 days supply | Qty: 9 | Fill #0

## 2017-05-07 NOTE — Progress Notes (Signed)
[facility] OFFICE PROGRESS NOTE  [name], [place] [address]  DIAGNOSIS: Recurrent non-small cell lung cancer, adenocarcinoma initially diagnosed as stage IB (T2, N0, M0) in June 2008 with tumor size of 8.0 cm.  PRIOR THERAPY: #1 status post left upper lobectomy with lymph node dissection under the care of Dr. Arlyce Dice on 06/29/2006.  #2 status post 4 cycles of adjuvant chemotherapy with cisplatin and Taxotere last dose given 08/01/2006.  #3 status post 6 cycles of systemic chemotherapy with carboplatin and Alimta for disease recurrence last dose given 12/12/2009. #4 palliative radiotherapy to the enlarging right upper lobe lung mass under the care of Dr. Pablo Ledger completed on 11/14/2015.  CURRENT THERAPY: Maintenance systemic chemotherapy with single agent Alimta 400 MG/M2 every 3 weeks, status post 108 cycles.  INTERVAL HISTORY: [name] 83 y.o. female returns for routine follow-up visit by herself. The patient has no complaints today except for the persistent dry cough and she is requesting refill of her albuterol inhaler.  She denied having any chest pain, shortness breath or hemoptysis.  She denied having any nausea, vomiting, diarrhea or constipation.  She has no fever or chills.  She continues to tolerate her treatment with maintenance Alimta fairly well.  She is here today for evaluation before starting cycle #109.  MEDICAL HISTORY: Past Medical History:  Diagnosis Date  . FHx: chemotherapy 2008&2011   4 cycles cisplatin,taxotere,2008& carboplatin and Alimta 2011  . History of migraine headaches   . Hypercholesterolemia   . lung ca dx'd 06/2006   rt and lt lung  . Lung cancer ([facility]) 3/14/1   right- Adenocarcinoma w/bronchioalveolar features  . Radiation 11/01/15-11/14/15   right upper lobe 30 gray  . Radiation pneumonitis ([facility]) 01/24/2016    ALLERGIES:  is allergic to simvastatin.  MEDICATIONS:  Current Outpatient  Medications  Medication Sig Dispense Refill  . Calcium Carbonate-Vit D-Min (CALTRATE PLUS PO) Take 500 mg by mouth 2 (two) times daily. Reported on 1/91/4782    . folic acid (FOLVITE) 1 MG tablet Take 1 mg by mouth daily. Reported on 12/13/2015    . Multiple Vitamins-Minerals (CENTRUM SILVER PO) Take 1 tablet by mouth daily. Reported on 12/13/2015    . vitamin B-12 (CYANOCOBALAMIN) 500 MCG tablet Take 1,500 mcg by mouth daily. Reported on 12/13/2015    . albuterol (PROVENTIL HFA;VENTOLIN HFA) 108 (90 Base) MCG/ACT inhaler Inhale 1-2 puffs into the lungs every 6 (six) hours as needed for wheezing or shortness of breath. 1 Inhaler 2  . chlorpheniramine-HYDROcodone (TUSSIONEX) 10-8 MG/5ML SUER Take 5 mLs 2 (two) times daily as needed by mouth for cough. 140 mL 0  . prochlorperazine (COMPAZINE) 10 MG tablet Take 1 tablet (10 mg total) by mouth every 6 (six) hours as needed. (Patient not taking: Reported on 04/16/2017) 15 tablet 1   No current facility-administered medications for this visit.     SURGICAL HISTORY:  Past Surgical History:  Procedure Laterality Date  . CATARACT EXTRACTION  2013   bilateral  . left  lowerlung lobectomy Left 06/19/2006   Dr.Burney,Left lower lobectomy  . Porta-cath  2011    REVIEW OF SYSTEMS:   Review of Systems  Constitutional: Negative for appetite change, chills, fatigue, fever and unexpected weight change.  HENT:   Negative for mouth sores, nosebleeds, sore throat and trouble swallowing.   Eyes: Negative for eye problems and icterus.  Respiratory: Negative for hemoptysis, shortness of breath and wheezing.  Positive for cough.  Cardiovascular:  Negative for chest pain and leg swelling.  Gastrointestinal: Negative for abdominal pain, constipation, diarrhea, nausea and vomiting.  Genitourinary: Negative for bladder incontinence, difficulty urinating, dysuria, frequency and hematuria.   Musculoskeletal: Negative for back pain, gait problem, neck pain and neck  stiffness.  Skin: Negative for itching and rash.  Neurological: Negative for dizziness, extremity weakness, gait problem, headaches, light-headedness and seizures.  Hematological: Negative for adenopathy. Does not bruise/bleed easily.  Psychiatric/Behavioral: Negative for confusion, depression and sleep disturbance. The patient is not nervous/anxious.     PHYSICAL EXAMINATION:  Blood pressure 139/68, pulse 89, temperature 98.3 F (36.8 C), temperature source Oral, resp. rate 18, height 5\' 2"  (1.575 m), weight 126 lb 12.8 oz (57.5 kg), SpO2 100 %.  ECOG PERFORMANCE STATUS: 1 - Symptomatic but completely ambulatory  Physical Exam  Constitutional: Oriented to person, place, and time and well-developed, well-nourished, and in no distress. No distress.  HENT:  Head: Normocephalic and atraumatic.  Mouth/Throat: Oropharynx is clear and moist. No oropharyngeal exudate.  Eyes: Conjunctivae are normal. Right eye exhibits no discharge. Left eye exhibits no discharge. No scleral icterus.  Neck: Normal range of motion. Neck supple.  Cardiovascular: Normal rate, regular rhythm, normal heart sounds and intact distal pulses.   Pulmonary/Chest: Effort normal and breath sounds normal. No respiratory distress. No wheezes. No rales.  Abdominal: Soft. Bowel sounds are normal. Exhibits no distension and no mass. There is no tenderness.  Musculoskeletal: Normal range of motion. Exhibits no edema.  Lymphadenopathy:    No cervical adenopathy.  Neurological: Alert and oriented to person, place, and time. Exhibits normal muscle tone. Gait normal. Coordination normal.  Skin: Skin is warm and dry. No rash noted. Not diaphoretic. No erythema. No pallor.  Psychiatric: Mood, memory and judgment normal.  Vitals reviewed.  LABORATORY DATA: Lab Results  Component Value Date   WBC 3.5 (L) 05/07/2017   HGB 12.4 05/07/2017   HCT 38.7 05/07/2017   MCV 101.0 05/07/2017   PLT 262 05/07/2017      Chemistry       Component Value Date/Time   NA 141 05/07/2017 1039   K 4.1 05/07/2017 1039   CL 108 (H) 11/04/2012 0947   CO2 24 05/07/2017 1039   BUN 12.7 05/07/2017 1039   CREATININE 0.9 05/07/2017 1039      Component Value Date/Time   CALCIUM 9.1 05/07/2017 1039   ALKPHOS 80 05/07/2017 1039   AST 19 05/07/2017 1039   ALT 8 05/07/2017 1039   BILITOT 0.49 05/07/2017 1039       RADIOGRAPHIC STUDIES:  No results found.   ASSESSMENT/PLAN:  Bilateral lung cancer [facility]) This is a very pleasant 83 year old white female with recurrent non-small cell lung cancer, adenocarcinoma status post induction systemic chemotherapy was carboplatin and Alimta with partial response. She is currently on maintenance treatment with single agent Alimta status post 108 cycles. The patient continues to tolerate this treatment well with no significant adverse effects. I recommended for her to proceed with cycle #109 today. The patient will have a restaging CT scan of the chest prior to her next visit. Follow-up visit will be in 3 weeks for evaluation prior to her next cycle of chemotherapy.  Albuterol inhaler was refilled for her today.  The patient was advised to call immediately if she has any concerning symptoms in the interval. The patient voices understanding of current disease status and treatment options and is in agreement with the current care plan. All questions were answered. The patient  knows to call the clinic with any problems, questions or concerns. We can certainly see the patient much sooner if necessary.  Orders Placed This Encounter  Procedures  . CT CHEST W CONTRAST    Standing Status:   Future    Standing Expiration Date:   05/07/2018    Order Specific Question:   If indicated for the ordered procedure, I authorize the administration of contrast media per Radiology protocol    Answer:   Yes    Order Specific Question:   Preferred imaging location?    Answer:   [facility]    Order  Specific Question:   Radiology Contrast Protocol - do NOT remove file path    Answer:   [email or web address]    Order Specific Question:   Reason for Exam additional comments    Answer:   lung cancer. restaging.  Marland Kitchen CBC with Differential/Platelet    Standing Status:   Standing    Number of Occurrences:   10    Standing Expiration Date:   05/07/2018  . Comprehensive metabolic panel    Standing Status:   Standing    Number of Occurrences:   10    Standing Expiration Date:   05/07/2018    Mikey Bussing, DNP, AGPCNP-BC, AOCNP 05/07/17

## 2017-05-07 NOTE — Assessment & Plan Note (Signed)
This is a very pleasant 83 year old white female with recurrent non-small cell lung cancer, adenocarcinoma status post induction systemic chemotherapy was carboplatin and Alimta with partial response. She is currently on maintenance treatment with single agent Alimta status post 108 cycles. The patient continues to tolerate this treatment well with no significant adverse effects. I recommended for her to proceed with cycle #109 today. The patient will have a restaging CT scan of the chest prior to her next visit. Follow-up visit will be in 3 weeks for evaluation prior to her next cycle of chemotherapy.  Albuterol inhaler was refilled for her today.  The patient was advised to call immediately if she has any concerning symptoms in the interval. The patient voices understanding of current disease status and treatment options and is in agreement with the current care plan. All questions were answered. The patient knows to call the clinic with any problems, questions or concerns. We can certainly see the patient much sooner if necessary.

## 2017-05-07 NOTE — Patient Instructions (Signed)
[facility] Discharge Instructions for Patients Receiving Chemotherapy  Today you received the following chemotherapy agents: Pemetrexed (Alimta). To help prevent nausea and vomiting after your treatment, we encourage you to take your nausea medication as prescribed.  If you develop nausea and vomiting that is not controlled by your nausea medication, call the clinic.   BELOW ARE SYMPTOMS THAT SHOULD BE REPORTED IMMEDIATELY:  *FEVER GREATER THAN 100.5 F  *CHILLS WITH OR WITHOUT FEVER  NAUSEA AND VOMITING THAT IS NOT CONTROLLED WITH YOUR NAUSEA MEDICATION  *UNUSUAL SHORTNESS OF BREATH  *UNUSUAL BRUISING OR BLEEDING  TENDERNESS IN MOUTH AND THROAT WITH OR WITHOUT PRESENCE OF ULCERS  *URINARY PROBLEMS  *BOWEL PROBLEMS  UNUSUAL RASH Items with * indicate a potential emergency and should be followed up as soon as possible.  Feel free to call the clinic should you have any questions or concerns. The clinic phone number is [phone].  Please show the [facility] at check-in to the Emergency Department and triage nurse.

## 2017-05-09 ENCOUNTER — Other Ambulatory Visit: Payer: Self-pay | Admitting: Oncology

## 2017-05-09 DIAGNOSIS — C3491 Malignant neoplasm of unspecified part of right bronchus or lung: Secondary | ICD-10-CM

## 2017-05-09 DIAGNOSIS — C3492 Malignant neoplasm of unspecified part of left bronchus or lung: Principal | ICD-10-CM

## 2017-05-23 ENCOUNTER — Ambulatory Visit (HOSPITAL_COMMUNITY)
Admission: RE | Admit: 2017-05-23 | Discharge: 2017-05-23 | Disposition: A | Discharge status: Home / Self Care | Payer: Medicare Other | Source: Ambulatory Visit | Attending: Oncology | Admitting: Oncology

## 2017-05-23 DIAGNOSIS — C3492 Malignant neoplasm of unspecified part of left bronchus or lung: Secondary | ICD-10-CM | POA: Insufficient documentation

## 2017-05-23 DIAGNOSIS — C3491 Malignant neoplasm of unspecified part of right bronchus or lung: Secondary | ICD-10-CM | POA: Insufficient documentation

## 2017-05-26 ENCOUNTER — Other Ambulatory Visit: Payer: Self-pay | Admitting: *Deleted

## 2017-05-28 ENCOUNTER — Telehealth: Payer: Self-pay | Admitting: Internal Medicine

## 2017-05-28 ENCOUNTER — Other Ambulatory Visit (HOSPITAL_BASED_OUTPATIENT_CLINIC_OR_DEPARTMENT_OTHER): Payer: Medicare Other

## 2017-05-28 ENCOUNTER — Ambulatory Visit: Payer: Medicare Other | Admitting: Internal Medicine

## 2017-05-28 ENCOUNTER — Ambulatory Visit (HOSPITAL_BASED_OUTPATIENT_CLINIC_OR_DEPARTMENT_OTHER): Payer: Medicare Other

## 2017-05-28 ENCOUNTER — Encounter: Payer: Self-pay | Admitting: Internal Medicine

## 2017-05-28 VITALS — BP 122/70 | HR 85 | Temp 97.8°F | Resp 18 | Ht 62.0 in | Wt 128.0 lb

## 2017-05-28 DIAGNOSIS — C3492 Malignant neoplasm of unspecified part of left bronchus or lung: Principal | ICD-10-CM

## 2017-05-28 DIAGNOSIS — Z5111 Encounter for antineoplastic chemotherapy: Secondary | ICD-10-CM

## 2017-05-28 DIAGNOSIS — C3491 Malignant neoplasm of unspecified part of right bronchus or lung: Secondary | ICD-10-CM

## 2017-05-28 DIAGNOSIS — C3411 Malignant neoplasm of upper lobe, right bronchus or lung: Secondary | ICD-10-CM | POA: Diagnosis not present

## 2017-05-28 DIAGNOSIS — R05 Cough: Secondary | ICD-10-CM

## 2017-05-28 DIAGNOSIS — R059 Cough, unspecified: Secondary | ICD-10-CM

## 2017-05-28 DIAGNOSIS — J700 Acute pulmonary manifestations due to radiation: Secondary | ICD-10-CM

## 2017-05-28 LAB — CBC WITH DIFFERENTIAL/PLATELET
BASO%: 0.5 % (ref 0.0–2.0)
Basophils Absolute: 0 10*3/uL (ref 0.0–0.1)
EOS ABS: 0.1 10*3/uL (ref 0.0–0.5)
EOS%: 1.7 % (ref 0.0–7.0)
HCT: 37.4 % (ref 34.8–46.6)
HEMOGLOBIN: 12.1 g/dL (ref 11.6–15.9)
LYMPH%: 38.3 % (ref 14.0–49.7)
MCH: 32.7 pg (ref 25.1–34.0)
MCHC: 32.4 g/dL (ref 31.5–36.0)
MCV: 101.1 fL — AB (ref 79.5–101.0)
MONO#: 0.4 10*3/uL (ref 0.1–0.9)
MONO%: 10.2 % (ref 0.0–14.0)
NEUT%: 49.3 % (ref 38.4–76.8)
NEUTROS ABS: 2.1 10*3/uL (ref 1.5–6.5)
Platelets: 280 10*3/uL (ref 145–400)
RBC: 3.7 10*6/uL (ref 3.70–5.45)
RDW: 15 % — AB (ref 11.2–14.5)
WBC: 4.2 10*3/uL (ref 3.9–10.3)
lymph#: 1.6 10*3/uL (ref 0.9–3.3)

## 2017-05-28 LAB — COMPREHENSIVE METABOLIC PANEL
ALBUMIN: 3.3 g/dL — AB (ref 3.5–5.0)
ALK PHOS: 84 U/L (ref 40–150)
ALT: 7 U/L (ref 0–55)
ANION GAP: 9 meq/L (ref 3–11)
AST: 19 U/L (ref 5–34)
BUN: 14.2 mg/dL (ref 7.0–26.0)
CALCIUM: 9 mg/dL (ref 8.4–10.4)
CHLORIDE: 109 meq/L (ref 98–109)
CO2: 23 mEq/L (ref 22–29)
CREATININE: 0.9 mg/dL (ref 0.6–1.1)
EGFR: >60 mL/min/{1.73_m2} (ref >60)
Glucose: 67 mg/dl — ABNORMAL LOW (ref 70–140)
POTASSIUM: 4.2 meq/L (ref 3.5–5.1)
Sodium: 142 mEq/L (ref 136–145)
Total Bilirubin: 0.54 mg/dL (ref 0.20–1.20)
Total Protein: 7.1 g/dL (ref 6.4–8.3)

## 2017-05-28 MED ORDER — SODIUM CHLORIDE 0.9 % IJ SOLN
10.0000 mL | INTRAMUSCULAR | Status: DC | PRN
  Administered 2017-05-28: 10 mL
  Filled 2017-05-28: qty 10

## 2017-05-28 MED ORDER — HEPARIN SOD (PORK) LOCK FLUSH 100 UNIT/ML IV SOLN
500.0000 [IU] | Freq: Once | INTRAVENOUS | Status: AC | PRN
  Administered 2017-05-28: 500 [IU]
  Filled 2017-05-28: qty 5

## 2017-05-28 MED ORDER — SODIUM CHLORIDE 0.9 % IV SOLN
Freq: Once | INTRAVENOUS | Status: AC
  Administered 2017-05-28: 12:00:00 via INTRAVENOUS

## 2017-05-28 MED ORDER — SODIUM CHLORIDE 0.9 % IV SOLN
Freq: Once | INTRAVENOUS | Status: AC
  Administered 2017-05-28: 12:00:00 via INTRAVENOUS
  Filled 2017-05-28: qty 8

## 2017-05-28 MED ORDER — SODIUM CHLORIDE 0.9 % IV SOLN
400.0000 mg/m2 | Freq: Once | INTRAVENOUS | Status: AC
  Administered 2017-05-28: 675 mg via INTRAVENOUS
  Filled 2017-05-28: qty 19

## 2017-05-28 MED ORDER — HYDROCOD POLST-CPM POLST ER 10-8 MG/5ML PO SUER: 5.0000 mL | Freq: Two times a day (BID) | ORAL | 0 refills | Status: DC | PRN

## 2017-05-28 MED FILL — HYDROCODONE-CHLORPHENIRAM S: 10-8 | 14 days supply | Qty: 140 | Fill #0

## 2017-05-28 NOTE — Patient Instructions (Signed)
[facility] Discharge Instructions for Patients Receiving Chemotherapy  Today you received the following chemotherapy agents :  Alimta.  To help prevent nausea and vomiting after your treatment, we encourage you to take your nausea medication as prescribed.   If you develop nausea and vomiting that is not controlled by your nausea medication, call the clinic.   BELOW ARE SYMPTOMS THAT SHOULD BE REPORTED IMMEDIATELY:  *FEVER GREATER THAN 100.5 F  *CHILLS WITH OR WITHOUT FEVER  NAUSEA AND VOMITING THAT IS NOT CONTROLLED WITH YOUR NAUSEA MEDICATION  *UNUSUAL SHORTNESS OF BREATH  *UNUSUAL BRUISING OR BLEEDING  TENDERNESS IN MOUTH AND THROAT WITH OR WITHOUT PRESENCE OF ULCERS  *URINARY PROBLEMS  *BOWEL PROBLEMS  UNUSUAL RASH Items with * indicate a potential emergency and should be followed up as soon as possible.  Feel free to call the clinic should you have any questions or concerns. The clinic phone number is [phone].  Please show the [facility] at check-in to the Emergency Department and triage nurse.

## 2017-05-28 NOTE — Telephone Encounter (Signed)
Gave avs and calendar for January - march 2019

## 2017-05-28 NOTE — Progress Notes (Signed)
[place] Telephone:(336) [phone]   [phone]  OFFICE PROGRESS NOTE  [name], MD [place] [address]  DIAGNOSIS: Recurrent non-small cell lung cancer, adenocarcinoma initially diagnosed as stage IB (T2, N0, M0) in June 2008 with tumor size of 8.0 cm.  PRIOR THERAPY: #1 status post left upper lobectomy with lymph node dissection under the care of Dr. Arlyce Dice on 06/29/2006.  #2 status post 4 cycles of adjuvant chemotherapy with cisplatin and Taxotere last dose given 08/01/2006.  #3 status post 6 cycles of systemic chemotherapy with carboplatin and Alimta for disease recurrence last dose given 12/12/2009.  #4 palliative radiotherapy to the enlarging right upper lobe lung mass under the care of Dr. Pablo Ledger completed on 11/14/2015.  CURRENT THERAPY: Maintenance systemic chemotherapy with single agent Alimta 400 MG/M2 every 3 weeks, status post 108 cycles.  INTERVAL HISTORY: [name] 83 y.o. female returns to the clinic today for follow-up visit.  The patient is feeling fine today with no specific complaints except for mild shortness of breath and dry cough.  She is requesting refill of Tussionex.  The patient enjoyed her Christmas holiday with her family.  She denied having any current chest pain or hemoptysis.  She has no weight loss or night sweats.  She has no nausea, vomiting, diarrhea or constipation.  She denied having any headache or visual changes.  She had a repeat CT scan of the chest performed recently and she is here for evaluation and discussion of her scan results.  MEDICAL HISTORY: Past Medical History:  Diagnosis Date  . FHx: chemotherapy 2008&2011   4 cycles cisplatin,taxotere,2008& carboplatin and Alimta 2011  . History of migraine headaches   . Hypercholesterolemia   . lung ca dx'd 06/2006   rt and lt lung  . Lung cancer ([place]) 3/14/1   right- Adenocarcinoma w/bronchioalveolar features  . Radiation  11/01/15-11/14/15   right upper lobe 30 gray  . Radiation pneumonitis ([place]) 01/24/2016    ALLERGIES:  is allergic to simvastatin.  MEDICATIONS:  Current Outpatient Medications  Medication Sig Dispense Refill  . albuterol (PROVENTIL HFA;VENTOLIN HFA) 108 (90 Base) MCG/ACT inhaler Inhale 1-2 puffs into the lungs every 6 (six) hours as needed for wheezing or shortness of breath. 1 Inhaler 2  . Calcium Carbonate-Vit D-Min (CALTRATE PLUS PO) Take 500 mg by mouth 2 (two) times daily. Reported on 12/13/2015    . chlorpheniramine-HYDROcodone (TUSSIONEX) 10-8 MG/5ML SUER Take 5 mLs 2 (two) times daily as needed by mouth for cough. 701 mL 0  . folic acid (FOLVITE) 1 MG tablet Take 1 mg by mouth daily. Reported on 12/13/2015    . Multiple Vitamins-Minerals (CENTRUM SILVER PO) Take 1 tablet by mouth daily. Reported on 12/13/2015    . vitamin B-12 (CYANOCOBALAMIN) 500 MCG tablet Take 1,500 mcg by mouth daily. Reported on 12/13/2015    . prochlorperazine (COMPAZINE) 10 MG tablet Take 1 tablet (10 mg total) by mouth every 6 (six) hours as needed. (Patient not taking: Reported on 04/16/2017) 15 tablet 1   No current facility-administered medications for this visit.     SURGICAL HISTORY:  Past Surgical History:  Procedure Laterality Date  . CATARACT EXTRACTION  2013   bilateral  . left  lowerlung lobectomy Left 06/19/2006   Dr.Burney,Left lower lobectomy  . Porta-cath  2011    REVIEW OF SYSTEMS:  Constitutional: positive for fatigue Eyes: negative Ears, nose, mouth, throat, and face: negative Respiratory: positive for cough  and dyspnea on exertion Cardiovascular: negative Gastrointestinal: negative Genitourinary:negative Integument/breast: negative Hematologic/lymphatic: negative Musculoskeletal:negative Neurological: negative Behavioral/Psych: negative Endocrine: negative Allergic/Immunologic: negative   PHYSICAL EXAMINATION: General appearance: alert, cooperative and no distress Head:  Normocephalic, without obvious abnormality, atraumatic Neck: no adenopathy, no JVD, supple, symmetrical, trachea midline and thyroid not enlarged, symmetric, no tenderness/mass/nodules Lymph nodes: Cervical, supraclavicular, and axillary nodes normal. Resp: rales LUL and wheezes bilaterally Back: symmetric, no curvature. ROM normal. No CVA tenderness. Cardio: regular rate and rhythm, S1, S2 normal, no murmur, click, rub or gallop and normal apical impulse GI: soft, non-tender; bowel sounds normal; no masses,  no organomegaly Extremities: extremities normal, atraumatic, no cyanosis or edema Neurologic: Alert and oriented X 3, normal strength and tone. Normal symmetric reflexes. Normal coordination and gait  ECOG PERFORMANCE STATUS: 1 - Symptomatic but completely ambulatory  Blood pressure 122/70, pulse 85, temperature 97.8 F (36.6 C), temperature source Oral, resp. rate 18, height 5\' 2"  (1.575 m), weight 128 lb (58.1 kg), SpO2 100 %.  LABORATORY DATA: Lab Results  Component Value Date   WBC 4.2 05/28/2017   HGB 12.1 05/28/2017   HCT 37.4 05/28/2017   MCV 101.1 (H) 05/28/2017   PLT 280 05/28/2017      Chemistry      Component Value Date/Time   NA 142 05/28/2017 1015   K 4.2 05/28/2017 1015   CL 108 (H) 11/04/2012 0947   CO2 23 05/28/2017 1015   BUN 14.2 05/28/2017 1015   CREATININE 0.9 05/28/2017 1015      Component Value Date/Time   CALCIUM 9.0 05/28/2017 1015   ALKPHOS 84 05/28/2017 1015   AST 19 05/28/2017 1015   ALT 7 05/28/2017 1015   BILITOT 0.54 05/28/2017 1015       RADIOGRAPHIC STUDIES: Ct Chest Wo Contrast  Result Date: 05/23/2017 CLINICAL DATA:  Recurrent non-small cell lung cancer (adenocarcinoma) initially diagnosed stage IB in June 2008. Subsequent recurrence with systemic chemotherapy in 2011 and palliative radiotherapy completed 11/14/2015. EXAM: CT CHEST WITHOUT CONTRAST TECHNIQUE: Multidetector CT imaging of the chest was performed following the  standard protocol without IV contrast. COMPARISON:  Chest CT 01/20/2017 and 10/29/2016. FINDINGS: Cardiovascular: Similar atherosclerosis of the aorta, great vessels and coronary arteries. The ascending aorta measures 3.9 cm in diameter. Right IJ Port-A-Cath extends to the lower SVC. The heart size is normal. There is no pericardial effusion. Mediastinum/Nodes: Stable soft tissue fullness around the right hilum without discrete adenopathy. No mediastinal or axillary lymphadenopathy. The thyroid gland, trachea and esophagus demonstrate no significant findings. Lungs/Pleura: There is no pleural effusion. Remote left lower lobe resection. Stable focal density with volume loss and bronchiectasis in the lingula, measuring up to 1.8 x 1.7 cm on image 83, possibly related to previous radiation therapy. There are multiple new ill-defined nodular densities in the lower left hemithorax, measuring up to 13 mm on image 89. There are evolving radiation changes right suprahilar region with slightly increased consolidation. Right middle lobe volume loss and opacity are similar to previous studies. There is a new ill-defined subpleural density medially in the right lower lobe adjacent to the spine, measuring 8 mm on image 61. No discrete pulmonary nodularity. Upper abdomen: The visualized upper abdomen appears stable without suspicious findings. Musculoskeletal/Chest wall: There is no chest wall mass or suspicious osseous finding. Stable chronic Schmorl's nodes in thoracic spine. IMPRESSION: 1. Evolving radiation changes in the right suprahilar region. 2. Interval development of multiple ill-defined nodular densities at the left lung base status post remote  left lower lobe resection. Similar single component medially in the right lower lobe. Multiplicity and ill-defined nature favor inflammation as the etiology. Continued follow-up recommended. 3. Chronic right middle lobe and lingular opacities are stable from recent prior  studies. 4. No typical metastatic disease, adenopathy or pleural effusion. Electronically Signed   By: Richardean Sale M.D.   On: 05/23/2017 17:22    ASSESSMENT AND PLAN:  This is a very pleasant 83 years old white female with recurrent non-small cell lung cancer, adenocarcinoma status post induction systemic chemotherapy was carboplatin and Alimta with partial response. She is currently on maintenance treatment with single agent Alimta status post 108 cycles. She tolerated the last cycle of her treatment fairly well with no significant adverse effects. The patient had a repeat CT scan of the chest performed recently.  I personally and independently reviewed the scan images and discussed the results with the patient today. Her scan showed no clear evidence for disease progression but there is development of multiple ill-defined nodular density at the left lung base.  We will continue to monitor this closely on upcoming imaging studies. I recommended for the patient to proceed with cycle #109 today as a schedule. I will see her back for follow-up visit in 3 weeks for evaluation before starting cycle #110. For the dry cough, I gave the patient refill of Tussionex today. She was advised to call immediately if she has any concerning symptoms in the interval. The patient voices understanding of current disease status and treatment options and is in agreement with the current care plan. All questions were answered. The patient knows to call the clinic with any problems, questions or concerns. We can certainly see the patient much sooner if necessary.  Disclaimer: This note was dictated with voice recognition software. Similar sounding words can inadvertently be transcribed and may not be corrected upon review.

## 2017-06-18 ENCOUNTER — Telehealth: Payer: Self-pay | Admitting: Internal Medicine

## 2017-06-18 ENCOUNTER — Inpatient Hospital Stay: Payer: Medicare Other | Admitting: Internal Medicine

## 2017-06-18 ENCOUNTER — Inpatient Hospital Stay: Payer: Medicare Other | Attending: Internal Medicine

## 2017-06-18 ENCOUNTER — Encounter: Payer: Self-pay | Admitting: Internal Medicine

## 2017-06-18 ENCOUNTER — Inpatient Hospital Stay: Payer: Medicare Other

## 2017-06-18 ENCOUNTER — Encounter: Payer: Self-pay | Admitting: Medical Oncology

## 2017-06-18 VITALS — BP 122/71 | HR 74 | Temp 98.1°F | Resp 17 | Ht 62.0 in | Wt 125.5 lb

## 2017-06-18 DIAGNOSIS — C3492 Malignant neoplasm of unspecified part of left bronchus or lung: Principal | ICD-10-CM

## 2017-06-18 DIAGNOSIS — Z923 Personal history of irradiation: Secondary | ICD-10-CM | POA: Diagnosis not present

## 2017-06-18 DIAGNOSIS — Z85118 Personal history of other malignant neoplasm of bronchus and lung: Secondary | ICD-10-CM | POA: Diagnosis not present

## 2017-06-18 DIAGNOSIS — C3491 Malignant neoplasm of unspecified part of right bronchus or lung: Secondary | ICD-10-CM

## 2017-06-18 DIAGNOSIS — C3411 Malignant neoplasm of upper lobe, right bronchus or lung: Secondary | ICD-10-CM | POA: Diagnosis present

## 2017-06-18 DIAGNOSIS — Z5111 Encounter for antineoplastic chemotherapy: Secondary | ICD-10-CM

## 2017-06-18 DIAGNOSIS — Z9221 Personal history of antineoplastic chemotherapy: Secondary | ICD-10-CM

## 2017-06-18 DIAGNOSIS — J700 Acute pulmonary manifestations due to radiation: Secondary | ICD-10-CM

## 2017-06-18 LAB — COMPREHENSIVE METABOLIC PANEL
ALBUMIN: 3.3 g/dL — AB (ref 3.5–5.0)
ALK PHOS: 81 U/L (ref 40–150)
ALT: 8 U/L (ref 0–55)
ANION GAP: 8 (ref 3–11)
AST: 19 U/L (ref 5–34)
BILIRUBIN TOTAL: 0.5 mg/dL (ref 0.2–1.2)
BUN: 15 mg/dL (ref 7–26)
CALCIUM: 8.8 mg/dL (ref 8.4–10.4)
CO2: 24 mmol/L (ref 22–29)
Chloride: 108 mmol/L (ref 98–109)
Creatinine, Ser: 0.85 mg/dL (ref 0.60–1.10)
GFR calc Af Amer: >60 mL/min (ref >60)
GFR calc non Af Amer: >60 mL/min (ref >60)
GLUCOSE: 80 mg/dL (ref 70–140)
Potassium: 4 mmol/L (ref 3.3–4.7)
SODIUM: 140 mmol/L (ref 136–145)
TOTAL PROTEIN: 7.1 g/dL (ref 6.4–8.3)

## 2017-06-18 LAB — CBC WITH DIFFERENTIAL/PLATELET
BASOS PCT: 1 %
Basophils Absolute: 0 10*3/uL (ref 0.0–0.1)
EOS PCT: 1 %
Eosinophils Absolute: 0 10*3/uL (ref 0.0–0.5)
HEMATOCRIT: 36.6 % (ref 34.8–46.6)
Hemoglobin: 12.2 g/dL (ref 11.6–15.9)
LYMPHS PCT: 30 %
Lymphs Abs: 1 10*3/uL (ref 0.9–3.3)
MCH: 33.1 pg (ref 25.1–34.0)
MCHC: 33.5 g/dL (ref 31.5–36.0)
MCV: 99 fL (ref 79.5–101.0)
MONO ABS: 0.3 10*3/uL (ref 0.1–0.9)
MONOS PCT: 10 %
NEUTROS ABS: 1.9 10*3/uL (ref 1.5–6.5)
Neutrophils Relative %: 58 %
PLATELETS: 262 10*3/uL (ref 145–400)
RBC: 3.69 MIL/uL — ABNORMAL LOW (ref 3.70–5.45)
RDW: 14.7 % (ref 11.2–16.1)
WBC: 3.3 10*3/uL — ABNORMAL LOW (ref 3.9–10.3)

## 2017-06-18 MED ORDER — SODIUM CHLORIDE 0.9 % IV SOLN
400.0000 mg/m2 | Freq: Once | INTRAVENOUS | Status: AC
  Administered 2017-06-18: 675 mg via INTRAVENOUS
  Filled 2017-06-18: qty 20

## 2017-06-18 MED ORDER — SODIUM CHLORIDE 0.9 % IV SOLN
Freq: Once | INTRAVENOUS | Status: AC
  Administered 2017-06-18: 12:00:00 via INTRAVENOUS

## 2017-06-18 MED ORDER — SODIUM CHLORIDE 0.9 % IJ SOLN
10.0000 mL | INTRAMUSCULAR | Status: DC | PRN
  Administered 2017-06-18: 10 mL
  Filled 2017-06-18: qty 10

## 2017-06-18 MED ORDER — CYANOCOBALAMIN 1000 MCG/ML IJ SOLN
INTRAMUSCULAR | Status: AC
  Filled 2017-06-18: qty 1

## 2017-06-18 MED ORDER — DEXAMETHASONE SODIUM PHOSPHATE 100 MG/10ML IJ SOLN
Freq: Once | INTRAMUSCULAR | Status: AC
  Administered 2017-06-18: 13:00:00 via INTRAVENOUS
  Filled 2017-06-18: qty 8

## 2017-06-18 MED ORDER — CYANOCOBALAMIN 1000 MCG/ML IJ SOLN
1000.0000 ug | Freq: Once | INTRAMUSCULAR | Status: AC
  Administered 2017-06-18: 1000 ug via INTRAMUSCULAR

## 2017-06-18 MED ORDER — HEPARIN SOD (PORK) LOCK FLUSH 100 UNIT/ML IV SOLN
500.0000 [IU] | Freq: Once | INTRAVENOUS | Status: AC | PRN
  Administered 2017-06-18: 500 [IU]
  Filled 2017-06-18: qty 5

## 2017-06-18 NOTE — Telephone Encounter (Signed)
Gave avs and calendar for February - april

## 2017-06-18 NOTE — Patient Instructions (Signed)
[name] Discharge Instructions for Patients Receiving Chemotherapy  Today you received the following chemotherapy agents Alimpta  To help prevent nausea and vomiting after your treatment, we encourage you to take your nausea medication as directed.   If you develop nausea and vomiting that is not controlled by your nausea medication, call the clinic.   BELOW ARE SYMPTOMS THAT SHOULD BE REPORTED IMMEDIATELY:  *FEVER GREATER THAN 100.5 F  *CHILLS WITH OR WITHOUT FEVER  NAUSEA AND VOMITING THAT IS NOT CONTROLLED WITH YOUR NAUSEA MEDICATION  *UNUSUAL SHORTNESS OF BREATH  *UNUSUAL BRUISING OR BLEEDING  TENDERNESS IN MOUTH AND THROAT WITH OR WITHOUT PRESENCE OF ULCERS  *URINARY PROBLEMS  *BOWEL PROBLEMS  UNUSUAL RASH Items with * indicate a potential emergency and should be followed up as soon as possible.  Feel free to call the clinic should you have any questions or concerns. The clinic phone number is [phone].  Please show the [facility] at check-in to the Emergency Department and triage nurse.

## 2017-06-18 NOTE — Progress Notes (Signed)
[place] Telephone:([phone]   [phone]) [phone]  OFFICE PROGRESS NOTE  [name], MD [place] [address]  DIAGNOSIS: Recurrent non-small cell lung cancer, adenocarcinoma initially diagnosed as stage IB (T2, N0, M0) in June 2008 with tumor size of 8.0 cm.  PRIOR THERAPY: #1 status post left upper lobectomy with lymph node dissection under the care of Dr. Arlyce Dice on 06/29/2006.  #2 status post 4 cycles of adjuvant chemotherapy with cisplatin and Taxotere last dose given 08/01/2006.  #3 status post 6 cycles of systemic chemotherapy with carboplatin and Alimta for disease recurrence last dose given 12/12/2009.  #4 palliative radiotherapy to the enlarging right upper lobe lung mass under the care of Dr. Pablo Ledger completed on 11/14/2015.  CURRENT THERAPY: Maintenance systemic chemotherapy with single agent Alimta 400 MG/M2 every 3 weeks, status post 109 cycles.  INTERVAL HISTORY: [name] 84 y.o. female returns to the clinic today for follow-up visit.  The patient continues to tolerate her maintenance treatment with Alimta fairly well.  She continues to have dry cough secondary to radiation pneumonitis.  The patient denied having any chest pain, shortness of breath or hemoptysis.  She denied having any recent weight loss or night sweats.  She has no nausea, vomiting, diarrhea or constipation.  She is here today for evaluation before starting cycle 110 of her maintenance treatment.   MEDICAL HISTORY: Past Medical History:  Diagnosis Date  . FHx: chemotherapy 2008&2011   4 cycles cisplatin,taxotere,2008& carboplatin and Alimta 2011  . History of migraine headaches   . Hypercholesterolemia   . lung ca dx'd 06/2006   rt and lt lung  . Lung cancer ([place]) 3/14/1   right- Adenocarcinoma w/bronchioalveolar features  . Radiation 11/01/15-11/14/15   right upper lobe 30 gray  . Radiation pneumonitis ([facility]) 01/24/2016    ALLERGIES:  is  allergic to simvastatin.  MEDICATIONS:  Current Outpatient Medications  Medication Sig Dispense Refill  . albuterol (PROVENTIL HFA;VENTOLIN HFA) 108 (90 Base) MCG/ACT inhaler Inhale 1-2 puffs into the lungs every 6 (six) hours as needed for wheezing or shortness of breath. 1 Inhaler 2  . Calcium Carbonate-Vit D-Min (CALTRATE PLUS PO) Take 500 mg by mouth 2 (two) times daily. Reported on 12/13/2015    . chlorpheniramine-HYDROcodone (TUSSIONEX) 10-8 MG/5ML SUER Take 5 mLs by mouth 2 (two) times daily as needed for cough. 740 mL 0  . folic acid (FOLVITE) 1 MG tablet Take 1 mg by mouth daily. Reported on 12/13/2015    . Multiple Vitamins-Minerals (CENTRUM SILVER PO) Take 1 tablet by mouth daily. Reported on 12/13/2015    . prochlorperazine (COMPAZINE) 10 MG tablet Take 1 tablet (10 mg total) by mouth every 6 (six) hours as needed. (Patient not taking: Reported on 04/16/2017) 15 tablet 1  . vitamin B-12 (CYANOCOBALAMIN) 500 MCG tablet Take 1,500 mcg by mouth daily. Reported on 12/13/2015     No current facility-administered medications for this visit.     SURGICAL HISTORY:  Past Surgical History:  Procedure Laterality Date  . CATARACT EXTRACTION  2013   bilateral  . left  lowerlung lobectomy Left 06/19/2006   Dr.Burney,Left lower lobectomy  . Porta-cath  2011    REVIEW OF SYSTEMS:  A comprehensive review of systems was negative except for: Respiratory: positive for cough   PHYSICAL EXAMINATION: General appearance: alert, cooperative and no distress Head: Normocephalic, without obvious abnormality, atraumatic Neck: no adenopathy, no JVD, supple, symmetrical, trachea midline and thyroid not  enlarged, symmetric, no tenderness/mass/nodules Lymph nodes: Cervical, supraclavicular, and axillary nodes normal. Resp: rales LUL and wheezes bilaterally Back: symmetric, no curvature. ROM normal. No CVA tenderness. Cardio: regular rate and rhythm, S1, S2 normal, no murmur, click, rub or gallop and normal  apical impulse GI: soft, non-tender; bowel sounds normal; no masses,  no organomegaly Extremities: extremities normal, atraumatic, no cyanosis or edema  ECOG PERFORMANCE STATUS: 1 - Symptomatic but completely ambulatory  Blood pressure 122/71, pulse 74, temperature 98.1 F (36.7 C), temperature source Oral, resp. rate 17, height 5\' 2"  (1.575 m), weight 125 lb 8 oz (56.9 kg), SpO2 100 %.  LABORATORY DATA: Lab Results  Component Value Date   WBC 3.3 (L) 06/18/2017   HGB 12.2 06/18/2017   HCT 36.6 06/18/2017   MCV 99.0 06/18/2017   PLT 262 06/18/2017      Chemistry      Component Value Date/Time   NA 140 06/18/2017 0947   NA 142 05/28/2017 1015   K 4.0 06/18/2017 0947   K 4.2 05/28/2017 1015   CL 108 06/18/2017 0947   CL 108 (H) 11/04/2012 0947   CO2 24 06/18/2017 0947   CO2 23 05/28/2017 1015   BUN 15 06/18/2017 0947   BUN 14.2 05/28/2017 1015   CREATININE 0.85 06/18/2017 0947   CREATININE 0.9 05/28/2017 1015      Component Value Date/Time   CALCIUM 8.8 06/18/2017 0947   CALCIUM 9.0 05/28/2017 1015   ALKPHOS 81 06/18/2017 0947   ALKPHOS 84 05/28/2017 1015   AST 19 06/18/2017 0947   AST 19 05/28/2017 1015   ALT 8 06/18/2017 0947   ALT 7 05/28/2017 1015   BILITOT 0.5 06/18/2017 0947   BILITOT 0.54 05/28/2017 1015       RADIOGRAPHIC STUDIES: Ct Chest Wo Contrast  Result Date: 05/23/2017 CLINICAL DATA:  Recurrent non-small cell lung cancer (adenocarcinoma) initially diagnosed stage IB in June 2008. Subsequent recurrence with systemic chemotherapy in 2011 and palliative radiotherapy completed 11/14/2015. EXAM: CT CHEST WITHOUT CONTRAST TECHNIQUE: Multidetector CT imaging of the chest was performed following the standard protocol without IV contrast. COMPARISON:  Chest CT 01/20/2017 and 10/29/2016. FINDINGS: Cardiovascular: Similar atherosclerosis of the aorta, great vessels and coronary arteries. The ascending aorta measures 3.9 cm in diameter. Right IJ Port-A-Cath  extends to the lower SVC. The heart size is normal. There is no pericardial effusion. Mediastinum/Nodes: Stable soft tissue fullness around the right hilum without discrete adenopathy. No mediastinal or axillary lymphadenopathy. The thyroid gland, trachea and esophagus demonstrate no significant findings. Lungs/Pleura: There is no pleural effusion. Remote left lower lobe resection. Stable focal density with volume loss and bronchiectasis in the lingula, measuring up to 1.8 x 1.7 cm on image 83, possibly related to previous radiation therapy. There are multiple new ill-defined nodular densities in the lower left hemithorax, measuring up to 13 mm on image 89. There are evolving radiation changes right suprahilar region with slightly increased consolidation. Right middle lobe volume loss and opacity are similar to previous studies. There is a new ill-defined subpleural density medially in the right lower lobe adjacent to the spine, measuring 8 mm on image 61. No discrete pulmonary nodularity. Upper abdomen: The visualized upper abdomen appears stable without suspicious findings. Musculoskeletal/Chest wall: There is no chest wall mass or suspicious osseous finding. Stable chronic Schmorl's nodes in thoracic spine. IMPRESSION: 1. Evolving radiation changes in the right suprahilar region. 2. Interval development of multiple ill-defined nodular densities at the left lung base status post remote  left lower lobe resection. Similar single component medially in the right lower lobe. Multiplicity and ill-defined nature favor inflammation as the etiology. Continued follow-up recommended. 3. Chronic right middle lobe and lingular opacities are stable from recent prior studies. 4. No typical metastatic disease, adenopathy or pleural effusion. Electronically Signed   By: Richardean Sale M.D.   On: 05/23/2017 17:22    ASSESSMENT AND PLAN:  This is a very pleasant 84 years old white female with recurrent non-small cell lung  cancer, adenocarcinoma status post induction systemic chemotherapy was carboplatin and Alimta with partial response. She is currently on maintenance treatment with single agent Alimta status post 109 cycles. The patient continues to tolerate this treatment well with no concerning complaints. I recommended for her to proceed with cycle 110 today as a scheduled. I will see her back for follow-up visit in 3 weeks for evaluation with the start of the next cycle of her treatment. The patient was advised to call immediately if she has any concerning symptoms in the interval. The patient voices understanding of current disease status and treatment options and is in agreement with the current care plan. All questions were answered. The patient knows to call the clinic with any problems, questions or concerns. We can certainly see the patient much sooner if necessary.  Disclaimer: This note was dictated with voice recognition software. Similar sounding words can inadvertently be transcribed and may not be corrected upon review.

## 2017-07-09 ENCOUNTER — Encounter: Payer: Self-pay | Admitting: Internal Medicine

## 2017-07-09 ENCOUNTER — Inpatient Hospital Stay: Payer: Medicare Other

## 2017-07-09 ENCOUNTER — Telehealth: Payer: Self-pay | Admitting: Internal Medicine

## 2017-07-09 ENCOUNTER — Inpatient Hospital Stay: Payer: Medicare Other | Attending: Internal Medicine | Admitting: Internal Medicine

## 2017-07-09 DIAGNOSIS — J40 Bronchitis, not specified as acute or chronic: Secondary | ICD-10-CM

## 2017-07-09 DIAGNOSIS — C3492 Malignant neoplasm of unspecified part of left bronchus or lung: Principal | ICD-10-CM

## 2017-07-09 DIAGNOSIS — C3490 Malignant neoplasm of unspecified part of unspecified bronchus or lung: Secondary | ICD-10-CM

## 2017-07-09 DIAGNOSIS — C3411 Malignant neoplasm of upper lobe, right bronchus or lung: Secondary | ICD-10-CM | POA: Diagnosis present

## 2017-07-09 DIAGNOSIS — C3491 Malignant neoplasm of unspecified part of right bronchus or lung: Secondary | ICD-10-CM

## 2017-07-09 DIAGNOSIS — Z5111 Encounter for antineoplastic chemotherapy: Secondary | ICD-10-CM | POA: Insufficient documentation

## 2017-07-09 LAB — CBC WITH DIFFERENTIAL/PLATELET
BASOS ABS: 0 10*3/uL (ref 0.0–0.1)
BASOS PCT: 1 %
EOS PCT: 1 %
Eosinophils Absolute: 0 10*3/uL (ref 0.0–0.5)
HCT: 35.8 % (ref 34.8–46.6)
HEMOGLOBIN: 12 g/dL (ref 11.6–15.9)
LYMPHS ABS: 1.1 10*3/uL (ref 0.9–3.3)
Lymphocytes Relative: 28 %
MCH: 33.5 pg (ref 25.1–34.0)
MCHC: 33.5 g/dL (ref 31.5–36.0)
MCV: 99.9 fL (ref 79.5–101.0)
Monocytes Absolute: 0.4 10*3/uL (ref 0.1–0.9)
Monocytes Relative: 9 %
NEUTROS PCT: 61 %
Neutro Abs: 2.4 10*3/uL (ref 1.5–6.5)
PLATELETS: 291 10*3/uL (ref 145–400)
RBC: 3.58 MIL/uL — AB (ref 3.70–5.45)
RDW: 15.5 % — ABNORMAL HIGH (ref 11.2–14.5)
WBC: 3.9 10*3/uL (ref 3.9–10.3)

## 2017-07-09 LAB — COMPREHENSIVE METABOLIC PANEL
ALK PHOS: 87 U/L (ref 40–150)
ALT: 8 U/L (ref 0–55)
AST: 20 U/L (ref 5–34)
Albumin: 3.2 g/dL — ABNORMAL LOW (ref 3.5–5.0)
Anion gap: 8 (ref 3–11)
BILIRUBIN TOTAL: 0.4 mg/dL (ref 0.2–1.2)
BUN: 13 mg/dL (ref 7–26)
CALCIUM: 8.7 mg/dL (ref 8.4–10.4)
CHLORIDE: 106 mmol/L (ref 98–109)
CO2: 25 mmol/L (ref 22–29)
Creatinine, Ser: 0.83 mg/dL (ref 0.60–1.10)
GFR calc non Af Amer: >60 mL/min (ref >60)
Glucose, Bld: 82 mg/dL (ref 70–140)
Potassium: 4.3 mmol/L (ref 3.5–5.1)
Sodium: 139 mmol/L (ref 136–145)
TOTAL PROTEIN: 7.1 g/dL (ref 6.4–8.3)

## 2017-07-09 MED ORDER — ALBUTEROL SULFATE HFA 108 (90 BASE) MCG/ACT IN AERS: 1.0000 | INHALATION_SPRAY | Freq: Four times a day (QID) | RESPIRATORY_TRACT | 2 refills | Status: DC | PRN

## 2017-07-09 MED ORDER — HEPARIN SOD (PORK) LOCK FLUSH 100 UNIT/ML IV SOLN
500.0000 [IU] | Freq: Once | INTRAVENOUS | Status: AC | PRN
  Administered 2017-07-09: 500 [IU]
  Filled 2017-07-09: qty 5

## 2017-07-09 MED ORDER — SODIUM CHLORIDE 0.9 % IV SOLN
400.0000 mg/m2 | Freq: Once | INTRAVENOUS | Status: AC
  Administered 2017-07-09: 675 mg via INTRAVENOUS
  Filled 2017-07-09: qty 20

## 2017-07-09 MED ORDER — SODIUM CHLORIDE 0.9 % IV SOLN
Freq: Once | INTRAVENOUS | Status: AC
  Administered 2017-07-09: 11:00:00 via INTRAVENOUS

## 2017-07-09 MED ORDER — SODIUM CHLORIDE 0.9 % IJ SOLN
10.0000 mL | INTRAMUSCULAR | Status: DC | PRN
  Administered 2017-07-09: 10 mL
  Filled 2017-07-09: qty 10

## 2017-07-09 MED ORDER — SODIUM CHLORIDE 0.9 % IV SOLN
Freq: Once | INTRAVENOUS | Status: AC
  Administered 2017-07-09: 11:00:00 via INTRAVENOUS
  Filled 2017-07-09: qty 8

## 2017-07-09 MED FILL — VENTOLIN HFA 90 MCG INHALER: 108 (90 BAS | 25 days supply | Qty: 18 | Fill #0

## 2017-07-09 NOTE — Progress Notes (Signed)
[facility] [phone]   Fax:(336) [phone]  OFFICE PROGRESS NOTE  [name], MD [place] [address]  DIAGNOSIS: Recurrent non-small cell lung cancer, adenocarcinoma initially diagnosed as stage IB (T2, N0, M0) in June 2008 with tumor size of 8.0 cm.  PRIOR THERAPY: #1 status post left upper lobectomy with lymph node dissection under the care of Dr. Arlyce Dice on 06/29/2006.  #2 status post 4 cycles of adjuvant chemotherapy with cisplatin and Taxotere last dose given 08/01/2006.  #3 status post 6 cycles of systemic chemotherapy with carboplatin and Alimta for disease recurrence last dose given 12/12/2009.  #4 palliative radiotherapy to the enlarging right upper lobe lung mass under the care of Dr. Pablo Ledger completed on 11/14/2015.  CURRENT THERAPY: Maintenance systemic chemotherapy with single agent Alimta 400 MG/M2 every 3 weeks, status post 110 cycles.  INTERVAL HISTORY: [name] 84 y.o. female returns to the clinic today for follow-up visit.  The patient is feeling fine today with no specific complaints except for the dry cough and wheezing.  She is requesting refill of albuterol inhaler.  She denied having any chest pain, shortness breath or hemoptysis.  She denied having any fever or chills.  She has no significant weight loss or night sweats.  She has no nausea, vomiting, diarrhea or constipation.  She continues to tolerate her treatment with Alimta fairly well.   MEDICAL HISTORY: Past Medical History:  Diagnosis Date  . FHx: chemotherapy 2008&2011   4 cycles cisplatin,taxotere,2008& carboplatin and Alimta 2011  . History of migraine headaches   . Hypercholesterolemia   . lung ca dx'd 06/2006   rt and lt lung  . Lung cancer ([place]) 3/14/1   right- Adenocarcinoma w/bronchioalveolar features  . Radiation 11/01/15-11/14/15   right upper lobe 30 gray  . Radiation pneumonitis ([place]) 01/24/2016    ALLERGIES:  is allergic  to simvastatin.  MEDICATIONS:  Current Outpatient Medications  Medication Sig Dispense Refill  . albuterol (PROVENTIL HFA;VENTOLIN HFA) 108 (90 Base) MCG/ACT inhaler Inhale 1-2 puffs into the lungs every 6 (six) hours as needed for wheezing or shortness of breath. 1 Inhaler 2  . Calcium Carbonate-Vit D-Min (CALTRATE PLUS PO) Take 500 mg by mouth 2 (two) times daily. Reported on 12/13/2015    . chlorpheniramine-HYDROcodone (TUSSIONEX) 10-8 MG/5ML SUER Take 5 mLs by mouth 2 (two) times daily as needed for cough. 774 mL 0  . folic acid (FOLVITE) 1 MG tablet Take 1 mg by mouth daily. Reported on 12/13/2015    . Multiple Vitamins-Minerals (CENTRUM SILVER PO) Take 1 tablet by mouth daily. Reported on 12/13/2015    . prochlorperazine (COMPAZINE) 10 MG tablet Take 1 tablet (10 mg total) by mouth every 6 (six) hours as needed. (Patient not taking: Reported on 04/16/2017) 15 tablet 1  . vitamin B-12 (CYANOCOBALAMIN) 500 MCG tablet Take 1,500 mcg by mouth daily. Reported on 12/13/2015     No current facility-administered medications for this visit.     SURGICAL HISTORY:  Past Surgical History:  Procedure Laterality Date  . CATARACT EXTRACTION  2013   bilateral  . left  lowerlung lobectomy Left 06/19/2006   Dr.Burney,Left lower lobectomy  . Porta-cath  2011    REVIEW OF SYSTEMS:  A comprehensive review of systems was negative except for: Respiratory: positive for cough and wheezing   PHYSICAL EXAMINATION: General appearance: alert, cooperative and no distress Head: Normocephalic, without obvious abnormality, atraumatic Neck: no adenopathy, no JVD, supple, symmetrical,  trachea midline and thyroid not enlarged, symmetric, no tenderness/mass/nodules Lymph nodes: Cervical, supraclavicular, and axillary nodes normal. Resp: rales LUL and wheezes bilaterally Back: symmetric, no curvature. ROM normal. No CVA tenderness. Cardio: regular rate and rhythm, S1, S2 normal, no murmur, click, rub or gallop and  normal apical impulse GI: soft, non-tender; bowel sounds normal; no masses,  no organomegaly Extremities: extremities normal, atraumatic, no cyanosis or edema  ECOG PERFORMANCE STATUS: 1 - Symptomatic but completely ambulatory  Blood pressure 130/68, pulse 88, temperature 98.3 F (36.8 C), temperature source Oral, resp. rate 16, height 5\' 2"  (1.575 m), weight 126 lb 4.8 oz (57.3 kg), SpO2 99 %.  LABORATORY DATA: Lab Results  Component Value Date   WBC 3.9 07/09/2017   HGB 12.0 07/09/2017   HCT 35.8 07/09/2017   MCV 99.9 07/09/2017   PLT 291 07/09/2017      Chemistry      Component Value Date/Time   NA 140 06/18/2017 0947   NA 142 05/28/2017 1015   K 4.0 06/18/2017 0947   K 4.2 05/28/2017 1015   CL 108 06/18/2017 0947   CL 108 (H) 11/04/2012 0947   CO2 24 06/18/2017 0947   CO2 23 05/28/2017 1015   BUN 15 06/18/2017 0947   BUN 14.2 05/28/2017 1015   CREATININE 0.85 06/18/2017 0947   CREATININE 0.9 05/28/2017 1015      Component Value Date/Time   CALCIUM 8.8 06/18/2017 0947   CALCIUM 9.0 05/28/2017 1015   ALKPHOS 81 06/18/2017 0947   ALKPHOS 84 05/28/2017 1015   AST 19 06/18/2017 0947   AST 19 05/28/2017 1015   ALT 8 06/18/2017 0947   ALT 7 05/28/2017 1015   BILITOT 0.5 06/18/2017 0947   BILITOT 0.54 05/28/2017 1015       RADIOGRAPHIC STUDIES: No results found.  ASSESSMENT AND PLAN:  This is a very pleasant 84 years old white female with recurrent non-small cell lung cancer, adenocarcinoma status post induction systemic chemotherapy was carboplatin and Alimta with partial response. She is currently on maintenance treatment with single agent Alimta status post 110 cycles. She continues to tolerate this treatment fairly well with no concerning complaints. I recommended for her to proceed with cycle 111. For the wheezing and cough, I gave her a refill of albuterol inhaler. She will come back for follow-up visit in 3 weeks for evaluation before starting cycle  112. The patient was advised to call immediately if she has any concerning symptoms in the interval. The patient voices understanding of current disease status and treatment options and is in agreement with the current care plan. All questions were answered. The patient knows to call the clinic with any problems, questions or concerns. We can certainly see the patient much sooner if necessary.  Disclaimer: This note was dictated with voice recognition software. Similar sounding words can inadvertently be transcribed and may not be corrected upon review.

## 2017-07-09 NOTE — Patient Instructions (Signed)
[facility] Discharge Instructions for Patients Receiving Chemotherapy  Today you received the following chemotherapy agents:  Alimta.  To help prevent nausea and vomiting after your treatment, we encourage you to take your nausea medication as directed.   If you develop nausea and vomiting that is not controlled by your nausea medication, call the clinic.   BELOW ARE SYMPTOMS THAT SHOULD BE REPORTED IMMEDIATELY:  *FEVER GREATER THAN 100.5 F  *CHILLS WITH OR WITHOUT FEVER  NAUSEA AND VOMITING THAT IS NOT CONTROLLED WITH YOUR NAUSEA MEDICATION  *UNUSUAL SHORTNESS OF BREATH  *UNUSUAL BRUISING OR BLEEDING  TENDERNESS IN MOUTH AND THROAT WITH OR WITHOUT PRESENCE OF ULCERS  *URINARY PROBLEMS  *BOWEL PROBLEMS  UNUSUAL RASH Items with * indicate a potential emergency and should be followed up as soon as possible.  Feel free to call the clinic should you have any questions or concerns. The clinic phone number is [phone].  Please show the [facility] at check-in to the Emergency Department and triage nurse.

## 2017-07-09 NOTE — Telephone Encounter (Signed)
Scheduled appt per 2/6 los - Gave patient AVS and calender per los.

## 2017-07-30 ENCOUNTER — Inpatient Hospital Stay: Payer: Medicare Other | Admitting: Internal Medicine

## 2017-07-30 ENCOUNTER — Encounter: Payer: Self-pay | Admitting: Internal Medicine

## 2017-07-30 ENCOUNTER — Telehealth: Payer: Self-pay | Admitting: Internal Medicine

## 2017-07-30 ENCOUNTER — Inpatient Hospital Stay: Payer: Medicare Other

## 2017-07-30 VITALS — BP 123/70 | HR 89 | Temp 98.2°F | Resp 16 | Ht 62.0 in | Wt 124.9 lb

## 2017-07-30 DIAGNOSIS — C3492 Malignant neoplasm of unspecified part of left bronchus or lung: Principal | ICD-10-CM

## 2017-07-30 DIAGNOSIS — Z5111 Encounter for antineoplastic chemotherapy: Secondary | ICD-10-CM

## 2017-07-30 DIAGNOSIS — C3491 Malignant neoplasm of unspecified part of right bronchus or lung: Secondary | ICD-10-CM

## 2017-07-30 DIAGNOSIS — C3411 Malignant neoplasm of upper lobe, right bronchus or lung: Secondary | ICD-10-CM | POA: Diagnosis not present

## 2017-07-30 LAB — CBC WITH DIFFERENTIAL/PLATELET
BASOS ABS: 0 10*3/uL (ref 0.0–0.1)
Basophils Relative: 1 %
Eosinophils Absolute: 0 10*3/uL (ref 0.0–0.5)
Eosinophils Relative: 1 %
HEMATOCRIT: 37.7 % (ref 34.8–46.6)
HEMOGLOBIN: 12.4 g/dL (ref 11.6–15.9)
LYMPHS PCT: 31 %
Lymphs Abs: 1.1 10*3/uL (ref 0.9–3.3)
MCH: 32.9 pg (ref 25.1–34.0)
MCHC: 32.9 g/dL (ref 31.5–36.0)
MCV: 100.1 fL (ref 79.5–101.0)
Monocytes Absolute: 0.4 10*3/uL (ref 0.1–0.9)
Monocytes Relative: 11 %
NEUTROS ABS: 2.1 10*3/uL (ref 1.5–6.5)
NEUTROS PCT: 56 %
Platelets: 271 10*3/uL (ref 145–400)
RBC: 3.77 MIL/uL (ref 3.70–5.45)
RDW: 15 % — ABNORMAL HIGH (ref 11.2–14.5)
WBC: 3.7 10*3/uL — AB (ref 3.9–10.3)

## 2017-07-30 LAB — COMPREHENSIVE METABOLIC PANEL
ALBUMIN: 3.3 g/dL — AB (ref 3.5–5.0)
ALT: 10 U/L (ref 0–55)
ANION GAP: 9 (ref 3–11)
AST: 20 U/L (ref 5–34)
Alkaline Phosphatase: 88 U/L (ref 40–150)
BUN: 13 mg/dL (ref 7–26)
CO2: 25 mmol/L (ref 22–29)
Calcium: 9.2 mg/dL (ref 8.4–10.4)
Chloride: 107 mmol/L (ref 98–109)
Creatinine, Ser: 0.89 mg/dL (ref 0.60–1.10)
GFR, EST NON AFRICAN AMERICAN: 59 mL/min — AB (ref >60)
GLUCOSE: 75 mg/dL (ref 70–140)
POTASSIUM: 3.8 mmol/L (ref 3.5–5.1)
Sodium: 141 mmol/L (ref 136–145)
TOTAL PROTEIN: 7.3 g/dL (ref 6.4–8.3)
Total Bilirubin: 0.6 mg/dL (ref 0.2–1.2)

## 2017-07-30 MED ORDER — SODIUM CHLORIDE 0.9 % IV SOLN
400.0000 mg/m2 | Freq: Once | INTRAVENOUS | Status: AC
  Administered 2017-07-30: 675 mg via INTRAVENOUS
  Filled 2017-07-30: qty 20

## 2017-07-30 MED ORDER — SODIUM CHLORIDE 0.9 % IV SOLN
Freq: Once | INTRAVENOUS | Status: AC
  Administered 2017-07-30: 11:00:00 via INTRAVENOUS

## 2017-07-30 MED ORDER — SODIUM CHLORIDE 0.9 % IV SOLN
Freq: Once | INTRAVENOUS | Status: AC
  Administered 2017-07-30: 11:00:00 via INTRAVENOUS
  Filled 2017-07-30: qty 8

## 2017-07-30 MED ORDER — SODIUM CHLORIDE 0.9 % IJ SOLN
10.0000 mL | INTRAMUSCULAR | Status: DC | PRN
  Administered 2017-07-30: 10 mL
  Filled 2017-07-30: qty 10

## 2017-07-30 MED ORDER — HEPARIN SOD (PORK) LOCK FLUSH 100 UNIT/ML IV SOLN
500.0000 [IU] | Freq: Once | INTRAVENOUS | Status: AC | PRN
  Administered 2017-07-30: 500 [IU]
  Filled 2017-07-30: qty 5

## 2017-07-30 NOTE — Patient Instructions (Signed)
[facility] Discharge Instructions for Patients Receiving Chemotherapy  Today you received the following chemotherapy agents:  Alimta.  To help prevent nausea and vomiting after your treatment, we encourage you to take your nausea medication as directed.   If you develop nausea and vomiting that is not controlled by your nausea medication, call the clinic.   BELOW ARE SYMPTOMS THAT SHOULD BE REPORTED IMMEDIATELY:  *FEVER GREATER THAN 100.5 F  *CHILLS WITH OR WITHOUT FEVER  NAUSEA AND VOMITING THAT IS NOT CONTROLLED WITH YOUR NAUSEA MEDICATION  *UNUSUAL SHORTNESS OF BREATH  *UNUSUAL BRUISING OR BLEEDING  TENDERNESS IN MOUTH AND THROAT WITH OR WITHOUT PRESENCE OF ULCERS  *URINARY PROBLEMS  *BOWEL PROBLEMS  UNUSUAL RASH Items with * indicate a potential emergency and should be followed up as soon as possible.  Feel free to call the clinic should you have any questions or concerns. The clinic phone number is [phone].  Please show the [facility] at check-in to the Emergency Department and triage nurse.

## 2017-07-30 NOTE — Progress Notes (Signed)
[facility] Telephone:([phone]) [phone]   Fax:(336) [phone]  OFFICE PROGRESS NOTE  [name], MD [place] [address]  DIAGNOSIS: Recurrent non-small cell lung cancer, adenocarcinoma initially diagnosed as stage IB (T2, N0, M0) in June 2008 with tumor size of 8.0 cm.  PRIOR THERAPY: #1 status post left upper lobectomy with lymph node dissection under the care of Dr. Arlyce Dice on 06/29/2006.  #2 status post 4 cycles of adjuvant chemotherapy with cisplatin and Taxotere last dose given 08/01/2006.  #3 status post 6 cycles of systemic chemotherapy with carboplatin and Alimta for disease recurrence last dose given 12/12/2009.  #4 palliative radiotherapy to the enlarging right upper lobe lung mass under the care of Dr. Pablo Ledger completed on 11/14/2015.  CURRENT THERAPY: Maintenance systemic chemotherapy with single agent Alimta 400 MG/M2 every 3 weeks, status post 111 cycles.  INTERVAL HISTORY: [name] 84 y.o. female returns to the clinic today for follow-up visit.  The patient is feeling fine today with no specific complaints except for dry cough.  She is currently taking Delsym and feeling much better.  She denied having any chest pain, shortness breath or hemoptysis.  She denied having any recent weight loss or night sweats.  She has no nausea, vomiting, diarrhea or constipation.  She has no fever or chills.  The patient is here today for evaluation before starting cycle #112 of her maintenance treatment with Alimta.   MEDICAL HISTORY: Past Medical History:  Diagnosis Date  . FHx: chemotherapy 2008&2011   4 cycles cisplatin,taxotere,2008& carboplatin and Alimta 2011  . History of migraine headaches   . Hypercholesterolemia   . lung ca dx'd 06/2006   rt and lt lung  . Lung cancer ([place]) 3/14/1   right- Adenocarcinoma w/bronchioalveolar features  . Radiation 11/01/15-11/14/15   right upper lobe 30 gray  . Radiation pneumonitis ([place])  01/24/2016    ALLERGIES:  is allergic to simvastatin.  MEDICATIONS:  Current Outpatient Medications  Medication Sig Dispense Refill  . albuterol (PROVENTIL HFA;VENTOLIN HFA) 108 (90 Base) MCG/ACT inhaler Inhale 1-2 puffs into the lungs every 6 (six) hours as needed for wheezing or shortness of breath. 1 Inhaler 2  . Calcium Carbonate-Vit D-Min (CALTRATE PLUS PO) Take 500 mg by mouth 2 (two) times daily. Reported on 12/13/2015    . chlorpheniramine-HYDROcodone (TUSSIONEX) 10-8 MG/5ML SUER Take 5 mLs by mouth 2 (two) times daily as needed for cough. 563 mL 0  . folic acid (FOLVITE) 1 MG tablet Take 1 mg by mouth daily. Reported on 12/13/2015    . Multiple Vitamins-Minerals (CENTRUM SILVER PO) Take 1 tablet by mouth daily. Reported on 12/13/2015    . prochlorperazine (COMPAZINE) 10 MG tablet Take 1 tablet (10 mg total) by mouth every 6 (six) hours as needed. (Patient not taking: Reported on 04/16/2017) 15 tablet 1  . vitamin B-12 (CYANOCOBALAMIN) 500 MCG tablet Take 1,500 mcg by mouth daily. Reported on 12/13/2015     No current facility-administered medications for this visit.     SURGICAL HISTORY:  Past Surgical History:  Procedure Laterality Date  . CATARACT EXTRACTION  2013   bilateral  . left  lowerlung lobectomy Left 06/19/2006   Dr.Burney,Left lower lobectomy  . Porta-cath  2011    REVIEW OF SYSTEMS:  A comprehensive review of systems was negative except for: Respiratory: positive for cough   PHYSICAL EXAMINATION: General appearance: alert, cooperative and no distress Head: Normocephalic, without obvious abnormality, atraumatic Neck: no adenopathy,  no JVD, supple, symmetrical, trachea midline and thyroid not enlarged, symmetric, no tenderness/mass/nodules Lymph nodes: Cervical, supraclavicular, and axillary nodes normal. Resp: rales LUL and wheezes bilaterally Back: symmetric, no curvature. ROM normal. No CVA tenderness. Cardio: regular rate and rhythm, S1, S2 normal, no murmur,  click, rub or gallop and normal apical impulse GI: soft, non-tender; bowel sounds normal; no masses,  no organomegaly Extremities: extremities normal, atraumatic, no cyanosis or edema  ECOG PERFORMANCE STATUS: 1 - Symptomatic but completely ambulatory  Blood pressure 123/70, pulse 89, temperature 98.2 F (36.8 C), temperature source Oral, resp. rate 16, height 5\' 2"  (1.575 m), weight 124 lb 14.4 oz (56.7 kg), SpO2 99 %.  LABORATORY DATA: Lab Results  Component Value Date   WBC 3.7 (L) 07/30/2017   HGB 12.4 07/30/2017   HCT 37.7 07/30/2017   MCV 100.1 07/30/2017   PLT 271 07/30/2017      Chemistry      Component Value Date/Time   NA 139 07/09/2017 0928   NA 142 05/28/2017 1015   K 4.3 07/09/2017 0928   K 4.2 05/28/2017 1015   CL 106 07/09/2017 0928   CL 108 (H) 11/04/2012 0947   CO2 25 07/09/2017 0928   CO2 23 05/28/2017 1015   BUN 13 07/09/2017 0928   BUN 14.2 05/28/2017 1015   CREATININE 0.83 07/09/2017 0928   CREATININE 0.9 05/28/2017 1015      Component Value Date/Time   CALCIUM 8.7 07/09/2017 0928   CALCIUM 9.0 05/28/2017 1015   ALKPHOS 87 07/09/2017 0928   ALKPHOS 84 05/28/2017 1015   AST 20 07/09/2017 0928   AST 19 05/28/2017 1015   ALT 8 07/09/2017 0928   ALT 7 05/28/2017 1015   BILITOT 0.4 07/09/2017 0928   BILITOT 0.54 05/28/2017 1015       RADIOGRAPHIC STUDIES: No results found.  ASSESSMENT AND PLAN:  This is a very pleasant 84 years old white female with recurrent non-small cell lung cancer, adenocarcinoma status post induction systemic chemotherapy with carboplatin and Alimta with partial response. She is currently on maintenance treatment with single agent Alimta status post 111 cycles. The patient tolerated the last cycle of her treatment fairly well. I recommended for her to proceed with cycle #112 today as a scheduled. She will come back for follow-up visit in 3 weeks for evaluation before the next cycle of her treatment. For the cough, she  will continue on Delsym as needed. She was advised to call immediately if she has any concerning symptoms in the interval. The patient voices understanding of current disease status and treatment options and is in agreement with the current care plan. All questions were answered. The patient knows to call the clinic with any problems, questions or concerns. We can certainly see the patient much sooner if necessary.  Disclaimer: This note was dictated with voice recognition software. Similar sounding words can inadvertently be transcribed and may not be corrected upon review.

## 2017-07-30 NOTE — Telephone Encounter (Signed)
Appts already scheduled per 2/27 los - no additional appts added.

## 2017-08-17 ENCOUNTER — Encounter: Payer: Self-pay | Admitting: Pharmacist

## 2017-08-20 ENCOUNTER — Telehealth: Payer: Self-pay | Admitting: Internal Medicine

## 2017-08-20 ENCOUNTER — Inpatient Hospital Stay: Payer: Medicare Other | Attending: Internal Medicine | Admitting: Internal Medicine

## 2017-08-20 ENCOUNTER — Inpatient Hospital Stay: Payer: Medicare Other

## 2017-08-20 ENCOUNTER — Encounter: Payer: Self-pay | Admitting: Internal Medicine

## 2017-08-20 ENCOUNTER — Other Ambulatory Visit: Payer: Self-pay | Admitting: Internal Medicine

## 2017-08-20 DIAGNOSIS — Z5111 Encounter for antineoplastic chemotherapy: Secondary | ICD-10-CM | POA: Diagnosis present

## 2017-08-20 DIAGNOSIS — C3491 Malignant neoplasm of unspecified part of right bronchus or lung: Secondary | ICD-10-CM

## 2017-08-20 DIAGNOSIS — R05 Cough: Secondary | ICD-10-CM | POA: Diagnosis not present

## 2017-08-20 DIAGNOSIS — C3492 Malignant neoplasm of unspecified part of left bronchus or lung: Principal | ICD-10-CM

## 2017-08-20 DIAGNOSIS — C3490 Malignant neoplasm of unspecified part of unspecified bronchus or lung: Secondary | ICD-10-CM

## 2017-08-20 DIAGNOSIS — C3411 Malignant neoplasm of upper lobe, right bronchus or lung: Secondary | ICD-10-CM | POA: Diagnosis not present

## 2017-08-20 LAB — COMPREHENSIVE METABOLIC PANEL
ALK PHOS: 98 U/L (ref 40–150)
ALT: 10 U/L (ref 0–55)
AST: 21 U/L (ref 5–34)
Albumin: 3.1 g/dL — ABNORMAL LOW (ref 3.5–5.0)
Anion gap: 6 (ref 3–11)
BILIRUBIN TOTAL: 0.4 mg/dL (ref 0.2–1.2)
BUN: 14 mg/dL (ref 7–26)
CALCIUM: 9.4 mg/dL (ref 8.4–10.4)
CHLORIDE: 107 mmol/L (ref 98–109)
CO2: 27 mmol/L (ref 22–29)
CREATININE: 0.86 mg/dL (ref 0.60–1.10)
Glucose, Bld: 88 mg/dL (ref 70–140)
Potassium: 4 mmol/L (ref 3.5–5.1)
Sodium: 140 mmol/L (ref 136–145)
TOTAL PROTEIN: 7.3 g/dL (ref 6.4–8.3)

## 2017-08-20 LAB — CBC WITH DIFFERENTIAL/PLATELET
BASOS ABS: 0 10*3/uL (ref 0.0–0.1)
BASOS PCT: 1 %
EOS ABS: 0.1 10*3/uL (ref 0.0–0.5)
Eosinophils Relative: 2 %
HCT: 37.2 % (ref 34.8–46.6)
HEMOGLOBIN: 12.4 g/dL (ref 11.6–15.9)
Lymphocytes Relative: 29 %
Lymphs Abs: 1.1 10*3/uL (ref 0.9–3.3)
MCH: 33.4 pg (ref 25.1–34.0)
MCHC: 33.3 g/dL (ref 31.5–36.0)
MCV: 100.1 fL (ref 79.5–101.0)
Monocytes Absolute: 0.5 10*3/uL (ref 0.1–0.9)
Monocytes Relative: 13 %
NEUTROS PCT: 55 %
Neutro Abs: 2.2 10*3/uL (ref 1.5–6.5)
Platelets: 293 10*3/uL (ref 145–400)
RBC: 3.72 MIL/uL (ref 3.70–5.45)
RDW: 15.1 % — ABNORMAL HIGH (ref 11.2–14.5)
WBC: 3.9 10*3/uL (ref 3.9–10.3)

## 2017-08-20 MED ORDER — CYANOCOBALAMIN 1000 MCG/ML IJ SOLN
1000.0000 ug | Freq: Once | INTRAMUSCULAR | Status: AC
  Administered 2017-08-20: 1000 ug via INTRAMUSCULAR

## 2017-08-20 MED ORDER — SODIUM CHLORIDE 0.9 % IV SOLN
Freq: Once | INTRAVENOUS | Status: AC
  Administered 2017-08-20: 13:00:00 via INTRAVENOUS

## 2017-08-20 MED ORDER — CYANOCOBALAMIN 1000 MCG/ML IJ SOLN
INTRAMUSCULAR | Status: AC
  Filled 2017-08-20: qty 1

## 2017-08-20 MED ORDER — HEPARIN SOD (PORK) LOCK FLUSH 100 UNIT/ML IV SOLN
500.0000 [IU] | Freq: Once | INTRAVENOUS | Status: AC | PRN
  Administered 2017-08-20: 500 [IU]
  Filled 2017-08-20: qty 5

## 2017-08-20 MED ORDER — SODIUM CHLORIDE 0.9 % IJ SOLN
10.0000 mL | INTRAMUSCULAR | Status: DC | PRN
  Administered 2017-08-20: 10 mL
  Filled 2017-08-20: qty 10

## 2017-08-20 MED ORDER — SODIUM CHLORIDE 0.9 % IV SOLN
400.0000 mg/m2 | Freq: Once | INTRAVENOUS | Status: AC
  Administered 2017-08-20: 675 mg via INTRAVENOUS
  Filled 2017-08-20: qty 20

## 2017-08-20 MED ORDER — SODIUM CHLORIDE 0.9 % IV SOLN
Freq: Once | INTRAVENOUS | Status: AC
  Administered 2017-08-20: 14:00:00 via INTRAVENOUS
  Filled 2017-08-20: qty 8

## 2017-08-20 NOTE — Telephone Encounter (Signed)
appts already scheduled per 3/20 los.  - Gave patient AVS and calender per los.

## 2017-08-20 NOTE — Patient Instructions (Signed)
[facility] Discharge Instructions for Patients Receiving Chemotherapy  Today you received the following chemotherapy agents:  Alimta.  To help prevent nausea and vomiting after your treatment, we encourage you to take your nausea medication as directed.   If you develop nausea and vomiting that is not controlled by your nausea medication, call the clinic.   BELOW ARE SYMPTOMS THAT SHOULD BE REPORTED IMMEDIATELY:  *FEVER GREATER THAN 100.5 F  *CHILLS WITH OR WITHOUT FEVER  NAUSEA AND VOMITING THAT IS NOT CONTROLLED WITH YOUR NAUSEA MEDICATION  *UNUSUAL SHORTNESS OF BREATH  *UNUSUAL BRUISING OR BLEEDING  TENDERNESS IN MOUTH AND THROAT WITH OR WITHOUT PRESENCE OF ULCERS  *URINARY PROBLEMS  *BOWEL PROBLEMS  UNUSUAL RASH Items with * indicate a potential emergency and should be followed up as soon as possible.  Feel free to call the clinic should you have any questions or concerns. The clinic phone number is [phone].  Please show the [facility] at check-in to the Emergency Department and triage nurse.

## 2017-08-20 NOTE — Progress Notes (Signed)
[facility] Telephone:(336) [phone]   Fax:(336) [phone]  OFFICE PROGRESS NOTE  [name], MD [place] [address]  DIAGNOSIS: Recurrent non-small cell lung cancer, adenocarcinoma initially diagnosed as stage IB (T2, N0, M0) in June 2008 with tumor size of 8.0 cm.  PRIOR THERAPY: #1 status post left upper lobectomy with lymph node dissection under the care of Dr. Arlyce Dice on 06/29/2006.  #2 status post 4 cycles of adjuvant chemotherapy with cisplatin and Taxotere last dose given 08/01/2006.  #3 status post 6 cycles of systemic chemotherapy with carboplatin and Alimta for disease recurrence last dose given 12/12/2009.  #4 palliative radiotherapy to the enlarging right upper lobe lung mass under the care of Dr. Pablo Ledger completed on 11/14/2015.  CURRENT THERAPY: Maintenance systemic chemotherapy with single agent Alimta 400 MG/M2 every 3 weeks, status post 112 cycles.  INTERVAL HISTORY: [name] 84 y.o. female returns to the clinic today for follow-up visit.  The patient is tolerating her current maintenance treatment with Alimta fairly well.  She denied having any chest pain but continues to have dry cough with no shortness of breath or hemoptysis.  She denied having any fever or chills.  She has no nausea, vomiting, diarrhea or constipation.  She has no significant weight loss or night sweats.  She is here for evaluation before starting cycle #113.   MEDICAL HISTORY: Past Medical History:  Diagnosis Date  . FHx: chemotherapy 2008&2011   4 cycles cisplatin,taxotere,2008& carboplatin and Alimta 2011  . History of migraine headaches   . Hypercholesterolemia   . lung ca dx'd 06/2006   rt and lt lung  . Lung cancer ([place]) 3/14/1   right- Adenocarcinoma w/bronchioalveolar features  . Radiation 11/01/15-11/14/15   right upper lobe 30 gray  . Radiation pneumonitis ([place] [facility]) 01/24/2016    ALLERGIES:  is allergic to simvastatin.  MEDICATIONS:   Current Outpatient Medications  Medication Sig Dispense Refill  . albuterol (PROVENTIL HFA;VENTOLIN HFA) 108 (90 Base) MCG/ACT inhaler Inhale 1-2 puffs into the lungs every 6 (six) hours as needed for wheezing or shortness of breath. 1 Inhaler 2  . Calcium Carbonate-Vit D-Min (CALTRATE PLUS PO) Take 500 mg by mouth 2 (two) times daily. Reported on 12/13/2015    . chlorpheniramine-HYDROcodone (TUSSIONEX) 10-8 MG/5ML SUER Take 5 mLs by mouth 2 (two) times daily as needed for cough. 287 mL 0  . folic acid (FOLVITE) 1 MG tablet Take 1 mg by mouth daily. Reported on 12/13/2015    . Multiple Vitamins-Minerals (CENTRUM SILVER PO) Take 1 tablet by mouth daily. Reported on 12/13/2015    . prochlorperazine (COMPAZINE) 10 MG tablet Take 1 tablet (10 mg total) by mouth every 6 (six) hours as needed. (Patient not taking: Reported on 04/16/2017) 15 tablet 1  . vitamin B-12 (CYANOCOBALAMIN) 500 MCG tablet Take 1,500 mcg by mouth daily. Reported on 12/13/2015     No current facility-administered medications for this visit.     SURGICAL HISTORY:  Past Surgical History:  Procedure Laterality Date  . CATARACT EXTRACTION  2013   bilateral  . left  lowerlung lobectomy Left 06/19/2006   Dr.Burney,Left lower lobectomy  . Porta-cath  2011    REVIEW OF SYSTEMS:  A comprehensive review of systems was negative except for: Respiratory: positive for cough   PHYSICAL EXAMINATION: General appearance: alert, cooperative and no distress Head: Normocephalic, without obvious abnormality, atraumatic Neck: no adenopathy, no JVD, supple, symmetrical, trachea midline and thyroid not enlarged, symmetric,  no tenderness/mass/nodules Lymph nodes: Cervical, supraclavicular, and axillary nodes normal. Resp: wheezes bilaterally Back: symmetric, no curvature. ROM normal. No CVA tenderness. Cardio: regular rate and rhythm, S1, S2 normal, no murmur, click, rub or gallop and normal apical impulse GI: soft, non-tender; bowel sounds  normal; no masses,  no organomegaly Extremities: extremities normal, atraumatic, no cyanosis or edema  ECOG PERFORMANCE STATUS: 1 - Symptomatic but completely ambulatory  Blood pressure 140/77, pulse 87, temperature 98 F (36.7 C), temperature source Oral, resp. rate 20, height 5\' 2"  (1.575 m), weight 124 lb 14.4 oz (56.7 kg), SpO2 100 %.  LABORATORY DATA: Lab Results  Component Value Date   WBC 3.9 08/20/2017   HGB 12.4 08/20/2017   HCT 37.2 08/20/2017   MCV 100.1 08/20/2017   PLT 293 08/20/2017      Chemistry      Component Value Date/Time   NA 141 07/30/2017 0914   NA 142 05/28/2017 1015   K 3.8 07/30/2017 0914   K 4.2 05/28/2017 1015   CL 107 07/30/2017 0914   CL 108 (H) 11/04/2012 0947   CO2 25 07/30/2017 0914   CO2 23 05/28/2017 1015   BUN 13 07/30/2017 0914   BUN 14.2 05/28/2017 1015   CREATININE 0.89 07/30/2017 0914   CREATININE 0.9 05/28/2017 1015      Component Value Date/Time   CALCIUM 9.2 07/30/2017 0914   CALCIUM 9.0 05/28/2017 1015   ALKPHOS 88 07/30/2017 0914   ALKPHOS 84 05/28/2017 1015   AST 20 07/30/2017 0914   AST 19 05/28/2017 1015   ALT 10 07/30/2017 0914   ALT 7 05/28/2017 1015   BILITOT 0.6 07/30/2017 0914   BILITOT 0.54 05/28/2017 1015       RADIOGRAPHIC STUDIES: No results found.  ASSESSMENT AND PLAN:  This is a very pleasant 84 years old white female with recurrent non-small cell lung cancer, adenocarcinoma status post induction systemic chemotherapy with carboplatin and Alimta with partial response. She is currently on maintenance treatment with single agent Alimta status post 112 cycles. She continues to tolerate this treatment fairly well. I recommended for her to proceed with cycle #113 today as a scheduled. For the dry cough she would continue with the current cough medication. She will come back for follow-up visit in 3 weeks for evaluation before starting the next cycle of her treatment. The patient was advised to call  immediately if she has any concerning symptoms in the interval. The patient voices understanding of current disease status and treatment options and is in agreement with the current care plan. All questions were answered. The patient knows to call the clinic with any problems, questions or concerns. We can certainly see the patient much sooner if necessary.  Disclaimer: This note was dictated with voice recognition software. Similar sounding words can inadvertently be transcribed and may not be corrected upon review.

## 2017-08-22 ENCOUNTER — Telehealth: Payer: Self-pay | Admitting: *Deleted

## 2017-08-22 NOTE — Telephone Encounter (Signed)
Received call from Kenisha/Central Scheduling stating that she is calling regardin scheduling CT with contrast for pt & pt states that it should be without contrast.  She is asking for new order if it should be without & signed.  Message to Dr Mohamed/Pod RN

## 2017-08-23 ENCOUNTER — Other Ambulatory Visit: Payer: Self-pay | Admitting: Internal Medicine

## 2017-08-23 DIAGNOSIS — C3490 Malignant neoplasm of unspecified part of unspecified bronchus or lung: Secondary | ICD-10-CM

## 2017-08-23 NOTE — Telephone Encounter (Signed)
Done

## 2017-09-04 ENCOUNTER — Ambulatory Visit (HOSPITAL_COMMUNITY)
Admission: RE | Admit: 2017-09-04 | Discharge: 2017-09-04 | Disposition: A | Discharge status: Home / Self Care | Payer: Medicare Other | Source: Ambulatory Visit | Attending: Internal Medicine | Admitting: Internal Medicine

## 2017-09-04 DIAGNOSIS — C3490 Malignant neoplasm of unspecified part of unspecified bronchus or lung: Secondary | ICD-10-CM | POA: Diagnosis not present

## 2017-09-04 DIAGNOSIS — R918 Other nonspecific abnormal finding of lung field: Secondary | ICD-10-CM | POA: Insufficient documentation

## 2017-09-04 DIAGNOSIS — I2510 Atherosclerotic heart disease of native coronary artery without angina pectoris: Secondary | ICD-10-CM | POA: Diagnosis not present

## 2017-09-04 DIAGNOSIS — I700 Atherosclerosis of aorta: Secondary | ICD-10-CM | POA: Diagnosis not present

## 2017-09-10 ENCOUNTER — Inpatient Hospital Stay: Payer: Medicare Other

## 2017-09-10 ENCOUNTER — Encounter: Payer: Self-pay | Admitting: Internal Medicine

## 2017-09-10 ENCOUNTER — Telehealth: Payer: Self-pay | Admitting: Internal Medicine

## 2017-09-10 ENCOUNTER — Inpatient Hospital Stay: Payer: Medicare Other | Attending: Internal Medicine | Admitting: Internal Medicine

## 2017-09-10 VITALS — BP 124/69 | HR 98 | Temp 98.3°F | Resp 18 | Ht 62.0 in | Wt 121.8 lb

## 2017-09-10 DIAGNOSIS — Z5112 Encounter for antineoplastic immunotherapy: Secondary | ICD-10-CM | POA: Diagnosis present

## 2017-09-10 DIAGNOSIS — C3490 Malignant neoplasm of unspecified part of unspecified bronchus or lung: Secondary | ICD-10-CM

## 2017-09-10 DIAGNOSIS — Z7189 Other specified counseling: Secondary | ICD-10-CM | POA: Insufficient documentation

## 2017-09-10 DIAGNOSIS — R05 Cough: Secondary | ICD-10-CM

## 2017-09-10 DIAGNOSIS — C3411 Malignant neoplasm of upper lobe, right bronchus or lung: Secondary | ICD-10-CM | POA: Diagnosis not present

## 2017-09-10 DIAGNOSIS — C3491 Malignant neoplasm of unspecified part of right bronchus or lung: Secondary | ICD-10-CM

## 2017-09-10 DIAGNOSIS — C3492 Malignant neoplasm of unspecified part of left bronchus or lung: Secondary | ICD-10-CM

## 2017-09-10 LAB — CMP (CANCER CENTER ONLY)
ALT: 9 U/L (ref 0–55)
AST: 22 U/L (ref 5–34)
Albumin: 2.9 g/dL — ABNORMAL LOW (ref 3.5–5.0)
Alkaline Phosphatase: 96 U/L (ref 40–150)
Anion gap: 8 (ref 3–11)
BILIRUBIN TOTAL: 0.4 mg/dL (ref 0.2–1.2)
BUN: 13 mg/dL (ref 7–26)
CO2: 24 mmol/L (ref 22–29)
CREATININE: 0.9 mg/dL (ref 0.60–1.10)
Calcium: 9.2 mg/dL (ref 8.4–10.4)
Chloride: 107 mmol/L (ref 98–109)
GFR, EST NON AFRICAN AMERICAN: 58 mL/min — AB (ref >60)
GFR, Est AFR Am: >60 mL/min (ref >60)
Glucose, Bld: 107 mg/dL (ref 70–140)
POTASSIUM: 4.5 mmol/L (ref 3.5–5.1)
Sodium: 139 mmol/L (ref 136–145)
TOTAL PROTEIN: 7.4 g/dL (ref 6.4–8.3)

## 2017-09-10 LAB — CBC WITH DIFFERENTIAL (CANCER CENTER ONLY)
BASOS ABS: 0 10*3/uL (ref 0.0–0.1)
Basophils Relative: 1 %
EOS ABS: 0.1 10*3/uL (ref 0.0–0.5)
EOS PCT: 3 %
HCT: 37.6 % (ref 34.8–46.6)
Hemoglobin: 12.5 g/dL (ref 11.6–15.9)
Lymphocytes Relative: 19 %
Lymphs Abs: 0.9 10*3/uL (ref 0.9–3.3)
MCH: 32.6 pg (ref 25.1–34.0)
MCHC: 33.2 g/dL (ref 31.5–36.0)
MCV: 98 fL (ref 79.5–101.0)
Monocytes Absolute: 0.5 10*3/uL (ref 0.1–0.9)
Monocytes Relative: 11 %
Neutro Abs: 3.1 10*3/uL (ref 1.5–6.5)
Neutrophils Relative %: 66 %
PLATELETS: 339 10*3/uL (ref 145–400)
RBC: 3.83 MIL/uL (ref 3.70–5.45)
RDW: 14.9 % — ABNORMAL HIGH (ref 11.2–14.5)
WBC: 4.6 10*3/uL (ref 3.9–10.3)

## 2017-09-10 NOTE — Progress Notes (Signed)
START ON PATHWAY REGIMEN - Non-Small Cell Lung     A cycle is every 28 days:     Nivolumab   **Always confirm dose/schedule in your pharmacy ordering system**    Patient Characteristics: Stage IV Metastatic, Nonsquamous, Second Line - Chemotherapy/Immunotherapy, PS = 0, 1, No Prior PD-1/PD-L1  Inhibitor and Immunotherapy Candidate AJCC T Category: T2a Current Disease Status: Distant Metastases AJCC N Category: N0 AJCC M Category: M1a AJCC 8 Stage Grouping: IVA Histology: Nonsquamous Cell ROS1 Rearrangement Status: Negative T790M Mutation Status: Not Applicable - EGFR Mutation Negative/Unknown Other Mutations/Biomarkers: No Other Actionable Mutations PD-L1 Expression Status: Did Not Order Test Chemotherapy/Immunotherapy LOT: Second Line Chemotherapy/Immunotherapy Molecular Targeted Therapy: Not Appropriate ALK Translocation Status: Negative EGFR Mutation Status: Negative/Wild Type BRAF [id number] Mutation Status: Negative Performance Status: PS = 0, 1 Immunotherapy Candidate Status: Candidate for Immunotherapy Prior Immunotherapy Status: No Prior PD-1/PD-L1 Inhibitor Intent of Therapy: Non-Curative / Palliative Intent, Discussed with Patient

## 2017-09-10 NOTE — Telephone Encounter (Signed)
Gave patient avs report and appointments for April and May.

## 2017-09-10 NOTE — Progress Notes (Signed)
[place] Telephone:([phone]   Fax:(336) [phone]  OFFICE PROGRESS NOTE  [name], MD [place] [address]  DIAGNOSIS: Recurrent non-small cell lung cancer, adenocarcinoma initially diagnosed as stage IB (T2, N0, M0) in June 2008 with tumor size of 8.0 cm.  PRIOR THERAPY: #1 status post left upper lobectomy with lymph node dissection under the care of Dr. Arlyce Dice on 06/29/2006.  #2 status post 4 cycles of adjuvant chemotherapy with cisplatin and Taxotere last dose given 08/01/2006.  #3 status post 6 cycles of systemic chemotherapy with carboplatin and Alimta for disease recurrence last dose given 12/12/2009.  #4 palliative radiotherapy to the enlarging right upper lobe lung mass under the care of Dr. Pablo Ledger completed on 11/14/2015. #5 Maintenance systemic chemotherapy with single agent Alimta 400 MG/M2 every 3 weeks, status post 113 cycles.  CURRENT THERAPY: Second line treatment with immunotherapy with Nivolumab 480 mg IV every 4 weeks.  First dose 09/17/2017.  INTERVAL HISTORY: [name] 84 y.o. female returns to the clinic today for follow-up visit.  The patient is feeling fine today with no specific complaints except for the persistent dry cough as well as shortness of breath with exertion.  She denied having any chest pain or hemoptysis.  She denied having any fever or chills.  She has no nausea, vomiting, diarrhea or constipation.  The patient denied having any headache or visual changes.  She continues to tolerate her treatment with maintenance Alimta fairly well.  She had repeat CT scan of the chest performed recently and she is here for evaluation and discussion of her scan results and treatment options.   MEDICAL HISTORY: Past Medical History:  Diagnosis Date  . FHx: chemotherapy 2008&2011   4 cycles cisplatin,taxotere,2008& carboplatin and Alimta 2011  . History of migraine headaches   . Hypercholesterolemia   .  lung ca dx'd 06/2006   rt and lt lung  . Lung cancer ([place]) 3/14/1   right- Adenocarcinoma w/bronchioalveolar features  . Radiation 11/01/15-11/14/15   right upper lobe 30 gray  . Radiation pneumonitis ([place]) 01/24/2016    ALLERGIES:  is allergic to simvastatin.  MEDICATIONS:  Current Outpatient Medications  Medication Sig Dispense Refill  . albuterol (PROVENTIL HFA;VENTOLIN HFA) 108 (90 Base) MCG/ACT inhaler Inhale 1-2 puffs into the lungs every 6 (six) hours as needed for wheezing or shortness of breath. 1 Inhaler 2  . Calcium Carbonate-Vit D-Min (CALTRATE PLUS PO) Take 500 mg by mouth 2 (two) times daily. Reported on 12/13/2015    . chlorpheniramine-HYDROcodone (TUSSIONEX) 10-8 MG/5ML SUER Take 5 mLs by mouth 2 (two) times daily as needed for cough. 789 mL 0  . folic acid (FOLVITE) 1 MG tablet Take 1 mg by mouth daily. Reported on 12/13/2015    . Multiple Vitamins-Minerals (CENTRUM SILVER PO) Take 1 tablet by mouth daily. Reported on 12/13/2015    . vitamin B-12 (CYANOCOBALAMIN) 500 MCG tablet Take 1,500 mcg by mouth daily. Reported on 12/13/2015    . prochlorperazine (COMPAZINE) 10 MG tablet Take 1 tablet (10 mg total) by mouth every 6 (six) hours as needed. (Patient not taking: Reported on 04/16/2017) 15 tablet 1   No current facility-administered medications for this visit.     SURGICAL HISTORY:  Past Surgical History:  Procedure Laterality Date  . CATARACT EXTRACTION  2013   bilateral  . left  lowerlung lobectomy Left 06/19/2006   Dr.Burney,Left lower lobectomy  . Porta-cath  2011    REVIEW  OF SYSTEMS:  Constitutional: positive for fatigue Eyes: negative Ears, nose, mouth, throat, and face: negative Respiratory: positive for cough and dyspnea on exertion Cardiovascular: negative Gastrointestinal: negative Genitourinary:negative Integument/breast: negative Hematologic/lymphatic: negative Musculoskeletal:negative Neurological: negative Behavioral/Psych: negative Endocrine:  negative Allergic/Immunologic: negative   PHYSICAL EXAMINATION: General appearance: alert, cooperative and no distress Head: Normocephalic, without obvious abnormality, atraumatic Neck: no adenopathy, no JVD, supple, symmetrical, trachea midline and thyroid not enlarged, symmetric, no tenderness/mass/nodules Lymph nodes: Cervical, supraclavicular, and axillary nodes normal. Resp: wheezes bilaterally Back: symmetric, no curvature. ROM normal. No CVA tenderness. Cardio: regular rate and rhythm, S1, S2 normal, no murmur, click, rub or gallop and normal apical impulse GI: soft, non-tender; bowel sounds normal; no masses,  no organomegaly Extremities: extremities normal, atraumatic, no cyanosis or edema Neurologic: Alert and oriented X 3, normal strength and tone. Normal symmetric reflexes. Normal coordination and gait  ECOG PERFORMANCE STATUS: 1 - Symptomatic but completely ambulatory  Blood pressure 124/69, pulse 98, temperature 98.3 F (36.8 C), temperature source Oral, resp. rate 18, height 5\' 2"  (1.575 m), weight 121 lb 12.8 oz (55.2 kg), SpO2 99 %.  LABORATORY DATA: Lab Results  Component Value Date   WBC 4.6 09/10/2017   HGB 12.4 08/20/2017   HCT 37.6 09/10/2017   MCV 98.0 09/10/2017   PLT 339 09/10/2017      Chemistry      Component Value Date/Time   NA 139 09/10/2017 1025   NA 142 05/28/2017 1015   K 4.5 09/10/2017 1025   K 4.2 05/28/2017 1015   CL 107 09/10/2017 1025   CL 108 (H) 11/04/2012 0947   CO2 24 09/10/2017 1025   CO2 23 05/28/2017 1015   BUN 13 09/10/2017 1025   BUN 14.2 05/28/2017 1015   CREATININE 0.90 09/10/2017 1025   CREATININE 0.9 05/28/2017 1015      Component Value Date/Time   CALCIUM 9.2 09/10/2017 1025   CALCIUM 9.0 05/28/2017 1015   ALKPHOS 96 09/10/2017 1025   ALKPHOS 84 05/28/2017 1015   AST 22 09/10/2017 1025   AST 19 05/28/2017 1015   ALT 9 09/10/2017 1025   ALT 7 05/28/2017 1015   BILITOT 0.4 09/10/2017 1025   BILITOT 0.54  05/28/2017 1015       RADIOGRAPHIC STUDIES: Ct Chest Wo Contrast  Result Date: 09/04/2017 CLINICAL DATA:  Recurrent lung adenocarcinoma. Initial left lower lobe lung adenocarcinoma in 2008 status post left lower lobectomy. Palliative radiation therapy to the dominant right upper lobe lung mass completed 11/14/2015. Ongoing chemotherapy. Restaging. EXAM: CT CHEST WITHOUT CONTRAST TECHNIQUE: Multidetector CT imaging of the chest was performed following the standard protocol without IV contrast. COMPARISON:  05/23/2017 chest CT. FINDINGS: Cardiovascular: Normal heart size. No significant pericardial fluid/thickening. Three-vessel coronary atherosclerosis. Right internal jugular MediPort terminates in the lower third of the SVC. Atherosclerotic nonaneurysmal thoracic aorta. Normal caliber pulmonary arteries. Mediastinum/Nodes: No discrete thyroid nodules. Unremarkable esophagus. No axillary adenopathy. No pathologically enlarged mediastinal or discrete hilar nodes on this noncontrast scan. Lungs/Pleura: Left lower lobectomy. No pneumothorax. Trace dependent basilar left pleural effusion. No right pleural effusion. There are innumerable similar appearing mixed solid and subsolid pulmonary masses and nodules scattered throughout both lungs, all significantly increased in size and density since 05/23/2017 chest CT. For example, a 5.5 x 4.9 cm mixed solid and ground-glass basilar left upper lobe lung mass (series 5/image 105), significantly increased in size and density from 3.5 x 2.0 cm on 05/23/2017. Peripheral basilar left lower lobe 2.4 cm sub solid nodule (  series 5/image 93), increased from 1.3 cm. Dependent right lower lobe 2.1 cm sub solid nodule (series 5/image 65), increased from 1.3 cm. Poorly marginated masslike mixed solid and ground-glass opacities surrounding regions of right upper perihilar fibrosis have significantly increased in size and density. For example a 6.0 x 4.8 cm sub solid masslike focus in  the posterior right upper lobe at the superior margin of the masslike perihilar fibrosis (series 5/image 36), increased from 4.5 x 4.0 cm. Upper abdomen: No acute abnormality. Musculoskeletal: No aggressive appearing focal osseous lesions. Stable chronic mild superior compression deformities in T11, T12 and L1 vertebral bodies. Mild thoracic spondylosis. IMPRESSION: 1. Innumerable similar-appearing mixed solid and subsolid pulmonary masses and pulmonary nodules scattered throughout both lungs, all significantly increased in size and density since 05/23/2017 chest CT. Findings are most likely to represent progression of widespread metastatic lung adenocarcinoma. 2. Three-vessel coronary atherosclerosis. Aortic Atherosclerosis (ICD10-I70.0). Electronically Signed   By: Ilona Sorrel M.D.   On: 09/04/2017 13:11    ASSESSMENT AND PLAN:  This is a very pleasant 84 years old white female with recurrent non-small cell lung cancer, adenocarcinoma status post induction systemic chemotherapy with carboplatin and Alimta with partial response. She is currently on maintenance treatment with single agent Alimta status post 113 cycles. The patient has been tolerating this treatment well with no concerning complaints. She had repeat CT scan of the chest performed recently.  I personally and independently reviewed the scan images and discussed the results with the patient today. Unfortunately her scan showed evidence for disease progression. I recommended for the patient to discontinue her current treatment with Alimta. I discussed with the patient other treatment options including palliative care versus consideration of treatment with immunotherapy with Nivolumab. She is interested in treatment with immunotherapy and she will start treatment with Nivolumab 480 mg IV every 4 weeks.  I discussed with the patient the adverse effect of this treatment including but not limited to immunotherapy mediated skin rash, diarrhea,  inflammation of the lung, kidney, liver, thyroid or other endocrine dysfunction. She is expected to start the first dose of this treatment next week. The patient will come back for follow-up visit in 5 weeks for evaluation before starting cycle #2. She was advised to call immediately if she has any concerning symptoms in the interval. The patient voices understanding of current disease status and treatment options and is in agreement with the current care plan. All questions were answered. The patient knows to call the clinic with any problems, questions or concerns. We can certainly see the patient much sooner if necessary.  Disclaimer: This note was dictated with voice recognition software. Similar sounding words can inadvertently be transcribed and may not be corrected upon review.

## 2017-09-17 ENCOUNTER — Inpatient Hospital Stay: Payer: Medicare Other

## 2017-09-17 VITALS — BP 120/68 | HR 80 | Temp 98.2°F | Resp 16

## 2017-09-17 DIAGNOSIS — C3492 Malignant neoplasm of unspecified part of left bronchus or lung: Principal | ICD-10-CM

## 2017-09-17 DIAGNOSIS — C3491 Malignant neoplasm of unspecified part of right bronchus or lung: Secondary | ICD-10-CM

## 2017-09-17 DIAGNOSIS — Z5112 Encounter for antineoplastic immunotherapy: Secondary | ICD-10-CM | POA: Diagnosis not present

## 2017-09-17 LAB — COMPREHENSIVE METABOLIC PANEL
ALBUMIN: 3.1 g/dL — AB (ref 3.5–5.0)
ALK PHOS: 103 U/L (ref 40–150)
ALT: 9 U/L (ref 0–55)
AST: 21 U/L (ref 5–34)
Anion gap: 6 (ref 3–11)
BILIRUBIN TOTAL: 0.4 mg/dL (ref 0.2–1.2)
BUN: 16 mg/dL (ref 7–26)
CALCIUM: 9.3 mg/dL (ref 8.4–10.4)
CO2: 28 mmol/L (ref 22–29)
CREATININE: 0.86 mg/dL (ref 0.60–1.10)
Chloride: 106 mmol/L (ref 98–109)
GFR calc Af Amer: >60 mL/min (ref >60)
GFR calc non Af Amer: >60 mL/min (ref >60)
GLUCOSE: 120 mg/dL (ref 70–140)
Potassium: 4.1 mmol/L (ref 3.5–5.1)
SODIUM: 140 mmol/L (ref 136–145)
TOTAL PROTEIN: 7.7 g/dL (ref 6.4–8.3)

## 2017-09-17 LAB — CBC WITH DIFFERENTIAL/PLATELET
BASOS ABS: 0 10*3/uL (ref 0.0–0.1)
BASOS PCT: 1 %
Eosinophils Absolute: 0.1 10*3/uL (ref 0.0–0.5)
Eosinophils Relative: 3 %
HEMATOCRIT: 37.1 % (ref 34.8–46.6)
HEMOGLOBIN: 12.4 g/dL (ref 11.6–15.9)
LYMPHS PCT: 33 %
Lymphs Abs: 1.7 10*3/uL (ref 0.9–3.3)
MCH: 32.6 pg (ref 25.1–34.0)
MCHC: 33.3 g/dL (ref 31.5–36.0)
MCV: 97.8 fL (ref 79.5–101.0)
MONOS PCT: 9 %
Monocytes Absolute: 0.5 10*3/uL (ref 0.1–0.9)
NEUTROS ABS: 2.8 10*3/uL (ref 1.5–6.5)
Neutrophils Relative %: 54 %
Platelets: 269 10*3/uL (ref 145–400)
RBC: 3.79 MIL/uL (ref 3.70–5.45)
RDW: 14.7 % — ABNORMAL HIGH (ref 11.2–14.5)
WBC: 5.1 10*3/uL (ref 3.9–10.3)

## 2017-09-17 MED ORDER — SODIUM CHLORIDE 0.9 % IV SOLN
480.0000 mg | Freq: Once | INTRAVENOUS | Status: AC
  Administered 2017-09-17: 480 mg via INTRAVENOUS
  Filled 2017-09-17: qty 48

## 2017-09-17 MED ORDER — SODIUM CHLORIDE 0.9% FLUSH
10.0000 mL | INTRAVENOUS | Status: DC | PRN
  Administered 2017-09-17: 10 mL
  Filled 2017-09-17: qty 10

## 2017-09-17 MED ORDER — HEPARIN SOD (PORK) LOCK FLUSH 100 UNIT/ML IV SOLN
500.0000 [IU] | Freq: Once | INTRAVENOUS | Status: AC | PRN
  Administered 2017-09-17: 500 [IU]
  Filled 2017-09-17: qty 5

## 2017-09-17 MED ORDER — SODIUM CHLORIDE 0.9 % IV SOLN
Freq: Once | INTRAVENOUS | Status: AC
  Administered 2017-09-17: 03:00:00 via INTRAVENOUS

## 2017-09-17 NOTE — Patient Instructions (Addendum)
[facility] Discharge Instructions for Patients Receiving Chemotherapy  Today you received the following chemotherapy agents Nivolumab  To help prevent nausea and vomiting after your treatment, we encourage you to take your nausea medication as directed   If you develop nausea and vomiting that is not controlled by your nausea medication, call the clinic.   BELOW ARE SYMPTOMS THAT SHOULD BE REPORTED IMMEDIATELY:  *FEVER GREATER THAN 100.5 F  *CHILLS WITH OR WITHOUT FEVER  NAUSEA AND VOMITING THAT IS NOT CONTROLLED WITH YOUR NAUSEA MEDICATION  *UNUSUAL SHORTNESS OF BREATH  *UNUSUAL BRUISING OR BLEEDING  TENDERNESS IN MOUTH AND THROAT WITH OR WITHOUT PRESENCE OF ULCERS  *URINARY PROBLEMS  *BOWEL PROBLEMS  UNUSUAL RASH Items with * indicate a potential emergency and should be followed up as soon as possible.  Feel free to call the clinic should you have any questions or concerns. The clinic phone number is [phone].  Please show the [facility] at check-in to the Emergency Department and triage nurse.    Nivolumab injection What is this medicine? NIVOLUMAB (nye VOL ue mab) is a monoclonal antibody. It is used to treat melanoma, lung cancer, kidney cancer, head and neck cancer, Hodgkin lymphoma, urothelial cancer, colon cancer, and liver cancer. This medicine may be used for other purposes; ask your health care provider or pharmacist if you have questions. COMMON BRAND NAME(S): Opdivo What should I tell my health care provider before I take this medicine? They need to know if you have any of these conditions: -diabetes -immune system problems -kidney disease -liver disease -lung disease -organ transplant -stomach or intestine problems -thyroid disease -an unusual or allergic reaction to nivolumab, other medicines, foods, dyes, or preservatives -pregnant or trying to get pregnant -breast-feeding How should I use this medicine? This medicine  is for infusion into a vein. It is given by a health care professional in a hospital or clinic setting. A special MedGuide will be given to you before each treatment. Be sure to read this information carefully each time. Talk to your pediatrician regarding the use of this medicine in children. While this drug may be prescribed for children as young as 12 years for selected conditions, precautions do apply. Overdosage: If you think you have taken too much of this medicine contact a poison control center or emergency room at once. NOTE: This medicine is only for you. Do not share this medicine with others. What if I miss a dose? It is important not to miss your dose. Call your doctor or health care professional if you are unable to keep an appointment. What may interact with this medicine? Interactions have not been studied. Give your health care provider a list of all the medicines, herbs, non-prescription drugs, or dietary supplements you use. Also tell them if you smoke, drink alcohol, or use illegal drugs. Some items may interact with your medicine. This list may not describe all possible interactions. Give your health care provider a list of all the medicines, herbs, non-prescription drugs, or dietary supplements you use. Also tell them if you smoke, drink alcohol, or use illegal drugs. Some items may interact with your medicine. What should I watch for while using this medicine? This drug may make you feel generally unwell. Continue your course of treatment even though you feel ill unless your doctor tells you to stop. You may need blood work done while you are taking this medicine. Do not become pregnant while taking this medicine or for 5 months  after stopping it. Women should inform their doctor if they wish to become pregnant or think they might be pregnant. There is a potential for serious side effects to an unborn child. Talk to your health care professional or pharmacist for more  information. Do not breast-feed an infant while taking this medicine. What side effects may I notice from receiving this medicine? Side effects that you should report to your doctor or health care professional as soon as possible: -allergic reactions like skin rash, itching or hives, swelling of the face, lips, or tongue -black, tarry stools -blood in the urine -bloody or watery diarrhea -changes in vision -change in sex drive -changes in emotions or moods -chest pain -confusion -cough -decreased appetite -diarrhea -facial flushing -feeling faint or lightheaded -fever, chills -hair loss -hallucination, loss of contact with reality -headache -irritable -joint pain -loss of memory -muscle pain -muscle weakness -seizures -shortness of breath -signs and symptoms of high blood sugar such as dizziness; dry mouth; dry skin; fruity breath; nausea; stomach pain; increased hunger or thirst; increased urination -signs and symptoms of kidney injury like trouble passing urine or change in the amount of urine -signs and symptoms of liver injury like dark yellow or brown urine; general ill feeling or flu-like symptoms; light-colored stools; loss of appetite; nausea; right upper belly pain; unusually weak or tired; yellowing of the eyes or skin -stiff neck -swelling of the ankles, feet, hands -weight gain Side effects that usually do not require medical attention (report to your doctor or health care professional if they continue or are bothersome): -bone pain -constipation -tiredness -vomiting This list may not describe all possible side effects. Call your doctor for medical advice about side effects. You may report side effects to FDA at 1-800-FDA-1088. Where should I keep my medicine? This drug is given in a hospital or clinic and will not be stored at home. NOTE: This sheet is a summary. It may not cover all possible information. If you have questions about this medicine, talk to your  doctor, pharmacist, or health care provider.  2018 Elsevier/Gold Standard (2016-02-26 17:49:34)

## 2017-10-01 ENCOUNTER — Other Ambulatory Visit: Payer: Medicare Other

## 2017-10-01 ENCOUNTER — Ambulatory Visit: Payer: Medicare Other

## 2017-10-01 ENCOUNTER — Ambulatory Visit: Payer: Medicare Other | Admitting: Internal Medicine

## 2017-10-09 MED ORDER — ACETAMINOPHEN 325 MG PO TABS
ORAL_TABLET | ORAL | Status: AC
  Filled 2017-10-09: qty 2

## 2017-10-15 ENCOUNTER — Inpatient Hospital Stay: Payer: Medicare Other | Attending: Internal Medicine | Admitting: Internal Medicine

## 2017-10-15 ENCOUNTER — Telehealth: Payer: Self-pay | Admitting: Internal Medicine

## 2017-10-15 ENCOUNTER — Inpatient Hospital Stay: Payer: Medicare Other

## 2017-10-15 ENCOUNTER — Encounter: Payer: Self-pay | Admitting: Internal Medicine

## 2017-10-15 VITALS — BP 132/73 | HR 86 | Temp 97.9°F | Resp 17 | Ht 62.0 in | Wt 120.6 lb

## 2017-10-15 DIAGNOSIS — C3411 Malignant neoplasm of upper lobe, right bronchus or lung: Secondary | ICD-10-CM | POA: Insufficient documentation

## 2017-10-15 DIAGNOSIS — C3492 Malignant neoplasm of unspecified part of left bronchus or lung: Secondary | ICD-10-CM

## 2017-10-15 DIAGNOSIS — Z5112 Encounter for antineoplastic immunotherapy: Secondary | ICD-10-CM | POA: Diagnosis present

## 2017-10-15 DIAGNOSIS — C3491 Malignant neoplasm of unspecified part of right bronchus or lung: Secondary | ICD-10-CM

## 2017-10-15 DIAGNOSIS — Z79899 Other long term (current) drug therapy: Secondary | ICD-10-CM | POA: Diagnosis not present

## 2017-10-15 LAB — COMPREHENSIVE METABOLIC PANEL
ALBUMIN: 3.4 g/dL — AB (ref 3.5–5.0)
ALK PHOS: 101 U/L (ref 40–150)
ALT: 10 U/L (ref 0–55)
AST: 20 U/L (ref 5–34)
Anion gap: 5 (ref 3–11)
BUN: 12 mg/dL (ref 7–26)
CALCIUM: 9.3 mg/dL (ref 8.4–10.4)
CO2: 26 mmol/L (ref 22–29)
CREATININE: 0.87 mg/dL (ref 0.60–1.10)
Chloride: 108 mmol/L (ref 98–109)
GFR calc Af Amer: >60 mL/min (ref >60)
GFR calc non Af Amer: 60 mL/min — ABNORMAL LOW (ref >60)
GLUCOSE: 73 mg/dL (ref 70–140)
Potassium: 3.9 mmol/L (ref 3.5–5.1)
Sodium: 139 mmol/L (ref 136–145)
Total Bilirubin: 0.5 mg/dL (ref 0.2–1.2)
Total Protein: 7.9 g/dL (ref 6.4–8.3)

## 2017-10-15 LAB — CBC WITH DIFFERENTIAL/PLATELET
BASOS PCT: 1 %
Basophils Absolute: 0 10*3/uL (ref 0.0–0.1)
EOS PCT: 2 %
Eosinophils Absolute: 0.1 10*3/uL (ref 0.0–0.5)
HCT: 38.2 % (ref 34.8–46.6)
Hemoglobin: 12.6 g/dL (ref 11.6–15.9)
LYMPHS PCT: 35 %
Lymphs Abs: 1.4 10*3/uL (ref 0.9–3.3)
MCH: 32.1 pg (ref 25.1–34.0)
MCHC: 32.9 g/dL (ref 31.5–36.0)
MCV: 97.8 fL (ref 79.5–101.0)
MONO ABS: 0.4 10*3/uL (ref 0.1–0.9)
Monocytes Relative: 9 %
Neutro Abs: 2.1 10*3/uL (ref 1.5–6.5)
Neutrophils Relative %: 53 %
PLATELETS: 240 10*3/uL (ref 145–400)
RBC: 3.91 MIL/uL (ref 3.70–5.45)
RDW: 14.5 % (ref 11.2–14.5)
WBC: 4 10*3/uL (ref 3.9–10.3)

## 2017-10-15 LAB — TSH: TSH: 1.263 u[IU]/mL (ref 0.308–3.960)

## 2017-10-15 MED ORDER — HEPARIN SOD (PORK) LOCK FLUSH 100 UNIT/ML IV SOLN
500.0000 [IU] | Freq: Once | INTRAVENOUS | Status: AC | PRN
  Administered 2017-10-15: 500 [IU]
  Filled 2017-10-15: qty 5

## 2017-10-15 MED ORDER — SODIUM CHLORIDE 0.9 % IV SOLN
480.0000 mg | Freq: Once | INTRAVENOUS | Status: AC
  Administered 2017-10-15: 480 mg via INTRAVENOUS
  Filled 2017-10-15: qty 48

## 2017-10-15 MED ORDER — SODIUM CHLORIDE 0.9% FLUSH
10.0000 mL | INTRAVENOUS | Status: DC | PRN
  Administered 2017-10-15: 10 mL
  Filled 2017-10-15: qty 10

## 2017-10-15 MED ORDER — SODIUM CHLORIDE 0.9 % IV SOLN
Freq: Once | INTRAVENOUS | Status: AC
  Administered 2017-10-15: 11:00:00 via INTRAVENOUS

## 2017-10-15 NOTE — Progress Notes (Signed)
[place] Telephone:(336) [phone]   Fax:(336) [phone]  OFFICE PROGRESS NOTE  [name], MD [place] [address]  DIAGNOSIS: Recurrent non-small cell lung cancer, adenocarcinoma initially diagnosed as stage IB (T2, N0, M0) in June 2008 with tumor size of 8.0 cm.  PRIOR THERAPY: #1 status post left upper lobectomy with lymph node dissection under the care of Dr. Arlyce Dice on 06/29/2006.  #2 status post 4 cycles of adjuvant chemotherapy with cisplatin and Taxotere last dose given 08/01/2006.  #3 status post 6 cycles of systemic chemotherapy with carboplatin and Alimta for disease recurrence last dose given 12/12/2009.  #4 palliative radiotherapy to the enlarging right upper lobe lung mass under the care of Dr. Pablo Ledger completed on 11/14/2015. #5 Maintenance systemic chemotherapy with single agent Alimta 400 MG/M2 every 3 weeks, status post 113 cycles.  CURRENT THERAPY: Second line treatment with immunotherapy with Nivolumab 480 mg IV every 4 weeks.  First dose 09/17/2017.  Status post 1 cycle.  INTERVAL HISTORY: [name] 84 y.o. female returns to the clinic today for follow-up visit.  The patient tolerated the first cycle of her treatment with Nivolumab fairly well.  She denied having any chest pain but continues to have dry cough with no shortness breath or hemoptysis.  She denied having any recent weight loss or night sweats.  She has no nausea, vomiting, diarrhea or constipation.  She has no skin rash.  She is here today for evaluation before starting cycle #2.  MEDICAL HISTORY: Past Medical History:  Diagnosis Date  . FHx: chemotherapy 2008&2011   4 cycles cisplatin,taxotere,2008& carboplatin and Alimta 2011  . History of migraine headaches   . Hypercholesterolemia   . lung ca dx'd 06/2006   rt and lt lung  . Lung cancer ([place]) 3/14/1   right- Adenocarcinoma w/bronchioalveolar features  . Radiation 11/01/15-11/14/15   right upper  lobe 30 gray  . Radiation pneumonitis ([place]) 01/24/2016    ALLERGIES:  is allergic to simvastatin.  MEDICATIONS:  Current Outpatient Medications  Medication Sig Dispense Refill  . albuterol (PROVENTIL HFA;VENTOLIN HFA) 108 (90 Base) MCG/ACT inhaler Inhale 1-2 puffs into the lungs every 6 (six) hours as needed for wheezing or shortness of breath. 1 Inhaler 2  . Calcium Carbonate-Vit D-Min (CALTRATE PLUS PO) Take 500 mg by mouth 2 (two) times daily. Reported on 12/13/2015    . chlorpheniramine-HYDROcodone (TUSSIONEX) 10-8 MG/5ML SUER Take 5 mLs by mouth 2 (two) times daily as needed for cough. 785 mL 0  . folic acid (FOLVITE) 1 MG tablet Take 1 mg by mouth daily. Reported on 12/13/2015    . Multiple Vitamins-Minerals (CENTRUM SILVER PO) Take 1 tablet by mouth daily. Reported on 12/13/2015    . prochlorperazine (COMPAZINE) 10 MG tablet Take 1 tablet (10 mg total) by mouth every 6 (six) hours as needed. (Patient not taking: Reported on 04/16/2017) 15 tablet 1  . vitamin B-12 (CYANOCOBALAMIN) 500 MCG tablet Take 1,500 mcg by mouth daily. Reported on 12/13/2015     No current facility-administered medications for this visit.     SURGICAL HISTORY:  Past Surgical History:  Procedure Laterality Date  . CATARACT EXTRACTION  2013   bilateral  . left  lowerlung lobectomy Left 06/19/2006   Dr.Burney,Left lower lobectomy  . Porta-cath  2011    REVIEW OF SYSTEMS:  A comprehensive review of systems was negative except for: Respiratory: positive for cough   PHYSICAL EXAMINATION: General appearance: alert, cooperative and  no distress Head: Normocephalic, without obvious abnormality, atraumatic Neck: no adenopathy, no JVD, supple, symmetrical, trachea midline and thyroid not enlarged, symmetric, no tenderness/mass/nodules Lymph nodes: Cervical, supraclavicular, and axillary nodes normal. Resp: wheezes bilaterally Back: symmetric, no curvature. ROM normal. No CVA tenderness. Cardio: regular rate and  rhythm, S1, S2 normal, no murmur, click, rub or gallop and normal apical impulse GI: soft, non-tender; bowel sounds normal; no masses,  no organomegaly Extremities: extremities normal, atraumatic, no cyanosis or edema  ECOG PERFORMANCE STATUS: 1 - Symptomatic but completely ambulatory  Blood pressure 132/73, pulse 86, temperature 97.9 F (36.6 C), temperature source Oral, resp. rate 17, height 5\' 2"  (1.575 m), weight 120 lb 9.6 oz (54.7 kg), SpO2 100 %.  LABORATORY DATA: Lab Results  Component Value Date   WBC 4.0 10/15/2017   HGB 12.6 10/15/2017   HCT 38.2 10/15/2017   MCV 97.8 10/15/2017   PLT 240 10/15/2017      Chemistry      Component Value Date/Time   NA 140 09/17/2017 1349   NA 142 05/28/2017 1015   K 4.1 09/17/2017 1349   K 4.2 05/28/2017 1015   CL 106 09/17/2017 1349   CL 108 (H) 11/04/2012 0947   CO2 28 09/17/2017 1349   CO2 23 05/28/2017 1015   BUN 16 09/17/2017 1349   BUN 14.2 05/28/2017 1015   CREATININE 0.86 09/17/2017 1349   CREATININE 0.90 09/10/2017 1025   CREATININE 0.9 05/28/2017 1015      Component Value Date/Time   CALCIUM 9.3 09/17/2017 1349   CALCIUM 9.0 05/28/2017 1015   ALKPHOS 103 09/17/2017 1349   ALKPHOS 84 05/28/2017 1015   AST 21 09/17/2017 1349   AST 22 09/10/2017 1025   AST 19 05/28/2017 1015   ALT 9 09/17/2017 1349   ALT 9 09/10/2017 1025   ALT 7 05/28/2017 1015   BILITOT 0.4 09/17/2017 1349   BILITOT 0.4 09/10/2017 1025   BILITOT 0.54 05/28/2017 1015       RADIOGRAPHIC STUDIES: No results found.  ASSESSMENT AND PLAN:  This is a very pleasant 84 years old white female with recurrent non-small cell lung cancer, adenocarcinoma status post induction systemic chemotherapy with carboplatin and Alimta with partial response. She is currently on maintenance treatment with single agent Alimta status post 113 cycles. The patient has been tolerating this treatment well with no concerning complaints. Unfortunately restaging scan after  cycle 113 showed evidence for disease progression. The patient was a started on second line treatment with immunotherapy was Nivolumab 480 mg IV every 4 weeks status post 1 cycle.  She tolerated the first cycle of this treatment well. I recommended for her to proceed with cycle #2 today as a scheduled. I will see her back for follow-up visit in 4 weeks for evaluation before starting cycle #3. She was advised to call immediately if she has any concerning symptoms in the interval. The patient voices understanding of current disease status and treatment options and is in agreement with the current care plan. All questions were answered. The patient knows to call the clinic with any problems, questions or concerns. We can certainly see the patient much sooner if necessary.  Disclaimer: This note was dictated with voice recognition software. Similar sounding words can inadvertently be transcribed and may not be corrected upon review.

## 2017-10-15 NOTE — Telephone Encounter (Signed)
Scheduled appt per 5/15 los - Gave patient AVS and calender per los.

## 2017-10-15 NOTE — Patient Instructions (Signed)
[facility] Discharge Instructions for Patients Receiving Chemotherapy  Today you received the following chemotherapy agents: Nivolumab  To help prevent nausea and vomiting after your treatment, we encourage you to take your nausea medication as directed.    If you develop nausea and vomiting that is not controlled by your nausea medication, call the clinic.   BELOW ARE SYMPTOMS THAT SHOULD BE REPORTED IMMEDIATELY:  *FEVER GREATER THAN 100.5 F  *CHILLS WITH OR WITHOUT FEVER  NAUSEA AND VOMITING THAT IS NOT CONTROLLED WITH YOUR NAUSEA MEDICATION  *UNUSUAL SHORTNESS OF BREATH  *UNUSUAL BRUISING OR BLEEDING  TENDERNESS IN MOUTH AND THROAT WITH OR WITHOUT PRESENCE OF ULCERS  *URINARY PROBLEMS  *BOWEL PROBLEMS  UNUSUAL RASH Items with * indicate a potential emergency and should be followed up as soon as possible.  Feel free to call the clinic should you have any questions or concerns. The clinic phone number is [phone].  Please show the CHEMO ALERT CARD at check-in to the Emergency Department and triage nurse.   

## 2017-11-12 ENCOUNTER — Telehealth: Payer: Self-pay | Admitting: Oncology

## 2017-11-12 ENCOUNTER — Inpatient Hospital Stay: Payer: Medicare Other | Admitting: Oncology

## 2017-11-12 ENCOUNTER — Inpatient Hospital Stay: Payer: Medicare Other | Attending: Internal Medicine

## 2017-11-12 ENCOUNTER — Inpatient Hospital Stay: Payer: Medicare Other

## 2017-11-12 ENCOUNTER — Encounter: Payer: Self-pay | Admitting: Oncology

## 2017-11-12 VITALS — BP 152/68 | HR 79 | Temp 98.6°F | Resp 19 | Ht 62.0 in | Wt 122.4 lb

## 2017-11-12 DIAGNOSIS — C3491 Malignant neoplasm of unspecified part of right bronchus or lung: Secondary | ICD-10-CM

## 2017-11-12 DIAGNOSIS — C3492 Malignant neoplasm of unspecified part of left bronchus or lung: Principal | ICD-10-CM

## 2017-11-12 DIAGNOSIS — Z5112 Encounter for antineoplastic immunotherapy: Secondary | ICD-10-CM | POA: Diagnosis not present

## 2017-11-12 DIAGNOSIS — J40 Bronchitis, not specified as acute or chronic: Secondary | ICD-10-CM

## 2017-11-12 DIAGNOSIS — R059 Cough, unspecified: Secondary | ICD-10-CM

## 2017-11-12 DIAGNOSIS — R05 Cough: Secondary | ICD-10-CM

## 2017-11-12 DIAGNOSIS — C3411 Malignant neoplasm of upper lobe, right bronchus or lung: Secondary | ICD-10-CM | POA: Diagnosis not present

## 2017-11-12 DIAGNOSIS — J700 Acute pulmonary manifestations due to radiation: Secondary | ICD-10-CM

## 2017-11-12 DIAGNOSIS — Z79899 Other long term (current) drug therapy: Secondary | ICD-10-CM | POA: Diagnosis not present

## 2017-11-12 LAB — COMPREHENSIVE METABOLIC PANEL
ALT: <6 U/L (ref 0–55)
AST: 17 U/L (ref 5–34)
Albumin: 3.2 g/dL — ABNORMAL LOW (ref 3.5–5.0)
Alkaline Phosphatase: 99 U/L (ref 40–150)
Anion gap: 7 (ref 3–11)
BUN: 15 mg/dL (ref 7–26)
CALCIUM: 8.9 mg/dL (ref 8.4–10.4)
CO2: 27 mmol/L (ref 22–29)
CREATININE: 0.8 mg/dL (ref 0.60–1.10)
Chloride: 104 mmol/L (ref 98–109)
GFR calc Af Amer: >60 mL/min (ref >60)
GLUCOSE: 138 mg/dL (ref 70–140)
Potassium: 3.9 mmol/L (ref 3.5–5.1)
SODIUM: 138 mmol/L (ref 136–145)
TOTAL PROTEIN: 7.3 g/dL (ref 6.4–8.3)
Total Bilirubin: 0.3 mg/dL (ref 0.2–1.2)

## 2017-11-12 LAB — CBC WITH DIFFERENTIAL/PLATELET
Basophils Absolute: 0 10*3/uL (ref 0.0–0.1)
Basophils Relative: 1 %
Eosinophils Absolute: 0.1 10*3/uL (ref 0.0–0.5)
Eosinophils Relative: 2 %
HCT: 36.5 % (ref 34.8–46.6)
Hemoglobin: 12.1 g/dL (ref 11.6–15.9)
Lymphocytes Relative: 39 %
Lymphs Abs: 1.7 10*3/uL (ref 0.9–3.3)
MCH: 31.7 pg (ref 25.1–34.0)
MCHC: 33.1 g/dL (ref 31.5–36.0)
MCV: 95.7 fL (ref 79.5–101.0)
Monocytes Absolute: 0.4 10*3/uL (ref 0.1–0.9)
Monocytes Relative: 8 %
Neutro Abs: 2.2 10*3/uL (ref 1.5–6.5)
Neutrophils Relative %: 50 %
Platelets: 234 10*3/uL (ref 145–400)
RBC: 3.81 MIL/uL (ref 3.70–5.45)
RDW: 14.2 % (ref 11.2–14.5)
WBC: 4.4 10*3/uL (ref 3.9–10.3)

## 2017-11-12 MED ORDER — SODIUM CHLORIDE 0.9% FLUSH
10.0000 mL | INTRAVENOUS | Status: DC | PRN
  Administered 2017-11-12: 10 mL
  Filled 2017-11-12: qty 10

## 2017-11-12 MED ORDER — SODIUM CHLORIDE 0.9 % IV SOLN
Freq: Once | INTRAVENOUS | Status: AC
  Administered 2017-11-12: 16:00:00 via INTRAVENOUS

## 2017-11-12 MED ORDER — HEPARIN SOD (PORK) LOCK FLUSH 100 UNIT/ML IV SOLN
500.0000 [IU] | Freq: Once | INTRAVENOUS | Status: AC | PRN
  Administered 2017-11-12: 500 [IU]
  Filled 2017-11-12: qty 5

## 2017-11-12 MED ORDER — ALBUTEROL SULFATE HFA 108 (90 BASE) MCG/ACT IN AERS: 1.0000 | INHALATION_SPRAY | Freq: Four times a day (QID) | RESPIRATORY_TRACT | 2 refills | Status: DC | PRN

## 2017-11-12 MED ORDER — SODIUM CHLORIDE 0.9 % IV SOLN
480.0000 mg | Freq: Once | INTRAVENOUS | Status: AC
  Administered 2017-11-12: 480 mg via INTRAVENOUS
  Filled 2017-11-12: qty 48

## 2017-11-12 MED ORDER — HYDROCOD POLST-CPM POLST ER 10-8 MG/5ML PO SUER: 5.0000 mL | Freq: Two times a day (BID) | ORAL | 0 refills | Status: DC | PRN

## 2017-11-12 NOTE — Telephone Encounter (Signed)
Scheduled appt per 6/12 los - gave patient AVS calender per los.

## 2017-11-12 NOTE — Progress Notes (Signed)
[facility] OFFICE PROGRESS NOTE  [name], [place] [address]  DIAGNOSIS: Recurrent non-small cell lung cancer, adenocarcinoma initially diagnosed as stage IB (T2, N0, M0) in June 2008 with tumor size of 8.0 cm.  PRIOR THERAPY: #1 status post left upper lobectomy with lymph node dissection under the care of Dr. Arlyce Dice on 06/29/2006.  #2 status post 4 cycles of adjuvant chemotherapy with cisplatin and Taxotere last dose given 08/01/2006.  #3 status post 6 cycles of systemic chemotherapy with carboplatin and Alimta for disease recurrence last dose given 12/12/2009. #4 palliative radiotherapy to the enlarging right upper lobe lung mass under the care of Dr. Pablo Ledger completed on 11/14/2015. #5 Maintenance systemic chemotherapy with single agent Alimta 400 MG/M2 every 3 weeks, status post 113 cycles.  CURRENT THERAPY: Second line treatment with immunotherapy with Nivolumab 480 mg IV every 4 weeks.  First dose 09/17/2017.  Status post 2 cycles.  INTERVAL HISTORY: [name] 84 y.o. female returns for routine follow-up visit by herself.  The patient continues to tolerate nivolumab fairly well.  She is an ongoing nonproductive cough.  She uses albuterol and Tussionex to help control her cough.  She denies fevers and chills.  Denies chest pain, shortness breath, hemoptysis.  Denies nausea, vomiting, constipation, diarrhea.  Denies skin rashes.  Nuys recent weight loss or night sweats.  The patient is here for evaluation prior to starting cycle #3 of her treatment.  MEDICAL HISTORY: Past Medical History:  Diagnosis Date  . FHx: chemotherapy 2008&2011   4 cycles cisplatin,taxotere,2008& carboplatin and Alimta 2011  . History of migraine headaches   . Hypercholesterolemia   . lung ca dx'd 06/2006   rt and lt lung  . Lung cancer ([facility]) 3/14/1   right- Adenocarcinoma w/bronchioalveolar features  . Radiation 11/01/15-11/14/15   right upper lobe 30  gray  . Radiation pneumonitis ([facility]) 01/24/2016    ALLERGIES:  is allergic to simvastatin.  MEDICATIONS:  Current Outpatient Medications  Medication Sig Dispense Refill  . albuterol (PROVENTIL HFA;VENTOLIN HFA) 108 (90 Base) MCG/ACT inhaler Inhale 1-2 puffs into the lungs every 6 (six) hours as needed for wheezing or shortness of breath. 1 Inhaler 2  . Calcium Carbonate-Vit D-Min (CALTRATE PLUS PO) Take 500 mg by mouth 2 (two) times daily. Reported on 12/13/2015    . chlorpheniramine-HYDROcodone (TUSSIONEX) 10-8 MG/5ML SUER Take 5 mLs by mouth 2 (two) times daily as needed for cough. 140 mL 0  . Multiple Vitamins-Minerals (CENTRUM SILVER PO) Take 1 tablet by mouth daily. Reported on 12/13/2015    . prochlorperazine (COMPAZINE) 10 MG tablet Take 1 tablet (10 mg total) by mouth every 6 (six) hours as needed. 15 tablet 1  . vitamin B-12 (CYANOCOBALAMIN) 500 MCG tablet Take 1,500 mcg by mouth daily. Reported on 12/13/2015     No current facility-administered medications for this visit.    Facility-Administered Medications Ordered in Other Visits  Medication Dose Route Frequency Provider Last Rate Last Dose  . 0.9 %  sodium chloride infusion   Intravenous Once Curt Bears, MD      . heparin lock flush 100 unit/mL  500 Units Intracatheter Once PRN Curt Bears, MD      . nivolumab (OPDIVO) 480 mg in sodium chloride 0.9 % 100 mL chemo infusion  480 mg Intravenous Once Curt Bears, MD      . sodium chloride flush (NS) 0.9 % injection 10 mL  10 mL Intracatheter PRN Curt Bears, MD  SURGICAL HISTORY:  Past Surgical History:  Procedure Laterality Date  . CATARACT EXTRACTION  2013   bilateral  . left  lowerlung lobectomy Left 06/19/2006   Dr.Burney,Left lower lobectomy  . Porta-cath  2011    REVIEW OF SYSTEMS:   Review of Systems  Constitutional: Negative for appetite change, chills, fatigue, fever and unexpected weight change.  HENT:   Negative for mouth sores,  nosebleeds, sore throat and trouble swallowing.   Eyes: Negative for eye problems and icterus.  Respiratory: Negative for hemoptysis, shortness of breath and wheezing. Positive for nonproductive cough. Cardiovascular: Negative for chest pain and leg swelling.  Gastrointestinal: Negative for abdominal pain, constipation, diarrhea, nausea and vomiting.  Genitourinary: Negative for bladder incontinence, difficulty urinating, dysuria, frequency and hematuria.   Musculoskeletal: Negative for back pain, gait problem, neck pain and neck stiffness.  Skin: Negative for itching and rash.  Neurological: Negative for dizziness, extremity weakness, gait problem, headaches, light-headedness and seizures.  Hematological: Negative for adenopathy. Does not bruise/bleed easily.  Psychiatric/Behavioral: Negative for confusion, depression and sleep disturbance. The patient is not nervous/anxious.     PHYSICAL EXAMINATION:  Blood pressure (!) 152/68, pulse 79, temperature 98.6 F (37 C), temperature source Oral, resp. rate 19, height 5\' 2"  (1.575 m), weight 122 lb 6.4 oz (55.5 kg), SpO2 100 %.  ECOG PERFORMANCE STATUS: 1 - Symptomatic but completely ambulatory  Physical Exam  Constitutional: Oriented to person, place, and time and well-developed, well-nourished, and in no distress. No distress.  HENT:  Head: Normocephalic and atraumatic.  Mouth/Throat: Oropharynx is clear and moist. No oropharyngeal exudate.  Eyes: Conjunctivae are normal. Right eye exhibits no discharge. Left eye exhibits no discharge. No scleral icterus.  Neck: Normal range of motion. Neck supple.  Cardiovascular: Normal rate, regular rhythm, normal heart sounds and intact distal pulses.   Pulmonary/Chest: Effort normal and breath sounds normal. No respiratory distress. No wheezes. No rales.  Abdominal: Soft. Bowel sounds are normal. Exhibits no distension and no mass. There is no tenderness.  Musculoskeletal: Normal range of motion.  Exhibits no edema.  Lymphadenopathy:    No cervical adenopathy.  Neurological: Alert and oriented to person, place, and time. Exhibits normal muscle tone. Gait normal. Coordination normal.  Skin: Skin is warm and dry. No rash noted. Not diaphoretic. No erythema. No pallor.  Psychiatric: Mood, memory and judgment normal.  Vitals reviewed.  LABORATORY DATA: Lab Results  Component Value Date   WBC 4.4 11/12/2017   HGB 12.1 11/12/2017   HCT 36.5 11/12/2017   MCV 95.7 11/12/2017   PLT 234 11/12/2017      Chemistry      Component Value Date/Time   NA 138 11/12/2017 1414   NA 142 05/28/2017 1015   K 3.9 11/12/2017 1414   K 4.2 05/28/2017 1015   CL 104 11/12/2017 1414   CL 108 (H) 11/04/2012 0947   CO2 27 11/12/2017 1414   CO2 23 05/28/2017 1015   BUN 15 11/12/2017 1414   BUN 14.2 05/28/2017 1015   CREATININE 0.80 11/12/2017 1414   CREATININE 0.90 09/10/2017 1025   CREATININE 0.9 05/28/2017 1015      Component Value Date/Time   CALCIUM 8.9 11/12/2017 1414   CALCIUM 9.0 05/28/2017 1015   ALKPHOS 99 11/12/2017 1414   ALKPHOS 84 05/28/2017 1015   AST 17 11/12/2017 1414   AST 22 09/10/2017 1025   AST 19 05/28/2017 1015   ALT <6 11/12/2017 1414   ALT 9 09/10/2017 1025   ALT 7  05/28/2017 1015   BILITOT 0.3 11/12/2017 1414   BILITOT 0.4 09/10/2017 1025   BILITOT 0.54 05/28/2017 1015       RADIOGRAPHIC STUDIES:  No results found.   ASSESSMENT/PLAN:  Bilateral lung cancer [facility]) This is a very pleasant 84 year old white female with recurrent non-small cell lung cancer, adenocarcinoma status post induction systemic chemotherapy with carboplatin and Alimta with partial response. She is currently on maintenance treatment with single agent Alimta status post 113 cycles. The patient has been tolerating this treatment well with no concerning complaints. Unfortunately restaging scan after cycle 113 showed evidence for disease progression. The patient was a started on second  line treatment with immunotherapy was Nivolumab 480 mg IV every 4 weeks status post 2 cycles.  She is tolerating her treatment fairly well with no concerning complaints. Recommend for her to proceed with cycle 3 of her treatment today as scheduled.  She will have a restaging CT scan of the chest prior to her next visit.  She will follow-up in 4 weeks for evaluation prior to cycle #4 and to review her restaging CT scan results.  For her cough, I have refilled her albuterol and Tussionex today.  She was advised to call immediately if she has any concerning symptoms in the interval. The patient voices understanding of current disease status and treatment options and is in agreement with the current care plan. All questions were answered. The patient knows to call the clinic with any problems, questions or concerns. We can certainly see the patient much sooner if necessary.   Orders Placed This Encounter  Procedures  . CT CHEST WO CONTRAST    Standing Status:   Future    Standing Expiration Date:   11/13/2018    Order Specific Question:   Preferred imaging location?    Answer:   [facility]    Order Specific Question:   Radiology Contrast Protocol - do NOT remove file path    Answer:   [email or web address]    Order Specific Question:   Reason for Exam additional comments    Answer:   Lung cancer. Restaging.   Mikey Bussing, DNP, AGPCNP-BC, AOCNP 11/12/17

## 2017-11-12 NOTE — Assessment & Plan Note (Addendum)
This is a very pleasant 84 year old white female with recurrent non-small cell lung cancer, adenocarcinoma status post induction systemic chemotherapy with carboplatin and Alimta with partial response. She is currently on maintenance treatment with single agent Alimta status post 113 cycles. The patient has been tolerating this treatment well with no concerning complaints. Unfortunately restaging scan after cycle 113 showed evidence for disease progression. The patient was a started on second line treatment with immunotherapy was Nivolumab 480 mg IV every 4 weeks status post 2 cycles.  She is tolerating her treatment fairly well with no concerning complaints. Recommend for her to proceed with cycle 3 of her treatment today as scheduled.  She will have a restaging CT scan of the chest prior to her next visit.  She will follow-up in 4 weeks for evaluation prior to cycle #4 and to review her restaging CT scan results.  For her cough, I have refilled her albuterol and Tussionex today.  She was advised to call immediately if she has any concerning symptoms in the interval. The patient voices understanding of current disease status and treatment options and is in agreement with the current care plan. All questions were answered. The patient knows to call the clinic with any problems, questions or concerns. We can certainly see the patient much sooner if necessary.

## 2017-11-12 NOTE — Patient Instructions (Signed)
[facility] Discharge Instructions for Patients Receiving Chemotherapy  Today you received the following chemotherapy agents: Nivolumab  To help prevent nausea and vomiting after your treatment, we encourage you to take your nausea medication as directed.    If you develop nausea and vomiting that is not controlled by your nausea medication, call the clinic.   BELOW ARE SYMPTOMS THAT SHOULD BE REPORTED IMMEDIATELY:  *FEVER GREATER THAN 100.5 F  *CHILLS WITH OR WITHOUT FEVER  NAUSEA AND VOMITING THAT IS NOT CONTROLLED WITH YOUR NAUSEA MEDICATION  *UNUSUAL SHORTNESS OF BREATH  *UNUSUAL BRUISING OR BLEEDING  TENDERNESS IN MOUTH AND THROAT WITH OR WITHOUT PRESENCE OF ULCERS  *URINARY PROBLEMS  *BOWEL PROBLEMS  UNUSUAL RASH Items with * indicate a potential emergency and should be followed up as soon as possible.  Feel free to call the clinic should you have any questions or concerns. The clinic phone number is [phone].  Please show the CHEMO ALERT CARD at check-in to the Emergency Department and triage nurse.   

## 2017-12-08 ENCOUNTER — Encounter (HOSPITAL_COMMUNITY): Payer: Self-pay

## 2017-12-08 ENCOUNTER — Ambulatory Visit (HOSPITAL_COMMUNITY)
Admission: RE | Admit: 2017-12-08 | Discharge: 2017-12-08 | Disposition: A | Discharge status: Home / Self Care | Payer: Medicare Other | Source: Ambulatory Visit | Attending: Oncology | Admitting: Oncology

## 2017-12-08 DIAGNOSIS — I2510 Atherosclerotic heart disease of native coronary artery without angina pectoris: Secondary | ICD-10-CM | POA: Diagnosis not present

## 2017-12-08 DIAGNOSIS — J90 Pleural effusion, not elsewhere classified: Secondary | ICD-10-CM | POA: Diagnosis not present

## 2017-12-08 DIAGNOSIS — C3492 Malignant neoplasm of unspecified part of left bronchus or lung: Secondary | ICD-10-CM | POA: Diagnosis not present

## 2017-12-08 DIAGNOSIS — C3491 Malignant neoplasm of unspecified part of right bronchus or lung: Secondary | ICD-10-CM | POA: Insufficient documentation

## 2017-12-08 DIAGNOSIS — I700 Atherosclerosis of aorta: Secondary | ICD-10-CM | POA: Diagnosis not present

## 2017-12-08 DIAGNOSIS — R918 Other nonspecific abnormal finding of lung field: Secondary | ICD-10-CM | POA: Insufficient documentation

## 2017-12-10 ENCOUNTER — Encounter: Payer: Self-pay | Admitting: Oncology

## 2017-12-10 ENCOUNTER — Inpatient Hospital Stay: Payer: Medicare Other | Attending: Internal Medicine

## 2017-12-10 ENCOUNTER — Inpatient Hospital Stay: Payer: Medicare Other

## 2017-12-10 ENCOUNTER — Inpatient Hospital Stay: Payer: Medicare Other | Admitting: Oncology

## 2017-12-10 ENCOUNTER — Telehealth: Payer: Self-pay | Admitting: Oncology

## 2017-12-10 VITALS — BP 143/78 | HR 83 | Temp 98.2°F | Resp 18 | Ht 62.0 in | Wt 120.8 lb

## 2017-12-10 DIAGNOSIS — C3492 Malignant neoplasm of unspecified part of left bronchus or lung: Secondary | ICD-10-CM

## 2017-12-10 DIAGNOSIS — Z5112 Encounter for antineoplastic immunotherapy: Secondary | ICD-10-CM | POA: Diagnosis present

## 2017-12-10 DIAGNOSIS — C3491 Malignant neoplasm of unspecified part of right bronchus or lung: Secondary | ICD-10-CM

## 2017-12-10 DIAGNOSIS — C3411 Malignant neoplasm of upper lobe, right bronchus or lung: Secondary | ICD-10-CM

## 2017-12-10 DIAGNOSIS — Z95828 Presence of other vascular implants and grafts: Secondary | ICD-10-CM

## 2017-12-10 DIAGNOSIS — J700 Acute pulmonary manifestations due to radiation: Secondary | ICD-10-CM

## 2017-12-10 DIAGNOSIS — R05 Cough: Secondary | ICD-10-CM

## 2017-12-10 DIAGNOSIS — R059 Cough, unspecified: Secondary | ICD-10-CM

## 2017-12-10 LAB — CBC WITH DIFFERENTIAL/PLATELET
Basophils Absolute: 0 10*3/uL (ref 0.0–0.1)
Basophils Relative: 0 %
EOS ABS: 0.1 10*3/uL (ref 0.0–0.5)
Eosinophils Relative: 1 %
HCT: 36.4 % (ref 34.8–46.6)
HEMOGLOBIN: 12 g/dL (ref 11.6–15.9)
LYMPHS ABS: 2 10*3/uL (ref 0.9–3.3)
Lymphocytes Relative: 38 %
MCH: 31.2 pg (ref 25.1–34.0)
MCHC: 33 g/dL (ref 31.5–36.0)
MCV: 94.5 fL (ref 79.5–101.0)
Monocytes Absolute: 0.4 10*3/uL (ref 0.1–0.9)
Monocytes Relative: 9 %
NEUTROS PCT: 52 %
Neutro Abs: 2.7 10*3/uL (ref 1.5–6.5)
Platelets: 239 10*3/uL (ref 145–400)
RBC: 3.85 MIL/uL (ref 3.70–5.45)
RDW: 13.9 % (ref 11.2–14.5)
WBC: 5.1 10*3/uL (ref 3.9–10.3)

## 2017-12-10 LAB — COMPREHENSIVE METABOLIC PANEL
ALT: 8 U/L (ref 0–44)
AST: 17 U/L (ref 15–41)
Albumin: 3.2 g/dL — ABNORMAL LOW (ref 3.5–5.0)
Alkaline Phosphatase: 101 U/L (ref 38–126)
Anion gap: 7 (ref 5–15)
BUN: 15 mg/dL (ref 8–23)
CHLORIDE: 104 mmol/L (ref 98–111)
CO2: 26 mmol/L (ref 22–32)
CREATININE: 0.82 mg/dL (ref 0.44–1.00)
Calcium: 8.8 mg/dL — ABNORMAL LOW (ref 8.9–10.3)
Glucose, Bld: 141 mg/dL — ABNORMAL HIGH (ref 70–99)
POTASSIUM: 3.9 mmol/L (ref 3.5–5.1)
Sodium: 137 mmol/L (ref 135–145)
Total Bilirubin: 0.3 mg/dL (ref 0.3–1.2)
Total Protein: 7.4 g/dL (ref 6.5–8.1)

## 2017-12-10 MED ORDER — SODIUM CHLORIDE 0.9 % IV SOLN
Freq: Once | INTRAVENOUS | Status: AC
  Administered 2017-12-10: 16:00:00 via INTRAVENOUS

## 2017-12-10 MED ORDER — SODIUM CHLORIDE 0.9 % IV SOLN
480.0000 mg | Freq: Once | INTRAVENOUS | Status: AC
  Administered 2017-12-10: 480 mg via INTRAVENOUS
  Filled 2017-12-10: qty 48

## 2017-12-10 MED ORDER — HYDROCOD POLST-CPM POLST ER 10-8 MG/5ML PO SUER: 5.0000 mL | Freq: Two times a day (BID) | ORAL | 0 refills | Status: DC | PRN

## 2017-12-10 MED ORDER — SODIUM CHLORIDE 0.9% FLUSH
10.0000 mL | INTRAVENOUS | Status: DC | PRN
  Administered 2017-12-10: 10 mL
  Filled 2017-12-10: qty 10

## 2017-12-10 MED ORDER — HEPARIN SOD (PORK) LOCK FLUSH 100 UNIT/ML IV SOLN
500.0000 [IU] | Freq: Once | INTRAVENOUS | Status: AC | PRN
  Administered 2017-12-10: 500 [IU]
  Filled 2017-12-10: qty 5

## 2017-12-10 NOTE — Progress Notes (Signed)
[place] OFFICE PROGRESS NOTE  [name], [place] [address]  DIAGNOSIS: Recurrent non-small cell lung cancer, adenocarcinoma initially diagnosed as stage IB (T2, N0, M0) in June 2008 with tumor size of 8.0 cm.  PRIOR THERAPY: #1 status post left upper lobectomy with lymph node dissection under the care of Dr. Arlyce Dice on 06/29/2006.  #2 status post 4 cycles of adjuvant chemotherapy with cisplatin and Taxotere last dose given 08/01/2006.  #3 status post 6 cycles of systemic chemotherapy with carboplatin and Alimta for disease recurrence last dose given 12/12/2009. #4 palliative radiotherapy to the enlarging right upper lobe lung mass under the care of Dr. Pablo Ledger completed on 11/14/2015. #5 Maintenance systemic chemotherapy with single agent Alimta 400 MG/M2 every 3 weeks, status post 113 cycles.  CURRENT THERAPY: Second line treatment with immunotherapy with Nivolumab 480 mg IV every 4 weeks. First dose 09/17/2017.Status post 3 cycles.  INTERVAL HISTORY: [name] 84 y.o. female returns for routine follow-up visit by herself.  The patient is feeling fine today has no specific complaints except for an ongoing cough and myalgias in her arms in the morning.  The patient takes Tussionex for her cough which helps.  She reports that her myalgias resolved when she gets her arms moving.  She has not needed any pain medication for this.  The patient denies fevers and chills.  Denies chest pain, shortness of breath, hemoptysis.  Denies nausea, vomiting, constipation, diarrhea.  Denies skin rashes.  The patient denies recent weight loss or night sweats.  She had a recent restaging CT scan is here to discuss the results.  MEDICAL HISTORY: Past Medical History:  Diagnosis Date  . FHx: chemotherapy 2008&2011   4 cycles cisplatin,taxotere,2008& carboplatin and Alimta 2011  . History of migraine headaches   . Hypercholesterolemia   . lung ca  dx'd 06/2006   rt and lt lung  . Lung cancer ([facility]) 3/14/1   right- Adenocarcinoma w/bronchioalveolar features  . Radiation 11/01/15-11/14/15   right upper lobe 30 gray  . Radiation pneumonitis ([facility]) 01/24/2016    ALLERGIES:  is allergic to simvastatin.  MEDICATIONS:  Current Outpatient Medications  Medication Sig Dispense Refill  . albuterol (PROVENTIL HFA;VENTOLIN HFA) 108 (90 Base) MCG/ACT inhaler Inhale 1-2 puffs into the lungs every 6 (six) hours as needed for wheezing or shortness of breath. 1 Inhaler 2  . Calcium Carbonate-Vit D-Min (CALTRATE PLUS PO) Take 500 mg by mouth 2 (two) times daily. Reported on 12/13/2015    . chlorpheniramine-HYDROcodone (TUSSIONEX) 10-8 MG/5ML SUER Take 5 mLs by mouth 2 (two) times daily as needed for cough. 140 mL 0  . Multiple Vitamins-Minerals (CENTRUM SILVER PO) Take 1 tablet by mouth daily. Reported on 12/13/2015    . prochlorperazine (COMPAZINE) 10 MG tablet Take 1 tablet (10 mg total) by mouth every 6 (six) hours as needed. 15 tablet 1  . vitamin B-12 (CYANOCOBALAMIN) 500 MCG tablet Take 1,500 mcg by mouth daily. Reported on 12/13/2015     No current facility-administered medications for this visit.     SURGICAL HISTORY:  Past Surgical History:  Procedure Laterality Date  . CATARACT EXTRACTION  2013   bilateral  . left  lowerlung lobectomy Left 06/19/2006   Dr.Burney,Left lower lobectomy  . Porta-cath  2011    REVIEW OF SYSTEMS:   Review of Systems  Constitutional: Negative for appetite change, chills, fatigue, fever and unexpected weight change.  HENT:   Negative for mouth sores, nosebleeds, sore  throat and trouble swallowing.   Eyes: Negative for eye problems and icterus.  Respiratory: Negative for hemoptysis, shortness of breath and wheezing.  Positive for cough. Cardiovascular: Negative for chest pain and leg swelling.  Gastrointestinal: Negative for abdominal pain, constipation, diarrhea, nausea and vomiting.  Genitourinary: Negative  for bladder incontinence, difficulty urinating, dysuria, frequency and hematuria.   Musculoskeletal: Negative for back pain, gait problem, neck pain and neck stiffness.  Skin: Negative for itching and rash.  Neurological: Negative for dizziness, extremity weakness, gait problem, headaches, light-headedness and seizures.  Hematological: Negative for adenopathy. Does not bruise/bleed easily.  Psychiatric/Behavioral: Negative for confusion, depression and sleep disturbance. The patient is not nervous/anxious.     PHYSICAL EXAMINATION:  Blood pressure (!) 143/78, pulse 83, temperature 98.2 F (36.8 C), temperature source Oral, resp. rate 18, height 5\' 2"  (1.575 m), weight 120 lb 12.8 oz (54.8 kg), SpO2 99 %.  ECOG PERFORMANCE STATUS: 1 - Symptomatic but completely ambulatory  Physical Exam  Constitutional: Oriented to person, place, and time and well-developed, well-nourished, and in no distress. No distress.  HENT:  Head: Normocephalic and atraumatic.  Mouth/Throat: Oropharynx is clear and moist. No oropharyngeal exudate.  Eyes: Conjunctivae are normal. Right eye exhibits no discharge. Left eye exhibits no discharge. No scleral icterus.  Neck: Normal range of motion. Neck supple.  Cardiovascular: Normal rate, regular rhythm, normal heart sounds and intact distal pulses.   Pulmonary/Chest: Effort normal and breath sounds normal. No respiratory distress. No wheezes. No rales.  Abdominal: Soft. Bowel sounds are normal. Exhibits no distension and no mass. There is no tenderness.  Musculoskeletal: Normal range of motion. Exhibits no edema.  Lymphadenopathy:    No cervical adenopathy.  Neurological: Alert and oriented to person, place, and time. Exhibits normal muscle tone. Gait normal. Coordination normal.  Skin: Skin is warm and dry. No rash noted. Not diaphoretic. No erythema. No pallor.  Psychiatric: Mood, memory and judgment normal.  Vitals reviewed.  LABORATORY DATA: Lab Results   Component Value Date   WBC 5.1 12/10/2017   HGB 12.0 12/10/2017   HCT 36.4 12/10/2017   MCV 94.5 12/10/2017   PLT 239 12/10/2017      Chemistry      Component Value Date/Time   NA 137 12/10/2017 1237   NA 142 05/28/2017 1015   K 3.9 12/10/2017 1237   K 4.2 05/28/2017 1015   CL 104 12/10/2017 1237   CL 108 (H) 11/04/2012 0947   CO2 26 12/10/2017 1237   CO2 23 05/28/2017 1015   BUN 15 12/10/2017 1237   BUN 14.2 05/28/2017 1015   CREATININE 0.82 12/10/2017 1237   CREATININE 0.90 09/10/2017 1025   CREATININE 0.9 05/28/2017 1015      Component Value Date/Time   CALCIUM 8.8 (L) 12/10/2017 1237   CALCIUM 9.0 05/28/2017 1015   ALKPHOS 101 12/10/2017 1237   ALKPHOS 84 05/28/2017 1015   AST 17 12/10/2017 1237   AST 22 09/10/2017 1025   AST 19 05/28/2017 1015   ALT 8 12/10/2017 1237   ALT 9 09/10/2017 1025   ALT 7 05/28/2017 1015   BILITOT 0.3 12/10/2017 1237   BILITOT 0.4 09/10/2017 1025   BILITOT 0.54 05/28/2017 1015       RADIOGRAPHIC STUDIES:  Ct Chest Wo Contrast  Result Date: 12/08/2017 CLINICAL DATA:  Bilateral lung cancer. Restaging. Chronic cough with shortness of breath. Finished radiation therapy 12-13 years ago. History of radiation pneumonitis. EXAM: CT CHEST WITHOUT CONTRAST TECHNIQUE: Multidetector CT imaging of  the chest was performed following the standard protocol without IV contrast. COMPARISON:  09/04/2017 and 05/23/2017. FINDINGS: Cardiovascular: Right Port-A-Cath which terminates at the superior caval/atrial junction. Aortic and branch vessel atherosclerosis. Normal heart size, without pericardial effusion. Multivessel coronary artery atherosclerosis. Mediastinum/Nodes: No mediastinal adenopathy. Hilar regions poorly evaluated without intravenous contrast. Fluid level in the esophagus, including on image 37/2. Lungs/Pleura: Trace left pleural fluid persists. Left lower lobectomy. Central right upper lobe masslike consolidation measures 4.8 x 6.4 cm on 42/7.  This is relatively similar to 4.5 x 6.8 cm in greatest transverse dimension on the prior exam (when remeasured). Does demonstrate decreased air bronchograms today, suggesting progression. Right lower lobe 6 mm nodule on 86/7 has enlarged from 5 mm on the prior exam (when remeasured). Sub solid spiculated right lower lobe 9 mm nodule on 78/7 is similar in size to on the prior but appears more solid and well-defined today. Mixed attenuation more cephalad right lower lobe pulmonary nodule of 1.9 cm on 69/7 also appears more solid and well-defined today than on the prior exam, where it measures 1.7 cm, when remeasured. Progressive left upper lobe subpleural sub solid pulmonary nodules, with an index nodule or conglomeration of multiple nodules measuring 1.3 cm on 43/7 versus approximately 6 mm on the prior. Lingular solid nodule measures 2.5 x 2.6 cm on 86/7 versus 2.4 x 2.3 cm on the prior. Masslike left lower lobe consolidation measures 5.5 x 5.6 cm on 104/3 and is slightly more well-defined than at 5.5 x 4.9 cm on the prior. Upper Abdomen: Normal imaged portions of the liver, spleen, stomach, pancreas, adrenal glands, kidneys. Musculoskeletal: Osteopenia. Mild loss of intervertebral body height at T11-L1, similar. IMPRESSION: 1. Mild progression of disease, as evidenced by increased size and definition of multiple pulmonary opacities. Although some of these could represent chronic infection or aspiration, primarily felt to represent progressive metastatic disease. 2. Similar trace left pleural effusion. 3. Coronary artery atherosclerosis. Aortic Atherosclerosis (ICD10-I70.0). 4. Esophageal air fluid level suggests dysmotility or gastroesophageal reflux. Electronically Signed   By: Abigail Miyamoto M.D.   On: 12/08/2017 16:05     ASSESSMENT/PLAN:  Bilateral lung cancer [facility]) This is a very pleasant 84 year old white female with recurrent non-small cell lung cancer, adenocarcinoma status post induction systemic  chemotherapy with carboplatin and Alimta with partial response. She is currently on maintenance treatment with single agent Alimta status post 113 cycles. The patient has been tolerating this treatment well with no concerning complaints. Unfortunatelyrestaging scan after cycle 113showed evidence for disease progression. The patient was a started on second line treatment with immunotherapy was Nivolumab 480 mg IV every 4 weeks status post 3 cycles.  She is tolerating her treatment fairly well with no concerning complaints. The patient had a restaging CT scan and is here to discuss the results.  The patient was seen with Dr. Earlie Server.  CT scan results were discussed with the patient which show overall stable disease.  There are some areas that are slightly increased but not enough to change treatment.  Recommend for her to continue on Nivolumab 480 mg IV every 4 weeks.  She will proceed with cycle #4 today as scheduled. The patient will follow-up in 4 weeks for evaluation prior to cycle #5.  For her cough, she may continue Tussionex as needed.  I have provided a refill to her today.  She was advised to call immediately if she has any concerning symptoms in the interval. The patient voices understanding of current disease  status and treatment options and is in agreement with the current care plan. All questions were answered. The patient knows to call the clinic with any problems, questions or concerns. We can certainly see the patient much sooner if necessary.   No orders of the defined types were placed in this encounter.  Mikey Bussing, DNP, AGPCNP-BC, AOCNP 12/10/17  ADDENDUM: Hematology/Oncology Attending: I had a face-to-face encounter with the patient today.  I recommended her care plan.  This is a very pleasant 84 years old white female with recurrent non-small cell lung cancer, adenocarcinoma.  She was a started on induction systemic chemotherapy with carboplatin, and Alimta with  partial response followed by maintenance Alimta for 113 cycles. The patient had evidence for disease progression and she was started on treatment with immunotherapy was Nivolumab 480 mg every 4 weeks status post 3 cycles.  She has been tolerating this treatment well.  She continues to have dry cough from the radiation induced pneumonitis. She had repeat CT scan of the chest performed recently.  I personally and independently reviewed the scans and discussed the results with the patient today.  Her scan showed stable to slightly increased pulmonary nodules. I recommended for the patient to continue her current treatment with Nivolumab and she will proceed with cycle #4 today.  We will continue to monitor her closely on the upcoming scan for any further disease progression. For the cough she will continue her current treatment with Tussionex. The patient will come back for follow-up visit in 4 weeks for evaluation before the next cycle of her treatment. She was advised to call immediately if she has any concerning symptoms in the interval.  Disclaimer: This note was dictated with voice recognition software. Similar sounding words can inadvertently be transcribed and may be missed upon review. [name], MD 12/10/17

## 2017-12-10 NOTE — Telephone Encounter (Signed)
Scheduled appt per 7/10 los - pt to get an updated schedule next visit.

## 2017-12-10 NOTE — Patient Instructions (Signed)
[facility] Discharge Instructions for Patients Receiving Chemotherapy  Today you received the following chemotherapy agents: NIVOLUMAB.  To help prevent nausea and vomiting after your treatment, we encourage you to take your nausea medication as directed.   If you develop nausea and vomiting that is not controlled by your nausea medication, call the clinic.   BELOW ARE SYMPTOMS THAT SHOULD BE REPORTED IMMEDIATELY:  *FEVER GREATER THAN 100.5 F  *CHILLS WITH OR WITHOUT FEVER  NAUSEA AND VOMITING THAT IS NOT CONTROLLED WITH YOUR NAUSEA MEDICATION  *UNUSUAL SHORTNESS OF BREATH  *UNUSUAL BRUISING OR BLEEDING  TENDERNESS IN MOUTH AND THROAT WITH OR WITHOUT PRESENCE OF ULCERS  *URINARY PROBLEMS  *BOWEL PROBLEMS  UNUSUAL RASH Items with * indicate a potential emergency and should be followed up as soon as possible.  Feel free to call the clinic should you have any questions or concerns. The clinic phone number is [phone].  Please show the CHEMO ALERT CARD at check-in to the Emergency Department and triage nurse.   

## 2017-12-10 NOTE — Assessment & Plan Note (Addendum)
This is a very pleasant 84 year old white female with recurrent non-small cell lung cancer, adenocarcinoma status post induction systemic chemotherapy with carboplatin and Alimta with partial response. She is currently on maintenance treatment with single agent Alimta status post 113 cycles. The patient has been tolerating this treatment well with no concerning complaints. Unfortunatelyrestaging scan after cycle 113showed evidence for disease progression. The patient was a started on second line treatment with immunotherapy was Nivolumab 480 mg IV every 4 weeks status post 3 cycles.  She is tolerating her treatment fairly well with no concerning complaints. The patient had a restaging CT scan and is here to discuss the results.  The patient was seen with Dr. Earlie Server.  CT scan results were discussed with the patient which show overall stable disease.  There are some areas that are slightly increased but not enough to change treatment.  Recommend for her to continue on Nivolumab 480 mg IV every 4 weeks.  She will proceed with cycle #4 today as scheduled. The patient will follow-up in 4 weeks for evaluation prior to cycle #5.  For her cough, she may continue Tussionex as needed.  I have provided a refill to her today.  She was advised to call immediately if she has any concerning symptoms in the interval. The patient voices understanding of current disease status and treatment options and is in agreement with the current care plan. All questions were answered. The patient knows to call the clinic with any problems, questions or concerns. We can certainly see the patient much sooner if necessary.

## 2018-01-07 ENCOUNTER — Inpatient Hospital Stay: Payer: Medicare Other | Attending: Internal Medicine

## 2018-01-07 ENCOUNTER — Telehealth: Payer: Self-pay | Admitting: Internal Medicine

## 2018-01-07 ENCOUNTER — Inpatient Hospital Stay (HOSPITAL_BASED_OUTPATIENT_CLINIC_OR_DEPARTMENT_OTHER): Payer: Medicare Other | Admitting: Internal Medicine

## 2018-01-07 ENCOUNTER — Other Ambulatory Visit: Payer: Self-pay | Admitting: Medical Oncology

## 2018-01-07 ENCOUNTER — Encounter: Payer: Self-pay | Admitting: Internal Medicine

## 2018-01-07 ENCOUNTER — Inpatient Hospital Stay (HOSPITAL_BASED_OUTPATIENT_CLINIC_OR_DEPARTMENT_OTHER): Payer: Medicare Other

## 2018-01-07 ENCOUNTER — Inpatient Hospital Stay: Payer: Medicare Other

## 2018-01-07 VITALS — BP 145/77 | HR 86 | Temp 97.7°F | Resp 18 | Ht 62.0 in | Wt 120.4 lb

## 2018-01-07 DIAGNOSIS — C3491 Malignant neoplasm of unspecified part of right bronchus or lung: Secondary | ICD-10-CM

## 2018-01-07 DIAGNOSIS — C3492 Malignant neoplasm of unspecified part of left bronchus or lung: Secondary | ICD-10-CM

## 2018-01-07 DIAGNOSIS — Z79899 Other long term (current) drug therapy: Secondary | ICD-10-CM | POA: Diagnosis not present

## 2018-01-07 DIAGNOSIS — Z5112 Encounter for antineoplastic immunotherapy: Secondary | ICD-10-CM | POA: Insufficient documentation

## 2018-01-07 DIAGNOSIS — C3411 Malignant neoplasm of upper lobe, right bronchus or lung: Secondary | ICD-10-CM | POA: Diagnosis present

## 2018-01-07 DIAGNOSIS — R05 Cough: Secondary | ICD-10-CM | POA: Insufficient documentation

## 2018-01-07 DIAGNOSIS — Z95828 Presence of other vascular implants and grafts: Secondary | ICD-10-CM

## 2018-01-07 LAB — CBC WITH DIFFERENTIAL (CANCER CENTER ONLY)
BASOS ABS: 0 10*3/uL (ref 0.0–0.1)
Basophils Relative: 0 %
Eosinophils Absolute: 0.1 10*3/uL (ref 0.0–0.5)
Eosinophils Relative: 1 %
HEMATOCRIT: 36.5 % (ref 34.8–46.6)
HEMOGLOBIN: 11.9 g/dL (ref 11.6–15.9)
LYMPHS ABS: 1.6 10*3/uL (ref 0.9–3.3)
Lymphocytes Relative: 30 %
MCH: 30.7 pg (ref 25.1–34.0)
MCHC: 32.6 g/dL (ref 31.5–36.0)
MCV: 94.1 fL (ref 79.5–101.0)
Monocytes Absolute: 0.5 10*3/uL (ref 0.1–0.9)
Monocytes Relative: 10 %
NEUTROS ABS: 3.1 10*3/uL (ref 1.5–6.5)
NEUTROS PCT: 59 %
Platelet Count: 279 10*3/uL (ref 145–400)
RBC: 3.88 MIL/uL (ref 3.70–5.45)
RDW: 14.2 % (ref 11.2–14.5)
WBC Count: 5.3 10*3/uL (ref 3.9–10.3)

## 2018-01-07 LAB — CMP (CANCER CENTER ONLY)
ALBUMIN: 3.1 g/dL — AB (ref 3.5–5.0)
ALT: 7 U/L (ref 0–44)
ANION GAP: 8 (ref 5–15)
AST: 16 U/L (ref 15–41)
Alkaline Phosphatase: 100 U/L (ref 38–126)
BUN: 17 mg/dL (ref 8–23)
CHLORIDE: 106 mmol/L (ref 98–111)
CO2: 24 mmol/L (ref 22–32)
Calcium: 8.6 mg/dL — ABNORMAL LOW (ref 8.9–10.3)
Creatinine: 0.74 mg/dL (ref 0.44–1.00)
GFR, Est AFR Am: >60 mL/min (ref >60)
Glucose, Bld: 116 mg/dL — ABNORMAL HIGH (ref 70–99)
Potassium: 4.2 mmol/L (ref 3.5–5.1)
Sodium: 138 mmol/L (ref 135–145)
Total Bilirubin: 0.4 mg/dL (ref 0.3–1.2)
Total Protein: 7.4 g/dL (ref 6.5–8.1)

## 2018-01-07 LAB — TSH: TSH: 2.834 u[IU]/mL (ref 0.308–3.960)

## 2018-01-07 MED ORDER — SODIUM CHLORIDE 0.9% FLUSH
10.0000 mL | INTRAVENOUS | Status: DC | PRN
  Administered 2018-01-07: 10 mL
  Filled 2018-01-07: qty 10

## 2018-01-07 MED ORDER — HEPARIN SOD (PORK) LOCK FLUSH 100 UNIT/ML IV SOLN
500.0000 [IU] | Freq: Once | INTRAVENOUS | Status: AC | PRN
  Administered 2018-01-07: 500 [IU]
  Filled 2018-01-07: qty 5

## 2018-01-07 MED ORDER — SODIUM CHLORIDE 0.9 % IV SOLN
480.0000 mg | Freq: Once | INTRAVENOUS | Status: AC
  Administered 2018-01-07: 480 mg via INTRAVENOUS
  Filled 2018-01-07: qty 48

## 2018-01-07 MED ORDER — SODIUM CHLORIDE 0.9 % IV SOLN
Freq: Once | INTRAVENOUS | Status: AC
  Administered 2018-01-07: 13:00:00 via INTRAVENOUS
  Filled 2018-01-07: qty 250

## 2018-01-07 NOTE — Patient Instructions (Signed)
[facility] Discharge Instructions for Patients Receiving Chemotherapy  Today you received the following chemotherapy agents: NIVOLUMAB.  To help prevent nausea and vomiting after your treatment, we encourage you to take your nausea medication as directed.   If you develop nausea and vomiting that is not controlled by your nausea medication, call the clinic.   BELOW ARE SYMPTOMS THAT SHOULD BE REPORTED IMMEDIATELY:  *FEVER GREATER THAN 100.5 F  *CHILLS WITH OR WITHOUT FEVER  NAUSEA AND VOMITING THAT IS NOT CONTROLLED WITH YOUR NAUSEA MEDICATION  *UNUSUAL SHORTNESS OF BREATH  *UNUSUAL BRUISING OR BLEEDING  TENDERNESS IN MOUTH AND THROAT WITH OR WITHOUT PRESENCE OF ULCERS  *URINARY PROBLEMS  *BOWEL PROBLEMS  UNUSUAL RASH Items with * indicate a potential emergency and should be followed up as soon as possible.  Feel free to call the clinic should you have any questions or concerns. The clinic phone number is [phone].  Please show the CHEMO ALERT CARD at check-in to the Emergency Department and triage nurse.   

## 2018-01-07 NOTE — Progress Notes (Signed)
[name] [phone]   Fax:([phone]  OFFICE PROGRESS NOTE  [name], MD [place] [address]  DIAGNOSIS: Recurrent non-small cell lung cancer, adenocarcinoma initially diagnosed as stage IB (T2, N0, M0) in June 2008 with tumor size of 8.0 cm.  PRIOR THERAPY: #1 status post left upper lobectomy with lymph node dissection under the care of Dr. Arlyce Dice on 06/29/2006.  #2 status post 4 cycles of adjuvant chemotherapy with cisplatin and Taxotere last dose given 08/01/2006.  #3 status post 6 cycles of systemic chemotherapy with carboplatin and Alimta for disease recurrence last dose given 12/12/2009.  #4 palliative radiotherapy to the enlarging right upper lobe lung mass under the care of Dr. Pablo Ledger completed on 11/14/2015. #5 Maintenance systemic chemotherapy with single agent Alimta 400 MG/M2 every 3 weeks, status post 113 cycles.  CURRENT THERAPY: Second line treatment with immunotherapy with Nivolumab 480 mg IV every 4 weeks.  First dose 09/17/2017.  Status post 4 cycles.  INTERVAL HISTORY: [name] 84 y.o. female returns to the clinic today for follow-up visit.  The patient is feeling fine except for the persistent dry cough.  She also has some arthralgia especially in the morning improved with exercise.  She denied having any recent weight loss or night sweats.  She has no nausea, vomiting, diarrhea or constipation.  She denied having any skin rash.  She is here today for evaluation before starting cycle #5 of her treatment with immunotherapy.  MEDICAL HISTORY: Past Medical History:  Diagnosis Date  . FHx: chemotherapy 2008&2011   4 cycles cisplatin,taxotere,2008& carboplatin and Alimta 2011  . History of migraine headaches   . Hypercholesterolemia   . lung ca dx'd 06/2006   rt and lt lung  . Lung cancer ([place]) 3/14/1   right- Adenocarcinoma w/bronchioalveolar features  . Radiation 11/01/15-11/14/15   right  upper lobe 30 gray  . Radiation pneumonitis ([place]) 01/24/2016    ALLERGIES:  is allergic to simvastatin.  MEDICATIONS:  Current Outpatient Medications  Medication Sig Dispense Refill  . albuterol (PROVENTIL HFA;VENTOLIN HFA) 108 (90 Base) MCG/ACT inhaler Inhale 1-2 puffs into the lungs every 6 (six) hours as needed for wheezing or shortness of breath. 1 Inhaler 2  . Calcium Carbonate-Vit D-Min (CALTRATE PLUS PO) Take 500 mg by mouth 2 (two) times daily. Reported on 12/13/2015    . chlorpheniramine-HYDROcodone (TUSSIONEX) 10-8 MG/5ML SUER Take 5 mLs by mouth 2 (two) times daily as needed for cough. 140 mL 0  . Multiple Vitamins-Minerals (CENTRUM SILVER PO) Take 1 tablet by mouth daily. Reported on 12/13/2015    . prochlorperazine (COMPAZINE) 10 MG tablet Take 1 tablet (10 mg total) by mouth every 6 (six) hours as needed. 15 tablet 1  . vitamin B-12 (CYANOCOBALAMIN) 500 MCG tablet Take 1,500 mcg by mouth daily. Reported on 12/13/2015     No current facility-administered medications for this visit.     SURGICAL HISTORY:  Past Surgical History:  Procedure Laterality Date  . CATARACT EXTRACTION  2013   bilateral  . left  lowerlung lobectomy Left 06/19/2006   Dr.Burney,Left lower lobectomy  . Porta-cath  2011    REVIEW OF SYSTEMS:  A comprehensive review of systems was negative except for: Respiratory: positive for cough Musculoskeletal: positive for arthralgias   PHYSICAL EXAMINATION: General appearance: alert, cooperative and no distress Head: Normocephalic, without obvious abnormality, atraumatic Neck: no adenopathy, no JVD, supple, symmetrical, trachea midline and thyroid not enlarged, symmetric, no  tenderness/mass/nodules Lymph nodes: Cervical, supraclavicular, and axillary nodes normal. Resp: wheezes bilaterally Back: symmetric, no curvature. ROM normal. No CVA tenderness. Cardio: regular rate and rhythm, S1, S2 normal, no murmur, click, rub or gallop and normal apical impulse GI:  soft, non-tender; bowel sounds normal; no masses,  no organomegaly Extremities: extremities normal, atraumatic, no cyanosis or edema  ECOG PERFORMANCE STATUS: 1 - Symptomatic but completely ambulatory  Blood pressure (!) 145/77, pulse 86, temperature 97.7 F (36.5 C), temperature source Oral, resp. rate 18, height 5\' 2"  (1.575 m), weight 120 lb 6.4 oz (54.6 kg), SpO2 100 %.  LABORATORY DATA: Lab Results  Component Value Date   WBC 5.1 12/10/2017   HGB 12.0 12/10/2017   HCT 36.4 12/10/2017   MCV 94.5 12/10/2017   PLT 239 12/10/2017      Chemistry      Component Value Date/Time   NA 137 12/10/2017 1237   NA 142 05/28/2017 1015   K 3.9 12/10/2017 1237   K 4.2 05/28/2017 1015   CL 104 12/10/2017 1237   CL 108 (H) 11/04/2012 0947   CO2 26 12/10/2017 1237   CO2 23 05/28/2017 1015   BUN 15 12/10/2017 1237   BUN 14.2 05/28/2017 1015   CREATININE 0.82 12/10/2017 1237   CREATININE 0.90 09/10/2017 1025   CREATININE 0.9 05/28/2017 1015      Component Value Date/Time   CALCIUM 8.8 (L) 12/10/2017 1237   CALCIUM 9.0 05/28/2017 1015   ALKPHOS 101 12/10/2017 1237   ALKPHOS 84 05/28/2017 1015   AST 17 12/10/2017 1237   AST 22 09/10/2017 1025   AST 19 05/28/2017 1015   ALT 8 12/10/2017 1237   ALT 9 09/10/2017 1025   ALT 7 05/28/2017 1015   BILITOT 0.3 12/10/2017 1237   BILITOT 0.4 09/10/2017 1025   BILITOT 0.54 05/28/2017 1015       RADIOGRAPHIC STUDIES: Ct Chest Wo Contrast  Result Date: 12/08/2017 CLINICAL DATA:  Bilateral lung cancer. Restaging. Chronic cough with shortness of breath. Finished radiation therapy 12-13 years ago. History of radiation pneumonitis. EXAM: CT CHEST WITHOUT CONTRAST TECHNIQUE: Multidetector CT imaging of the chest was performed following the standard protocol without IV contrast. COMPARISON:  09/04/2017 and 05/23/2017. FINDINGS: Cardiovascular: Right Port-A-Cath which terminates at the superior caval/atrial junction. Aortic and branch vessel  atherosclerosis. Normal heart size, without pericardial effusion. Multivessel coronary artery atherosclerosis. Mediastinum/Nodes: No mediastinal adenopathy. Hilar regions poorly evaluated without intravenous contrast. Fluid level in the esophagus, including on image 37/2. Lungs/Pleura: Trace left pleural fluid persists. Left lower lobectomy. Central right upper lobe masslike consolidation measures 4.8 x 6.4 cm on 42/7. This is relatively similar to 4.5 x 6.8 cm in greatest transverse dimension on the prior exam (when remeasured). Does demonstrate decreased air bronchograms today, suggesting progression. Right lower lobe 6 mm nodule on 86/7 has enlarged from 5 mm on the prior exam (when remeasured). Sub solid spiculated right lower lobe 9 mm nodule on 78/7 is similar in size to on the prior but appears more solid and well-defined today. Mixed attenuation more cephalad right lower lobe pulmonary nodule of 1.9 cm on 69/7 also appears more solid and well-defined today than on the prior exam, where it measures 1.7 cm, when remeasured. Progressive left upper lobe subpleural sub solid pulmonary nodules, with an index nodule or conglomeration of multiple nodules measuring 1.3 cm on 43/7 versus approximately 6 mm on the prior. Lingular solid nodule measures 2.5 x 2.6 cm on 86/7 versus 2.4 x 2.3 cm  on the prior. Masslike left lower lobe consolidation measures 5.5 x 5.6 cm on 104/3 and is slightly more well-defined than at 5.5 x 4.9 cm on the prior. Upper Abdomen: Normal imaged portions of the liver, spleen, stomach, pancreas, adrenal glands, kidneys. Musculoskeletal: Osteopenia. Mild loss of intervertebral body height at T11-L1, similar. IMPRESSION: 1. Mild progression of disease, as evidenced by increased size and definition of multiple pulmonary opacities. Although some of these could represent chronic infection or aspiration, primarily felt to represent progressive metastatic disease. 2. Similar trace left pleural  effusion. 3. Coronary artery atherosclerosis. Aortic Atherosclerosis (ICD10-I70.0). 4. Esophageal air fluid level suggests dysmotility or gastroesophageal reflux. Electronically Signed   By: Abigail Miyamoto M.D.   On: 12/08/2017 16:05    ASSESSMENT AND PLAN:  This is a very pleasant 84 years old white female with recurrent non-small cell lung cancer, adenocarcinoma status post induction systemic chemotherapy with carboplatin and Alimta with partial response. She is currently on maintenance treatment with single agent Alimta status post 113 cycles. The patient has been tolerating this treatment well with no concerning complaints. Unfortunately restaging scan after cycle 113 showed evidence for disease progression. The patient was started on second line treatment with immunotherapy was Nivolumab 480 mg IV every 4 weeks status post 4 cycles. The patient is tolerating her treatment with immunotherapy fairly well. I recommended for her to proceed with cycle #5 today as scheduled. I will see her back for follow-up visit in 4 weeks for evaluation before starting cycle #6. For the dry cough she will continue her current treatment with Tussionex. She was advised to call immediately if she has any concerning symptoms in the interval. The patient voices understanding of current disease status and treatment options and is in agreement with the current care plan. All questions were answered. The patient knows to call the clinic with any problems, questions or concerns. We can certainly see the patient much sooner if necessary.  Disclaimer: This note was dictated with voice recognition software. Similar sounding words can inadvertently be transcribed and may not be corrected upon review.

## 2018-01-07 NOTE — Telephone Encounter (Signed)
Scheduled appt per 8/7 los - gave patient  AVS and calender los.

## 2018-02-03 ENCOUNTER — Other Ambulatory Visit: Payer: Self-pay | Admitting: *Deleted

## 2018-02-03 ENCOUNTER — Other Ambulatory Visit: Payer: Self-pay | Admitting: Medical Oncology

## 2018-02-03 DIAGNOSIS — C3492 Malignant neoplasm of unspecified part of left bronchus or lung: Principal | ICD-10-CM

## 2018-02-03 DIAGNOSIS — Z95828 Presence of other vascular implants and grafts: Secondary | ICD-10-CM

## 2018-02-03 DIAGNOSIS — C3491 Malignant neoplasm of unspecified part of right bronchus or lung: Secondary | ICD-10-CM

## 2018-02-04 ENCOUNTER — Inpatient Hospital Stay: Payer: Medicare Other

## 2018-02-04 ENCOUNTER — Inpatient Hospital Stay: Payer: Medicare Other | Attending: Internal Medicine

## 2018-02-04 ENCOUNTER — Encounter: Payer: Self-pay | Admitting: Medical

## 2018-02-04 ENCOUNTER — Inpatient Hospital Stay: Payer: Medicare Other | Admitting: Medical

## 2018-02-04 DIAGNOSIS — R05 Cough: Secondary | ICD-10-CM

## 2018-02-04 DIAGNOSIS — C3492 Malignant neoplasm of unspecified part of left bronchus or lung: Principal | ICD-10-CM

## 2018-02-04 DIAGNOSIS — C3411 Malignant neoplasm of upper lobe, right bronchus or lung: Secondary | ICD-10-CM

## 2018-02-04 DIAGNOSIS — C3491 Malignant neoplasm of unspecified part of right bronchus or lung: Secondary | ICD-10-CM

## 2018-02-04 DIAGNOSIS — Z5112 Encounter for antineoplastic immunotherapy: Secondary | ICD-10-CM | POA: Diagnosis present

## 2018-02-04 DIAGNOSIS — Z95828 Presence of other vascular implants and grafts: Secondary | ICD-10-CM

## 2018-02-04 DIAGNOSIS — R059 Cough, unspecified: Secondary | ICD-10-CM

## 2018-02-04 DIAGNOSIS — J700 Acute pulmonary manifestations due to radiation: Secondary | ICD-10-CM

## 2018-02-04 LAB — CMP (CANCER CENTER ONLY)
ALK PHOS: 105 U/L (ref 38–126)
ALT: 7 U/L (ref 0–44)
ANION GAP: 7 (ref 5–15)
AST: 16 U/L (ref 15–41)
Albumin: 3.1 g/dL — ABNORMAL LOW (ref 3.5–5.0)
BILIRUBIN TOTAL: 0.5 mg/dL (ref 0.3–1.2)
BUN: 15 mg/dL (ref 8–23)
CALCIUM: 8.9 mg/dL (ref 8.9–10.3)
CO2: 26 mmol/L (ref 22–32)
Chloride: 106 mmol/L (ref 98–111)
Creatinine: 0.8 mg/dL (ref 0.44–1.00)
Glucose, Bld: 111 mg/dL — ABNORMAL HIGH (ref 70–99)
Potassium: 4.2 mmol/L (ref 3.5–5.1)
SODIUM: 139 mmol/L (ref 135–145)
TOTAL PROTEIN: 7.6 g/dL (ref 6.5–8.1)

## 2018-02-04 LAB — CBC WITH DIFFERENTIAL (CANCER CENTER ONLY)
BASOS ABS: 0 10*3/uL (ref 0.0–0.1)
Basophils Relative: 0 %
EOS ABS: 0 10*3/uL (ref 0.0–0.5)
Eosinophils Relative: 1 %
HCT: 36.7 % (ref 34.8–46.6)
Hemoglobin: 12.2 g/dL (ref 11.6–15.9)
LYMPHS PCT: 26 %
Lymphs Abs: 1.1 10*3/uL (ref 0.9–3.3)
MCH: 31.2 pg (ref 25.1–34.0)
MCHC: 33.3 g/dL (ref 31.5–36.0)
MCV: 93.7 fL (ref 79.5–101.0)
MONO ABS: 0.4 10*3/uL (ref 0.1–0.9)
Monocytes Relative: 9 %
Neutro Abs: 2.8 10*3/uL (ref 1.5–6.5)
Neutrophils Relative %: 64 %
Platelet Count: 303 10*3/uL (ref 145–400)
RBC: 3.91 MIL/uL (ref 3.70–5.45)
RDW: 14.5 % (ref 11.2–14.5)
WBC Count: 4.4 10*3/uL (ref 3.9–10.3)

## 2018-02-04 MED ORDER — SODIUM CHLORIDE 0.9 % IV SOLN
480.0000 mg | Freq: Once | INTRAVENOUS | Status: AC
  Administered 2018-02-04: 480 mg via INTRAVENOUS
  Filled 2018-02-04: qty 48

## 2018-02-04 MED ORDER — HEPARIN SOD (PORK) LOCK FLUSH 100 UNIT/ML IV SOLN
500.0000 [IU] | Freq: Once | INTRAVENOUS | Status: DC | PRN
  Filled 2018-02-04: qty 5

## 2018-02-04 MED ORDER — HYDROCOD POLST-CPM POLST ER 10-8 MG/5ML PO SUER: 5.0000 mL | Freq: Two times a day (BID) | ORAL | 0 refills | Status: DC | PRN

## 2018-02-04 MED ORDER — SODIUM CHLORIDE 0.9 % IJ SOLN
10.0000 mL | Freq: Once | INTRAMUSCULAR | Status: AC
  Administered 2018-02-04: 10 mL
  Filled 2018-02-04: qty 10

## 2018-02-04 MED ORDER — SODIUM CHLORIDE 0.9 % IV SOLN
Freq: Once | INTRAVENOUS | Status: AC
  Administered 2018-02-04: 11:00:00 via INTRAVENOUS
  Filled 2018-02-04: qty 250

## 2018-02-04 MED ORDER — SODIUM CHLORIDE 0.9% FLUSH
10.0000 mL | INTRAVENOUS | Status: DC | PRN
  Filled 2018-02-04: qty 10

## 2018-02-04 NOTE — Patient Instructions (Signed)
[facility] Discharge Instructions for Patients Receiving Chemotherapy  Today you received the following chemotherapy agents: NIVOLUMAB.  To help prevent nausea and vomiting after your treatment, we encourage you to take your nausea medication as directed.   If you develop nausea and vomiting that is not controlled by your nausea medication, call the clinic.   BELOW ARE SYMPTOMS THAT SHOULD BE REPORTED IMMEDIATELY:  *FEVER GREATER THAN 100.5 F  *CHILLS WITH OR WITHOUT FEVER  NAUSEA AND VOMITING THAT IS NOT CONTROLLED WITH YOUR NAUSEA MEDICATION  *UNUSUAL SHORTNESS OF BREATH  *UNUSUAL BRUISING OR BLEEDING  TENDERNESS IN MOUTH AND THROAT WITH OR WITHOUT PRESENCE OF ULCERS  *URINARY PROBLEMS  *BOWEL PROBLEMS  UNUSUAL RASH Items with * indicate a potential emergency and should be followed up as soon as possible.  Feel free to call the clinic should you have any questions or concerns. The clinic phone number is [phone].  Please show the CHEMO ALERT CARD at check-in to the Emergency Department and triage nurse.   

## 2018-02-06 NOTE — Progress Notes (Signed)
[facility] [phone]   Fax:(336) [phone]  OFFICE PROGRESS NOTE  [name], MD [place] [address]  DIAGNOSIS: Recurrent non-small cell lung cancer, adenocarcinoma initially diagnosed as stage IB (T2, N0, M0) in June 2008 with tumor size of 8.0 cm.  PRIOR THERAPY: #1 status post left upper lobectomy with lymph node dissection under the care of Dr. Arlyce Dice on 06/29/2006.  #2 status post 4 cycles of adjuvant chemotherapy with cisplatin and Taxotere last dose given 08/01/2006.  #3 status post 6 cycles of systemic chemotherapy with carboplatin and Alimta for disease recurrence last dose given 12/12/2009.  #4 palliative radiotherapy to the enlarging right upper lobe lung mass under the care of Dr. Pablo Ledger completed on 11/14/2015. #5 Maintenance systemic chemotherapy with single agent Alimta 400 MG/M2 every 3 weeks, status post 113 cycles.  CURRENT THERAPY: Second line treatment with immunotherapy with Nivolumab 480 mg IV every 4 weeks.  First dose 09/17/2017.  Status post 4 cycles.  INTERVAL HISTORY: [name] is an 84 year old female with a history of a recurrent non-small cell lung cancer, adenocarcinoma who was initially diagnosed in June 2008.  She presents to the office today for continued therapy with nivolumab.  She is to receive cycle 6, day 1.  She continues to be managed by Dr. Fanny Bien. Mohamed.  The patient has a history of radiation pneumonitis and continues to have an ongoing cough.  She request a refill of Tussionex today.  She reports that her muscles are sore from her immunotherapy.  She states that exercise helps to relieve the soreness.  She continues to do well overall.  She is due for a restaging CT scan prior to her next visit with Dr. Julien Nordmann on 03/04/2018.  She is pleased with how well she is doing.  She denies fevers, chills, sweats, nausea, vomiting, constipation, diarrhea, or anorexia.  MEDICAL  HISTORY: Past Medical History:  Diagnosis Date  . FHx: chemotherapy 2008&2011   4 cycles cisplatin,taxotere,2008& carboplatin and Alimta 2011  . History of migraine headaches   . Hypercholesterolemia   . lung ca dx'd 06/2006   rt and lt lung  . Lung cancer ([place]) 3/14/1   right- Adenocarcinoma w/bronchioalveolar features  . Radiation 11/01/15-11/14/15   right upper lobe 30 gray  . Radiation pneumonitis ([place]) 01/24/2016    ALLERGIES:  is allergic to simvastatin.  MEDICATIONS:  Current Outpatient Medications  Medication Sig Dispense Refill  . albuterol (PROVENTIL HFA;VENTOLIN HFA) 108 (90 Base) MCG/ACT inhaler Inhale 1-2 puffs into the lungs every 6 (six) hours as needed for wheezing or shortness of breath. 1 Inhaler 2  . chlorpheniramine-HYDROcodone (TUSSIONEX) 10-8 MG/5ML SUER Take 5 mLs by mouth 2 (two) times daily as needed for cough. 140 mL 0  . Multiple Vitamins-Minerals (CENTRUM SILVER PO) Take 1 tablet by mouth daily. Reported on 12/13/2015    . vitamin C (ASCORBIC ACID) 500 MG tablet Take 500 mg by mouth daily.    . prochlorperazine (COMPAZINE) 10 MG tablet Take 1 tablet (10 mg total) by mouth every 6 (six) hours as needed. (Patient not taking: Reported on 01/07/2018) 15 tablet 1   No current facility-administered medications for this visit.     SURGICAL HISTORY:  Past Surgical History:  Procedure Laterality Date  . CATARACT EXTRACTION  2013   bilateral  . left  lowerlung lobectomy Left 06/19/2006   Dr.Burney,Left lower lobectomy  . Porta-cath  2011    Review of Systems  Constitutional: Positive for appetite change. Negative for chills, diaphoresis, fatigue and fever.  HENT: Negative for congestion, mouth sores, postnasal drip, rhinorrhea and sore throat.   Respiratory: Positive for cough. Negative for choking, shortness of breath and wheezing.   Cardiovascular: Negative for chest pain and palpitations.  Gastrointestinal: Negative for constipation, diarrhea, nausea and  vomiting.  Genitourinary: Negative for decreased urine volume, difficulty urinating and dysuria.  Neurological: Negative for headaches.    ECOG PERFORMANCE STATUS: 1 - Symptomatic but completely ambulatory   Physical Exam  Constitutional: No distress.  HENT:  Head: Normocephalic and atraumatic.  Right Ear: External ear normal.  Left Ear: External ear normal.  Mouth/Throat: Oropharynx is clear and moist. No oropharyngeal exudate.  Neck: Normal range of motion. Neck supple.  Cardiovascular: Normal rate, regular rhythm and normal heart sounds. Exam reveals no gallop and no friction rub.  No murmur heard. Pulmonary/Chest: Effort normal. No respiratory distress. She has no wheezes. She has no rales.  Decreased breath sounds throughout all lung fields.  Increased AP diameter.  Musculoskeletal: She exhibits no edema or deformity.  Increased AP diameter.  Lymphadenopathy:    She has no cervical adenopathy.  Neurological: She is alert. Coordination normal.  Skin: Skin is warm and dry. No rash noted. She is not diaphoretic. No erythema.  Psychiatric: She has a normal mood and affect. Her behavior is normal. Judgment and thought content normal.    Blood pressure 113/69, pulse 84, temperature 98.6 F (37 C), temperature source Oral, resp. rate 18, height 5\' 2"  (1.575 m), weight 119 lb 14.4 oz (54.4 kg), SpO2 100 %.  LABORATORY DATA: Lab Results  Component Value Date   WBC 4.4 02/04/2018   HGB 12.2 02/04/2018   HCT 36.7 02/04/2018   MCV 93.7 02/04/2018   PLT 303 02/04/2018      Chemistry      Component Value Date/Time   NA 139 02/04/2018 0941   NA 142 05/28/2017 1015   K 4.2 02/04/2018 0941   K 4.2 05/28/2017 1015   CL 106 02/04/2018 0941   CL 108 (H) 11/04/2012 0947   CO2 26 02/04/2018 0941   CO2 23 05/28/2017 1015   BUN 15 02/04/2018 0941   BUN 14.2 05/28/2017 1015   CREATININE 0.80 02/04/2018 0941   CREATININE 0.9 05/28/2017 1015      Component Value Date/Time    CALCIUM 8.9 02/04/2018 0941   CALCIUM 9.0 05/28/2017 1015   ALKPHOS 105 02/04/2018 0941   ALKPHOS 84 05/28/2017 1015   AST 16 02/04/2018 0941   AST 19 05/28/2017 1015   ALT 7 02/04/2018 0941   ALT 7 05/28/2017 1015   BILITOT 0.5 02/04/2018 0941   BILITOT 0.54 05/28/2017 1015       RADIOGRAPHIC STUDIES: No results found.  ASSESSMENT AND PLAN:   Mrs. Dykema presents to the office today for cycle 6, day 1 of nivolumab.   Her counts are stable.   The patient's review of systems is stable without any issues of concern.   We will continue with her of nivolumab at 480 mg IV every 4 weeks. A restaging CT scan without contrast has been ordered. She will return for follow-up with Dr. Julien Nordmann and for review of her restaging studies on 03/04/2018. Plans will be to continue nivolumab 480 mg IV every 4 weeks unless her scans would warrant a change in therapy. Ms. Tomasetti is given a refill of Tussionex for her ongoing nonproductive cough. Ms. Murphey has been encouraged to continue  her exercises which she reports help with her sore muscles.  She was advised to call immediately if she has any concerning symptoms in the interval. The patient voices understanding of current disease status and treatment options and is in agreement with the current care plan. All questions were answered. The patient knows to call the clinic with any problems, questions or concerns. We can certainly see the patient much sooner if necessary.

## 2018-02-24 ENCOUNTER — Ambulatory Visit (HOSPITAL_COMMUNITY)
Admission: RE | Admit: 2018-02-24 | Discharge: 2018-02-24 | Disposition: A | Discharge status: Home / Self Care | Payer: Medicare Other | Source: Ambulatory Visit | Attending: Medical | Admitting: Medical

## 2018-02-24 DIAGNOSIS — J90 Pleural effusion, not elsewhere classified: Secondary | ICD-10-CM | POA: Diagnosis not present

## 2018-02-24 DIAGNOSIS — I700 Atherosclerosis of aorta: Secondary | ICD-10-CM | POA: Diagnosis not present

## 2018-02-24 DIAGNOSIS — C3492 Malignant neoplasm of unspecified part of left bronchus or lung: Secondary | ICD-10-CM | POA: Insufficient documentation

## 2018-02-24 DIAGNOSIS — C3491 Malignant neoplasm of unspecified part of right bronchus or lung: Secondary | ICD-10-CM | POA: Diagnosis not present

## 2018-03-03 ENCOUNTER — Other Ambulatory Visit: Payer: Self-pay | Admitting: Medical Oncology

## 2018-03-03 DIAGNOSIS — C3491 Malignant neoplasm of unspecified part of right bronchus or lung: Secondary | ICD-10-CM

## 2018-03-03 DIAGNOSIS — C3492 Malignant neoplasm of unspecified part of left bronchus or lung: Principal | ICD-10-CM

## 2018-03-04 ENCOUNTER — Encounter: Payer: Self-pay | Admitting: Internal Medicine

## 2018-03-04 ENCOUNTER — Inpatient Hospital Stay: Payer: Medicare Other

## 2018-03-04 ENCOUNTER — Inpatient Hospital Stay: Payer: Medicare Other | Attending: Internal Medicine

## 2018-03-04 ENCOUNTER — Inpatient Hospital Stay: Payer: Medicare Other | Admitting: Internal Medicine

## 2018-03-04 VITALS — BP 134/66 | HR 84 | Temp 98.4°F | Resp 17 | Ht 62.0 in | Wt 120.3 lb

## 2018-03-04 DIAGNOSIS — R05 Cough: Secondary | ICD-10-CM | POA: Diagnosis not present

## 2018-03-04 DIAGNOSIS — Z95828 Presence of other vascular implants and grafts: Secondary | ICD-10-CM

## 2018-03-04 DIAGNOSIS — Z5112 Encounter for antineoplastic immunotherapy: Secondary | ICD-10-CM | POA: Insufficient documentation

## 2018-03-04 DIAGNOSIS — Z23 Encounter for immunization: Secondary | ICD-10-CM | POA: Diagnosis not present

## 2018-03-04 DIAGNOSIS — C3411 Malignant neoplasm of upper lobe, right bronchus or lung: Secondary | ICD-10-CM

## 2018-03-04 DIAGNOSIS — C3492 Malignant neoplasm of unspecified part of left bronchus or lung: Principal | ICD-10-CM

## 2018-03-04 DIAGNOSIS — J700 Acute pulmonary manifestations due to radiation: Secondary | ICD-10-CM

## 2018-03-04 DIAGNOSIS — C3491 Malignant neoplasm of unspecified part of right bronchus or lung: Secondary | ICD-10-CM

## 2018-03-04 LAB — CMP (CANCER CENTER ONLY)
ALK PHOS: 106 U/L (ref 38–126)
ALT: 7 U/L (ref 0–44)
AST: 17 U/L (ref 15–41)
Albumin: 3.1 g/dL — ABNORMAL LOW (ref 3.5–5.0)
Anion gap: 5 (ref 5–15)
BILIRUBIN TOTAL: 0.3 mg/dL (ref 0.3–1.2)
BUN: 15 mg/dL (ref 8–23)
CALCIUM: 8.6 mg/dL — AB (ref 8.9–10.3)
CHLORIDE: 106 mmol/L (ref 98–111)
CO2: 27 mmol/L (ref 22–32)
CREATININE: 0.77 mg/dL (ref 0.44–1.00)
GFR, Est AFR Am: >60 mL/min (ref >60)
Glucose, Bld: 104 mg/dL — ABNORMAL HIGH (ref 70–99)
Potassium: 4.2 mmol/L (ref 3.5–5.1)
Sodium: 138 mmol/L (ref 135–145)
Total Protein: 7.4 g/dL (ref 6.5–8.1)

## 2018-03-04 LAB — CBC WITH DIFFERENTIAL (CANCER CENTER ONLY)
Basophils Absolute: 0 10*3/uL (ref 0.0–0.1)
Basophils Relative: 0 %
EOS ABS: 0.1 10*3/uL (ref 0.0–0.5)
EOS PCT: 1 %
HCT: 36.7 % (ref 34.8–46.6)
Hemoglobin: 11.9 g/dL (ref 11.6–15.9)
LYMPHS ABS: 1.9 10*3/uL (ref 0.9–3.3)
Lymphocytes Relative: 31 %
MCH: 30.7 pg (ref 25.1–34.0)
MCHC: 32.4 g/dL (ref 31.5–36.0)
MCV: 94.8 fL (ref 79.5–101.0)
Monocytes Absolute: 0.5 10*3/uL (ref 0.1–0.9)
Monocytes Relative: 8 %
Neutro Abs: 3.5 10*3/uL (ref 1.5–6.5)
Neutrophils Relative %: 60 %
PLATELETS: 289 10*3/uL (ref 145–400)
RBC: 3.87 MIL/uL (ref 3.70–5.45)
RDW: 14.9 % — ABNORMAL HIGH (ref 11.2–14.5)
WBC: 5.9 10*3/uL (ref 3.9–10.3)

## 2018-03-04 MED ORDER — SODIUM CHLORIDE 0.9% FLUSH
10.0000 mL | INTRAVENOUS | Status: DC | PRN
  Administered 2018-03-04: 10 mL
  Filled 2018-03-04: qty 10

## 2018-03-04 MED ORDER — SODIUM CHLORIDE 0.9 % IV SOLN
Freq: Once | INTRAVENOUS | Status: AC
  Administered 2018-03-04: 13:00:00 via INTRAVENOUS
  Filled 2018-03-04: qty 250

## 2018-03-04 MED ORDER — HEPARIN SOD (PORK) LOCK FLUSH 100 UNIT/ML IV SOLN
500.0000 [IU] | Freq: Once | INTRAVENOUS | Status: AC | PRN
  Administered 2018-03-04: 500 [IU]
  Filled 2018-03-04: qty 5

## 2018-03-04 MED ORDER — SODIUM CHLORIDE 0.9 % IV SOLN
480.0000 mg | Freq: Once | INTRAVENOUS | Status: AC
  Administered 2018-03-04: 480 mg via INTRAVENOUS
  Filled 2018-03-04: qty 48

## 2018-03-04 NOTE — Progress Notes (Signed)
[place] [phone]   Fax:([phone]) [phone]  OFFICE PROGRESS NOTE  [name], MD [place] [address]  DIAGNOSIS: Recurrent non-small cell lung cancer, adenocarcinoma initially diagnosed as stage IB (T2, N0, M0) in June 2008 with tumor size of 8.0 cm.  PRIOR THERAPY: #1 status post left upper lobectomy with lymph node dissection under the care of Dr. Arlyce Dice on 06/29/2006.  #2 status post 4 cycles of adjuvant chemotherapy with cisplatin and Taxotere last dose given 08/01/2006.  #3 status post 6 cycles of systemic chemotherapy with carboplatin and Alimta for disease recurrence last dose given 12/12/2009.  #4 palliative radiotherapy to the enlarging right upper lobe lung mass under the care of Dr. Pablo Ledger completed on 11/14/2015. #5 Maintenance systemic chemotherapy with single agent Alimta 400 MG/M2 every 3 weeks, status post 113 cycles.  CURRENT THERAPY: Second line treatment with immunotherapy with Nivolumab 480 mg IV every 4 weeks.  First dose 09/17/2017.  Status post 6 cycles.  INTERVAL HISTORY: [name] 84 y.o. female returns to the clinic today for follow-up visit.  The patient is feeling fine today with no specific complaints except for mild fatigue and dry cough.  She takes her cough medication on as-needed basis.  She also has some arthralgia probably secondary to her treatment with Nivolumab.  She denied having any chest pain, shortness of breath or hemoptysis.  She denied having any recent weight loss or night sweats.  She has no nausea, vomiting, diarrhea or constipation.  She denied having any skin rash.  The patient had repeat CT scan of the chest performed recently and she is here for evaluation and discussion of her risk her results.  MEDICAL HISTORY: Past Medical History:  Diagnosis Date  . FHx: chemotherapy 2008&2011   4 cycles cisplatin,taxotere,2008& carboplatin and Alimta 2011  . History of migraine  headaches   . Hypercholesterolemia   . lung ca dx'd 06/2006   rt and lt lung  . Lung cancer ([place]) 3/14/1   right- Adenocarcinoma w/bronchioalveolar features  . Radiation 11/01/15-11/14/15   right upper lobe 30 gray  . Radiation pneumonitis ([place]) 01/24/2016    ALLERGIES:  is allergic to simvastatin.  MEDICATIONS:  Current Outpatient Medications  Medication Sig Dispense Refill  . albuterol (PROVENTIL HFA;VENTOLIN HFA) 108 (90 Base) MCG/ACT inhaler Inhale 1-2 puffs into the lungs every 6 (six) hours as needed for wheezing or shortness of breath. 1 Inhaler 2  . Multiple Vitamins-Minerals (CENTRUM SILVER PO) Take 1 tablet by mouth daily. Reported on 12/13/2015    . vitamin C (ASCORBIC ACID) 500 MG tablet Take 500 mg by mouth daily.    . chlorpheniramine-HYDROcodone (TUSSIONEX) 10-8 MG/5ML SUER Take 5 mLs by mouth 2 (two) times daily as needed for cough. (Patient not taking: Reported on 03/04/2018) 140 mL 0  . prochlorperazine (COMPAZINE) 10 MG tablet Take 1 tablet (10 mg total) by mouth every 6 (six) hours as needed. (Patient not taking: Reported on 01/07/2018) 15 tablet 1   No current facility-administered medications for this visit.     SURGICAL HISTORY:  Past Surgical History:  Procedure Laterality Date  . CATARACT EXTRACTION  2013   bilateral  . left  lowerlung lobectomy Left 06/19/2006   Dr.Burney,Left lower lobectomy  . Porta-cath  2011    REVIEW OF SYSTEMS:  Constitutional: positive for fatigue Eyes: negative Ears, nose, mouth, throat, and face: negative Respiratory: positive for cough Cardiovascular: negative Gastrointestinal: negative Genitourinary:negative Integument/breast: negative Hematologic/lymphatic:  negative Musculoskeletal:positive for arthralgias Neurological: negative Behavioral/Psych: negative Endocrine: negative Allergic/Immunologic: negative   PHYSICAL EXAMINATION: General appearance: alert, cooperative, fatigued and no distress Head: Normocephalic, without  obvious abnormality, atraumatic Neck: no adenopathy, no JVD, supple, symmetrical, trachea midline and thyroid not enlarged, symmetric, no tenderness/mass/nodules Lymph nodes: Cervical, supraclavicular, and axillary nodes normal. Resp: wheezes bilaterally Back: symmetric, no curvature. ROM normal. No CVA tenderness. Cardio: regular rate and rhythm, S1, S2 normal, no murmur, click, rub or gallop and normal apical impulse GI: soft, non-tender; bowel sounds normal; no masses,  no organomegaly Extremities: extremities normal, atraumatic, no cyanosis or edema Neurologic: Alert and oriented X 3, normal strength and tone. Normal symmetric reflexes. Normal coordination and gait  ECOG PERFORMANCE STATUS: 1 - Symptomatic but completely ambulatory  Blood pressure 134/66, pulse 84, temperature 98.4 F (36.9 C), temperature source Oral, resp. rate 17, height 5\' 2"  (1.575 m), weight 120 lb 4.8 oz (54.6 kg), SpO2 100 %.  LABORATORY DATA: Lab Results  Component Value Date   WBC 5.9 03/04/2018   HGB 11.9 03/04/2018   HCT 36.7 03/04/2018   MCV 94.8 03/04/2018   PLT 289 03/04/2018      Chemistry      Component Value Date/Time   NA 139 02/04/2018 0941   NA 142 05/28/2017 1015   K 4.2 02/04/2018 0941   K 4.2 05/28/2017 1015   CL 106 02/04/2018 0941   CL 108 (H) 11/04/2012 0947   CO2 26 02/04/2018 0941   CO2 23 05/28/2017 1015   BUN 15 02/04/2018 0941   BUN 14.2 05/28/2017 1015   CREATININE 0.80 02/04/2018 0941   CREATININE 0.9 05/28/2017 1015      Component Value Date/Time   CALCIUM 8.9 02/04/2018 0941   CALCIUM 9.0 05/28/2017 1015   ALKPHOS 105 02/04/2018 0941   ALKPHOS 84 05/28/2017 1015   AST 16 02/04/2018 0941   AST 19 05/28/2017 1015   ALT 7 02/04/2018 0941   ALT 7 05/28/2017 1015   BILITOT 0.5 02/04/2018 0941   BILITOT 0.54 05/28/2017 1015       RADIOGRAPHIC STUDIES: Ct Chest Wo Contrast  Result Date: 02/25/2018 CLINICAL DATA:  Recurrent non-small cell lung cancer EXAM: CT  CHEST WITHOUT CONTRAST TECHNIQUE: Multidetector CT imaging of the chest was performed following the standard protocol without IV contrast. COMPARISON:  12/08/2017 FINDINGS: Cardiovascular: The heart is normal in size. No pericardial effusion. No evidence of thoracic aortic aneurysm. Atherosclerotic calcifications of the aortic arch. Coronary atherosclerosis the LAD and right coronary artery. Right chest port terminates at the cavoatrial junction. Mediastinum/Nodes: 8 mm short axis low right paratracheal node (series 2/image 55), grossly unchanged. No suspicious axillary lymphadenopathy. Visualized thyroid is unremarkable. Lungs/Pleura: 6.1 x 4.0 cm central right upper lobe mass, previously 6.5 x 4.2 cm when remeasured in a similar fashion. Associated air bronchograms are no longer evident within the mass. Surrounding air bronchograms with additional patchy/solid opacity along the posterior aspect of the lesion (series 5/images 42 and 48), possibly reflecting additional tumor versus infection. Right middle lobe atelectasis/collapse. Status post left lower lobectomy. 4.4 x 6.1 cm masslike patchy opacity posteriorly/inferiorly in the left upper lobe (series 5/image 101), previously 5.5 x 5.6 cm. Additional multifocal patchy/nodular opacities in both lobes are waxing/waning when compared to the most recent prior. For example: --17 mm nodule in the medial right lower lobe (series 5/image 64), previously 19 mm, although with increased solid component --6 mm vague posterior right lower lobe nodule (series 5/image 31), previously 9 mm,  improving --7 mm solid posterior right lower lobe nodule (series 5/image 39), previously 6 mm --2.1 cm nodule in the left upper lobe (series 5, image 65), unchanged in size although with progressive solid component --2.8 x 2.3 cm patchy left upper lobe opacity adjacent to the left heart border (series 5/image 85), previously 2.6 x 2.5 cm, with air bronchograms and improving solid component  Overall, the left lower lobe mass has mildly progressed, the right upper lobe mass is mildly improved, and the multifocal metastases are waxing/waning. Trace left pleural effusion. Trace pleural effusion at the right lung apex, unchanged. No pneumothorax. Upper Abdomen: Visualized upper abdomen is notable for prior cholecystectomy and vascular calcifications. Musculoskeletal: No focal osseous lesions. IMPRESSION: Overall tumor burden is grossly unchanged. Left lower lobe masslike opacity may have mildly progressed. Associated trace left pleural effusion, new. Right upper lobe mass may have mildly improved. Surrounding post infectious opacity versus tumor. Trace right pleural effusion at the right lung apex, unchanged. Multifocal patchy/nodular opacities are waxing/waning, although some demonstrate increased solid component. Aortic Atherosclerosis (ICD10-I70.0). Electronically Signed   By: Julian Hy M.D.   On: 02/25/2018 08:45    ASSESSMENT AND PLAN:  This is a very pleasant 84 years old white female with recurrent non-small cell lung cancer, adenocarcinoma status post induction systemic chemotherapy with carboplatin and Alimta with partial response. She is currently on maintenance treatment with single agent Alimta status post 113 cycles. The patient has been tolerating this treatment well with no concerning complaints. Unfortunately restaging scan after cycle 113 showed evidence for disease progression. The patient was started on second line treatment with immunotherapy was Nivolumab 480 mg IV every 4 weeks status post 6 cycles. She has been tolerating her treatment with immunotherapy fairly well except for arthralgia improved with ibuprofen. She had repeat CT scan of the chest performed recently.  I personally and independently reviewed the scans and discussed the results with the patient.  Her scan showed stable disease in general. I recommended for her to proceed with cycle #7 of her treatment  today as scheduled. I will see her back for follow-up visit in 4 weeks for evaluation before the next cycle of her treatment. She was advised to call immediately if she has any concerning symptoms in the interval. The patient voices understanding of current disease status and treatment options and is in agreement with the current care plan. All questions were answered. The patient knows to call the clinic with any problems, questions or concerns. We can certainly see the patient much sooner if necessary.  Disclaimer: This note was dictated with voice recognition software. Similar sounding words can inadvertently be transcribed and may not be corrected upon review.

## 2018-03-04 NOTE — Patient Instructions (Signed)
Cancer Center Discharge Instructions for Patients Receiving Chemotherapy  Today you received the following chemotherapy agents nivolumab   To help prevent nausea and vomiting after your treatment, we encourage you to take your nausea medication as directed   If you develop nausea and vomiting that is not controlled by your nausea medication, call the clinic.   BELOW ARE SYMPTOMS THAT SHOULD BE REPORTED IMMEDIATELY:  *FEVER GREATER THAN 100.5 F  *CHILLS WITH OR WITHOUT FEVER  NAUSEA AND VOMITING THAT IS NOT CONTROLLED WITH YOUR NAUSEA MEDICATION  *UNUSUAL SHORTNESS OF BREATH  *UNUSUAL BRUISING OR BLEEDING  TENDERNESS IN MOUTH AND THROAT WITH OR WITHOUT PRESENCE OF ULCERS  *URINARY PROBLEMS  *BOWEL PROBLEMS  UNUSUAL RASH Items with * indicate a potential emergency and should be followed up as soon as possible.  Feel free to call the clinic you have any questions or concerns. The clinic phone number is [phone].  

## 2018-03-31 ENCOUNTER — Other Ambulatory Visit: Payer: Self-pay | Admitting: Medical Oncology

## 2018-03-31 DIAGNOSIS — C3492 Malignant neoplasm of unspecified part of left bronchus or lung: Principal | ICD-10-CM

## 2018-03-31 DIAGNOSIS — C3491 Malignant neoplasm of unspecified part of right bronchus or lung: Secondary | ICD-10-CM

## 2018-04-01 ENCOUNTER — Other Ambulatory Visit: Payer: Self-pay | Admitting: Medical Oncology

## 2018-04-01 ENCOUNTER — Inpatient Hospital Stay: Payer: Medicare Other

## 2018-04-01 ENCOUNTER — Telehealth: Payer: Self-pay | Admitting: Internal Medicine

## 2018-04-01 ENCOUNTER — Inpatient Hospital Stay: Payer: Medicare Other | Admitting: Internal Medicine

## 2018-04-01 ENCOUNTER — Encounter: Payer: Self-pay | Admitting: Internal Medicine

## 2018-04-01 VITALS — BP 121/71 | HR 86 | Temp 98.5°F | Resp 18 | Ht 62.0 in | Wt 119.5 lb

## 2018-04-01 DIAGNOSIS — R05 Cough: Secondary | ICD-10-CM | POA: Diagnosis not present

## 2018-04-01 DIAGNOSIS — C3492 Malignant neoplasm of unspecified part of left bronchus or lung: Principal | ICD-10-CM

## 2018-04-01 DIAGNOSIS — C3491 Malignant neoplasm of unspecified part of right bronchus or lung: Secondary | ICD-10-CM

## 2018-04-01 DIAGNOSIS — Z5112 Encounter for antineoplastic immunotherapy: Secondary | ICD-10-CM

## 2018-04-01 DIAGNOSIS — Z23 Encounter for immunization: Secondary | ICD-10-CM

## 2018-04-01 DIAGNOSIS — J700 Acute pulmonary manifestations due to radiation: Secondary | ICD-10-CM

## 2018-04-01 DIAGNOSIS — R059 Cough, unspecified: Secondary | ICD-10-CM

## 2018-04-01 DIAGNOSIS — Z95828 Presence of other vascular implants and grafts: Secondary | ICD-10-CM

## 2018-04-01 DIAGNOSIS — C3411 Malignant neoplasm of upper lobe, right bronchus or lung: Secondary | ICD-10-CM

## 2018-04-01 LAB — CBC WITH DIFFERENTIAL (CANCER CENTER ONLY)
Abs Immature Granulocytes: 0.01 10*3/uL (ref 0.00–0.07)
Basophils Absolute: 0 10*3/uL (ref 0.0–0.1)
Basophils Relative: 0 %
EOS PCT: 1 %
Eosinophils Absolute: 0 10*3/uL (ref 0.0–0.5)
HEMATOCRIT: 37.5 % (ref 36.0–46.0)
HEMOGLOBIN: 12.2 g/dL (ref 12.0–15.0)
Immature Granulocytes: 0 %
LYMPHS ABS: 1.3 10*3/uL (ref 0.7–4.0)
LYMPHS PCT: 28 %
MCH: 30.8 pg (ref 26.0–34.0)
MCHC: 32.5 g/dL (ref 30.0–36.0)
MCV: 94.7 fL (ref 80.0–100.0)
MONO ABS: 0.5 10*3/uL (ref 0.1–1.0)
Monocytes Relative: 10 %
Neutro Abs: 2.9 10*3/uL (ref 1.7–7.7)
Neutrophils Relative %: 61 %
Platelet Count: 273 10*3/uL (ref 150–400)
RBC: 3.96 MIL/uL (ref 3.87–5.11)
RDW: 14.3 % (ref 11.5–15.5)
WBC Count: 4.7 10*3/uL (ref 4.0–10.5)
nRBC: 0 % (ref 0.0–0.2)

## 2018-04-01 LAB — CMP (CANCER CENTER ONLY)
ALK PHOS: 107 U/L (ref 38–126)
ALT: 8 U/L (ref 0–44)
ANION GAP: 6 (ref 5–15)
AST: 17 U/L (ref 15–41)
Albumin: 3 g/dL — ABNORMAL LOW (ref 3.5–5.0)
BUN: 16 mg/dL (ref 8–23)
CALCIUM: 8.9 mg/dL (ref 8.9–10.3)
CO2: 26 mmol/L (ref 22–32)
Chloride: 107 mmol/L (ref 98–111)
Creatinine: 0.76 mg/dL (ref 0.44–1.00)
GFR, Estimated: >60 mL/min (ref >60)
GLUCOSE: 107 mg/dL — AB (ref 70–99)
Potassium: 4.1 mmol/L (ref 3.5–5.1)
Sodium: 139 mmol/L (ref 135–145)
Total Bilirubin: 0.5 mg/dL (ref 0.3–1.2)
Total Protein: 7.3 g/dL (ref 6.5–8.1)

## 2018-04-01 MED ORDER — SODIUM CHLORIDE 0.9 % IV SOLN
Freq: Once | INTRAVENOUS | Status: AC
  Administered 2018-04-01: 11:00:00 via INTRAVENOUS
  Filled 2018-04-01: qty 250

## 2018-04-01 MED ORDER — SODIUM CHLORIDE 0.9% FLUSH
10.0000 mL | INTRAVENOUS | Status: DC | PRN
  Administered 2018-04-01: 10 mL
  Filled 2018-04-01: qty 10

## 2018-04-01 MED ORDER — INFLUENZA VAC SPLIT QUAD 0.5 ML IM SUSY
PREFILLED_SYRINGE | INTRAMUSCULAR | Status: AC
  Filled 2018-04-01: qty 0.5

## 2018-04-01 MED ORDER — HYDROCOD POLST-CPM POLST ER 10-8 MG/5ML PO SUER: 5.0000 mL | Freq: Two times a day (BID) | ORAL | 0 refills | Status: DC | PRN

## 2018-04-01 MED ORDER — HEPARIN SOD (PORK) LOCK FLUSH 100 UNIT/ML IV SOLN
500.0000 [IU] | Freq: Once | INTRAVENOUS | Status: AC | PRN
  Administered 2018-04-01: 500 [IU]
  Filled 2018-04-01: qty 5

## 2018-04-01 MED ORDER — INFLUENZA VAC SPLIT QUAD 0.5 ML IM SUSY
0.5000 mL | PREFILLED_SYRINGE | Freq: Once | INTRAMUSCULAR | Status: AC
  Administered 2018-04-01: 0.5 mL via INTRAMUSCULAR

## 2018-04-01 MED ORDER — SODIUM CHLORIDE 0.9 % IV SOLN
480.0000 mg | Freq: Once | INTRAVENOUS | Status: AC
  Administered 2018-04-01: 480 mg via INTRAVENOUS
  Filled 2018-04-01: qty 48

## 2018-04-01 NOTE — Progress Notes (Signed)
[place] [phone]   Fax:(336) [phone]  OFFICE PROGRESS NOTE  [name], MD [place] [address]  DIAGNOSIS: Recurrent non-small cell lung cancer, adenocarcinoma initially diagnosed as stage IB (T2, N0, M0) in June 2008 with tumor size of 8.0 cm.  PRIOR THERAPY: #1 status post left upper lobectomy with lymph node dissection under the care of Dr. Arlyce Dice on 06/29/2006.  #2 status post 4 cycles of adjuvant chemotherapy with cisplatin and Taxotere last dose given 08/01/2006.  #3 status post 6 cycles of systemic chemotherapy with carboplatin and Alimta for disease recurrence last dose given 12/12/2009.  #4 palliative radiotherapy to the enlarging right upper lobe lung mass under the care of Dr. Pablo Ledger completed on 11/14/2015. #5 Maintenance systemic chemotherapy with single agent Alimta 400 MG/M2 every 3 weeks, status post 113 cycles.  CURRENT THERAPY: Second line treatment with immunotherapy with Nivolumab 480 mg IV every 4 weeks.  First dose 09/17/2017.  Status post 7 cycles.  INTERVAL HISTORY: [name] 84 y.o. female returns to the clinic today for follow-up visit.  The patient is feeling fine today with no concerning complaints except for the dry cough and shortness of breath with exertion.  She is requesting refill of Tussionex.  She denied having any chest pain or hemoptysis.  She denied having any fever or chills.  She has no nausea, vomiting, diarrhea or constipation.  She denied having any weight loss or night sweats.  She is here today for evaluation before starting cycle #8 of her treatment.  MEDICAL HISTORY: Past Medical History:  Diagnosis Date  . FHx: chemotherapy 2008&2011   4 cycles cisplatin,taxotere,2008& carboplatin and Alimta 2011  . History of migraine headaches   . Hypercholesterolemia   . lung ca dx'd 06/2006   rt and lt lung  . Lung cancer ([place]) 3/14/1   right- Adenocarcinoma  w/bronchioalveolar features  . Radiation 11/01/15-11/14/15   right upper lobe 30 gray  . Radiation pneumonitis ([place]) 01/24/2016    ALLERGIES:  is allergic to simvastatin.  MEDICATIONS:  Current Outpatient Medications  Medication Sig Dispense Refill  . albuterol (PROVENTIL HFA;VENTOLIN HFA) 108 (90 Base) MCG/ACT inhaler Inhale 1-2 puffs into the lungs every 6 (six) hours as needed for wheezing or shortness of breath. 1 Inhaler 2  . Multiple Vitamins-Minerals (CENTRUM SILVER PO) Take 1 tablet by mouth daily. Reported on 12/13/2015    . vitamin C (ASCORBIC ACID) 500 MG tablet Take 500 mg by mouth daily.    . chlorpheniramine-HYDROcodone (TUSSIONEX) 10-8 MG/5ML SUER Take 5 mLs by mouth 2 (two) times daily as needed for cough. 140 mL 0  . prochlorperazine (COMPAZINE) 10 MG tablet Take 1 tablet (10 mg total) by mouth every 6 (six) hours as needed. (Patient not taking: Reported on 01/07/2018) 15 tablet 1   No current facility-administered medications for this visit.     SURGICAL HISTORY:  Past Surgical History:  Procedure Laterality Date  . CATARACT EXTRACTION  2013   bilateral  . left  lowerlung lobectomy Left 06/19/2006   Dr.Burney,Left lower lobectomy  . Porta-cath  2011    REVIEW OF SYSTEMS:  A comprehensive review of systems was negative except for: Constitutional: positive for fatigue Respiratory: positive for cough and dyspnea on exertion   PHYSICAL EXAMINATION: General appearance: alert, cooperative, fatigued and no distress Head: Normocephalic, without obvious abnormality, atraumatic Neck: no adenopathy, no JVD, supple, symmetrical, trachea midline and thyroid not enlarged, symmetric, no tenderness/mass/nodules Lymph  nodes: Cervical, supraclavicular, and axillary nodes normal. Resp: rales bilaterally Back: symmetric, no curvature. ROM normal. No CVA tenderness. Cardio: regular rate and rhythm, S1, S2 normal, no murmur, click, rub or gallop and normal apical impulse GI: soft,  non-tender; bowel sounds normal; no masses,  no organomegaly Extremities: extremities normal, atraumatic, no cyanosis or edema  ECOG PERFORMANCE STATUS: 1 - Symptomatic but completely ambulatory  Blood pressure 121/71, pulse 86, temperature 98.5 F (36.9 C), temperature source Oral, resp. rate 18, height 5\' 2"  (1.575 m), weight 119 lb 8 oz (54.2 kg), SpO2 100 %.  LABORATORY DATA: Lab Results  Component Value Date   WBC 4.7 04/01/2018   HGB 12.2 04/01/2018   HCT 37.5 04/01/2018   MCV 94.7 04/01/2018   PLT 273 04/01/2018      Chemistry      Component Value Date/Time   NA 139 04/01/2018 0911   NA 142 05/28/2017 1015   K 4.1 04/01/2018 0911   K 4.2 05/28/2017 1015   CL 107 04/01/2018 0911   CL 108 (H) 11/04/2012 0947   CO2 26 04/01/2018 0911   CO2 23 05/28/2017 1015   BUN 16 04/01/2018 0911   BUN 14.2 05/28/2017 1015   CREATININE 0.76 04/01/2018 0911   CREATININE 0.9 05/28/2017 1015      Component Value Date/Time   CALCIUM 8.9 04/01/2018 0911   CALCIUM 9.0 05/28/2017 1015   ALKPHOS 107 04/01/2018 0911   ALKPHOS 84 05/28/2017 1015   AST 17 04/01/2018 0911   AST 19 05/28/2017 1015   ALT 8 04/01/2018 0911   ALT 7 05/28/2017 1015   BILITOT 0.5 04/01/2018 0911   BILITOT 0.54 05/28/2017 1015       RADIOGRAPHIC STUDIES: No results found.  ASSESSMENT AND PLAN:  This is a very pleasant 84 years old white female with recurrent non-small cell lung cancer, adenocarcinoma status post induction systemic chemotherapy with carboplatin and Alimta with partial response. She is currently on maintenance treatment with single agent Alimta status post 113 cycles. The patient has been tolerating this treatment well with no concerning complaints. Unfortunately restaging scan after cycle 113 showed evidence for disease progression. The patient was started on second line treatment with immunotherapy with Nivolumab 480 mg IV every 4 weeks status post 7 cycles. She continues to tolerate her  treatment well with no concerning adverse effect except for the persistent dry cough from the previous radiotherapy. I recommended for the patient to proceed with cycle #8 today as scheduled. I will see her back for follow-up visit in 4 weeks for evaluation before the next cycle of her treatment. For the dry cough I gave her a refill of Tussionex. The patient was advised to call immediately if she has any concerning symptoms in the interval. The patient voices understanding of current disease status and treatment options and is in agreement with the current care plan. All questions were answered. The patient knows to call the clinic with any problems, questions or concerns. We can certainly see the patient much sooner if necessary.  Disclaimer: This note was dictated with voice recognition software. Similar sounding words can inadvertently be transcribed and may not be corrected upon review.

## 2018-04-01 NOTE — Patient Instructions (Addendum)
[facility] Discharge Instructions for Patients Receiving Chemotherapy  Today you received the following chemotherapy agents Nivolumab  To help prevent nausea and vomiting after your treatment, we encourage you to take your nausea medication as directed   If you develop nausea and vomiting that is not controlled by your nausea medication, call the clinic.   BELOW ARE SYMPTOMS THAT SHOULD BE REPORTED IMMEDIATELY:  *FEVER GREATER THAN 100.5 F  *CHILLS WITH OR WITHOUT FEVER  NAUSEA AND VOMITING THAT IS NOT CONTROLLED WITH YOUR NAUSEA MEDICATION  *UNUSUAL SHORTNESS OF BREATH  *UNUSUAL BRUISING OR BLEEDING  TENDERNESS IN MOUTH AND THROAT WITH OR WITHOUT PRESENCE OF ULCERS  *URINARY PROBLEMS  *BOWEL PROBLEMS  UNUSUAL RASH Items with * indicate a potential emergency and should be followed up as soon as possible.  Feel free to call the clinic should you have any questions or concerns. The clinic phone number is [phone].  Please show the [facility] at check-in to the Emergency Department and triage nurse.   Preventing Influenza, Adult Influenza, more commonly known as "the flu," is a viral infection that mainly affects the respiratory tract. The respiratory tract includes structures that help you breathe, such as the lungs, nose, and throat. The flu causes many common cold symptoms, as well as a high fever and body aches. The flu spreads easily from person to person (is contagious). The flu is most common from December through March. This is called flu season.You can catch the flu virus by:  Breathing in droplets from an infected person's cough or sneeze.  Touching something that was recently contaminated with the virus and then touching your mouth, nose, or eyes.  What can I do to lower my risk? You can decrease your risk of getting the flu by:  Getting a flu shot (influenza vaccination) every year. This is the best way to prevent the flu. A flu shot  is recommended for everyone age 28 months and older. ? It is best to get a flu shot in the fall, as soon as it is available. Getting a flu shot during winter or spring instead is still a good idea. Flu season can last into early spring. ? Preventing the flu through vaccination requires getting a new flu shot every year. This is because the flu virus changes slightly (mutates) from one year to the next. Even if a flu shot does not completely protect you from all flu virus mutations, it can reduce the severity of your illness and prevent dangerous complications of the flu. ? If you are pregnant, you can and should get a flu shot. ? If you have had a reaction to the shot in the past or if you are allergic to eggs, check with your health care provider before getting a flu shot. ? Sometimes the vaccine is available as a nasal spray. In some years, the nasal spray has not been as effective against the flu virus. Check with your health care provider if you have questions about this.  Practicing good health habits. This is especially important during flu season. ? Avoid contact with people who are sick with flu or cold symptoms. ? Wash your hands with soap and water often. If soap and water are not available, use hand sanitizer. ? Avoid touching your hands to your face, especially when you have not washed your hands recently. ? Use a disinfectant to clean surfaces at home and at work that may be contaminated with the flu virus. ?  Keep your body's disease-fighting system (immune system) in good shape by eating a healthy diet, drinking plenty of fluids, getting enough sleep, and exercising regularly.  If you do get the flu, avoid spreading it to others by:  Staying home until your symptoms have been gone for at least one day.  Covering your mouth and nose with your elbow when you cough or sneeze.  Avoiding close contact with others, especially babies and elderly people.  Why are these changes  important? Getting a flu shot and practicing good health habits protects you as well as other people. If you get the flu, your friends, family, and co-workers are also at risk of getting it, because it spreads so easily to others. Each year, about 2 out of every 10 people get the flu. Having the flu can lead to complications, such as pneumonia, ear infection, and sinus infection. The flu also can be deadly, especially for babies, people older than age 85, and people who have serious long-term diseases. How is this treated? Most people recover from the flu by resting at home and drinking plenty of fluids. However, a prescription antiviral medicine may reduce your flu symptoms and may make your flu go away sooner. This medicine must be started within a few days of getting flu symptoms. You can talk with your health care provider about whether you need an antiviral medicine. Antiviral medicine may be prescribed for people who are at risk for more serious flu symptoms. This includes people who:  Are older than age 82.  Are pregnant.  Have a condition that makes the flu worse or more dangerous.  Where to find more information:  Centers for Disease Control and Prevention: http://www.smith-bell.org/  [facility].[employer].ee: azureicus.com  American Academy of Family Physicians: familydoctor.org/familydoctor/en/kids/vaccines/preventing-the-flu.html Contact a health care provider if:  You have influenza and you develop new symptoms.  You have: ? Chest pain. ? Diarrhea. ? A fever.  Your cough gets worse, or you produce more mucus. Summary  The best way to prevent the flu is to get a flu shot every year in the fall.  Even if you get the flu after you have received the yearly vaccine, your flu may be milder and go away sooner because of your flu shot.  If you get the flu, antiviral medicines that are started with a few days of symptoms may reduce your flu symptoms and may make your flu go  away sooner.  You can also help prevent the flu by practicing good health habits. This information is not intended to replace advice given to you by your health care provider. Make sure you discuss any questions you have with your health care provider. Document Released: 06/04/2015 Document Revised: 01/27/2016 Document Reviewed: 01/27/2016 Elsevier Interactive Patient Education  [employer].

## 2018-04-01 NOTE — Telephone Encounter (Signed)
Appts scheduled avs/calendar per 10/30 los

## 2018-04-06 ENCOUNTER — Telehealth: Payer: Self-pay | Admitting: Internal Medicine

## 2018-04-06 NOTE — Telephone Encounter (Signed)
[place] afternoonn PAL 11/27 - moved appointments to 11/26. Not able to reach patient by phone or leave message. Updated schedule mailed.

## 2018-04-24 ENCOUNTER — Other Ambulatory Visit: Payer: Self-pay | Admitting: Pharmacist

## 2018-04-24 DIAGNOSIS — C3491 Malignant neoplasm of unspecified part of right bronchus or lung: Secondary | ICD-10-CM

## 2018-04-24 DIAGNOSIS — C3492 Malignant neoplasm of unspecified part of left bronchus or lung: Principal | ICD-10-CM

## 2018-04-27 ENCOUNTER — Other Ambulatory Visit: Payer: Self-pay | Admitting: Medical Oncology

## 2018-04-27 DIAGNOSIS — C3492 Malignant neoplasm of unspecified part of left bronchus or lung: Principal | ICD-10-CM

## 2018-04-27 DIAGNOSIS — C3491 Malignant neoplasm of unspecified part of right bronchus or lung: Secondary | ICD-10-CM

## 2018-04-28 ENCOUNTER — Inpatient Hospital Stay: Payer: Medicare Other | Attending: Internal Medicine

## 2018-04-28 ENCOUNTER — Encounter: Payer: Self-pay | Admitting: Oncology

## 2018-04-28 ENCOUNTER — Inpatient Hospital Stay (HOSPITAL_BASED_OUTPATIENT_CLINIC_OR_DEPARTMENT_OTHER): Payer: Medicare Other | Admitting: Oncology

## 2018-04-28 ENCOUNTER — Inpatient Hospital Stay: Payer: Medicare Other

## 2018-04-28 VITALS — BP 133/75 | HR 77 | Temp 98.1°F | Resp 18 | Ht 62.0 in | Wt 119.7 lb

## 2018-04-28 DIAGNOSIS — C3411 Malignant neoplasm of upper lobe, right bronchus or lung: Secondary | ICD-10-CM

## 2018-04-28 DIAGNOSIS — C3492 Malignant neoplasm of unspecified part of left bronchus or lung: Principal | ICD-10-CM

## 2018-04-28 DIAGNOSIS — Z5112 Encounter for antineoplastic immunotherapy: Secondary | ICD-10-CM | POA: Insufficient documentation

## 2018-04-28 DIAGNOSIS — Z95828 Presence of other vascular implants and grafts: Secondary | ICD-10-CM

## 2018-04-28 DIAGNOSIS — Z79899 Other long term (current) drug therapy: Secondary | ICD-10-CM | POA: Diagnosis not present

## 2018-04-28 DIAGNOSIS — C3491 Malignant neoplasm of unspecified part of right bronchus or lung: Secondary | ICD-10-CM

## 2018-04-28 LAB — CBC WITH DIFFERENTIAL (CANCER CENTER ONLY)
Abs Immature Granulocytes: 0.01 10*3/uL (ref 0.00–0.07)
Basophils Absolute: 0 10*3/uL (ref 0.0–0.1)
Basophils Relative: 1 %
EOS ABS: 0.1 10*3/uL (ref 0.0–0.5)
EOS PCT: 2 %
HCT: 37.2 % (ref 36.0–46.0)
Hemoglobin: 12 g/dL (ref 12.0–15.0)
Immature Granulocytes: 0 %
Lymphocytes Relative: 36 %
Lymphs Abs: 1.9 10*3/uL (ref 0.7–4.0)
MCH: 30.5 pg (ref 26.0–34.0)
MCHC: 32.3 g/dL (ref 30.0–36.0)
MCV: 94.7 fL (ref 80.0–100.0)
MONO ABS: 0.5 10*3/uL (ref 0.1–1.0)
Monocytes Relative: 10 %
Neutro Abs: 2.7 10*3/uL (ref 1.7–7.7)
Neutrophils Relative %: 51 %
Platelet Count: 287 10*3/uL (ref 150–400)
RBC: 3.93 MIL/uL (ref 3.87–5.11)
RDW: 14.4 % (ref 11.5–15.5)
WBC: 5.2 10*3/uL (ref 4.0–10.5)
nRBC: 0 % (ref 0.0–0.2)

## 2018-04-28 LAB — CMP (CANCER CENTER ONLY)
ALT: 7 U/L (ref 0–44)
ANION GAP: 6 (ref 5–15)
AST: 17 U/L (ref 15–41)
Albumin: 3.1 g/dL — ABNORMAL LOW (ref 3.5–5.0)
Alkaline Phosphatase: 118 U/L (ref 38–126)
BUN: 21 mg/dL (ref 8–23)
CALCIUM: 8.8 mg/dL — AB (ref 8.9–10.3)
CHLORIDE: 108 mmol/L (ref 98–111)
CO2: 25 mmol/L (ref 22–32)
Creatinine: 0.73 mg/dL (ref 0.44–1.00)
GFR, Estimated: >60 mL/min (ref >60)
Glucose, Bld: 99 mg/dL (ref 70–99)
Potassium: 4 mmol/L (ref 3.5–5.1)
SODIUM: 139 mmol/L (ref 135–145)
Total Bilirubin: 0.4 mg/dL (ref 0.3–1.2)
Total Protein: 7.7 g/dL (ref 6.5–8.1)

## 2018-04-28 LAB — TSH: TSH: 4.778 u[IU]/mL — AB (ref 0.308–3.960)

## 2018-04-28 MED ORDER — HEPARIN SOD (PORK) LOCK FLUSH 100 UNIT/ML IV SOLN
500.0000 [IU] | Freq: Once | INTRAVENOUS | Status: AC | PRN
  Administered 2018-04-28: 500 [IU]
  Filled 2018-04-28: qty 5

## 2018-04-28 MED ORDER — SODIUM CHLORIDE 0.9% FLUSH
10.0000 mL | INTRAVENOUS | Status: DC | PRN
  Administered 2018-04-28: 10 mL
  Filled 2018-04-28: qty 10

## 2018-04-28 MED ORDER — SODIUM CHLORIDE 0.9 % IV SOLN
Freq: Once | INTRAVENOUS | Status: AC
  Administered 2018-04-28: 14:00:00 via INTRAVENOUS
  Filled 2018-04-28: qty 250

## 2018-04-28 MED ORDER — SODIUM CHLORIDE 0.9 % IV SOLN
480.0000 mg | Freq: Once | INTRAVENOUS | Status: AC
  Administered 2018-04-28: 480 mg via INTRAVENOUS
  Filled 2018-04-28: qty 48

## 2018-04-28 NOTE — Assessment & Plan Note (Addendum)
This is a very pleasant 84 year old white female with recurrent non-small cell lung cancer, adenocarcinoma status post induction systemic chemotherapy with carboplatin and Alimta with partial response. She is currently on maintenance treatment with single agent Alimta status post 113 cycles. The patient has been tolerating this treatment well with no concerning complaints. Unfortunately restaging scan after cycle 113 showed evidence for disease progression. The patient was started on second line treatment with immunotherapy with Nivolumab 480 mg IV every 4 weeks status post 8 cycles. She continues to tolerate her treatment well with no concerning adverse effect except for the persistent dry cough from the previous radiotherapy. I recommended for the patient to proceed with cycle #9 today as scheduled.  She will have a restaging CT scan of the chest prior to her next visit.  She will follow-up in 4 weeks for evaluation prior to cycle #10 and to review her restaging CT scan of the chest. For the dry cough, she will continue Tussionex.  The patient was advised to call immediately if she has any concerning symptoms in the interval. The patient voices understanding of current disease status and treatment options and is in agreement with the current care plan. All questions were answered. The patient knows to call the clinic with any problems, questions or concerns. We can certainly see the patient much sooner if necessary.

## 2018-04-28 NOTE — Patient Instructions (Signed)
[facility] Discharge Instructions for Patients Receiving Chemotherapy  Today you received the following chemotherapy agents Opdivo  To help prevent nausea and vomiting after your treatment, we encourage you to take your nausea medication as directed   If you develop nausea and vomiting that is not controlled by your nausea medication, call the clinic.   BELOW ARE SYMPTOMS THAT SHOULD BE REPORTED IMMEDIATELY:  *FEVER GREATER THAN 100.5 F  *CHILLS WITH OR WITHOUT FEVER  NAUSEA AND VOMITING THAT IS NOT CONTROLLED WITH YOUR NAUSEA MEDICATION  *UNUSUAL SHORTNESS OF BREATH  *UNUSUAL BRUISING OR BLEEDING  TENDERNESS IN MOUTH AND THROAT WITH OR WITHOUT PRESENCE OF ULCERS  *URINARY PROBLEMS  *BOWEL PROBLEMS  UNUSUAL RASH Items with * indicate a potential emergency and should be followed up as soon as possible.  Feel free to call the clinic should you have any questions or concerns. The clinic phone number is [phone].  Please show the CHEMO ALERT CARD at check-in to the Emergency Department and triage nurse.   

## 2018-04-28 NOTE — Progress Notes (Signed)
[facility] OFFICE PROGRESS NOTE  [name], [place] [address]  DIAGNOSIS: Recurrent non-small cell lung cancer, adenocarcinoma initially diagnosed as stage IB (T2, N0, M0) in June 2008 with tumor size of 8.0 cm.  PRIOR THERAPY: #1 status post left upper lobectomy with lymph node dissection under the care of Dr. Arlyce Dice on 06/29/2006.  #2 status post 4 cycles of adjuvant chemotherapy with cisplatin and Taxotere last dose given 08/01/2006.  #3 status post 6 cycles of systemic chemotherapy with carboplatin and Alimta for disease recurrence last dose given 12/12/2009. #4 palliative radiotherapy to the enlarging right upper lobe lung mass under the care of Dr. Pablo Ledger completed on 11/14/2015. #5 Maintenance systemic chemotherapy with single agent Alimta 400 MG/M2 every 3 weeks, status post 113 cycles.  CURRENT THERAPY: Second line treatment with immunotherapy with Nivolumab 480 mg IV every 4 weeks.  First dose 09/17/2017.  Status post 8 cycles.  INTERVAL HISTORY: [name] 84 y.o. female returns for routine follow-up visit by herself.  The patient is feeling fine today and has no specific complaints the for ongoing nonproductive cough.  Her cough has not changed.  She uses Tussionex on as-needed basis.  Denies fevers and chills.  Denies chest pain, shortness of breath, hemoptysis.  Denies nausea, vomiting, constipation, diarrhea.  Denies recent weight loss or night sweats.  The patient is here for evaluation prior to cycle #9 of nivolumab.  MEDICAL HISTORY: Past Medical History:  Diagnosis Date  . FHx: chemotherapy 2008&2011   4 cycles cisplatin,taxotere,2008& carboplatin and Alimta 2011  . History of migraine headaches   . Hypercholesterolemia   . lung ca dx'd 06/2006   rt and lt lung  . Lung cancer ([facility]) 3/14/1   right- Adenocarcinoma w/bronchioalveolar features  . Radiation 11/01/15-11/14/15   right upper lobe 30 gray  . Radiation  pneumonitis ([facility]) 01/24/2016    ALLERGIES:  is allergic to simvastatin.  MEDICATIONS:  Current Outpatient Medications  Medication Sig Dispense Refill  . chlorpheniramine-HYDROcodone (TUSSIONEX) 10-8 MG/5ML SUER Take 5 mLs by mouth 2 (two) times daily as needed for cough. 140 mL 0  . Multiple Vitamins-Minerals (CENTRUM SILVER PO) Take 1 tablet by mouth daily. Reported on 12/13/2015    . vitamin C (ASCORBIC ACID) 500 MG tablet Take 500 mg by mouth daily.    Marland Kitchen albuterol (PROVENTIL HFA;VENTOLIN HFA) 108 (90 Base) MCG/ACT inhaler Inhale 1-2 puffs into the lungs every 6 (six) hours as needed for wheezing or shortness of breath. (Patient not taking: Reported on 04/28/2018) 1 Inhaler 2  . prochlorperazine (COMPAZINE) 10 MG tablet Take 1 tablet (10 mg total) by mouth every 6 (six) hours as needed. (Patient not taking: Reported on 01/07/2018) 15 tablet 1   No current facility-administered medications for this visit.     SURGICAL HISTORY:  Past Surgical History:  Procedure Laterality Date  . CATARACT EXTRACTION  2013   bilateral  . left  lowerlung lobectomy Left 06/19/2006   Dr.Burney,Left lower lobectomy  . Porta-cath  2011    REVIEW OF SYSTEMS:   Review of Systems  Constitutional: Negative for appetite change, chills, fatigue, fever and unexpected weight change.  HENT:   Negative for mouth sores, nosebleeds, sore throat and trouble swallowing.   Eyes: Negative for eye problems and icterus.  Respiratory: Negative for hemoptysis, shortness of breath and wheezing.  Positive for cough. Cardiovascular: Negative for chest pain and leg swelling.  Gastrointestinal: Negative for abdominal pain, constipation, diarrhea, nausea and  vomiting.  Genitourinary: Negative for bladder incontinence, difficulty urinating, dysuria, frequency and hematuria.   Musculoskeletal: Negative for back pain, gait problem, neck pain and neck stiffness.  Skin: Negative for itching and rash.  Neurological: Negative for  dizziness, extremity weakness, gait problem, headaches, light-headedness and seizures.  Hematological: Negative for adenopathy. Does not bruise/bleed easily.  Psychiatric/Behavioral: Negative for confusion, depression and sleep disturbance. The patient is not nervous/anxious.     PHYSICAL EXAMINATION:  Blood pressure 133/75, pulse 77, temperature 98.1 F (36.7 C), temperature source Oral, resp. rate 18, height 5\' 2"  (1.575 m), weight 119 lb 11.2 oz (54.3 kg), SpO2 100 %.  ECOG PERFORMANCE STATUS: 1 - Symptomatic but completely ambulatory  Physical Exam  Constitutional: Oriented to person, place, and time and well-developed, well-nourished, and in no distress. No distress.  HENT:  Head: Normocephalic and atraumatic.  Mouth/Throat: Oropharynx is clear and moist. No oropharyngeal exudate.  Eyes: Conjunctivae are normal. Right eye exhibits no discharge. Left eye exhibits no discharge. No scleral icterus.  Neck: Normal range of motion. Neck supple.  Cardiovascular: Normal rate, regular rhythm, normal heart sounds and intact distal pulses.   Pulmonary/Chest: Effort normal and breath sounds normal. No respiratory distress. No wheezes. No rales.  Abdominal: Soft. Bowel sounds are normal. Exhibits no distension and no mass. There is no tenderness.  Musculoskeletal: Normal range of motion. Exhibits no edema.  Lymphadenopathy:    No cervical adenopathy.  Neurological: Alert and oriented to person, place, and time. Exhibits normal muscle tone. Gait normal. Coordination normal.  Skin: Skin is warm and dry. No rash noted. Not diaphoretic. No erythema. No pallor.  Psychiatric: Mood, memory and judgment normal.  Vitals reviewed.  LABORATORY DATA: Lab Results  Component Value Date   WBC 5.2 04/28/2018   HGB 12.0 04/28/2018   HCT 37.2 04/28/2018   MCV 94.7 04/28/2018   PLT 287 04/28/2018      Chemistry      Component Value Date/Time   NA 139 04/01/2018 0911   NA 142 05/28/2017 1015   K 4.1  04/01/2018 0911   K 4.2 05/28/2017 1015   CL 107 04/01/2018 0911   CL 108 (H) 11/04/2012 0947   CO2 26 04/01/2018 0911   CO2 23 05/28/2017 1015   BUN 16 04/01/2018 0911   BUN 14.2 05/28/2017 1015   CREATININE 0.76 04/01/2018 0911   CREATININE 0.9 05/28/2017 1015      Component Value Date/Time   CALCIUM 8.9 04/01/2018 0911   CALCIUM 9.0 05/28/2017 1015   ALKPHOS 107 04/01/2018 0911   ALKPHOS 84 05/28/2017 1015   AST 17 04/01/2018 0911   AST 19 05/28/2017 1015   ALT 8 04/01/2018 0911   ALT 7 05/28/2017 1015   BILITOT 0.5 04/01/2018 0911   BILITOT 0.54 05/28/2017 1015       RADIOGRAPHIC STUDIES:  No results found.   ASSESSMENT/PLAN:  Bilateral lung cancer [facility]) This is a very pleasant 84 year old white female with recurrent non-small cell lung cancer, adenocarcinoma status post induction systemic chemotherapy with carboplatin and Alimta with partial response. She is currently on maintenance treatment with single agent Alimta status post 113 cycles. The patient has been tolerating this treatment well with no concerning complaints. Unfortunately restaging scan after cycle 113 showed evidence for disease progression. The patient was started on second line treatment with immunotherapy with Nivolumab 480 mg IV every 4 weeks status post 8 cycles. She continues to tolerate her treatment well with no concerning adverse effect except  for the persistent dry cough from the previous radiotherapy. I recommended for the patient to proceed with cycle #9 today as scheduled.  She will have a restaging CT scan of the chest prior to her next visit.  She will follow-up in 4 weeks for evaluation prior to cycle #10 and to review her restaging CT scan of the chest. For the dry cough, she will continue Tussionex.  The patient was advised to call immediately if she has any concerning symptoms in the interval. The patient voices understanding of current disease status and treatment options and is in  agreement with the current care plan. All questions were answered. The patient knows to call the clinic with any problems, questions or concerns. We can certainly see the patient much sooner if necessary.   Orders Placed This Encounter  Procedures  . CT CHEST WO CONTRAST    Standing Status:   Future    Standing Expiration Date:   04/29/2019    Order Specific Question:   Preferred imaging location?    Answer:   [facility]    Order Specific Question:   Radiology Contrast Protocol - do NOT remove file path    Answer:   [email or web address]    Order Specific Question:   ** REASON FOR EXAM (FREE TEXT)    Answer:   Lung cancer. Restaging.  Marland Kitchen CBC with Differential (Cancer Center Only)    Standing Status:   Standing    Number of Occurrences:   12    Standing Expiration Date:   04/29/2019  . CMP ([facility] only)    Standing Status:   Standing    Number of Occurrences:   12    Standing Expiration Date:   04/29/2019     Mikey Bussing, DNP, AGPCNP-BC, AOCNP 04/28/18

## 2018-04-29 ENCOUNTER — Other Ambulatory Visit: Payer: Medicare Other

## 2018-04-29 ENCOUNTER — Ambulatory Visit: Payer: Medicare Other

## 2018-04-29 ENCOUNTER — Telehealth: Payer: Self-pay | Admitting: Oncology

## 2018-04-29 ENCOUNTER — Ambulatory Visit: Payer: Medicare Other | Admitting: Oncology

## 2018-04-29 NOTE — Telephone Encounter (Signed)
Scheduled appt per 11/26 los -pt to get an updated schedule next visit.

## 2018-05-22 ENCOUNTER — Ambulatory Visit (HOSPITAL_COMMUNITY)
Admission: RE | Admit: 2018-05-22 | Discharge: 2018-05-22 | Disposition: A | Discharge status: Home / Self Care | Payer: Medicare Other | Source: Ambulatory Visit | Attending: Oncology | Admitting: Oncology

## 2018-05-22 DIAGNOSIS — C3491 Malignant neoplasm of unspecified part of right bronchus or lung: Secondary | ICD-10-CM | POA: Insufficient documentation

## 2018-05-22 DIAGNOSIS — C3492 Malignant neoplasm of unspecified part of left bronchus or lung: Secondary | ICD-10-CM | POA: Diagnosis present

## 2018-05-25 ENCOUNTER — Encounter: Payer: Self-pay | Admitting: Internal Medicine

## 2018-05-25 ENCOUNTER — Inpatient Hospital Stay: Payer: Medicare Other

## 2018-05-25 ENCOUNTER — Telehealth: Payer: Self-pay | Admitting: Internal Medicine

## 2018-05-25 ENCOUNTER — Inpatient Hospital Stay: Payer: Medicare Other | Attending: Internal Medicine

## 2018-05-25 ENCOUNTER — Inpatient Hospital Stay: Payer: Medicare Other | Admitting: Internal Medicine

## 2018-05-25 VITALS — BP 127/65 | HR 86 | Temp 97.7°F | Resp 18 | Ht 62.0 in | Wt 119.8 lb

## 2018-05-25 DIAGNOSIS — Z5112 Encounter for antineoplastic immunotherapy: Secondary | ICD-10-CM

## 2018-05-25 DIAGNOSIS — C3411 Malignant neoplasm of upper lobe, right bronchus or lung: Secondary | ICD-10-CM

## 2018-05-25 DIAGNOSIS — C3491 Malignant neoplasm of unspecified part of right bronchus or lung: Secondary | ICD-10-CM

## 2018-05-25 DIAGNOSIS — C3492 Malignant neoplasm of unspecified part of left bronchus or lung: Principal | ICD-10-CM

## 2018-05-25 DIAGNOSIS — J700 Acute pulmonary manifestations due to radiation: Secondary | ICD-10-CM

## 2018-05-25 DIAGNOSIS — E039 Hypothyroidism, unspecified: Secondary | ICD-10-CM | POA: Insufficient documentation

## 2018-05-25 DIAGNOSIS — Z95828 Presence of other vascular implants and grafts: Secondary | ICD-10-CM

## 2018-05-25 LAB — CBC WITH DIFFERENTIAL (CANCER CENTER ONLY)
Abs Immature Granulocytes: 0.01 10*3/uL (ref 0.00–0.07)
Basophils Absolute: 0 10*3/uL (ref 0.0–0.1)
Basophils Relative: 1 %
Eosinophils Absolute: 0.1 10*3/uL (ref 0.0–0.5)
Eosinophils Relative: 1 %
HCT: 37.6 % (ref 36.0–46.0)
Hemoglobin: 12 g/dL (ref 12.0–15.0)
Immature Granulocytes: 0 %
Lymphocytes Relative: 36 %
Lymphs Abs: 1.6 10*3/uL (ref 0.7–4.0)
MCH: 30.5 pg (ref 26.0–34.0)
MCHC: 31.9 g/dL (ref 30.0–36.0)
MCV: 95.7 fL (ref 80.0–100.0)
Monocytes Absolute: 0.3 10*3/uL (ref 0.1–1.0)
Monocytes Relative: 8 %
NRBC: 0 % (ref 0.0–0.2)
Neutro Abs: 2.3 10*3/uL (ref 1.7–7.7)
Neutrophils Relative %: 54 %
Platelet Count: 287 10*3/uL (ref 150–400)
RBC: 3.93 MIL/uL (ref 3.87–5.11)
RDW: 14.1 % (ref 11.5–15.5)
WBC Count: 4.3 10*3/uL (ref 4.0–10.5)

## 2018-05-25 LAB — CMP (CANCER CENTER ONLY)
ALT: 10 U/L (ref 0–44)
AST: 18 U/L (ref 15–41)
Albumin: 3.1 g/dL — ABNORMAL LOW (ref 3.5–5.0)
Alkaline Phosphatase: 124 U/L (ref 38–126)
Anion gap: 8 (ref 5–15)
BUN: 16 mg/dL (ref 8–23)
CO2: 24 mmol/L (ref 22–32)
Calcium: 8.8 mg/dL — ABNORMAL LOW (ref 8.9–10.3)
Chloride: 108 mmol/L (ref 98–111)
Creatinine: 0.78 mg/dL (ref 0.44–1.00)
GFR, Estimated: >60 mL/min (ref >60)
Glucose, Bld: 101 mg/dL — ABNORMAL HIGH (ref 70–99)
Potassium: 4.1 mmol/L (ref 3.5–5.1)
SODIUM: 140 mmol/L (ref 135–145)
Total Bilirubin: 0.5 mg/dL (ref 0.3–1.2)
Total Protein: 7.5 g/dL (ref 6.5–8.1)

## 2018-05-25 MED ORDER — SODIUM CHLORIDE 0.9% FLUSH
10.0000 mL | INTRAVENOUS | Status: DC | PRN
  Administered 2018-05-25: 10 mL
  Filled 2018-05-25: qty 10

## 2018-05-25 MED ORDER — HEPARIN SOD (PORK) LOCK FLUSH 100 UNIT/ML IV SOLN
500.0000 [IU] | Freq: Once | INTRAVENOUS | Status: AC | PRN
  Administered 2018-05-25: 500 [IU]
  Filled 2018-05-25: qty 5

## 2018-05-25 MED ORDER — SODIUM CHLORIDE 0.9 % IV SOLN
Freq: Once | INTRAVENOUS | Status: AC
  Administered 2018-05-25: 11:00:00 via INTRAVENOUS
  Filled 2018-05-25: qty 250

## 2018-05-25 MED ORDER — SODIUM CHLORIDE 0.9 % IV SOLN
480.0000 mg | Freq: Once | INTRAVENOUS | Status: AC
  Administered 2018-05-25: 480 mg via INTRAVENOUS
  Filled 2018-05-25: qty 48

## 2018-05-25 NOTE — Progress Notes (Signed)
Okay to treat with TSH 4.77, per [facility]

## 2018-05-25 NOTE — Patient Instructions (Signed)
[facility] Discharge Instructions for Patients Receiving Chemotherapy  Today you received the following chemotherapy agents Opdivo  To help prevent nausea and vomiting after your treatment, we encourage you to take your nausea medication as directed   If you develop nausea and vomiting that is not controlled by your nausea medication, call the clinic.   BELOW ARE SYMPTOMS THAT SHOULD BE REPORTED IMMEDIATELY:  *FEVER GREATER THAN 100.5 F  *CHILLS WITH OR WITHOUT FEVER  NAUSEA AND VOMITING THAT IS NOT CONTROLLED WITH YOUR NAUSEA MEDICATION  *UNUSUAL SHORTNESS OF BREATH  *UNUSUAL BRUISING OR BLEEDING  TENDERNESS IN MOUTH AND THROAT WITH OR WITHOUT PRESENCE OF ULCERS  *URINARY PROBLEMS  *BOWEL PROBLEMS  UNUSUAL RASH Items with * indicate a potential emergency and should be followed up as soon as possible.  Feel free to call the clinic should you have any questions or concerns. The clinic phone number is [phone].  Please show the CHEMO ALERT CARD at check-in to the Emergency Department and triage nurse.   

## 2018-05-25 NOTE — Progress Notes (Signed)
[place] [phone]   [phone]  OFFICE PROGRESS NOTE  [name], MD [place] [address]  DIAGNOSIS: Recurrent non-small cell lung cancer, adenocarcinoma initially diagnosed as stage IB (T2, N0, M0) in June 2008 with tumor size of 8.0 cm.  PRIOR THERAPY: #1 status post left upper lobectomy with lymph node dissection under the care of Dr. Arlyce Dice on 06/29/2006.  #2 status post 4 cycles of adjuvant chemotherapy with cisplatin and Taxotere last dose given 08/01/2006.  #3 status post 6 cycles of systemic chemotherapy with carboplatin and Alimta for disease recurrence last dose given 12/12/2009.  #4 palliative radiotherapy to the enlarging right upper lobe lung mass under the care of Dr. Pablo Ledger completed on 11/14/2015. #5 Maintenance systemic chemotherapy with single agent Alimta 400 MG/M2 every 3 weeks, status post 113 cycles.  CURRENT THERAPY: Second line treatment with immunotherapy with Nivolumab 480 mg IV every 4 weeks.  First dose 09/17/2017.  Status post 9 cycles.  INTERVAL HISTORY: [name] 84 y.o. female returns to the clinic today for follow-up visit.  The patient is feeling fine today with no specific complaints except for arthralgia in her hands as well as dry cough.  She continues to tolerate her treatment with nivolumab fairly well.  She denied having any chest pain, shortness of breath or hemoptysis.  She denied having any fever or chills.  She has no nausea, vomiting, diarrhea or constipation.  She has no headache or visual changes.  The patient had repeat CT scan of the chest performed recently and she is here for evaluation and discussion of her scan results.  MEDICAL HISTORY: Past Medical History:  Diagnosis Date  . FHx: chemotherapy 2008&2011   4 cycles cisplatin,taxotere,2008& carboplatin and Alimta 2011  . History of migraine headaches   . Hypercholesterolemia   . lung ca dx'd 06/2006   rt  and lt lung  . Lung cancer ([place]) 3/14/1   right- Adenocarcinoma w/bronchioalveolar features  . Radiation 11/01/15-11/14/15   right upper lobe 30 gray  . Radiation pneumonitis ([place]) 01/24/2016    ALLERGIES:  is allergic to simvastatin.  MEDICATIONS:  Current Outpatient Medications  Medication Sig Dispense Refill  . chlorpheniramine-HYDROcodone (TUSSIONEX) 10-8 MG/5ML SUER Take 5 mLs by mouth 2 (two) times daily as needed for cough. 140 mL 0  . Multiple Vitamins-Minerals (CENTRUM SILVER PO) Take 1 tablet by mouth daily. Reported on 12/13/2015    . vitamin C (ASCORBIC ACID) 500 MG tablet Take 500 mg by mouth daily.    Marland Kitchen albuterol (PROVENTIL HFA;VENTOLIN HFA) 108 (90 Base) MCG/ACT inhaler Inhale 1-2 puffs into the lungs every 6 (six) hours as needed for wheezing or shortness of breath. (Patient not taking: Reported on 04/28/2018) 1 Inhaler 2  . prochlorperazine (COMPAZINE) 10 MG tablet Take 1 tablet (10 mg total) by mouth every 6 (six) hours as needed. (Patient not taking: Reported on 01/07/2018) 15 tablet 1   No current facility-administered medications for this visit.     SURGICAL HISTORY:  Past Surgical History:  Procedure Laterality Date  . CATARACT EXTRACTION  2013   bilateral  . left  lowerlung lobectomy Left 06/19/2006   Dr.Burney,Left lower lobectomy  . Porta-cath  2011    REVIEW OF SYSTEMS:  Constitutional: negative Eyes: negative Ears, nose, mouth, throat, and face: negative Respiratory: positive for cough Cardiovascular: negative Gastrointestinal: negative Genitourinary:negative Integument/breast: negative Hematologic/lymphatic: negative Musculoskeletal:positive for arthralgias Neurological: negative Behavioral/Psych: negative Endocrine: negative Allergic/Immunologic: negative  PHYSICAL EXAMINATION: General appearance: alert, cooperative, fatigued and no distress Head: Normocephalic, without obvious abnormality, atraumatic Neck: no adenopathy, no JVD, supple,  symmetrical, trachea midline and thyroid not enlarged, symmetric, no tenderness/mass/nodules Lymph nodes: Cervical, supraclavicular, and axillary nodes normal. Resp: rales bilaterally Back: symmetric, no curvature. ROM normal. No CVA tenderness. Cardio: regular rate and rhythm, S1, S2 normal, no murmur, click, rub or gallop and normal apical impulse GI: soft, non-tender; bowel sounds normal; no masses,  no organomegaly Extremities: extremities normal, atraumatic, no cyanosis or edema Neurologic: Alert and oriented X 3, normal strength and tone. Normal symmetric reflexes. Normal coordination and gait  ECOG PERFORMANCE STATUS: 1 - Symptomatic but completely ambulatory  Blood pressure 127/65, pulse 86, temperature 97.7 F (36.5 C), temperature source Oral, resp. rate 18, height 5\' 2"  (1.575 m), weight 119 lb 12.8 oz (54.3 kg), SpO2 100 %.  LABORATORY DATA: Lab Results  Component Value Date   WBC 4.3 05/25/2018   HGB 12.0 05/25/2018   HCT 37.6 05/25/2018   MCV 95.7 05/25/2018   PLT 287 05/25/2018      Chemistry      Component Value Date/Time   NA 140 05/25/2018 0925   NA 142 05/28/2017 1015   K 4.1 05/25/2018 0925   K 4.2 05/28/2017 1015   CL 108 05/25/2018 0925   CL 108 (H) 11/04/2012 0947   CO2 24 05/25/2018 0925   CO2 23 05/28/2017 1015   BUN 16 05/25/2018 0925   BUN 14.2 05/28/2017 1015   CREATININE 0.78 05/25/2018 0925   CREATININE 0.9 05/28/2017 1015      Component Value Date/Time   CALCIUM 8.8 (L) 05/25/2018 0925   CALCIUM 9.0 05/28/2017 1015   ALKPHOS 124 05/25/2018 0925   ALKPHOS 84 05/28/2017 1015   AST 18 05/25/2018 0925   AST 19 05/28/2017 1015   ALT 10 05/25/2018 0925   ALT 7 05/28/2017 1015   BILITOT 0.5 05/25/2018 0925   BILITOT 0.54 05/28/2017 1015       RADIOGRAPHIC STUDIES: Ct Chest Wo Contrast  Result Date: 05/22/2018 CLINICAL DATA:  Lung cancer diagnosed in 2008. Radiation therapy completed in 2017. Chemotherapy ongoing. EXAM: CT CHEST  WITHOUT CONTRAST TECHNIQUE: Multidetector CT imaging of the chest was performed following the standard protocol without IV contrast. COMPARISON:  CT 02/24/2018 and 12/08/2017. FINDINGS: Cardiovascular: Stable atherosclerosis of the aorta, great vessels and coronary arteries. Right IJ Port-A-Cath extends to the superior cavoatrial junction. The heart size is normal. There is no pericardial effusion. Mediastinum/Nodes: Partially calcified central right lung mass and adjacent right hilar adenopathy are grossly stable. No other enlarged mediastinal, hilar or axillary lymph nodes are identified. Hilar assessment is limited by the lack of intravenous contrast, although the hilar contours appear unchanged. The thyroid gland, trachea and esophagus demonstrate no significant findings. Lungs/Pleura: There is a small amount of residual pleural fluid and pleural thickening bilaterally. The dominant, partially calcified right suprahilar mass is grossly stable, measuring 6.2 x 4.0 cm on image 45/5 (previously 6.1 x 4.0 cm). Stable right middle lobe collapse. Previous left lower lobe resection. Multifocal ill-defined nodular opacities are again noted in both lungs, waxing and waning on previous studies. Compared with the most recent study, the overall trend is toward improvement. For example, a 15 x 9 mm right paraspinal lower lobe nodule on image 71/5 previously measured up to 17 mm. Left perihilar nodule measures 18 x 16 mm on image 72/5, previously up to 21 mm. The dominant mass-like density posteriorly at the  left lung base is difficult to measure, but is subjectively smaller, measuring approximately 6.0 x 4.6 cm on image 111/5 (previously 6.1 x 4.4 cm). No progressive components or new nodules identified. Upper abdomen: The visualized upper abdomen appears stable without suspicious findings. Musculoskeletal/Chest wall: There is no chest wall mass or suspicious osseous finding. Old rib fractures or thoracotomy defects on the  left. Stable old Schmorl's nodes in the lower thoracic spine. IMPRESSION: 1. No significant change in dominant partially calcified right perihilar mass. 2. Improvement in the ill-defined nodularity elsewhere in the lungs, including the left basilar component, possibly inflammatory. No disease progression identified. Continued follow-up recommended. 3. Coronary and Aortic Atherosclerosis (ICD10-I70.0). Electronically Signed   By: Richardean Sale M.D.   On: 05/22/2018 14:11    ASSESSMENT AND PLAN:  This is a very pleasant 84 years old white female with recurrent non-small cell lung cancer, adenocarcinoma status post induction systemic chemotherapy with carboplatin and Alimta with partial response. She is currently on maintenance treatment with single agent Alimta status post 113 cycles. The patient has been tolerating this treatment well with no concerning complaints. Unfortunately restaging scan after cycle 113 showed evidence for disease progression. The patient was started on second line treatment with immunotherapy with Nivolumab 480 mg IV every 4 weeks status post 9 cycles. She has been tolerating this treatment well with no concerning adverse effect except for arthralgia. She had repeat CT scan of the chest performed recently.  I personally and independently reviewed the scan images and discussed the results with the patient today.  Her scan showed no concerning findings for disease progression. I recommended for the patient to proceed with cycle #10 today as scheduled. I will see her back for follow-up visit in 4 weeks for evaluation before starting cycle #11. For the hypothyroidism, we will continue to monitor her TSH closely and adjust her medication accordingly. The patient was advised to call immediately if she has any concerning symptoms in the interval. The patient voices understanding of current disease status and treatment options and is in agreement with the current care plan. All  questions were answered. The patient knows to call the clinic with any problems, questions or concerns. We can certainly see the patient much sooner if necessary.  Disclaimer: This note was dictated with voice recognition software. Similar sounding words can inadvertently be transcribed and may not be corrected upon review.

## 2018-05-25 NOTE — Telephone Encounter (Signed)
appts already scheduled until end of treatment plan per 12/23 los.

## 2018-05-26 ENCOUNTER — Other Ambulatory Visit: Payer: Medicare Other

## 2018-05-26 ENCOUNTER — Ambulatory Visit: Payer: Medicare Other | Admitting: Internal Medicine

## 2018-05-26 ENCOUNTER — Ambulatory Visit: Payer: Medicare Other

## 2018-06-24 ENCOUNTER — Inpatient Hospital Stay: Payer: Medicare Other

## 2018-06-24 ENCOUNTER — Inpatient Hospital Stay: Payer: Medicare Other | Attending: Internal Medicine

## 2018-06-24 ENCOUNTER — Inpatient Hospital Stay: Payer: Medicare Other | Admitting: Internal Medicine

## 2018-06-24 ENCOUNTER — Encounter: Payer: Self-pay | Admitting: Internal Medicine

## 2018-06-24 ENCOUNTER — Telehealth: Payer: Self-pay

## 2018-06-24 VITALS — BP 124/59 | HR 72 | Temp 97.4°F | Resp 18 | Ht 62.0 in | Wt 117.9 lb

## 2018-06-24 DIAGNOSIS — C3411 Malignant neoplasm of upper lobe, right bronchus or lung: Secondary | ICD-10-CM

## 2018-06-24 DIAGNOSIS — C3491 Malignant neoplasm of unspecified part of right bronchus or lung: Secondary | ICD-10-CM

## 2018-06-24 DIAGNOSIS — Z5112 Encounter for antineoplastic immunotherapy: Secondary | ICD-10-CM | POA: Insufficient documentation

## 2018-06-24 DIAGNOSIS — Z95828 Presence of other vascular implants and grafts: Secondary | ICD-10-CM

## 2018-06-24 DIAGNOSIS — R05 Cough: Secondary | ICD-10-CM | POA: Insufficient documentation

## 2018-06-24 DIAGNOSIS — R059 Cough, unspecified: Secondary | ICD-10-CM

## 2018-06-24 DIAGNOSIS — J700 Acute pulmonary manifestations due to radiation: Secondary | ICD-10-CM

## 2018-06-24 DIAGNOSIS — C3492 Malignant neoplasm of unspecified part of left bronchus or lung: Principal | ICD-10-CM

## 2018-06-24 LAB — CMP (CANCER CENTER ONLY)
ALT: 8 U/L (ref 0–44)
AST: 15 U/L (ref 15–41)
Albumin: 3 g/dL — ABNORMAL LOW (ref 3.5–5.0)
Alkaline Phosphatase: 112 U/L (ref 38–126)
Anion gap: 8 (ref 5–15)
BUN: 17 mg/dL (ref 8–23)
CO2: 25 mmol/L (ref 22–32)
Calcium: 8.6 mg/dL — ABNORMAL LOW (ref 8.9–10.3)
Chloride: 108 mmol/L (ref 98–111)
Creatinine: 0.76 mg/dL (ref 0.44–1.00)
Glucose, Bld: 104 mg/dL — ABNORMAL HIGH (ref 70–99)
Potassium: 4 mmol/L (ref 3.5–5.1)
Sodium: 141 mmol/L (ref 135–145)
Total Bilirubin: 0.4 mg/dL (ref 0.3–1.2)
Total Protein: 7.1 g/dL (ref 6.5–8.1)

## 2018-06-24 LAB — CBC WITH DIFFERENTIAL (CANCER CENTER ONLY)
Abs Immature Granulocytes: 0.01 10*3/uL (ref 0.00–0.07)
BASOS PCT: 1 %
Basophils Absolute: 0 10*3/uL (ref 0.0–0.1)
EOS ABS: 0.1 10*3/uL (ref 0.0–0.5)
Eosinophils Relative: 1 %
HCT: 37 % (ref 36.0–46.0)
Hemoglobin: 12.1 g/dL (ref 12.0–15.0)
Immature Granulocytes: 0 %
Lymphocytes Relative: 27 %
Lymphs Abs: 1.4 10*3/uL (ref 0.7–4.0)
MCH: 30.8 pg (ref 26.0–34.0)
MCHC: 32.7 g/dL (ref 30.0–36.0)
MCV: 94.1 fL (ref 80.0–100.0)
Monocytes Absolute: 0.4 10*3/uL (ref 0.1–1.0)
Monocytes Relative: 8 %
NRBC: 0 % (ref 0.0–0.2)
Neutro Abs: 3.1 10*3/uL (ref 1.7–7.7)
Neutrophils Relative %: 63 %
PLATELETS: 263 10*3/uL (ref 150–400)
RBC: 3.93 MIL/uL (ref 3.87–5.11)
RDW: 14.1 % (ref 11.5–15.5)
WBC Count: 5 10*3/uL (ref 4.0–10.5)

## 2018-06-24 MED ORDER — SODIUM CHLORIDE 0.9 % IV SOLN
480.0000 mg | Freq: Once | INTRAVENOUS | Status: AC
  Administered 2018-06-24: 480 mg via INTRAVENOUS
  Filled 2018-06-24: qty 48

## 2018-06-24 MED ORDER — HYDROCOD POLST-CPM POLST ER 10-8 MG/5ML PO SUER: 5.0000 mL | Freq: Two times a day (BID) | ORAL | 0 refills | Status: DC | PRN

## 2018-06-24 MED ORDER — SODIUM CHLORIDE 0.9% FLUSH
10.0000 mL | INTRAVENOUS | Status: DC | PRN
  Administered 2018-06-24: 10 mL
  Filled 2018-06-24: qty 10

## 2018-06-24 MED ORDER — HEPARIN SOD (PORK) LOCK FLUSH 100 UNIT/ML IV SOLN
500.0000 [IU] | Freq: Once | INTRAVENOUS | Status: AC | PRN
  Administered 2018-06-24: 500 [IU]
  Filled 2018-06-24: qty 5

## 2018-06-24 MED ORDER — SODIUM CHLORIDE 0.9 % IV SOLN
Freq: Once | INTRAVENOUS | Status: AC
  Administered 2018-06-24: 11:00:00 via INTRAVENOUS
  Filled 2018-06-24: qty 250

## 2018-06-24 NOTE — Telephone Encounter (Signed)
Printed avs and calender of upcoming appointment. Per 1/22 los

## 2018-06-24 NOTE — Patient Instructions (Signed)
[facility] Discharge Instructions for Patients Receiving Chemotherapy  Today you received the following chemotherapy agents Opdivo  To help prevent nausea and vomiting after your treatment, we encourage you to take your nausea medication as directed   If you develop nausea and vomiting that is not controlled by your nausea medication, call the clinic.   BELOW ARE SYMPTOMS THAT SHOULD BE REPORTED IMMEDIATELY:  *FEVER GREATER THAN 100.5 F  *CHILLS WITH OR WITHOUT FEVER  NAUSEA AND VOMITING THAT IS NOT CONTROLLED WITH YOUR NAUSEA MEDICATION  *UNUSUAL SHORTNESS OF BREATH  *UNUSUAL BRUISING OR BLEEDING  TENDERNESS IN MOUTH AND THROAT WITH OR WITHOUT PRESENCE OF ULCERS  *URINARY PROBLEMS  *BOWEL PROBLEMS  UNUSUAL RASH Items with * indicate a potential emergency and should be followed up as soon as possible.  Feel free to call the clinic should you have any questions or concerns. The clinic phone number is [phone].  Please show the CHEMO ALERT CARD at check-in to the Emergency Department and triage nurse.   

## 2018-06-24 NOTE — Progress Notes (Signed)
[place] Telephone:([phone]) [phone]   Fax:([phone]  OFFICE PROGRESS NOTE  [name], MD [place] [address]  DIAGNOSIS: Recurrent non-small cell lung cancer, adenocarcinoma initially diagnosed as stage IB (T2, N0, M0) in June 2008 with tumor size of 8.0 cm.  PRIOR THERAPY: #1 status post left upper lobectomy with lymph node dissection under the care of Dr. Arlyce Dice on 06/29/2006.  #2 status post 4 cycles of adjuvant chemotherapy with cisplatin and Taxotere last dose given 08/01/2006.  #3 status post 6 cycles of systemic chemotherapy with carboplatin and Alimta for disease recurrence last dose given 12/12/2009.  #4 palliative radiotherapy to the enlarging right upper lobe lung mass under the care of Dr. Pablo Ledger completed on 11/14/2015. #5 Maintenance systemic chemotherapy with single agent Alimta 400 MG/M2 every 3 weeks, status post 113 cycles.  CURRENT THERAPY: Second line treatment with immunotherapy with Nivolumab 480 mg IV every 4 weeks.  First dose 09/17/2017.  Status post 10 cycles.  INTERVAL HISTORY: [name] 85 y.o. female returns to the clinic today for follow-up visit.  The patient is feeling fine today with no concerning complaints except for the persistent dry cough and she is requesting refill of Tussionex.  She denied having any chest pain but has shortness of breath with exertion with no hemoptysis.  She denied having any recent weight loss or night sweats.  She has no nausea, vomiting, diarrhea or constipation.  She has no headache or visual changes.  She is here today for evaluation before starting cycle #11 of her treatment.   MEDICAL HISTORY: Past Medical History:  Diagnosis Date  . FHx: chemotherapy 2008&2011   4 cycles cisplatin,taxotere,2008& carboplatin and Alimta 2011  . History of migraine headaches   . Hypercholesterolemia   . lung ca dx'd 06/2006   rt and lt lung  . Lung cancer ([place]) 3/14/1   right-  Adenocarcinoma w/bronchioalveolar features  . Radiation 11/01/15-11/14/15   right upper lobe 30 gray  . Radiation pneumonitis ([place]) 01/24/2016    ALLERGIES:  is allergic to simvastatin.  MEDICATIONS:  Current Outpatient Medications  Medication Sig Dispense Refill  . albuterol (PROVENTIL HFA;VENTOLIN HFA) 108 (90 Base) MCG/ACT inhaler Inhale 1-2 puffs into the lungs every 6 (six) hours as needed for wheezing or shortness of breath. (Patient not taking: Reported on 04/28/2018) 1 Inhaler 2  . chlorpheniramine-HYDROcodone (TUSSIONEX) 10-8 MG/5ML SUER Take 5 mLs by mouth 2 (two) times daily as needed for cough. 140 mL 0  . Multiple Vitamins-Minerals (CENTRUM SILVER PO) Take 1 tablet by mouth daily. Reported on 12/13/2015    . prochlorperazine (COMPAZINE) 10 MG tablet Take 1 tablet (10 mg total) by mouth every 6 (six) hours as needed. (Patient not taking: Reported on 01/07/2018) 15 tablet 1  . vitamin C (ASCORBIC ACID) 500 MG tablet Take 500 mg by mouth daily.     No current facility-administered medications for this visit.     SURGICAL HISTORY:  Past Surgical History:  Procedure Laterality Date  . CATARACT EXTRACTION  2013   bilateral  . left  lowerlung lobectomy Left 06/19/2006   Dr.Burney,Left lower lobectomy  . Porta-cath  2011    REVIEW OF SYSTEMS:  A comprehensive review of systems was negative except for: Constitutional: positive for fatigue Respiratory: positive for cough   PHYSICAL EXAMINATION: General appearance: alert, cooperative, fatigued and no distress Head: Normocephalic, without obvious abnormality, atraumatic Neck: no adenopathy, no JVD, supple, symmetrical, trachea midline and  thyroid not enlarged, symmetric, no tenderness/mass/nodules Lymph nodes: Cervical, supraclavicular, and axillary nodes normal. Resp: rales bilaterally Back: symmetric, no curvature. ROM normal. No CVA tenderness. Cardio: regular rate and rhythm, S1, S2 normal, no murmur, click, rub or gallop and  normal apical impulse GI: soft, non-tender; bowel sounds normal; no masses,  no organomegaly Extremities: extremities normal, atraumatic, no cyanosis or edema  ECOG PERFORMANCE STATUS: 1 - Symptomatic but completely ambulatory  Blood pressure (!) 124/59, pulse 72, temperature (!) 97.4 F (36.3 C), temperature source Oral, resp. rate 18, height 5\' 2"  (1.575 m), weight 117 lb 14.4 oz (53.5 kg), SpO2 100 %.  LABORATORY DATA: Lab Results  Component Value Date   WBC 5.0 06/24/2018   HGB 12.1 06/24/2018   HCT 37.0 06/24/2018   MCV 94.1 06/24/2018   PLT 263 06/24/2018      Chemistry      Component Value Date/Time   NA 140 05/25/2018 0925   NA 142 05/28/2017 1015   K 4.1 05/25/2018 0925   K 4.2 05/28/2017 1015   CL 108 05/25/2018 0925   CL 108 (H) 11/04/2012 0947   CO2 24 05/25/2018 0925   CO2 23 05/28/2017 1015   BUN 16 05/25/2018 0925   BUN 14.2 05/28/2017 1015   CREATININE 0.78 05/25/2018 0925   CREATININE 0.9 05/28/2017 1015      Component Value Date/Time   CALCIUM 8.8 (L) 05/25/2018 0925   CALCIUM 9.0 05/28/2017 1015   ALKPHOS 124 05/25/2018 0925   ALKPHOS 84 05/28/2017 1015   AST 18 05/25/2018 0925   AST 19 05/28/2017 1015   ALT 10 05/25/2018 0925   ALT 7 05/28/2017 1015   BILITOT 0.5 05/25/2018 0925   BILITOT 0.54 05/28/2017 1015       RADIOGRAPHIC STUDIES: No results found.  ASSESSMENT AND PLAN:  This is a very pleasant 85 years old white female with recurrent non-small cell lung cancer, adenocarcinoma status post induction systemic chemotherapy with carboplatin and Alimta with partial response. She is currently on maintenance treatment with single agent Alimta status post 113 cycles. The patient has been tolerating this treatment well with no concerning complaints. Unfortunately restaging scan after cycle 113 showed evidence for disease progression. The patient was started on second line treatment with immunotherapy with Nivolumab 480 mg IV every 4 weeks  status post 10 cycles. The patient continues to tolerate this treatment well with no concerning complaints. I recommended for her to proceed with cycle #11 today as scheduled. I will see her back for follow-up visit in 4 weeks for evaluation with the start of cycle #12. For the dry cough, I gave her a refill of Tussionex. The patient was advised to call immediately if she has any concerning symptoms in the interval. The patient voices understanding of current disease status and treatment options and is in agreement with the current care plan. All questions were answered. The patient knows to call the clinic with any problems, questions or concerns. We can certainly see the patient much sooner if necessary.  Disclaimer: This note was dictated with voice recognition software. Similar sounding words can inadvertently be transcribed and may not be corrected upon review.

## 2018-07-21 ENCOUNTER — Inpatient Hospital Stay: Payer: Medicare Other | Admitting: Internal Medicine

## 2018-07-21 ENCOUNTER — Inpatient Hospital Stay: Payer: Medicare Other

## 2018-07-21 ENCOUNTER — Telehealth: Payer: Self-pay | Admitting: Internal Medicine

## 2018-07-21 ENCOUNTER — Inpatient Hospital Stay: Payer: Medicare Other | Attending: Internal Medicine

## 2018-07-21 ENCOUNTER — Encounter: Payer: Self-pay | Admitting: Internal Medicine

## 2018-07-21 VITALS — BP 138/73 | HR 79 | Temp 97.6°F | Resp 18 | Ht 62.0 in | Wt 118.2 lb

## 2018-07-21 DIAGNOSIS — Z5112 Encounter for antineoplastic immunotherapy: Secondary | ICD-10-CM | POA: Diagnosis present

## 2018-07-21 DIAGNOSIS — C3491 Malignant neoplasm of unspecified part of right bronchus or lung: Secondary | ICD-10-CM

## 2018-07-21 DIAGNOSIS — Z79899 Other long term (current) drug therapy: Secondary | ICD-10-CM | POA: Insufficient documentation

## 2018-07-21 DIAGNOSIS — C3492 Malignant neoplasm of unspecified part of left bronchus or lung: Principal | ICD-10-CM

## 2018-07-21 DIAGNOSIS — C3411 Malignant neoplasm of upper lobe, right bronchus or lung: Secondary | ICD-10-CM

## 2018-07-21 DIAGNOSIS — C3490 Malignant neoplasm of unspecified part of unspecified bronchus or lung: Secondary | ICD-10-CM

## 2018-07-21 DIAGNOSIS — Z95828 Presence of other vascular implants and grafts: Secondary | ICD-10-CM

## 2018-07-21 LAB — CBC WITH DIFFERENTIAL (CANCER CENTER ONLY)
Abs Immature Granulocytes: 0.01 10*3/uL (ref 0.00–0.07)
Basophils Absolute: 0 10*3/uL (ref 0.0–0.1)
Basophils Relative: 0 %
EOS PCT: 1 %
Eosinophils Absolute: 0 10*3/uL (ref 0.0–0.5)
HCT: 39.6 % (ref 36.0–46.0)
Hemoglobin: 12.7 g/dL (ref 12.0–15.0)
Immature Granulocytes: 0 %
Lymphocytes Relative: 29 %
Lymphs Abs: 1.5 10*3/uL (ref 0.7–4.0)
MCH: 30.6 pg (ref 26.0–34.0)
MCHC: 32.1 g/dL (ref 30.0–36.0)
MCV: 95.4 fL (ref 80.0–100.0)
Monocytes Absolute: 0.4 10*3/uL (ref 0.1–1.0)
Monocytes Relative: 8 %
Neutro Abs: 3.2 10*3/uL (ref 1.7–7.7)
Neutrophils Relative %: 62 %
Platelet Count: 254 10*3/uL (ref 150–400)
RBC: 4.15 MIL/uL (ref 3.87–5.11)
RDW: 14.7 % (ref 11.5–15.5)
WBC: 5.2 10*3/uL (ref 4.0–10.5)
nRBC: 0 % (ref 0.0–0.2)

## 2018-07-21 LAB — CMP (CANCER CENTER ONLY)
ALK PHOS: 121 U/L (ref 38–126)
ALT: 6 U/L (ref 0–44)
AST: 16 U/L (ref 15–41)
Albumin: 3.1 g/dL — ABNORMAL LOW (ref 3.5–5.0)
Anion gap: 8 (ref 5–15)
BUN: 15 mg/dL (ref 8–23)
CO2: 24 mmol/L (ref 22–32)
Calcium: 8.7 mg/dL — ABNORMAL LOW (ref 8.9–10.3)
Chloride: 107 mmol/L (ref 98–111)
Creatinine: 0.82 mg/dL (ref 0.44–1.00)
GFR, Est AFR Am: >60 mL/min (ref >60)
GFR, Estimated: >60 mL/min (ref >60)
Glucose, Bld: 133 mg/dL — ABNORMAL HIGH (ref 70–99)
Potassium: 3.9 mmol/L (ref 3.5–5.1)
Sodium: 139 mmol/L (ref 135–145)
Total Bilirubin: 0.5 mg/dL (ref 0.3–1.2)
Total Protein: 7.4 g/dL (ref 6.5–8.1)

## 2018-07-21 MED ORDER — SODIUM CHLORIDE 0.9% FLUSH
10.0000 mL | INTRAVENOUS | Status: DC | PRN
  Administered 2018-07-21: 10 mL
  Filled 2018-07-21: qty 10

## 2018-07-21 MED ORDER — SODIUM CHLORIDE 0.9 % IV SOLN
Freq: Once | INTRAVENOUS | Status: AC
  Administered 2018-07-21: 11:00:00 via INTRAVENOUS
  Filled 2018-07-21: qty 250

## 2018-07-21 MED ORDER — HEPARIN SOD (PORK) LOCK FLUSH 100 UNIT/ML IV SOLN
500.0000 [IU] | Freq: Once | INTRAVENOUS | Status: AC | PRN
  Administered 2018-07-21: 500 [IU]
  Filled 2018-07-21: qty 5

## 2018-07-21 MED ORDER — SODIUM CHLORIDE 0.9 % IV SOLN
480.0000 mg | Freq: Once | INTRAVENOUS | Status: AC
  Administered 2018-07-21: 480 mg via INTRAVENOUS
  Filled 2018-07-21: qty 48

## 2018-07-21 NOTE — Patient Instructions (Signed)
[facility] Discharge Instructions for Patients Receiving Chemotherapy  Today you received the following chemotherapy agents Opdivo  To help prevent nausea and vomiting after your treatment, we encourage you to take your nausea medication as directed   If you develop nausea and vomiting that is not controlled by your nausea medication, call the clinic.   BELOW ARE SYMPTOMS THAT SHOULD BE REPORTED IMMEDIATELY:  *FEVER GREATER THAN 100.5 F  *CHILLS WITH OR WITHOUT FEVER  NAUSEA AND VOMITING THAT IS NOT CONTROLLED WITH YOUR NAUSEA MEDICATION  *UNUSUAL SHORTNESS OF BREATH  *UNUSUAL BRUISING OR BLEEDING  TENDERNESS IN MOUTH AND THROAT WITH OR WITHOUT PRESENCE OF ULCERS  *URINARY PROBLEMS  *BOWEL PROBLEMS  UNUSUAL RASH Items with * indicate a potential emergency and should be followed up as soon as possible.  Feel free to call the clinic should you have any questions or concerns. The clinic phone number is [phone].  Please show the CHEMO ALERT CARD at check-in to the Emergency Department and triage nurse.   

## 2018-07-21 NOTE — Telephone Encounter (Signed)
Scheduled appt per 2/18 los - pt to get an updated schedule next visit.

## 2018-07-21 NOTE — Progress Notes (Signed)
[place] [phone]   [phone]) [phone]  OFFICE PROGRESS NOTE  [name], MD [place] [address]  DIAGNOSIS: Recurrent non-small cell lung cancer, adenocarcinoma initially diagnosed as stage IB (T2, N0, M0) in June 2008 with tumor size of 8.0 cm.  PRIOR THERAPY: #1 status post left upper lobectomy with lymph node dissection under the care of Dr. Arlyce Dice on 06/29/2006.  #2 status post 4 cycles of adjuvant chemotherapy with cisplatin and Taxotere last dose given 08/01/2006.  #3 status post 6 cycles of systemic chemotherapy with carboplatin and Alimta for disease recurrence last dose given 12/12/2009.  #4 palliative radiotherapy to the enlarging right upper lobe lung mass under the care of Dr. Pablo Ledger completed on 11/14/2015. #5 Maintenance systemic chemotherapy with single agent Alimta 400 MG/M2 every 3 weeks, status post 113 cycles.  CURRENT THERAPY: Second line treatment with immunotherapy with Nivolumab 480 mg IV every 4 weeks.  First dose 09/17/2017.  Status post 11 cycles.  INTERVAL HISTORY: [name] 85 y.o. female returns to the clinic today for follow-up visit.  The patient is feeling fine today with no concerning complaints except for the persistent dry cough.  She denied having any chest pain, shortness of breath or hemoptysis.  She denied having any weight loss or night sweats.  She has no nausea, vomiting, diarrhea or constipation.  She has no headache or visual changes.  She is here today for evaluation before starting cycle #12.  MEDICAL HISTORY: Past Medical History:  Diagnosis Date  . FHx: chemotherapy 2008&2011   4 cycles cisplatin,taxotere,2008& carboplatin and Alimta 2011  . History of migraine headaches   . Hypercholesterolemia   . lung ca dx'd 06/2006   rt and lt lung  . Lung cancer ([place]) 3/14/1   right- Adenocarcinoma w/bronchioalveolar features  . Radiation 11/01/15-11/14/15   right upper lobe  30 gray  . Radiation pneumonitis ([place]) 01/24/2016    ALLERGIES:  is allergic to simvastatin.  MEDICATIONS:  Current Outpatient Medications  Medication Sig Dispense Refill  . albuterol (PROVENTIL HFA;VENTOLIN HFA) 108 (90 Base) MCG/ACT inhaler Inhale 1-2 puffs into the lungs every 6 (six) hours as needed for wheezing or shortness of breath. (Patient not taking: Reported on 04/28/2018) 1 Inhaler 2  . chlorpheniramine-HYDROcodone (TUSSIONEX) 10-8 MG/5ML SUER Take 5 mLs by mouth 2 (two) times daily as needed for cough. 140 mL 0  . Multiple Vitamins-Minerals (CENTRUM SILVER PO) Take 1 tablet by mouth daily. Reported on 12/13/2015    . prochlorperazine (COMPAZINE) 10 MG tablet Take 1 tablet (10 mg total) by mouth every 6 (six) hours as needed. (Patient not taking: Reported on 01/07/2018) 15 tablet 1  . vitamin C (ASCORBIC ACID) 500 MG tablet Take 500 mg by mouth daily.     No current facility-administered medications for this visit.     SURGICAL HISTORY:  Past Surgical History:  Procedure Laterality Date  . CATARACT EXTRACTION  2013   bilateral  . left  lowerlung lobectomy Left 06/19/2006   Dr.Burney,Left lower lobectomy  . Porta-cath  2011    REVIEW OF SYSTEMS:  A comprehensive review of systems was negative except for: Respiratory: positive for cough   PHYSICAL EXAMINATION: General appearance: alert, cooperative and no distress Head: Normocephalic, without obvious abnormality, atraumatic Neck: no adenopathy, no JVD, supple, symmetrical, trachea midline and thyroid not enlarged, symmetric, no tenderness/mass/nodules Lymph nodes: Cervical, supraclavicular, and axillary nodes normal. Resp: rales bilaterally Back: symmetric, no curvature. ROM  normal. No CVA tenderness. Cardio: regular rate and rhythm, S1, S2 normal, no murmur, click, rub or gallop and normal apical impulse GI: soft, non-tender; bowel sounds normal; no masses,  no organomegaly Extremities: extremities normal, atraumatic, no  cyanosis or edema  ECOG PERFORMANCE STATUS: 1 - Symptomatic but completely ambulatory  Blood pressure 138/73, pulse 79, temperature 97.6 F (36.4 C), temperature source Oral, resp. rate 18, height 5\' 2"  (1.575 m), weight 118 lb 3.2 oz (53.6 kg).  LABORATORY DATA: Lab Results  Component Value Date   WBC 5.2 07/21/2018   HGB 12.7 07/21/2018   HCT 39.6 07/21/2018   MCV 95.4 07/21/2018   PLT 254 07/21/2018      Chemistry      Component Value Date/Time   NA 141 06/24/2018 0928   NA 142 05/28/2017 1015   K 4.0 06/24/2018 0928   K 4.2 05/28/2017 1015   CL 108 06/24/2018 0928   CL 108 (H) 11/04/2012 0947   CO2 25 06/24/2018 0928   CO2 23 05/28/2017 1015   BUN 17 06/24/2018 0928   BUN 14.2 05/28/2017 1015   CREATININE 0.76 06/24/2018 0928   CREATININE 0.9 05/28/2017 1015      Component Value Date/Time   CALCIUM 8.6 (L) 06/24/2018 0928   CALCIUM 9.0 05/28/2017 1015   ALKPHOS 112 06/24/2018 0928   ALKPHOS 84 05/28/2017 1015   AST 15 06/24/2018 0928   AST 19 05/28/2017 1015   ALT 8 06/24/2018 0928   ALT 7 05/28/2017 1015   BILITOT 0.4 06/24/2018 0928   BILITOT 0.54 05/28/2017 1015       RADIOGRAPHIC STUDIES: No results found.  ASSESSMENT AND PLAN:  This is a very pleasant 85 years old white female with recurrent non-small cell lung cancer, adenocarcinoma status post induction systemic chemotherapy with carboplatin and Alimta with partial response. She is currently on maintenance treatment with single agent Alimta status post 113 cycles. The patient has been tolerating this treatment well with no concerning complaints. Unfortunately restaging scan after cycle 113 showed evidence for disease progression. The patient was started on second line treatment with immunotherapy with Nivolumab 480 mg IV every 4 weeks status post 11 cycles. The patient is feeling fine today with no concerning complaints except for the dry cough started after her previous palliative  radiotherapy. She is tolerating her treatment with immunotherapy fairly well. I recommended for her to proceed with cycle #12 today as scheduled. I will see her back for follow-up visit in 4 weeks for evaluation with repeat CT scan of the chest before starting cycle #13. The patient was advised to call immediately if she has any concerning symptoms in the interval. The patient voices understanding of current disease status and treatment options and is in agreement with the current care plan. All questions were answered. The patient knows to call the clinic with any problems, questions or concerns. We can certainly see the patient much sooner if necessary.  Disclaimer: This note was dictated with voice recognition software. Similar sounding words can inadvertently be transcribed and may not be corrected upon review.

## 2018-07-22 LAB — TSH: TSH: 5.092 u[IU]/mL — AB (ref 0.308–3.960)

## 2018-08-17 ENCOUNTER — Other Ambulatory Visit: Payer: Self-pay

## 2018-08-17 ENCOUNTER — Ambulatory Visit (HOSPITAL_COMMUNITY)
Admission: RE | Admit: 2018-08-17 | Discharge: 2018-08-17 | Disposition: A | Discharge status: Home / Self Care | Payer: Medicare Other | Source: Ambulatory Visit | Attending: Internal Medicine | Admitting: Internal Medicine

## 2018-08-17 DIAGNOSIS — C3490 Malignant neoplasm of unspecified part of unspecified bronchus or lung: Secondary | ICD-10-CM | POA: Diagnosis present

## 2018-08-19 ENCOUNTER — Inpatient Hospital Stay: Payer: Medicare Other

## 2018-08-19 ENCOUNTER — Encounter: Payer: Self-pay | Admitting: Internal Medicine

## 2018-08-19 ENCOUNTER — Other Ambulatory Visit: Payer: Self-pay | Admitting: Medical Oncology

## 2018-08-19 ENCOUNTER — Telehealth: Payer: Self-pay | Admitting: Medical Oncology

## 2018-08-19 ENCOUNTER — Other Ambulatory Visit: Payer: Self-pay

## 2018-08-19 ENCOUNTER — Other Ambulatory Visit: Payer: Self-pay | Admitting: Internal Medicine

## 2018-08-19 ENCOUNTER — Inpatient Hospital Stay: Payer: Medicare Other | Attending: Internal Medicine | Admitting: Internal Medicine

## 2018-08-19 VITALS — BP 130/72 | HR 73 | Temp 97.9°F | Resp 20 | Ht 62.0 in | Wt 117.3 lb

## 2018-08-19 DIAGNOSIS — C3491 Malignant neoplasm of unspecified part of right bronchus or lung: Secondary | ICD-10-CM

## 2018-08-19 DIAGNOSIS — C3492 Malignant neoplasm of unspecified part of left bronchus or lung: Principal | ICD-10-CM

## 2018-08-19 DIAGNOSIS — R05 Cough: Secondary | ICD-10-CM | POA: Diagnosis not present

## 2018-08-19 DIAGNOSIS — C3490 Malignant neoplasm of unspecified part of unspecified bronchus or lung: Secondary | ICD-10-CM

## 2018-08-19 DIAGNOSIS — R059 Cough, unspecified: Secondary | ICD-10-CM

## 2018-08-19 DIAGNOSIS — Z5112 Encounter for antineoplastic immunotherapy: Secondary | ICD-10-CM

## 2018-08-19 DIAGNOSIS — J700 Acute pulmonary manifestations due to radiation: Secondary | ICD-10-CM

## 2018-08-19 DIAGNOSIS — C3411 Malignant neoplasm of upper lobe, right bronchus or lung: Secondary | ICD-10-CM | POA: Diagnosis not present

## 2018-08-19 DIAGNOSIS — Z79899 Other long term (current) drug therapy: Secondary | ICD-10-CM | POA: Diagnosis not present

## 2018-08-19 DIAGNOSIS — Z95828 Presence of other vascular implants and grafts: Secondary | ICD-10-CM

## 2018-08-19 LAB — CBC WITH DIFFERENTIAL (CANCER CENTER ONLY)
ABS IMMATURE GRANULOCYTES: 0 10*3/uL (ref 0.00–0.07)
BASOS PCT: 0 %
Basophils Absolute: 0 10*3/uL (ref 0.0–0.1)
Eosinophils Absolute: 0.1 10*3/uL (ref 0.0–0.5)
Eosinophils Relative: 1 %
HCT: 38.3 % (ref 36.0–46.0)
Hemoglobin: 12.4 g/dL (ref 12.0–15.0)
Immature Granulocytes: 0 %
Lymphocytes Relative: 37 %
Lymphs Abs: 1.7 10*3/uL (ref 0.7–4.0)
MCH: 31.2 pg (ref 26.0–34.0)
MCHC: 32.4 g/dL (ref 30.0–36.0)
MCV: 96.2 fL (ref 80.0–100.0)
Monocytes Absolute: 0.4 10*3/uL (ref 0.1–1.0)
Monocytes Relative: 8 %
Neutro Abs: 2.4 10*3/uL (ref 1.7–7.7)
Neutrophils Relative %: 54 %
Platelet Count: 137 10*3/uL — ABNORMAL LOW (ref 150–400)
RBC: 3.98 MIL/uL (ref 3.87–5.11)
RDW: 14.6 % (ref 11.5–15.5)
WBC Count: 4.5 10*3/uL (ref 4.0–10.5)
nRBC: 0 % (ref 0.0–0.2)

## 2018-08-19 LAB — CMP (CANCER CENTER ONLY)
ALT: 9 U/L (ref 0–44)
AST: 20 U/L (ref 15–41)
Albumin: 3.1 g/dL — ABNORMAL LOW (ref 3.5–5.0)
Alkaline Phosphatase: 116 U/L (ref 38–126)
Anion gap: 9 (ref 5–15)
BILIRUBIN TOTAL: 0.5 mg/dL (ref 0.3–1.2)
BUN: 18 mg/dL (ref 8–23)
CO2: 23 mmol/L (ref 22–32)
Calcium: 8.7 mg/dL — ABNORMAL LOW (ref 8.9–10.3)
Chloride: 107 mmol/L (ref 98–111)
Creatinine: 0.84 mg/dL (ref 0.44–1.00)
GFR, Est AFR Am: >60 mL/min (ref >60)
GFR, Estimated: >60 mL/min (ref >60)
Glucose, Bld: 103 mg/dL — ABNORMAL HIGH (ref 70–99)
POTASSIUM: 4.1 mmol/L (ref 3.5–5.1)
Sodium: 139 mmol/L (ref 135–145)
TOTAL PROTEIN: 7.4 g/dL (ref 6.5–8.1)

## 2018-08-19 LAB — TSH: TSH: 3.571 u[IU]/mL (ref 0.308–3.960)

## 2018-08-19 MED ORDER — HYDROCOD POLST-CPM POLST ER 10-8 MG/5ML PO SUER: 5.0000 mL | Freq: Two times a day (BID) | ORAL | 0 refills | Status: DC | PRN

## 2018-08-19 MED ORDER — SODIUM CHLORIDE 0.9% FLUSH
10.0000 mL | INTRAVENOUS | Status: DC | PRN
  Administered 2018-08-19: 10 mL
  Filled 2018-08-19: qty 10

## 2018-08-19 MED ORDER — SODIUM CHLORIDE 0.9 % IV SOLN
Freq: Once | INTRAVENOUS | Status: AC
  Administered 2018-08-19: 13:00:00 via INTRAVENOUS
  Filled 2018-08-19: qty 250

## 2018-08-19 MED ORDER — SODIUM CHLORIDE 0.9 % IV SOLN
480.0000 mg | Freq: Once | INTRAVENOUS | Status: AC
  Administered 2018-08-19: 480 mg via INTRAVENOUS
  Filled 2018-08-19: qty 48

## 2018-08-19 MED ORDER — HEPARIN SOD (PORK) LOCK FLUSH 100 UNIT/ML IV SOLN
500.0000 [IU] | Freq: Once | INTRAVENOUS | Status: AC | PRN
  Administered 2018-08-19: 500 [IU]
  Filled 2018-08-19: qty 5

## 2018-08-19 NOTE — Telephone Encounter (Signed)
Cough med refill requested.

## 2018-08-19 NOTE — Patient Instructions (Signed)
[facility] Discharge Instructions for Patients Receiving Chemotherapy  Today you received the following chemotherapy agents Opdivo  To help prevent nausea and vomiting after your treatment, we encourage you to take your nausea medication as directed   If you develop nausea and vomiting that is not controlled by your nausea medication, call the clinic.   BELOW ARE SYMPTOMS THAT SHOULD BE REPORTED IMMEDIATELY:  *FEVER GREATER THAN 100.5 F  *CHILLS WITH OR WITHOUT FEVER  NAUSEA AND VOMITING THAT IS NOT CONTROLLED WITH YOUR NAUSEA MEDICATION  *UNUSUAL SHORTNESS OF BREATH  *UNUSUAL BRUISING OR BLEEDING  TENDERNESS IN MOUTH AND THROAT WITH OR WITHOUT PRESENCE OF ULCERS  *URINARY PROBLEMS  *BOWEL PROBLEMS  UNUSUAL RASH Items with * indicate a potential emergency and should be followed up as soon as possible.  Feel free to call the clinic should you have any questions or concerns. The clinic phone number is [phone].  Please show the CHEMO ALERT CARD at check-in to the Emergency Department and triage nurse.   

## 2018-08-19 NOTE — Progress Notes (Signed)
[place] Telephone:(336) [phone]   Fax:([phone]  OFFICE PROGRESS NOTE  [name], MD [place] [address]  DIAGNOSIS: Recurrent non-small cell lung cancer, adenocarcinoma initially diagnosed as stage IB (T2, N0, M0) in June 2008 with tumor size of 8.0 cm.  PRIOR THERAPY: #1 status post left upper lobectomy with lymph node dissection under the care of Dr. Arlyce Dice on 06/29/2006.  #2 status post 4 cycles of adjuvant chemotherapy with cisplatin and Taxotere last dose given 08/01/2006.  #3 status post 6 cycles of systemic chemotherapy with carboplatin and Alimta for disease recurrence last dose given 12/12/2009.  #4 palliative radiotherapy to the enlarging right upper lobe lung mass under the care of Dr. Pablo Ledger completed on 11/14/2015. #5 Maintenance systemic chemotherapy with single agent Alimta 400 MG/M2 every 3 weeks, status post 113 cycles.  CURRENT THERAPY: Second line treatment with immunotherapy with Nivolumab 480 mg IV every 4 weeks.  First dose 09/17/2017.  Status post 12 cycles.  INTERVAL HISTORY: [name] 85 y.o. female returns to the clinic today for follow-up visit.  The patient is feeling fine today with no concerning complaints except for the persistent dry cough.  She denied having any chest pain, shortness of breath or hemoptysis.  She denied having any recent weight loss or night sweats.  She has no nausea, vomiting, diarrhea or constipation.  She continues to have arthralgia of the lower extremities.  She has no headache or visual changes.  She had repeat CT scan of the chest performed recently and she is here for evaluation and discussion of her scan results.  MEDICAL HISTORY: Past Medical History:  Diagnosis Date  . FHx: chemotherapy 2008&2011   4 cycles cisplatin,taxotere,2008& carboplatin and Alimta 2011  . History of migraine headaches   . Hypercholesterolemia   . lung ca dx'd 06/2006   rt and lt lung  .  Lung cancer ([place]) 3/14/1   right- Adenocarcinoma w/bronchioalveolar features  . Radiation 11/01/15-11/14/15   right upper lobe 30 gray  . Radiation pneumonitis ([place]) 01/24/2016    ALLERGIES:  is allergic to simvastatin.  MEDICATIONS:  Current Outpatient Medications  Medication Sig Dispense Refill  . albuterol (PROVENTIL HFA;VENTOLIN HFA) 108 (90 Base) MCG/ACT inhaler Inhale 1-2 puffs into the lungs every 6 (six) hours as needed for wheezing or shortness of breath. (Patient not taking: Reported on 04/28/2018) 1 Inhaler 2  . chlorpheniramine-HYDROcodone (TUSSIONEX) 10-8 MG/5ML SUER Take 5 mLs by mouth 2 (two) times daily as needed for cough. 140 mL 0  . Multiple Vitamins-Minerals (CENTRUM SILVER PO) Take 1 tablet by mouth daily. Reported on 12/13/2015    . prochlorperazine (COMPAZINE) 10 MG tablet Take 1 tablet (10 mg total) by mouth every 6 (six) hours as needed. (Patient not taking: Reported on 01/07/2018) 15 tablet 1  . vitamin C (ASCORBIC ACID) 500 MG tablet Take 500 mg by mouth daily.     No current facility-administered medications for this visit.     SURGICAL HISTORY:  Past Surgical History:  Procedure Laterality Date  . CATARACT EXTRACTION  2013   bilateral  . left  lowerlung lobectomy Left 06/19/2006   Dr.Burney,Left lower lobectomy  . Porta-cath  2011    REVIEW OF SYSTEMS:  Constitutional: positive for fatigue Eyes: negative Ears, nose, mouth, throat, and face: negative Respiratory: positive for cough Cardiovascular: negative Gastrointestinal: negative Genitourinary:negative Integument/breast: negative Hematologic/lymphatic: negative Musculoskeletal:positive for arthralgias Neurological: negative Behavioral/Psych: negative Endocrine: negative Allergic/Immunologic: negative  PHYSICAL EXAMINATION: General appearance: alert, cooperative, fatigued and no distress Head: Normocephalic, without obvious abnormality, atraumatic Neck: no adenopathy, no JVD, supple,  symmetrical, trachea midline and thyroid not enlarged, symmetric, no tenderness/mass/nodules Lymph nodes: Cervical, supraclavicular, and axillary nodes normal. Resp: rales bilaterally Back: symmetric, no curvature. ROM normal. No CVA tenderness. Cardio: regular rate and rhythm, S1, S2 normal, no murmur, click, rub or gallop and normal apical impulse GI: soft, non-tender; bowel sounds normal; no masses,  no organomegaly Extremities: extremities normal, atraumatic, no cyanosis or edema Neurologic: Alert and oriented X 3, normal strength and tone. Normal symmetric reflexes. Normal coordination and gait  ECOG PERFORMANCE STATUS: 1 - Symptomatic but completely ambulatory  Blood pressure 130/72, pulse 73, temperature 97.9 F (36.6 C), temperature source Oral, resp. rate 20, height 5\' 2"  (1.575 m), weight 117 lb 4.8 oz (53.2 kg), SpO2 100 %.  LABORATORY DATA: Lab Results  Component Value Date   WBC 4.5 08/19/2018   HGB 12.4 08/19/2018   HCT 38.3 08/19/2018   MCV 96.2 08/19/2018   PLT 137 (L) 08/19/2018      Chemistry      Component Value Date/Time   NA 139 07/21/2018 1011   NA 142 05/28/2017 1015   K 3.9 07/21/2018 1011   K 4.2 05/28/2017 1015   CL 107 07/21/2018 1011   CL 108 (H) 11/04/2012 0947   CO2 24 07/21/2018 1011   CO2 23 05/28/2017 1015   BUN 15 07/21/2018 1011   BUN 14.2 05/28/2017 1015   CREATININE 0.82 07/21/2018 1011   CREATININE 0.9 05/28/2017 1015      Component Value Date/Time   CALCIUM 8.7 (L) 07/21/2018 1011   CALCIUM 9.0 05/28/2017 1015   ALKPHOS 121 07/21/2018 1011   ALKPHOS 84 05/28/2017 1015   AST 16 07/21/2018 1011   AST 19 05/28/2017 1015   ALT 6 07/21/2018 1011   ALT 7 05/28/2017 1015   BILITOT 0.5 07/21/2018 1011   BILITOT 0.54 05/28/2017 1015       RADIOGRAPHIC STUDIES: Ct Chest Wo Contrast  Result Date: 08/18/2018 CLINICAL DATA:  Follow-up lung cancer EXAM: CT CHEST WITHOUT CONTRAST TECHNIQUE: Multidetector CT imaging of the chest was  performed following the standard protocol without IV contrast. COMPARISON:  05/22/2018 02/24/2018 FINDINGS: Cardiovascular: Right internal jugular port catheter. Coronary artery calcifications and stents. Normal heart size. No pericardial effusion. Mediastinum/Nodes: No enlarged mediastinal, hilar, or axillary lymph nodes. Thyroid gland, trachea, and esophagus demonstrate no significant findings. Lungs/Pleura: Status post left lower lobectomy. No significant interval change in a partially calcified suprahilar mass of the right lung with total obstruction of the right upper lobe bronchi and dense consolidation of the right upper lobe about the mass. There are multiple bilateral spiculated pulmonary nodules and additional dense masslike consolidation of the left lung base, consistent with multifocal metastatic disease. Although difficult to measure, fullness of left base mass and adjacent consolidation is decreased compared to prior examination (e.g. series 2, image 13 versus prior examination series 5, image 110). No change in multiple bilateral spiculated pulmonary nodules, for example an index nodule measuring 1.7 cm (series 2, image 66). No pleural effusion or pneumothorax. Upper Abdomen: No acute abnormality. Musculoskeletal: No chest wall mass or suspicious bone lesions identified. IMPRESSION: 1. Status post left lower lobectomy. No significant interval change in a partially calcified suprahilar mass of the right lung with total obstruction of the right upper lobe bronchi and dense consolidation of the right upper lobe about the mass. There are multiple  bilateral spiculated pulmonary nodules and additional dense masslike consolidation of the left lung base, consistent with multifocal metastatic disease. Although difficult to measure, fullness of left base mass and adjacent consolidation is decreased compared to prior examination (e.g. series 2, image 13 versus prior examination series 5, image 110). No change in  multiple bilateral spiculated pulmonary nodules, for example an index nodule measuring 1.7 cm (series 2, image 66). Findings are generally consistent with stable multifocal disease. There are no new masses or nodules identified. 2.  Coronary artery disease. Electronically Signed   By: Eddie Candle M.D.   On: 08/18/2018 08:32    ASSESSMENT AND PLAN:  This is a very pleasant 85 years old white female with recurrent non-small cell lung cancer, adenocarcinoma status post induction systemic chemotherapy with carboplatin and Alimta with partial response. She is currently on maintenance treatment with single agent Alimta status post 113 cycles. The patient has been tolerating this treatment well with no concerning complaints. Unfortunately restaging scan after cycle 113 showed evidence for disease progression. The patient was started on second line treatment with immunotherapy with Nivolumab 480 mg IV every 4 weeks status post 12 cycles. The patient continues to tolerate this treatment well with no concerning adverse effect except for the dry cough from her previous palliative radiotherapy. She had repeat CT scan of the chest performed recently.  I personally and independently reviewed the scans and discussed the results with the patient today. Her scan showed no concerning findings for disease progression. I recommended for the patient to continue her current treatment with nivolumab with the same dose. I will see her back for follow-up visit in 4 weeks for evaluation before the next cycle of her treatment. For the dry cough she will continue with the current cough medication for now. The patient was advised to call immediately if she has any concerning symptoms in the interval. The patient voices understanding of current disease status and treatment options and is in agreement with the current care plan. All questions were answered. The patient knows to call the clinic with any problems, questions or  concerns. We can certainly see the patient much sooner if necessary.  Disclaimer: This note was dictated with voice recognition software. Similar sounding words can inadvertently be transcribed and may not be corrected upon review.

## 2018-08-20 ENCOUNTER — Telehealth: Payer: Self-pay | Admitting: Internal Medicine

## 2018-08-20 NOTE — Telephone Encounter (Signed)
Additional appts added per 3/18 los - pt to get an updated schedule next visit.

## 2018-09-15 ENCOUNTER — Other Ambulatory Visit: Payer: Self-pay | Admitting: Medical Oncology

## 2018-09-15 DIAGNOSIS — C3491 Malignant neoplasm of unspecified part of right bronchus or lung: Secondary | ICD-10-CM

## 2018-09-15 DIAGNOSIS — C3492 Malignant neoplasm of unspecified part of left bronchus or lung: Principal | ICD-10-CM

## 2018-09-16 ENCOUNTER — Inpatient Hospital Stay: Payer: Medicare Other | Attending: Internal Medicine | Admitting: Internal Medicine

## 2018-09-16 ENCOUNTER — Inpatient Hospital Stay: Payer: Medicare Other

## 2018-09-16 ENCOUNTER — Other Ambulatory Visit: Payer: Self-pay

## 2018-09-16 ENCOUNTER — Encounter: Payer: Self-pay | Admitting: Internal Medicine

## 2018-09-16 VITALS — BP 128/77 | HR 77 | Temp 97.8°F | Resp 18 | Ht 62.0 in | Wt 116.8 lb

## 2018-09-16 DIAGNOSIS — Z5112 Encounter for antineoplastic immunotherapy: Secondary | ICD-10-CM | POA: Diagnosis not present

## 2018-09-16 DIAGNOSIS — C3492 Malignant neoplasm of unspecified part of left bronchus or lung: Principal | ICD-10-CM

## 2018-09-16 DIAGNOSIS — Z95828 Presence of other vascular implants and grafts: Secondary | ICD-10-CM

## 2018-09-16 DIAGNOSIS — C3411 Malignant neoplasm of upper lobe, right bronchus or lung: Secondary | ICD-10-CM

## 2018-09-16 DIAGNOSIS — C3491 Malignant neoplasm of unspecified part of right bronchus or lung: Secondary | ICD-10-CM

## 2018-09-16 LAB — CMP (CANCER CENTER ONLY)
ALT: 8 U/L (ref 0–44)
AST: 21 U/L (ref 15–41)
Albumin: 3.1 g/dL — ABNORMAL LOW (ref 3.5–5.0)
Alkaline Phosphatase: 109 U/L (ref 38–126)
Anion gap: 9 (ref 5–15)
BUN: 18 mg/dL (ref 8–23)
CO2: 24 mmol/L (ref 22–32)
Calcium: 8.7 mg/dL — ABNORMAL LOW (ref 8.9–10.3)
Chloride: 106 mmol/L (ref 98–111)
Creatinine: 0.78 mg/dL (ref 0.44–1.00)
GFR, Est AFR Am: >60 mL/min (ref >60)
GFR, Estimated: >60 mL/min (ref >60)
Glucose, Bld: 104 mg/dL — ABNORMAL HIGH (ref 70–99)
Potassium: 4.3 mmol/L (ref 3.5–5.1)
Sodium: 139 mmol/L (ref 135–145)
Total Bilirubin: 0.3 mg/dL (ref 0.3–1.2)
Total Protein: 7.5 g/dL (ref 6.5–8.1)

## 2018-09-16 LAB — CBC WITH DIFFERENTIAL (CANCER CENTER ONLY)
Abs Immature Granulocytes: 0.01 10*3/uL (ref 0.00–0.07)
Basophils Absolute: 0 10*3/uL (ref 0.0–0.1)
Basophils Relative: 1 %
Eosinophils Absolute: 0.1 10*3/uL (ref 0.0–0.5)
Eosinophils Relative: 2 %
HCT: 38.8 % (ref 36.0–46.0)
Hemoglobin: 12.4 g/dL (ref 12.0–15.0)
Immature Granulocytes: 0 %
Lymphocytes Relative: 29 %
Lymphs Abs: 1.7 10*3/uL (ref 0.7–4.0)
MCH: 31.1 pg (ref 26.0–34.0)
MCHC: 32 g/dL (ref 30.0–36.0)
MCV: 97.2 fL (ref 80.0–100.0)
Monocytes Absolute: 0.5 10*3/uL (ref 0.1–1.0)
Monocytes Relative: 9 %
Neutro Abs: 3.5 10*3/uL (ref 1.7–7.7)
Neutrophils Relative %: 59 %
Platelet Count: 274 10*3/uL (ref 150–400)
RBC: 3.99 MIL/uL (ref 3.87–5.11)
RDW: 14.1 % (ref 11.5–15.5)
WBC Count: 5.9 10*3/uL (ref 4.0–10.5)
nRBC: 0 % (ref 0.0–0.2)

## 2018-09-16 MED ORDER — SODIUM CHLORIDE 0.9 % IV SOLN
Freq: Once | INTRAVENOUS | Status: AC
  Administered 2018-09-16: 13:00:00 via INTRAVENOUS
  Filled 2018-09-16: qty 250

## 2018-09-16 MED ORDER — HEPARIN SOD (PORK) LOCK FLUSH 100 UNIT/ML IV SOLN
500.0000 [IU] | Freq: Once | INTRAVENOUS | Status: AC | PRN
  Administered 2018-09-16: 500 [IU]
  Filled 2018-09-16: qty 5

## 2018-09-16 MED ORDER — SODIUM CHLORIDE 0.9 % IV SOLN
480.0000 mg | Freq: Once | INTRAVENOUS | Status: AC
  Administered 2018-09-16: 480 mg via INTRAVENOUS
  Filled 2018-09-16: qty 48

## 2018-09-16 MED ORDER — SODIUM CHLORIDE 0.9% FLUSH
10.0000 mL | INTRAVENOUS | Status: DC | PRN
  Administered 2018-09-16: 12:00:00 10 mL
  Filled 2018-09-16: qty 10

## 2018-09-16 MED ORDER — SODIUM CHLORIDE 0.9% FLUSH
10.0000 mL | INTRAVENOUS | Status: DC | PRN
  Administered 2018-09-16: 10 mL
  Filled 2018-09-16: qty 10

## 2018-09-16 NOTE — Patient Instructions (Signed)
[facility] Discharge Instructions for Patients Receiving Chemotherapy  Today you received the following chemotherapy agents: Nivolumab  To help prevent nausea and vomiting after your treatment, we encourage you to take your nausea medication as directed.    If you develop nausea and vomiting that is not controlled by your nausea medication, call the clinic.   BELOW ARE SYMPTOMS THAT SHOULD BE REPORTED IMMEDIATELY:  *FEVER GREATER THAN 100.5 F  *CHILLS WITH OR WITHOUT FEVER  NAUSEA AND VOMITING THAT IS NOT CONTROLLED WITH YOUR NAUSEA MEDICATION  *UNUSUAL SHORTNESS OF BREATH  *UNUSUAL BRUISING OR BLEEDING  TENDERNESS IN MOUTH AND THROAT WITH OR WITHOUT PRESENCE OF ULCERS  *URINARY PROBLEMS  *BOWEL PROBLEMS  UNUSUAL RASH Items with * indicate a potential emergency and should be followed up as soon as possible.  Feel free to call the clinic should you have any questions or concerns. The clinic phone number is [phone].  Please show the CHEMO ALERT CARD at check-in to the Emergency Department and triage nurse.   

## 2018-09-16 NOTE — Progress Notes (Signed)
[place] [facility] [phone]   [phone]  OFFICE PROGRESS NOTE  [name], MD [place] [address]  DIAGNOSIS: Recurrent non-small cell lung cancer, adenocarcinoma initially diagnosed as stage IB (T2, N0, M0) in June 2008 with tumor size of 8.0 cm.  PRIOR THERAPY: #1 status post left upper lobectomy with lymph node dissection under the care of Dr. Arlyce Dice on 06/29/2006.  #2 status post 4 cycles of adjuvant chemotherapy with cisplatin and Taxotere last dose given 08/01/2006.  #3 status post 6 cycles of systemic chemotherapy with carboplatin and Alimta for disease recurrence last dose given 12/12/2009.  #4 palliative radiotherapy to the enlarging right upper lobe lung mass under the care of Dr. Pablo Ledger completed on 11/14/2015. #5 Maintenance systemic chemotherapy with single agent Alimta 400 MG/M2 every 3 weeks, status post 113 cycles.  CURRENT THERAPY: Second line treatment with immunotherapy with Nivolumab 480 mg IV every 4 weeks.  First dose 09/17/2017.  Status post 13 cycles.  INTERVAL HISTORY: [name] 85 y.o. female returns to the clinic today for follow-up visit.  The patient is feeling fine today with no concerning complaints except for the dry cough and mild arthralgia after the treatment with immunotherapy.  She took ibuprofen and felt much better.  She denied having any chest pain but has shortness of breath with exertion with no hemoptysis.  She denied having any fever or chills.  She has no nausea, vomiting, diarrhea or constipation.  She is here today for evaluation before starting cycle 15.  MEDICAL HISTORY: Past Medical History:  Diagnosis Date   FHx: chemotherapy 2008&2011   4 cycles cisplatin,taxotere,2008& carboplatin and Alimta 2011   History of migraine headaches    Hypercholesterolemia    lung ca dx'd 06/2006   rt and lt lung   Lung cancer ([place]) 3/14/1   right- Adenocarcinoma  w/bronchioalveolar features   Radiation 11/01/15-11/14/15   right upper lobe 30 gray   Radiation pneumonitis ([place]) 01/24/2016    ALLERGIES:  is allergic to simvastatin.  MEDICATIONS:  Current Outpatient Medications  Medication Sig Dispense Refill   albuterol (PROVENTIL HFA;VENTOLIN HFA) 108 (90 Base) MCG/ACT inhaler Inhale 1-2 puffs into the lungs every 6 (six) hours as needed for wheezing or shortness of breath. (Patient not taking: Reported on 04/28/2018) 1 Inhaler 2   chlorpheniramine-HYDROcodone (TUSSIONEX) 10-8 MG/5ML SUER Take 5 mLs by mouth 2 (two) times daily as needed for cough. 140 mL 0   Multiple Vitamins-Minerals (CENTRUM SILVER PO) Take 1 tablet by mouth daily. Reported on 12/13/2015     prochlorperazine (COMPAZINE) 10 MG tablet Take 1 tablet (10 mg total) by mouth every 6 (six) hours as needed. (Patient not taking: Reported on 01/07/2018) 15 tablet 1   vitamin C (ASCORBIC ACID) 500 MG tablet Take 500 mg by mouth daily.     No current facility-administered medications for this visit.     SURGICAL HISTORY:  Past Surgical History:  Procedure Laterality Date   CATARACT EXTRACTION  2013   bilateral   left  lowerlung lobectomy Left 06/19/2006   Dr.Burney,Left lower lobectomy   Porta-cath  2011    REVIEW OF SYSTEMS:  A comprehensive review of systems was negative except for: Constitutional: positive for fatigue Respiratory: positive for cough Musculoskeletal: positive for arthralgias   PHYSICAL EXAMINATION: General appearance: alert, cooperative, fatigued and no distress Head: Normocephalic, without obvious abnormality, atraumatic Neck: no adenopathy, no JVD, supple, symmetrical, trachea midline and thyroid not enlarged,  symmetric, no tenderness/mass/nodules Lymph nodes: Cervical, supraclavicular, and axillary nodes normal. Resp: rales bilaterally Back: symmetric, no curvature. ROM normal. No CVA tenderness. Cardio: regular rate and rhythm, S1, S2 normal, no murmur,  click, rub or gallop and normal apical impulse GI: soft, non-tender; bowel sounds normal; no masses,  no organomegaly Extremities: extremities normal, atraumatic, no cyanosis or edema  ECOG PERFORMANCE STATUS: 1 - Symptomatic but completely ambulatory  Blood pressure 128/77, pulse 77, temperature 97.8 F (36.6 C), temperature source Oral, resp. rate 18, height 5\' 2"  (1.575 m), weight 116 lb 12.8 oz (53 kg), SpO2 100 %.  LABORATORY DATA: Lab Results  Component Value Date   WBC 4.5 08/19/2018   HGB 12.4 08/19/2018   HCT 38.3 08/19/2018   MCV 96.2 08/19/2018   PLT 137 (L) 08/19/2018      Chemistry      Component Value Date/Time   NA 139 08/19/2018 1144   NA 142 05/28/2017 1015   K 4.1 08/19/2018 1144   K 4.2 05/28/2017 1015   CL 107 08/19/2018 1144   CL 108 (H) 11/04/2012 0947   CO2 23 08/19/2018 1144   CO2 23 05/28/2017 1015   BUN 18 08/19/2018 1144   BUN 14.2 05/28/2017 1015   CREATININE 0.84 08/19/2018 1144   CREATININE 0.9 05/28/2017 1015      Component Value Date/Time   CALCIUM 8.7 (L) 08/19/2018 1144   CALCIUM 9.0 05/28/2017 1015   ALKPHOS 116 08/19/2018 1144   ALKPHOS 84 05/28/2017 1015   AST 20 08/19/2018 1144   AST 19 05/28/2017 1015   ALT 9 08/19/2018 1144   ALT 7 05/28/2017 1015   BILITOT 0.5 08/19/2018 1144   BILITOT 0.54 05/28/2017 1015       RADIOGRAPHIC STUDIES: Ct Chest Wo Contrast  Result Date: 08/18/2018 CLINICAL DATA:  Follow-up lung cancer EXAM: CT CHEST WITHOUT CONTRAST TECHNIQUE: Multidetector CT imaging of the chest was performed following the standard protocol without IV contrast. COMPARISON:  05/22/2018 02/24/2018 FINDINGS: Cardiovascular: Right internal jugular port catheter. Coronary artery calcifications and stents. Normal heart size. No pericardial effusion. Mediastinum/Nodes: No enlarged mediastinal, hilar, or axillary lymph nodes. Thyroid gland, trachea, and esophagus demonstrate no significant findings. Lungs/Pleura: Status post left  lower lobectomy. No significant interval change in a partially calcified suprahilar mass of the right lung with total obstruction of the right upper lobe bronchi and dense consolidation of the right upper lobe about the mass. There are multiple bilateral spiculated pulmonary nodules and additional dense masslike consolidation of the left lung base, consistent with multifocal metastatic disease. Although difficult to measure, fullness of left base mass and adjacent consolidation is decreased compared to prior examination (e.g. series 2, image 13 versus prior examination series 5, image 110). No change in multiple bilateral spiculated pulmonary nodules, for example an index nodule measuring 1.7 cm (series 2, image 66). No pleural effusion or pneumothorax. Upper Abdomen: No acute abnormality. Musculoskeletal: No chest wall mass or suspicious bone lesions identified. IMPRESSION: 1. Status post left lower lobectomy. No significant interval change in a partially calcified suprahilar mass of the right lung with total obstruction of the right upper lobe bronchi and dense consolidation of the right upper lobe about the mass. There are multiple bilateral spiculated pulmonary nodules and additional dense masslike consolidation of the left lung base, consistent with multifocal metastatic disease. Although difficult to measure, fullness of left base mass and adjacent consolidation is decreased compared to prior examination (e.g. series 2, image 13 versus prior examination series  5, image 110). No change in multiple bilateral spiculated pulmonary nodules, for example an index nodule measuring 1.7 cm (series 2, image 66). Findings are generally consistent with stable multifocal disease. There are no new masses or nodules identified. 2.  Coronary artery disease. Electronically Signed   By: Eddie Candle M.D.   On: 08/18/2018 08:32    ASSESSMENT AND PLAN:  This is a very pleasant 85 years old white female with recurrent non-small  cell lung cancer, adenocarcinoma status post induction systemic chemotherapy with carboplatin and Alimta with partial response. She is currently on maintenance treatment with single agent Alimta status post 113 cycles. The patient has been tolerating this treatment well with no concerning complaints. Unfortunately restaging scan after cycle 113 showed evidence for disease progression. The patient was started on second line treatment with immunotherapy with Nivolumab 480 mg IV every 4 weeks status post 13 cycles. The patient has been tolerating her treatment well with no concerning adverse effects except for the fatigue and arthralgia. I recommended for the patient continue her current treatment with nivolumab and she will proceed with cycle #14 today. I will see her back for follow-up visit in 4 weeks for evaluation before the next cycle of her treatment. She was advised to call immediately if she has any concerning symptoms in the interval. The patient voices understanding of current disease status and treatment options and is in agreement with the current care plan. All questions were answered. The patient knows to call the clinic with any problems, questions or concerns. We can certainly see the patient much sooner if necessary.  Disclaimer: This note was dictated with voice recognition software. Similar sounding words can inadvertently be transcribed and may not be corrected upon review.

## 2018-10-14 ENCOUNTER — Inpatient Hospital Stay: Payer: Medicare Other | Admitting: Internal Medicine

## 2018-10-14 ENCOUNTER — Inpatient Hospital Stay: Payer: Medicare Other

## 2018-10-14 ENCOUNTER — Other Ambulatory Visit: Payer: Self-pay

## 2018-10-14 ENCOUNTER — Encounter: Payer: Self-pay | Admitting: Internal Medicine

## 2018-10-14 ENCOUNTER — Inpatient Hospital Stay: Payer: Medicare Other | Attending: Internal Medicine

## 2018-10-14 VITALS — BP 126/81 | HR 77 | Temp 97.9°F | Resp 20 | Wt 118.0 lb

## 2018-10-14 DIAGNOSIS — C3411 Malignant neoplasm of upper lobe, right bronchus or lung: Secondary | ICD-10-CM

## 2018-10-14 DIAGNOSIS — Z5112 Encounter for antineoplastic immunotherapy: Secondary | ICD-10-CM | POA: Insufficient documentation

## 2018-10-14 DIAGNOSIS — Z95828 Presence of other vascular implants and grafts: Secondary | ICD-10-CM

## 2018-10-14 DIAGNOSIS — C3412 Malignant neoplasm of upper lobe, left bronchus or lung: Secondary | ICD-10-CM | POA: Diagnosis present

## 2018-10-14 DIAGNOSIS — Z9221 Personal history of antineoplastic chemotherapy: Secondary | ICD-10-CM | POA: Insufficient documentation

## 2018-10-14 DIAGNOSIS — C3491 Malignant neoplasm of unspecified part of right bronchus or lung: Secondary | ICD-10-CM

## 2018-10-14 DIAGNOSIS — Z902 Acquired absence of lung [part of]: Secondary | ICD-10-CM

## 2018-10-14 DIAGNOSIS — Z923 Personal history of irradiation: Secondary | ICD-10-CM

## 2018-10-14 LAB — CBC WITH DIFFERENTIAL (CANCER CENTER ONLY)
Abs Immature Granulocytes: 0.01 10*3/uL (ref 0.00–0.07)
Basophils Absolute: 0 10*3/uL (ref 0.0–0.1)
Basophils Relative: 1 %
Eosinophils Absolute: 0 10*3/uL (ref 0.0–0.5)
Eosinophils Relative: 1 %
HCT: 37.5 % (ref 36.0–46.0)
Hemoglobin: 12.2 g/dL (ref 12.0–15.0)
Immature Granulocytes: 0 %
Lymphocytes Relative: 33 %
Lymphs Abs: 1.4 10*3/uL (ref 0.7–4.0)
MCH: 30.8 pg (ref 26.0–34.0)
MCHC: 32.5 g/dL (ref 30.0–36.0)
MCV: 94.7 fL (ref 80.0–100.0)
Monocytes Absolute: 0.4 10*3/uL (ref 0.1–1.0)
Monocytes Relative: 9 %
Neutro Abs: 2.4 10*3/uL (ref 1.7–7.7)
Neutrophils Relative %: 56 %
Platelet Count: 245 10*3/uL (ref 150–400)
RBC: 3.96 MIL/uL (ref 3.87–5.11)
RDW: 14 % (ref 11.5–15.5)
WBC Count: 4.3 10*3/uL (ref 4.0–10.5)
nRBC: 0 % (ref 0.0–0.2)

## 2018-10-14 LAB — CMP (CANCER CENTER ONLY)
ALT: 9 U/L (ref 0–44)
AST: 22 U/L (ref 15–41)
Albumin: 3.1 g/dL — ABNORMAL LOW (ref 3.5–5.0)
Alkaline Phosphatase: 106 U/L (ref 38–126)
Anion gap: 5 (ref 5–15)
BUN: 16 mg/dL (ref 8–23)
CO2: 25 mmol/L (ref 22–32)
Calcium: 8.6 mg/dL — ABNORMAL LOW (ref 8.9–10.3)
Chloride: 106 mmol/L (ref 98–111)
Creatinine: 0.78 mg/dL (ref 0.44–1.00)
GFR, Est AFR Am: >60 mL/min (ref >60)
GFR, Estimated: >60 mL/min (ref >60)
Glucose, Bld: 92 mg/dL (ref 70–99)
Potassium: 4.1 mmol/L (ref 3.5–5.1)
Sodium: 136 mmol/L (ref 135–145)
Total Bilirubin: 0.4 mg/dL (ref 0.3–1.2)
Total Protein: 7.4 g/dL (ref 6.5–8.1)

## 2018-10-14 MED ORDER — HEPARIN SOD (PORK) LOCK FLUSH 100 UNIT/ML IV SOLN
500.0000 [IU] | Freq: Once | INTRAVENOUS | Status: AC | PRN
  Administered 2018-10-14: 500 [IU]
  Filled 2018-10-14: qty 5

## 2018-10-14 MED ORDER — SODIUM CHLORIDE 0.9% FLUSH
10.0000 mL | INTRAVENOUS | Status: DC | PRN
  Administered 2018-10-14: 10 mL
  Filled 2018-10-14: qty 10

## 2018-10-14 MED ORDER — SODIUM CHLORIDE 0.9 % IV SOLN
480.0000 mg | Freq: Once | INTRAVENOUS | Status: AC
  Administered 2018-10-14: 480 mg via INTRAVENOUS
  Filled 2018-10-14: qty 48

## 2018-10-14 MED ORDER — SODIUM CHLORIDE 0.9 % IV SOLN
Freq: Once | INTRAVENOUS | Status: AC
  Administered 2018-10-14: 10:00:00 via INTRAVENOUS
  Filled 2018-10-14: qty 250

## 2018-10-14 NOTE — Progress Notes (Signed)
[facility] [phone]   [phone]  OFFICE PROGRESS NOTE  [name], MD [place] [address]  DIAGNOSIS: Recurrent non-small cell lung cancer, adenocarcinoma initially diagnosed as stage IB (T2, N0, M0) in June 2008 with tumor size of 8.0 cm.  PRIOR THERAPY: #1 status post left upper lobectomy with lymph node dissection under the care of Dr. Arlyce Dice on 06/29/2006.  #2 status post 4 cycles of adjuvant chemotherapy with cisplatin and Taxotere last dose given 08/01/2006.  #3 status post 6 cycles of systemic chemotherapy with carboplatin and Alimta for disease recurrence last dose given 12/12/2009.  #4 palliative radiotherapy to the enlarging right upper lobe lung mass under the care of Dr. Pablo Ledger completed on 11/14/2015. #5 Maintenance systemic chemotherapy with single agent Alimta 400 MG/M2 every 3 weeks, status post 113 cycles.  CURRENT THERAPY: Second line treatment with immunotherapy with Nivolumab 480 mg IV every 4 weeks.  First dose 09/17/2017.  Status post 14 cycles.  INTERVAL HISTORY: [name] 85 y.o. female returns to the clinic today for follow-up visit.  The patient is feeling fine today with no concerning complaints except for the dry cough.  The patient denied having any chest pain, shortness of breath or hemoptysis.  She denied having any recent weight loss or night sweats.  She has no nausea, vomiting, diarrhea or constipation.  She denied having any fever or chills.  She has been tolerating her treatment with nivolumab fairly well.  She is here today for evaluation before starting cycle #15.   MEDICAL HISTORY: Past Medical History:  Diagnosis Date  . FHx: chemotherapy 2008&2011   4 cycles cisplatin,taxotere,2008& carboplatin and Alimta 2011  . History of migraine headaches   . Hypercholesterolemia   . lung ca dx'd 06/2006   rt and lt lung  . Lung cancer ([place]) 3/14/1   right- Adenocarcinoma  w/bronchioalveolar features  . Radiation 11/01/15-11/14/15   right upper lobe 30 gray  . Radiation pneumonitis ([place]) 01/24/2016    ALLERGIES:  is allergic to simvastatin.  MEDICATIONS:  Current Outpatient Medications  Medication Sig Dispense Refill  . albuterol (PROVENTIL HFA;VENTOLIN HFA) 108 (90 Base) MCG/ACT inhaler Inhale 1-2 puffs into the lungs every 6 (six) hours as needed for wheezing or shortness of breath. (Patient not taking: Reported on 04/28/2018) 1 Inhaler 2  . chlorpheniramine-HYDROcodone (TUSSIONEX) 10-8 MG/5ML SUER Take 5 mLs by mouth 2 (two) times daily as needed for cough. 140 mL 0  . Multiple Vitamins-Minerals (CENTRUM SILVER PO) Take 1 tablet by mouth daily. Reported on 12/13/2015    . prochlorperazine (COMPAZINE) 10 MG tablet Take 1 tablet (10 mg total) by mouth every 6 (six) hours as needed. (Patient not taking: Reported on 01/07/2018) 15 tablet 1  . vitamin C (ASCORBIC ACID) 500 MG tablet Take 500 mg by mouth daily.     No current facility-administered medications for this visit.    Facility-Administered Medications Ordered in Other Visits  Medication Dose Route Frequency Provider Last Rate Last Dose  . sodium chloride flush (NS) 0.9 % injection 10 mL  10 mL Intracatheter PRN Curt Bears, MD   10 mL at 10/14/18 8119    SURGICAL HISTORY:  Past Surgical History:  Procedure Laterality Date  . CATARACT EXTRACTION  2013   bilateral  . left  lowerlung lobectomy Left 06/19/2006   Dr.Burney,Left lower lobectomy  . Porta-cath  2011    REVIEW OF SYSTEMS:  A comprehensive review of systems  was negative except for: Respiratory: positive for cough   PHYSICAL EXAMINATION: General appearance: alert, cooperative and no distress Head: Normocephalic, without obvious abnormality, atraumatic Neck: no adenopathy, no JVD, supple, symmetrical, trachea midline and thyroid not enlarged, symmetric, no tenderness/mass/nodules Lymph nodes: Cervical, supraclavicular, and axillary  nodes normal. Resp: rales bilaterally Back: symmetric, no curvature. ROM normal. No CVA tenderness. Cardio: regular rate and rhythm, S1, S2 normal, no murmur, click, rub or gallop and normal apical impulse GI: soft, non-tender; bowel sounds normal; no masses,  no organomegaly Extremities: extremities normal, atraumatic, no cyanosis or edema  ECOG PERFORMANCE STATUS: 1 - Symptomatic but completely ambulatory  Blood pressure 126/81, pulse 77, temperature 97.9 F (36.6 C), resp. rate 20, weight 118 lb (53.5 kg), SpO2 100 %.  LABORATORY DATA: Lab Results  Component Value Date   WBC 5.9 09/16/2018   HGB 12.4 09/16/2018   HCT 38.8 09/16/2018   MCV 97.2 09/16/2018   PLT 274 09/16/2018      Chemistry      Component Value Date/Time   NA 139 09/16/2018 1214   NA 142 05/28/2017 1015   K 4.3 09/16/2018 1214   K 4.2 05/28/2017 1015   CL 106 09/16/2018 1214   CL 108 (H) 11/04/2012 0947   CO2 24 09/16/2018 1214   CO2 23 05/28/2017 1015   BUN 18 09/16/2018 1214   BUN 14.2 05/28/2017 1015   CREATININE 0.78 09/16/2018 1214   CREATININE 0.9 05/28/2017 1015      Component Value Date/Time   CALCIUM 8.7 (L) 09/16/2018 1214   CALCIUM 9.0 05/28/2017 1015   ALKPHOS 109 09/16/2018 1214   ALKPHOS 84 05/28/2017 1015   AST 21 09/16/2018 1214   AST 19 05/28/2017 1015   ALT 8 09/16/2018 1214   ALT 7 05/28/2017 1015   BILITOT 0.3 09/16/2018 1214   BILITOT 0.54 05/28/2017 1015       RADIOGRAPHIC STUDIES: No results found.  ASSESSMENT AND PLAN:  This is a very pleasant 85 years old white female with recurrent non-small cell lung cancer, adenocarcinoma status post induction systemic chemotherapy with carboplatin and Alimta with partial response. She is currently on maintenance treatment with single agent Alimta status post 113 cycles. The patient has been tolerating this treatment well with no concerning complaints. Unfortunately restaging scan after cycle 113 showed evidence for disease  progression. The patient was started on second line treatment with immunotherapy with Nivolumab 480 mg IV every 4 weeks status post 14 cycles. The patient continues to tolerate this treatment well with no concerning complaints. I recommended for her to proceed with cycle #15 today as planned. I will see him back for follow-up visit in 4 weeks for evaluation before the next cycle of her treatment. She was advised to call immediately if she has any concerning symptoms in the interval. The patient voices understanding of current disease status and treatment options and is in agreement with the current care plan. All questions were answered. The patient knows to call the clinic with any problems, questions or concerns. We can certainly see the patient much sooner if necessary.  Disclaimer: This note was dictated with voice recognition software. Similar sounding words can inadvertently be transcribed and may not be corrected upon review.

## 2018-10-14 NOTE — Patient Instructions (Signed)
[facility] Discharge Instructions for Patients Receiving Chemotherapy  Today you received the following chemotherapy agents Nivolumab (OPDIVO).  To help prevent nausea and vomiting after your treatment, we encourage you to take your nausea medication as prescribed.   If you develop nausea and vomiting that is not controlled by your nausea medication, call the clinic.   BELOW ARE SYMPTOMS THAT SHOULD BE REPORTED IMMEDIATELY:  *FEVER GREATER THAN 100.5 F  *CHILLS WITH OR WITHOUT FEVER  NAUSEA AND VOMITING THAT IS NOT CONTROLLED WITH YOUR NAUSEA MEDICATION  *UNUSUAL SHORTNESS OF BREATH  *UNUSUAL BRUISING OR BLEEDING  TENDERNESS IN MOUTH AND THROAT WITH OR WITHOUT PRESENCE OF ULCERS  *URINARY PROBLEMS  *BOWEL PROBLEMS  UNUSUAL RASH Items with * indicate a potential emergency and should be followed up as soon as possible.  Feel free to call the clinic should you have any questions or concerns. The clinic phone number is [phone].  Please show the [facility] at check-in to the Emergency Department and triage nurse.  Coronavirus (COVID-19) Are you at risk?  Are you at risk for the Coronavirus (COVID-19)?  To be considered HIGH RISK for Coronavirus (COVID-19), you have to meet the following criteria:  . Traveled to [place], [place], [place], [place] or [place]; or in the [place] to [place], [place], [place], or [place]; and have fever, cough, and shortness of breath within the last 2 weeks of travel OR . Been in close contact with a person diagnosed with COVID-19 within the last 2 weeks and have fever, cough, and shortness of breath . IF YOU DO NOT MEET THESE CRITERIA, YOU ARE CONSIDERED LOW RISK FOR COVID-19.  What to do if you are HIGH RISK for COVID-19?  Marland Kitchen If you are having a medical emergency, call 911. . Seek medical care right away. Before you go to a doctor's office, urgent care or emergency department, call ahead and tell them  about your recent travel, contact with someone diagnosed with COVID-19, and your symptoms. You should receive instructions from your physician's office regarding next steps of care.  . When you arrive at healthcare provider, tell the healthcare staff immediately you have returned from visiting [place], [place], [place], [place] or [place]; or traveled in the [place] to [place], [place], [place], or [place]; in the last two weeks or you have been in close contact with a person diagnosed with COVID-19 in the last 2 weeks.   . Tell the health care staff about your symptoms: fever, cough and shortness of breath. . After you have been seen by a medical provider, you will be either: o Tested for (COVID-19) and discharged home on quarantine except to seek medical care if symptoms worsen, and asked to  - Stay home and avoid contact with others until you get your results (4-5 days)  - Avoid travel on public transportation if possible (such as bus, train, or airplane) or o Sent to the Emergency Department by EMS for evaluation, COVID-19 testing, and possible admission depending on your condition and test results.  What to do if you are LOW RISK for COVID-19?  Reduce your risk of any infection by using the same precautions used for avoiding the common cold or flu:  Marland Kitchen Wash your hands often with soap and warm water for at least 20 seconds.  If soap and water are not readily available, use an alcohol-based hand sanitizer with at least 60% alcohol.  . If coughing or  sneezing, cover your mouth and nose by coughing or sneezing into the elbow areas of your shirt or coat, into a tissue or into your sleeve (not your hands). . Avoid shaking hands with others and consider head nods or verbal greetings only. . Avoid touching your eyes, nose, or mouth with unwashed hands.  . Avoid close contact with people who are sick. . Avoid places or events with large numbers of people in one location, like concerts or  sporting events. . Carefully consider travel plans you have or are making. . If you are planning any travel outside or inside the [place], visit the CDC's Travelers' Health webpage for the latest health notices. . If you have some symptoms but not all symptoms, continue to monitor at home and seek medical attention if your symptoms worsen. . If you are having a medical emergency, call 911.   [place] / e-Visit: [email or web address]         [facility]: [facility]: [phone]                   [facility]: [phone] \

## 2018-10-15 ENCOUNTER — Telehealth: Payer: Self-pay | Admitting: Internal Medicine

## 2018-10-15 NOTE — Telephone Encounter (Signed)
Scheduled appt per 5/13 los - added additional cycles - pt to get an updated schedule next visit.

## 2018-11-11 ENCOUNTER — Encounter: Payer: Self-pay | Admitting: Internal Medicine

## 2018-11-11 ENCOUNTER — Inpatient Hospital Stay: Payer: Medicare Other

## 2018-11-11 ENCOUNTER — Inpatient Hospital Stay: Payer: Medicare Other | Admitting: Internal Medicine

## 2018-11-11 ENCOUNTER — Inpatient Hospital Stay: Payer: Medicare Other | Attending: Internal Medicine

## 2018-11-11 ENCOUNTER — Other Ambulatory Visit: Payer: Self-pay

## 2018-11-11 VITALS — BP 135/72 | HR 76 | Temp 98.2°F | Resp 16 | Ht 62.0 in | Wt 115.3 lb

## 2018-11-11 DIAGNOSIS — C3412 Malignant neoplasm of upper lobe, left bronchus or lung: Secondary | ICD-10-CM | POA: Insufficient documentation

## 2018-11-11 DIAGNOSIS — C3491 Malignant neoplasm of unspecified part of right bronchus or lung: Secondary | ICD-10-CM

## 2018-11-11 DIAGNOSIS — Z5112 Encounter for antineoplastic immunotherapy: Secondary | ICD-10-CM

## 2018-11-11 DIAGNOSIS — R05 Cough: Secondary | ICD-10-CM

## 2018-11-11 DIAGNOSIS — C3411 Malignant neoplasm of upper lobe, right bronchus or lung: Secondary | ICD-10-CM | POA: Insufficient documentation

## 2018-11-11 DIAGNOSIS — Z95828 Presence of other vascular implants and grafts: Secondary | ICD-10-CM

## 2018-11-11 DIAGNOSIS — Z79899 Other long term (current) drug therapy: Secondary | ICD-10-CM | POA: Insufficient documentation

## 2018-11-11 DIAGNOSIS — C3492 Malignant neoplasm of unspecified part of left bronchus or lung: Secondary | ICD-10-CM

## 2018-11-11 LAB — CBC WITH DIFFERENTIAL (CANCER CENTER ONLY)
Abs Immature Granulocytes: 0.01 10*3/uL (ref 0.00–0.07)
Basophils Absolute: 0 10*3/uL (ref 0.0–0.1)
Basophils Relative: 0 %
Eosinophils Absolute: 0 10*3/uL (ref 0.0–0.5)
Eosinophils Relative: 1 %
HCT: 38.8 % (ref 36.0–46.0)
Hemoglobin: 12.5 g/dL (ref 12.0–15.0)
Immature Granulocytes: 0 %
Lymphocytes Relative: 35 %
Lymphs Abs: 1.7 10*3/uL (ref 0.7–4.0)
MCH: 31.2 pg (ref 26.0–34.0)
MCHC: 32.2 g/dL (ref 30.0–36.0)
MCV: 96.8 fL (ref 80.0–100.0)
Monocytes Absolute: 0.4 10*3/uL (ref 0.1–1.0)
Monocytes Relative: 7 %
Neutro Abs: 2.7 10*3/uL (ref 1.7–7.7)
Neutrophils Relative %: 57 %
Platelet Count: 257 10*3/uL (ref 150–400)
RBC: 4.01 MIL/uL (ref 3.87–5.11)
RDW: 13.9 % (ref 11.5–15.5)
WBC Count: 4.8 10*3/uL (ref 4.0–10.5)
nRBC: 0 % (ref 0.0–0.2)

## 2018-11-11 LAB — CMP (CANCER CENTER ONLY)
ALT: 10 U/L (ref 0–44)
AST: 24 U/L (ref 15–41)
Albumin: 3.3 g/dL — ABNORMAL LOW (ref 3.5–5.0)
Alkaline Phosphatase: 106 U/L (ref 38–126)
Anion gap: 9 (ref 5–15)
BUN: 17 mg/dL (ref 8–23)
CO2: 23 mmol/L (ref 22–32)
Calcium: 8.7 mg/dL — ABNORMAL LOW (ref 8.9–10.3)
Chloride: 108 mmol/L (ref 98–111)
Creatinine: 0.83 mg/dL (ref 0.44–1.00)
GFR, Est AFR Am: >60 mL/min (ref >60)
GFR, Estimated: >60 mL/min (ref >60)
Glucose, Bld: 119 mg/dL — ABNORMAL HIGH (ref 70–99)
Potassium: 4.1 mmol/L (ref 3.5–5.1)
Sodium: 140 mmol/L (ref 135–145)
Total Bilirubin: 0.6 mg/dL (ref 0.3–1.2)
Total Protein: 7.6 g/dL (ref 6.5–8.1)

## 2018-11-11 LAB — TSH: TSH: 3.472 u[IU]/mL (ref 0.308–3.960)

## 2018-11-11 MED ORDER — SODIUM CHLORIDE 0.9 % IV SOLN
Freq: Once | INTRAVENOUS | Status: AC
  Administered 2018-11-11: 12:00:00 via INTRAVENOUS
  Filled 2018-11-11: qty 250

## 2018-11-11 MED ORDER — SODIUM CHLORIDE 0.9% FLUSH
10.0000 mL | INTRAVENOUS | Status: DC | PRN
  Administered 2018-11-11: 10 mL
  Filled 2018-11-11: qty 10

## 2018-11-11 MED ORDER — HEPARIN SOD (PORK) LOCK FLUSH 100 UNIT/ML IV SOLN
500.0000 [IU] | Freq: Once | INTRAVENOUS | Status: AC | PRN
  Administered 2018-11-11: 500 [IU]
  Filled 2018-11-11: qty 5

## 2018-11-11 MED ORDER — SODIUM CHLORIDE 0.9 % IV SOLN
480.0000 mg | Freq: Once | INTRAVENOUS | Status: AC
  Administered 2018-11-11: 13:00:00 480 mg via INTRAVENOUS
  Filled 2018-11-11: qty 48

## 2018-11-11 NOTE — Progress Notes (Signed)
[name] Telephone:(336) [phone]   Fax:(336) [phone]  OFFICE PROGRESS NOTE  [name], MD [place] [address]  DIAGNOSIS: Recurrent non-small cell lung cancer, adenocarcinoma initially diagnosed as stage IB (T2, N0, M0) in June 2008 with tumor size of 8.0 cm.  PRIOR THERAPY: #1 status post left upper lobectomy with lymph node dissection under the care of Dr. Arlyce Dice on 06/29/2006.  #2 status post 4 cycles of adjuvant chemotherapy with cisplatin and Taxotere last dose given 08/01/2006.  #3 status post 6 cycles of systemic chemotherapy with carboplatin and Alimta for disease recurrence last dose given 12/12/2009.  #4 palliative radiotherapy to the enlarging right upper lobe lung mass under the care of Dr. Pablo Ledger completed on 11/14/2015. #5 Maintenance systemic chemotherapy with single agent Alimta 400 MG/M2 every 3 weeks, status post 113 cycles.  CURRENT THERAPY: Second line treatment with immunotherapy with Nivolumab 480 mg IV every 4 weeks.  First dose 09/17/2017.  Status post 15 cycles.  INTERVAL HISTORY: [name] 85 y.o. female returns to the clinic today for follow-up visit.  The patient is feeling fine today with no concerning complaints except for the slight cough and she is currently on Tussionex.  She denied having any chest pain, shortness of breath, cough or hemoptysis.  She denied having any fever or chills.  She has no nausea, vomiting, diarrhea or constipation.  She continues to tolerate her treatment with nivolumab fairly well.  She is here for evaluation before starting cycle #16.  MEDICAL HISTORY: Past Medical History:  Diagnosis Date  . FHx: chemotherapy 2008&2011   4 cycles cisplatin,taxotere,2008& carboplatin and Alimta 2011  . History of migraine headaches   . Hypercholesterolemia   . lung ca dx'd 06/2006   rt and lt lung  . Lung cancer ([place]) 3/14/1   right- Adenocarcinoma w/bronchioalveolar features  .  Radiation 11/01/15-11/14/15   right upper lobe 30 gray  . Radiation pneumonitis ([place]) 01/24/2016    ALLERGIES:  is allergic to simvastatin.  MEDICATIONS:  Current Outpatient Medications  Medication Sig Dispense Refill  . chlorpheniramine-HYDROcodone (TUSSIONEX) 10-8 MG/5ML SUER Take 5 mLs by mouth 2 (two) times daily as needed for cough. 140 mL 0  . Multiple Vitamins-Minerals (CENTRUM SILVER PO) Take 1 tablet by mouth daily. Reported on 12/13/2015    . vitamin C (ASCORBIC ACID) 500 MG tablet Take 500 mg by mouth daily.    Marland Kitchen albuterol (PROVENTIL HFA;VENTOLIN HFA) 108 (90 Base) MCG/ACT inhaler Inhale 1-2 puffs into the lungs every 6 (six) hours as needed for wheezing or shortness of breath. (Patient not taking: Reported on 04/28/2018) 1 Inhaler 2  . prochlorperazine (COMPAZINE) 10 MG tablet Take 1 tablet (10 mg total) by mouth every 6 (six) hours as needed. (Patient not taking: Reported on 01/07/2018) 15 tablet 1   No current facility-administered medications for this visit.     SURGICAL HISTORY:  Past Surgical History:  Procedure Laterality Date  . CATARACT EXTRACTION  2013   bilateral  . left  lowerlung lobectomy Left 06/19/2006   Dr.Burney,Left lower lobectomy  . Porta-cath  2011    REVIEW OF SYSTEMS:  A comprehensive review of systems was negative except for: Respiratory: positive for cough   PHYSICAL EXAMINATION: General appearance: alert, cooperative and no distress Head: Normocephalic, without obvious abnormality, atraumatic Neck: no adenopathy, no JVD, supple, symmetrical, trachea midline and thyroid not enlarged, symmetric, no tenderness/mass/nodules Lymph nodes: Cervical, supraclavicular, and axillary nodes normal. Resp: rales  bilaterally Back: symmetric, no curvature. ROM normal. No CVA tenderness. Cardio: regular rate and rhythm, S1, S2 normal, no murmur, click, rub or gallop and normal apical impulse GI: soft, non-tender; bowel sounds normal; no masses,  no organomegaly  Extremities: extremities normal, atraumatic, no cyanosis or edema  ECOG PERFORMANCE STATUS: 1 - Symptomatic but completely ambulatory  Blood pressure 135/72, pulse 76, temperature 98.2 F (36.8 C), temperature source Oral, resp. rate 16, height 5\' 2"  (1.575 m), weight 115 lb 4.8 oz (52.3 kg), SpO2 100 %.  LABORATORY DATA: Lab Results  Component Value Date   WBC 4.8 11/11/2018   HGB 12.5 11/11/2018   HCT 38.8 11/11/2018   MCV 96.8 11/11/2018   PLT 257 11/11/2018      Chemistry      Component Value Date/Time   NA 136 10/14/2018 0902   NA 142 05/28/2017 1015   K 4.1 10/14/2018 0902   K 4.2 05/28/2017 1015   CL 106 10/14/2018 0902   CL 108 (H) 11/04/2012 0947   CO2 25 10/14/2018 0902   CO2 23 05/28/2017 1015   BUN 16 10/14/2018 0902   BUN 14.2 05/28/2017 1015   CREATININE 0.78 10/14/2018 0902   CREATININE 0.9 05/28/2017 1015      Component Value Date/Time   CALCIUM 8.6 (L) 10/14/2018 0902   CALCIUM 9.0 05/28/2017 1015   ALKPHOS 106 10/14/2018 0902   ALKPHOS 84 05/28/2017 1015   AST 22 10/14/2018 0902   AST 19 05/28/2017 1015   ALT 9 10/14/2018 0902   ALT 7 05/28/2017 1015   BILITOT 0.4 10/14/2018 0902   BILITOT 0.54 05/28/2017 1015       RADIOGRAPHIC STUDIES: No results found.  ASSESSMENT AND PLAN:  This is a very pleasant 85 years old white female with recurrent non-small cell lung cancer, adenocarcinoma status post induction systemic chemotherapy with carboplatin and Alimta with partial response. She is currently on maintenance treatment with single agent Alimta status post 113 cycles. The patient has been tolerating this treatment well with no concerning complaints. Unfortunately restaging scan after cycle 113 showed evidence for disease progression. The patient was started on second line treatment with immunotherapy with Nivolumab 480 mg IV every 4 weeks status post 15 cycles. The patient has been tolerating treatment with immunotherapy fairly well. I  recommended for her to proceed with cycle #16 today as planned. I will see the patient back for follow-up visit in 4 weeks for evaluation of the CT scan of the chest, abdomen and pelvis with restaging of her disease. For the dry cough, she will continue Tussionex. The patient was advised to call immediately if she has any concerning symptoms in the interval. The patient voices understanding of current disease status and treatment options and is in agreement with the current care plan. All questions were answered. The patient knows to call the clinic with any problems, questions or concerns. We can certainly see the patient much sooner if necessary.  Disclaimer: This note was dictated with voice recognition software. Similar sounding words can inadvertently be transcribed and may not be corrected upon review.

## 2018-11-11 NOTE — Patient Instructions (Signed)
[facility] Discharge Instructions for Patients Receiving Chemotherapy  Today you received the following chemotherapy agents Nivolumab (OPDIVO).  To help prevent nausea and vomiting after your treatment, we encourage you to take your nausea medication as prescribed.   If you develop nausea and vomiting that is not controlled by your nausea medication, call the clinic.   BELOW ARE SYMPTOMS THAT SHOULD BE REPORTED IMMEDIATELY:  *FEVER GREATER THAN 100.5 F  *CHILLS WITH OR WITHOUT FEVER  NAUSEA AND VOMITING THAT IS NOT CONTROLLED WITH YOUR NAUSEA MEDICATION  *UNUSUAL SHORTNESS OF BREATH  *UNUSUAL BRUISING OR BLEEDING  TENDERNESS IN MOUTH AND THROAT WITH OR WITHOUT PRESENCE OF ULCERS  *URINARY PROBLEMS  *BOWEL PROBLEMS  UNUSUAL RASH Items with * indicate a potential emergency and should be followed up as soon as possible.  Feel free to call the clinic should you have any questions or concerns. The clinic phone number is [phone].  Please show the CHEMO ALERT CARD at check-in to the Emergency Department and triage nurse.   

## 2018-11-12 ENCOUNTER — Telehealth: Payer: Self-pay | Admitting: Internal Medicine

## 2018-11-12 NOTE — Telephone Encounter (Signed)
Scheduled apt per 6/10 los - pt to get an updated schedule next visit.

## 2018-12-09 ENCOUNTER — Inpatient Hospital Stay: Payer: Medicare Other

## 2018-12-09 ENCOUNTER — Inpatient Hospital Stay: Payer: Medicare Other | Attending: Internal Medicine

## 2018-12-09 ENCOUNTER — Other Ambulatory Visit: Payer: Self-pay

## 2018-12-09 ENCOUNTER — Encounter: Payer: Self-pay | Admitting: Internal Medicine

## 2018-12-09 ENCOUNTER — Inpatient Hospital Stay (HOSPITAL_BASED_OUTPATIENT_CLINIC_OR_DEPARTMENT_OTHER): Payer: Medicare Other | Admitting: Internal Medicine

## 2018-12-09 VITALS — BP 109/73 | HR 73 | Temp 98.2°F | Resp 18 | Ht 62.0 in | Wt 114.6 lb

## 2018-12-09 DIAGNOSIS — C3491 Malignant neoplasm of unspecified part of right bronchus or lung: Secondary | ICD-10-CM

## 2018-12-09 DIAGNOSIS — C3490 Malignant neoplasm of unspecified part of unspecified bronchus or lung: Secondary | ICD-10-CM

## 2018-12-09 DIAGNOSIS — Z5112 Encounter for antineoplastic immunotherapy: Secondary | ICD-10-CM | POA: Diagnosis not present

## 2018-12-09 DIAGNOSIS — C3412 Malignant neoplasm of upper lobe, left bronchus or lung: Secondary | ICD-10-CM | POA: Diagnosis present

## 2018-12-09 DIAGNOSIS — J700 Acute pulmonary manifestations due to radiation: Secondary | ICD-10-CM

## 2018-12-09 DIAGNOSIS — C3411 Malignant neoplasm of upper lobe, right bronchus or lung: Secondary | ICD-10-CM

## 2018-12-09 DIAGNOSIS — Z95828 Presence of other vascular implants and grafts: Secondary | ICD-10-CM

## 2018-12-09 LAB — CMP (CANCER CENTER ONLY)
ALT: 8 U/L (ref 0–44)
AST: 16 U/L (ref 15–41)
Albumin: 3.2 g/dL — ABNORMAL LOW (ref 3.5–5.0)
Alkaline Phosphatase: 89 U/L (ref 38–126)
Anion gap: 8 (ref 5–15)
BUN: 20 mg/dL (ref 8–23)
CO2: 24 mmol/L (ref 22–32)
Calcium: 8.4 mg/dL — ABNORMAL LOW (ref 8.9–10.3)
Chloride: 108 mmol/L (ref 98–111)
Creatinine: 0.8 mg/dL (ref 0.44–1.00)
GFR, Est AFR Am: >60 mL/min (ref >60)
GFR, Estimated: >60 mL/min (ref >60)
Glucose, Bld: 100 mg/dL — ABNORMAL HIGH (ref 70–99)
Potassium: 4.1 mmol/L (ref 3.5–5.1)
Sodium: 140 mmol/L (ref 135–145)
Total Bilirubin: 0.5 mg/dL (ref 0.3–1.2)
Total Protein: 7.1 g/dL (ref 6.5–8.1)

## 2018-12-09 LAB — CBC WITH DIFFERENTIAL (CANCER CENTER ONLY)
Abs Immature Granulocytes: 0.01 10*3/uL (ref 0.00–0.07)
Basophils Absolute: 0 10*3/uL (ref 0.0–0.1)
Basophils Relative: 0 %
Eosinophils Absolute: 0 10*3/uL (ref 0.0–0.5)
Eosinophils Relative: 1 %
HCT: 38.2 % (ref 36.0–46.0)
Hemoglobin: 12.4 g/dL (ref 12.0–15.0)
Immature Granulocytes: 0 %
Lymphocytes Relative: 38 %
Lymphs Abs: 1.9 10*3/uL (ref 0.7–4.0)
MCH: 31.2 pg (ref 26.0–34.0)
MCHC: 32.5 g/dL (ref 30.0–36.0)
MCV: 96 fL (ref 80.0–100.0)
Monocytes Absolute: 0.4 10*3/uL (ref 0.1–1.0)
Monocytes Relative: 8 %
Neutro Abs: 2.7 10*3/uL (ref 1.7–7.7)
Neutrophils Relative %: 53 %
Platelet Count: 226 10*3/uL (ref 150–400)
RBC: 3.98 MIL/uL (ref 3.87–5.11)
RDW: 14.2 % (ref 11.5–15.5)
WBC Count: 5.1 10*3/uL (ref 4.0–10.5)
nRBC: 0 % (ref 0.0–0.2)

## 2018-12-09 MED ORDER — SODIUM CHLORIDE 0.9% FLUSH
10.0000 mL | INTRAVENOUS | Status: DC | PRN
  Administered 2018-12-09: 10 mL
  Filled 2018-12-09: qty 10

## 2018-12-09 MED ORDER — HEPARIN SOD (PORK) LOCK FLUSH 100 UNIT/ML IV SOLN
500.0000 [IU] | Freq: Once | INTRAVENOUS | Status: AC | PRN
  Administered 2018-12-09: 500 [IU]
  Filled 2018-12-09: qty 5

## 2018-12-09 MED ORDER — SODIUM CHLORIDE 0.9 % IV SOLN
480.0000 mg | Freq: Once | INTRAVENOUS | Status: AC
  Administered 2018-12-09: 480 mg via INTRAVENOUS
  Filled 2018-12-09: qty 48

## 2018-12-09 MED ORDER — SODIUM CHLORIDE 0.9 % IV SOLN
Freq: Once | INTRAVENOUS | Status: AC
  Administered 2018-12-09: 11:00:00 via INTRAVENOUS
  Filled 2018-12-09: qty 250

## 2018-12-09 MED ORDER — SODIUM CHLORIDE 0.9% FLUSH
10.0000 mL | INTRAVENOUS | Status: DC | PRN
  Administered 2018-12-09: 12:00:00 10 mL
  Filled 2018-12-09: qty 10

## 2018-12-09 NOTE — Progress Notes (Signed)
[facility] Telephone:(336) [phone]   Fax:(336) [phone]  OFFICE PROGRESS NOTE  [name], MD [place] [address]  DIAGNOSIS: Recurrent non-small cell lung cancer, adenocarcinoma initially diagnosed as stage IB (T2, N0, M0) in June 2008 with tumor size of 8.0 cm.  PRIOR THERAPY: #1 status post left upper lobectomy with lymph node dissection under the care of Dr. Arlyce Dice on 06/29/2006.  #2 status post 4 cycles of adjuvant chemotherapy with cisplatin and Taxotere last dose given 08/01/2006.  #3 status post 6 cycles of systemic chemotherapy with carboplatin and Alimta for disease recurrence last dose given 12/12/2009.  #4 palliative radiotherapy to the enlarging right upper lobe lung mass under the care of Dr. Pablo Ledger completed on 11/14/2015. #5 Maintenance systemic chemotherapy with single agent Alimta 400 MG/M2 every 3 weeks, status post 113 cycles.  CURRENT THERAPY: Second line treatment with immunotherapy with Nivolumab 480 mg IV every 4 weeks.  First dose 09/17/2017.  Status post 16 cycles.  INTERVAL HISTORY: [name] 85 y.o. female returns to the clinic today for follow-up visit.  The patient is feeling fine today with no concerning complaints except for the persistent dry cough.  She takes Tussionex on as-needed basis.  She denied having any chest pain, shortness of breath except with exertion and no hemoptysis.  She has no recent weight loss or night sweats.  She has no nausea, vomiting, diarrhea or constipation.  She denied having any skin rash or itching.  She is here today for evaluation before starting cycle #17 of her treatment.  MEDICAL HISTORY: Past Medical History:  Diagnosis Date  . FHx: chemotherapy 2008&2011   4 cycles cisplatin,taxotere,2008& carboplatin and Alimta 2011  . History of migraine headaches   . Hypercholesterolemia   . lung ca dx'd 06/2006   rt and lt lung  . Lung cancer ([place]) 3/14/1   right-  Adenocarcinoma w/bronchioalveolar features  . Radiation 11/01/15-11/14/15   right upper lobe 30 gray  . Radiation pneumonitis ([place]) 01/24/2016    ALLERGIES:  is allergic to simvastatin.  MEDICATIONS:  Current Outpatient Medications  Medication Sig Dispense Refill  . albuterol (PROVENTIL HFA;VENTOLIN HFA) 108 (90 Base) MCG/ACT inhaler Inhale 1-2 puffs into the lungs every 6 (six) hours as needed for wheezing or shortness of breath. (Patient not taking: Reported on 04/28/2018) 1 Inhaler 2  . chlorpheniramine-HYDROcodone (TUSSIONEX) 10-8 MG/5ML SUER Take 5 mLs by mouth 2 (two) times daily as needed for cough. 140 mL 0  . Multiple Vitamins-Minerals (CENTRUM SILVER PO) Take 1 tablet by mouth daily. Reported on 12/13/2015    . prochlorperazine (COMPAZINE) 10 MG tablet Take 1 tablet (10 mg total) by mouth every 6 (six) hours as needed. (Patient not taking: Reported on 01/07/2018) 15 tablet 1  . vitamin C (ASCORBIC ACID) 500 MG tablet Take 500 mg by mouth daily.     No current facility-administered medications for this visit.     SURGICAL HISTORY:  Past Surgical History:  Procedure Laterality Date  . CATARACT EXTRACTION  2013   bilateral  . left  lowerlung lobectomy Left 06/19/2006   Dr.Burney,Left lower lobectomy  . Porta-cath  2011    REVIEW OF SYSTEMS:  A comprehensive review of systems was negative except for: Respiratory: positive for cough and dyspnea on exertion   PHYSICAL EXAMINATION: General appearance: alert, cooperative and no distress Head: Normocephalic, without obvious abnormality, atraumatic Neck: no adenopathy, no JVD, supple, symmetrical, trachea midline and thyroid not enlarged,  symmetric, no tenderness/mass/nodules Lymph nodes: Cervical, supraclavicular, and axillary nodes normal. Resp: rales bilaterally Back: symmetric, no curvature. ROM normal. No CVA tenderness. Cardio: regular rate and rhythm, S1, S2 normal, no murmur, click, rub or gallop and normal apical impulse GI:  soft, non-tender; bowel sounds normal; no masses,  no organomegaly Extremities: extremities normal, atraumatic, no cyanosis or edema  ECOG PERFORMANCE STATUS: 1 - Symptomatic but completely ambulatory  Blood pressure 109/73, pulse 73, temperature 98.2 F (36.8 C), resp. rate 18, height 5\' 2"  (1.575 m), weight 114 lb 9.6 oz (52 kg), SpO2 100 %.  LABORATORY DATA: Lab Results  Component Value Date   WBC 5.1 12/09/2018   HGB 12.4 12/09/2018   HCT 38.2 12/09/2018   MCV 96.0 12/09/2018   PLT 226 12/09/2018      Chemistry      Component Value Date/Time   NA 140 11/11/2018 1026   NA 142 05/28/2017 1015   K 4.1 11/11/2018 1026   K 4.2 05/28/2017 1015   CL 108 11/11/2018 1026   CL 108 (H) 11/04/2012 0947   CO2 23 11/11/2018 1026   CO2 23 05/28/2017 1015   BUN 17 11/11/2018 1026   BUN 14.2 05/28/2017 1015   CREATININE 0.83 11/11/2018 1026   CREATININE 0.9 05/28/2017 1015      Component Value Date/Time   CALCIUM 8.7 (L) 11/11/2018 1026   CALCIUM 9.0 05/28/2017 1015   ALKPHOS 106 11/11/2018 1026   ALKPHOS 84 05/28/2017 1015   AST 24 11/11/2018 1026   AST 19 05/28/2017 1015   ALT 10 11/11/2018 1026   ALT 7 05/28/2017 1015   BILITOT 0.6 11/11/2018 1026   BILITOT 0.54 05/28/2017 1015       RADIOGRAPHIC STUDIES: No results found.  ASSESSMENT AND PLAN:  This is a very pleasant 85 years old white female with recurrent non-small cell lung cancer, adenocarcinoma status post induction systemic chemotherapy with carboplatin and Alimta with partial response. She is currently on maintenance treatment with single agent Alimta status post 113 cycles. The patient has been tolerating this treatment well with no concerning complaints. Unfortunately restaging scan after cycle 113 showed evidence for disease progression. The patient was started on second line treatment with immunotherapy with Nivolumab 480 mg IV every 4 weeks status post 16 cycles. I recommended for the patient to continue  her treatment with nivolumab and she will proceed with cycle #17 today. For the dry cough, she will continue Tussionex. I will see her back for follow-up visit in 4 weeks for evaluation with repeat CT scan of the chest for restaging of her disease. She was advised to call immediately if she has any concerning symptoms in the interval. The patient voices understanding of current disease status and treatment options and is in agreement with the current care plan. All questions were answered. The patient knows to call the clinic with any problems, questions or concerns. We can certainly see the patient much sooner if necessary.  Disclaimer: This note was dictated with voice recognition software. Similar sounding words can inadvertently be transcribed and may not be corrected upon review.

## 2018-12-09 NOTE — Patient Instructions (Signed)
[facility] Discharge Instructions for Patients Receiving Chemotherapy  Today you received the following chemotherapy agents Opdivo  To help prevent nausea and vomiting after your treatment, we encourage you to take your nausea medication as directed   If you develop nausea and vomiting that is not controlled by your nausea medication, call the clinic.   BELOW ARE SYMPTOMS THAT SHOULD BE REPORTED IMMEDIATELY:  *FEVER GREATER THAN 100.5 F  *CHILLS WITH OR WITHOUT FEVER  NAUSEA AND VOMITING THAT IS NOT CONTROLLED WITH YOUR NAUSEA MEDICATION  *UNUSUAL SHORTNESS OF BREATH  *UNUSUAL BRUISING OR BLEEDING  TENDERNESS IN MOUTH AND THROAT WITH OR WITHOUT PRESENCE OF ULCERS  *URINARY PROBLEMS  *BOWEL PROBLEMS  UNUSUAL RASH Items with * indicate a potential emergency and should be followed up as soon as possible.  Feel free to call the clinic should you have any questions or concerns. The clinic phone number is [phone].  Please show the CHEMO ALERT CARD at check-in to the Emergency Department and triage nurse.   

## 2018-12-09 NOTE — Patient Instructions (Signed)

## 2018-12-14 ENCOUNTER — Other Ambulatory Visit: Payer: Self-pay | Admitting: *Deleted

## 2018-12-14 DIAGNOSIS — C3490 Malignant neoplasm of unspecified part of unspecified bronchus or lung: Secondary | ICD-10-CM

## 2019-01-04 ENCOUNTER — Other Ambulatory Visit: Payer: Self-pay

## 2019-01-04 ENCOUNTER — Other Ambulatory Visit: Payer: Self-pay | Admitting: Internal Medicine

## 2019-01-04 ENCOUNTER — Telehealth: Payer: Self-pay | Admitting: Medical Oncology

## 2019-01-04 ENCOUNTER — Ambulatory Visit (HOSPITAL_COMMUNITY)
Admission: RE | Admit: 2019-01-04 | Discharge: 2019-01-04 | Disposition: A | Discharge status: Home / Self Care | Payer: Medicare Other | Source: Ambulatory Visit | Attending: Internal Medicine | Admitting: Internal Medicine

## 2019-01-04 ENCOUNTER — Encounter (HOSPITAL_COMMUNITY): Payer: Self-pay

## 2019-01-04 DIAGNOSIS — R059 Cough, unspecified: Secondary | ICD-10-CM

## 2019-01-04 DIAGNOSIS — J700 Acute pulmonary manifestations due to radiation: Secondary | ICD-10-CM

## 2019-01-04 DIAGNOSIS — R05 Cough: Secondary | ICD-10-CM

## 2019-01-04 DIAGNOSIS — C3491 Malignant neoplasm of unspecified part of right bronchus or lung: Secondary | ICD-10-CM

## 2019-01-04 DIAGNOSIS — C3490 Malignant neoplasm of unspecified part of unspecified bronchus or lung: Secondary | ICD-10-CM | POA: Insufficient documentation

## 2019-01-04 MED ORDER — HYDROCOD POLST-CPM POLST ER 10-8 MG/5ML PO SUER: 5.0000 mL | Freq: Two times a day (BID) | ORAL | 0 refills | Status: DC | PRN

## 2019-01-04 NOTE — Telephone Encounter (Signed)
Tussionex refill-cough persists.

## 2019-01-06 ENCOUNTER — Inpatient Hospital Stay: Payer: Medicare Other | Admitting: Internal Medicine

## 2019-01-06 ENCOUNTER — Telehealth: Payer: Self-pay | Admitting: Internal Medicine

## 2019-01-06 ENCOUNTER — Inpatient Hospital Stay: Payer: Medicare Other

## 2019-01-06 ENCOUNTER — Inpatient Hospital Stay: Payer: Medicare Other | Attending: Internal Medicine

## 2019-01-06 ENCOUNTER — Encounter: Payer: Self-pay | Admitting: Internal Medicine

## 2019-01-06 ENCOUNTER — Other Ambulatory Visit: Payer: Self-pay

## 2019-01-06 VITALS — BP 138/85 | HR 76 | Temp 99.1°F | Resp 17 | Ht 62.0 in | Wt 117.0 lb

## 2019-01-06 DIAGNOSIS — C3491 Malignant neoplasm of unspecified part of right bronchus or lung: Secondary | ICD-10-CM

## 2019-01-06 DIAGNOSIS — J700 Acute pulmonary manifestations due to radiation: Secondary | ICD-10-CM

## 2019-01-06 DIAGNOSIS — E7800 Pure hypercholesterolemia, unspecified: Secondary | ICD-10-CM | POA: Diagnosis not present

## 2019-01-06 DIAGNOSIS — C3492 Malignant neoplasm of unspecified part of left bronchus or lung: Secondary | ICD-10-CM

## 2019-01-06 DIAGNOSIS — C3412 Malignant neoplasm of upper lobe, left bronchus or lung: Secondary | ICD-10-CM | POA: Diagnosis present

## 2019-01-06 DIAGNOSIS — Z5112 Encounter for antineoplastic immunotherapy: Secondary | ICD-10-CM | POA: Diagnosis not present

## 2019-01-06 DIAGNOSIS — Z923 Personal history of irradiation: Secondary | ICD-10-CM | POA: Insufficient documentation

## 2019-01-06 DIAGNOSIS — Z95828 Presence of other vascular implants and grafts: Secondary | ICD-10-CM

## 2019-01-06 DIAGNOSIS — Z79899 Other long term (current) drug therapy: Secondary | ICD-10-CM | POA: Diagnosis not present

## 2019-01-06 LAB — CBC WITH DIFFERENTIAL (CANCER CENTER ONLY)
Abs Immature Granulocytes: 0.01 10*3/uL (ref 0.00–0.07)
Basophils Absolute: 0 10*3/uL (ref 0.0–0.1)
Basophils Relative: 0 %
Eosinophils Absolute: 0 10*3/uL (ref 0.0–0.5)
Eosinophils Relative: 0 %
HCT: 37.6 % (ref 36.0–46.0)
Hemoglobin: 12.5 g/dL (ref 12.0–15.0)
Immature Granulocytes: 0 %
Lymphocytes Relative: 26 %
Lymphs Abs: 1.6 10*3/uL (ref 0.7–4.0)
MCH: 31.8 pg (ref 26.0–34.0)
MCHC: 33.2 g/dL (ref 30.0–36.0)
MCV: 95.7 fL (ref 80.0–100.0)
Monocytes Absolute: 0.4 10*3/uL (ref 0.1–1.0)
Monocytes Relative: 6 %
Neutro Abs: 4.3 10*3/uL (ref 1.7–7.7)
Neutrophils Relative %: 68 %
Platelet Count: 224 10*3/uL (ref 150–400)
RBC: 3.93 MIL/uL (ref 3.87–5.11)
RDW: 13.8 % (ref 11.5–15.5)
WBC Count: 6.3 10*3/uL (ref 4.0–10.5)
nRBC: 0 % (ref 0.0–0.2)

## 2019-01-06 LAB — CMP (CANCER CENTER ONLY)
ALT: 8 U/L (ref 0–44)
AST: 17 U/L (ref 15–41)
Albumin: 3.3 g/dL — ABNORMAL LOW (ref 3.5–5.0)
Alkaline Phosphatase: 91 U/L (ref 38–126)
Anion gap: 11 (ref 5–15)
BUN: 19 mg/dL (ref 8–23)
CO2: 22 mmol/L (ref 22–32)
Calcium: 8.7 mg/dL — ABNORMAL LOW (ref 8.9–10.3)
Chloride: 106 mmol/L (ref 98–111)
Creatinine: 0.83 mg/dL (ref 0.44–1.00)
GFR, Est AFR Am: >60 mL/min (ref >60)
GFR, Estimated: >60 mL/min (ref >60)
Glucose, Bld: 130 mg/dL — ABNORMAL HIGH (ref 70–99)
Potassium: 3.9 mmol/L (ref 3.5–5.1)
Sodium: 139 mmol/L (ref 135–145)
Total Bilirubin: 0.4 mg/dL (ref 0.3–1.2)
Total Protein: 6.9 g/dL (ref 6.5–8.1)

## 2019-01-06 MED ORDER — HEPARIN SOD (PORK) LOCK FLUSH 100 UNIT/ML IV SOLN
500.0000 [IU] | Freq: Once | INTRAVENOUS | Status: AC | PRN
  Administered 2019-01-06: 500 [IU]
  Filled 2019-01-06: qty 5

## 2019-01-06 MED ORDER — SODIUM CHLORIDE 0.9 % IV SOLN
Freq: Once | INTRAVENOUS | Status: DC
  Filled 2019-01-06: qty 250

## 2019-01-06 MED ORDER — SODIUM CHLORIDE 0.9% FLUSH
10.0000 mL | INTRAVENOUS | Status: DC | PRN
  Administered 2019-01-06: 11:00:00 10 mL
  Filled 2019-01-06: qty 10

## 2019-01-06 MED ORDER — SODIUM CHLORIDE 0.9% FLUSH
10.0000 mL | INTRAVENOUS | Status: DC | PRN
  Administered 2019-01-06: 10 mL
  Filled 2019-01-06: qty 10

## 2019-01-06 MED ORDER — SODIUM CHLORIDE 0.9 % IV SOLN
480.0000 mg | Freq: Once | INTRAVENOUS | Status: AC
  Administered 2019-01-06: 480 mg via INTRAVENOUS
  Filled 2019-01-06: qty 48

## 2019-01-06 NOTE — Patient Instructions (Signed)
[facility] Discharge Instructions for Patients Receiving Chemotherapy  Today you received the following chemotherapy agents: Opdivo  To help prevent nausea and vomiting after your treatment, we encourage you to take your nausea medication as directed by your MD.   If you develop nausea and vomiting that is not controlled by your nausea medication, call the clinic.   BELOW ARE SYMPTOMS THAT SHOULD BE REPORTED IMMEDIATELY:  *FEVER GREATER THAN 100.5 F  *CHILLS WITH OR WITHOUT FEVER  NAUSEA AND VOMITING THAT IS NOT CONTROLLED WITH YOUR NAUSEA MEDICATION  *UNUSUAL SHORTNESS OF BREATH  *UNUSUAL BRUISING OR BLEEDING  TENDERNESS IN MOUTH AND THROAT WITH OR WITHOUT PRESENCE OF ULCERS  *URINARY PROBLEMS  *BOWEL PROBLEMS  UNUSUAL RASH Items with * indicate a potential emergency and should be followed up as soon as possible.  Feel free to call the clinic should you have any questions or concerns. The clinic phone number is [phone].  Please show the [facility] at check-in to the Emergency Department and triage nurse. Coronavirus (COVID-19) Are you at risk?  Are you at risk for the Coronavirus (COVID-19)?  To be considered HIGH RISK for Coronavirus (COVID-19), you have to meet the following criteria:  . Traveled to [place], [place], [place], [place] or [place]; or in the [place] to [place], [place], [place], or [place]; and have fever, cough, and shortness of breath within the last 2 weeks of travel OR . Been in close contact with a person diagnosed with COVID-19 within the last 2 weeks and have fever, cough, and shortness of breath . IF YOU DO NOT MEET THESE CRITERIA, YOU ARE CONSIDERED LOW RISK FOR COVID-19.  What to do if you are HIGH RISK for COVID-19?  Marland Kitchen If you are having a medical emergency, call 911. . Seek medical care right away. Before you go to a doctor's office, urgent care or emergency department, call ahead and tell them about  your recent travel, contact with someone diagnosed with COVID-19, and your symptoms. You should receive instructions from your physician's office regarding next steps of care.  . When you arrive at healthcare provider, tell the healthcare staff immediately you have returned from visiting [place], [place], [place], [place] or [place]; or traveled in the [place] to [place], [place], [place], or [place]; in the last two weeks or you have been in close contact with a person diagnosed with COVID-19 in the last 2 weeks.   . Tell the health care staff about your symptoms: fever, cough and shortness of breath. . After you have been seen by a medical provider, you will be either: o Tested for (COVID-19) and discharged home on quarantine except to seek medical care if symptoms worsen, and asked to  - Stay home and avoid contact with others until you get your results (4-5 days)  - Avoid travel on public transportation if possible (such as bus, train, or airplane) or o Sent to the Emergency Department by EMS for evaluation, COVID-19 testing, and possible admission depending on your condition and test results.  What to do if you are LOW RISK for COVID-19?  Reduce your risk of any infection by using the same precautions used for avoiding the common cold or flu:  Marland Kitchen Wash your hands often with soap and warm water for at least 20 seconds.  If soap and water are not readily available, use an alcohol-based hand sanitizer with at least 60% alcohol.  . If coughing  or sneezing, cover your mouth and nose by coughing or sneezing into the elbow areas of your shirt or coat, into a tissue or into your sleeve (not your hands). . Avoid shaking hands with others and consider head nods or verbal greetings only. . Avoid touching your eyes, nose, or mouth with unwashed hands.  . Avoid close contact with people who are sick. . Avoid places or events with large numbers of people in one location, like concerts or sporting  events. . Carefully consider travel plans you have or are making. . If you are planning any travel outside or inside the [place], visit the CDC's Travelers' Health webpage for the latest health notices. . If you have some symptoms but not all symptoms, continue to monitor at home and seek medical attention if your symptoms worsen. . If you are having a medical emergency, call 911.   [place] / e-Visit: [email or web address]         [facility]: [facility]: [phone]                   [facility]: [phone]

## 2019-01-06 NOTE — Progress Notes (Signed)
[facility] [phone]   Fax:([phone]  OFFICE PROGRESS NOTE  [name], MD [place] [address]  DIAGNOSIS: Recurrent non-small cell lung cancer, adenocarcinoma initially diagnosed as stage IB (T2, N0, M0) in June 2008 with tumor size of 8.0 cm.  PRIOR THERAPY: #1 status post left upper lobectomy with lymph node dissection under the care of Dr. Arlyce Dice on 06/29/2006.  #2 status post 4 cycles of adjuvant chemotherapy with cisplatin and Taxotere last dose given 08/01/2006.  #3 status post 6 cycles of systemic chemotherapy with carboplatin and Alimta for disease recurrence last dose given 12/12/2009.  #4 palliative radiotherapy to the enlarging right upper lobe lung mass under the care of Dr. Pablo Ledger completed on 11/14/2015. #5 Maintenance systemic chemotherapy with single agent Alimta 400 MG/M2 every 3 weeks, status post 113 cycles.  CURRENT THERAPY: Second line treatment with immunotherapy with Nivolumab 480 mg IV every 4 weeks.  First dose 09/17/2017.  Status post 17 cycles.  INTERVAL HISTORY: [name] 85 y.o. female returns to the clinic today for follow-up visit.  The patient is feeling fine today with no concerning complaints except for the persistent dry cough and she is using Tussionex on as-needed basis mainly at nighttime.  She denied having any chest pain, shortness of breath or hemoptysis.  She denied having any recent weight loss or night sweats.  She has no nausea, vomiting, diarrhea or constipation.  She has no significant weight loss or night sweats.  She has no headache or visual changes.  She continues to tolerate her treatment with nivolumab fairly well.  The patient had repeat CT scan of the chest performed recently and she is here for evaluation and discussion of her scan results.   MEDICAL HISTORY: Past Medical History:  Diagnosis Date   FHx: chemotherapy 2008&2011   4 cycles  cisplatin,taxotere,2008& carboplatin and Alimta 2011   History of migraine headaches    Hypercholesterolemia    lung ca dx'd 06/2006   rt and lt lung   Lung cancer ([facility]) 3/14/1   right- Adenocarcinoma w/bronchioalveolar features   Radiation 11/01/15-11/14/15   right upper lobe 30 gray   Radiation pneumonitis ([facility]) 01/24/2016    ALLERGIES:  is allergic to simvastatin.  MEDICATIONS:  Current Outpatient Medications  Medication Sig Dispense Refill   albuterol (PROVENTIL HFA;VENTOLIN HFA) 108 (90 Base) MCG/ACT inhaler Inhale 1-2 puffs into the lungs every 6 (six) hours as needed for wheezing or shortness of breath. (Patient not taking: Reported on 04/28/2018) 1 Inhaler 2   chlorpheniramine-HYDROcodone (TUSSIONEX) 10-8 MG/5ML SUER Take 5 mLs by mouth 2 (two) times daily as needed for cough. 140 mL 0   Multiple Vitamins-Minerals (CENTRUM SILVER PO) Take 1 tablet by mouth daily. Reported on 12/13/2015     vitamin C (ASCORBIC ACID) 500 MG tablet Take 500 mg by mouth daily.     No current facility-administered medications for this visit.     SURGICAL HISTORY:  Past Surgical History:  Procedure Laterality Date   CATARACT EXTRACTION  2013   bilateral   left  lowerlung lobectomy Left 06/19/2006   Dr.Burney,Left lower lobectomy   Porta-cath  2011    REVIEW OF SYSTEMS:  Constitutional: positive for fatigue Eyes: negative Ears, nose, mouth, throat, and face: negative Respiratory: positive for cough Cardiovascular: negative Gastrointestinal: negative Genitourinary:negative Integument/breast: negative Hematologic/lymphatic: negative Musculoskeletal:negative Neurological: negative Behavioral/Psych: negative Endocrine: negative Allergic/Immunologic: negative   PHYSICAL EXAMINATION: General appearance: alert, cooperative and no  distress Head: Normocephalic, without obvious abnormality, atraumatic Neck: no adenopathy, no JVD, supple, symmetrical, trachea midline and thyroid not  enlarged, symmetric, no tenderness/mass/nodules Lymph nodes: Cervical, supraclavicular, and axillary nodes normal. Resp: clear to auscultation bilaterally Back: symmetric, no curvature. ROM normal. No CVA tenderness. Cardio: regular rate and rhythm, S1, S2 normal, no murmur, click, rub or gallop and normal apical impulse GI: soft, non-tender; bowel sounds normal; no masses,  no organomegaly Extremities: extremities normal, atraumatic, no cyanosis or edema Neurologic: Alert and oriented X 3, normal strength and tone. Normal symmetric reflexes. Normal coordination and gait  ECOG PERFORMANCE STATUS: 1 - Symptomatic but completely ambulatory  Blood pressure 138/85, pulse 76, temperature 99.1 F (37.3 C), resp. rate 17, height 5\' 2"  (1.575 m), weight 117 lb (53.1 kg), SpO2 100 %.  LABORATORY DATA: Lab Results  Component Value Date   WBC 6.3 01/06/2019   HGB 12.5 01/06/2019   HCT 37.6 01/06/2019   MCV 95.7 01/06/2019   PLT 224 01/06/2019      Chemistry      Component Value Date/Time   NA 140 12/09/2018 0952   NA 142 05/28/2017 1015   K 4.1 12/09/2018 0952   K 4.2 05/28/2017 1015   CL 108 12/09/2018 0952   CL 108 (H) 11/04/2012 0947   CO2 24 12/09/2018 0952   CO2 23 05/28/2017 1015   BUN 20 12/09/2018 0952   BUN 14.2 05/28/2017 1015   CREATININE 0.80 12/09/2018 0952   CREATININE 0.9 05/28/2017 1015      Component Value Date/Time   CALCIUM 8.4 (L) 12/09/2018 0952   CALCIUM 9.0 05/28/2017 1015   ALKPHOS 89 12/09/2018 0952   ALKPHOS 84 05/28/2017 1015   AST 16 12/09/2018 0952   AST 19 05/28/2017 1015   ALT 8 12/09/2018 0952   ALT 7 05/28/2017 1015   BILITOT 0.5 12/09/2018 0952   BILITOT 0.54 05/28/2017 1015       RADIOGRAPHIC STUDIES: Ct Chest Wo Contrast  Result Date: 01/04/2019 CLINICAL DATA:  Lung cancer. Ongoing immunotherapy. Previous radiation therapy and chemotherapy. Post radiation treatment cough. Shortness of breath on exertion. EXAM: CT CHEST WITHOUT CONTRAST  TECHNIQUE: Multidetector CT imaging of the chest was performed following the standard protocol without IV contrast. COMPARISON:  08/17/2018. FINDINGS: Cardiovascular: Right IJ Port-A-Cath terminates in the low SVC. Atherosclerotic calcification of the aorta, aortic valve and coronary arteries. Heart size normal. No pericardial effusion. Mediastinum/Nodes: No pathologically enlarged mediastinal or axillary lymph nodes. Hilar regions are difficult to evaluate without IV contrast. Esophagus is grossly unremarkable. Lungs/Pleura: Large masslike area of consolidation in the suprahilar right hemithorax is seen with surrounding post treatment changes in medial right hemithorax. Associated obstruction of the right upper lobe bronchus, unchanged. Large area of previously seen consolidation in the posteroinferior left upper lobe has decreased in size in the interval, now appearing retractile in appearance (series 7, image 116). Additional areas of peribronchovascular ground-glass and consolidation in the lower hemi thoraces appear similar to minimally improved. Index lesion in the medial right lower lobe measures 11 mm (74), compared to 13 mm previously. Trace right pleural fluid is new. Airway is otherwise unremarkable. Upper Abdomen: Visualized portions of the liver, adrenal glands, kidneys, spleen, pancreas and stomach are grossly unremarkable. Musculoskeletal: Degenerative changes in the spine. IMPRESSION: 1. Interval improvement in areas of peribronchovascular masslike and nodular consolidation in the lower hemi thoraces, indicative of treatment response. 2. Masslike consolidation in the suprahilar right hemithorax with surrounding post treatment changes and obstruction of  the right upper lobe bronchus, similar. 3. Trace right pleural fluid, new. 4. Aortic atherosclerosis (ICD10-170.0). Coronary artery calcification. Electronically Signed   By: Lorin Picket M.D.   On: 01/04/2019 14:59    ASSESSMENT AND PLAN:  This  is a very pleasant 85 years old white female with recurrent non-small cell lung cancer, adenocarcinoma status post induction systemic chemotherapy with carboplatin and Alimta with partial response. She is currently on maintenance treatment with single agent Alimta status post 113 cycles. The patient has been tolerating this treatment well with no concerning complaints. Unfortunately restaging scan after cycle 113 showed evidence for disease progression. The patient was started on second line treatment with immunotherapy with Nivolumab 480 mg IV every 4 weeks status post 17 cycles. She has been tolerating this treatment well with no concerning adverse effects. She had repeat CT scan of the chest performed recently.  I personally and independently reviewed the scans and discussed the results with the patient today. Her scan showed improvement in the nodular and masslike consolidation in the lower parts of her lung. I recommended for the patient to continue her current treatment with nivolumab and she will proceed with cycle #18 today. For the cough she will continue with Tussionex on as-needed basis. The patient will come back for follow-up visit in 4 weeks for evaluation before the next cycle of her treatment. She was advised to call immediately if she has any concerning symptoms in the interval. The patient voices understanding of current disease status and treatment options and is in agreement with the current care plan. All questions were answered. The patient knows to call the clinic with any problems, questions or concerns. We can certainly see the patient much sooner if necessary.  Disclaimer: This note was dictated with voice recognition software. Similar sounding words can inadvertently be transcribed and may not be corrected upon review.

## 2019-01-06 NOTE — Telephone Encounter (Signed)
Scheduled appt per 8/05 los - pt to get an updated schedule next visit

## 2019-02-03 ENCOUNTER — Inpatient Hospital Stay: Payer: Medicare Other

## 2019-02-03 ENCOUNTER — Other Ambulatory Visit: Payer: Self-pay

## 2019-02-03 ENCOUNTER — Inpatient Hospital Stay: Payer: Medicare Other | Attending: Internal Medicine

## 2019-02-03 ENCOUNTER — Encounter: Payer: Self-pay | Admitting: Internal Medicine

## 2019-02-03 ENCOUNTER — Inpatient Hospital Stay: Payer: Medicare Other | Admitting: Internal Medicine

## 2019-02-03 VITALS — BP 121/79 | HR 70 | Temp 97.8°F | Resp 18 | Ht 62.0 in | Wt 114.9 lb

## 2019-02-03 DIAGNOSIS — Z923 Personal history of irradiation: Secondary | ICD-10-CM | POA: Insufficient documentation

## 2019-02-03 DIAGNOSIS — Z5112 Encounter for antineoplastic immunotherapy: Secondary | ICD-10-CM | POA: Insufficient documentation

## 2019-02-03 DIAGNOSIS — C3491 Malignant neoplasm of unspecified part of right bronchus or lung: Secondary | ICD-10-CM

## 2019-02-03 DIAGNOSIS — E7800 Pure hypercholesterolemia, unspecified: Secondary | ICD-10-CM | POA: Insufficient documentation

## 2019-02-03 DIAGNOSIS — Z79899 Other long term (current) drug therapy: Secondary | ICD-10-CM | POA: Insufficient documentation

## 2019-02-03 DIAGNOSIS — Z23 Encounter for immunization: Secondary | ICD-10-CM | POA: Diagnosis not present

## 2019-02-03 DIAGNOSIS — Z95828 Presence of other vascular implants and grafts: Secondary | ICD-10-CM

## 2019-02-03 DIAGNOSIS — C3412 Malignant neoplasm of upper lobe, left bronchus or lung: Secondary | ICD-10-CM | POA: Insufficient documentation

## 2019-02-03 DIAGNOSIS — J700 Acute pulmonary manifestations due to radiation: Secondary | ICD-10-CM | POA: Diagnosis not present

## 2019-02-03 DIAGNOSIS — Z902 Acquired absence of lung [part of]: Secondary | ICD-10-CM | POA: Insufficient documentation

## 2019-02-03 DIAGNOSIS — R5383 Other fatigue: Secondary | ICD-10-CM | POA: Diagnosis not present

## 2019-02-03 DIAGNOSIS — C3492 Malignant neoplasm of unspecified part of left bronchus or lung: Secondary | ICD-10-CM | POA: Diagnosis not present

## 2019-02-03 DIAGNOSIS — R05 Cough: Secondary | ICD-10-CM | POA: Diagnosis not present

## 2019-02-03 LAB — CMP (CANCER CENTER ONLY)
ALT: 8 U/L (ref 0–44)
AST: 17 U/L (ref 15–41)
Albumin: 3.4 g/dL — ABNORMAL LOW (ref 3.5–5.0)
Alkaline Phosphatase: 88 U/L (ref 38–126)
Anion gap: 8 (ref 5–15)
BUN: 17 mg/dL (ref 8–23)
CO2: 24 mmol/L (ref 22–32)
Calcium: 8.5 mg/dL — ABNORMAL LOW (ref 8.9–10.3)
Chloride: 106 mmol/L (ref 98–111)
Creatinine: 0.86 mg/dL (ref 0.44–1.00)
GFR, Est AFR Am: >60 mL/min (ref >60)
GFR, Estimated: >60 mL/min (ref >60)
Glucose, Bld: 109 mg/dL — ABNORMAL HIGH (ref 70–99)
Potassium: 3.8 mmol/L (ref 3.5–5.1)
Sodium: 138 mmol/L (ref 135–145)
Total Bilirubin: 0.6 mg/dL (ref 0.3–1.2)
Total Protein: 7.2 g/dL (ref 6.5–8.1)

## 2019-02-03 LAB — CBC WITH DIFFERENTIAL (CANCER CENTER ONLY)
Abs Immature Granulocytes: 0.01 10*3/uL (ref 0.00–0.07)
Basophils Absolute: 0 10*3/uL (ref 0.0–0.1)
Basophils Relative: 1 %
Eosinophils Absolute: 0 10*3/uL (ref 0.0–0.5)
Eosinophils Relative: 1 %
HCT: 39.9 % (ref 36.0–46.0)
Hemoglobin: 13 g/dL (ref 12.0–15.0)
Immature Granulocytes: 0 %
Lymphocytes Relative: 27 %
Lymphs Abs: 1.5 10*3/uL (ref 0.7–4.0)
MCH: 31.3 pg (ref 26.0–34.0)
MCHC: 32.6 g/dL (ref 30.0–36.0)
MCV: 96.1 fL (ref 80.0–100.0)
Monocytes Absolute: 0.4 10*3/uL (ref 0.1–1.0)
Monocytes Relative: 7 %
Neutro Abs: 3.7 10*3/uL (ref 1.7–7.7)
Neutrophils Relative %: 64 %
Platelet Count: 246 10*3/uL (ref 150–400)
RBC: 4.15 MIL/uL (ref 3.87–5.11)
RDW: 13.8 % (ref 11.5–15.5)
WBC Count: 5.7 10*3/uL (ref 4.0–10.5)
nRBC: 0 % (ref 0.0–0.2)

## 2019-02-03 MED ORDER — SODIUM CHLORIDE 0.9 % IV SOLN
Freq: Once | INTRAVENOUS | Status: AC
  Administered 2019-02-03: 11:00:00 via INTRAVENOUS
  Filled 2019-02-03: qty 250

## 2019-02-03 MED ORDER — SODIUM CHLORIDE 0.9% FLUSH
10.0000 mL | INTRAVENOUS | Status: DC | PRN
  Administered 2019-02-03: 10 mL
  Filled 2019-02-03: qty 10

## 2019-02-03 MED ORDER — SODIUM CHLORIDE 0.9 % IV SOLN
480.0000 mg | Freq: Once | INTRAVENOUS | Status: AC
  Administered 2019-02-03: 480 mg via INTRAVENOUS
  Filled 2019-02-03: qty 48

## 2019-02-03 MED ORDER — HEPARIN SOD (PORK) LOCK FLUSH 100 UNIT/ML IV SOLN
500.0000 [IU] | Freq: Once | INTRAVENOUS | Status: AC | PRN
  Administered 2019-02-03: 500 [IU]
  Filled 2019-02-03: qty 5

## 2019-02-03 MED ORDER — SODIUM CHLORIDE 0.9% FLUSH
10.0000 mL | INTRAVENOUS | Status: DC | PRN
  Administered 2019-02-03: 10:00:00 10 mL
  Filled 2019-02-03: qty 10

## 2019-02-03 NOTE — Progress Notes (Signed)
[facility] [phone]   [phone]) [phone]  OFFICE PROGRESS NOTE  [name], MD [place] [address]  DIAGNOSIS: Recurrent non-small cell lung cancer, adenocarcinoma initially diagnosed as stage IB (T2, N0, M0) in June 2008 with tumor size of 8.0 cm.  PRIOR THERAPY: #1 status post left upper lobectomy with lymph node dissection under the care of Dr. Arlyce Dice on 06/29/2006.  #2 status post 4 cycles of adjuvant chemotherapy with cisplatin and Taxotere last dose given 08/01/2006.  #3 status post 6 cycles of systemic chemotherapy with carboplatin and Alimta for disease recurrence last dose given 12/12/2009.  #4 palliative radiotherapy to the enlarging right upper lobe lung mass under the care of Dr. Pablo Ledger completed on 11/14/2015. #5 Maintenance systemic chemotherapy with single agent Alimta 400 MG/M2 every 3 weeks, status post 113 cycles.  CURRENT THERAPY: Second line treatment with immunotherapy with Nivolumab 480 mg IV every 4 weeks.  First dose 09/17/2017.  Status post 18 cycles.  INTERVAL HISTORY: [name] 85 y.o. female returns to the clinic today for follow-up visit.  The patient is feeling fine today with no concerning complaints except for the dry cough.  She also lost 2 pounds since her last visit.  The patient denied having any chest pain, shortness of breath except with exertion with no hemoptysis.  She denied having any fever or chills.  She has no nausea, vomiting, diarrhea or constipation.  She denied having any headache or visual changes.  She continues to tolerate her treatment with nivolumab fairly well.  The patient is here today for evaluation before starting cycle #19.   MEDICAL HISTORY: Past Medical History:  Diagnosis Date  . FHx: chemotherapy 2008&2011   4 cycles cisplatin,taxotere,2008& carboplatin and Alimta 2011  . History of migraine headaches   . Hypercholesterolemia   . lung ca dx'd 06/2006   rt and lt lung  . Lung cancer ([facility]) 3/14/1   right- Adenocarcinoma w/bronchioalveolar features  . Radiation 11/01/15-11/14/15   right upper lobe 30 gray  . Radiation pneumonitis ([facility]) 01/24/2016    ALLERGIES:  is allergic to simvastatin.  MEDICATIONS:  Current Outpatient Medications  Medication Sig Dispense Refill  . albuterol (PROVENTIL HFA;VENTOLIN HFA) 108 (90 Base) MCG/ACT inhaler Inhale 1-2 puffs into the lungs every 6 (six) hours as needed for wheezing or shortness of breath. (Patient not taking: Reported on 04/28/2018) 1 Inhaler 2  . chlorpheniramine-HYDROcodone (TUSSIONEX) 10-8 MG/5ML SUER Take 5 mLs by mouth 2 (two) times daily as needed for cough. 140 mL 0  . Multiple Vitamins-Minerals (CENTRUM SILVER PO) Take 1 tablet by mouth daily. Reported on 12/13/2015    . vitamin C (ASCORBIC ACID) 500 MG tablet Take 500 mg by mouth daily.     No current facility-administered medications for this visit.     SURGICAL HISTORY:  Past Surgical History:  Procedure Laterality Date  . CATARACT EXTRACTION  2013   bilateral  . left  lowerlung lobectomy Left 06/19/2006   Dr.Burney,Left lower lobectomy  . Porta-cath  2011    REVIEW OF SYSTEMS:  A comprehensive review of systems was negative except for: Constitutional: positive for fatigue Respiratory: positive for cough and dyspnea on exertion   PHYSICAL EXAMINATION: General appearance: alert, cooperative and no distress Head: Normocephalic, without obvious abnormality, atraumatic Neck: no adenopathy, no JVD, supple, symmetrical, trachea midline and thyroid not enlarged, symmetric, no tenderness/mass/nodules Lymph nodes: Cervical, supraclavicular, and axillary nodes normal. Resp: clear to auscultation bilaterally  Back: symmetric, no curvature. ROM normal. No CVA tenderness. Cardio: regular rate and rhythm, S1, S2 normal, no murmur, click, rub or gallop and normal apical impulse GI: soft, non-tender; bowel sounds normal; no masses,  no  organomegaly Extremities: extremities normal, atraumatic, no cyanosis or edema  ECOG PERFORMANCE STATUS: 1 - Symptomatic but completely ambulatory  Blood pressure 121/79, pulse 70, temperature 97.8 F (36.6 C), temperature source Oral, resp. rate 18, height 5\' 2"  (1.575 m), weight 114 lb 14.4 oz (52.1 kg), SpO2 100 %.  LABORATORY DATA: Lab Results  Component Value Date   WBC 5.7 02/03/2019   HGB 13.0 02/03/2019   HCT 39.9 02/03/2019   MCV 96.1 02/03/2019   PLT 246 02/03/2019      Chemistry      Component Value Date/Time   NA 139 01/06/2019 1056   NA 142 05/28/2017 1015   K 3.9 01/06/2019 1056   K 4.2 05/28/2017 1015   CL 106 01/06/2019 1056   CL 108 (H) 11/04/2012 0947   CO2 22 01/06/2019 1056   CO2 23 05/28/2017 1015   BUN 19 01/06/2019 1056   BUN 14.2 05/28/2017 1015   CREATININE 0.83 01/06/2019 1056   CREATININE 0.9 05/28/2017 1015      Component Value Date/Time   CALCIUM 8.7 (L) 01/06/2019 1056   CALCIUM 9.0 05/28/2017 1015   ALKPHOS 91 01/06/2019 1056   ALKPHOS 84 05/28/2017 1015   AST 17 01/06/2019 1056   AST 19 05/28/2017 1015   ALT 8 01/06/2019 1056   ALT 7 05/28/2017 1015   BILITOT 0.4 01/06/2019 1056   BILITOT 0.54 05/28/2017 1015       RADIOGRAPHIC STUDIES: Ct Chest Wo Contrast  Result Date: 01/04/2019 CLINICAL DATA:  Lung cancer. Ongoing immunotherapy. Previous radiation therapy and chemotherapy. Post radiation treatment cough. Shortness of breath on exertion. EXAM: CT CHEST WITHOUT CONTRAST TECHNIQUE: Multidetector CT imaging of the chest was performed following the standard protocol without IV contrast. COMPARISON:  08/17/2018. FINDINGS: Cardiovascular: Right IJ Port-A-Cath terminates in the low SVC. Atherosclerotic calcification of the aorta, aortic valve and coronary arteries. Heart size normal. No pericardial effusion. Mediastinum/Nodes: No pathologically enlarged mediastinal or axillary lymph nodes. Hilar regions are difficult to evaluate without  IV contrast. Esophagus is grossly unremarkable. Lungs/Pleura: Large masslike area of consolidation in the suprahilar right hemithorax is seen with surrounding post treatment changes in medial right hemithorax. Associated obstruction of the right upper lobe bronchus, unchanged. Large area of previously seen consolidation in the posteroinferior left upper lobe has decreased in size in the interval, now appearing retractile in appearance (series 7, image 116). Additional areas of peribronchovascular ground-glass and consolidation in the lower hemi thoraces appear similar to minimally improved. Index lesion in the medial right lower lobe measures 11 mm (74), compared to 13 mm previously. Trace right pleural fluid is new. Airway is otherwise unremarkable. Upper Abdomen: Visualized portions of the liver, adrenal glands, kidneys, spleen, pancreas and stomach are grossly unremarkable. Musculoskeletal: Degenerative changes in the spine. IMPRESSION: 1. Interval improvement in areas of peribronchovascular masslike and nodular consolidation in the lower hemi thoraces, indicative of treatment response. 2. Masslike consolidation in the suprahilar right hemithorax with surrounding post treatment changes and obstruction of the right upper lobe bronchus, similar. 3. Trace right pleural fluid, new. 4. Aortic atherosclerosis (ICD10-170.0). Coronary artery calcification. Electronically Signed   By: Lorin Picket M.D.   On: 01/04/2019 14:59    ASSESSMENT AND PLAN:  This is a very pleasant 85 years old  white female with recurrent non-small cell lung cancer, adenocarcinoma status post induction systemic chemotherapy with carboplatin and Alimta with partial response. She is currently on maintenance treatment with single agent Alimta status post 113 cycles. The patient has been tolerating this treatment well with no concerning complaints. Unfortunately restaging scan after cycle 113 showed evidence for disease progression. The  patient was started on second line treatment with immunotherapy with Nivolumab 480 mg IV every 4 weeks status post 18 cycles. The patient continues to tolerate this treatment well with no concerning adverse effects. I recommended for her to proceed with cycle #19 today as planned. For the dry cough, she will continue on Tussionex. I will see her back for follow-up visit in 4 weeks for evaluation before the next cycle of her treatment. The patient was advised to call immediately if she has any concerning symptoms in the interval. The patient voices understanding of current disease status and treatment options and is in agreement with the current care plan. All questions were answered. The patient knows to call the clinic with any problems, questions or concerns. We can certainly see the patient much sooner if necessary.  Disclaimer: This note was dictated with voice recognition software. Similar sounding words can inadvertently be transcribed and may not be corrected upon review.

## 2019-02-03 NOTE — Patient Instructions (Signed)
[facility] Discharge Instructions for Patients Receiving Chemotherapy  Today you received the following chemotherapy agents: Opdivo  To help prevent nausea and vomiting after your treatment, we encourage you to take your nausea medication as directed by your MD.   If you develop nausea and vomiting that is not controlled by your nausea medication, call the clinic.   BELOW ARE SYMPTOMS THAT SHOULD BE REPORTED IMMEDIATELY:  *FEVER GREATER THAN 100.5 F  *CHILLS WITH OR WITHOUT FEVER  NAUSEA AND VOMITING THAT IS NOT CONTROLLED WITH YOUR NAUSEA MEDICATION  *UNUSUAL SHORTNESS OF BREATH  *UNUSUAL BRUISING OR BLEEDING  TENDERNESS IN MOUTH AND THROAT WITH OR WITHOUT PRESENCE OF ULCERS  *URINARY PROBLEMS  *BOWEL PROBLEMS  UNUSUAL RASH Items with * indicate a potential emergency and should be followed up as soon as possible.  Feel free to call the clinic should you have any questions or concerns. The clinic phone number is [phone].  Please show the [facility] at check-in to the Emergency Department and triage nurse. Coronavirus (COVID-19) Are you at risk?  Are you at risk for the Coronavirus (COVID-19)?  To be considered HIGH RISK for Coronavirus (COVID-19), you have to meet the following criteria:  . Traveled to [place], [place], [place], [place] or [place]; or in the [place] to [place], [place], [place], or [place]; and have fever, cough, and shortness of breath within the last 2 weeks of travel OR . Been in close contact with a person diagnosed with COVID-19 within the last 2 weeks and have fever, cough, and shortness of breath . IF YOU DO NOT MEET THESE CRITERIA, YOU ARE CONSIDERED LOW RISK FOR COVID-19.  What to do if you are HIGH RISK for COVID-19?  Marland Kitchen If you are having a medical emergency, call 911. . Seek medical care right away. Before you go to a doctor's office, urgent care or emergency department, call ahead and tell them about  your recent travel, contact with someone diagnosed with COVID-19, and your symptoms. You should receive instructions from your physician's office regarding next steps of care.  . When you arrive at healthcare provider, tell the healthcare staff immediately you have returned from visiting [place], [place], [place], [place] or [place]; or traveled in the [place] to [place], [place], [place], or [place]; in the last two weeks or you have been in close contact with a person diagnosed with COVID-19 in the last 2 weeks.   . Tell the health care staff about your symptoms: fever, cough and shortness of breath. . After you have been seen by a medical provider, you will be either: o Tested for (COVID-19) and discharged home on quarantine except to seek medical care if symptoms worsen, and asked to  - Stay home and avoid contact with others until you get your results (4-5 days)  - Avoid travel on public transportation if possible (such as bus, train, or airplane) or o Sent to the Emergency Department by EMS for evaluation, COVID-19 testing, and possible admission depending on your condition and test results.  What to do if you are LOW RISK for COVID-19?  Reduce your risk of any infection by using the same precautions used for avoiding the common cold or flu:  Marland Kitchen Wash your hands often with soap and warm water for at least 20 seconds.  If soap and water are not readily available, use an alcohol-based hand sanitizer with at least 60% alcohol.  . If coughing  or sneezing, cover your mouth and nose by coughing or sneezing into the elbow areas of your shirt or coat, into a tissue or into your sleeve (not your hands). . Avoid shaking hands with others and consider head nods or verbal greetings only. . Avoid touching your eyes, nose, or mouth with unwashed hands.  . Avoid close contact with people who are sick. . Avoid places or events with large numbers of people in one location, like concerts or sporting  events. . Carefully consider travel plans you have or are making. . If you are planning any travel outside or inside the [place], visit the CDC's Travelers' Health webpage for the latest health notices. . If you have some symptoms but not all symptoms, continue to monitor at home and seek medical attention if your symptoms worsen. . If you are having a medical emergency, call 911.   [place] / e-Visit: [email or web address]         [facility]: [facility]: [phone]                   [facility]: [phone]

## 2019-02-19 ENCOUNTER — Telehealth: Payer: Self-pay | Admitting: *Deleted

## 2019-02-19 ENCOUNTER — Other Ambulatory Visit: Payer: Self-pay | Admitting: Physician Assistant

## 2019-02-19 DIAGNOSIS — C3491 Malignant neoplasm of unspecified part of right bronchus or lung: Secondary | ICD-10-CM

## 2019-02-19 DIAGNOSIS — R059 Cough, unspecified: Secondary | ICD-10-CM

## 2019-02-19 DIAGNOSIS — R05 Cough: Secondary | ICD-10-CM

## 2019-02-19 DIAGNOSIS — C3492 Malignant neoplasm of unspecified part of left bronchus or lung: Secondary | ICD-10-CM

## 2019-02-19 DIAGNOSIS — J700 Acute pulmonary manifestations due to radiation: Secondary | ICD-10-CM

## 2019-02-19 MED ORDER — HYDROCOD POLST-CPM POLST ER 10-8 MG/5ML PO SUER: 5.0000 mL | Freq: Two times a day (BID) | ORAL | 0 refills | Status: DC | PRN

## 2019-02-19 NOTE — Telephone Encounter (Signed)
Received call from pt requesting refill of her Tussionex. Last filled 01/04/19 @ [facility]

## 2019-03-03 ENCOUNTER — Inpatient Hospital Stay: Payer: Medicare Other

## 2019-03-03 ENCOUNTER — Telehealth: Payer: Self-pay | Admitting: Internal Medicine

## 2019-03-03 ENCOUNTER — Other Ambulatory Visit: Payer: Self-pay

## 2019-03-03 ENCOUNTER — Inpatient Hospital Stay: Payer: Medicare Other | Admitting: Physician Assistant

## 2019-03-03 VITALS — BP 145/87 | HR 61 | Temp 98.3°F | Resp 16 | Ht 62.0 in | Wt 112.6 lb

## 2019-03-03 DIAGNOSIS — C3492 Malignant neoplasm of unspecified part of left bronchus or lung: Secondary | ICD-10-CM | POA: Diagnosis not present

## 2019-03-03 DIAGNOSIS — Z23 Encounter for immunization: Secondary | ICD-10-CM

## 2019-03-03 DIAGNOSIS — C3491 Malignant neoplasm of unspecified part of right bronchus or lung: Secondary | ICD-10-CM

## 2019-03-03 DIAGNOSIS — Z5112 Encounter for antineoplastic immunotherapy: Secondary | ICD-10-CM | POA: Diagnosis not present

## 2019-03-03 DIAGNOSIS — Z95828 Presence of other vascular implants and grafts: Secondary | ICD-10-CM

## 2019-03-03 LAB — CBC WITH DIFFERENTIAL (CANCER CENTER ONLY)
Abs Immature Granulocytes: 0.01 K/uL (ref 0.00–0.07)
Basophils Absolute: 0 K/uL (ref 0.0–0.1)
Basophils Relative: 0 %
Eosinophils Absolute: 0 K/uL (ref 0.0–0.5)
Eosinophils Relative: 1 %
HCT: 41.3 % (ref 36.0–46.0)
Hemoglobin: 13.8 g/dL (ref 12.0–15.0)
Immature Granulocytes: 0 %
Lymphocytes Relative: 29 %
Lymphs Abs: 1.6 K/uL (ref 0.7–4.0)
MCH: 31.8 pg (ref 26.0–34.0)
MCHC: 33.4 g/dL (ref 30.0–36.0)
MCV: 95.2 fL (ref 80.0–100.0)
Monocytes Absolute: 0.5 K/uL (ref 0.1–1.0)
Monocytes Relative: 8 %
Neutro Abs: 3.4 K/uL (ref 1.7–7.7)
Neutrophils Relative %: 62 %
Platelet Count: 245 K/uL (ref 150–400)
RBC: 4.34 MIL/uL (ref 3.87–5.11)
RDW: 13.5 % (ref 11.5–15.5)
WBC Count: 5.6 K/uL (ref 4.0–10.5)
nRBC: 0 % (ref 0.0–0.2)

## 2019-03-03 LAB — CMP (CANCER CENTER ONLY)
ALT: 11 U/L (ref 0–44)
AST: 19 U/L (ref 15–41)
Albumin: 3.5 g/dL (ref 3.5–5.0)
Alkaline Phosphatase: 81 U/L (ref 38–126)
Anion gap: 9 (ref 5–15)
BUN: 18 mg/dL (ref 8–23)
CO2: 23 mmol/L (ref 22–32)
Calcium: 8.8 mg/dL — ABNORMAL LOW (ref 8.9–10.3)
Chloride: 107 mmol/L (ref 98–111)
Creatinine: 0.86 mg/dL (ref 0.44–1.00)
GFR, Est AFR Am: >60 mL/min
GFR, Estimated: >60 mL/min
Glucose, Bld: 105 mg/dL — ABNORMAL HIGH (ref 70–99)
Potassium: 4.1 mmol/L (ref 3.5–5.1)
Sodium: 139 mmol/L (ref 135–145)
Total Bilirubin: 0.8 mg/dL (ref 0.3–1.2)
Total Protein: 7 g/dL (ref 6.5–8.1)

## 2019-03-03 MED ORDER — INFLUENZA VAC A&B SA ADJ QUAD 0.5 ML IM PRSY
0.5000 mL | PREFILLED_SYRINGE | Freq: Once | INTRAMUSCULAR | Status: AC
  Administered 2019-03-03: 0.5 mL via INTRAMUSCULAR

## 2019-03-03 MED ORDER — HEPARIN SOD (PORK) LOCK FLUSH 100 UNIT/ML IV SOLN
500.0000 [IU] | Freq: Once | INTRAVENOUS | Status: AC | PRN
  Administered 2019-03-03: 13:00:00 500 [IU]
  Filled 2019-03-03: qty 5

## 2019-03-03 MED ORDER — INFLUENZA VAC A&B SA ADJ QUAD 0.5 ML IM PRSY
PREFILLED_SYRINGE | INTRAMUSCULAR | Status: AC
  Filled 2019-03-03: qty 0.5

## 2019-03-03 MED ORDER — SODIUM CHLORIDE 0.9% FLUSH
10.0000 mL | INTRAVENOUS | Status: DC | PRN
  Administered 2019-03-03: 13:00:00 10 mL
  Filled 2019-03-03: qty 10

## 2019-03-03 MED ORDER — SODIUM CHLORIDE 0.9% FLUSH
10.0000 mL | INTRAVENOUS | Status: DC | PRN
  Administered 2019-03-03: 10 mL
  Filled 2019-03-03: qty 10

## 2019-03-03 MED ORDER — SODIUM CHLORIDE 0.9 % IV SOLN
Freq: Once | INTRAVENOUS | Status: AC
  Administered 2019-03-03: 12:00:00 via INTRAVENOUS
  Filled 2019-03-03: qty 250

## 2019-03-03 MED ORDER — SODIUM CHLORIDE 0.9 % IV SOLN
480.0000 mg | Freq: Once | INTRAVENOUS | Status: AC
  Administered 2019-03-03: 480 mg via INTRAVENOUS
  Filled 2019-03-03: qty 48

## 2019-03-03 NOTE — Progress Notes (Signed)
[facility] OFFICE PROGRESS NOTE  [name], [place] [address]  DIAGNOSIS: Recurrent non-small cell lung cancer, adenocarcinoma initially diagnosed as stage IB (T2, N0, M0) in June 2008 with tumor size of 8.0 cm.  PRIOR THERAPY:  #1 status post left upper lobectomy with lymph node dissection under the care of Dr. Arlyce Dice on 06/29/2006.  #2 status post 4 cycles of adjuvant chemotherapy with cisplatin and Taxotere last dose given 08/01/2006.  #3 status post 6 cycles of systemic chemotherapy with carboplatin and Alimta for disease recurrence last dose given 12/12/2009. #4 palliative radiotherapy to the enlarging right upper lobe lung mass under the care of Dr. Pablo Ledger completed on 11/14/2015. #5 Maintenance systemic chemotherapy with single agent Alimta 400 MG/M2 every 3 weeks, status post 113 cycles.  CURRENT THERAPY: Second line treatment with immunotherapy with Nivolumab 480 mg IV every 4 weeks.  First dose 09/17/2017.  Status post 19 cycles.  INTERVAL HISTORY: [name] 85 y.o. female returns to the clinic for a follow up visit. The patient is feeling well today without any concerning complaints except for fatigue. She states that she sleeps well but continues to feel tired. She takes a multivitamin daily. Otherwise, the patient continues to tolerate immunotherapy with nivolumab well without any adverse side effects. Denies any fever, chills, or night sweats. She lost a few pounds since her last visit. She uses breakfast essentials in the morning. She states that food does not taste the same since being on chemotherapy. She denies any thrush or oral pain. Denies any chest pain or hemoptysis. She reports her baseline cough which has been persistent for several years since developing radiation pneumonitis. She takes tussinex for her cough. She reports her baseline shortness of breath with exertion. Denies any nausea, vomiting, diarrhea, or  constipation. Denies any headache or visual changes. Denies any rashes or skin changes. The patient is here today for evaluation prior to starting cycle #20   MEDICAL HISTORY: Past Medical History:  Diagnosis Date  . FHx: chemotherapy 2008&2011   4 cycles cisplatin,taxotere,2008& carboplatin and Alimta 2011  . History of migraine headaches   . Hypercholesterolemia   . lung ca dx'd 06/2006   rt and lt lung  . Lung cancer ([place]) 3/14/1   right- Adenocarcinoma w/bronchioalveolar features  . Radiation 11/01/15-11/14/15   right upper lobe 30 gray  . Radiation pneumonitis ([place]) 01/24/2016    ALLERGIES:  is allergic to simvastatin.  MEDICATIONS:  Current Outpatient Medications  Medication Sig Dispense Refill  . chlorpheniramine-HYDROcodone (TUSSIONEX) 10-8 MG/5ML SUER Take 5 mLs by mouth 2 (two) times daily as needed for cough. 140 mL 0  . Multiple Vitamins-Minerals (CENTRUM SILVER PO) Take 1 tablet by mouth daily. Reported on 12/13/2015    . vitamin C (ASCORBIC ACID) 500 MG tablet Take 500 mg by mouth daily.    Marland Kitchen albuterol (PROVENTIL HFA;VENTOLIN HFA) 108 (90 Base) MCG/ACT inhaler Inhale 1-2 puffs into the lungs every 6 (six) hours as needed for wheezing or shortness of breath. (Patient not taking: Reported on 04/28/2018) 1 Inhaler 2   No current facility-administered medications for this visit.    Facility-Administered Medications Ordered in Other Visits  Medication Dose Route Frequency Provider Last Rate Last Dose  . sodium chloride flush (NS) 0.9 % injection 10 mL  10 mL Intracatheter PRN Curt Bears, MD   10 mL at 03/03/19 1310    SURGICAL HISTORY:  Past Surgical History:  Procedure Laterality Date  . CATARACT  EXTRACTION  2013   bilateral  . left  lowerlung lobectomy Left 06/19/2006   Dr.Burney,Left lower lobectomy  . Porta-cath  2011    REVIEW OF SYSTEMS:   Review of Systems  Constitutional: Positive for fatigue. Negative for appetite change, chills,  fever and unexpected  weight change.  HENT: Positive for taste changes. Negative for mouth sores, nosebleeds, sore throat and trouble swallowing.   Eyes: Negative for eye problems and icterus.  Respiratory: Positive for baseline cough and shortness of breath with exertion. Negative for hemoptysis and wheezing.   Cardiovascular: Negative for chest pain and leg swelling.  Gastrointestinal: Negative for abdominal pain, constipation, diarrhea, nausea and vomiting.  Genitourinary: Negative for bladder incontinence, difficulty urinating, dysuria, frequency and hematuria.   Musculoskeletal: Negative for back pain, gait problem, neck pain and neck stiffness.  Skin: Negative for itching and rash.  Neurological: Negative for dizziness, extremity weakness, gait problem, headaches, light-headedness and seizures.  Hematological: Negative for adenopathy. Does not bruise/bleed easily.  Psychiatric/Behavioral: Negative for confusion, depression and sleep disturbance. The patient is not nervous/anxious.     PHYSICAL EXAMINATION:  Blood pressure (!) 145/87, pulse 61, temperature 98.3 F (36.8 C), temperature source Temporal, resp. rate 16, height 5\' 2"  (1.575 m), weight 112 lb 9.6 oz (51.1 kg), SpO2 100 %.  ECOG PERFORMANCE STATUS: 1 - Symptomatic but completely ambulatory  Physical Exam  Constitutional: Oriented to person, place, and time and well-developed, well-nourished, and in no distress.  HENT:  Head: Normocephalic and atraumatic.  Mouth/Throat: Oropharynx is clear and moist. No oropharyngeal exudate.  Eyes: Conjunctivae are normal. Right eye exhibits no discharge. Left eye exhibits no discharge. No scleral icterus.  Neck: Normal range of motion. Neck supple.  Cardiovascular: Normal rate, regular rhythm, normal heart sounds and intact distal pulses.   Pulmonary/Chest: Effort normal and breath sounds normal. No respiratory distress. No wheezes. No rales.  Abdominal: Soft. Bowel sounds are normal. Exhibits no distension  and no mass. There is no tenderness.  Musculoskeletal: Normal range of motion. Exhibits no edema.  Lymphadenopathy:    No cervical adenopathy.  Neurological: Alert and oriented to person, place, and time. Exhibits normal muscle tone. Gait normal. Coordination normal.  Skin: Skin is warm and dry. No rash noted. Not diaphoretic. No erythema. No pallor.  Psychiatric: Mood, memory and judgment normal.  Vitals reviewed.  LABORATORY DATA: Lab Results  Component Value Date   WBC 5.6 03/03/2019   HGB 13.8 03/03/2019   HCT 41.3 03/03/2019   MCV 95.2 03/03/2019   PLT 245 03/03/2019      Chemistry      Component Value Date/Time   NA 139 03/03/2019 1015   NA 142 05/28/2017 1015   K 4.1 03/03/2019 1015   K 4.2 05/28/2017 1015   CL 107 03/03/2019 1015   CL 108 (H) 11/04/2012 0947   CO2 23 03/03/2019 1015   CO2 23 05/28/2017 1015   BUN 18 03/03/2019 1015   BUN 14.2 05/28/2017 1015   CREATININE 0.86 03/03/2019 1015   CREATININE 0.9 05/28/2017 1015      Component Value Date/Time   CALCIUM 8.8 (L) 03/03/2019 1015   CALCIUM 9.0 05/28/2017 1015   ALKPHOS 81 03/03/2019 1015   ALKPHOS 84 05/28/2017 1015   AST 19 03/03/2019 1015   AST 19 05/28/2017 1015   ALT 11 03/03/2019 1015   ALT 7 05/28/2017 1015   BILITOT 0.8 03/03/2019 1015   BILITOT 0.54 05/28/2017 1015       RADIOGRAPHIC STUDIES:  No results found.   ASSESSMENT/PLAN:  This is a very pleasant 85 year old Caucasian female with recurrent non-small cell lung cancer, adenocarcinoma.  She initially presented as a stage Ib (T2, N0, M0) in January 2008.  The patient is status post induction with systemic chemotherapy with carboplatin and Alimta with a partial response.  The patient status post 113 cycles of single agent Alimta.  This was discontinued due to evidence of disease progression.  The patient is currently undergoing nivolumab 480 mg IV every 4 weeks.  She is status post 19 cycles.  Labs were reviewed with the  patient. I recommend that she proceed with cycle #20 today scheduled.  We will see the patient back for a follow-up visit in 4 weeks for evaluation before starting cycle #21.  The patient will continue to use tussinex for her dry cough.  The patient will receive a flu shot today while in infusion.   I encouraged the patient to increase her caloric intake.   We will recheck the patients TSH every 4 weeks since immunotherapy can cause thyroid dysfunction and the patient has noted 2 month worsening fatigue. The patient will continue her multivitamins. The patient was advised to call immediately if she has any concerning symptoms in the interval. The patient voices understanding of current disease status and treatment options and is in agreement with the current care plan. All questions were answered. The patient knows to call the clinic with any problems, questions or concerns. We can certainly see the patient much sooner if necessary  Orders Placed This Encounter  Procedures  . TSH    Standing Status:   Standing    Number of Occurrences:   10    Standing Expiration Date:   03/02/2020     Cassandra L Heilingoetter, [name] 03/03/19

## 2019-03-03 NOTE — Patient Instructions (Signed)
Coronavirus (COVID-19) Are you at risk?  Are you at risk for the Coronavirus (COVID-19)?  To be considered HIGH RISK for Coronavirus (COVID-19), you have to meet the following criteria:  . Traveled to [place], [place], [place], [place] or [place]; or in the [place] to [place], [place], [place], or [place]; and have fever, cough, and shortness of breath within the last 2 weeks of travel OR . Been in close contact with a person diagnosed with COVID-19 within the last 2 weeks and have fever, cough, and shortness of breath . IF YOU DO NOT MEET THESE CRITERIA, YOU ARE CONSIDERED LOW RISK FOR COVID-19.  What to do if you are HIGH RISK for COVID-19?  Marland Kitchen If you are having a medical emergency, call 911. . Seek medical care right away. Before you go to a doctor's office, urgent care or emergency department, call ahead and tell them about your recent travel, contact with someone diagnosed with COVID-19, and your symptoms. You should receive instructions from your physician's office regarding next steps of care.  . When you arrive at healthcare provider, tell the healthcare staff immediately you have returned from visiting [place], [place], [place], [place] or [place]; or traveled in the [place] to [place], [place], [place], or [place]; in the last two weeks or you have been in close contact with a person diagnosed with COVID-19 in the last 2 weeks.   . Tell the health care staff about your symptoms: fever, cough and shortness of breath. . After you have been seen by a medical provider, you will be either: o Tested for (COVID-19) and discharged home on quarantine except to seek medical care if symptoms worsen, and asked to  - Stay home and avoid contact with others until you get your results (4-5 days)  - Avoid travel on public transportation if possible (such as bus, train, or airplane) or o Sent to the Emergency Department by EMS for evaluation, COVID-19 testing, and possible  admission depending on your condition and test results.  What to do if you are LOW RISK for COVID-19?  Reduce your risk of any infection by using the same precautions used for avoiding the common cold or flu:  Marland Kitchen Wash your hands often with soap and warm water for at least 20 seconds.  If soap and water are not readily available, use an alcohol-based hand sanitizer with at least 60% alcohol.  . If coughing or sneezing, cover your mouth and nose by coughing or sneezing into the elbow areas of your shirt or coat, into a tissue or into your sleeve (not your hands). . Avoid shaking hands with others and consider head nods or verbal greetings only. . Avoid touching your eyes, nose, or mouth with unwashed hands.  . Avoid close contact with people who are sick. . Avoid places or events with large numbers of people in one location, like concerts or sporting events. . Carefully consider travel plans you have or are making. . If you are planning any travel outside or inside the [place], visit the CDC's Travelers' Health webpage for the latest health notices. . If you have some symptoms but not all symptoms, continue to monitor at home and seek medical attention if your symptoms worsen. . If you are having a medical emergency, call 911.   [place] / e-Visit: [email or web address]         [facility]: [place]  [facility]: [facility]: [facility] Discharge Instructions for Patients Receiving Chemotherapy  Today you received the following chemotherapy agents Opdivo  To help prevent nausea and vomiting after your treatment, we encourage you to take your nausea medication as directed.   If you develop nausea and vomiting that is not controlled by your nausea medication, call the clinic.   BELOW ARE  SYMPTOMS THAT SHOULD BE REPORTED IMMEDIATELY:  *FEVER GREATER THAN 100.5 F  *CHILLS WITH OR WITHOUT FEVER  NAUSEA AND VOMITING THAT IS NOT CONTROLLED WITH YOUR NAUSEA MEDICATION  *UNUSUAL SHORTNESS OF BREATH  *UNUSUAL BRUISING OR BLEEDING  TENDERNESS IN MOUTH AND THROAT WITH OR WITHOUT PRESENCE OF ULCERS  *URINARY PROBLEMS  *BOWEL PROBLEMS  UNUSUAL RASH Items with * indicate a potential emergency and should be followed up as soon as possible.  Feel free to call the clinic should you have any questions or concerns. The clinic phone number is [phone].  Please show the [facility] at check-in to the Emergency Department and triage nurse.

## 2019-03-03 NOTE — Telephone Encounter (Signed)
Scheduled appt per 9/30 los- pt to get an updated schedule next visit.

## 2019-03-12 ENCOUNTER — Other Ambulatory Visit: Payer: Self-pay

## 2019-03-12 ENCOUNTER — Inpatient Hospital Stay (HOSPITAL_COMMUNITY)
Admission: EM | Admit: 2019-03-12 | Discharge: 2019-03-16 | DRG: 253 | Disposition: A | Discharge status: Home / Self Care | Payer: Medicare Other | Attending: Vascular Surgery | Admitting: Vascular Surgery

## 2019-03-12 DIAGNOSIS — Z85118 Personal history of other malignant neoplasm of bronchus and lung: Secondary | ICD-10-CM

## 2019-03-12 DIAGNOSIS — I4892 Unspecified atrial flutter: Secondary | ICD-10-CM | POA: Diagnosis present

## 2019-03-12 DIAGNOSIS — I4891 Unspecified atrial fibrillation: Secondary | ICD-10-CM | POA: Diagnosis present

## 2019-03-12 DIAGNOSIS — I340 Nonrheumatic mitral (valve) insufficiency: Secondary | ICD-10-CM | POA: Diagnosis not present

## 2019-03-12 DIAGNOSIS — Z923 Personal history of irradiation: Secondary | ICD-10-CM | POA: Diagnosis not present

## 2019-03-12 DIAGNOSIS — I361 Nonrheumatic tricuspid (valve) insufficiency: Secondary | ICD-10-CM | POA: Diagnosis not present

## 2019-03-12 DIAGNOSIS — M79601 Pain in right arm: Secondary | ICD-10-CM

## 2019-03-12 DIAGNOSIS — G43909 Migraine, unspecified, not intractable, without status migrainosus: Secondary | ICD-10-CM | POA: Diagnosis present

## 2019-03-12 DIAGNOSIS — I742 Embolism and thrombosis of arteries of the upper extremities: Secondary | ICD-10-CM | POA: Diagnosis present

## 2019-03-12 DIAGNOSIS — Z888 Allergy status to other drugs, medicaments and biological substances status: Secondary | ICD-10-CM

## 2019-03-12 DIAGNOSIS — I429 Cardiomyopathy, unspecified: Secondary | ICD-10-CM | POA: Diagnosis present

## 2019-03-12 DIAGNOSIS — I998 Other disorder of circulatory system: Secondary | ICD-10-CM | POA: Diagnosis present

## 2019-03-12 DIAGNOSIS — E7800 Pure hypercholesterolemia, unspecified: Secondary | ICD-10-CM | POA: Diagnosis present

## 2019-03-12 DIAGNOSIS — I10 Essential (primary) hypertension: Secondary | ICD-10-CM | POA: Diagnosis not present

## 2019-03-12 DIAGNOSIS — E785 Hyperlipidemia, unspecified: Secondary | ICD-10-CM | POA: Diagnosis present

## 2019-03-12 DIAGNOSIS — Z20828 Contact with and (suspected) exposure to other viral communicable diseases: Secondary | ICD-10-CM | POA: Diagnosis present

## 2019-03-12 DIAGNOSIS — C3492 Malignant neoplasm of unspecified part of left bronchus or lung: Secondary | ICD-10-CM | POA: Diagnosis not present

## 2019-03-12 DIAGNOSIS — Z9221 Personal history of antineoplastic chemotherapy: Secondary | ICD-10-CM | POA: Diagnosis not present

## 2019-03-12 DIAGNOSIS — I5020 Unspecified systolic (congestive) heart failure: Secondary | ICD-10-CM | POA: Diagnosis not present

## 2019-03-12 DIAGNOSIS — Z8249 Family history of ischemic heart disease and other diseases of the circulatory system: Secondary | ICD-10-CM | POA: Diagnosis not present

## 2019-03-12 DIAGNOSIS — I483 Typical atrial flutter: Secondary | ICD-10-CM | POA: Diagnosis not present

## 2019-03-12 LAB — CBC
HCT: 44.4 % (ref 36.0–46.0)
Hemoglobin: 14.3 g/dL (ref 12.0–15.0)
MCH: 31.5 pg (ref 26.0–34.0)
MCHC: 32.2 g/dL (ref 30.0–36.0)
MCV: 97.8 fL (ref 80.0–100.0)
Platelets: 286 10*3/uL (ref 150–400)
RBC: 4.54 MIL/uL (ref 3.87–5.11)
RDW: 13.7 % (ref 11.5–15.5)
WBC: 7 10*3/uL (ref 4.0–10.5)
nRBC: 0 % (ref 0.0–0.2)

## 2019-03-12 LAB — BASIC METABOLIC PANEL
Anion gap: 12 (ref 5–15)
BUN: 19 mg/dL (ref 8–23)
CO2: 21 mmol/L — ABNORMAL LOW (ref 22–32)
Calcium: 9.2 mg/dL (ref 8.9–10.3)
Chloride: 103 mmol/L (ref 98–111)
Creatinine, Ser: 0.77 mg/dL (ref 0.44–1.00)
GFR calc Af Amer: >60 mL/min (ref >60)
GFR calc non Af Amer: >60 mL/min (ref >60)
Glucose, Bld: 124 mg/dL — ABNORMAL HIGH (ref 70–99)
Potassium: 4.2 mmol/L (ref 3.5–5.1)
Sodium: 136 mmol/L (ref 135–145)

## 2019-03-12 LAB — PROTIME-INR
INR: 1 (ref 0.8–1.2)
Prothrombin Time: 12.8 seconds (ref 11.4–15.2)

## 2019-03-12 LAB — APTT: aPTT: 32 seconds (ref 24–36)

## 2019-03-12 LAB — SARS CORONAVIRUS 2 BY RT PCR (HOSPITAL ORDER, PERFORMED IN ~~LOC~~ HOSPITAL LAB): SARS Coronavirus 2: NEGATIVE

## 2019-03-12 MED ORDER — DEXTROSE-NACL 5-0.45 % IV SOLN
INTRAVENOUS | Status: DC
  Administered 2019-03-12: via INTRAVENOUS

## 2019-03-12 MED ORDER — HEPARIN (PORCINE) 25000 UT/250ML-% IV SOLN
INTRAVENOUS | Status: AC
  Filled 2019-03-12: qty 250

## 2019-03-12 MED ORDER — PANTOPRAZOLE SODIUM 40 MG PO TBEC
40.0000 mg | DELAYED_RELEASE_TABLET | Freq: Every day | ORAL | Status: DC
  Administered 2019-03-14 – 2019-03-16 (×3): 40 mg via ORAL
  Filled 2019-03-12 (×3): qty 1

## 2019-03-12 MED ORDER — DILTIAZEM HCL-DEXTROSE 125-5 MG/125ML-% IV SOLN (PREMIX)
5.0000 mg/h | INTRAVENOUS | Status: DC
  Administered 2019-03-12: 5 mg/h via INTRAVENOUS
  Filled 2019-03-12: qty 125

## 2019-03-12 MED ORDER — DILTIAZEM LOAD VIA INFUSION
10.0000 mg | Freq: Once | INTRAVENOUS | Status: AC
  Administered 2019-03-12: 10 mg via INTRAVENOUS
  Filled 2019-03-12: qty 10

## 2019-03-12 MED ORDER — LIDOCAINE HCL (PF) 1 % IJ SOLN
INTRAMUSCULAR | Status: AC
  Filled 2019-03-12: qty 30

## 2019-03-12 MED ORDER — FENTANYL CITRATE (PF) 250 MCG/5ML IJ SOLN
INTRAMUSCULAR | Status: AC
  Filled 2019-03-12: qty 5

## 2019-03-12 MED ORDER — SODIUM CHLORIDE 0.9 % IV SOLN
INTRAVENOUS | Status: AC
  Filled 2019-03-12: qty 1.2

## 2019-03-12 MED ORDER — HEPARIN (PORCINE) 25000 UT/250ML-% IV SOLN
700.0000 [IU]/h | INTRAVENOUS | Status: DC
  Administered 2019-03-12: 750 [IU]/h via INTRAVENOUS
  Administered 2019-03-14: 700 [IU]/h via INTRAVENOUS
  Filled 2019-03-12: qty 250

## 2019-03-12 MED ORDER — POTASSIUM CHLORIDE CRYS ER 20 MEQ PO TBCR: 20.0000 meq | EXTENDED_RELEASE_TABLET | Freq: Once | ORAL | Status: DC

## 2019-03-12 MED ORDER — HEPARIN BOLUS VIA INFUSION
1500.0000 [IU] | Freq: Once | INTRAVENOUS | Status: AC
  Administered 2019-03-12: 1500 [IU] via INTRAVENOUS
  Filled 2019-03-12: qty 1500

## 2019-03-12 MED ORDER — CEFAZOLIN SODIUM-DEXTROSE 2-4 GM/100ML-% IV SOLN
2.0000 g | INTRAVENOUS | Status: AC
  Administered 2019-03-12: 2 g via INTRAVENOUS
  Filled 2019-03-12: qty 100

## 2019-03-12 MED ORDER — PROPOFOL 10 MG/ML IV BOLUS
INTRAVENOUS | Status: AC
  Filled 2019-03-12: qty 20

## 2019-03-12 MED ORDER — ONDANSETRON HCL 4 MG/2ML IJ SOLN: 4.0000 mg | Freq: Four times a day (QID) | INTRAMUSCULAR | Status: DC | PRN

## 2019-03-12 NOTE — Progress Notes (Signed)
ANTICOAGULATION CONSULT NOTE - Initial Consult  Pharmacy Consult for heparin Indication: DVT  Allergies  Allergen Reactions  . Simvastatin Other (See Comments)    Cluster migraines    Patient Measurements: Weight: 112 lb (50.8 kg) Heparin Dosing Weight = TBW = 50.8 kg  Vital Signs: Temp: 97.6 F (36.4 C) (10/09 1954) BP: 167/145 (10/09 2002) Pulse Rate: 159 (10/09 2023)  Labs: No results for input(s): HGB, HCT, PLT, APTT, LABPROT, INR, HEPARINUNFRC, HEPRLOWMOCWT, CREATININE, CKTOTAL, CKMB, TROPONINIHS in the last 72 hours.  Estimated Creatinine Clearance: 38.5 mL/min (by C-G formula based on SCr of 0.86 mg/dL).   Medical History: Past Medical History:  Diagnosis Date  . FHx: chemotherapy 2008&2011   4 cycles cisplatin,taxotere,2008& carboplatin and Alimta 2011  . History of migraine headaches   . Hypercholesterolemia   . lung ca dx'd 06/2006   rt and lt lung  . Lung cancer ([place]) 3/14/1   right- Adenocarcinoma w/bronchioalveolar features  . Radiation 11/01/15-11/14/15   right upper lobe 30 gray  . Radiation pneumonitis ([place]) 01/24/2016   Assessment: Pharmacy consulted to dose/monitor heparin on this 85 year old female with complaints of arm pain. Heparin initiated for ischemic limb prior to transfer to [facility]. Confirmed with RN that pt not taking any anticoagulants PTA.   Baseline labs ordered STAT: CBC, aPTT, protime/INR  Goal of Therapy:  Heparin level 0.3-0.7 units/ml Monitor platelets by anticoagulation protocol: Yes   Plan:  Give 1500 units bolus x 1 Start heparin infusion at 750 units/hr Check anti-Xa level in 8 hours and daily while on heparin Continue to monitor H&H and platelets  Lenis Noon, PharmD 03/12/2019,8:35 PM

## 2019-03-12 NOTE — ED Triage Notes (Signed)
Pt says around 4pm she was moving some furniture and felt onset of numbness in her right arm. Seen at [facility] prior and sent here for further eval for her right arm is cool to touch, decreased cap refill, mild swelling and numbness in extremity.

## 2019-03-12 NOTE — ED Provider Notes (Addendum)
[name]   CSN: [id number] Arrival date & time: 03/12/19  1943     History   Chief Complaint Chief Complaint  Patient presents with  . Arm Pain    HPI [name] is a 85 y.o. female.     Patient is an 85 year old female with past medical history of lung cancer treated with multiple rounds of chemotherapy and radiation in the past.  She presents today with complaints of pain in her right arm.  This began while she was moving books in a closet at home.  Patient originally went to her primary doctor who then sent her here for further evaluation.  She apparently had decreased capillary refill and the hand felt cool to the touch.  The history is provided by the patient.  Arm Pain This is a new problem. Episode onset: 4 PM today. The problem occurs constantly. The problem has been gradually worsening. Pertinent negatives include no chest pain and no shortness of breath. Nothing aggravates the symptoms. Nothing relieves the symptoms. She has tried nothing for the symptoms.    Past Medical History:  Diagnosis Date  . FHx: chemotherapy 2008&2011   4 cycles cisplatin,taxotere,2008& carboplatin and Alimta 2011  . History of migraine headaches   . Hypercholesterolemia   . lung ca dx'd 06/2006   rt and lt lung  . Lung cancer ([place]) 3/14/1   right- Adenocarcinoma w/bronchioalveolar features  . Radiation 11/01/15-11/14/15   right upper lobe 30 gray  . Radiation pneumonitis ([place]) 01/24/2016    Patient Active Problem List   Diagnosis Date Noted  . Port-A-Cath in place 12/10/2017  . Goals of care, counseling/discussion 09/10/2017  . Encounter for antineoplastic immunotherapy 09/10/2017  . Bronchitis 08/16/2016  . Radiation pneumonitis ([place]) 01/24/2016  . Encounter for antineoplastic chemotherapy 12/14/2014  . Bilateral lung cancer ([place]) 04/08/2011    Past Surgical History:  Procedure Laterality Date  . CATARACT EXTRACTION  2013    bilateral  . left  lowerlung lobectomy Left 06/19/2006   Dr.Burney,Left lower lobectomy  . Porta-cath  2011     OB History   No obstetric history on file.      Home Medications    Prior to Admission medications   Medication Sig Start Date End Date Taking? Authorizing Provider  albuterol (PROVENTIL HFA;VENTOLIN HFA) 108 (90 Base) MCG/ACT inhaler Inhale 1-2 puffs into the lungs every 6 (six) hours as needed for wheezing or shortness of breath. Patient not taking: Reported on 04/28/2018 11/12/17   Maryanna Shape, NP  chlorpheniramine-HYDROcodone (TUSSIONEX) 10-8 MG/5ML SUER Take 5 mLs by mouth 2 (two) times daily as needed for cough. 02/19/19   Heilingoetter, Cassandra L, [name]  Multiple Vitamins-Minerals (CENTRUM SILVER PO) Take 1 tablet by mouth daily. Reported on 12/13/2015    [provider]  vitamin C (ASCORBIC ACID) 500 MG tablet Take 500 mg by mouth daily.    [provider]    Family History No family history on file.  Social History Social History   Tobacco Use  . Smoking status: Never Smoker  . Smokeless tobacco: Never Used  Substance Use Topics  . Alcohol use: No  . Drug use: No     Allergies   Simvastatin   Review of Systems Review of Systems  Respiratory: Negative for shortness of breath.   Cardiovascular: Negative for chest pain.  All other systems reviewed and are negative.    Physical Exam Updated Vital Signs BP (!) 167/145  Temp 97.6 F (36.4 C)   Resp (!) 24   SpO2 100%   Physical Exam Vitals signs and nursing [name] reviewed.  Constitutional:      General: She is not in acute distress.    Appearance: She is well-developed. She is not diaphoretic.  HENT:     Head: Normocephalic and atraumatic.  Neck:     Musculoskeletal: Normal range of motion and neck supple.  Cardiovascular:     Rate and Rhythm: Tachycardia present. Rhythm irregular.     Heart sounds: No murmur. No friction rub. No gallop.   Pulmonary:      Effort: Pulmonary effort is normal. No respiratory distress.     Breath sounds: Normal breath sounds. No wheezing.  Abdominal:     General: Bowel sounds are normal. There is no distension.     Palpations: Abdomen is soft.     Tenderness: There is no abdominal tenderness.  Musculoskeletal: Normal range of motion.     Comments: The right forearm and hand appear tender to the touch.  Capillary refill is decreased.  There are no palpable ulnar or radial pulses.  Skin:    General: Skin is warm and dry.  Neurological:     Mental Status: She is alert and oriented to person, place, and time.      ED Treatments / Results  Labs (all labs ordered are listed, but only abnormal results are displayed) Labs Reviewed - No data to display  EKG EKG Interpretation  Date/Time:  Friday March 12 2019 19:58:59 EDT Ventricular Rate:  155 PR Interval:    QRS Duration: 86 QT Interval:  291 QTC Calculation: 468 R Axis:   -26 Text Interpretation:  Atrial fibrillation with rapid V-rate Borderline left axis deviation Low voltage, precordial leads ST depression, probably rate related Confirmed by Veryl Speak [phone]) on 03/12/2019 8:11:51 PM   Radiology No results found.  Procedures Procedures (including critical care time)  Medications Ordered in ED Medications - No data to display   Initial Impression / Assessment and Plan / ED Course  I have reviewed the triage vital signs and the nursing notes.  Pertinent labs & imaging results that were available during my care of the patient were reviewed by me and considered in my medical decision making (see chart for details).  Patient sent by her primary doctor for evaluation of a pulseless and cool right arm.  I am unable to palpate any ulnar or radial pulses, nor am I able to Doppler any radial or ulnar pulses to the right arm.  The hand and forearm are cool to the touch with poor capillary refill.  Patient appears to have an ischemic limb and will  require emergent vascular surgery evaluation.  I have spoken with Dr. Oneida Alar who would like the patient transferred to [facility] so that he can evaluate and possibly intervene.  I have spoken with Dr. Regenia Skeeter who agrees to accept in transfer.  Patient started on heparin.  She was also found to be in atrial fibrillation with rapid ventricular response.  Patient will not be given any medications at this time as I feel as though lowering her blood pressure could be counterproductive.  CRITICAL CARE Performed by: Veryl Speak Total critical care time: 35 minutes Critical care time was exclusive of separately billable procedures and treating other patients. Critical care was necessary to treat or prevent imminent or life-threatening deterioration. Critical care was time spent personally by me on the following activities: development of treatment  plan with patient and/or surrogate as well as nursing, discussions with consultants, evaluation of patient's response to treatment, examination of patient, obtaining history from patient or surrogate, ordering and performing treatments and interventions, ordering and review of laboratory studies, ordering and review of radiographic studies, pulse oximetry and re-evaluation of patient's condition.   Final Clinical Impressions(s) / ED Diagnoses   Final diagnoses:  None    ED Discharge Orders    None       Veryl Speak, MD 03/12/19 1290    Veryl Speak, MD 03/12/19 2028

## 2019-03-12 NOTE — ED Notes (Signed)
Unable to palpate pulse in right wrist. Unable to find pulse with doppler at bedside.
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
## 2019-03-12 NOTE — ED Notes (Signed)
Spoke to [facility] ED charge RN and made her aware patient is coming via [facility] or EMS

## 2019-03-12 NOTE — ED Notes (Signed)
ED Provider at bedside. 

## 2019-03-12 NOTE — H&P (Signed)
Referring Physician: [place]  Patient name: [name] MRN: [id number] DOB: Nov 09, 1932 Sex: female  REASON FOR CONSULT: right brachial embolus  HPI: [name] is a 85 y.o. female, with a 6 hour history of intermittent numbness coolness tingling in her right hand.  This was sudden onset.  She went to the [facility] long emergency room.  She was noted to have no Doppler signals in the right hand.  She was also noted to be in rapid A. fib.  She has no prior history of atrial fibrillation.  She was started on a heparin drip and transferred to [facility].  She states the numbness and tingling has improved.  The color has also improved.  The numbness comes and goes currently.  She has no symptoms of stroke.  She has no abdominal pain.  She has no symptoms in her left arm or either leg.  She has no chest pain.  Other medical problems include history of lung cancer.  She is currently receiving chemotherapy for this.  Past Medical History:  Diagnosis Date  . FHx: chemotherapy 2008&2011   4 cycles cisplatin,taxotere,2008& carboplatin and Alimta 2011  . History of migraine headaches   . Hypercholesterolemia   . lung ca dx'd 06/2006   rt and lt lung  . Lung cancer ([place]) 3/14/1   right- Adenocarcinoma w/bronchioalveolar features  . Radiation 11/01/15-11/14/15   right upper lobe 30 gray  . Radiation pneumonitis ([place]) 01/24/2016   Past Surgical History:  Procedure Laterality Date  . CATARACT EXTRACTION  2013   bilateral  . left  lowerlung lobectomy Left 06/19/2006   Dr.Burney,Left lower lobectomy  . Porta-cath  2011    No family history on file.  SOCIAL HISTORY: Social History   Socioeconomic History  . Marital status: Widowed    Spouse name: Not on file  . Number of children: 2  . Years of education: Not on file  . Highest education level: Not on file  Occupational History  . Not on file  Social Needs  . Financial resource strain: Not on file  . Food insecurity    Worry: Not  on file    Inability: Not on file  . Transportation needs    Medical: Not on file    Non-medical: Not on file  Tobacco Use  . Smoking status: Never Smoker  . Smokeless tobacco: Never Used  Substance and Sexual Activity  . Alcohol use: No  . Drug use: No  . Sexual activity: Not Currently  Lifestyle  . Physical activity    Days per week: Not on file    Minutes per session: Not on file  . Stress: Not on file  Relationships  . Social Herbalist on phone: Not on file    Gets together: Not on file    Attends religious service: Not on file    Active member of club or organization: Not on file    Attends meetings of clubs or organizations: Not on file    Relationship status: Not on file  . Intimate partner violence    Fear of current or ex partner: Not on file    Emotionally abused: Not on file    Physically abused: Not on file    Forced sexual activity: Not on file  Other Topics Concern  . Not on file  Social History Narrative  . Not on file    Allergies  Allergen Reactions  . Simvastatin Other (See Comments)  Cluster migraines    Current Facility-Administered Medications  Medication Dose Route Frequency Provider Last Rate Last Dose  . ceFAZolin (ANCEF) IVPB 2g/100 mL premix  2 g Intravenous On Call Elam Dutch, MD      . dextrose 5 %-0.45 % sodium chloride infusion   Intravenous Continuous Basil Blakesley, Jessy Oto, MD      . diltiazem (CARDIZEM) 1 mg/mL load via infusion 10 mg  10 mg Intravenous Once Elam Dutch, MD      . diltiazem (CARDIZEM) 125 mg in dextrose 5% 125 mL (1 mg/mL) infusion  5-15 mg/hr Intravenous Continuous Odelia Graciano E, MD      . heparin ADULT infusion 100 units/mL (25000 units/285m sodium chloride 0.45%)  750 Units/hr Intravenous Continuous SLenis Noon RPH 7.5 mL/hr at 03/12/19 2034 750 Units/hr at 03/12/19 2034  . ondansetron (ZOFRAN) injection 4 mg  4 mg Intravenous Q6H PRN FElam Dutch MD      . pantoprazole  (PROTONIX) EC tablet 40 mg  40 mg Oral Daily Arianis Bowditch E, MD      . potassium chloride SA (KLOR-CON) CR tablet 20-40 mEq  20-40 mEq Oral Once FElam Dutch MD       Current Outpatient Medications  Medication Sig Dispense Refill  . albuterol (PROVENTIL HFA;VENTOLIN HFA) 108 (90 Base) MCG/ACT inhaler Inhale 1-2 puffs into the lungs every 6 (six) hours as needed for wheezing or shortness of breath. (Patient not taking: Reported on 04/28/2018) 1 Inhaler 2  . chlorpheniramine-HYDROcodone (TUSSIONEX) 10-8 MG/5ML SUER Take 5 mLs by mouth 2 (two) times daily as needed for cough. 140 mL 0  . Multiple Vitamins-Minerals (CENTRUM SILVER PO) Take 1 tablet by mouth daily. Reported on 12/13/2015    . vitamin C (ASCORBIC ACID) 500 MG tablet Take 500 mg by mouth daily.      ROS:   General:  No weight loss, Fever, chills  HEENT: No recent headaches, no nasal bleeding, no visual changes, no sore throat  Neurologic: No dizziness, blackouts, seizures. No recent symptoms of stroke or mini- stroke. No recent episodes of slurred speech, or temporary blindness.  Cardiac: No recent episodes of chest pain/pressure, no shortness of breath at rest.  No shortness of breath with exertion.  Denies history of atrial fibrillation or irregular heartbeat  Vascular: No history of rest pain in feet.  No history of claudication.  No history of non-healing ulcer, No history of DVT   Pulmonary: No home oxygen, + productive cough, no hemoptysis,  No asthma or wheezing  Musculoskeletal:  _0  Arthritis, _1  Low back pain,  _2  Joint pain  Hematologic:No history of hypercoagulable state.  No history of easy bleeding.  No history of anemia  Gastrointestinal: No hematochezia or melena,  No gastroesophageal reflux, no trouble swallowing  Urinary: _3  chronic Kidney disease, _4  on HD - _5  MWF or _6  TTHS, _7  Burning with urination, _8  Frequent urination, _9  Difficulty urinating;   Skin: No rashes  Psychological: No  history of anxiety,  No history of depression   Physical Examination  Vitals:   03/12/19 2023 03/12/19 2024 03/12/19 2026 03/12/19 2029  BP:      Pulse: (!) 159     Resp: (!) 29  (!) 28 (!) 27  Temp:      SpO2: 100%     Weight:  50.8 kg      Body mass index is 20.49 kg/m.  General:  Alert  and oriented, no acute distress, thin and frail-appearing HEENT: Normal Neck: No JVD Cardiac: Regular Rate and Rhythm tacky heart rate 170s Abdomen: Soft, non-tender, non-distended Skin: No rash Extremity Pulses:  2+ left radial, brachial absent right brachial radial ulnar right hand slightly cooler than left, femoral, dorsalis pedis pulses bilaterally Musculoskeletal: No deformity or edema  Neurologic: Upper and lower extremity motor 5/5 and symmetric  DATA:  COVID negative  CBC    Component Value Date/Time   WBC 7.0 03/12/2019 2016   RBC 4.54 03/12/2019 2016   HGB 14.3 03/12/2019 2016   HGB 13.8 03/03/2019 1015   HGB 12.1 05/28/2017 1015   HCT 44.4 03/12/2019 2016   HCT 37.4 05/28/2017 1015   PLT 286 03/12/2019 2016   PLT 245 03/03/2019 1015   PLT 280 05/28/2017 1015   MCV 97.8 03/12/2019 2016   MCV 101.1 (H) 05/28/2017 1015   MCH 31.5 03/12/2019 2016   MCHC 32.2 03/12/2019 2016   RDW 13.7 03/12/2019 2016   RDW 15.0 (H) 05/28/2017 1015   LYMPHSABS 1.6 03/03/2019 1015   LYMPHSABS 1.6 05/28/2017 1015   MONOABS 0.5 03/03/2019 1015   MONOABS 0.4 05/28/2017 1015   EOSABS 0.0 03/03/2019 1015   EOSABS 0.1 05/28/2017 1015   BASOSABS 0.0 03/03/2019 1015   BASOSABS 0.0 05/28/2017 1015    BMET    Component Value Date/Time   NA 136 03/12/2019 2016   NA 142 05/28/2017 1015   K 4.2 03/12/2019 2016   K 4.2 05/28/2017 1015   CL 103 03/12/2019 2016   CL 108 (H) 11/04/2012 0947   CO2 21 (L) 03/12/2019 2016   CO2 23 05/28/2017 1015   GLUCOSE 124 (H) 03/12/2019 2016   GLUCOSE 67 (L) 05/28/2017 1015   GLUCOSE 104 (H) 11/04/2012 0947   BUN 19 03/12/2019 2016   BUN 14.2  05/28/2017 1015   CREATININE 0.77 03/12/2019 2016   CREATININE 0.86 03/03/2019 1015   CREATININE 0.9 05/28/2017 1015   CALCIUM 9.2 03/12/2019 2016   CALCIUM 9.0 05/28/2017 1015   GFRNONAA >60 03/12/2019 2016   GFRNONAA >60 03/03/2019 1015   GFRAA >60 03/12/2019 2016   GFRAA >60 03/03/2019 1015      ASSESSMENT: Right brachial embolus secondary to atrial fibrillation, new onset   PLAN: #1 we will start patient on diltiazem drip for rate control  Continue heparin drip and proceed to operating room emergently for right brachial embolectomy  Patient will need cardiology evaluation tomorrow.  We will also order echocardiogram and thyroid function tests  Procedure details risk benefits possible complications were discussed with the patient and her daughter.  We will try to do this local with MAC to avoid putting her to sleep with her underlying lung dysfunction.   Ruta Hinds, MD [facility] [place] [facility] Office: [phone] Pager: [phone]

## 2019-03-12 NOTE — ED Notes (Addendum)
Pt being transported with [facility] at this time. Pt in Afib RVR rates in the 150-160s and BP elevated. Heparin gtt started per order and bolus of 1500 units received. All pts and family questions answered at this time.

## 2019-03-12 NOTE — Anesthesia Preprocedure Evaluation (Addendum)
Anesthesia Evaluation  Patient identified by MRN, date of birth, ID band Patient awake    Reviewed: Allergy & Precautions, NPO status , Patient's Chart, lab work & pertinent test results  History of Anesthesia Complications Negative for: history of anesthetic complications  Airway Mallampati: II  TM Distance: >3 FB Neck ROM: Full    Dental no notable dental hx. (+) Dental Advisory Given   Pulmonary neg pulmonary ROS,  Hx of Lung Ca 2008, s/p radiation and Chemo Tx recurrence 2017    Pulmonary exam normal        Cardiovascular + Peripheral Vascular Disease   Rhythm:Irregular Rate:Tachycardia     Neuro/Psych negative neurological ROS     GI/Hepatic negative GI ROS, Neg liver ROS,   Endo/Other  negative endocrine ROS  Renal/GU negative Renal ROS     Musculoskeletal negative musculoskeletal ROS (+)   Abdominal   Peds  Hematology negative hematology ROS (+)   Anesthesia Other Findings Day of surgery medications reviewed with the patient.  Reproductive/Obstetrics                            Anesthesia Physical Anesthesia Plan  ASA: III and emergent  Anesthesia Plan: MAC   Post-op Pain Management:    Induction: Intravenous  PONV Risk Score and Plan: 3 and Ondansetron, Dexamethasone and Diphenhydramine  Airway Management Planned: Oral ETT  Additional Equipment:   Intra-op Plan:   Post-operative Plan: Extubation in OR  Informed Consent: I have reviewed the patients History and Physical, chart, labs and discussed the procedure including the risks, benefits and alternatives for the proposed anesthesia with the patient or authorized representative who has indicated his/her understanding and acceptance.     Dental advisory given  Plan Discussed with: CRNA, Anesthesiologist and Surgeon  Anesthesia Plan Comments:        Anesthesia Quick Evaluation

## 2019-03-12 NOTE — ED Provider Notes (Signed)
Patient transferred from [facility] long.  Dr. Oneida Alar has seen and will take to the OR.   [name] Gambler, MD 03/12/19 2221

## 2019-03-13 ENCOUNTER — Inpatient Hospital Stay (HOSPITAL_COMMUNITY): Payer: Medicare Other | Admitting: Anesthesiology

## 2019-03-13 ENCOUNTER — Inpatient Hospital Stay (HOSPITAL_COMMUNITY): Payer: Medicare Other

## 2019-03-13 ENCOUNTER — Encounter (HOSPITAL_COMMUNITY)
Admission: EM | Disposition: A | Discharge status: Home / Self Care | Payer: Self-pay | Source: Home / Self Care | Attending: Vascular Surgery

## 2019-03-13 ENCOUNTER — Encounter (HOSPITAL_COMMUNITY): Payer: Self-pay | Admitting: Certified Registered"

## 2019-03-13 DIAGNOSIS — I10 Essential (primary) hypertension: Secondary | ICD-10-CM

## 2019-03-13 DIAGNOSIS — I742 Embolism and thrombosis of arteries of the upper extremities: Principal | ICD-10-CM

## 2019-03-13 DIAGNOSIS — I361 Nonrheumatic tricuspid (valve) insufficiency: Secondary | ICD-10-CM

## 2019-03-13 DIAGNOSIS — I340 Nonrheumatic mitral (valve) insufficiency: Secondary | ICD-10-CM

## 2019-03-13 DIAGNOSIS — I429 Cardiomyopathy, unspecified: Secondary | ICD-10-CM

## 2019-03-13 DIAGNOSIS — I4892 Unspecified atrial flutter: Secondary | ICD-10-CM

## 2019-03-13 HISTORY — PX: EMBOLECTOMY: SHX44

## 2019-03-13 LAB — TROPONIN I (HIGH SENSITIVITY): Troponin I (High Sensitivity): 39 ng/L — ABNORMAL HIGH (ref <18)

## 2019-03-13 LAB — HEPARIN LEVEL (UNFRACTIONATED)
Heparin Unfractionated: 0.46 IU/mL (ref 0.30–0.70)
Heparin Unfractionated: 0.81 IU/mL — ABNORMAL HIGH (ref 0.30–0.70)

## 2019-03-13 LAB — CBC
HCT: 41.5 % (ref 36.0–46.0)
Hemoglobin: 13.8 g/dL (ref 12.0–15.0)
MCH: 32.2 pg (ref 26.0–34.0)
MCHC: 33.3 g/dL (ref 30.0–36.0)
MCV: 96.7 fL (ref 80.0–100.0)
Platelets: 245 10*3/uL (ref 150–400)
RBC: 4.29 MIL/uL (ref 3.87–5.11)
RDW: 13.7 % (ref 11.5–15.5)
WBC: 6.2 10*3/uL (ref 4.0–10.5)
nRBC: 0 % (ref 0.0–0.2)

## 2019-03-13 LAB — TSH: TSH: 3.767 u[IU]/mL (ref 0.350–4.500)

## 2019-03-13 LAB — ECHOCARDIOGRAM COMPLETE: Weight: 1777.79 oz

## 2019-03-13 SURGERY — EMBOLECTOMY
Anesthesia: Monitor Anesthesia Care | Laterality: Right

## 2019-03-13 MED ORDER — CEFAZOLIN SODIUM-DEXTROSE 2-3 GM-%(50ML) IV SOLR
INTRAVENOUS | Status: DC | PRN
  Administered 2019-03-13: 2 g via INTRAVENOUS

## 2019-03-13 MED ORDER — PROPOFOL 500 MG/50ML IV EMUL
INTRAVENOUS | Status: DC | PRN
  Administered 2019-03-13: 75 ug/kg/min via INTRAVENOUS

## 2019-03-13 MED ORDER — HYDROCOD POLST-CPM POLST ER 10-8 MG/5ML PO SUER
5.0000 mL | Freq: Two times a day (BID) | ORAL | Status: DC | PRN
  Administered 2019-03-13 – 2019-03-16 (×6): 5 mL via ORAL
  Filled 2019-03-13 (×6): qty 5

## 2019-03-13 MED ORDER — DOCUSATE SODIUM 100 MG PO CAPS
100.0000 mg | ORAL_CAPSULE | Freq: Every day | ORAL | Status: DC
  Administered 2019-03-15 – 2019-03-16 (×2): 100 mg via ORAL
  Filled 2019-03-13 (×3): qty 1

## 2019-03-13 MED ORDER — ACETAMINOPHEN 325 MG PO TABS: 325.0000 mg | ORAL_TABLET | ORAL | Status: DC | PRN

## 2019-03-13 MED ORDER — PHENOL 1.4 % MT LIQD: 1.0000 | OROMUCOSAL | Status: DC | PRN

## 2019-03-13 MED ORDER — POTASSIUM CHLORIDE CRYS ER 20 MEQ PO TBCR
20.0000 meq | EXTENDED_RELEASE_TABLET | Freq: Every day | ORAL | Status: AC | PRN
  Administered 2019-03-14: 40 meq via ORAL
  Filled 2019-03-13: qty 2

## 2019-03-13 MED ORDER — CEFAZOLIN SODIUM-DEXTROSE 2-4 GM/100ML-% IV SOLN
2.0000 g | Freq: Three times a day (TID) | INTRAVENOUS | Status: AC
  Administered 2019-03-13 (×2): 2 g via INTRAVENOUS
  Filled 2019-03-13 (×3): qty 100

## 2019-03-13 MED ORDER — SODIUM CHLORIDE 0.9 % IV SOLN
INTRAVENOUS | Status: DC | PRN
  Administered 2019-03-13: 500 mL

## 2019-03-13 MED ORDER — AMIODARONE HCL IN DEXTROSE 360-4.14 MG/200ML-% IV SOLN
30.0000 mg/h | INTRAVENOUS | Status: DC
  Administered 2019-03-14 – 2019-03-15 (×3): 30 mg/h via INTRAVENOUS
  Filled 2019-03-13 (×6): qty 200

## 2019-03-13 MED ORDER — ADULT MULTIVITAMIN W/MINERALS CH
1.0000 | ORAL_TABLET | Freq: Every day | ORAL | Status: DC
  Administered 2019-03-14 – 2019-03-16 (×3): 1 via ORAL
  Filled 2019-03-13 (×3): qty 1

## 2019-03-13 MED ORDER — SODIUM CHLORIDE 0.9 % IV SOLN: 500.0000 mL | Freq: Once | INTRAVENOUS | Status: DC | PRN

## 2019-03-13 MED ORDER — VITAMIN C 500 MG PO TABS
500.0000 mg | ORAL_TABLET | Freq: Every day | ORAL | Status: DC
  Administered 2019-03-14 – 2019-03-16 (×3): 500 mg via ORAL
  Filled 2019-03-13 (×3): qty 1

## 2019-03-13 MED ORDER — AMIODARONE HCL IN DEXTROSE 360-4.14 MG/200ML-% IV SOLN
60.0000 mg/h | INTRAVENOUS | Status: DC
  Administered 2019-03-13 (×2): 60 mg/h via INTRAVENOUS
  Filled 2019-03-13: qty 200

## 2019-03-13 MED ORDER — ACETAMINOPHEN 325 MG RE SUPP: 325.0000 mg | RECTAL | Status: DC | PRN

## 2019-03-13 MED ORDER — LACTATED RINGERS IV SOLN
INTRAVENOUS | Status: DC | PRN
  Administered 2019-03-13 (×2): via INTRAVENOUS

## 2019-03-13 MED ORDER — SODIUM CHLORIDE 0.9 % IV SOLN: INTRAVENOUS | Status: DC

## 2019-03-13 MED ORDER — CEFAZOLIN SODIUM 1 G IJ SOLR
INTRAMUSCULAR | Status: AC
  Filled 2019-03-13: qty 20

## 2019-03-13 MED ORDER — ALBUTEROL SULFATE (2.5 MG/3ML) 0.083% IN NEBU: 2.5000 mg | INHALATION_SOLUTION | Freq: Four times a day (QID) | RESPIRATORY_TRACT | Status: DC | PRN

## 2019-03-13 MED ORDER — LIDOCAINE HCL 1 % IJ SOLN
INTRAMUSCULAR | Status: DC | PRN
  Administered 2019-03-13: 30 mL

## 2019-03-13 MED ORDER — AMIODARONE LOAD VIA INFUSION
150.0000 mg | Freq: Once | INTRAVENOUS | Status: AC
  Administered 2019-03-13: 150 mg via INTRAVENOUS
  Filled 2019-03-13: qty 83.34

## 2019-03-13 MED ORDER — OXYCODONE HCL 5 MG PO TABS: 5.0000 mg | ORAL_TABLET | ORAL | Status: DC | PRN

## 2019-03-13 MED ORDER — METOPROLOL TARTRATE 5 MG/5ML IV SOLN: 2.0000 mg | INTRAVENOUS | Status: DC | PRN

## 2019-03-13 MED ORDER — LIDOCAINE 2% (20 MG/ML) 5 ML SYRINGE
INTRAMUSCULAR | Status: AC
  Filled 2019-03-13: qty 5

## 2019-03-13 MED ORDER — CHLORHEXIDINE GLUCONATE CLOTH 2 % EX PADS
6.0000 | MEDICATED_PAD | Freq: Every day | CUTANEOUS | Status: DC
  Administered 2019-03-14 – 2019-03-16 (×3): 6 via TOPICAL

## 2019-03-13 MED ORDER — PROMETHAZINE HCL 25 MG/ML IJ SOLN: 6.2500 mg | INTRAMUSCULAR | Status: DC | PRN

## 2019-03-13 MED ORDER — 0.9 % SODIUM CHLORIDE (POUR BTL) OPTIME
TOPICAL | Status: DC | PRN
  Administered 2019-03-13: 05:00:00 1000 mL

## 2019-03-13 MED ORDER — CEFAZOLIN SODIUM-DEXTROSE 2-4 GM/100ML-% IV SOLN
2.0000 g | Freq: Three times a day (TID) | INTRAVENOUS | Status: DC
  Filled 2019-03-13: qty 100

## 2019-03-13 MED ORDER — FENTANYL CITRATE (PF) 100 MCG/2ML IJ SOLN: 25.0000 ug | INTRAMUSCULAR | Status: DC | PRN

## 2019-03-13 MED ORDER — HYDRALAZINE HCL 20 MG/ML IJ SOLN: 5.0000 mg | INTRAMUSCULAR | Status: DC | PRN

## 2019-03-13 MED ORDER — MORPHINE SULFATE (PF) 2 MG/ML IV SOLN: 2.0000 mg | INTRAVENOUS | Status: DC | PRN

## 2019-03-13 MED ORDER — HEPARIN SODIUM (PORCINE) 1000 UNIT/ML IJ SOLN
INTRAMUSCULAR | Status: DC | PRN
  Administered 2019-03-13: 5000 [IU] via INTRAVENOUS

## 2019-03-13 MED ORDER — LABETALOL HCL 5 MG/ML IV SOLN: 10.0000 mg | INTRAVENOUS | Status: DC | PRN

## 2019-03-13 MED ORDER — PHENYLEPHRINE 40 MCG/ML (10ML) SYRINGE FOR IV PUSH (FOR BLOOD PRESSURE SUPPORT)
PREFILLED_SYRINGE | INTRAVENOUS | Status: DC | PRN
  Administered 2019-03-13 (×2): 80 ug via INTRAVENOUS

## 2019-03-13 MED ORDER — ALUM & MAG HYDROXIDE-SIMETH 200-200-20 MG/5ML PO SUSP: 15.0000 mL | ORAL | Status: DC | PRN

## 2019-03-13 MED ORDER — MAGNESIUM SULFATE 2 GM/50ML IV SOLN: 2.0000 g | Freq: Every day | INTRAVENOUS | Status: DC | PRN

## 2019-03-13 SURGICAL SUPPLY — 56 items
ADH SKN CLS APL DERMABOND .7 (GAUZE/BANDAGES/DRESSINGS) ×1
BANDAGE ESMARK 6X9 LF (GAUZE/BANDAGES/DRESSINGS) IMPLANT
BNDG CMPR 9X6 STRL LF SNTH (GAUZE/BANDAGES/DRESSINGS)
BNDG ESMARK 6X9 LF (GAUZE/BANDAGES/DRESSINGS)
CANISTER SUCT 3000ML PPV (MISCELLANEOUS) ×2 IMPLANT
CATH EMB 3FR 80CM (CATHETERS) ×1 IMPLANT
CATH EMB 4FR 80CM (CATHETERS) ×1 IMPLANT
CATH EMB 5FR 80CM (CATHETERS) IMPLANT
CLIP VESOCCLUDE MED 24/CT (CLIP) ×1 IMPLANT
CLIP VESOCCLUDE SM WIDE 24/CT (CLIP) ×1 IMPLANT
CONT SPEC 4OZ CLIKSEAL STRL BL (MISCELLANEOUS) ×1 IMPLANT
COVER WAND RF STERILE (DRAPES) ×2 IMPLANT
CUFF TOURN SGL QUICK 24 (TOURNIQUET CUFF)
CUFF TOURN SGL QUICK 34 (TOURNIQUET CUFF)
CUFF TOURN SGL QUICK 42 (TOURNIQUET CUFF) IMPLANT
CUFF TRNQT CYL 24X4X16.5-23 (TOURNIQUET CUFF) IMPLANT
CUFF TRNQT CYL 34X4.125X (TOURNIQUET CUFF) IMPLANT
DECANTER SPIKE VIAL GLASS SM (MISCELLANEOUS) ×1 IMPLANT
DERMABOND ADVANCED (GAUZE/BANDAGES/DRESSINGS) ×1
DERMABOND ADVANCED .7 DNX12 (GAUZE/BANDAGES/DRESSINGS) IMPLANT
DRAIN SNY 10X20 3/4 PERF (WOUND CARE) IMPLANT
DRAPE X-RAY CASS 24X20 (DRAPES) IMPLANT
ELECT REM PT RETURN 9FT ADLT (ELECTROSURGICAL) ×2
ELECTRODE REM PT RTRN 9FT ADLT (ELECTROSURGICAL) ×1 IMPLANT
EVACUATOR SILICONE 100CC (DRAIN) IMPLANT
GLOVE BIO SURGEON STRL SZ 6.5 (GLOVE) ×1 IMPLANT
GLOVE BIO SURGEON STRL SZ7.5 (GLOVE) ×3 IMPLANT
GLOVE BIOGEL PI IND STRL 7.0 (GLOVE) IMPLANT
GLOVE BIOGEL PI IND STRL 7.5 (GLOVE) IMPLANT
GLOVE BIOGEL PI INDICATOR 7.0 (GLOVE) ×1
GLOVE BIOGEL PI INDICATOR 7.5 (GLOVE) ×1
GOWN STRL REUS W/ TWL LRG LVL3 (GOWN DISPOSABLE) ×3 IMPLANT
GOWN STRL REUS W/TWL LRG LVL3 (GOWN DISPOSABLE) ×8
KIT BASIN OR (CUSTOM PROCEDURE TRAY) ×2 IMPLANT
KIT TURNOVER KIT B (KITS) ×2 IMPLANT
LOOP VESSEL MINI RED (MISCELLANEOUS) ×1 IMPLANT
NS IRRIG 1000ML POUR BTL (IV SOLUTION) ×4 IMPLANT
PACK PERIPHERAL VASCULAR (CUSTOM PROCEDURE TRAY) ×2 IMPLANT
PAD ARMBOARD 7.5X6 YLW CONV (MISCELLANEOUS) ×4 IMPLANT
SET COLLECT BLD 21X3/4 12 (NEEDLE) IMPLANT
SPONGE SURGIFOAM ABS GEL 100 (HEMOSTASIS) IMPLANT
STAPLER VISISTAT 35W (STAPLE) IMPLANT
STOPCOCK 4 WAY LG BORE MALE ST (IV SETS) IMPLANT
SUT PROLENE 5 0 C 1 24 (SUTURE) ×1 IMPLANT
SUT PROLENE 6 0 CC (SUTURE) ×2 IMPLANT
SUT PROLENE 7 0 BV 1 (SUTURE) ×1 IMPLANT
SUT PROLENE 7 0 BV1 MDA (SUTURE) ×1 IMPLANT
SUT VIC AB 2-0 CTX 36 (SUTURE) ×1 IMPLANT
SUT VIC AB 3-0 SH 27 (SUTURE) ×2
SUT VIC AB 3-0 SH 27X BRD (SUTURE) ×1 IMPLANT
SYR 3ML LL SCALE MARK (SYRINGE) ×3 IMPLANT
TOWEL GREEN STERILE (TOWEL DISPOSABLE) ×2 IMPLANT
TRAY FOLEY MTR SLVR 16FR STAT (SET/KITS/TRAYS/PACK) ×1 IMPLANT
TUBING EXTENTION W/L.L. (IV SETS) IMPLANT
UNDERPAD 30X30 (UNDERPADS AND DIAPERS) ×2 IMPLANT
WATER STERILE IRR 1000ML POUR (IV SOLUTION) ×2 IMPLANT

## 2019-03-13 NOTE — Progress Notes (Signed)
[place] for heparin Indication: Brachial embolus/Afib  Allergies  Allergen Reactions  . Simvastatin Other (See Comments)    Cluster migraines    Patient Measurements: Weight: 111 lb 1.8 oz (50.4 kg) Heparin Dosing Weight = TBW = 50.8 kg  Vital Signs: Temp: 97.4 F (36.3 C) (10/10 0831) Temp Source: Oral (10/10 0831) BP: 118/76 (10/10 0800) Pulse Rate: 83 (10/10 0831)  Labs: Recent Labs    03/12/19 2016 03/13/19 0001 03/13/19 0313  HGB 14.3  --  13.8  HCT 44.4  --  41.5  PLT 286  --  245  APTT 32  --   --   LABPROT 12.8  --   --   INR 1.0  --   --   HEPARINUNFRC  --   --  0.46  CREATININE 0.77  --   --   TROPONINIHS  --  39*  --     Estimated Creatinine Clearance: 41.4 mL/min (by C-G formula based on SCr of 0.77 mg/dL).   Assessment: 85 y.o. female with new onset Afib and brachial embolus for heparin. Patient s/p R brachial ulnar radial artery embolectomy on 10/10 and pharmacy asked to restart heparin.  Heparin level therapeutic at 0.46 this AM on drip rate 750 units/hr. CBC slightly down but stable this AM. Heparin was stopped pre-op this AM at 0546 and restarted at 0802 at the same rate per RN. No overt bleeding or infusion issues noted. Will continue previous drip rate and check 8-hr HL.  Goal of Therapy:  Heparin level 0.3-0.7 units/ml Monitor platelets by anticoagulation protocol: Yes   Plan:  - Continue heparin at 750 units/hr - Check 8-hr heparin level - Daily HL and CBC - Monitor for signs of bleeding  Richardine Service, PharmD PGY1 Pharmacy Resident Phone: [phone] 03/13/2019  8:46 AM  Please check [email or web address] for unit-specific pharmacy phone numbers.

## 2019-03-13 NOTE — Progress Notes (Signed)
Received from ED to Room 4E06  awaiting the OR.alert and Oriented x 4 Oriented to room and call bell system. CHG bath given. CCMD called patient placed on the monitor. Consent signed.

## 2019-03-13 NOTE — Op Note (Addendum)
Procedure: Right brachial ulnar radial artery embolectomy  Preoperative diagnosis: Acute ischemia right hand  Postoperative diagnosis: Same  Anesthesia: Local with IV sedation  Assistant: Luanne Bras, RNFA  Operative findings: #1 embolic material right axillary artery  Specimens: Embolic material  Operative details: After obtaining form consent, patient taken the operating.  The patient was placed in supine position operating table.  After adequate sedation patient's entire right upper extremities prepped and draped in usual sterile fashion.  Next local anesthesia was infiltrated over the right antecubital area.  Incision was made in this location carried down through subcutaneous tissues down the level of brachial artery.  Brachial aponeurosis was incised.  Brachial artery was dissected free circumferentially and small side branches ligated and divided tween silk ties.  Several adjacent small veins were also ligated divided tween silk ties.  Dissection was carried down the level of brachial bifurcation.  The radial and ulnar arteries were dissected free circumferentially and Vesseloops placed around them.  Patient's heparin drip was then discontinued.  She was given a bolus of 5000 units of intravenous heparin.  After 2 minutes circulation time, the brachial artery was incised in a transverse fashion with 11 blade.  There was a small amount of blood within the artery.  It was not pulsatile.  #3 Fogarty catheter was then passed up into the level of the shoulder and slightly central to this.  A large amount of embolic material was removed as well as an arterialized plug.  This was sent to pathology as a specimen.  There was brisk pulsatile inflow and 2- passes were obtained.  #3 Fogarty catheter was then passed in similar fashion down the ulnar and radial arteries.  No significant debris was obtained to clean passes were obtained.  These were each flushed with heparinized saline.  The arteriotomy was  then repaired using interrupted 7-0 Prolene sutures.  Just prior to completion anastomosis it was for blood backbled and thoroughly flushed reanastomosed was secured clamps released was pulsatile flow in the brachial artery immediately.  Patient also had a palpable radial pulse.  She had biphasic radial and ulnar Doppler signals.  Hemostasis was obtained with direct pressure.  Subcutaneous tissues were reapproximated using running 3-0 Vicryl suture.  Skin was closed with a 4-0 Vicryl subcuticular stitch.  Patient tolerated procedure well and there were no complications.  Incident sponge needle counts correct in the case.  Patient taken to recovery in stable condition.  Patient's IV heparin will be resumed at 8 AM today.  Patient's daughter updated by phone.  Ruta Hinds, MD [facility] [place] Office: [phone] Pager: [phone]

## 2019-03-13 NOTE — Progress Notes (Signed)
[place]ayer for heparin Indication: Brachial embolus/Afib  Allergies  Allergen Reactions  . Simvastatin Other (See Comments)    Cluster migraines    Patient Measurements: Weight: 111 lb 1.8 oz (50.4 kg) Heparin Dosing Weight = TBW = 50.8 kg  Vital Signs: Temp: 97.7 F (36.5 C) (10/10 0300) Temp Source: Oral (10/10 0300) BP: 133/86 (10/10 0300) Pulse Rate: 97 (10/10 0300)  Labs: Recent Labs    03/12/19 2016 03/13/19 0001 03/13/19 0313  HGB 14.3  --  13.8  HCT 44.4  --  41.5  PLT 286  --  245  APTT 32  --   --   LABPROT 12.8  --   --   INR 1.0  --   --   HEPARINUNFRC  --   --  0.46  CREATININE 0.77  --   --   TROPONINIHS  --  39*  --     Estimated Creatinine Clearance: 41.4 mL/min (by C-G formula based on SCr of 0.77 mg/dL).   Assessment: 85 y.o. female with new onset Afib and brachial embolus for heparin  Goal of Therapy:  Heparin level 0.3-0.7 units/ml Monitor platelets by anticoagulation protocol: Yes   Plan:  Continue Heparin at current rate   Phillis Knack, PharmD, BCPS 03/13/2019,4:22 AM

## 2019-03-13 NOTE — Consult Note (Addendum)
Cardiology Consultation:   Patient ID: [name] MRN: [id number]; DOB: 08-16-1932  Admit date: 03/12/2019 Date of Consult: 03/13/2019  Primary Care Provider: [name], MD Primary Cardiologist: new - Dr. Bronson Ing Primary Electrophysiologist:  None    Patient Profile:   [name] is a 85 y.o. female with a hx of lung cancer  s/p radiation and chemotherapy, migraines, and HLD who is being seen today for the evaluation of new onset Afib at the request of Dr. Oneida Alar.  History of Present Illness:   Ms. Hogsett presented to [facility] with sudden onset numbness, tingling, and coolness of her right hand. Doppler signal was absent in her right hand. She was also noted to be in Afib. She does not have a prior history of Afib and does not currently follow with cardiology. She was started on heparin drip and transferred to [facility] for vascular surgery consult. VVS performed right brachial ulnar radial artery embolectomy for acute ischemic right hand 03/13/19. Cardiology was consulted for new onset atrial fibrillation. EKG with atrial fibrillation with RVR ventricular rate 155.  On my interview, she had radiation for lung cancer several years ago and is now on maintenance chemo (lung cancer diagnosed ?13 years ago) with port in place. She denies palpitations, rapid heart rate, near/syncope, dizziness, snoring, SOB, DOE, chest pain, and lower extremity swelling. She is generally unaware of her rhythm and tolerating the rhythm well. Echo in the room on my interview - reduced EF by visual estimation. She lives alone, but next door to her daughter. She is independent on ADLs.   Heart Pathway Score:     Past Medical History:  Diagnosis Date  . FHx: chemotherapy 2008&2011   4 cycles cisplatin,taxotere,2008& carboplatin and Alimta 2011  . History of migraine headaches   . Hypercholesterolemia   . lung ca dx'd 06/2006   rt and lt lung  . Lung cancer ([place]) 3/14/1   right- Adenocarcinoma  w/bronchioalveolar features  . Radiation 11/01/15-11/14/15   right upper lobe 30 gray  . Radiation pneumonitis ([place]) 01/24/2016    Past Surgical History:  Procedure Laterality Date  . CATARACT EXTRACTION  2013   bilateral  . left  lowerlung lobectomy Left 06/19/2006   Dr.Burney,Left lower lobectomy  . Porta-cath  2011     Home Medications:  Prior to Admission medications   Medication Sig Start Date End Date Taking? Authorizing Provider  albuterol (PROVENTIL HFA;VENTOLIN HFA) 108 (90 Base) MCG/ACT inhaler Inhale 1-2 puffs into the lungs every 6 (six) hours as needed for wheezing or shortness of breath. 11/12/17  Yes Curcio, Roselie Awkward, NP  chlorpheniramine-HYDROcodone (TUSSIONEX) 10-8 MG/5ML SUER Take 5 mLs by mouth 2 (two) times daily as needed for cough. 02/19/19  Yes Heilingoetter, Cassandra L, [name]  Multiple Vitamins-Minerals (CENTRUM SILVER PO) Take 1 tablet by mouth daily. Reported on 12/13/2015   Yes [provider]  vitamin C (ASCORBIC ACID) 500 MG tablet Take 500 mg by mouth daily.   Yes [provider]    Inpatient Medications: Scheduled Meds: . [START ON 03/14/2019] docusate sodium  100 mg Oral Daily  . multivitamin with minerals  1 tablet Oral Daily  . pantoprazole  40 mg Oral Daily  . potassium chloride  20-40 mEq Oral Once  . vitamin C  500 mg Oral Daily   Continuous Infusions: . sodium chloride    . sodium chloride 10 mL/hr at 03/13/19 0832  .  ceFAZolin (ANCEF) IV    . dextrose 5 % and 0.45%  NaCl 50 mL/hr at 03/12/19 2357  . diltiazem (CARDIZEM) infusion 5 mg/hr (03/13/19 0352)  . heparin 750 Units/hr (03/13/19 0802)  . magnesium sulfate bolus IVPB     PRN Meds: sodium chloride, acetaminophen **OR** acetaminophen, albuterol, alum & mag hydroxide-simeth, chlorpheniramine-HYDROcodone, hydrALAZINE, labetalol, magnesium sulfate bolus IVPB, metoprolol tartrate, morphine injection, ondansetron, oxyCODONE, phenol, potassium chloride  Allergies:     Allergies  Allergen Reactions  . Simvastatin Other (See Comments)    Cluster migraines    Social History:   Social History   Socioeconomic History  . Marital status: Widowed    Spouse name: Not on file  . Number of children: 2  . Years of education: Not on file  . Highest education level: Not on file  Occupational History  . Not on file  Social Needs  . Financial resource strain: Not on file  . Food insecurity    Worry: Not on file    Inability: Not on file  . Transportation needs    Medical: Not on file    Non-medical: Not on file  Tobacco Use  . Smoking status: Never Smoker  . Smokeless tobacco: Never Used  Substance and Sexual Activity  . Alcohol use: No  . Drug use: No  . Sexual activity: Not Currently  Lifestyle  . Physical activity    Days per week: Not on file    Minutes per session: Not on file  . Stress: Not on file  Relationships  . Social Herbalist on phone: Not on file    Gets together: Not on file    Attends religious service: Not on file    Active member of club or organization: Not on file    Attends meetings of clubs or organizations: Not on file    Relationship status: Not on file  . Intimate partner violence    Fear of current or ex partner: Not on file    Emotionally abused: Not on file    Physically abused: Not on file    Forced sexual activity: Not on file  Other Topics Concern  . Not on file  Social History Narrative  . Not on file    Family History:    Family History  Problem Relation Age of Onset  . Hypertension Father      ROS:  Please see the history of present illness.   All other ROS reviewed and negative.     Physical Exam/Data:   Vitals:   03/13/19 0730 03/13/19 0745 03/13/19 0800 03/13/19 0831  BP: 118/81 117/76 118/76   Pulse: 85 81  83  Resp: (!) 21 (!) 23    Temp:  98.3 F (36.8 C)  (!) 97.4 F (36.3 C)  TempSrc:    Oral  SpO2: 100% 100%    Weight:        Intake/Output Summary (Last 24  hours) at 03/13/2019 1248 Last data filed at 03/13/2019 0642 Gross per 24 hour  Intake 1000 ml  Output -  Net 1000 ml   Last 3 Weights 03/13/2019 03/12/2019 03/03/2019  Weight (lbs) 111 lb 1.8 oz 112 lb 112 lb 9.6 oz  Weight (kg) 50.4 kg 50.803 kg 51.075 kg     Body mass index is 20.32 kg/m.  General:  Well nourished, well developed, in no acute distress HEENT: normal Neck: no JVD Vascular: No carotid bruits Cardiac:  Irregular rhythm, normal rate, no murmur Lungs:  clear to auscultation bilaterally, no wheezing, rhonchi or rales - chronic  dry cough  Abd: soft, nontender, no hepatomegaly  Ext: no edema Musculoskeletal:  No deformities, BUE and BLE strength normal and equal Skin: warm and dry  Neuro:  CNs 2-12 intact, no focal abnormalities noted Psych:  Normal affect   EKG:  The EKG was personally reviewed and demonstrates:  atrial fibrillation with RVR ventricular rate 155. Telemetry:  Telemetry was personally reviewed and demonstrates:  Appears to be atrial flutter with ventricular rates in the 90s  Relevant CV Studies:  Echo pending  Laboratory Data:  High Sensitivity Troponin:   Recent Labs  Lab 03/13/19 0001  TROPONINIHS 39*     Chemistry Recent Labs  Lab 03/12/19 2016  NA 136  K 4.2  CL 103  CO2 21*  GLUCOSE 124*  BUN 19  CREATININE 0.77  CALCIUM 9.2  GFRNONAA >60  GFRAA >60  ANIONGAP 12    No results for input(s): PROT, ALBUMIN, AST, ALT, ALKPHOS, BILITOT in the last 168 hours. Hematology Recent Labs  Lab 03/12/19 2016 03/13/19 0313  WBC 7.0 6.2  RBC 4.54 4.29  HGB 14.3 13.8  HCT 44.4 41.5  MCV 97.8 96.7  MCH 31.5 32.2  MCHC 32.2 33.3  RDW 13.7 13.7  PLT 286 245   BNPNo results for input(s): BNP, PROBNP in the last 168 hours.  DDimer No results for input(s): DDIMER in the last 168 hours.   Radiology/Studies:  No results found.  Assessment and Plan:   1. New onset atrial flutter with RVR - cardizem drip running at 5 mg/hr -  titration may be limited by BP, she does not have a history of HTN - heparin drip started 03/11/19 PM - rates currently in the 90s - pending echo read, favor restoration of NSR - will continue rate control with anticoagulation for a CHA2DS2-VASc Score and unadjusted Ischemic Stroke Rate (% per year) is equal to 3.2 % stroke rate/year from a score of 46 (age, female) - plan for TEE/DCCV Monday - amiodarone isn't favored given her lung cancer - TSH WNL - K and Mg pending today, K 4.2 yesterday - troponin mildly elevated: 39, likely demand ischemia in the setting of acute ischemic limb and RVR - will continue to trend   2. Lung cancer s/p LLL - undergoing maintenance chemotherapy -s/p radiation many years ago   3. Reduced EF by visual estimation - pending echo final read - euvolemic on exam - no prior echo for comparison      For questions or updates, please contact [place] Please consult [email or web address] for contact info under     Signed, Ledora Bottcher, PA  03/13/2019 12:48 PM   The patient was seen and examined, and I agree with the history, physical exam, assessment and plan as documented above, with modifications as noted below. I have also personally reviewed all relevant documentation, old records, labs, and both radiographic and cardiovascular studies. I have also independently interpreted old and new ECG's.  Briefly, this is an 85 year old woman with a history of lung cancer status post lobectomy, chemotherapy, and radiation.  Yesterday, she underwent right brachial, ulnar, and radial artery embolectomy for acute ischemia in the right hand.  She has no prior history of palpitations.  She has chronic exertional dyspnea.  She denies exertional chest pain.  She was started on IV heparin and IV diltiazem with fairly good control of her heart rates.  Upon telemetry, she appears to have rapid atrial flutter with variable conduction.  Heart rates are now fluctuating  in the 90-low 100 bpm range.  Her daughter, [name], was in the room.  She lives next door to her.  I reviewed the echocardiogram which demonstrates moderately reduced LV systolic function, EF 35 to 40%, with global hypokinesis.  For this reason I will stop diltiazem and start IV amiodarone for rate control.  I would not plan on using amiodarone long-term given her history of lung cancer with lobectomy and radiation therapy.  Given the fact that she is not experiencing palpitations, it is unclear to me how long she is actually been in atrial fibrillation.  We will arrange for TEE/DCCV on Monday.  Would plan to use metoprolol succinate afterwards for heart rate control.  She will also need a DOAC for systemic anticoagulation.  I discussed all of this with the patient and her daughter and they are in agreement with this plan.  Kate Sable, MD, [facility]  03/13/2019 2:15 PM

## 2019-03-13 NOTE — Progress Notes (Signed)
Per Dr. Oneida Alar restart heparin gtt at 0800 03/13/19.

## 2019-03-13 NOTE — Anesthesia Procedure Notes (Signed)
Procedure Name: MAC Date/Time: 03/13/2019 5:02 AM Performed by: Claris Che, CRNA Pre-anesthesia Checklist: Patient identified, Emergency Drugs available, Suction available, Patient being monitored and Timeout performed Patient Re-evaluated:Patient Re-evaluated prior to induction Oxygen Delivery Method: Simple face mask

## 2019-03-13 NOTE — Progress Notes (Signed)
[place] for heparin Indication: Brachial embolus/Afib  Allergies  Allergen Reactions  . Simvastatin Other (See Comments)    Cluster migraines    Patient Measurements: Weight: 111 lb 1.8 oz (50.4 kg) Heparin Dosing Weight =  50.8 kg  Vital Signs: Temp: 98.4 F (36.9 C) (10/10 1331) Temp Source: Oral (10/10 1331) BP: 138/91 (10/10 1331) Pulse Rate: 44 (10/10 1331)  Labs: Recent Labs    03/12/19 2016 03/13/19 0001 03/13/19 0313 03/13/19 1613  HGB 14.3  --  13.8  --   HCT 44.4  --  41.5  --   PLT 286  --  245  --   APTT 32  --   --   --   LABPROT 12.8  --   --   --   INR 1.0  --   --   --   HEPARINUNFRC  --   --  0.46 0.81*  CREATININE 0.77  --   --   --   TROPONINIHS  --  39*  --   --     Estimated Creatinine Clearance: 41.4 mL/min (by C-G formula based on SCr of 0.77 mg/dL).   Assessment: 85 y.o. female with new onset Afib and brachial embolus. Patient is s/p R brachial ulnar radial artery embolectomy on 10/10 and pharmacy asked to restart heparin.  Heparin level is supra-therapeutic at 0.81 units/mL.  RN reports that lab was drawn on the opposite arm as the heparin infusion and Op note stated patient received 5000 units bolus of IV heparin.  Previous heparin level was therapeutic, so the bolus likely contributed to the elevated heparin level.  No bleeding per RN.  Goal of Therapy:  Heparin level 0.3-0.7 units/ml Monitor platelets by anticoagulation protocol: Yes   Plan:  Reduce heparin gtt to 700 units/hr Check 8 hr heparin level  Tavaughn Silguero D. Mina Marble, PharmD, BCPS, [place] 03/13/2019, 5:24 PM

## 2019-03-13 NOTE — Progress Notes (Signed)
Patient taken to OR via bed accompanied by nurse and tech. CCMD called to notify patient off floor in OR.

## 2019-03-13 NOTE — Transfer of Care (Signed)
Immediate Anesthesia Transfer of Care Note  Patient: [name]  Procedure(s) Performed: RIGHT BRACHIAL, RADIAL, ULNAR EMBOLECTOMY (Right )  Patient Location: PACU  Anesthesia Type:MAC  Level of Consciousness: drowsy  Airway & Oxygen Therapy: Patient Spontanous Breathing and Patient connected to face mask oxygen  Post-op Assessment: Report given to RN and Post -op Vital signs reviewed and stable  Post vital signs: Reviewed and stable  Last Vitals:  Vitals Value Taken Time  BP    Temp    Pulse 97 03/13/19 0640  Resp 23 03/13/19 0640  SpO2 100 % 03/13/19 0640  Vitals shown include unvalidated device data.  Last Pain:  Vitals:   03/13/19 0300  TempSrc: Oral  PainSc:          Complications: No apparent anesthesia complications

## 2019-03-13 NOTE — Progress Notes (Signed)
Echocardiogram 2D Echocardiogram has been performed.  Oneal Deputy Jayla Mackie 03/13/2019, 1:04 PM

## 2019-03-14 ENCOUNTER — Encounter (HOSPITAL_COMMUNITY): Payer: Self-pay | Admitting: Vascular Surgery

## 2019-03-14 DIAGNOSIS — C3492 Malignant neoplasm of unspecified part of left bronchus or lung: Secondary | ICD-10-CM

## 2019-03-14 LAB — CBC
HCT: 36.8 % (ref 36.0–46.0)
HCT: 38.4 % (ref 36.0–46.0)
Hemoglobin: 12.5 g/dL (ref 12.0–15.0)
Hemoglobin: 12.9 g/dL (ref 12.0–15.0)
MCH: 32.5 pg (ref 26.0–34.0)
MCH: 31.7 pg (ref 26.0–34.0)
MCHC: 34 g/dL (ref 30.0–36.0)
MCHC: 33.6 g/dL (ref 30.0–36.0)
MCV: 95.6 fL (ref 80.0–100.0)
MCV: 94.3 fL (ref 80.0–100.0)
Platelets: 218 10*3/uL (ref 150–400)
Platelets: 225 10*3/uL (ref 150–400)
RBC: 3.85 MIL/uL — ABNORMAL LOW (ref 3.87–5.11)
RBC: 4.07 MIL/uL (ref 3.87–5.11)
RDW: 13.8 % (ref 11.5–15.5)
RDW: 13.7 % (ref 11.5–15.5)
WBC: 5.5 10*3/uL (ref 4.0–10.5)
WBC: 5.3 10*3/uL (ref 4.0–10.5)
nRBC: 0 % (ref 0.0–0.2)
nRBC: 0 % (ref 0.0–0.2)

## 2019-03-14 LAB — BASIC METABOLIC PANEL
Anion gap: 9 (ref 5–15)
BUN: 6 mg/dL — ABNORMAL LOW (ref 8–23)
CO2: 24 mmol/L (ref 22–32)
Calcium: 8.1 mg/dL — ABNORMAL LOW (ref 8.9–10.3)
Chloride: 102 mmol/L (ref 98–111)
Creatinine, Ser: 0.62 mg/dL (ref 0.44–1.00)
GFR calc Af Amer: >60 mL/min (ref >60)
GFR calc non Af Amer: >60 mL/min (ref >60)
Glucose, Bld: 117 mg/dL — ABNORMAL HIGH (ref 70–99)
Potassium: 3.3 mmol/L — ABNORMAL LOW (ref 3.5–5.1)
Sodium: 135 mmol/L (ref 135–145)

## 2019-03-14 LAB — HEPARIN LEVEL (UNFRACTIONATED)
Heparin Unfractionated: 1.08 IU/mL — ABNORMAL HIGH (ref 0.30–0.70)
Heparin Unfractionated: 0.25 IU/mL — ABNORMAL LOW (ref 0.30–0.70)
Heparin Unfractionated: 0.22 IU/mL — ABNORMAL LOW (ref 0.30–0.70)

## 2019-03-14 MED ORDER — HEPARIN (PORCINE) 25000 UT/250ML-% IV SOLN
800.0000 [IU]/h | INTRAVENOUS | Status: DC
  Administered 2019-03-14: 500 [IU]/h via INTRAVENOUS
  Administered 2019-03-15: 750 [IU]/h via INTRAVENOUS
  Filled 2019-03-14: qty 250

## 2019-03-14 MED ORDER — METOPROLOL TARTRATE 25 MG PO TABS
25.0000 mg | ORAL_TABLET | Freq: Two times a day (BID) | ORAL | Status: AC
  Administered 2019-03-14 – 2019-03-15 (×4): 25 mg via ORAL
  Filled 2019-03-14: qty 1
  Filled 2019-03-14: qty 2
  Filled 2019-03-14 (×2): qty 1

## 2019-03-14 NOTE — Progress Notes (Signed)
[facility] for Heparin Indication: Brachial embolus/Afib  Allergies  Allergen Reactions  . Simvastatin Other (See Comments)    Cluster migraines    Patient Measurements: Weight: 111 lb 1.8 oz (50.4 kg) Heparin Dosing Weight =  50.8 kg  Vital Signs: Temp: 98.2 F (36.8 C) (10/11 2018) Temp Source: Oral (10/11 2018) BP: 126/99 (10/11 2018) Pulse Rate: 85 (10/11 2018)  Labs: Recent Labs    03/12/19 2016 03/13/19 0001 03/13/19 0313  03/14/19 0100 03/14/19 0728 03/14/19 1117 03/14/19 1921  HGB 14.3  --  13.8  --  12.5 12.9  --   --   HCT 44.4  --  41.5  --  36.8 38.4  --   --   PLT 286  --  245  --  218 225  --   --   APTT 32  --   --   --   --   --   --   --   LABPROT 12.8  --   --   --   --   --   --   --   INR 1.0  --   --   --   --   --   --   --   HEPARINUNFRC  --   --  0.46   < > 1.08*  --  0.25* 0.22*  CREATININE 0.77  --   --   --  0.62  --   --   --   TROPONINIHS  --  39*  --   --   --   --   --   --    < > = values in this interval not displayed.    Estimated Creatinine Clearance: 41.4 mL/min (by C-G formula based on SCr of 0.62 mg/dL).   Assessment: 85 y.o. female with new onset Afib and brachial embolus. Patient is s/p R brachial ulnar radial artery embolectomy on 10/10 and pharmacy asked to restart heparin.  Heparin level subtherapeutic at 0.22 despite rate increase earlier today.  Patient currently on 600 units/hr.  Previously heparin level elevated on 700 units/hr.  Spoke to RN, pt had IV come out this evening, unsure of how long it was out for, or exact time of incident.  Goal of Therapy:  Heparin level 0.3-0.7 units/ml Monitor platelets by anticoagulation protocol: Yes   Plan:  Continue IV heparin at 600 units/hr Recheck heparin level with AM labs. Daily HL and CBC Monitor for s/sx bleeding  Marguerite Olea, [facility] Clinical Pharmacist Phone [phone]  03/14/2019 8:54 PM  Please check  [email or web address] for unit-specific pharmacy phone numbers.

## 2019-03-14 NOTE — Progress Notes (Signed)
[facility]  Subjective  - hand feels good   Objective 131/90 (!) 117 97.9 F (36.6 C) (Oral) 18 99%  Intake/Output Summary (Last 24 hours) at 03/14/2019 1112 Last data filed at 03/14/2019 0851 Gross per 24 hour  Intake 1224.16 ml  Output -  Net 1224.16 ml   2+ radial pulse Arm incision healing  Assessment/Planning: S/p right arm embolectomy easily palpable pulse  Afib per cards scheduled for cardioversion tomorrow per cards   Ruta Hinds 03/14/2019 11:12 AM --  Laboratory Lab Results: Recent Labs    03/14/19 0100 03/14/19 0728  WBC 5.5 5.3  HGB 12.5 12.9  HCT 36.8 38.4  PLT 218 225   BMET Recent Labs    03/12/19 2016 03/14/19 0100  NA 136 135  K 4.2 3.3*  CL 103 102  CO2 21* 24  GLUCOSE 124* 117*  BUN 19 6*  CREATININE 0.77 0.62  CALCIUM 9.2 8.1*    COAG Lab Results  Component Value Date   INR 1.0 03/12/2019   INR 0.91 08/14/2009   No results found for: PTT

## 2019-03-14 NOTE — Progress Notes (Signed)
Progress Note  Patient Name: [name] Date of Encounter: 03/14/2019  Primary Cardiologist: Bronson Ing ([place])  Subjective   Denies chest pain, palpitations, leg swelling, and shortness of breath. Sitting up in chair comfortably.  Inpatient Medications    Scheduled Meds: . Chlorhexidine Gluconate Cloth  6 each Topical Daily  . docusate sodium  100 mg Oral Daily  . metoprolol tartrate  25 mg Oral BID  . multivitamin with minerals  1 tablet Oral Daily  . pantoprazole  40 mg Oral Daily  . potassium chloride  20-40 mEq Oral Once  . vitamin C  500 mg Oral Daily   Continuous Infusions: . sodium chloride    . sodium chloride 10 mL/hr at 03/13/19 0832  . amiodarone 30 mg/hr (03/14/19 0352)  . dextrose 5 % and 0.45% NaCl 50 mL/hr at 03/12/19 2357  . heparin 500 Units/hr (03/14/19 0350)  . magnesium sulfate bolus IVPB     PRN Meds: sodium chloride, acetaminophen **OR** acetaminophen, albuterol, alum & mag hydroxide-simeth, chlorpheniramine-HYDROcodone, hydrALAZINE, labetalol, magnesium sulfate bolus IVPB, metoprolol tartrate, morphine injection, ondansetron, oxyCODONE, phenol, potassium chloride   Vital Signs    Vitals:   03/13/19 1947 03/13/19 2355 03/14/19 0357 03/14/19 0852  BP: (!) 142/96 (!) 132/96 (!) 126/96 131/90  Pulse: 100 100 89 (!) 117  Resp: 19 18 (!) 25 18  Temp: 98.1 F (36.7 C) 98 F (36.7 C) 97.9 F (36.6 C) 97.9 F (36.6 C)  TempSrc: Oral Oral Oral Oral  SpO2: 99% 100% 97% 99%  Weight:        Intake/Output Summary (Last 24 hours) at 03/14/2019 1048 Last data filed at 03/14/2019 0851 Gross per 24 hour  Intake 1224.16 ml  Output -  Net 1224.16 ml   Filed Weights   03/12/19 2024 03/13/19 0044  Weight: 50.8 kg 50.4 kg    Telemetry    Rapid atrial flutter - Personally Reviewed   Physical Exam   GEN: No acute distress.   Neck: No JVD Cardiac: Tachycardic, irregular, no murmurs, rubs, or gallops.  Respiratory: Diminished breath sounds  at left base, otherwise clear. GI: Soft, nontender, non-distended  MS: No edema; No deformity. Neuro:  Nonfocal  Psych: Normal affect   Labs    Chemistry Recent Labs  Lab 03/12/19 2016 03/14/19 0100  NA 136 135  K 4.2 3.3*  CL 103 102  CO2 21* 24  GLUCOSE 124* 117*  BUN 19 6*  CREATININE 0.77 0.62  CALCIUM 9.2 8.1*  GFRNONAA >60 >60  GFRAA >60 >60  ANIONGAP 12 9     Hematology Recent Labs  Lab 03/13/19 0313 03/14/19 0100 03/14/19 0728  WBC 6.2 5.5 5.3  RBC 4.29 3.85* 4.07  HGB 13.8 12.5 12.9  HCT 41.5 36.8 38.4  MCV 96.7 95.6 94.3  MCH 32.2 32.5 31.7  MCHC 33.3 34.0 33.6  RDW 13.7 13.8 13.7  PLT 245 218 225    Cardiac EnzymesNo results for input(s): TROPONINI in the last 168 hours. No results for input(s): TROPIPOC in the last 168 hours.   BNPNo results for input(s): BNP, PROBNP in the last 168 hours.   DDimer No results for input(s): DDIMER in the last 168 hours.   Radiology    No results found.  Cardiac Studies   Echocardiogram 03/13/2019:   1. Left ventricular ejection fraction, by visual estimation, is 35 to 40% with diffuse hypokinesis. The left ventricle has moderately decreased function. Normal left ventricular size. There is no left ventricular hypertrophy.  2. Elevated left atrial and left ventricular end-diastolic pressures.  3. Left ventricular diastolic Doppler parameters are consistent with pseudonormalization pattern of LV diastolic filling.  4. Global right ventricle has normal systolic function.The right ventricular size is normal. No increase in right ventricular wall thickness.  5. Left atrial size was mildly dilated.  6. Right atrial size was normal.  7. The mitral valve is normal in structure. Mild mitral valve regurgitation. No evidence of mitral stenosis.  8. The tricuspid valve is normal in structure. Tricuspid valve regurgitation is mild.  9. The aortic valve is normal in structure. Aortic valve regurgitation was not visualized  by color flow Doppler. Mild to moderate aortic valve sclerosis/calcification without any evidence of aortic stenosis. 10. The pulmonic valve was normal in structure. Pulmonic valve regurgitation is mild by color flow Doppler. 11. Normal pulmonary artery systolic pressure. 12. The inferior vena cava is normal in size with greater than 50% respiratory variability, suggesting right atrial pressure of 3 mmHg.  Patient Profile     85 y.o. female with a hx of lung cancer  s/p radiation and chemotherapy, migraines, and HLD who is being seen today for the evaluation of new onset Afib at the request of Dr. Oneida Alar.  Assessment & Plan    1.  Rapid atrial flutter: Currently on IV amiodarone and remains tachycardic.  Given her LV dysfunction will try to avoid calcium channel blockers.  I will start metoprolol tartrate 25 mg twice daily.  She has been scheduled for TEE/DCCV on Monday.  Continue IV heparin.  Would plan to use metoprolol succinate afterwards for heart rate control.  She will also need a DOAC for systemic anticoagulation.  2.  Status post right brachial, ulnar and radial artery embolectomy for acute ischemia of the right hand.  3.  Cardiomyopathy: Given the fact that she is completely unaware of her atrial fibrillation, this may be tachycardia mediated.  Would plan to use metoprolol succinate following TEE/DCCV.  4.  Lung cancer status post left lobectomy: She is undergoing maintenance chemotherapy and follows with Dr. Earlie Server.   For questions or updates, please contact [place] Please consult [email or web address] for contact info under Cardiology/STEMI.      Signed, Kate Sable, MD  03/14/2019, 10:48 AM

## 2019-03-14 NOTE — Progress Notes (Signed)
[place] for Heparin Indication: Brachial embolus/Afib  Allergies  Allergen Reactions  . Simvastatin Other (See Comments)    Cluster migraines    Patient Measurements: Weight: 111 lb 1.8 oz (50.4 kg) Heparin Dosing Weight =  50.8 kg  Vital Signs: Temp: 98 F (36.7 C) (10/10 2355) Temp Source: Oral (10/10 2355) BP: 132/96 (10/10 2355) Pulse Rate: 100 (10/10 2355)  Labs: Recent Labs    03/12/19 2016 03/13/19 0001 03/13/19 0313 03/13/19 1613 03/14/19 0100  HGB 14.3  --  13.8  --  12.5  HCT 44.4  --  41.5  --  36.8  PLT 286  --  245  --  218  APTT 32  --   --   --   --   LABPROT 12.8  --   --   --   --   INR 1.0  --   --   --   --   HEPARINUNFRC  --   --  0.46 0.81* 1.08*  CREATININE 0.77  --   --   --  0.62  TROPONINIHS  --  39*  --   --   --     Estimated Creatinine Clearance: 41.4 mL/min (by C-G formula based on SCr of 0.62 mg/dL).   Assessment: 85 y.o. female with new onset Afib and brachial embolus. Patient is s/p R brachial ulnar radial artery embolectomy on 10/10 and pharmacy asked to restart heparin.  Heparin level is supra-therapeutic at 0.81 units/mL.  RN reports that lab was drawn on the opposite arm as the heparin infusion and Op note stated patient received 5000 units bolus of IV heparin.  Previous heparin level was therapeutic, so the bolus likely contributed to the elevated heparin level.  No bleeding per RN.  10/11 AM update: Heparin level remains elevated, no issues per RN, CBC ok  Goal of Therapy:  Heparin level 0.3-0.7 units/ml Monitor platelets by anticoagulation protocol: Yes   Plan:  Hold heparin x 1 hr Re-start heparin drip at 500 units/hr at 0345 Re-check heparin level in 8 hours  Narda Bonds, PharmD, [place] Pharmacist Phone: [phone]

## 2019-03-14 NOTE — Anesthesia Postprocedure Evaluation (Signed)
Anesthesia Post Note  Patient: [name]  Procedure(s) Performed: RIGHT BRACHIAL, RADIAL, ULNAR EMBOLECTOMY (Right )     Patient location during evaluation: PACU Anesthesia Type: MAC Level of consciousness: awake and alert Pain management: pain level controlled Vital Signs Assessment: post-procedure vital signs reviewed and stable Respiratory status: spontaneous breathing and respiratory function stable Cardiovascular status: stable Postop Assessment: no apparent nausea or vomiting Anesthetic complications: no                  Chigozie Basaldua DANIEL

## 2019-03-14 NOTE — Progress Notes (Signed)
[place] for Heparin Indication: Brachial embolus/Afib  Allergies  Allergen Reactions  . Simvastatin Other (See Comments)    Cluster migraines    Patient Measurements: Weight: 111 lb 1.8 oz (50.4 kg) Heparin Dosing Weight =  50.8 kg  Vital Signs: Temp: 97.9 F (36.6 C) (10/11 1119) Temp Source: Oral (10/11 1119) BP: 124/103 (10/11 1119) Pulse Rate: 110 (10/11 1117)  Labs: Recent Labs    03/12/19 2016 03/13/19 0001  03/13/19 0313 03/13/19 1613 03/14/19 0100 03/14/19 0728 03/14/19 1117  HGB 14.3  --   --  13.8  --  12.5 12.9  --   HCT 44.4  --   --  41.5  --  36.8 38.4  --   PLT 286  --   --  245  --  218 225  --   APTT 32  --   --   --   --   --   --   --   LABPROT 12.8  --   --   --   --   --   --   --   INR 1.0  --   --   --   --   --   --   --   HEPARINUNFRC  --   --    < > 0.46 0.81* 1.08*  --  0.25*  CREATININE 0.77  --   --   --   --  0.62  --   --   TROPONINIHS  --  39*  --   --   --   --   --   --    < > = values in this interval not displayed.    Estimated Creatinine Clearance: 41.4 mL/min (by C-G formula based on SCr of 0.62 mg/dL).   Assessment: 85 y.o. female with new onset Afib and brachial embolus. Patient is s/p R brachial ulnar radial artery embolectomy on 10/10 and pharmacy asked to restart heparin.  Heparin level subtherapeutic at 0.25 after holding heparin for 1 hour and restarting at 500 units/hr at 0345 on 10/11. CBC WNL and stable. No bleeding or infusion issues per RN. Will increase dose and check 8-hr heparin level.  Goal of Therapy:  Heparin level 0.3-0.7 units/ml Monitor platelets by anticoagulation protocol: Yes   Plan:  Increase heparin IV to 600 units/hr Check 8-hr HL Daily HL and CBC Monitor for s/sx bleeding  Richardine Service, PharmD PGY1 Pharmacy Resident Phone: [phone] 03/14/2019  12:17 PM  Please check [email or web address] for unit-specific pharmacy phone numbers.

## 2019-03-15 ENCOUNTER — Other Ambulatory Visit: Payer: Self-pay

## 2019-03-15 ENCOUNTER — Encounter (HOSPITAL_COMMUNITY)
Admission: EM | Disposition: A | Discharge status: Home / Self Care | Payer: Self-pay | Source: Home / Self Care | Attending: Vascular Surgery

## 2019-03-15 ENCOUNTER — Encounter (HOSPITAL_COMMUNITY): Payer: Self-pay

## 2019-03-15 ENCOUNTER — Inpatient Hospital Stay (HOSPITAL_COMMUNITY): Payer: Medicare Other | Admitting: Certified Registered Nurse Anesthetist

## 2019-03-15 ENCOUNTER — Inpatient Hospital Stay (HOSPITAL_COMMUNITY): Payer: Medicare Other

## 2019-03-15 DIAGNOSIS — I483 Typical atrial flutter: Secondary | ICD-10-CM

## 2019-03-15 DIAGNOSIS — I5020 Unspecified systolic (congestive) heart failure: Secondary | ICD-10-CM

## 2019-03-15 LAB — BASIC METABOLIC PANEL
Anion gap: 11 (ref 5–15)
BUN: 11 mg/dL (ref 8–23)
CO2: 19 mmol/L — ABNORMAL LOW (ref 22–32)
Calcium: 8.5 mg/dL — ABNORMAL LOW (ref 8.9–10.3)
Chloride: 105 mmol/L (ref 98–111)
Creatinine, Ser: 0.71 mg/dL (ref 0.44–1.00)
GFR calc Af Amer: >60 mL/min (ref >60)
GFR calc non Af Amer: >60 mL/min (ref >60)
Glucose, Bld: 118 mg/dL — ABNORMAL HIGH (ref 70–99)
Potassium: 4.1 mmol/L (ref 3.5–5.1)
Sodium: 135 mmol/L (ref 135–145)

## 2019-03-15 LAB — CBC
HCT: 40.5 % (ref 36.0–46.0)
Hemoglobin: 13.4 g/dL (ref 12.0–15.0)
MCH: 31.9 pg (ref 26.0–34.0)
MCHC: 33.1 g/dL (ref 30.0–36.0)
MCV: 96.4 fL (ref 80.0–100.0)
Platelets: 258 10*3/uL (ref 150–400)
RBC: 4.2 MIL/uL (ref 3.87–5.11)
RDW: 13.9 % (ref 11.5–15.5)
WBC: 6.1 10*3/uL (ref 4.0–10.5)
nRBC: 0 % (ref 0.0–0.2)

## 2019-03-15 LAB — HEPARIN LEVEL (UNFRACTIONATED)
Heparin Unfractionated: 0.22 IU/mL — ABNORMAL LOW (ref 0.30–0.70)
Heparin Unfractionated: 0.32 IU/mL (ref 0.30–0.70)

## 2019-03-15 SURGERY — CANCELLED PROCEDURE
Anesthesia: General

## 2019-03-15 MED ORDER — PROPOFOL 500 MG/50ML IV EMUL
INTRAVENOUS | Status: DC | PRN
  Administered 2019-03-15: 100 ug/kg/min via INTRAVENOUS

## 2019-03-15 MED ORDER — LIDOCAINE 2% (20 MG/ML) 5 ML SYRINGE
INTRAMUSCULAR | Status: DC | PRN
  Administered 2019-03-15: 60 mg via INTRAVENOUS

## 2019-03-15 MED ORDER — AMIODARONE HCL 200 MG PO TABS: 200.0000 mg | ORAL_TABLET | Freq: Every day | ORAL | Status: DC

## 2019-03-15 MED ORDER — METOPROLOL SUCCINATE ER 50 MG PO TB24
50.0000 mg | ORAL_TABLET | Freq: Every day | ORAL | Status: DC
  Administered 2019-03-16: 50 mg via ORAL
  Filled 2019-03-15: qty 1

## 2019-03-15 MED ORDER — EPHEDRINE SULFATE-NACL 50-0.9 MG/10ML-% IV SOSY
PREFILLED_SYRINGE | INTRAVENOUS | Status: DC | PRN
  Administered 2019-03-15: 10 mg via INTRAVENOUS
  Administered 2019-03-15: 5 mg via INTRAVENOUS
  Administered 2019-03-15: 10 mg via INTRAVENOUS

## 2019-03-15 MED ORDER — PHENYLEPHRINE 40 MCG/ML (10ML) SYRINGE FOR IV PUSH (FOR BLOOD PRESSURE SUPPORT)
PREFILLED_SYRINGE | INTRAVENOUS | Status: DC | PRN
  Administered 2019-03-15: 80 ug via INTRAVENOUS

## 2019-03-15 MED ORDER — PROPOFOL 10 MG/ML IV BOLUS
INTRAVENOUS | Status: DC | PRN
  Administered 2019-03-15 (×4): 10 mg via INTRAVENOUS

## 2019-03-15 MED ORDER — HEPARIN BOLUS VIA INFUSION
1500.0000 [IU] | Freq: Once | INTRAVENOUS | Status: AC
  Administered 2019-03-15: 1500 [IU] via INTRAVENOUS
  Filled 2019-03-15: qty 1500

## 2019-03-15 MED ORDER — SODIUM CHLORIDE 0.9 % IV SOLN
INTRAVENOUS | Status: DC | PRN
  Administered 2019-03-15: 12:00:00 via INTRAVENOUS

## 2019-03-15 MED ORDER — AMIODARONE HCL 200 MG PO TABS
200.0000 mg | ORAL_TABLET | Freq: Two times a day (BID) | ORAL | Status: DC
  Administered 2019-03-15 – 2019-03-16 (×3): 200 mg via ORAL
  Filled 2019-03-15 (×3): qty 1

## 2019-03-15 MED ORDER — ETOMIDATE 2 MG/ML IV SOLN
INTRAVENOUS | Status: DC | PRN
  Administered 2019-03-15: 2 mg via INTRAVENOUS

## 2019-03-15 NOTE — CV Procedure (Signed)
The procedure was aborted after the patient spontaneously cardioverted to SR while I was trying to pass the probe. Currently in sinus bradycardia 52/BPM with PACs.  We will restart IV amiodarone and continue iv Heparin.  She was given 85 mg of IV propofol by anesthesia staff.   Ena Dawley, MD 03/15/2019

## 2019-03-15 NOTE — Progress Notes (Signed)
While in Recovery, pt woke up and started coughing and converted to A.Fib from sinus bradycardia. Called Dr. Meda Coffee, no answer. EKG paged. Will continue to monitor.

## 2019-03-15 NOTE — Progress Notes (Signed)
Dr. Meda Coffee called back. Made her aware of pt converting to A.Fib while recovery. No new orders given. Okay to return to floor per Dr. Meda Coffee.

## 2019-03-15 NOTE — Progress Notes (Signed)
Spoke to Liberal in cath lab, patient will not be able to get on schedule today for TEE. Will order pt breakfast tray.  Clyde Canterbury, RN

## 2019-03-15 NOTE — Progress Notes (Addendum)
Progress Note  Patient Name: [name] Date of Encounter: 03/15/2019  Primary Cardiologist: Kate Sable, MD   Subjective   Mrs.Henne was examined and evaluated at bedside this AM. She was observed resting comfortably in bed. She states she feels better than yesterday and denies any significant chest pain or palpitations. She mentions dyspnea but states it is chronic due to her hx of lung cancer. Discussed plan for cardioversion today. Mrs.Helin expressed understanding.  Inpatient Medications    Scheduled Meds: . Chlorhexidine Gluconate Cloth  6 each Topical Daily  . docusate sodium  100 mg Oral Daily  . metoprolol tartrate  25 mg Oral BID  . multivitamin with minerals  1 tablet Oral Daily  . pantoprazole  40 mg Oral Daily  . potassium chloride  20-40 mEq Oral Once  . vitamin C  500 mg Oral Daily   Continuous Infusions: . sodium chloride    . sodium chloride 10 mL/hr at 03/13/19 0832  . amiodarone 30 mg/hr (03/15/19 0309)  . dextrose 5 % and 0.45% NaCl 50 mL/hr at 03/12/19 2357  . heparin 750 Units/hr (03/15/19 0607)  . magnesium sulfate bolus IVPB     PRN Meds: sodium chloride, acetaminophen **OR** acetaminophen, albuterol, alum & mag hydroxide-simeth, chlorpheniramine-HYDROcodone, hydrALAZINE, labetalol, magnesium sulfate bolus IVPB, metoprolol tartrate, morphine injection, ondansetron, oxyCODONE, phenol   Vital Signs    Vitals:   03/14/19 2355 03/15/19 0330 03/15/19 0833 03/15/19 0921  BP: (!) 147/91 (!) 152/97 (!) 155/90 (!) 155/90  Pulse: 72 90 70 97  Resp: 20 20 20    Temp: 97.9 F (36.6 C) 97.6 F (36.4 C) 97.9 F (36.6 C)   TempSrc: Oral Oral Oral   SpO2: 94% 94% 97%   Weight:        Intake/Output Summary (Last 24 hours) at 03/15/2019 1041 Last data filed at 03/15/2019 0400 Gross per 24 hour  Intake 2265.47 ml  Output -  Net 2265.47 ml   Filed Weights   03/12/19 2024 03/13/19 0044  Weight: 50.8 kg 50.4 kg    Telemetry     Persistent A.fib/flutter 5-6 hours of RVR from 6am-1pm on 03/14/19 Still in A.fib not HR around 80-100 - Personally Reviewed  ECG    EKG 10/9 A.flutter w/ variable conduction, no ST changes, HR >150  - Personally Reviewed  Physical Exam   Gen: Well-developed, thin-appearing, NAD HEENT: NCAT head, hearing intact, EOMI Neck: supple, ROM intact, no JVD CV: irregularly irregular, S1, S2 normal, No rubs, no murmurs, chemo-port noted Pulm: Anterior wheezing, distant breath sounds esp on LLL, no rales Abd: Soft, BS+, NTND, No rebound, no guarding Extm: ROM intact, Peripheral pulses intact, 1+ peripheral edema Skin: Dry, Warm, normal turgor, surgical site on RUE w/ minimal drainage, erythema, edema  Labs    Chemistry Recent Labs  Lab 03/12/19 2016 03/14/19 0100 03/15/19 0430  NA 136 135 135  K 4.2 3.3* 4.1  CL 103 102 105  CO2 21* 24 19*  GLUCOSE 124* 117* 118*  BUN 19 6* 11  CREATININE 0.77 0.62 0.71  CALCIUM 9.2 8.1* 8.5*  GFRNONAA >60 >60 >60  GFRAA >60 >60 >60  ANIONGAP 12 9 11      Hematology Recent Labs  Lab 03/14/19 0100 03/14/19 0728 03/15/19 0430  WBC 5.5 5.3 6.1  RBC 3.85* 4.07 4.20  HGB 12.5 12.9 13.4  HCT 36.8 38.4 40.5  MCV 95.6 94.3 96.4  MCH 32.5 31.7 31.9  MCHC 34.0 33.6 33.1  RDW 13.8 13.7 13.9  PLT 218 225 258    Cardiac EnzymesNo results for input(s): TROPONINI in the last 168 hours. No results for input(s): TROPIPOC in the last 168 hours.   BNPNo results for input(s): BNP, PROBNP in the last 168 hours.   DDimer No results for input(s): DDIMER in the last 168 hours.   Radiology    No results found.  Cardiac Studies   TTE 03/13/19 IMPRESSIONS  1. Left ventricular ejection fraction, by visual estimation, is 35 to 40% with diffuse hypokinesis. The left ventricle has moderately decreased function. Normal left ventricular size. There is no left ventricular hypertrophy.  2. Elevated left atrial and left ventricular end-diastolic pressures.   3. Left ventricular diastolic Doppler parameters are consistent with pseudonormalization pattern of LV diastolic filling.  4. Global right ventricle has normal systolic function.The right ventricular size is normal. No increase in right ventricular wall thickness.  5. Left atrial size was mildly dilated.  6. Right atrial size was normal.  7. The mitral valve is normal in structure. Mild mitral valve regurgitation. No evidence of mitral stenosis.  8. The tricuspid valve is normal in structure. Tricuspid valve regurgitation is mild.  9. The aortic valve is normal in structure. Aortic valve regurgitation was not visualized by color flow Doppler. Mild to moderate aortic valve sclerosis/calcification without any evidence of aortic stenosis. 10. The pulmonic valve was normal in structure. Pulmonic valve regurgitation is mild by color flow Doppler. 11. Normal pulmonary artery systolic pressure. 12. The inferior vena cava is normal in size with greater than 50% respiratory variability, suggesting right atrial pressure of 3 mmHg.  Patient Profile     85 y.o. female w/ PMH of Lung Ca s/p radiation and chemo and HLD admit for ischemic right hand s/p embolectomy. Cardiology consulted for new onset A.flutter w/ RVR  Assessment & Plan    New Onset A.fib vs A.flutterw/ variable conduction   No prior hx. Unclear duration but found on admission. Asymptomatic. Scheduled for TEE +/- cardioversion this am. HR controlled on IV amio @ HR of 90-100. Currently on heparin gtt - C/w heparin - F/u TEE - If cardioversion successful, can start oral metoprolol after - Will need to continue anticoagulation at discharge - Monitor/replete Mag, K as appropriate  Acute systolic heart failure Echo w/ EF of 35 to 40%. Likely inaccurate insetting of A.fib. No wall motion abnormalities. On chart review, chemo regimen does not include typically cardiotoxic meds. Will need repeat echo and ischemic work-up if HFrEF persistent.  Appear euvolemic at the moement - F/u TEE - If persistent, HFrEF, will need beta-blocker, ARB  Hx of Lung ca On maintenance chemo at home. Likely not cause of reduced cardiac function - F/u outpatient card    For questions or updates, please contact [place] Please consult [email or web address] for contact info under Cardiology/STEMI.      Signed, Gilberto Better, MD PGY-2, [facility] Pager: [phone] 03/15/2019, 10:41 AM    Attending attestation to follow  I have seen and examined the patient along with Gilberto Better, MD.  I have reviewed the chart, notes and new data.  I agree with his note.  Key new complaints: seen both before and after her procedure. She is feeling well and appears unaware of the arrhythmia Key examination changes: irregular rhythm, no signs of hypervolemia Key new findings / data: she converted to NSR during attempted intubation for TEE. ECG shows sinus bradycardia, prominent repolarization changes in the inferior and anterolateral leads and QTc 534 ms,  Several minutes later she converted back to AFlutter with controlled ventricular rate.  PLAN: Consider repeat attempt at cardioversion or RF ablation if still in atrial flutter after 4 weeks of amiodarone therapy.  I suspect there is a good chance she will go back to NSR.  Switch to oral anticoagulant. Add ARB. Switch to metoprolol succinate.  Sanda Klein, MD, [place] [phone] 03/15/2019, 12:54 PM

## 2019-03-15 NOTE — Transfer of Care (Signed)
Immediate Anesthesia Transfer of Care Note  Patient: [name]  Procedure(s) Performed: Aborted TEE CANCELLED PROCEDURE  Patient Location: Endoscopy Unit  Anesthesia Type:MAC  Level of Consciousness: drowsy  Airway & Oxygen Therapy: Patient Spontanous Breathing and Patient connected to nasal cannula oxygen  Post-op Assessment: Report given to RN and Post -op Vital signs reviewed and stable  Post vital signs: Reviewed  Last Vitals:  Vitals Value Taken Time  BP 106/64 03/15/19 1221  Temp    Pulse 52 03/15/19 1224  Resp 10 03/15/19 1224  SpO2 99 % 03/15/19 1224  Vitals shown include unvalidated device data.  Last Pain:  Vitals:   03/15/19 1221  TempSrc:   PainSc: 0-No pain         Complications: No apparent anesthesia complications. Amiodarone gtt to be resumed at previous rate per Dr. Meda Coffee, endo RN in recovery and states she will resume.

## 2019-03-15 NOTE — Progress Notes (Addendum)
[facility]  Subjective  - Doing much better right hand feels much better.   Objective (!) 152/97 90 97.6 F (36.4 C) (Oral) 20 94%  Intake/Output Summary (Last 24 hours) at 03/15/2019 0703 Last data filed at 03/15/2019 0400 Gross per 24 hour  Intake 2385.47 ml  Output -  Net 2385.47 ml    Palpable radials and DP B Right AC incision healing well with minimal ecchymosis Heart Irregular Lungs non labored breathing   Assessment/Planning: POD # 1 Right brachial ulnar radial artery embolectomy On Heparin Afib per cards scheduled for cardioversion today per cards Currently stable from a vascular position.    Roxy Horseman 03/15/2019 7:03 AM --  Madaline Brilliant to start oral anticoagulation Ok for d/c from my standpoint D/c home when Cardiology plan complete  Ruta Hinds, MD [facility]: [phone] Pager: [phone]   Laboratory Lab Results: Recent Labs    03/14/19 0728 03/15/19 0430  WBC 5.3 6.1  HGB 12.9 13.4  HCT 38.4 40.5  PLT 225 258   BMET Recent Labs    03/14/19 0100 03/15/19 0430  NA 135 135  K 3.3* 4.1  CL 102 105  CO2 24 19*  GLUCOSE 117* 118*  BUN 6* 11  CREATININE 0.62 0.71  CALCIUM 8.1* 8.5*    COAG Lab Results  Component Value Date   INR 1.0 03/12/2019   INR 0.91 08/14/2009   No results found for: PTT

## 2019-03-15 NOTE — Anesthesia Preprocedure Evaluation (Signed)
Anesthesia Evaluation  Patient identified by MRN, date of birth, ID band Patient awake    Reviewed: Allergy & Precautions, NPO status , Patient's Chart, lab work & pertinent test results  Airway Mallampati: II  TM Distance: >3 FB Neck ROM: Full    Dental no notable dental hx.    Pulmonary neg pulmonary ROS,  Bilateral lung ca   Pulmonary exam normal breath sounds clear to auscultation       Cardiovascular + dysrhythmias Atrial Fibrillation  Rhythm:Irregular Rate:Normal     Neuro/Psych negative neurological ROS  negative psych ROS   GI/Hepatic negative GI ROS, Neg liver ROS,   Endo/Other  negative endocrine ROS  Renal/GU negative Renal ROS  negative genitourinary   Musculoskeletal negative musculoskeletal ROS (+)   Abdominal   Peds negative pediatric ROS (+)  Hematology negative hematology ROS (+)   Anesthesia Other Findings   Reproductive/Obstetrics negative OB ROS                             Anesthesia Physical Anesthesia Plan  ASA: III  Anesthesia Plan: General   Post-op Pain Management:    Induction: Intravenous  PONV Risk Score and Plan: 0  Airway Management Planned: Mask  Additional Equipment:   Intra-op Plan:   Post-operative Plan:   Informed Consent: I have reviewed the patients History and Physical, chart, labs and discussed the procedure including the risks, benefits and alternatives for the proposed anesthesia with the patient or authorized representative who has indicated his/her understanding and acceptance.     Dental advisory given  Plan Discussed with: CRNA and Surgeon  Anesthesia Plan Comments: (MAC for TEE, GA for cardioversion if ok to proceed)        Anesthesia Quick Evaluation

## 2019-03-15 NOTE — Progress Notes (Signed)
[place] for heparin Indication: Brachial embolus/Afib  Allergies  Allergen Reactions  . Simvastatin Other (See Comments)    Cluster migraines    Patient Measurements: Height: 5\' 2"  (157.5 cm) Weight: 110 lb 3.7 oz (50 kg) IBW/kg (Calculated) : 50.1 Heparin Dosing Weight = TBW = 50.8 kg  Vital Signs: Temp: 98 F (36.7 C) (10/12 1700) Temp Source: Oral (10/12 1700) BP: 122/88 (10/12 1700) Pulse Rate: 100 (10/12 1700)  Labs: Recent Labs    03/12/19 2016 03/13/19 0001  03/14/19 0100 03/14/19 0728  03/14/19 1921 03/15/19 0430 03/15/19 1350  HGB 14.3  --    < > 12.5 12.9  --   --  13.4  --   HCT 44.4  --    < > 36.8 38.4  --   --  40.5  --   PLT 286  --    < > 218 225  --   --  258  --   APTT 32  --   --   --   --   --   --   --   --   LABPROT 12.8  --   --   --   --   --   --   --   --   INR 1.0  --   --   --   --   --   --   --   --   HEPARINUNFRC  --   --    < > 1.08*  --    < > 0.22* 0.22* 0.32  CREATININE 0.77  --   --  0.62  --   --   --  0.71  --   TROPONINIHS  --  39*  --   --   --   --   --   --   --    < > = values in this interval not displayed.    Estimated Creatinine Clearance: 41.3 mL/min (by C-G formula based on SCr of 0.71 mg/dL).   Assessment: 85 y.o. female with new onset a fib and brachial embolus started on IV heparin. Pt was scheduled for TEE/cardioversion today; the procedure was aborted when pt spontaneously converted to SR while cardiology was trying to pass probe.  Heparin level drawn this afternoon (>8 hrs after heparin infusion was increased to 750 units/hr earlier today) was 0.32 units/ml, which is at the low end of the goal range for this patient. CBC WNL.  Per RN, no issues with IV or signs of bleeding observed.   Goal of Therapy:  Heparin level 0.3-0.7 units/ml Monitor platelets by anticoagulation protocol: Yes   Plan:  Increase heparin infusion to 800 units/hr Check heparin level in 8 hours,  then daily Monitor CBC daily Monitor for signs of bleeding  Gillermina Hu, PharmD, BCPS, [facility] Clinical Pharmacist 03/15/2019,5:11 PM

## 2019-03-15 NOTE — Interval H&P Note (Signed)
History and Physical Interval Note:  03/15/2019 10:50 AM  [name]  has presented today for surgery, with the diagnosis of atrial fib.  The various methods of treatment have been discussed with the patient and family. After consideration of risks, benefits and other options for treatment, the patient has consented to  Procedure(s): CARDIOVERSION (N/A) TRANSESOPHAGEAL ECHOCARDIOGRAM (TEE) (N/A) as a surgical intervention.  The patient's history has been reviewed, patient examined, no change in status, stable for surgery.  I have reviewed the patient's chart and labs.  Questions were answered to the patient's satisfaction.     Ena Dawley

## 2019-03-15 NOTE — H&P (View-Only) (Signed)
[facility]  Subjective  - Doing much better right hand feels much better.   Objective (!) 152/97 90 97.6 F (36.4 C) (Oral) 20 94%  Intake/Output Summary (Last 24 hours) at 03/15/2019 0703 Last data filed at 03/15/2019 0400 Gross per 24 hour  Intake 2385.47 ml  Output -  Net 2385.47 ml    Palpable radials and DP B Right AC incision healing well with minimal ecchymosis Heart Irregular Lungs non labored breathing   Assessment/Planning: POD # 1 Right brachial ulnar radial artery embolectomy On Heparin Afib per cards scheduled for cardioversion today per cards Currently stable from a vascular position.    [name] 03/15/2019 7:03 AM --  Laboratory Lab Results: Recent Labs    03/14/19 0728 03/15/19 0430  WBC 5.3 6.1  HGB 12.9 13.4  HCT 38.4 40.5  PLT 225 258   BMET Recent Labs    03/14/19 0100 03/15/19 0430  NA 135 135  K 3.3* 4.1  CL 102 105  CO2 24 19*  GLUCOSE 117* 118*  BUN 6* 11  CREATININE 0.62 0.71  CALCIUM 8.1* 8.5*    COAG Lab Results  Component Value Date   INR 1.0 03/12/2019   INR 0.91 08/14/2009   No results found for: PTT

## 2019-03-15 NOTE — Progress Notes (Signed)
[place] for heparin Indication: Brachial embolus/Afib  Allergies  Allergen Reactions  . Simvastatin Other (See Comments)    Cluster migraines    Patient Measurements: Weight: 111 lb 1.8 oz (50.4 kg) Heparin Dosing Weight = TBW = 50.8 kg  Vital Signs: Temp: 97.6 F (36.4 C) (10/12 0330) Temp Source: Oral (10/12 0330) BP: 152/97 (10/12 0330) Pulse Rate: 90 (10/12 0330)  Labs: Recent Labs    03/12/19 2016 03/13/19 0001 03/13/19 0313  03/14/19 0100 03/14/19 0728 03/14/19 1117 03/14/19 1921 03/15/19 0430  HGB 14.3  --  13.8  --  12.5 12.9  --   --   --   HCT 44.4  --  41.5  --  36.8 38.4  --   --   --   PLT 286  --  245  --  218 225  --   --   --   APTT 32  --   --   --   --   --   --   --   --   LABPROT 12.8  --   --   --   --   --   --   --   --   INR 1.0  --   --   --   --   --   --   --   --   HEPARINUNFRC  --   --  0.46   < > 1.08*  --  0.25* 0.22* 0.22*  CREATININE 0.77  --   --   --  0.62  --   --   --   --   TROPONINIHS  --  39*  --   --   --   --   --   --   --    < > = values in this interval not displayed.    Estimated Creatinine Clearance: 41.4 mL/min (by C-G formula based on SCr of 0.62 mg/dL).   Assessment: 85 y.o. female with new onset Afib and brachial embolus for heparin  Goal of Therapy:  Heparin level 0.3-0.7 units/ml Monitor platelets by anticoagulation protocol: Yes   Plan:  Heparin 1500 units IV bolus, then increase heparin 750 units/hr Check heparin level in 8 hours.    Phillis Knack, PharmD, BCPS 03/15/2019,6:03 AM

## 2019-03-15 NOTE — Progress Notes (Signed)
Pt received from endo. Pt sleepy, but arousable. VSS. Call bell in reach. Bed alarm on. Will continue to monitor.   Arletta Bale, RN

## 2019-03-16 LAB — SURGICAL PATHOLOGY

## 2019-03-16 LAB — CBC
HCT: 38.2 % (ref 36.0–46.0)
Hemoglobin: 12.8 g/dL (ref 12.0–15.0)
MCH: 31.6 pg (ref 26.0–34.0)
MCHC: 33.5 g/dL (ref 30.0–36.0)
MCV: 94.3 fL (ref 80.0–100.0)
Platelets: 229 10*3/uL (ref 150–400)
RBC: 4.05 MIL/uL (ref 3.87–5.11)
RDW: 13.8 % (ref 11.5–15.5)
WBC: 5.1 10*3/uL (ref 4.0–10.5)
nRBC: 0 % (ref 0.0–0.2)

## 2019-03-16 LAB — HEPARIN LEVEL (UNFRACTIONATED): Heparin Unfractionated: 0.6 IU/mL (ref 0.30–0.70)

## 2019-03-16 MED ORDER — APIXABAN 2.5 MG PO TABS: 2.5000 mg | ORAL_TABLET | Freq: Two times a day (BID) | ORAL | 1 refills | Status: DC

## 2019-03-16 MED ORDER — AMIODARONE HCL 200 MG PO TABS: 200.0000 mg | ORAL_TABLET | Freq: Two times a day (BID) | ORAL | 1 refills | Status: DC

## 2019-03-16 MED ORDER — HEPARIN SOD (PORK) LOCK FLUSH 100 UNIT/ML IV SOLN
500.0000 [IU] | INTRAVENOUS | Status: AC | PRN
  Administered 2019-03-16: 500 [IU]
  Filled 2019-03-16: qty 5

## 2019-03-16 MED ORDER — METOPROLOL SUCCINATE ER 50 MG PO TB24: 50.0000 mg | ORAL_TABLET | Freq: Every day | ORAL | 1 refills | Status: DC

## 2019-03-16 MED ORDER — APIXABAN 2.5 MG PO TABS
2.5000 mg | ORAL_TABLET | Freq: Two times a day (BID) | ORAL | Status: DC
  Administered 2019-03-16: 2.5 mg via ORAL
  Filled 2019-03-16: qty 1

## 2019-03-16 NOTE — Progress Notes (Signed)
ANTICOAGULATION CONSULT NOTE - Follow Up Consult  Pharmacy Consult for Heparin Indication:  brachial embolus/Afib  Allergies  Allergen Reactions  . Simvastatin Other (See Comments)    Cluster migraines    Patient Measurements: Height: 5\' 2"  (157.5 cm) Weight: 110 lb 3.7 oz (50 kg) IBW/kg (Calculated) : 50.1 Heparin Dosing Weight: 50 kg  Vital Signs: Temp: 97.8 F (36.6 C) (10/13 0843) Temp Source: Oral (10/13 0843) BP: 128/96 (10/13 0843) Pulse Rate: 100 (10/13 0423)  Labs: Recent Labs    03/14/19 0100 03/14/19 0728  03/15/19 0430 03/15/19 1350 03/16/19 0203  HGB 12.5 12.9  --  13.4  --  12.8  HCT 36.8 38.4  --  40.5  --  38.2  PLT 218 225  --  258  --  229  HEPARINUNFRC 1.08*  --    < > 0.22* 0.32 0.60  CREATININE 0.62  --   --  0.71  --   --    < > = values in this interval not displayed.    Estimated Creatinine Clearance: 41.3 mL/min (by C-G formula based on SCr of 0.71 mg/dL).  Assessment:  Anticoag: Heparin for brachial embolus/Afib (No AC PTA) s/p Right brachial ulnar radial artery embolectomy. - HL 0.6. Hgb 13.4>12.8. Plts 229 WNL  Goal of Therapy:  Heparin level 0.3-0.7 units/ml Monitor platelets by anticoagulation protocol: Yes   Plan:  Cont heparin 800 units/hr Daily CBC/HL Plan DOAC. Asked Case Management for benefits check   Dolan Xia S. Alford [place], PharmD, BCPS Clinical Staff Pharmacist Eilene Ghazi [place] 03/16/2019,9:13 AM

## 2019-03-16 NOTE — Progress Notes (Signed)
[place] for Heparin Indication: Brachial embolus/Afib  Allergies  Allergen Reactions  . Simvastatin Other (See Comments)    Cluster migraines    Patient Measurements: Height: 5\' 2"  (157.5 cm) Weight: 110 lb 3.7 oz (50 kg) IBW/kg (Calculated) : 50.1 Heparin Dosing Weight = TBW = 50.8 kg  Vital Signs: Temp: 98.5 F (36.9 C) (10/13 0108) Temp Source: Oral (10/13 0108) BP: 117/83 (10/13 0108) Pulse Rate: 88 (10/13 0108)  Labs: Recent Labs    03/14/19 0100 03/14/19 0728  03/15/19 0430 03/15/19 1350 03/16/19 0203  HGB 12.5 12.9  --  13.4  --  12.8  HCT 36.8 38.4  --  40.5  --  38.2  PLT 218 225  --  258  --  229  HEPARINUNFRC 1.08*  --    < > 0.22* 0.32 0.60  CREATININE 0.62  --   --  0.71  --   --    < > = values in this interval not displayed.    Estimated Creatinine Clearance: 41.3 mL/min (by C-G formula based on SCr of 0.71 mg/dL).   Assessment: 85 y.o. female with new onset a fib and brachial embolus started on IV heparin. Pt was scheduled for TEE/cardioversion today; the procedure was aborted when pt spontaneously converted to SR while cardiology was trying to pass probe.  Heparin level drawn this afternoon (>8 hrs after heparin infusion was increased to 750 units/hr earlier today) was 0.32 units/ml, which is at the low end of the goal range for this patient. CBC WNL.  Per RN, no issues with IV or signs of bleeding observed.  10/13 AM update: Heparin level now therapeutic x 2  Goal of Therapy:  Heparin level 0.3-0.7 units/ml Monitor platelets by anticoagulation protocol: Yes   Plan:  Cont heparin at 800 units/hr Daily CBC/HL Monitor for signs of bleeding  Narda Bonds, PharmD, BCPS Clinical Pharmacist Phone: [phone]

## 2019-03-16 NOTE — Discharge Instructions (Signed)

## 2019-03-16 NOTE — Progress Notes (Addendum)
Progress Note  Patient Name: [name] Date of Encounter: 03/16/2019  Primary Cardiologist: Kate Sable, MD   Subjective   Mrs.Burich was examined and evaluated at bedside this AM. She was observed resting comfortably in bed. She states she feels the best she's ever felt and is eager to go home. She mentions she has had complete resolution of her symptoms of her right arm and she denies any chest pain, palpitations or dyspnea. Continuing to have frequent coughs but that is known to be chronic. Discussed plan to f/u outpatient after 3 weeks of anticoagulation.  Inpatient Medications    Scheduled Meds:  amiodarone  200 mg Oral BID   [START ON 03/22/2019] amiodarone  200 mg Oral Daily   Chlorhexidine Gluconate Cloth  6 each Topical Daily   docusate sodium  100 mg Oral Daily   metoprolol succinate  50 mg Oral Daily   multivitamin with minerals  1 tablet Oral Daily   pantoprazole  40 mg Oral Daily   potassium chloride  20-40 mEq Oral Once   vitamin C  500 mg Oral Daily   Continuous Infusions:  sodium chloride     sodium chloride 10 mL/hr at 03/13/19 0832   dextrose 5 % and 0.45% NaCl 50 mL/hr at 03/12/19 2357   heparin 800 Units/hr (03/15/19 1724)   magnesium sulfate bolus IVPB     PRN Meds: sodium chloride, acetaminophen **OR** acetaminophen, albuterol, alum & mag hydroxide-simeth, chlorpheniramine-HYDROcodone, hydrALAZINE, labetalol, magnesium sulfate bolus IVPB, metoprolol tartrate, morphine injection, ondansetron, oxyCODONE, phenol   Vital Signs    Vitals:   03/15/19 2028 03/16/19 0108 03/16/19 0423 03/16/19 0843  BP: 109/83 117/83 (!) 131/95 (!) 128/96  Pulse: 90 88 100   Resp: 20 (!) 21 (!) 22   Temp: 97.9 F (36.6 C) 98.5 F (36.9 C) 98.7 F (37.1 C) 97.8 F (36.6 C)  TempSrc: Oral Oral Oral Oral  SpO2: 97% 98% 99%   Weight:      Height:        Intake/Output Summary (Last 24 hours) at 03/16/2019 1003 Last data filed at 03/15/2019  1858 Gross per 24 hour  Intake 747.89 ml  Output 0 ml  Net 747.89 ml   Filed Weights   03/12/19 2024 03/13/19 0044 03/15/19 1117  Weight: 50.8 kg 50.4 kg 50 kg    Telemetry    Persistent A.fib/flutter w/o RVR. HR consistently around 90-110 - Personally Reviewed  ECG    EKG 10/12 A.flutter with variable conduction. Rate controlled. New T wave inversions on lateral, anterior leads, - Personally Reviewed  Physical Exam   Gen: Well-developed, thin-appearing, NAD HEENT: NCAT head, hearing intact, EOMI Neck: supple, ROM intact, no JVD CV: irregularly irregular, S1, S2 normal, No rubs, no murmurs, chemo-port intact Pulm: Wheezing, frequent coughs, diminished breath sounds on LLL Abd: Soft, BS+, NTND, No rebound, no guarding Extm: ROM intact, Peripheral pulses intact, no peripheral edema Skin: Dry, Warm, normal turgor, surgical site on RUE w/ minimal drainage, erythema, edema  Labs    Chemistry Recent Labs  Lab 03/12/19 2016 03/14/19 0100 03/15/19 0430  NA 136 135 135  K 4.2 3.3* 4.1  CL 103 102 105  CO2 21* 24 19*  GLUCOSE 124* 117* 118*  BUN 19 6* 11  CREATININE 0.77 0.62 0.71  CALCIUM 9.2 8.1* 8.5*  GFRNONAA >60 >60 >60  GFRAA >60 >60 >60  ANIONGAP 12 9 11      Hematology Recent Labs  Lab 03/14/19 0728 03/15/19 0430 03/16/19  0203  WBC 5.3 6.1 5.1  RBC 4.07 4.20 4.05  HGB 12.9 13.4 12.8  HCT 38.4 40.5 38.2  MCV 94.3 96.4 94.3  MCH 31.7 31.9 31.6  MCHC 33.6 33.1 33.5  RDW 13.7 13.9 13.8  PLT 225 258 229    Cardiac EnzymesNo results for input(s): TROPONINI in the last 168 hours. No results for input(s): TROPIPOC in the last 168 hours.   BNPNo results for input(s): BNP, PROBNP in the last 168 hours.   DDimer No results for input(s): DDIMER in the last 168 hours.   Radiology    No results found.  Cardiac Studies   TTE 03/13/19 IMPRESSIONS  1. Left ventricular ejection fraction, by visual estimation, is 35 to 40% with diffuse hypokinesis. The  left ventricle has moderately decreased function. Normal left ventricular size. There is no left ventricular hypertrophy.  2. Elevated left atrial and left ventricular end-diastolic pressures.  3. Left ventricular diastolic Doppler parameters are consistent with pseudonormalization pattern of LV diastolic filling.  4. Global right ventricle has normal systolic function.The right ventricular size is normal. No increase in right ventricular wall thickness.  5. Left atrial size was mildly dilated.  6. Right atrial size was normal.  7. The mitral valve is normal in structure. Mild mitral valve regurgitation. No evidence of mitral stenosis.  8. The tricuspid valve is normal in structure. Tricuspid valve regurgitation is mild.  9. The aortic valve is normal in structure. Aortic valve regurgitation was not visualized by color flow Doppler. Mild to moderate aortic valve sclerosis/calcification without any evidence of aortic stenosis. 10. The pulmonic valve was normal in structure. Pulmonic valve regurgitation is mild by color flow Doppler. 11. Normal pulmonary artery systolic pressure. 12. The inferior vena cava is normal in size with greater than 50% respiratory variability, suggesting right atrial pressure of 3 mmHg.  Patient Profile     85 y.o. female w/ PMH of Lung Ca s/p radiation and chemo and HLD admit for ischemic right hand s/p embolectomy. Cardiology consulted for new onset A.flutter w/ RVR  Assessment & Plan    New Onset A.fib vs A.flutterw/ variable conduction   No prior hx. Unclear duration but found on admission. Asymptomatic. Currently on oral amiodarone. Received metoprolol this am. TEE yesterday aborted due to spontaneous conversion to sinus but became a.flutter again shortly after. - C/w heparin - C/w amiodarone, metoprolol - F/u with outpatient cardiology - Switch to Eliquis 2.5mg  BID, d/c heparin gtt - Monitor/replete Mag, K as appropriate  Acute systolic heart failure Echo w/  EF of 35 to 40%. Likely inaccurate insetting of A.fib. No wall motion abnormalities. On chart review, chemo regimen does not include typically cardiotoxic meds. Will need repeat echo and ischemic work-up if HFrEF persistent. - F/u outpatient cardiology in 1 week to assess - Will hold off on starting ARB/ACE-I/Entresto for now as we just started beta-blocker to prevent hypotension.  Hx of Lung ca On maintenance chemo at home. Likely not cause of reduced cardiac function - F/u outpatient oncology  For questions or updates, please contact [place] Please consult [email or web address] for contact info under Cardiology/STEMI.     Signed, Gilberto Better, MD PGY-2, [place] [facility] Pager: [phone] 03/16/2019, 10:03 AM    Attending attestation to follow  I have seen and examined the patient along with Gilberto Better, MD.  I have reviewed the chart, notes and new data.  I agree with his note.  Key new complaints: feels well Key examination changes: remains in  Aflutter - borderline rate control at rest (high 90s), increases to 120s w activity (just received her metoprolol an hour ago) Key new findings / data: telemetry reviewed  Start Eliquis (dose adjusted for age and low body weight), DC heparin. Bring back for DCCV in 3 weeks unless she converts in the meantime. Add ARB as an outpatient if BP allows.   PLAN: [facility] will sign off.   Medication Recommendations:  Amiodarone 200 mg BID until 03/21/2019, then 200 mg daily; metoprolol succinate 50 mg daily; Eliquis 2.5 mg daily Other recommendations (labs, testing, etc):  If she remains on amiodarone long-term will need twice yearly TSH, LFTs. Yearly eye exam. Avoid prolonged sun exposure. Follow up as an outpatient:  Will arrange TOC appt in 1 week.   Sanda Klein, MD, [place] [phone] 03/16/2019, 10:13 AM

## 2019-03-16 NOTE — Care Management Important Message (Signed)
Important Message  Patient Details  Name: [name] MRN: [id number] Date of Birth: 09-18-1932   Medicare Important Message Given:  Yes     Shelda Altes 03/16/2019, 12:53 PM

## 2019-03-16 NOTE — TOC Benefit Eligibility Note (Signed)
Transition of Care [facility]) Benefit Eligibility Note    Patient Details  Name: [name] MRN: [id number] Date of Birth: 09/27/1932   Medication/Dose: Arne Cleveland   5 MG BID  AND ELIQUIS  2.5 M GBID  Covered?: Yes  Tier: 2 Drug  Prescription Coverage [facility]  Spoke with Person/Company/Phone Number:: UGAYGEF  @  [employer]  #  [phone]  Co-Pay: $35.00  Prior Approval: No  Deductible: Met  Additional Notes: XARELTO   20 MG DAILY, COVER- YES, CO-PAY- $ 35.00, TIER- 2 DRUG, P/A -NO    Memory Argue Phone Number: 03/16/2019, 11:38 AM

## 2019-03-16 NOTE — Anesthesia Postprocedure Evaluation (Signed)
Anesthesia Post Note  Patient: [name]  Procedure(s) Performed: Aborted TEE CANCELLED PROCEDURE     Patient location during evaluation: PACU Anesthesia Type: MAC Level of consciousness: awake and alert Pain management: pain level controlled Vital Signs Assessment: post-procedure vital signs reviewed and stable Respiratory status: spontaneous breathing, nonlabored ventilation, respiratory function stable and patient connected to nasal cannula oxygen Cardiovascular status: stable and blood pressure returned to baseline Postop Assessment: no apparent nausea or vomiting Anesthetic complications: no    Last Vitals:  Vitals:   03/16/19 0108 03/16/19 0423  BP: 117/83 (!) 131/95  Pulse: 88 100  Resp: (!) 21 (!) 22  Temp: 36.9 C 37.1 C  SpO2: 98% 99%    Last Pain:  Vitals:   03/16/19 0423  TempSrc: Oral  PainSc:                  [name] S

## 2019-03-16 NOTE — TOC Transition Note (Signed)
Transition of Care [facility]) - CM/SW Discharge Note Marvetta Gibbons RN, BSN Transitions of Care Unit 4E- RN Case Manager [phone]  Patient Details  Name: [name] MRN: [id number] Date of Birth: Oct 07, 1932  Transition of Care [facility]) CM/SW Contact:  Dawayne Patricia, RN Phone Number: 03/16/2019, 3:12 PM   Clinical Narrative:    Pt stable for transition home today, pt has been started on Eliquis- per benefits check copay cost $35- CM spoke with pt and shared coverage info- also provided pt 30 day free card [facility]'s [facility] on first month.    Final next level of care: Home/Self Care Barriers to Discharge: No Barriers Identified   Patient Goals and CMS Choice Patient states their goals for this hospitalization and ongoing recovery are:: go home   Choice offered to / list presented to : NA  Discharge Placement            Home           Discharge Plan and Services   Discharge Planning Services: CM Consult, Medication Assistance Post Acute Care Choice: NA          DME Arranged: N/A DME Agency: NA       HH Arranged: NA HH Agency: NA        Social Determinants of Health (SDOH) Interventions     Readmission Risk Interventions Readmission Risk Prevention Plan 03/16/2019  Transportation Screening Complete  PCP or Specialist Appt within 5-7 Days Complete  Home Care Screening Complete  Medication Review (RN CM) Complete  Some recent data might be hidden

## 2019-03-16 NOTE — Progress Notes (Addendum)
[facility] [place]  Subjective  - Doing well without new complaints.   Objective (!) 131/95 100 98.7 F (37.1 C) (Oral) (!) 22 99%  Intake/Output Summary (Last 24 hours) at 03/16/2019 0724 Last data filed at 03/15/2019 1858 Gross per 24 hour  Intake 747.89 ml  Output 0 ml  Net 747.89 ml    Palpable radial and DP pulses B Right arm incision healing well with ecchymosis surround intact incision.  Heart Irregular tachy 120's Lungs non labored dry cough  Assessment/Planning: POD # 13 Right brachial ulnar radial artery embolectomy  Pending work up by cardiology she will need to go home on anticoagulation would appreciate Cardiology input. From a vascular point of view she is ready for discharge.  Roxy Horseman 03/16/2019 7:24 AM --  Laboratory Lab Results: Recent Labs    03/15/19 0430 03/16/19 0203  WBC 6.1 5.1  HGB 13.4 12.8  HCT 40.5 38.2  PLT 258 229   BMET Recent Labs    03/14/19 0100 03/15/19 0430  NA 135 135  K 3.3* 4.1  CL 102 105  CO2 24 19*  GLUCOSE 117* 118*  BUN 6* 11  CREATININE 0.62 0.71  CALCIUM 8.1* 8.5*    COAG Lab Results  Component Value Date   INR 1.0 03/12/2019   INR 0.91 08/14/2009   No results found for: PTT   I have seen and evaluated the patient. I agree with the PA note as documented above.  Continues to have palpable right radial pulse after brachial embolectomy with Dr. Oneida Alar over the weekend.  Patient went for TEE and cardioversion yesterday and per notes converted to sinus rhythm.  Will await cardiology input today pending discharge, ok for discharge from our standpoint.  Needs to be transitioned to oral anticoagulant will ask cardiology if they have a preference.  Still on heparin gtt this am.  Marty Heck, MD [facility] [place] Office: [phone] Pager: [phone]

## 2019-03-17 NOTE — Discharge Summary (Signed)
[facility] Discharge Summary   Patient ID:  [name] MRN: [id number] DOB/AGE: 1932/06/15 85 y.o.  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 FHx: chemotherapy 2008&2011   4 cycles cisplatin,taxotere,2008& carboplatin and Alimta 2011  . History of migraine headaches   . Hypercholesterolemia   . lung ca dx'd 06/2006   rt and lt lung  . Lung cancer ([place]) 3/14/1   right- Adenocarcinoma w/bronchioalveolar features  . Radiation 11/01/15-11/14/15   right upper lobe 30 gray  . Radiation pneumonitis ([place]) 01/24/2016    Procedure(s): CANCELLED PROCEDURE  Discharged Condition: stable  HPI: [name] is a 85 y.o. female, with a 6 hour history of intermittent numbness coolness tingling in her right hand.  This was sudden onset.  She went to the [facility] long emergency room.  She was noted to have no Doppler signals in the right hand.  She was also noted to be in rapid A. fib.  She has no prior history of atrial fibrillation.    Cardilology consult called and the patient was taken to      Hospital Course:  [name] is a 85 y.o. female is S/P Procedure(s): Right brachial ulnar radial artery embolectomy  Consults:  Treatment Team:  Lbcardiology, Rounding, MD   New onset Rapid atrial flutter:  She was scheduled for cardioversion.   The procedure was aborted after the patient spontaneously cardioverted to SR while I was trying to pass the  probe.  EKG 10/12 A.flutter with variable conduction. Rate controlled. New T wave inversions on lateral, anterior leads.  Discharge plan per Cardiology: - C/w amiodarone, metoprolol - F/u with outpatient cardiology - Switch to Eliquis 2.5mg  BID  Palpable left radial pulse.  Well healing incision.    Significant Diagnostic Studies: CBC Lab Results  Component Value Date   WBC 5.1 03/16/2019   HGB 12.8 03/16/2019   HCT 38.2 03/16/2019   MCV 94.3 03/16/2019   PLT 229 03/16/2019    BMET    Component Value Date/Time   NA 135 03/15/2019 0430   NA 142 05/28/2017 1015   K 4.1 03/15/2019 0430   K 4.2 05/28/2017 1015   CL 105 03/15/2019 0430   CL 108 (H) 11/04/2012 0947   CO2 19 (L) 03/15/2019 0430   CO2 23 05/28/2017 1015   GLUCOSE 118 (H) 03/15/2019 0430   GLUCOSE 67 (L) 05/28/2017 1015   GLUCOSE 104 (H) 11/04/2012 0947   BUN 11 03/15/2019 0430   BUN 14.2 05/28/2017 1015   CREATININE 0.71 03/15/2019 0430   CREATININE 0.86 03/03/2019 1015   CREATININE 0.9 05/28/2017 1015   CALCIUM 8.5 (L) 03/15/2019 0430   CALCIUM 9.0 05/28/2017 1015   GFRNONAA >60 03/15/2019 0430   GFRNONAA >60 03/03/2019 1015   GFRAA >60 03/15/2019 0430   GFRAA >60 03/03/2019 1015   COAG Lab Results  Component Value Date   INR 1.0 03/12/2019   INR 0.91 08/14/2009     Disposition:  Discharge to :Home Discharge Instructions    Call MD for:  redness, tenderness, or signs of infection (pain, swelling,  bleeding, redness, odor or green/yellow discharge around incision site)   Complete by: As directed    Call MD for:  severe or increased pain, loss or decreased feeling  in affected limb(s)   Complete by: As directed    Call MD for:  temperature >100.5   Complete by: As directed    Discharge instructions   Complete by: As directed    You may shower daily activity as tolerates   Resume previous diet   Complete by: As directed      Allergies as of 03/16/2019      Reactions   Simvastatin Other (See  Comments)   Cluster migraines      Medication List    TAKE these medications   albuterol 108 (90 Base) MCG/ACT inhaler Commonly known as: VENTOLIN HFA Inhale 1-2 puffs into the lungs every 6 (six) hours as needed for wheezing or shortness of breath.   amiodarone 200 MG tablet Commonly known as: PACERONE Take 1 tablet (200 mg total) by mouth 2 (two) times daily. 200 mg BID until10/18/2020, then 200 mg QD.   apixaban 2.5 MG Tabs tablet Commonly known as: ELIQUIS Take 1 tablet (2.5 mg total) by mouth 2 (two) times daily.   CENTRUM SILVER PO Take 1 tablet by mouth daily. Reported on 12/13/2015   chlorpheniramine-HYDROcodone 10-8 MG/5ML Suer Commonly known as: TUSSIONEX Take 5 mLs by mouth 2 (two) times daily as needed for cough.   metoprolol succinate 50 MG 24 hr tablet Commonly known as: TOPROL-XL Take 1 tablet (50 mg total) by mouth daily. Take with or immediately following a meal.  Refills per cardiology or primary care   vitamin C 500 MG tablet Commonly known as: ASCORBIC ACID Take 500 mg by mouth daily.      Verbal and written Discharge instructions given to the patient. Wound care per Discharge AVS Follow-up Information    Elam Dutch, MD Follow up in 2 week(s).   Specialties: Vascular Surgery, Cardiology Contact information: [address] [place] [address] [phone]           Signed: Roxy Horseman 03/18/2019, 10:19 AM

## 2019-03-18 ENCOUNTER — Other Ambulatory Visit: Payer: Self-pay | Admitting: Emergency Medicine

## 2019-03-18 ENCOUNTER — Telehealth: Payer: Self-pay | Admitting: Medical Oncology

## 2019-03-18 DIAGNOSIS — R0602 Shortness of breath: Secondary | ICD-10-CM

## 2019-03-18 NOTE — Telephone Encounter (Signed)
Requests refill for inhaler. "Cough is different-sounds like a gasping cough ,but she is not really anymore SOB." Recent d/c from Hospital for r hand ischemia and embolectomy. New atrial fib. Per Dr Julien Nordmann appt with Lucianne Lei tomorrow. Pt notified of appts and to get CXR first.

## 2019-03-19 ENCOUNTER — Other Ambulatory Visit: Payer: Self-pay

## 2019-03-19 ENCOUNTER — Telehealth: Payer: Self-pay

## 2019-03-19 ENCOUNTER — Inpatient Hospital Stay: Payer: Medicare Other | Attending: Internal Medicine | Admitting: Medical

## 2019-03-19 ENCOUNTER — Inpatient Hospital Stay: Payer: Medicare Other

## 2019-03-19 ENCOUNTER — Ambulatory Visit (HOSPITAL_COMMUNITY)
Admission: RE | Admit: 2019-03-19 | Discharge: 2019-03-19 | Disposition: A | Discharge status: Home / Self Care | Payer: Medicare Other | Source: Ambulatory Visit | Attending: Medical | Admitting: Medical

## 2019-03-19 VITALS — BP 117/60 | HR 53 | Temp 97.8°F | Resp 16 | Wt 113.6 lb

## 2019-03-19 DIAGNOSIS — I4891 Unspecified atrial fibrillation: Secondary | ICD-10-CM | POA: Diagnosis not present

## 2019-03-19 DIAGNOSIS — Z95828 Presence of other vascular implants and grafts: Secondary | ICD-10-CM

## 2019-03-19 DIAGNOSIS — Z20828 Contact with and (suspected) exposure to other viral communicable diseases: Secondary | ICD-10-CM | POA: Insufficient documentation

## 2019-03-19 DIAGNOSIS — Z5112 Encounter for antineoplastic immunotherapy: Secondary | ICD-10-CM | POA: Diagnosis present

## 2019-03-19 DIAGNOSIS — R05 Cough: Secondary | ICD-10-CM | POA: Diagnosis not present

## 2019-03-19 DIAGNOSIS — R059 Cough, unspecified: Secondary | ICD-10-CM

## 2019-03-19 DIAGNOSIS — J90 Pleural effusion, not elsewhere classified: Secondary | ICD-10-CM

## 2019-03-19 DIAGNOSIS — R0602 Shortness of breath: Secondary | ICD-10-CM | POA: Diagnosis not present

## 2019-03-19 DIAGNOSIS — Z9221 Personal history of antineoplastic chemotherapy: Secondary | ICD-10-CM | POA: Diagnosis not present

## 2019-03-19 DIAGNOSIS — Z7901 Long term (current) use of anticoagulants: Secondary | ICD-10-CM | POA: Insufficient documentation

## 2019-03-19 DIAGNOSIS — Z01812 Encounter for preprocedural laboratory examination: Secondary | ICD-10-CM | POA: Insufficient documentation

## 2019-03-19 DIAGNOSIS — E7800 Pure hypercholesterolemia, unspecified: Secondary | ICD-10-CM | POA: Diagnosis not present

## 2019-03-19 DIAGNOSIS — Z923 Personal history of irradiation: Secondary | ICD-10-CM | POA: Insufficient documentation

## 2019-03-19 DIAGNOSIS — C3412 Malignant neoplasm of upper lobe, left bronchus or lung: Secondary | ICD-10-CM | POA: Insufficient documentation

## 2019-03-19 DIAGNOSIS — Z79899 Other long term (current) drug therapy: Secondary | ICD-10-CM | POA: Diagnosis not present

## 2019-03-19 LAB — CMP (CANCER CENTER ONLY)
ALT: 10 U/L (ref 0–44)
AST: 19 U/L (ref 15–41)
Albumin: 3 g/dL — ABNORMAL LOW (ref 3.5–5.0)
Alkaline Phosphatase: 74 U/L (ref 38–126)
Anion gap: 7 (ref 5–15)
BUN: 16 mg/dL (ref 8–23)
CO2: 26 mmol/L (ref 22–32)
Calcium: 8.4 mg/dL — ABNORMAL LOW (ref 8.9–10.3)
Chloride: 105 mmol/L (ref 98–111)
Creatinine: 0.79 mg/dL (ref 0.44–1.00)
GFR, Est AFR Am: >60 mL/min (ref >60)
GFR, Estimated: >60 mL/min (ref >60)
Glucose, Bld: 130 mg/dL — ABNORMAL HIGH (ref 70–99)
Potassium: 4 mmol/L (ref 3.5–5.1)
Sodium: 138 mmol/L (ref 135–145)
Total Bilirubin: 0.6 mg/dL (ref 0.3–1.2)
Total Protein: 6.9 g/dL (ref 6.5–8.1)

## 2019-03-19 LAB — CBC WITH DIFFERENTIAL (CANCER CENTER ONLY)
Abs Immature Granulocytes: 0.01 10*3/uL (ref 0.00–0.07)
Basophils Absolute: 0 10*3/uL (ref 0.0–0.1)
Basophils Relative: 0 %
Eosinophils Absolute: 0.1 10*3/uL (ref 0.0–0.5)
Eosinophils Relative: 1 %
HCT: 40.4 % (ref 36.0–46.0)
Hemoglobin: 13.3 g/dL (ref 12.0–15.0)
Immature Granulocytes: 0 %
Lymphocytes Relative: 27 %
Lymphs Abs: 1.5 10*3/uL (ref 0.7–4.0)
MCH: 31.7 pg (ref 26.0–34.0)
MCHC: 32.9 g/dL (ref 30.0–36.0)
MCV: 96.2 fL (ref 80.0–100.0)
Monocytes Absolute: 0.4 10*3/uL (ref 0.1–1.0)
Monocytes Relative: 7 %
Neutro Abs: 3.6 10*3/uL (ref 1.7–7.7)
Neutrophils Relative %: 65 %
Platelet Count: 311 10*3/uL (ref 150–400)
RBC: 4.2 MIL/uL (ref 3.87–5.11)
RDW: 13.7 % (ref 11.5–15.5)
WBC Count: 5.6 10*3/uL (ref 4.0–10.5)
nRBC: 0 % (ref 0.0–0.2)

## 2019-03-19 LAB — SARS CORONAVIRUS 2 (TAT 6-24 HRS): SARS Coronavirus 2: NEGATIVE

## 2019-03-19 MED ORDER — HEPARIN SOD (PORK) LOCK FLUSH 100 UNIT/ML IV SOLN
500.0000 [IU] | Freq: Once | INTRAVENOUS | Status: DC | PRN
  Filled 2019-03-19: qty 5

## 2019-03-19 MED ORDER — SODIUM CHLORIDE 0.9% FLUSH
10.0000 mL | INTRAVENOUS | Status: DC | PRN
  Administered 2019-03-19: 10 mL
  Filled 2019-03-19: qty 10

## 2019-03-19 NOTE — Telephone Encounter (Signed)
TC to Pt's daughter to make sure she returned to the cancer center to have port de accessed after ultrasound. TC from ultrasound stating they were sending Pt back to cancer center to de access port. Daughter confirmed Pt. Had port needle de accessed.

## 2019-03-22 ENCOUNTER — Other Ambulatory Visit: Payer: Self-pay

## 2019-03-22 ENCOUNTER — Ambulatory Visit (HOSPITAL_COMMUNITY)
Admission: RE | Admit: 2019-03-22 | Discharge: 2019-03-22 | Disposition: A | Discharge status: Home / Self Care | Payer: Medicare Other | Source: Ambulatory Visit | Attending: Medical | Admitting: Medical

## 2019-03-22 ENCOUNTER — Telehealth: Payer: Self-pay | Admitting: Emergency Medicine

## 2019-03-22 ENCOUNTER — Ambulatory Visit (HOSPITAL_COMMUNITY)
Admission: RE | Admit: 2019-03-22 | Discharge: 2019-03-22 | Disposition: A | Discharge status: Home / Self Care | Payer: Medicare Other | Source: Ambulatory Visit | Attending: Student | Admitting: Student

## 2019-03-22 DIAGNOSIS — J90 Pleural effusion, not elsewhere classified: Secondary | ICD-10-CM | POA: Diagnosis not present

## 2019-03-22 DIAGNOSIS — Z9889 Other specified postprocedural states: Secondary | ICD-10-CM

## 2019-03-22 DIAGNOSIS — D199 Benign neoplasm of mesothelial tissue, unspecified: Secondary | ICD-10-CM | POA: Diagnosis not present

## 2019-03-22 DIAGNOSIS — R222 Localized swelling, mass and lump, trunk: Secondary | ICD-10-CM | POA: Insufficient documentation

## 2019-03-22 MED ORDER — LIDOCAINE HCL 1 % IJ SOLN
INTRAMUSCULAR | Status: AC
  Filled 2019-03-22: qty 10

## 2019-03-22 NOTE — Progress Notes (Signed)
[facility] Progress Note   ANGALA HILGERS [id number] 10-28-1932 85 y.o.  Derenda Mis Todorov is managed by Dr. Fanny Bien. Mohamed  Actively treated with chemotherapy/immunotherapy/hormonal therapy: yes  Current therapy: nivolumab  Last treated: 03/03/2019 (cycle 20)  Next scheduled appointment with provider: 03/31/2019  Assessment: Plan:    Port-A-Cath in place - Plan: heparin lock flush 100 unit/mL, sodium chloride flush (NS) 0.9 % injection 10 mL  Pleural effusion - Plan: US Thoracentesis Asp Pleural space w/IMG guide, CANCELED: SARS CORONAVIRUS 2 (TAT 6-24 HRS) Nasopharyngeal Nasopharyngeal Swab, CANCELED: SARS CORONAVIRUS 2 (TAT 6-24 HRS) Nasopharyngeal Nasopharyngeal Swab, CANCELED: Novel Coronavirus, NAA (Hosp order, Send-out to Ref Lab; TAT 18-24 hrs, CANCELED: Novel Coronavirus, NAA (Hosp order, Send-out to Ref Lab; TAT 18-24 hrs  Cough - Plan: SARS CORONAVIRUS 2 (TAT 6-24 HRS) Nasopharyngeal Nasopharyngeal Swab, SARS CORONAVIRUS 2 (TAT 6-24 HRS) Nasopharyngeal Nasopharyngeal Swab, CANCELED: SARS CORONAVIRUS 2 (TAT 6-24 HRS) Nasopharyngeal Nasopharyngeal Swab, CANCELED: SARS CORONAVIRUS 2 (TAT 6-24 HRS) Nasopharyngeal Nasopharyngeal Swab, CANCELED: Novel Coronavirus, NAA (Hosp order, Send-out to Ref Lab; TAT 18-24 hrs, CANCELED: Novel Coronavirus, NAA (Hosp order, Send-out to Ref Lab; TAT 18-24 hrs  Shortness of breath - Plan: SARS CORONAVIRUS 2 (TAT 6-24 HRS) Nasopharyngeal Nasopharyngeal Swab, SARS CORONAVIRUS 2 (TAT 6-24 HRS) Nasopharyngeal Nasopharyngeal Swab, CANCELED: SARS CORONAVIRUS 2 (TAT 6-24 HRS) Nasopharyngeal Nasopharyngeal Swab, CANCELED: SARS CORONAVIRUS 2 (TAT 6-24 HRS) Nasopharyngeal Nasopharyngeal Swab, CANCELED: Novel Coronavirus, NAA (Hosp order, Send-out to Ref Lab; TAT 18-24 hrs, CANCELED: Novel Coronavirus, NAA (Hosp order, Send-out to Ref Lab; TAT 18-24 hrs   Right pleural effusion with shortness of breath and cough: The patient was referred  for chest x-ray today which returned showing:  Right Port-A-Cath in place with the tip at the cavoatrial junction. Large right upper paramediastinal mass again noted as seen on recent chest CT. Moderate right pleural effusion and small left pleural effusion, new since prior CT. Bibasilar atelectasis. Bilateral nodular opacities. No acute bony abnormality.  The patient had COVID-19 testing collected today and is scheduled for a right thoracentesis on Monday.  Please see After Visit Summary for patient specific instructions.  Future Appointments  Date Time Provider [place]  03/31/2019 10:15 AM [facility] 6 [facility] None  03/31/2019 10:30 AM [facility] [place] FLUSH [facility] None  03/31/2019 11:00 AM Curt Bears, MD [facility] None  03/31/2019 11:30 AM [facility] INFUSION [facility] None  03/31/2019 12:00 PM Jennet Maduro, RD [facility] None  04/08/2019  2:45 PM [facility]  04/28/2019  8:30 AM [facility] ONLY [facility] None  04/28/2019  8:45 AM [facility] [place] FLUSH [facility] None  04/28/2019  9:30 AM Curt Bears, MD [facility] None  04/28/2019 10:15 AM [facility] INFUSION [facility] None  05/26/2019  8:30 AM [facility] ONLY [facility] None  05/26/2019  8:45 AM [facility] [place] FLUSH [facility] None  05/26/2019  9:15 AM Curt Bears, MD [facility] None  05/26/2019 10:15 AM [facility] INFUSION [facility] None    Orders Placed This Encounter  Procedures   SARS CORONAVIRUS 2 (TAT 6-24 HRS) Nasopharyngeal Nasopharyngeal Swab   US Thoracentesis Asp Pleural space w/IMG guide       Subjective:   Patient ID:  YAIZA PALAZZOLA is a 85 y.o. (DOB [birth year]) female.  Chief Complaint: No chief complaint on file.   HPI GETSEMANI LINDON  Is a 85 y.o. female with a diagnosis of of a recurrent non-small cell lung cancer, adenocarcinoma who is followed by Dr. Fanny Bien. Mohamed and is treated  with nivolumab.  She received cycle 20 on 03/03/2019.   She presents to the clinic today with short follow-up.  A chest x-ray completed earlier today showed a large right pleural effusion.  The patient denies fevers, chills, sweats,, or acute pain.   Medications: I have reviewed the patient's current medications.  Allergies:  Allergies  Allergen Reactions   Simvastatin Other (See Comments)    Cluster migraines    Past Medical History:  Diagnosis Date   FHx: chemotherapy 2008&2011   4 cycles cisplatin,taxotere,2008& carboplatin and Alimta 2011   History of migraine headaches    Hypercholesterolemia    lung ca dx'd 06/2006   rt and lt lung   Lung cancer ([place]) 3/14/1   right- Adenocarcinoma w/bronchioalveolar features   Radiation 11/01/15-11/14/15   right upper lobe 30 gray   Radiation pneumonitis ([place]) 01/24/2016    Past Surgical History:  Procedure Laterality Date   CATARACT EXTRACTION  2013   bilateral   EMBOLECTOMY Right 03/13/2019   Procedure: RIGHT BRACHIAL, RADIAL, ULNAR EMBOLECTOMY;  Surgeon: Elam Dutch, MD;  Location: [facility] OR;  Service: Vascular;  Laterality: Right;   left  lowerlung lobectomy Left 06/19/2006   Dr.Burney,Left lower lobectomy   Porta-cath  2011    Family History  Problem Relation Age of Onset   Hypertension Father     Social History   Socioeconomic History   Marital status: Widowed    Spouse name: Not on file   Number of children: 2   Years of education: Not on file   Highest education level: Not on file  Occupational History   Not on file  Social Needs   Financial resource strain: Not on file   Food insecurity    Worry: Not on file    Inability: Not on file   Transportation needs    Medical: Not on file    Non-medical: Not on file  Tobacco Use   Smoking status: Never Smoker   Smokeless tobacco: Never Used  Substance and Sexual Activity   Alcohol use: No   Drug use: No   Sexual activity: Not Currently  Lifestyle   Physical activity    Days per week: Not  on file    Minutes per session: Not on file   Stress: Not on file  Relationships   Social connections    Talks on phone: Not on file    Gets together: Not on file    Attends religious service: Not on file    Active member of club or organization: Not on file    Attends meetings of clubs or organizations: Not on file    Relationship status: Not on file   Intimate partner violence    Fear of current or ex partner: Not on file    Emotionally abused: Not on file    Physically abused: Not on file    Forced sexual activity: Not on file  Other Topics Concern   Not on file  Social History Narrative   Not on file    Past Medical History, Surgical history, Social history, and Family history were reviewed and updated as appropriate.   Please see review of systems for further details on the patient's review from today.   Review of Systems:  Review of Systems  Constitutional: Negative for chills, diaphoresis and fever.  HENT: Negative for trouble swallowing.   Respiratory: Positive for cough and shortness of breath. Negative for choking, chest tightness, wheezing and stridor.   Cardiovascular: Negative for chest  pain and palpitations.    Objective:   Physical Exam:  BP 117/60 (BP Location: Left Arm, Patient Position: Sitting)    Pulse (!) 53    Temp 97.8 F (36.6 C) (Temporal)    Resp 16    Wt 113 lb 9 oz (51.5 kg)    SpO2 97%    BMI 20.77 kg/m  ECOG: 1  Physical Exam Constitutional:      General: She is not in acute distress.    Appearance: She is not diaphoretic.  HENT:     Head: Normocephalic and atraumatic.     Right Ear: External ear normal.     Left Ear: External ear normal.     Mouth/Throat:     Pharynx: No oropharyngeal exudate.  Neck:     Musculoskeletal: Normal range of motion and neck supple.  Cardiovascular:     Rate and Rhythm: Normal rate and regular rhythm.     Heart sounds: Normal heart sounds. No murmur. No friction rub. No gallop.   Pulmonary:      Effort: Pulmonary effort is normal. No respiratory distress.     Breath sounds: Examination of the right-middle field reveals decreased breath sounds. Examination of the right-lower field reveals decreased breath sounds. Decreased breath sounds present. No wheezing or rales.  Lymphadenopathy:     Cervical: No cervical adenopathy.  Skin:    General: Skin is warm and dry.     Findings: No erythema or rash.  Neurological:     Mental Status: She is alert.     Coordination: Coordination normal.  Psychiatric:        Behavior: Behavior normal.        Thought Content: Thought content normal.        Judgment: Judgment normal.     Lab Review:     Component Value Date/Time   NA 138 03/19/2019 1203   NA 142 05/28/2017 1015   K 4.0 03/19/2019 1203   K 4.2 05/28/2017 1015   CL 105 03/19/2019 1203   CL 108 (H) 11/04/2012 0947   CO2 26 03/19/2019 1203   CO2 23 05/28/2017 1015   GLUCOSE 130 (H) 03/19/2019 1203   GLUCOSE 67 (L) 05/28/2017 1015   GLUCOSE 104 (H) 11/04/2012 0947   BUN 16 03/19/2019 1203   BUN 14.2 05/28/2017 1015   CREATININE 0.79 03/19/2019 1203   CREATININE 0.9 05/28/2017 1015   CALCIUM 8.4 (L) 03/19/2019 1203   CALCIUM 9.0 05/28/2017 1015   PROT 6.9 03/19/2019 1203   PROT 7.1 05/28/2017 1015   ALBUMIN 3.0 (L) 03/19/2019 1203   ALBUMIN 3.3 (L) 05/28/2017 1015   AST 19 03/19/2019 1203   AST 19 05/28/2017 1015   ALT 10 03/19/2019 1203   ALT 7 05/28/2017 1015   ALKPHOS 74 03/19/2019 1203   ALKPHOS 84 05/28/2017 1015   BILITOT 0.6 03/19/2019 1203   BILITOT 0.54 05/28/2017 1015   GFRNONAA >60 03/19/2019 1203   GFRAA >60 03/19/2019 1203       Component Value Date/Time   WBC 5.6 03/19/2019 1203   WBC 5.1 03/16/2019 0203   RBC 4.20 03/19/2019 1203   HGB 13.3 03/19/2019 1203   HGB 12.1 05/28/2017 1015   HCT 40.4 03/19/2019 1203   HCT 37.4 05/28/2017 1015   PLT 311 03/19/2019 1203   PLT 280 05/28/2017 1015   MCV 96.2 03/19/2019 1203   MCV 101.1 (H) 05/28/2017  1015   MCH 31.7 03/19/2019 1203   MCHC 32.9 03/19/2019 1203  RDW 13.7 03/19/2019 1203   RDW 15.0 (H) 05/28/2017 1015   LYMPHSABS 1.5 03/19/2019 1203   LYMPHSABS 1.6 05/28/2017 1015   MONOABS 0.4 03/19/2019 1203   MONOABS 0.4 05/28/2017 1015   EOSABS 0.1 03/19/2019 1203   EOSABS 0.1 05/28/2017 1015   BASOSABS 0.0 03/19/2019 1203   BASOSABS 0.0 05/28/2017 1015   -------------------------------  Imaging from last 24 hours (if applicable):  Radiology interpretation: Dg Chest 2 View  Result Date: 03/19/2019 CLINICAL DATA:  Shortness of breath, cough EXAM: CHEST - 2 VIEW COMPARISON:  CT 01/04/2019 FINDINGS: Right Port-A-Cath in place with the tip at the cavoatrial junction. Large right upper paramediastinal mass again noted as seen on recent chest CT. Moderate right pleural effusion and small left pleural effusion, new since prior CT. Bibasilar atelectasis. Bilateral nodular opacities. No acute bony abnormality. IMPRESSION: Large right paramediastinal mass and bilateral nodular opacities again noted as seen on recent chest CT. Moderate right pleural effusion and small left pleural effusion with bibasilar atelectasis. Electronically Signed   By: Rolm Baptise M.D.   On: 03/19/2019 11:13   Dg Chest Port 1 View  Result Date: 03/22/2019 CLINICAL DATA:  Status post right thoracentesis. EXAM: PORTABLE CHEST 1 VIEW COMPARISON:  Chest x-ray dated March 19, 2019. FINDINGS: Unchanged right chest wall port catheter. A decreased now small right pleural effusion status post thoracentesis. Unchanged small left pleural effusion. No pneumothorax. Grossly unchanged large right upper paramediastinal mass. Previously noted small nodular opacities in both lungs are not as well visualized on today's study. Normal heart size. No acute osseous abnormality. IMPRESSION: 1. Decreased now small right pleural effusion status post thoracentesis. No pneumothorax. 2. Grossly unchanged large right upper paramediastinal mass.  Electronically Signed   By: Titus Dubin M.D.   On: 03/22/2019 12:03   US Thoracentesis Asp Pleural Space W/img Guide  Result Date: 03/22/2019 INDICATION: Patient with history of lung cancer, right pleural effusion. Request is made for diagnostic and therapeutic thoracentesis. EXAM: ULTRASOUND GUIDED DIAGNOSTIC AND THERAPEUTIC RIGHT THORACENTESIS MEDICATIONS: 10 mL 1% lidocaine COMPLICATIONS: None immediate. PROCEDURE: An ultrasound guided thoracentesis was thoroughly discussed with the patient and questions answered. The benefits, risks, alternatives and complications were also discussed. The patient understands and wishes to proceed with the procedure. Written consent was obtained. Ultrasound was performed to localize and mark an adequate pocket of fluid in the right chest. The area was then prepped and draped in the normal sterile fashion. 1% Lidocaine was used for local anesthesia. Under ultrasound guidance a 6 Fr Safe-T-Centesis catheter was introduced. Thoracentesis was performed. The catheter was removed and a dressing applied. FINDINGS: A total of approximately 300 mL of yellow fluid was removed. Samples were sent to the laboratory as requested by the clinical team. IMPRESSION: Successful ultrasound guided diagnostic and therapeutic right thoracentesis yielding 300 mL of pleural fluid. Read by: Brynda Greathouse [name] Electronically Signed   By: Jacqulynn Cadet M.D.   On: 03/22/2019 13:33        This case was discussed with Dr. Julien Nordmann. He expressed agreement with my management of this patient.

## 2019-03-22 NOTE — Progress Notes (Signed)
These results were called to [place] and were reviewed with her . Her questions were answered. She expressed understanding.

## 2019-03-22 NOTE — Progress Notes (Signed)
These preliminary result these preliminary results were noted.  Awaiting final report.

## 2019-03-22 NOTE — Telephone Encounter (Signed)
Returning pt's VM requesting confirmation of thoracentesis this am at 1100.  Spoke with pt & her daughter Sharyn Lull, confirmed date/time of appt & to arrive 15 min early to [facility] radiology.  Also made them aware of negative Covid-19 test.  Verbalized understanding.

## 2019-03-23 LAB — CYTOLOGY - NON PAP

## 2019-03-23 NOTE — Progress Notes (Signed)
These preliminary result these preliminary results were noted.  Awaiting final report.

## 2019-03-26 ENCOUNTER — Encounter (INDEPENDENT_AMBULATORY_CARE_PROVIDER_SITE_OTHER): Payer: Self-pay

## 2019-03-31 ENCOUNTER — Other Ambulatory Visit: Payer: Self-pay

## 2019-03-31 ENCOUNTER — Telehealth: Payer: Self-pay | Admitting: Internal Medicine

## 2019-03-31 ENCOUNTER — Inpatient Hospital Stay (HOSPITAL_BASED_OUTPATIENT_CLINIC_OR_DEPARTMENT_OTHER): Payer: Medicare Other | Admitting: Internal Medicine

## 2019-03-31 ENCOUNTER — Inpatient Hospital Stay: Payer: Medicare Other

## 2019-03-31 ENCOUNTER — Encounter: Payer: Self-pay | Admitting: Internal Medicine

## 2019-03-31 VITALS — BP 142/68 | HR 49 | Temp 97.8°F | Resp 17 | Ht 62.0 in | Wt 109.6 lb

## 2019-03-31 DIAGNOSIS — C3490 Malignant neoplasm of unspecified part of unspecified bronchus or lung: Secondary | ICD-10-CM

## 2019-03-31 DIAGNOSIS — C3491 Malignant neoplasm of unspecified part of right bronchus or lung: Secondary | ICD-10-CM

## 2019-03-31 DIAGNOSIS — J700 Acute pulmonary manifestations due to radiation: Secondary | ICD-10-CM

## 2019-03-31 DIAGNOSIS — Z5112 Encounter for antineoplastic immunotherapy: Secondary | ICD-10-CM | POA: Diagnosis not present

## 2019-03-31 DIAGNOSIS — R059 Cough, unspecified: Secondary | ICD-10-CM

## 2019-03-31 DIAGNOSIS — R05 Cough: Secondary | ICD-10-CM | POA: Diagnosis not present

## 2019-03-31 DIAGNOSIS — C3492 Malignant neoplasm of unspecified part of left bronchus or lung: Secondary | ICD-10-CM

## 2019-03-31 DIAGNOSIS — Z95828 Presence of other vascular implants and grafts: Secondary | ICD-10-CM

## 2019-03-31 LAB — CBC WITH DIFFERENTIAL (CANCER CENTER ONLY)
Abs Immature Granulocytes: 0.01 10*3/uL (ref 0.00–0.07)
Basophils Absolute: 0 10*3/uL (ref 0.0–0.1)
Basophils Relative: 1 %
Eosinophils Absolute: 0.1 10*3/uL (ref 0.0–0.5)
Eosinophils Relative: 1 %
HCT: 39.2 % (ref 36.0–46.0)
Hemoglobin: 12.9 g/dL (ref 12.0–15.0)
Immature Granulocytes: 0 %
Lymphocytes Relative: 29 %
Lymphs Abs: 1.4 10*3/uL (ref 0.7–4.0)
MCH: 31.2 pg (ref 26.0–34.0)
MCHC: 32.9 g/dL (ref 30.0–36.0)
MCV: 94.9 fL (ref 80.0–100.0)
Monocytes Absolute: 0.4 10*3/uL (ref 0.1–1.0)
Monocytes Relative: 8 %
Neutro Abs: 3 10*3/uL (ref 1.7–7.7)
Neutrophils Relative %: 61 %
Platelet Count: 254 10*3/uL (ref 150–400)
RBC: 4.13 MIL/uL (ref 3.87–5.11)
RDW: 14.1 % (ref 11.5–15.5)
WBC Count: 4.9 10*3/uL (ref 4.0–10.5)
nRBC: 0 % (ref 0.0–0.2)

## 2019-03-31 LAB — CMP (CANCER CENTER ONLY)
ALT: 8 U/L (ref 0–44)
AST: 16 U/L (ref 15–41)
Albumin: 3.1 g/dL — ABNORMAL LOW (ref 3.5–5.0)
Alkaline Phosphatase: 76 U/L (ref 38–126)
Anion gap: 9 (ref 5–15)
BUN: 19 mg/dL (ref 8–23)
CO2: 23 mmol/L (ref 22–32)
Calcium: 8.5 mg/dL — ABNORMAL LOW (ref 8.9–10.3)
Chloride: 106 mmol/L (ref 98–111)
Creatinine: 0.87 mg/dL (ref 0.44–1.00)
GFR, Est AFR Am: >60 mL/min (ref >60)
GFR, Estimated: >60 mL/min (ref >60)
Glucose, Bld: 132 mg/dL — ABNORMAL HIGH (ref 70–99)
Potassium: 4.2 mmol/L (ref 3.5–5.1)
Sodium: 138 mmol/L (ref 135–145)
Total Bilirubin: 0.5 mg/dL (ref 0.3–1.2)
Total Protein: 6.7 g/dL (ref 6.5–8.1)

## 2019-03-31 LAB — TSH: TSH: 4.285 u[IU]/mL — ABNORMAL HIGH (ref 0.308–3.960)

## 2019-03-31 MED ORDER — SODIUM CHLORIDE 0.9 % IV SOLN
Freq: Once | INTRAVENOUS | Status: AC
  Administered 2019-03-31: 12:00:00 via INTRAVENOUS
  Filled 2019-03-31: qty 250

## 2019-03-31 MED ORDER — HEPARIN SOD (PORK) LOCK FLUSH 100 UNIT/ML IV SOLN
500.0000 [IU] | Freq: Once | INTRAVENOUS | Status: AC | PRN
  Administered 2019-03-31: 500 [IU]
  Filled 2019-03-31: qty 5

## 2019-03-31 MED ORDER — SODIUM CHLORIDE 0.9% FLUSH
10.0000 mL | INTRAVENOUS | Status: DC | PRN
  Administered 2019-03-31: 10 mL
  Filled 2019-03-31: qty 10

## 2019-03-31 MED ORDER — HYDROCOD POLST-CPM POLST ER 10-8 MG/5ML PO SUER: 5.0000 mL | Freq: Two times a day (BID) | ORAL | 0 refills | Status: DC | PRN

## 2019-03-31 MED ORDER — SODIUM CHLORIDE 0.9 % IV SOLN
480.0000 mg | Freq: Once | INTRAVENOUS | Status: AC
  Administered 2019-03-31: 12:00:00 480 mg via INTRAVENOUS
  Filled 2019-03-31: qty 48

## 2019-03-31 NOTE — Progress Notes (Signed)
Nutrition Assesment:  Patient identified on Malnutrition Screening report for weight loss and poor appetite  85 year old female with recurrent non-small cell lung cancer.  Past medical history of migraines  Patient currently on second line treatment of nivolumab.   Met with patient during infusion this pm.  Patient reports that she was in the hospital recently and did not have a good appetite during admission.  Reports that appetite is coming back.  Reports that she usually drinks carnation breakfast essentials drink for breakfast and may have toast or cereal or muffins.  Lunch is salad and mashed potatoes.  Does not eat much supper.  Snacks on peanut butter crackers and muffins.    Denies nausea, constipation, diarrhea.    Medications: MVI, Vit C  Labs: glucose 132  Anthropometrics:   Height: 62 inches Weight: 109 lb 9.6 oz today Noted 117 lb on 1/22.   BMI: 20  7% weight loss in the last 9 months   Estimated Energy Needs  Kcals: 1500-1750 Protein: 75-88 g Fluid: >1.5 L  NUTRITION DIAGNOSIS: Inadequate oral intake related to recent hospital admission, cancer as evidenced by 7% weight loss in the last 9 months and poor appetite   INTERVENTION:  Discussed ways to increase calories and protein in current eating pattern.  Handout provided Encouraged patient to increase carnation breakfast essentials to 2 per day Contact information provided to patient    MONITORING, EVALUATION, GOAL: Patient will consume adequate calories and protein to prevent weight loss   NEXT VISIT: November 25 during infusion  Benancio Osmundson B. Zenia Resides, [place], [place] Registered Dietitian [phone] (pager)

## 2019-03-31 NOTE — Telephone Encounter (Signed)
Per 10/28 los appts already scheduled.

## 2019-03-31 NOTE — Progress Notes (Signed)
[place] [phone]   [phone]  OFFICE PROGRESS NOTE  Patient, No Pcp Per No address on file  DIAGNOSIS: Recurrent non-small cell lung cancer, adenocarcinoma initially diagnosed as stage IB (T2, N0, M0) in June 2008 with tumor size of 8.0 cm.  PRIOR THERAPY: #1 status post left upper lobectomy with lymph node dissection under the care of Dr. Arlyce Dice on 06/29/2006.  #2 status post 4 cycles of adjuvant chemotherapy with cisplatin and Taxotere last dose given 08/01/2006.  #3 status post 6 cycles of systemic chemotherapy with carboplatin and Alimta for disease recurrence last dose given 12/12/2009.  #4 palliative radiotherapy to the enlarging right upper lobe lung mass under the care of Dr. Pablo Ledger completed on 11/14/2015. #5 Maintenance systemic chemotherapy with single agent Alimta 400 MG/M2 every 3 weeks, status post 113 cycles.  CURRENT THERAPY: Second line treatment with immunotherapy with Nivolumab 480 mg IV every 4 weeks.  First dose 09/17/2017.  Status post 20 cycles.  INTERVAL HISTORY: [name] 85 y.o. female returns to the clinic today for follow-up visit.  The patient is feeling fine today with no concerning complaints except for persistent dry cough and occasional shortness of breath with exertion.  She also has fatigue secondary to the atrial fibrillation.  She denied having any chest pain or hemoptysis.  She has no nausea, vomiting, diarrhea or constipation.  She has no headache or visual changes.  The patient is here today for evaluation before starting cycle #20 one of her treatment.   MEDICAL HISTORY: Past Medical History:  Diagnosis Date  . FHx: chemotherapy 2008&2011   4 cycles cisplatin,taxotere,2008& carboplatin and Alimta 2011  . History of migraine headaches   . Hypercholesterolemia   . lung ca dx'd 06/2006   rt and lt lung  . Lung cancer ([place]) 3/14/1   right- Adenocarcinoma w/bronchioalveolar features  .  Radiation 11/01/15-11/14/15   right upper lobe 30 gray  . Radiation pneumonitis ([place]) 01/24/2016    ALLERGIES:  is allergic to simvastatin.  MEDICATIONS:  Current Outpatient Medications  Medication Sig Dispense Refill  . albuterol (PROVENTIL HFA;VENTOLIN HFA) 108 (90 Base) MCG/ACT inhaler Inhale 1-2 puffs into the lungs every 6 (six) hours as needed for wheezing or shortness of breath. 1 Inhaler 2  . amiodarone (PACERONE) 200 MG tablet Take 1 tablet (200 mg total) by mouth 2 (two) times daily. 200 mg BID until10/18/2020, then 200 mg QD. 40 tablet 1  . apixaban (ELIQUIS) 2.5 MG TABS tablet Take 1 tablet (2.5 mg total) by mouth 2 (two) times daily. 60 tablet 1  . chlorpheniramine-HYDROcodone (TUSSIONEX) 10-8 MG/5ML SUER Take 5 mLs by mouth 2 (two) times daily as needed for cough. 140 mL 0  . metoprolol succinate (TOPROL-XL) 50 MG 24 hr tablet Take 1 tablet (50 mg total) by mouth daily. Take with or immediately following a meal.  Refills per cardiology or primary care 30 tablet 1  . Multiple Vitamins-Minerals (CENTRUM SILVER PO) Take 1 tablet by mouth daily. Reported on 12/13/2015    . vitamin C (ASCORBIC ACID) 500 MG tablet Take 500 mg by mouth daily.     No current facility-administered medications for this visit.     SURGICAL HISTORY:  Past Surgical History:  Procedure Laterality Date  . CATARACT EXTRACTION  2013   bilateral  . EMBOLECTOMY Right 03/13/2019   Procedure: RIGHT BRACHIAL, RADIAL, ULNAR EMBOLECTOMY;  Surgeon: Elam Dutch, MD;  Location: [facility];  Service: Vascular;  Laterality: Right;  . left  lowerlung lobectomy Left 06/19/2006   Dr.Burney,Left lower lobectomy  . MYRINGOTOMY Bilateral 06/25/2016  . Porta-cath  2011    REVIEW OF SYSTEMS:  A comprehensive review of systems was negative except for: Constitutional: positive for fatigue Respiratory: positive for cough and dyspnea on exertion   PHYSICAL EXAMINATION: General appearance: alert, cooperative and no distress  Head: Normocephalic, without obvious abnormality, atraumatic Neck: no adenopathy, no JVD, supple, symmetrical, trachea midline and thyroid not enlarged, symmetric, no tenderness/mass/nodules Lymph nodes: Cervical, supraclavicular, and axillary nodes normal. Resp: clear to auscultation bilaterally Back: symmetric, no curvature. ROM normal. No CVA tenderness. Cardio: regular rate and rhythm, S1, S2 normal, no murmur, click, rub or gallop and normal apical impulse GI: soft, non-tender; bowel sounds normal; no masses,  no organomegaly Extremities: extremities normal, atraumatic, no cyanosis or edema  ECOG PERFORMANCE STATUS: 1 - Symptomatic but completely ambulatory  Blood pressure (!) 142/68, pulse (!) 49, temperature 97.8 F (36.6 C), temperature source Temporal, resp. rate 17, height 5\' 2"  (1.575 m), weight 109 lb 9.6 oz (49.7 kg), SpO2 100 %.  LABORATORY DATA: Lab Results  Component Value Date   WBC 4.9 03/31/2019   HGB 12.9 03/31/2019   HCT 39.2 03/31/2019   MCV 94.9 03/31/2019   PLT 254 03/31/2019      Chemistry      Component Value Date/Time   NA 138 03/31/2019 1013   NA 142 05/28/2017 1015   K 4.2 03/31/2019 1013   K 4.2 05/28/2017 1015   CL 106 03/31/2019 1013   CL 108 (H) 11/04/2012 0947   CO2 23 03/31/2019 1013   CO2 23 05/28/2017 1015   BUN 19 03/31/2019 1013   BUN 14.2 05/28/2017 1015   CREATININE 0.87 03/31/2019 1013   CREATININE 0.9 05/28/2017 1015      Component Value Date/Time   CALCIUM 8.5 (L) 03/31/2019 1013   CALCIUM 9.0 05/28/2017 1015   ALKPHOS 76 03/31/2019 1013   ALKPHOS 84 05/28/2017 1015   AST 16 03/31/2019 1013   AST 19 05/28/2017 1015   ALT 8 03/31/2019 1013   ALT 7 05/28/2017 1015   BILITOT 0.5 03/31/2019 1013   BILITOT 0.54 05/28/2017 1015       RADIOGRAPHIC STUDIES: Dg Chest 2 View  Result Date: 03/19/2019 CLINICAL DATA:  Shortness of breath, cough EXAM: CHEST - 2 VIEW COMPARISON:  CT 01/04/2019 FINDINGS: Right Port-A-Cath in  place with the tip at the cavoatrial junction. Large right upper paramediastinal mass again noted as seen on recent chest CT. Moderate right pleural effusion and small left pleural effusion, new since prior CT. Bibasilar atelectasis. Bilateral nodular opacities. No acute bony abnormality. IMPRESSION: Large right paramediastinal mass and bilateral nodular opacities again noted as seen on recent chest CT. Moderate right pleural effusion and small left pleural effusion with bibasilar atelectasis. Electronically Signed   By: Rolm Baptise M.D.   On: 03/19/2019 11:13   Dg Chest Port 1 View  Result Date: 03/22/2019 CLINICAL DATA:  Status post right thoracentesis. EXAM: PORTABLE CHEST 1 VIEW COMPARISON:  Chest x-ray dated March 19, 2019. FINDINGS: Unchanged right chest wall port catheter. A decreased now small right pleural effusion status post thoracentesis. Unchanged small left pleural effusion. No pneumothorax. Grossly unchanged large right upper paramediastinal mass. Previously noted small nodular opacities in both lungs are not as well visualized on today's study. Normal heart size. No acute osseous abnormality. IMPRESSION: 1. Decreased now small right pleural effusion status post thoracentesis. No  pneumothorax. 2. Grossly unchanged large right upper paramediastinal mass. Electronically Signed   By: Titus Dubin M.D.   On: 03/22/2019 12:03   US Thoracentesis Asp Pleural Space W/img Guide  Result Date: 03/22/2019 INDICATION: Patient with history of lung cancer, right pleural effusion. Request is made for diagnostic and therapeutic thoracentesis. EXAM: ULTRASOUND GUIDED DIAGNOSTIC AND THERAPEUTIC RIGHT THORACENTESIS MEDICATIONS: 10 mL 1% lidocaine COMPLICATIONS: None immediate. PROCEDURE: An ultrasound guided thoracentesis was thoroughly discussed with the patient and questions answered. The benefits, risks, alternatives and complications were also discussed. The patient understands and wishes to proceed  with the procedure. Written consent was obtained. Ultrasound was performed to localize and mark an adequate pocket of fluid in the right chest. The area was then prepped and draped in the normal sterile fashion. 1% Lidocaine was used for local anesthesia. Under ultrasound guidance a 6 Fr Safe-T-Centesis catheter was introduced. Thoracentesis was performed. The catheter was removed and a dressing applied. FINDINGS: A total of approximately 300 mL of yellow fluid was removed. Samples were sent to the laboratory as requested by the clinical team. IMPRESSION: Successful ultrasound guided diagnostic and therapeutic right thoracentesis yielding 300 mL of pleural fluid. Read by: Brynda Greathouse [name] Electronically Signed   By: Jacqulynn Cadet M.D.   On: 03/22/2019 13:33    ASSESSMENT AND PLAN:  This is a very pleasant 85 years old white female with recurrent non-small cell lung cancer, adenocarcinoma status post induction systemic chemotherapy with carboplatin and Alimta with partial response. She is currently on maintenance treatment with single agent Alimta status post 113 cycles. The patient has been tolerating this treatment well with no concerning complaints. Unfortunately restaging scan after cycle 113 showed evidence for disease progression. The patient was started on second line treatment with immunotherapy with Nivolumab 480 mg IV every 4 weeks status post 20 cycles. The patient has been tolerating this treatment well with no concerning adverse effects. I recommended for her to proceed with cycle #21 today as planned. I will see her back for follow-up visit in 4 weeks for evaluation after repeating CT scan of the chest for restaging of her disease. For the dry cough, she will continue on Tussionex.  I gave her a refill today. The patient was advised to call immediately if she has any concerning symptoms in the interval. The patient voices understanding of current disease status and treatment options  and is in agreement with the current care plan. All questions were answered. The patient knows to call the clinic with any problems, questions or concerns. We can certainly see the patient much sooner if necessary.  Disclaimer: This note was dictated with voice recognition software. Similar sounding words can inadvertently be transcribed and may not be corrected upon review.

## 2019-03-31 NOTE — Patient Instructions (Signed)
[facility] Discharge Instructions for Patients Receiving Chemotherapy  Today you received the following chemotherapy agents Opdivo  To help prevent nausea and vomiting after your treatment, we encourage you to take your nausea medication as directed   If you develop nausea and vomiting that is not controlled by your nausea medication, call the clinic.   BELOW ARE SYMPTOMS THAT SHOULD BE REPORTED IMMEDIATELY:  *FEVER GREATER THAN 100.5 F  *CHILLS WITH OR WITHOUT FEVER  NAUSEA AND VOMITING THAT IS NOT CONTROLLED WITH YOUR NAUSEA MEDICATION  *UNUSUAL SHORTNESS OF BREATH  *UNUSUAL BRUISING OR BLEEDING  TENDERNESS IN MOUTH AND THROAT WITH OR WITHOUT PRESENCE OF ULCERS  *URINARY PROBLEMS  *BOWEL PROBLEMS  UNUSUAL RASH Items with * indicate a potential emergency and should be followed up as soon as possible.  Feel free to call the clinic should you have any questions or concerns. The clinic phone number is [phone].  Please show the CHEMO ALERT CARD at check-in to the Emergency Department and triage nurse.   

## 2019-04-08 ENCOUNTER — Other Ambulatory Visit: Payer: Self-pay

## 2019-04-08 ENCOUNTER — Ambulatory Visit (INDEPENDENT_AMBULATORY_CARE_PROVIDER_SITE_OTHER): Payer: Self-pay | Admitting: Physician Assistant

## 2019-04-08 VITALS — BP 124/77 | HR 49 | Temp 97.9°F | Resp 12 | Ht 62.0 in | Wt 107.1 lb

## 2019-04-08 DIAGNOSIS — I742 Embolism and thrombosis of arteries of the upper extremities: Secondary | ICD-10-CM

## 2019-04-08 NOTE — Progress Notes (Signed)
  POST OPERATIVE OFFICE NOTE    CC:  F/u for surgery: Right brachial ulnar radial artery embolectomy  HPI:  This is a 85 y.o. female who is s/p  She reported to the ER on 03/12/2019 with a 6 hour history of intermittent numbness coolness tingling in her right hand.  This was sudden onset.   She was noted to have no Doppler signals in the right hand.  She was also noted to be in rapid A. fib.  She has no prior history of atrial fibrillation.  She was placed on Heparin and Diltiazem drip.    Discharge plan per Cardiology: -C/w amiodarone, metoprolol - F/u with outpatient cardiology -Switch to Eliquis 2.5mg  BID             Palpable left radial pulse.   She is here today for f/u right UE arterial flow.  She denise right UE numbness, pain or loss of motor.  Allergies  Allergen Reactions  . Simvastatin Other (See Comments)    Cluster migraines    Current Outpatient Medications  Medication Sig Dispense Refill  . albuterol (PROVENTIL HFA;VENTOLIN HFA) 108 (90 Base) MCG/ACT inhaler Inhale 1-2 puffs into the lungs every 6 (six) hours as needed for wheezing or shortness of breath. 1 Inhaler 2  . amiodarone (PACERONE) 200 MG tablet Take 1 tablet (200 mg total) by mouth 2 (two) times daily. 200 mg BID until10/18/2020, then 200 mg QD. 40 tablet 1  . apixaban (ELIQUIS) 2.5 MG TABS tablet Take 1 tablet (2.5 mg total) by mouth 2 (two) times daily. 60 tablet 1  . chlorpheniramine-HYDROcodone (TUSSIONEX) 10-8 MG/5ML SUER Take 5 mLs by mouth 2 (two) times daily as needed for cough. 140 mL 0  . metoprolol succinate (TOPROL-XL) 50 MG 24 hr tablet Take 1 tablet (50 mg total) by mouth daily. Take with or immediately following a meal.  Refills per cardiology or primary care 30 tablet 1  . Multiple Vitamins-Minerals (CENTRUM SILVER PO) Take 1 tablet by mouth daily. Reported on 12/13/2015    . vitamin C (ASCORBIC ACID) 500 MG tablet Take 500 mg by mouth daily.     No current facility-administered medications for  this visit.      ROS:  See HPI  Physical Exam:    Incision:  Well healed right AC incision. Extremities:  Palpable radial and brachial pulses B UE/LE equal.   Heart RRR Lungs Non labored breathing  Assessment/Plan:  This is a 85 y.o. female who is s/p: Right brachial ulnar radial artery embolectomy  No neuro deficits with patent arterial blood flow in the right UE. She will f/u as needed.  She continues to take amiodarone, metoprolol and Eliquis.  She is followed Primarily by Dr. Julien Nordmann.  I don't see a cardiology f/u appt.  She saw Dr. Bronson Ing while admitted to [facility].     Roxy Horseman [name] [facility] [phone]  Clinic MD:  Scot Dock

## 2019-04-14 ENCOUNTER — Other Ambulatory Visit: Payer: Self-pay

## 2019-04-14 ENCOUNTER — Emergency Department (HOSPITAL_COMMUNITY): Payer: Medicare Other

## 2019-04-14 ENCOUNTER — Inpatient Hospital Stay (HOSPITAL_COMMUNITY)
Admission: EM | Admit: 2019-04-14 | Discharge: 2019-04-16 | DRG: 536 | Disposition: A | Discharge status: Skilled Nursing Facility | Payer: Medicare Other | Attending: Family Medicine | Admitting: Family Medicine

## 2019-04-14 ENCOUNTER — Inpatient Hospital Stay (HOSPITAL_COMMUNITY): Payer: Medicare Other

## 2019-04-14 ENCOUNTER — Encounter (HOSPITAL_COMMUNITY): Payer: Self-pay | Admitting: Emergency Medicine

## 2019-04-14 DIAGNOSIS — E871 Hypo-osmolality and hyponatremia: Secondary | ICD-10-CM | POA: Diagnosis not present

## 2019-04-14 DIAGNOSIS — Z681 Body mass index (BMI) 19 or less, adult: Secondary | ICD-10-CM

## 2019-04-14 DIAGNOSIS — Z923 Personal history of irradiation: Secondary | ICD-10-CM

## 2019-04-14 DIAGNOSIS — Z8249 Family history of ischemic heart disease and other diseases of the circulatory system: Secondary | ICD-10-CM | POA: Diagnosis not present

## 2019-04-14 DIAGNOSIS — S32491A Other specified fracture of right acetabulum, initial encounter for closed fracture: Principal | ICD-10-CM | POA: Diagnosis present

## 2019-04-14 DIAGNOSIS — Z7901 Long term (current) use of anticoagulants: Secondary | ICD-10-CM | POA: Diagnosis not present

## 2019-04-14 DIAGNOSIS — E7800 Pure hypercholesterolemia, unspecified: Secondary | ICD-10-CM | POA: Diagnosis present

## 2019-04-14 DIAGNOSIS — T148XXA Other injury of unspecified body region, initial encounter: Secondary | ICD-10-CM

## 2019-04-14 DIAGNOSIS — I4892 Unspecified atrial flutter: Secondary | ICD-10-CM | POA: Diagnosis present

## 2019-04-14 DIAGNOSIS — W010XXA Fall on same level from slipping, tripping and stumbling without subsequent striking against object, initial encounter: Secondary | ICD-10-CM | POA: Diagnosis present

## 2019-04-14 DIAGNOSIS — Z8679 Personal history of other diseases of the circulatory system: Secondary | ICD-10-CM | POA: Diagnosis not present

## 2019-04-14 DIAGNOSIS — I480 Paroxysmal atrial fibrillation: Secondary | ICD-10-CM | POA: Diagnosis present

## 2019-04-14 DIAGNOSIS — Z20828 Contact with and (suspected) exposure to other viral communicable diseases: Secondary | ICD-10-CM | POA: Diagnosis present

## 2019-04-14 DIAGNOSIS — E46 Unspecified protein-calorie malnutrition: Secondary | ICD-10-CM | POA: Diagnosis present

## 2019-04-14 DIAGNOSIS — S32409A Unspecified fracture of unspecified acetabulum, initial encounter for closed fracture: Secondary | ICD-10-CM | POA: Diagnosis not present

## 2019-04-14 DIAGNOSIS — S32471A Displaced fracture of medial wall of right acetabulum, initial encounter for closed fracture: Secondary | ICD-10-CM

## 2019-04-14 DIAGNOSIS — C3411 Malignant neoplasm of upper lobe, right bronchus or lung: Secondary | ICD-10-CM | POA: Diagnosis present

## 2019-04-14 DIAGNOSIS — Y92013 Bedroom of single-family (private) house as the place of occurrence of the external cause: Secondary | ICD-10-CM

## 2019-04-14 DIAGNOSIS — Z79899 Other long term (current) drug therapy: Secondary | ICD-10-CM | POA: Diagnosis not present

## 2019-04-14 DIAGNOSIS — J700 Acute pulmonary manifestations due to radiation: Secondary | ICD-10-CM

## 2019-04-14 DIAGNOSIS — R05 Cough: Secondary | ICD-10-CM

## 2019-04-14 DIAGNOSIS — S32591A Other specified fracture of right pubis, initial encounter for closed fracture: Secondary | ICD-10-CM | POA: Diagnosis present

## 2019-04-14 DIAGNOSIS — C3492 Malignant neoplasm of unspecified part of left bronchus or lung: Secondary | ICD-10-CM

## 2019-04-14 DIAGNOSIS — R059 Cough, unspecified: Secondary | ICD-10-CM

## 2019-04-14 DIAGNOSIS — C3491 Malignant neoplasm of unspecified part of right bronchus or lung: Secondary | ICD-10-CM

## 2019-04-14 HISTORY — DX: Unspecified atrial fibrillation: I48.91

## 2019-04-14 LAB — CBC WITH DIFFERENTIAL/PLATELET
Abs Immature Granulocytes: 0.04 10*3/uL (ref 0.00–0.07)
Basophils Absolute: 0 10*3/uL (ref 0.0–0.1)
Basophils Relative: 0 %
Eosinophils Absolute: 0 10*3/uL (ref 0.0–0.5)
Eosinophils Relative: 0 %
HCT: 45.7 % (ref 36.0–46.0)
Hemoglobin: 14.8 g/dL (ref 12.0–15.0)
Immature Granulocytes: 1 %
Lymphocytes Relative: 8 %
Lymphs Abs: 0.7 10*3/uL (ref 0.7–4.0)
MCH: 31.9 pg (ref 26.0–34.0)
MCHC: 32.4 g/dL (ref 30.0–36.0)
MCV: 98.5 fL (ref 80.0–100.0)
Monocytes Absolute: 0.4 10*3/uL (ref 0.1–1.0)
Monocytes Relative: 5 %
Neutro Abs: 6.9 10*3/uL (ref 1.7–7.7)
Neutrophils Relative %: 86 %
Platelets: 224 10*3/uL (ref 150–400)
RBC: 4.64 MIL/uL (ref 3.87–5.11)
RDW: 14.5 % (ref 11.5–15.5)
WBC: 8 10*3/uL (ref 4.0–10.5)
nRBC: 0 % (ref 0.0–0.2)

## 2019-04-14 LAB — BASIC METABOLIC PANEL
Anion gap: 8 (ref 5–15)
BUN: 21 mg/dL (ref 8–23)
CO2: 25 mmol/L (ref 22–32)
Calcium: 9.1 mg/dL (ref 8.9–10.3)
Chloride: 102 mmol/L (ref 98–111)
Creatinine, Ser: 0.94 mg/dL (ref 0.44–1.00)
GFR calc Af Amer: >60 mL/min (ref >60)
GFR calc non Af Amer: 56 mL/min — ABNORMAL LOW (ref >60)
Glucose, Bld: 157 mg/dL — ABNORMAL HIGH (ref 70–99)
Potassium: 4.3 mmol/L (ref 3.5–5.1)
Sodium: 135 mmol/L (ref 135–145)

## 2019-04-14 LAB — SARS CORONAVIRUS 2 (TAT 6-24 HRS): SARS Coronavirus 2: NEGATIVE

## 2019-04-14 LAB — PROTIME-INR
INR: 1.2 (ref 0.8–1.2)
Prothrombin Time: 15.3 seconds — ABNORMAL HIGH (ref 11.4–15.2)

## 2019-04-14 LAB — TYPE AND SCREEN
ABO/RH(D): O NEG
Antibody Screen: NEGATIVE

## 2019-04-14 LAB — ABO/RH: ABO/RH(D): O NEG

## 2019-04-14 MED ORDER — ONDANSETRON HCL 4 MG/2ML IJ SOLN
4.0000 mg | Freq: Once | INTRAMUSCULAR | Status: AC
  Administered 2019-04-14: 09:00:00 4 mg via INTRAVENOUS
  Filled 2019-04-14: qty 2

## 2019-04-14 MED ORDER — METOPROLOL SUCCINATE ER 50 MG PO TB24
50.0000 mg | ORAL_TABLET | Freq: Every day | ORAL | Status: DC
  Filled 2019-04-14 (×2): qty 1

## 2019-04-14 MED ORDER — APIXABAN 2.5 MG PO TABS
2.5000 mg | ORAL_TABLET | Freq: Two times a day (BID) | ORAL | Status: DC
  Administered 2019-04-14 – 2019-04-16 (×4): 2.5 mg via ORAL
  Filled 2019-04-14 (×4): qty 1

## 2019-04-14 MED ORDER — SODIUM CHLORIDE 0.9% FLUSH
3.0000 mL | Freq: Two times a day (BID) | INTRAVENOUS | Status: DC
  Administered 2019-04-14 – 2019-04-15 (×4): 3 mL via INTRAVENOUS

## 2019-04-14 MED ORDER — HYDROCOD POLST-CPM POLST ER 10-8 MG/5ML PO SUER: 5.0000 mL | Freq: Two times a day (BID) | ORAL | Status: DC | PRN

## 2019-04-14 MED ORDER — ACETAMINOPHEN 325 MG PO TABS
650.0000 mg | ORAL_TABLET | Freq: Four times a day (QID) | ORAL | Status: DC | PRN
  Administered 2019-04-14: 17:00:00 650 mg via ORAL
  Filled 2019-04-14: qty 2

## 2019-04-14 MED ORDER — VITAMIN C 500 MG PO TABS
500.0000 mg | ORAL_TABLET | Freq: Every day | ORAL | Status: DC
  Administered 2019-04-15 – 2019-04-16 (×2): 500 mg via ORAL
  Filled 2019-04-14 (×2): qty 1

## 2019-04-14 MED ORDER — PRO-STAT SUGAR FREE PO LIQD
30.0000 mL | Freq: Every day | ORAL | Status: DC
  Administered 2019-04-15 – 2019-04-16 (×2): 30 mL via ORAL
  Filled 2019-04-14 (×2): qty 30

## 2019-04-14 MED ORDER — ADULT MULTIVITAMIN W/MINERALS CH
1.0000 | ORAL_TABLET | Freq: Every day | ORAL | Status: DC
  Administered 2019-04-15 – 2019-04-16 (×2): 1 via ORAL
  Filled 2019-04-14 (×2): qty 1

## 2019-04-14 MED ORDER — FENTANYL CITRATE (PF) 100 MCG/2ML IJ SOLN
25.0000 ug | INTRAMUSCULAR | Status: DC | PRN
  Administered 2019-04-14: 09:00:00 25 ug via INTRAVENOUS
  Filled 2019-04-14: qty 2

## 2019-04-14 MED ORDER — ACETAMINOPHEN 650 MG RE SUPP: 650.0000 mg | Freq: Four times a day (QID) | RECTAL | Status: DC | PRN

## 2019-04-14 MED ORDER — HYDROCODONE-ACETAMINOPHEN 5-325 MG PO TABS
1.0000 | ORAL_TABLET | ORAL | Status: DC | PRN
  Filled 2019-04-14: qty 1

## 2019-04-14 MED ORDER — ALBUTEROL SULFATE (2.5 MG/3ML) 0.083% IN NEBU: 2.5000 mg | INHALATION_SOLUTION | Freq: Four times a day (QID) | RESPIRATORY_TRACT | Status: DC | PRN

## 2019-04-14 MED ORDER — AMIODARONE HCL 200 MG PO TABS
200.0000 mg | ORAL_TABLET | Freq: Every day | ORAL | Status: DC
  Administered 2019-04-14 – 2019-04-16 (×3): 200 mg via ORAL
  Filled 2019-04-14 (×4): qty 1

## 2019-04-14 NOTE — H&P (Signed)
History and Physical    [name] MVE:[id number] DOB: April 13, 1933 DOA: 04/14/2019  PCP: [name], MD  Patient coming from: Home  I have personally briefly reviewed patient's old medical records in [place]  Chief Complaint: Fall, hip pain  HPI: [name] is a 85 y.o. female with medical history significant of atrial fibrillation complicated by radial artery embolus status post embolectomy, now on anticoagulation, non-small cell lung cancer on chemotherapy who presents after fall this morning.  Patient was at her most recent baseline health up until around 4 AM this morning.  She reports she was getting out of bed to use the restroom which was not abnormal.  She was not woken up by any pain or discomfort.  She states she got up and had a mechanical fall just fell over onto her right side.  She denies any loss of consciousness, no dizziness, lightheadedness, nausea/vomiting, no chest pain or palpitations.  She states that she is right at the side of the bed.  She denies any trauma to the head.  She recalls the event fully.  She states she slid her way over to a nearby chair and propped herself up in the chair.  Her daughter who lives next door came over alongside her husband and they assisted the patient to their vehicle and brought her in.  The patient reports she has never had a fall like this before and she normally gets around without any equipment.  She denies any other acute symptoms at this time.  Patient has a history of non-small cell lung cancer, adenocarcinoma and follows with Dr. Julien Nordmann of oncology.  She states she has chemo once a month and has been on it for over 13 years and has no acute complaints.  She states she does have chronic issues of cough from history of radiation therapy as well as altered sense of taste which leads to poor appetite and she has had subsequent weight loss.  Patient was recently admitted to [facility] on October 9 with new onset  digital ischemia to the right hand.  She underwent embolectomy.  Patient was noted to have A. fib flutter with spontaneous conversion.  She was evaluated by cardiology and started on amiodarone and metoprolol during this admission as well as apixaban.  She is otherwise independent in her ADLs and lives alone but one of her daughters lives next door.  She.denies any history of smoking, alcohol use, any other drug use.    Review of Systems: As per HPI otherwise 10 point review of systems negative.    Past Medical History:  Diagnosis Date   FHx: chemotherapy 2008&2011   4 cycles cisplatin,taxotere,2008& carboplatin and Alimta 2011   History of migraine headaches    Hypercholesterolemia    lung ca dx'd 06/2006   rt and lt lung   Lung cancer ([place]) 3/14/1   right- Adenocarcinoma w/bronchioalveolar features   Radiation 11/01/15-11/14/15   right upper lobe 30 gray   Radiation pneumonitis ([place]) 01/24/2016    Past Surgical History:  Procedure Laterality Date   CATARACT EXTRACTION  2013   bilateral   EMBOLECTOMY Right 03/13/2019   Procedure: RIGHT BRACHIAL, RADIAL, ULNAR EMBOLECTOMY;  Surgeon: Elam Dutch, MD;  Location: [facility];  Service: Vascular;  Laterality: Right;   left  lowerlung lobectomy Left 06/19/2006   Dr.Burney,Left lower lobectomy   MYRINGOTOMY Bilateral 06/25/2016   Porta-cath  2011     reports that she has never smoked. She has never  used smokeless tobacco. She reports that she does not drink alcohol or use drugs.  Allergies  Allergen Reactions   Simvastatin Other (See Comments)    Cluster migraines    Family History  Problem Relation Age of Onset   Hypertension Father      Prior to Admission medications   Medication Sig Start Date End Date Taking? Authorizing Provider  amiodarone (PACERONE) 200 MG tablet Take 1 tablet (200 mg total) by mouth 2 (two) times daily. 200 mg BID until10/18/2020, then 200 mg QD. 03/16/19  Yes Collins, Emma M, [name]    apixaban (ELIQUIS) 2.5 MG TABS tablet Take 1 tablet (2.5 mg total) by mouth 2 (two) times daily. 03/16/19  Yes Ulyses Amor, [name]  chlorpheniramine-HYDROcodone (TUSSIONEX) 10-8 MG/5ML SUER Take 5 mLs by mouth 2 (two) times daily as needed for cough. 03/31/19  Yes Curt Bears, MD  metoprolol succinate (TOPROL-XL) 50 MG 24 hr tablet Take 1 tablet (50 mg total) by mouth daily. Take with or immediately following a meal.  Refills per cardiology or primary care 03/17/19  Yes Ulyses Amor, [name]  Multiple Vitamin (MULTIVITAMIN WITH MINERALS) TABS tablet Take 1 tablet by mouth daily.   Yes [provider]  vitamin C (ASCORBIC ACID) 500 MG tablet Take 500 mg by mouth daily.   Yes [provider]  albuterol (PROVENTIL HFA;VENTOLIN HFA) 108 (90 Base) MCG/ACT inhaler Inhale 1-2 puffs into the lungs every 6 (six) hours as needed for wheezing or shortness of breath. Patient not taking: Reported on 04/14/2019 11/12/17   Maryanna Shape, NP    Physical Exam: Vitals:   04/14/19 1000 04/14/19 1030 04/14/19 1100 04/14/19 1130  BP: (!) 158/65 (!) 159/65 (!) 145/61 140/67  Pulse: (!) 49 (!) 51 (!) 51 (!) 51  Resp:    19  Temp:      TempSrc:      SpO2: 100% 99% 100% 97%    Constitutional: NAD, calm, comfortable Vitals:   04/14/19 1000 04/14/19 1030 04/14/19 1100 04/14/19 1130  BP: (!) 158/65 (!) 159/65 (!) 145/61 140/67  Pulse: (!) 49 (!) 51 (!) 51 (!) 51  Resp:    19  Temp:      TempSrc:      SpO2: 100% 99% 100% 97%   General: Pleasant, no acute distress, thin Eyes: PERRL, lids and conjunctivae normal, EOMI ENMT: Mucous membranes are moist. Posterior pharynx clear of any exudate or lesions.Normal dentition.  Neck: normal, supple, no masses, no thyromegaly Respiratory: clear to auscultation bilaterally, no wheezing, no crackles. Normal respiratory effort. No accessory muscle use.  Cardiovascular: Regular rate and rhythm, no murmurs / rubs / gallops. No extremity edema. 2+  pedal pulses. No carotid bruits.  Abdomen: no tenderness, no masses palpated. No hepatosplenomegaly. Bowel sounds positive.  Musculoskeletal: no clubbing / cyanosis. No joint deformity upper and lower extremities. Good ROM except for right lower extremity which is limited by fracture, no contractures. Normal muscle tone.  Surgical scar to right upper extremity present Skin: no new rashes, lesions, ulcers. No induration Neurologic: CN 2-12 grossly intact.  Sensation strength grossly intact except for right lower extremity.  Psychiatric: Normal judgment and insight. Alert and oriented x 3. Normal mood.    Labs on Admission: I have personally reviewed following labs and imaging studies  CBC: Recent Labs  Lab 04/14/19 0912  WBC 8.0  NEUTROABS 6.9  HGB 14.8  HCT 45.7  MCV 98.5  PLT 062   Basic Metabolic Panel: Recent Labs  Lab 04/14/19 0912  NA 135  K 4.3  CL 102  CO2 25  GLUCOSE 157*  BUN 21  CREATININE 0.94  CALCIUM 9.1   GFR: Estimated Creatinine Clearance: 34.2 mL/min (by C-G formula based on SCr of 0.94 mg/dL). Liver Function Tests: No results for input(s): AST, ALT, ALKPHOS, BILITOT, PROT, ALBUMIN in the last 168 hours. No results for input(s): LIPASE, AMYLASE in the last 168 hours. No results for input(s): AMMONIA in the last 168 hours. Coagulation Profile: Recent Labs  Lab 04/14/19 0912  INR 1.2   Cardiac Enzymes: No results for input(s): CKTOTAL, CKMB, CKMBINDEX, TROPONINI in the last 168 hours. BNP (last 3 results) No results for input(s): PROBNP in the last 8760 hours. HbA1C: No results for input(s): HGBA1C in the last 72 hours. CBG: No results for input(s): GLUCAP in the last 168 hours. Lipid Profile: No results for input(s): CHOL, HDL, LDLCALC, TRIG, CHOLHDL, LDLDIRECT in the last 72 hours. Thyroid Function Tests: No results for input(s): TSH, T4TOTAL, FREET4, T3FREE, THYROIDAB in the last 72 hours. Anemia Panel: No results for input(s): VITAMINB12,  FOLATE, FERRITIN, TIBC, IRON, RETICCTPCT in the last 72 hours. Urine analysis:    Component Value Date/Time   LABSPEC 1.030 03/17/2012 1051   PHURINE 5.0 03/17/2012 1051   HGBUR Negative 03/17/2012 1051   BILIRUBINUR Negative 03/17/2012 1051   KETONESUR Negative 03/17/2012 1051   PROTEINUR Negative 03/17/2012 1051   NITRITE Negative 03/17/2012 1051   LEUKOCYTESUR Negative 03/17/2012 1051    Radiological Exams on Admission: Dg Chest 1 View  Result Date: 04/14/2019 CLINICAL DATA:  [place] backwards this morning. EXAM: CHEST  1 VIEW COMPARISON:  03/22/2019 FINDINGS: Right IJ Port-A-Cath unchanged. Lungs are adequately inflated with interval improvement in right effusion with only small residual amount of right pleural fluid. Known right paramediastinal mass unchanged. Lungs are otherwise clear. Cardiomediastinal silhouette and remainder of the exam is unchanged. IMPRESSION: 1. No acute cardiopulmonary disease. Interval improved right pleural effusion with small residual amount of pleural fluid. 2.  Stable right paramediastinal masslike density. Electronically Signed   By: Marin Olp M.D.   On: 04/14/2019 09:40   Dg Hip Unilat With Pelvis 2-3 Views Right  Result Date: 04/14/2019 CLINICAL DATA:  [place] backwards today while going to the bathroom. EXAM: DG HIP (WITH OR WITHOUT PELVIS) 2-3V RIGHT COMPARISON:  None. FINDINGS: Diffuse osteopenia. Minimal degenerative changes of the hips. There is a displaced fracture over the medial portion of the right acetabulum as well as minimally displaced fracture of the right inferior pubic ramus. IMPRESSION: Displaced fractures of the medial right acetabulum and right inferior pubic ramus. Electronically Signed   By: Marin Olp M.D.   On: 04/14/2019 09:37    EKG: Independently reviewed.  Sinus rhythm  Assessment/Plan [name] is a 84 y.o. female with medical history significant of atrial fibrillation complicated by radial artery embolus status  post embolectomy, now on anticoagulation, non-small cell lung cancer on chemotherapy who presents after fall this morning.  #Acute right acetabular fracture and inferior pubic ramus fracture #Ground-level fall -Patient with ground-level fall this morning without syncope or preceding symptoms.  Plain film reveals displaced fracture of the medial right acetabulum and right inferior pubic ramus.  Case was discussed with Dr. Erlinda Hong and patient is to be nonweightbearing. -Requested PT and OT evaluations and anticipate higher level of care for patient -Continue pain control  #Paroxysmal atrial fibrillation -Patient with new onset in October, complicated by embolus to right radial artery now  status post embolectomy -Spontaneously converted during the admission and remains in sinus rhythm at this time -Reduced amiodarone dose to 200 mg daily per instructions - the patient was taking it twice daily at home -Continue metoprolol, hold for bradycardia Continue apixaban 2.5 mg twice daily -Monitor electrolytes  #Non-small cell lung cancer -Follows with Dr. Julien Nordmann, on Alimta -Has chronic cough and altered taste leading to poor intake  #Malnutrition #Underweight (BMI 19.59) -Requesting nutrition consult  DVT prophylaxis: On apixaban Code Status: Full Family Communication: Daughter at bedside Disposition Plan: Anticipate SNF Consults called: Orthopedic surgery, Dr. Erlinda Hong Admission status: Inpatient   Truddie Hidden MD [facility] Pager [phone]  If 7PM-7AM, please contact night-coverage [email or web address] Password [email or web address]  04/14/2019, 11:58 AM

## 2019-04-14 NOTE — ED Triage Notes (Signed)
Pt reports going to the bathroom around 4am and falling backwards. Pt denies being dizzy and denies hitting her head. Pt was able to get herself up and get dressed.  AOx4.

## 2019-04-14 NOTE — ED Provider Notes (Signed)
[facility] DEPT Provider Note   CSN: [id number] Arrival date & time: 04/14/19  0938     History   Chief Complaint Chief Complaint  Patient presents with  . Hip Pain  . Fall    HPI [name] is a 85 y.o. female.     Patient is an 85 year old female with a history of non-small cell lung cancer currently getting active chemotherapy, atrial fibrillation on Eliquis and recent brachial artery embolism who is presenting today after a fall at home.  Patient states she woke up about 4 AM to go to the bathroom when she lost her balance and fell backwards landing on her right hip.  She was not able to stand but she was able to crawl back to the bed and get back into bed.  She denies any dizziness, lightheadedness, chest pain, shortness of breath or syncope before or after the fall.  She denies any head injury or neck pain.  However when she tried to get up this morning she was still having significant pain and was unable to walk on her right leg.  She denies any numbness or tingling in her distal extremities.  The pain is worse with any movement of the right leg or attempting to walk  The history is provided by the patient.  Hip Pain This is a new problem. The current episode started 6 to 12 hours ago. The problem occurs constantly. The problem has not changed since onset.Associated symptoms comments: Right hip pain.  No loc or head injury. The symptoms are aggravated by walking, bending and twisting. The symptoms are relieved by rest. She has tried rest for the symptoms. The treatment provided no relief.  Fall    Past Medical History:  Diagnosis Date  . FHx: chemotherapy 2008&2011   4 cycles cisplatin,taxotere,2008& carboplatin and Alimta 2011  . History of migraine headaches   . Hypercholesterolemia   . lung ca dx'd 06/2006   rt and lt lung  . Lung cancer ([place]) 3/14/1   right- Adenocarcinoma w/bronchioalveolar features  . Radiation 11/01/15-11/14/15    right upper lobe 30 gray  . Radiation pneumonitis ([place]) 01/24/2016    Patient Active Problem List   Diagnosis Date Noted  . Brachial artery embolus ([place]) 03/12/2019  . Port-A-Cath in place 12/10/2017  . Goals of care, counseling/discussion 09/10/2017  . Encounter for antineoplastic immunotherapy 09/10/2017  . Bronchitis 08/16/2016  . Radiation pneumonitis ([place]) 01/24/2016  . Encounter for antineoplastic chemotherapy 12/14/2014  . Bilateral lung cancer ([place]) 04/08/2011    Past Surgical History:  Procedure Laterality Date  . CATARACT EXTRACTION  2013   bilateral  . EMBOLECTOMY Right 03/13/2019   Procedure: RIGHT BRACHIAL, RADIAL, ULNAR EMBOLECTOMY;  Surgeon: Elam Dutch, MD;  Location: [facility] OR;  Service: Vascular;  Laterality: Right;  . left  lowerlung lobectomy Left 06/19/2006   Dr.Burney,Left lower lobectomy  . MYRINGOTOMY Bilateral 06/25/2016  . Porta-cath  2011     OB History   No obstetric history on file.      Home Medications    Prior to Admission medications   Medication Sig Start Date End Date Taking? Authorizing Provider  albuterol (PROVENTIL HFA;VENTOLIN HFA) 108 (90 Base) MCG/ACT inhaler Inhale 1-2 puffs into the lungs every 6 (six) hours as needed for wheezing or shortness of breath. 11/12/17   Maryanna Shape, NP  amiodarone (PACERONE) 200 MG tablet Take 1 tablet (200 mg total) by mouth 2 (two) times daily. 200 mg BID until10/18/2020,  then 200 mg QD. 03/16/19   Ulyses Amor, [name]  apixaban (ELIQUIS) 2.5 MG TABS tablet Take 1 tablet (2.5 mg total) by mouth 2 (two) times daily. 03/16/19   Ulyses Amor, [name]  chlorpheniramine-HYDROcodone (TUSSIONEX) 10-8 MG/5ML SUER Take 5 mLs by mouth 2 (two) times daily as needed for cough. 03/31/19   Curt Bears, MD  metoprolol succinate (TOPROL-XL) 50 MG 24 hr tablet Take 1 tablet (50 mg total) by mouth daily. Take with or immediately following a meal.  Refills per cardiology or primary care 03/17/19   Ulyses Amor, [name]  Multiple Vitamins-Minerals (CENTRUM SILVER PO) Take 1 tablet by mouth daily. Reported on 12/13/2015    [provider]  vitamin C (ASCORBIC ACID) 500 MG tablet Take 500 mg by mouth daily.    [provider]    Family History Family History  Problem Relation Age of Onset  . Hypertension Father     Social History Social History   Tobacco Use  . Smoking status: Never Smoker  . Smokeless tobacco: Never Used  Substance Use Topics  . Alcohol use: No  . Drug use: No     Allergies   Simvastatin   Review of Systems Review of Systems  All other systems reviewed and are negative.    Physical Exam Updated Vital Signs BP (!) 160/71 (BP Location: Left Arm)   Pulse (!) 54   Temp 97.7 F (36.5 C) (Oral)   Resp 19   SpO2 94%   Physical Exam Vitals signs and nursing note reviewed.  Constitutional:      General: She is not in acute distress.    Appearance: Normal appearance. She is well-developed.  HENT:     Head: Normocephalic and atraumatic.  Eyes:     Conjunctiva/sclera: Conjunctivae normal.     Pupils: Pupils are equal, round, and reactive to light.  Neck:     Musculoskeletal: Normal range of motion and neck supple. No muscular tenderness.  Cardiovascular:     Rate and Rhythm: Normal rate and regular rhythm.     Heart sounds: No murmur.  Pulmonary:     Effort: Pulmonary effort is normal. No respiratory distress.     Breath sounds: Normal breath sounds. No wheezing or rales.  Abdominal:     General: There is no distension.     Palpations: Abdomen is soft.     Tenderness: There is no abdominal tenderness. There is no guarding or rebound.  Musculoskeletal:        General: Tenderness present.     Right shoulder: Normal.     Left shoulder: Normal.     Right hip: She exhibits decreased range of motion, tenderness and bony tenderness.     Left hip: Normal.     Comments: No pain with axial loading of the right foot.  Normal range of motion  and sensation of bilateral ankles and knees.  Lymphadenopathy:     Cervical: No cervical adenopathy.  Skin:    General: Skin is warm and dry.     Findings: No erythema or rash.  Neurological:     General: No focal deficit present.     Mental Status: She is alert and oriented to person, place, and time. Mental status is at baseline.  Psychiatric:        Mood and Affect: Mood normal.        Behavior: Behavior normal.        Thought Content: Thought content normal.  ED Treatments / Results  Labs (all labs ordered are listed, but only abnormal results are displayed) Labs Reviewed  BASIC METABOLIC PANEL - Abnormal; Notable for the following components:      Result Value   Glucose, Bld 157 (*)    GFR calc non Af Amer 56 (*)    All other components within normal limits  PROTIME-INR - Abnormal; Notable for the following components:   Prothrombin Time 15.3 (*)    All other components within normal limits  SARS CORONAVIRUS 2 (TAT 6-24 HRS)  CBC WITH DIFFERENTIAL/PLATELET  TYPE AND SCREEN  ABO/RH    EKG EKG Interpretation  Date/Time:  Wednesday April 14 2019 08:58:22 EST Ventricular Rate:  54 PR Interval:    QRS Duration: 113 QT Interval:  540 QTC Calculation: 512 R Axis:   -30 Text Interpretation: Sinus rhythm Left ventricular hypertrophy Abnrm T, consider ischemia, anterolateral lds Prolonged QT interval Baseline wander in lead(s) I aVL No significant change since last tracing Confirmed by Blanchie Dessert ([id number]) on 04/14/2019 9:55:59 AM   Radiology Dg Chest 1 View  Result Date: 04/14/2019 CLINICAL DATA:  [place] backwards this morning. EXAM: CHEST  1 VIEW COMPARISON:  03/22/2019 FINDINGS: Right IJ Port-A-Cath unchanged. Lungs are adequately inflated with interval improvement in right effusion with only small residual amount of right pleural fluid. Known right paramediastinal mass unchanged. Lungs are otherwise clear. Cardiomediastinal silhouette and remainder of the  exam is unchanged. IMPRESSION: 1. No acute cardiopulmonary disease. Interval improved right pleural effusion with small residual amount of pleural fluid. 2.  Stable right paramediastinal masslike density. Electronically Signed   By: Marin Olp M.D.   On: 04/14/2019 09:40   Dg Hip Unilat With Pelvis 2-3 Views Right  Result Date: 04/14/2019 CLINICAL DATA:  [place] backwards today while going to the bathroom. EXAM: DG HIP (WITH OR WITHOUT PELVIS) 2-3V RIGHT COMPARISON:  None. FINDINGS: Diffuse osteopenia. Minimal degenerative changes of the hips. There is a displaced fracture over the medial portion of the right acetabulum as well as minimally displaced fracture of the right inferior pubic ramus. IMPRESSION: Displaced fractures of the medial right acetabulum and right inferior pubic ramus. Electronically Signed   By: Marin Olp M.D.   On: 04/14/2019 09:37    Procedures Procedures (including critical care time)  Medications Ordered in ED Medications  ondansetron (ZOFRAN) injection 4 mg (has no administration in time range)  fentaNYL (SUBLIMAZE) injection 25 mcg (has no administration in time range)     Initial Impression / Assessment and Plan / ED Course  I have reviewed the triage vital signs and the nursing notes.  Pertinent labs & imaging results that were available during my care of the patient were reviewed by me and considered in my medical decision making (see chart for details).        Patient with a fall at home after she lost her balance and fell backwards.  Complaint of right hip pain.  No significant deformity but pain with any range of motion of the hip.  Concern for potential acetabular fracture or occult hip fracture.  Patient denies any injury to the head or neck.  She does take Eliquis.  Right lower extremity is neurovascularly intact.  Hip fracture protocol initiated.  Patient given pain control.  9:57 AM Labs and EKG without acute findings, chest x-ray is unchanged,  hip images show displaced fractures of the medial right acetabulum and right inferior pubic ramus.  Will discuss with orthopedics.  10:27 AM Spoke  with Dr. Erlinda Hong who dates that patient is nonoperative but needs to be nonweightbearing.  Will admit for pain control and physical therapy  Final Clinical Impressions(s) / ED Diagnoses   Final diagnoses:  Closed displaced fracture of medial wall of right acetabulum, initial encounter ([facility])  Closed fracture of multiple rami of right pubis, initial encounter [facility])    ED Discharge Orders    None       Blanchie Dessert, MD 04/14/19 1027

## 2019-04-14 NOTE — ED Notes (Signed)
Purwick placed on patient.

## 2019-04-14 NOTE — Progress Notes (Signed)
I have reviewed xrays.  Patient not a surgical candidate given age, comorbidities and morbidity of surgery.  nonop treatment with symptomatic treatment.  Mobilize with PT when able.  NWB RLE.  Anticipate will need SNF.  Follow up in office in 2 weeks for repeat xrays.   [name] Cecil, MD [facility] [phone] 12:37 PM

## 2019-04-14 NOTE — Progress Notes (Signed)
Initial Nutrition Assessment  RD working remotely.  DOCUMENTATION CODES:   Not applicable, suspect some degree of malnutrition but unable to confirm at this time  INTERVENTION:   - Continue MVI with minerals daily  - [employer] Instant Breakfast Essentials TID with meals, each supplement provides 240 kcal and 10 grams of protein  - Pro-stat 30 ml daily, each supplement provides 100 kcal and 15 grams of protein  NUTRITION DIAGNOSIS:   Increased nutrient needs related to cancer and cancer related treatments as evidenced by estimated needs.  GOAL:   Patient will meet greater than or equal to 90% of their needs  MONITOR:   PO intake, Supplement acceptance, Weight trends  REASON FOR ASSESSMENT:   Consult Other (poor intake due to altered taste from chemo, ongoing weight loss, BMI under 20)  ASSESSMENT:   85 year old female who presented to the ED on 11/11 after a fall. PMH of non-small cell lung cancer currently receiving chemotherapy, atrial fibrillation, recent radial artery embolus s/p embolectomy. Images showing displaced fractures of the medial right acetabulum and right inferior pubic ramus.   Per Orthopedics, pt is not a surgical candidate given age and comorbidities. Ortho recommending non-operative treatment.  Per H&P, pt reports having "altered sense of taste which leads to poor appetite and she has had subsequent weight loss."  Spoke with pt via phone call to room. Pt reports that she has a good appetite. Pt states "but I don't eat a lot anyway." Pt denies any changes in her appetite recently.  When asked about her PO intake on a daily basis, pt reports typically eating 2-3 meals daily at home. A meal may include chicken from [employer] or an egg salad sandwich with milk. Pt states that she snacks only occasionally ("it depends on what it is").  Pt reports that she does drink 1-2 Radiation protection practitioner at home. Pt states that she does not like other  oral nutrition supplements like Ensure Enlive. RD will order Carnation Instant Breakfast TID with meal trays. Pt prefers the chocolate flavor.  Pt reports that she has been slowly and steadily losing weight "ever since I got diagnosed with lung cancer." Pt reports that she does not have a UBW.  Reviewed weight history in chart. Noted steady decline in weight lover the last 11 months. Overall, pt has lost 5.7 kg since 05/25/18. This is a 10.5% weight loss which is not significant for timeframe. Although not significant, pt's weight loss is still concerning given it has been progressive over a long period of time. Pt is at risk for malnutrition and may already meet criteria but RD unable to confirm without completing NFPE.  Pt denies any N/V or abdominal pain. She also denies any issues with her bowels.  Pt willing to drink [employer] Essentials with meals while admitted but states she thinks she will be leaving for a rehab facility tomorrow.  RD discussed the importance of adequate kcal and protein intake in preventing additional weight loss and maintaining lean muscle mass. Pt expresses understanding.  RD will also order Pro-stat daily with morning meds for an additional 15 grams of protein.  Medications reviewed and include: MVI with minerals, vitamin C 500 mg daily  Labs reviewed.  NUTRITION - FOCUSED PHYSICAL EXAM:  Unable to complete at this time. RD working remotely.  Diet Order:   Diet Order            Diet regular Room service appropriate? Yes; Fluid consistency: Thin  Diet effective  now              EDUCATION NEEDS:   Education needs have been addressed  Skin:  Skin Assessment: Reviewed RN Assessment  Last BM:  04/13/19  Height:   Ht Readings from Last 1 Encounters:  04/08/19 5\' 2"  (1.575 m)    Weight:   Wt Readings from Last 1 Encounters:  04/08/19 48.6 kg    Ideal Body Weight:  50 kg  BMI:  19.58 kg/m^2  Estimated Nutritional Needs:    Kcal:  1300-1500  Protein:  60-75 grams  Fluid:  1.3-1.5 L    [name] Face, MS, RD, LDN Inpatient Clinical Dietitian Pager: [phone] Weekend/After Hours: [phone]

## 2019-04-15 LAB — OSMOLALITY: Osmolality: 289 mOsm/kg (ref 275–295)

## 2019-04-15 LAB — BASIC METABOLIC PANEL
Anion gap: 8 (ref 5–15)
BUN: 20 mg/dL (ref 8–23)
CO2: 23 mmol/L (ref 22–32)
Calcium: 8 mg/dL — ABNORMAL LOW (ref 8.9–10.3)
Chloride: 98 mmol/L (ref 98–111)
Creatinine, Ser: 0.89 mg/dL (ref 0.44–1.00)
GFR calc Af Amer: >60 mL/min (ref >60)
GFR calc non Af Amer: 60 mL/min — ABNORMAL LOW (ref >60)
Glucose, Bld: 102 mg/dL — ABNORMAL HIGH (ref 70–99)
Potassium: 3.3 mmol/L — ABNORMAL LOW (ref 3.5–5.1)
Sodium: 129 mmol/L — ABNORMAL LOW (ref 135–145)

## 2019-04-15 LAB — CBC
HCT: 37.1 % (ref 36.0–46.0)
Hemoglobin: 12.2 g/dL (ref 12.0–15.0)
MCH: 32.4 pg (ref 26.0–34.0)
MCHC: 32.9 g/dL (ref 30.0–36.0)
MCV: 98.4 fL (ref 80.0–100.0)
Platelets: 196 10*3/uL (ref 150–400)
RBC: 3.77 MIL/uL — ABNORMAL LOW (ref 3.87–5.11)
RDW: 14.7 % (ref 11.5–15.5)
WBC: 6.1 10*3/uL (ref 4.0–10.5)
nRBC: 0 % (ref 0.0–0.2)

## 2019-04-15 MED ORDER — ACETAMINOPHEN 325 MG PO TABS
650.0000 mg | ORAL_TABLET | Freq: Two times a day (BID) | ORAL | Status: DC
  Administered 2019-04-15 – 2019-04-16 (×3): 650 mg via ORAL
  Filled 2019-04-15 (×3): qty 2

## 2019-04-15 NOTE — Progress Notes (Signed)
Nutrition Brief Note  RD consulted for assessment of nutrition requirements/status. Please see RD Initial Nutrition Assessment dated 11/11 for details and interventions. RD will continue to follow during admission.   [name], MS, RD, LDN Inpatient Clinical Dietitian Pager: [phone] Weekend/After Hours: [phone]

## 2019-04-15 NOTE — Progress Notes (Signed)
PROGRESS NOTE    [name]  BJY:[id number] DOB: [birth year] DOA: 04/14/2019 PCP: [name], MD      Brief Narrative:  [name] is an 85 y.o. F with recurrent NSCLC on nivolumab, recent brachial artery embolization due to Afib on Eliquis, who presented with mechanical fall, hip pain and inability to walk.  The patient had gotten out of bed at 4AM and had mechanical fall.  In the ER, radiographs showed a displaced right medial acetabular fracture and inferior pubic ramus fracture.  Dr. Erlinda Hong was consulted who felt the patient was not an operative candidate, recommended NWB to right LE.       Assessment & Plan:  Acute right medial acetabular fracture Pubic raums fracture Pain controlled without opiates. -NWB to RLE -PT/OT -Scheduled acetaminophen   Hyponatremia Na down to 129 from normal overnight. Asymptomatic. BUN to creatinine up.   -Obtain urine electrolytes -Fluid restriction -Avoid opiates  Paroxysmal atrial fibrillation HR controlled -Continue Eliquis -Continue amiodarone and metoprolol  NSCLC  At risk of undernutrition BMI 19 -Nutrition consult       MDM and disposition: The below labs and imaging reports were reviewed and summarized above.  Medication management as above.  The patient was admitted with acetabular fracture and pubic ramus fracture.  The patient lives independently at home, is independent for all self-cares.  Due to the nature of her fracture, she should be nonweightbearing for at least the next 2 weeks, and will be dependent for all transfers, toileting, dressing, and feeding.  In light of this, skilled nursing rehab is our only current safe discharge option.         DVT prophylaxis: N/A on Eliquis Code Status: Full code Family Communication: Daughter by phone    Consultants:   Orthopedics, Dr. Erlinda Hong  Procedures:     Antimicrobials:       Subjective: The patient is without complaints.  She has some pelvic  pain but only when she moves, this is well controlled with Tylenol.  She has no confusion, dizziness, headache, vomiting, or nausea.  Objective: Vitals:   04/15/19 0209 04/15/19 0520 04/15/19 0700 04/15/19 0904  BP: (!) 114/57 (!) 145/67  (!) 120/58  Pulse: (!) 47 (!) 48  (!) 52  Resp: 14 18    Temp:  98.4 F (36.9 C)    TempSrc:  Oral    SpO2: 95% 95%    Weight:   48 kg   Height:        Intake/Output Summary (Last 24 hours) at 04/15/2019 0949 Last data filed at 04/15/2019 0900 Gross per 24 hour  Intake 720 ml  Output 400 ml  Net 320 ml   Filed Weights   04/14/19 1537 04/15/19 0700  Weight: 49.5 kg 48 kg    Examination: General appearance: Very thin, elderly frail adult female, alert and in no acute distress.   HEENT: Anicteric, conjunctiva pink, lids and lashes normal. No nasal deformity, discharge, epistaxis.  Lips moist, oropharynx moist, no oral lesions, hearing normal.   Skin: Warm and dry.  No jaundice.  No suspicious rashes or lesions. Cardiac: Slow, regular, nl S1-S2, no murmurs appreciated.  Capillary refill is brisk.  JVP not visible.No LE edema.  Radial pulses 2+ and symmetric. Respiratory: Normal respiratory rate and rhythm.  CTAB without rales or wheezes. Abdomen: Abdomen soft.  No tenderness to palpation or guarding. No ascites, distension, hepatosplenomegaly.   MSK: No deformities or effusions. Neuro: Awake and alert.  EOMI, moves upper extremities  with normal strength and coordination for age, unable to move the legs without pain. Speech fluent.    Psych: Sensorium intact and responding to questions, attention normal. Affect normal.  Judgment and insight appear normal.    Data Reviewed: I have personally reviewed following labs and imaging studies:  CBC: Recent Labs  Lab 04/14/19 0912 04/15/19 0211  WBC 8.0 6.1  NEUTROABS 6.9  --   HGB 14.8 12.2  HCT 45.7 37.1  MCV 98.5 98.4  PLT 224 448   Basic Metabolic Panel: Recent Labs  Lab 04/14/19 0912  04/15/19 0211  NA 135 129*  K 4.3 3.3*  CL 102 98  CO2 25 23  GLUCOSE 157* 102*  BUN 21 20  CREATININE 0.94 0.89  CALCIUM 9.1 8.0*   GFR: Estimated Creatinine Clearance: 35.7 mL/min (by C-G formula based on SCr of 0.89 mg/dL). Liver Function Tests: No results for input(s): AST, ALT, ALKPHOS, BILITOT, PROT, ALBUMIN in the last 168 hours. No results for input(s): LIPASE, AMYLASE in the last 168 hours. No results for input(s): AMMONIA in the last 168 hours. Coagulation Profile: Recent Labs  Lab 04/14/19 0912  INR 1.2   Cardiac Enzymes: No results for input(s): CKTOTAL, CKMB, CKMBINDEX, TROPONINI in the last 168 hours. BNP (last 3 results) No results for input(s): PROBNP in the last 8760 hours. HbA1C: No results for input(s): HGBA1C in the last 72 hours. CBG: No results for input(s): GLUCAP in the last 168 hours. Lipid Profile: No results for input(s): CHOL, HDL, LDLCALC, TRIG, CHOLHDL, LDLDIRECT in the last 72 hours. Thyroid Function Tests: No results for input(s): TSH, T4TOTAL, FREET4, T3FREE, THYROIDAB in the last 72 hours. Anemia Panel: No results for input(s): VITAMINB12, FOLATE, FERRITIN, TIBC, IRON, RETICCTPCT in the last 72 hours. Urine analysis:    Component Value Date/Time   LABSPEC 1.030 03/17/2012 1051   PHURINE 5.0 03/17/2012 1051   HGBUR Negative 03/17/2012 1051   BILIRUBINUR Negative 03/17/2012 1051   KETONESUR Negative 03/17/2012 1051   PROTEINUR Negative 03/17/2012 1051   NITRITE Negative 03/17/2012 1051   LEUKOCYTESUR Negative 03/17/2012 1051   Sepsis Labs: @LABRCNTIP (procalcitonin:4,lacticacidven:4)  ) Recent Results (from the past 240 hour(s))  SARS CORONAVIRUS 2 (TAT 6-24 HRS) Nasopharyngeal Nasopharyngeal Swab     Status: None   Collection Time: 04/14/19 10:44 AM   Specimen: Nasopharyngeal Swab  Result Value Ref Range Status   SARS Coronavirus 2 NEGATIVE NEGATIVE Final    Comment: (NOTE) SARS-CoV-2 target nucleic acids are NOT DETECTED.  The SARS-CoV-2 RNA is generally detectable in upper and lower respiratory specimens during the acute phase of infection. Negative results do not preclude SARS-CoV-2 infection, do not rule out co-infections with other pathogens, and should not be used as the sole basis for treatment or other patient management decisions. Negative results must be combined with clinical observations, patient history, and epidemiological information. The expected result is Negative. Fact Sheet for Patients: SugarRoll.be Fact Sheet for Healthcare Providers: https://www.woods-mathews.com/ This test is not yet approved or cleared by the [place] FDA and  has been authorized for detection and/or diagnosis of SARS-CoV-2 by FDA under an Emergency Use Authorization (EUA). This EUA will remain  in effect (meaning this test can be used) for the duration of the COVID-19 declaration under Section 56 4(b)(1) of the Act, 21 U.S.C. section 360bbb-3(b)(1), unless the authorization is terminated or revoked sooner. Performed at [facility], [place] [address], [place], [place] [address]          Radiology  Studies: Dg Chest 1 View  Result Date: 04/14/2019 CLINICAL DATA:  [place] backwards this morning. EXAM: CHEST  1 VIEW COMPARISON:  03/22/2019 FINDINGS: Right IJ Port-A-Cath unchanged. Lungs are adequately inflated with interval improvement in right effusion with only small residual amount of right pleural fluid. Known right paramediastinal mass unchanged. Lungs are otherwise clear. Cardiomediastinal silhouette and remainder of the exam is unchanged. IMPRESSION: 1. No acute cardiopulmonary disease. Interval improved right pleural effusion with small residual amount of pleural fluid. 2.  Stable right paramediastinal masslike density. Electronically Signed   By: Marin Olp M.D.   On: 04/14/2019 09:40   Dg Pelvis 1-2 Views  Result Date: 04/14/2019 CLINICAL DATA:  Pelvic  fracture. EXAM: PELVIS - 1-2 VIEW COMPARISON:  Same day. FINDINGS: Mildly displaced fracture is noted at the junction of the right superior pubic ramus and acetabulum. Probable minimally displaced fracture is seen involving the right inferior pubic ramus. Hip joints are unremarkable. IMPRESSION: Mildly displaced fracture is noted at the junction of the right superior pubic ramus and acetabulum. Probable minimally displaced fracture is seen involving right inferior pubic ramus. Electronically Signed   By: Marijo Conception M.D.   On: 04/14/2019 13:08   Dg Hip Unilat With Pelvis 2-3 Views Right  Result Date: 04/14/2019 CLINICAL DATA:  [place] backwards today while going to the bathroom. EXAM: DG HIP (WITH OR WITHOUT PELVIS) 2-3V RIGHT COMPARISON:  None. FINDINGS: Diffuse osteopenia. Minimal degenerative changes of the hips. There is a displaced fracture over the medial portion of the right acetabulum as well as minimally displaced fracture of the right inferior pubic ramus. IMPRESSION: Displaced fractures of the medial right acetabulum and right inferior pubic ramus. Electronically Signed   By: Marin Olp M.D.   On: 04/14/2019 09:37        Scheduled Meds: . amiodarone  200 mg Oral Daily  . apixaban  2.5 mg Oral BID  . feeding supplement (PRO-STAT SUGAR FREE 64)  30 mL Oral Daily  . metoprolol succinate  50 mg Oral Daily  . multivitamin with minerals  1 tablet Oral Daily  . sodium chloride flush  3 mL Intravenous Q12H  . vitamin C  500 mg Oral Daily   Continuous Infusions:   LOS: 1 day    Time spent: 25 minutes    Edwin Dada, MD [facility] 04/15/2019, 9:49 AM     Please page through [place]:  [email or web address] Password [email or web address] If 7PM-7AM, please contact night-coverage

## 2019-04-15 NOTE — Evaluation (Signed)
Physical Therapy Evaluation Patient Details Name: [name] MRN: [id number] DOB: 03-30-1933 Today's Date: 04/15/2019   History of Present Illness  85 y.o. female with medical history significant of atrial fibrillation complicated by radial artery embolus status post embolectomy, now on anticoagulation, non-small cell lung cancer on chemotherapy and admitted after mechanical fall at home. In the ER, radiographs showed a displaced right medial acetabular fracture and inferior pubic ramus fracture.  Dr. Erlinda Hong was consulted who felt the patient was not an operative candidate, recommended NWB to right LE.  Clinical Impression  Pt admitted with above diagnosis.  Pt currently with functional limitations due to the deficits listed below (see PT Problem List). Pt will benefit from skilled PT to increase their independence and safety with mobility to allow discharge to the venue listed below.   Pt agreeable to mobilize as tolerated.  Pt aware of NWB status however unable to stand today due to weakness and pain.  Pt lives alone with daughter assisting when needed (lives next door).  Pt would benefit from SNF upon d/c.       Follow Up Recommendations SNF    Equipment Recommendations  Other (comment)(maybe w/c if home, however defer to next venue)    Recommendations for Other Services       Precautions / Restrictions Precautions Precautions: Fall Restrictions Weight Bearing Restrictions: Yes RLE Weight Bearing: Non weight bearing      Mobility  Bed Mobility Overal bed mobility: Needs Assistance Bed Mobility: Supine to Sit;Sit to Supine     Supine to sit: Mod assist;HOB elevated Sit to supine: Mod assist   General bed mobility comments: verbal cues for technique, assist for R LE and trunk upright; assist for LEs onto bed  Transfers Overall transfer level: Needs assistance Equipment used: Rolling walker (2 wheeled) Transfers: Sit to/from Stand Sit to Stand: Total assist          General transfer comment: attempted however pt unable to rise from bed surface and maintain NWB  Ambulation/Gait                Stairs            Wheelchair Mobility    Modified Rankin (Stroke Patients Only)       Balance Overall balance assessment: History of Falls                                           Pertinent Vitals/Pain Pain Assessment: Faces Faces Pain Scale: Hurts even more Pain Location: R hip/groin Pain Descriptors / Indicators: Sore;Discomfort;Grimacing Pain Intervention(s): Monitored during session;RN gave pain meds during session;Repositioned    Home Living Family/patient expects to be discharged to:: Private residence Living Arrangements: Alone(daughter lives next door) Available Help at Discharge: Family;Available PRN/intermittently Type of Home: House       Home Layout: One level Home Equipment: None      Prior Function Level of Independence: Independent               Hand Dominance        Extremity/Trunk Assessment        Lower Extremity Assessment Lower Extremity Assessment: RLE deficits/detail RLE Deficits / Details: pt unable to move actively due to pain, required assist, able to perform ankle pumps       Communication   Communication: No difficulties  Cognition Arousal/Alertness: Awake/alert Behavior During Therapy: [facility] for  tasks assessed/performed Overall Cognitive Status: Within Functional Limits for tasks assessed                                        General Comments      Exercises     Assessment/Plan    PT Assessment Patient needs continued PT services  PT Problem List Decreased strength;Decreased mobility;Pain;Decreased balance;Decreased knowledge of use of DME;Decreased knowledge of precautions       PT Treatment Interventions DME instruction;Therapeutic activities;Gait training;Therapeutic exercise;Patient/family education;Functional mobility  training;Balance training;Wheelchair mobility training;Stair training    PT Goals (Current goals can be found in the Care Plan section)  Acute Rehab PT Goals PT Goal Formulation: With patient Time For Goal Achievement: 04/29/19 Potential to Achieve Goals: Good    Frequency Min 3X/week   Barriers to discharge        Co-evaluation               AM-PAC PT "6 Clicks" Mobility  Outcome Measure Help needed turning from your back to your side while in a flat bed without using bedrails?: A Lot Help needed moving from lying on your back to sitting on the side of a flat bed without using bedrails?: A Lot Help needed moving to and from a bed to a chair (including a wheelchair)?: Total Help needed standing up from a chair using your arms (e.g., wheelchair or bedside chair)?: Total Help needed to walk in hospital room?: Total Help needed climbing 3-5 steps with a railing? : Total 6 Click Score: 8    End of Session Equipment Utilized During Treatment: Gait belt Activity Tolerance: Patient tolerated treatment well Patient left: in bed;with bed alarm set;with call bell/phone within reach   PT Visit Diagnosis: Unsteadiness on feet (R26.81);Other abnormalities of gait and mobility (R26.89)    Time: 1038-1100 PT Time Calculation (min) (ACUTE ONLY): 22 min   Charges:   PT Evaluation $PT Eval Low Complexity: [place], PT, DPT Acute Rehabilitation Services Office: [phone] Pager: [phone]  Trena Platt 04/15/2019, 11:17 AM

## 2019-04-15 NOTE — Plan of Care (Signed)
  Problem: Health Behavior/Discharge Planning: Goal: Ability to manage health-related needs will improve Outcome: Progressing   Problem: Clinical Measurements: Goal: Ability to maintain clinical measurements within normal limits will improve Outcome: Progressing Goal: Will remain free from infection Outcome: Progressing Goal: Diagnostic test results will improve Outcome: Progressing Goal: Respiratory complications will improve Outcome: Progressing

## 2019-04-15 NOTE — NC FL2 (Signed)
[place] MEDICAID FL2 LEVEL OF CARE SCREENING TOOL     IDENTIFICATION  Patient Name: [name] Birthdate: 06-22-1932 Sex: female Admission Date (Current Location): 04/14/2019  [facility] [place] Number:  Herbalist and Address:  [facility],  [place], [place]      Provider Number: [id number]  Attending Physician Name and Address:  Edwin Dada, *  Relative Name and Phone Number:    Marybelle Killings    [phone], [phone], [phone]      Current Level of Care: Hospital Recommended Level of Care: [place] Prior Approval Number:    Date Approved/Denied:   PASRR Number:   [id number]  Discharge Plan: SNF    Current Diagnoses: Patient Active Problem List   Diagnosis Date Noted  . Acetabular fracture ([place]) 04/14/2019  . Brachial artery embolus ([place]) 03/12/2019  . Port-A-Cath in place 12/10/2017  . Goals of care, counseling/discussion 09/10/2017  . Encounter for antineoplastic immunotherapy 09/10/2017  . Bronchitis 08/16/2016  . Radiation pneumonitis ([place]) 01/24/2016  . Encounter for antineoplastic chemotherapy 12/14/2014  . Bilateral lung cancer ([place]) 04/08/2011    Orientation RESPIRATION BLADDER Height & Weight     Self, Time, Situation, Place  Normal Continent Weight: 105 lb 13.1 oz (48 kg) Height:  5\' 2"  (157.5 cm)  BEHAVIORAL SYMPTOMS/MOOD NEUROLOGICAL BOWEL NUTRITION STATUS      Continent (Regular Diet)  AMBULATORY STATUS COMMUNICATION OF NEEDS Skin   Extensive Assist Verbally Normal                       Personal Care Assistance Level of Assistance  Bathing, Dressing, Feeding Bathing Assistance: Maximum assistance Feeding assistance: Independent Dressing Assistance: Maximum assistance     Functional Limitations Info  Sight, Hearing, Speech Sight Info: Adequate Hearing Info: Adequate Speech Info: Adequate    SPECIAL CARE FACTORS FREQUENCY  PT (By  licensed PT), OT (By licensed OT)     PT Frequency: 5X/WEEK OT Frequency: 5X/WEEK            Contractures Contractures Info: Not present    Additional Factors Info  Code Status, Allergies, Psychotropic Code Status Info: Fullcode Allergies Info: Allergies: Simvastatin           Current Medications (04/15/2019):  This is the current hospital active medication list Current Facility-Administered Medications  Medication Dose Route Frequency Provider Last Rate Last Dose  . acetaminophen (TYLENOL) tablet 650 mg  650 mg Oral Q6H PRN Truddie Hidden, MD   650 mg at 04/14/19 1644   Or  . acetaminophen (TYLENOL) suppository 650 mg  650 mg Rectal Q6H PRN Truddie Hidden, MD      . acetaminophen (TYLENOL) tablet 650 mg  650 mg Oral BID Edwin Dada, MD   650 mg at 04/15/19 1048  . albuterol (PROVENTIL) (2.5 MG/3ML) 0.083% nebulizer solution 2.5 mg  2.5 mg Inhalation Q6H PRN Truddie Hidden, MD      . amiodarone (PACERONE) tablet 200 mg  200 mg Oral Daily Truddie Hidden, MD   200 mg at 04/15/19 0908  . apixaban (ELIQUIS) tablet 2.5 mg  2.5 mg Oral BID Truddie Hidden, MD   2.5 mg at 04/15/19 0910  . feeding supplement (PRO-STAT SUGAR FREE 64) liquid 30 mL  30 mL Oral Daily Truddie Hidden, MD   30 mL at 04/15/19 0907  . metoprolol succinate (TOPROL-XL) 24 hr tablet 50 mg  50 mg Oral Daily Truddie Hidden, MD      .  multivitamin with minerals tablet 1 tablet  1 tablet Oral Daily Truddie Hidden, MD   1 tablet at 04/15/19 0909  . sodium chloride flush (NS) 0.9 % injection 3 mL  3 mL Intravenous Q12H Truddie Hidden, MD   3 mL at 04/15/19 0915  . vitamin C (ASCORBIC ACID) tablet 500 mg  500 mg Oral Daily Truddie Hidden, MD   500 mg at 04/15/19 7737     Discharge Medications: Please see discharge summary for a list of discharge medications.  Relevant Imaging Results:  Relevant Lab Results:   Additional Information ssn: [id number]  Lia Hopping, LCSW

## 2019-04-15 NOTE — TOC Initial Note (Signed)
Transition of Care [facility]) - Initial/Assessment Note    Patient Details  Name: [name] MRN: [id number] Date of Birth: 1933/05/17  Transition of Care [facility]) CM/SW Contact:    Lia Hopping, [place] Phone Number: 04/15/2019, 1:57 PM  Clinical Narrative:                 CSW met with the patient and her daughter at bedside. Patient agreeable to go to SNF for rehab. Patient reports she lives alone and her daughter lives "33 [address] away, next door." Patient reports at home she uses a cane sometime and is independent with all ADL's. Patient states, " physical  therapy will give me the strength I need to continue to do things for myself." Patient is currently receiving monthly chemotherapy. Patient has an upcoming appointment on 11/23 and 11/25. Patient provided CSW with SNF choice. CSW explain SNF process/ insurance authorization. Patient and daughter reports understanding.   Expected Discharge Plan: Skilled Nursing Facility Barriers to Discharge: Insurance Authorization   Patient Goals and CMS Choice   CMS Medicare.gov Compare Post Acute Care list provided to:: Patient Choice offered to / list presented to : Patient, Adult Children  Expected Discharge Plan and Services Expected Discharge Plan: [place] In-house Referral: Clinical Social Work Discharge Planning Services: Follow-up appt scheduled Post Acute Care Choice: [place] arrangements for the past 2 months: Single Family Home                                      Prior Living Arrangements/Services Living arrangements for the past 2 months: Single Family Home Lives with:: Self Patient language and need for interpreter reviewed:: No Do you feel safe going back to the place where you live?: Yes      Need for Family Participation in Patient Care: Yes (Comment) Care giver support system in place?: Yes (comment) Current home services: DME Criminal Activity/Legal Involvement  Pertinent to Current Situation/Hospitalization: No - Comment as needed  Activities of Daily Living Home Assistive Devices/Equipment: Cane (specify quad or straight) ADL Screening (condition at time of admission) Patient's cognitive ability adequate to safely complete daily activities?: Yes Is the patient deaf or have difficulty hearing?: No Does the patient have difficulty seeing, even when wearing glasses/contacts?: No Does the patient have difficulty concentrating, remembering, or making decisions?: No Patient able to express need for assistance with ADLs?: Yes Does the patient have difficulty dressing or bathing?: No Independently performs ADLs?: Yes (appropriate for developmental age) Does the patient have difficulty walking or climbing stairs?: No Weakness of Legs: Right Weakness of Arms/Hands: None  Permission Sought/Granted Permission sought to share information with : Facility Sport and exercise psychologist Permission granted to share information with : Yes, Verbal Permission Granted  Share Information with NAME: Griffin,Michelle     Permission granted to share info w Relationship: Daughter  Permission granted to share info w Contact Information: [phone]  Emotional Assessment Appearance:: Appears stated age Attitude/Demeanor/Rapport: Engaged Affect (typically observed): Accepting, Pleasant, Calm Orientation: : Oriented to Self, Oriented to Place, Oriented to  Time, Oriented to Situation Alcohol / Substance Use: Not Applicable Psych Involvement: No (comment)  Admission diagnosis:  Fracture [T14.8XXA] Closed displaced fracture of medial wall of right acetabulum, initial encounter ([place]) [S32.471A] Closed fracture of multiple rami of right pubis, initial encounter [facility]) [S32.591A] Patient Active Problem List   Diagnosis Date Noted  . Acetabular fracture (  [place]) 04/14/2019  . 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 Port-A-Cath in place 12/10/2017  .  Goals of care, counseling/discussion 09/10/2017  . Encounter for antineoplastic immunotherapy 09/10/2017  . Bronchitis 08/16/2016  . Radiation pneumonitis ([place]) 01/24/2016  . Encounter for antineoplastic chemotherapy 12/14/2014  . Bilateral lung cancer ([place]) 04/08/2011   PCP:  [name], MD Pharmacy:   [id number] [place], [place] [address] [place] [address]Phone: [phone]: [phone] [address]Phone: [phone]: [phone]    Social Determinants of Health ([place] Interventions    Readmission Risk Interventions Readmission Risk Prevention Plan 03/16/2019  Transportation Screening Complete  PCP or Specialist Appt within 5-7 Days Complete  Home Care Screening Complete  Medication Review (RN CM) Complete  Some recent data might be hidden

## 2019-04-15 NOTE — Plan of Care (Signed)
  Problem: Health Behavior/Discharge Planning: Goal: Ability to manage health-related needs will improve Outcome: Progressing   Problem: Clinical Measurements: Goal: Ability to maintain clinical measurements within normal limits will improve Outcome: Progressing Goal: Will remain free from infection 04/15/2019 2005 by Hubert Azure, RN Outcome: Progressing 04/15/2019 2004 by Hubert Azure, RN Outcome: Progressing Goal: Diagnostic test results will improve 04/15/2019 2005 by Hubert Azure, RN Outcome: Progressing 04/15/2019 2004 by Hubert Azure, RN Outcome: Progressing Goal: Respiratory complications will improve 04/15/2019 2005 by Hubert Azure, RN Outcome: Progressing 04/15/2019 2004 by Hubert Azure, RN Outcome: Progressing

## 2019-04-15 NOTE — Evaluation (Signed)
Occupational Therapy Evaluation Patient Details Name: [name] MRN: [id number] DOB: 1932/07/15 Today's Date: 04/15/2019    History of Present Illness 85 y.o. female with medical history significant of atrial fibrillation complicated by radial artery embolus status post embolectomy, now on anticoagulation, non-small cell lung cancer on chemotherapy and admitted after mechanical fall at home. In the ER, radiographs showed a displaced right medial acetabular fracture and inferior pubic ramus fracture.  Dr. Erlinda Hong was consulted who felt the patient was not an operative candidate, recommended NWB to right LE.   Clinical Impression   Pt was admitted for the above.  At baseline, she is independent with adls.  Sat EOB with pt this pm. She attempted to stand with PT earlier and needed total A with difficulty maintaining NWB. Will follow in acute setting with min to mod A level goals. She needs up to total a at this time for hygiene and LB dressing.     Follow Up Recommendations  SNF    Equipment Recommendations  (3;1 vs drop arm 3:1 commode)    Recommendations for Other Services       Precautions / Restrictions Precautions Precautions: Fall Restrictions Weight Bearing Restrictions: Yes RLE Weight Bearing: Non weight bearing      Mobility Bed Mobility Overal bed mobility: Needs Assistance Bed Mobility: Supine to Sit;Sit to Supine     Supine to sit: Min assist;Mod assist Sit to supine: Mod assist   General bed mobility comments: assist for legs both in and out of bed.  Pt managed trunk  Transfers Overall transfer level: Needs assistance            General transfer comment: not attempted by OT; total A with PT earlier    Balance Overall balance assessment: History of Falls                                         ADL either performed or assessed with clinical judgement   ADL Overall ADL's : Needs assistance/impaired Eating/Feeding: Independent    Grooming: Brushing hair;Set up   Upper Body Bathing: Set up   Lower Body Bathing: Moderate assistance;Bed level   Upper Body Dressing : Set up   Lower Body Dressing: Total assistance;Bed level                 General ADL Comments: sat EOB for grooming.  Pt had tried to stand earlier; may benefit from AE education     Vision         Perception     Praxis      Pertinent Vitals/Pain Pain Assessment: Faces Faces Pain Scale: Hurts even more Pain Location: R hip/groin when returning to supine Pain Descriptors / Indicators: Sore Pain Intervention(s): Limited activity within patient's tolerance;Monitored during session;Repositioned     Hand Dominance     Extremity/Trunk Assessment Upper Extremity Assessment Upper Extremity Assessment: Overall WFL for tasks assessed          Communication Communication Communication: No difficulties   Cognition Arousal/Alertness: Awake/alert Behavior During Therapy: WFL for tasks assessed/performed Overall Cognitive Status: Within Functional Limits for tasks assessed                                     General Comments       Exercises  Shoulder Instructions      Home Living Family/patient expects to be discharged to:: Skilled nursing facility Living Arrangements: Alone(daughter lives next door) Available Help at Discharge: Family;Available PRN/intermittently Type of Home: House       Home Layout: One level               Home Equipment: None          Prior Functioning/Environment Level of Independence: Independent                 OT Problem List: Decreased strength;Decreased activity tolerance;Decreased knowledge of use of DME or AE;Pain      OT Treatment/Interventions: Self-care/ADL training;DME and/or AE instruction;Energy conservation;Therapeutic activities;Patient/family education    OT Goals(Current goals can be found in the care plan section) Acute Rehab OT  Goals Patient Stated Goal: home OT Goal Formulation: With patient Time For Goal Achievement: 04/29/19 Potential to Achieve Goals: Good ADL Goals Pt Will Transfer to Toilet: with min assist;bedside commode(spt vs lateral scoot to drop arm) Pt Will Perform Toileting - Clothing Manipulation and hygiene: with mod assist;sitting/lateral leans Additional ADL Goal #1: pt will perform LB adls with mod A, sit to stand with AE Additional ADL Goal #2: pt will perform bed mob with min A in preparation for adls  OT Frequency: Min 2X/week   Barriers to D/C:            Co-evaluation              AM-PAC OT "6 Clicks" Daily Activity     Outcome Measure Help from another person eating meals?: None Help from another person taking care of personal grooming?: A Little Help from another person toileting, which includes using toliet, bedpan, or urinal?: Total Help from another person bathing (including washing, rinsing, drying)?: A Lot Help from another person to put on and taking off regular upper body clothing?: A Little Help from another person to put on and taking off regular lower body clothing?: Total 6 Click Score: 14   End of Session    Activity Tolerance: Patient tolerated treatment well Patient left: in bed;with call bell/phone within reach;with bed alarm set;with family/visitor present  OT Visit Diagnosis: Muscle weakness (generalized) (M62.81);Pain Pain - Right/Left: Left Pain - part of body: Hip                Time: 4401-0272 OT Time Calculation (min): 16 min Charges:  OT General Charges $OT Visit: 1 Visit OT Evaluation $OT Eval Low Complexity: [place], OTR/L Acute Rehabilitation Services [phone] [facility] pager [phone] office 04/15/2019  [place] 04/15/2019, 2:34 PM

## 2019-04-16 LAB — BASIC METABOLIC PANEL
Anion gap: 6 (ref 5–15)
BUN: 15 mg/dL (ref 8–23)
CO2: 27 mmol/L (ref 22–32)
Calcium: 8.3 mg/dL — ABNORMAL LOW (ref 8.9–10.3)
Chloride: 104 mmol/L (ref 98–111)
Creatinine, Ser: 0.84 mg/dL (ref 0.44–1.00)
GFR calc Af Amer: >60 mL/min (ref >60)
GFR calc non Af Amer: >60 mL/min (ref >60)
Glucose, Bld: 104 mg/dL — ABNORMAL HIGH (ref 70–99)
Potassium: 3.8 mmol/L (ref 3.5–5.1)
Sodium: 137 mmol/L (ref 135–145)

## 2019-04-16 LAB — OSMOLALITY, URINE: Osmolality, Ur: 295 mOsm/kg — ABNORMAL LOW (ref 300–900)

## 2019-04-16 LAB — SODIUM, URINE, RANDOM: Sodium, Ur: 13 mmol/L

## 2019-04-16 MED ORDER — AMIODARONE HCL 200 MG PO TABS: 200.0000 mg | ORAL_TABLET | Freq: Every day | ORAL | Status: DC

## 2019-04-16 MED ORDER — HYDROCOD POLST-CPM POLST ER 10-8 MG/5ML PO SUER: 5.0000 mL | Freq: Two times a day (BID) | ORAL | 0 refills | Status: DC | PRN

## 2019-04-16 MED ORDER — ACETAMINOPHEN 325 MG PO TABS: 650.0000 mg | ORAL_TABLET | Freq: Two times a day (BID) | ORAL | Status: DC | PRN

## 2019-04-16 MED ORDER — PRO-STAT SUGAR FREE PO LIQD: 30.0000 mL | Freq: Every day | ORAL | 0 refills | Status: DC

## 2019-04-16 MED ORDER — ACETAMINOPHEN 325 MG PO TABS: 650.0000 mg | ORAL_TABLET | Freq: Two times a day (BID) | ORAL | Status: DC

## 2019-04-16 NOTE — TOC Progression Note (Signed)
Transition of Care [facility]) - Progression Note    Patient Details  Name: ANQUINETTE PIERRO MRN: [id number] Date of Birth: 21-Mar-1933  Transition of Care [facility]) CM/SW [place], [place] Phone Number: 04/16/2019, 8:32 AM  Clinical Narrative:    Patient and daughter agreeable to [place]. CSW initiated [facility] authorization through [facility] on 11/12.  CSW will notify the patient once authorization is received.    Expected Discharge Plan: Skilled Nursing Facility Barriers to Discharge: Ship broker  Expected Discharge Plan and Services Expected Discharge Plan: [place] In-house Referral: Clinical Social Work Discharge Planning Services: Follow-up appt scheduled Post Acute Care Choice: [place] arrangements for the past 2 months: Single Family Home                                       Social Determinants of Health (SDOH) Interventions    Readmission Risk Interventions Readmission Risk Prevention Plan 03/16/2019  Transportation Screening Complete  PCP or Specialist Appt within 5-7 Days Complete  Home Care Screening Complete  Medication Review (RN CM) Complete  Some recent data might be hidden

## 2019-04-16 NOTE — Progress Notes (Signed)
PROGRESS NOTE    [name]  XHB:[id number] DOB: [birth year] DOA: 04/14/2019 PCP: No primary care provider on file.      Brief Narrative:  [name] is an 85 y.o. F with recurrent NSCLC on nivolumab, recent brachial artery embolization due to Afib on Eliquis, who presented with mechanical fall, hip pain and inability to walk.  The patient had gotten out of bed at 4AM and had mechanical fall.  In the ER, radiographs showed a displaced right medial acetabular fracture and inferior pubic ramus fracture.  Dr. Erlinda Hong was consulted who felt the patient was not an operative candidate, recommended NWB to right LE.       Assessment & Plan:  Acute right medial acetabular fracture Pubic raums fracture Pain controlled with acetaminophen -NWB to RLE -PT/OT -Continue scheduled acetaminophen   Hyponatremia Na down to 129 after admission. Asymptomatic. Appeared euvolemic. BUN to creat resolved.  Na resolved.  Paroxysmal atrial fibrillation HR controlled -Continue Eliquis -Continue amiodarone and metoprolol  NSCLC  At risk of undernutrition BMI 19 -Nutrition consult       MDM and disposition: The below labs and imaging reports reviewed and summarized above.  Medication management as above.   The patient was admitted with acetabular fracture and pubic ramus fracture.  The patient lives independently at home, is independent for all self-cares.  Due to the nature of her fracture, she should be nonweightbearing for at least the next 2 weeks, and will be dependent for all transfers, toileting, dressing, and feeding.  In light of this, skilled nursing rehab is our only current safe discharge option.         DVT prophylaxis: N/A on Eliquis Code Status: Full code Family Communication:      Consultants:   Orthopedics, Dr. Erlinda Hong  Procedures:     Antimicrobials:       Subjective: No seizure, vomiting, LOC, headache, dizziness, weakness.  Pelvic pain only with  movement, controlled with acetaminophen.  No fever.  Has chronic cough after radiation.        Objective: Vitals:   04/15/19 0904 04/15/19 1447 04/15/19 2154 04/16/19 0408  BP: (!) 120/58 133/68 (!) 133/96 (!) 152/87  Pulse: (!) 52 (!) 48 95 (!) 52  Resp:  16 16 16   Temp:  97.7 F (36.5 C) (!) 97.5 F (36.4 C) 98 F (36.7 C)  TempSrc:  Oral Oral   SpO2:  96% 98% 100%  Weight:      Height:        Intake/Output Summary (Last 24 hours) at 04/16/2019 0723 Last data filed at 04/16/2019 0500 Gross per 24 hour  Intake 480 ml  Output 1200 ml  Net -720 ml   Filed Weights   04/14/19 1537 04/15/19 0700  Weight: 49.5 kg 48 kg    Examination: General appearance: very thin adult female, alert and in no acute distress.  Lying in bed HEENT: Anicteric, conjunctiva pink, lids and lashes normal. No nasal deformity, discharge, epistaxis.  Lips moist, teeth normal. OP moist, no oral lesions.   Skin: Warm and dry.  No suspicious rashes or lesions. Cardiac: Slow mostly regular, no murmurs appreciated.  JVP normal. No LE edema.    Respiratory: Normal respiratory rate and rhythm.  CTAB without rales or wheezes. Abdomen: Abdomen soft.  No TTP or guarding. No ascites, distension, hepatosplenomegaly.   MSK: No deformities or effusions of the large joints of the upper or lower extremities bilaterally. Diffuse loss of subcutaneous fat. Neuro: Awake and alert.  Naming is grossly intact, and the patient's recall, recent and remote, as well as general fund of knowledge seem within normal limits.  Muscle tone normal for age, without fasciculations.  Moves all extremities equally and normal strength for age, but leg testing not done due to fracture . Speech fluent.    Psych: Sensorium intact and responding to questions, attention normal. Affect normal.  Judgment and insight appear normal.        Data Reviewed: I have personally reviewed following labs and imaging studies:  CBC: Recent Labs  Lab  04/20/19 0912 04/15/19 0211  WBC 8.0 6.1  NEUTROABS 6.9  --   HGB 14.8 12.2  HCT 45.7 37.1  MCV 98.5 98.4  PLT 224 412   Basic Metabolic Panel: Recent Labs  Lab 04/20/2019 0912 04/15/19 0211 04/16/19 0303  NA 135 129* 137  K 4.3 3.3* 3.8  CL 102 98 104  CO2 25 23 27   GLUCOSE 157* 102* 104*  BUN 21 20 15   CREATININE 0.94 0.89 0.84  CALCIUM 9.1 8.0* 8.3*   GFR: Estimated Creatinine Clearance: 37.8 mL/min (by C-G formula based on SCr of 0.84 mg/dL). Liver Function Tests: No results for input(s): AST, ALT, ALKPHOS, BILITOT, PROT, ALBUMIN in the last 168 hours. No results for input(s): LIPASE, AMYLASE in the last 168 hours. No results for input(s): AMMONIA in the last 168 hours. Coagulation Profile: Recent Labs  Lab 04-20-19 0912  INR 1.2   Cardiac Enzymes: No results for input(s): CKTOTAL, CKMB, CKMBINDEX, TROPONINI in the last 168 hours. BNP (last 3 results) No results for input(s): PROBNP in the last 8760 hours. HbA1C: No results for input(s): HGBA1C in the last 72 hours. CBG: No results for input(s): GLUCAP in the last 168 hours. Lipid Profile: No results for input(s): CHOL, HDL, LDLCALC, TRIG, CHOLHDL, LDLDIRECT in the last 72 hours. Thyroid Function Tests: No results for input(s): TSH, T4TOTAL, FREET4, T3FREE, THYROIDAB in the last 72 hours. Anemia Panel: No results for input(s): VITAMINB12, FOLATE, FERRITIN, TIBC, IRON, RETICCTPCT in the last 72 hours. Urine analysis:    Component Value Date/Time   LABSPEC 1.030 03/17/2012 1051   PHURINE 5.0 03/17/2012 1051   HGBUR Negative 03/17/2012 1051   BILIRUBINUR Negative 03/17/2012 1051   KETONESUR Negative 03/17/2012 1051   PROTEINUR Negative 03/17/2012 1051   NITRITE Negative 03/17/2012 1051   LEUKOCYTESUR Negative 03/17/2012 1051   Sepsis Labs: @LABRCNTIP (procalcitonin:4,lacticacidven:4)  ) Recent Results (from the past 240 hour(s))  SARS CORONAVIRUS 2 (TAT 6-24 HRS) Nasopharyngeal Nasopharyngeal Swab      Status: None   Collection Time: 2019-04-20 10:44 AM   Specimen: Nasopharyngeal Swab  Result Value Ref Range Status   SARS Coronavirus 2 NEGATIVE NEGATIVE Final    Comment: (NOTE) SARS-CoV-2 target nucleic acids are NOT DETECTED. The SARS-CoV-2 RNA is generally detectable in upper and lower respiratory specimens during the acute phase of infection. Negative results do not preclude SARS-CoV-2 infection, do not rule out co-infections with other pathogens, and should not be used as the sole basis for treatment or other patient management decisions. Negative results must be combined with clinical observations, patient history, and epidemiological information. The expected result is Negative. Fact Sheet for Patients: SugarRoll.be Fact Sheet for Healthcare Providers: https://www.woods-mathews.com/ This test is not yet approved or cleared by the [place] FDA and  has been authorized for detection and/or diagnosis of SARS-CoV-2 by FDA under an Emergency Use Authorization (EUA). This EUA will remain  in effect (meaning this test can be used)  for the duration of the COVID-19 declaration under Section 56 4(b)(1) of the Act, 21 U.S.C. section 360bbb-3(b)(1), unless the authorization is terminated or revoked sooner. Performed at [facility], [place] [address], [place], [place] [address]          Radiology Studies: Dg Chest 1 View  Result Date: 04/14/2019 CLINICAL DATA:  [place] backwards this morning. EXAM: CHEST  1 VIEW COMPARISON:  03/22/2019 FINDINGS: Right IJ Port-A-Cath unchanged. Lungs are adequately inflated with interval improvement in right effusion with only small residual amount of right pleural fluid. Known right paramediastinal mass unchanged. Lungs are otherwise clear. Cardiomediastinal silhouette and remainder of the exam is unchanged. IMPRESSION: 1. No acute cardiopulmonary disease. Interval improved right pleural effusion with  small residual amount of pleural fluid. 2.  Stable right paramediastinal masslike density. Electronically Signed   By: Marin Olp M.D.   On: 04/14/2019 09:40   Dg Pelvis 1-2 Views  Result Date: 04/14/2019 CLINICAL DATA:  Pelvic fracture. EXAM: PELVIS - 1-2 VIEW COMPARISON:  Same day. FINDINGS: Mildly displaced fracture is noted at the junction of the right superior pubic ramus and acetabulum. Probable minimally displaced fracture is seen involving the right inferior pubic ramus. Hip joints are unremarkable. IMPRESSION: Mildly displaced fracture is noted at the junction of the right superior pubic ramus and acetabulum. Probable minimally displaced fracture is seen involving right inferior pubic ramus. Electronically Signed   By: Marijo Conception M.D.   On: 04/14/2019 13:08   Dg Hip Unilat With Pelvis 2-3 Views Right  Result Date: 04/14/2019 CLINICAL DATA:  [place] backwards today while going to the bathroom. EXAM: DG HIP (WITH OR WITHOUT PELVIS) 2-3V RIGHT COMPARISON:  None. FINDINGS: Diffuse osteopenia. Minimal degenerative changes of the hips. There is a displaced fracture over the medial portion of the right acetabulum as well as minimally displaced fracture of the right inferior pubic ramus. IMPRESSION: Displaced fractures of the medial right acetabulum and right inferior pubic ramus. Electronically Signed   By: Marin Olp M.D.   On: 04/14/2019 09:37        Scheduled Meds: . acetaminophen  650 mg Oral BID  . amiodarone  200 mg Oral Daily  . apixaban  2.5 mg Oral BID  . feeding supplement (PRO-STAT SUGAR FREE 64)  30 mL Oral Daily  . metoprolol succinate  50 mg Oral Daily  . multivitamin with minerals  1 tablet Oral Daily  . sodium chloride flush  3 mL Intravenous Q12H  . vitamin C  500 mg Oral Daily   Continuous Infusions:   LOS: 2 days    Time spent: 25 minutes    Edwin Dada, MD [facility] 04/16/2019, 7:23 AM     Please page through [place]:   [email or web address] Password [email or web address] If 7PM-7AM, please contact night-coverage

## 2019-04-16 NOTE — TOC Transition Note (Signed)
Transition of Care [facility]) - CM/SW Discharge Note   Patient Details  Name: MARLINE MORACE MRN: [id number] Date of Birth: 1932/06/16  [facility]) CM/SW Contact:  Lia Hopping, [place] Phone Number: 04/16/2019, 2:59 PM   Clinical Narrative:    Ship broker received. [facility] [phone], Plan Auth Billing: [id number] Starting 11/13, next review 11/16.  CSW notified the patient and her daughters.  PTAR arranged to transport. Room 405  Nurse call report to: [phone]   Final next level of care: Skilled Nursing Facility Barriers to Discharge: Insurance Authorization   Patient Goals and CMS Choice   CMS Medicare.gov Compare Post Acute Care list provided to:: Patient Choice offered to / list presented to : Patient, Adult Children  Discharge Placement PASRR number recieved: 04/15/19            Patient chooses bed at: [place] [place] Patient to be transferred to facility by: [place] Name of family member notified: Daughter [name] and Sharyn Lull Patient and family notified of of transfer: 04/16/19  Discharge Plan and Services In-house Referral: Clinical Social Work Discharge Planning Services: Follow-up appt scheduled Post Acute Care Choice: [place]                               Social Determinants of Health (SDOH) Interventions     Readmission Risk Interventions Readmission Risk Prevention Plan 03/16/2019  Transportation Screening Complete  PCP or Specialist Appt within 5-7 Days Complete  Home Care Screening Complete  Medication Review (RN CM) Complete  Some recent data might be hidden

## 2019-04-16 NOTE — Discharge Summary (Addendum)
Physician Discharge Summary  [name]:[id number] DOB: 1933-03-13 DOA: 04/14/2019  PCP: No primary care provider on file.  Admit date: 04/14/2019 Discharge date: 04/16/2019  Admitted From: Home Disposition:  SNF   Recommendations for Outpatient Follow-up:  1. Follow up with Dr. Erlinda Hong in 2 weeks 2. NWB to RLE 3. Use scheduled acetaminophen 650 mg twice daily for 1 week, then stop scheduled dose and use PRN      Home Health: N/A  Equipment/Devices: TBD at SNF  Discharge Condition: Good  CODE STATUS: FULL Diet recommendation: Cardiac  Brief/Interim Summary: [name] is an 85 y.o. F with recurrent NSCLC on nivolumab, recent brachial artery embolization due to Afib on Eliquis, who presented with mechanical fall, hip pain and inability to walk.  The patient had gotten out of bed at 4AM and had mechanical fall.  In the ER, radiographs showed a displaced right medial acetabular fracture and inferior pubic ramus fracture.  Dr. Erlinda Hong was consulted who felt the patient was not an operative candidate, recommended NWB to right LE.     PRINCIPAL HOSPITAL DIAGNOSIS: Acetabular and inferior pubic ramus fracture    Discharge Diagnoses:   Acute right medial acetabular fracture Pubic raums fracture Patient evaluated by orthopedics who felt this was a nonoperative fracture. They recommended mobilization as able with PT, but no weightbearing to the right lower extremity.    Pain was controlled with scheduled acetaminophen, and she should have follow-up with orthopedics in 2 weeks.    Hyponatremia Na down to 129 after admission. Asymptomatic. Appeared euvolemic. This resolved spontaneously.  Paroxysmal atrial fibrillation HR controlled  NSCLC on nivolumab  At risk of undernutrition BMI 19.  Nutrition were consulted, they recommended discharge with prostat nutrition supplement.          Discharge Instructions  Discharge Instructions    Diet - low sodium  heart healthy   Complete by: As directed    Discharge instructions   Complete by: As directed    Call Dr. Erlinda Hong (number listed below) for a follow up appointment in 2 weeks Non-weight bearing on right lower extremity Take scheduled acetaminophen 650 mg twice daily for 2 weeks then stop May have additional acetaminophen 650 mg twice daily PRN for pain Take Tussionex for chronic cough   Increase activity slowly   Complete by: As directed      Allergies as of 04/16/2019      Reactions   Simvastatin Other (See Comments)   Cluster migraines      Medication List    TAKE these medications   acetaminophen 325 MG tablet Commonly known as: TYLENOL Take 2 tablets (650 mg total) by mouth every 12 (twelve) hours as needed for mild pain (or Fever >/= 101).   acetaminophen 325 MG tablet Commonly known as: TYLENOL Take 2 tablets (650 mg total) by mouth 2 (two) times daily.   albuterol 108 (90 Base) MCG/ACT inhaler Commonly known as: VENTOLIN HFA Inhale 1-2 puffs into the lungs every 6 (six) hours as needed for wheezing or shortness of breath.   amiodarone 200 MG tablet Commonly known as: PACERONE Take 1 tablet (200 mg total) by mouth daily. Start taking on: April 17, 2019 What changed:   when to take this  additional instructions   apixaban 2.5 MG Tabs tablet Commonly known as: ELIQUIS Take 1 tablet (2.5 mg total) by mouth 2 (two) times daily.   chlorpheniramine-HYDROcodone 10-8 MG/5ML Suer Commonly known as: TUSSIONEX Take 5 mLs by mouth 2 (two) times  daily as needed for cough.   feeding supplement (PRO-STAT SUGAR FREE 64) Liqd Take 30 mLs by mouth daily. Start taking on: April 17, 2019   metoprolol succinate 50 MG 24 hr tablet Commonly known as: TOPROL-XL Take 1 tablet (50 mg total) by mouth daily. Take with or immediately following a meal.  Refills per cardiology or primary care   multivitamin with minerals Tabs tablet Take 1 tablet by mouth daily.   vitamin C 500  MG tablet Commonly known as: ASCORBIC ACID Take 500 mg by mouth daily.       Contact information for follow-up providers    Leandrew Koyanagi, MD. Schedule an appointment as soon as possible for a visit in 2 week(s).   Specialty: Orthopedic Surgery Contact information: [place] [address] [phone]            Contact information for after-discharge care    Destination    [facility]erred [facility] .   Service: Skilled Nursing Contact information: [place] [phone]                 Allergies  Allergen Reactions  . Simvastatin Other (See Comments)    Cluster migraines    Consultations:  Orthopedic   Procedures/Studies: Dg Chest 1 View  Result Date: 04/14/2019 CLINICAL DATA:  [place] backwards this morning. EXAM: CHEST  1 VIEW COMPARISON:  03/22/2019 FINDINGS: Right IJ Port-A-Cath unchanged. Lungs are adequately inflated with interval improvement in right effusion with only small residual amount of right pleural fluid. Known right paramediastinal mass unchanged. Lungs are otherwise clear. Cardiomediastinal silhouette and remainder of the exam is unchanged. IMPRESSION: 1. No acute cardiopulmonary disease. Interval improved right pleural effusion with small residual amount of pleural fluid. 2.  Stable right paramediastinal masslike density. Electronically Signed   By: Marin Olp M.D.   On: 04/14/2019 09:40   Dg Chest 2 View  Result Date: 03/19/2019 CLINICAL DATA:  Shortness of breath, cough EXAM: CHEST - 2 VIEW COMPARISON:  CT 01/04/2019 FINDINGS: Right Port-A-Cath in place with the tip at the cavoatrial junction. Large right upper paramediastinal mass again noted as seen on recent chest CT. Moderate right pleural effusion and small left pleural effusion, new since prior CT. Bibasilar atelectasis. Bilateral nodular opacities. No acute bony abnormality. IMPRESSION: Large right  paramediastinal mass and bilateral nodular opacities again noted as seen on recent chest CT. Moderate right pleural effusion and small left pleural effusion with bibasilar atelectasis. Electronically Signed   By: Rolm Baptise M.D.   On: 03/19/2019 11:13   Dg Pelvis 1-2 Views  Result Date: 04/14/2019 CLINICAL DATA:  Pelvic fracture. EXAM: PELVIS - 1-2 VIEW COMPARISON:  Same day. FINDINGS: Mildly displaced fracture is noted at the junction of the right superior pubic ramus and acetabulum. Probable minimally displaced fracture is seen involving the right inferior pubic ramus. Hip joints are unremarkable. IMPRESSION: Mildly displaced fracture is noted at the junction of the right superior pubic ramus and acetabulum. Probable minimally displaced fracture is seen involving right inferior pubic ramus. Electronically Signed   By: Marijo Conception M.D.   On: 04/14/2019 13:08   Dg Chest Port 1 View  Result Date: 03/22/2019 CLINICAL DATA:  Status post right thoracentesis. EXAM: PORTABLE CHEST 1 VIEW COMPARISON:  Chest x-ray dated March 19, 2019. FINDINGS: Unchanged right chest wall port catheter. A decreased now small right pleural effusion status post thoracentesis. Unchanged small left pleural effusion. No pneumothorax.  Grossly unchanged large right upper paramediastinal mass. Previously noted small nodular opacities in both lungs are not as well visualized on today's study. Normal heart size. No acute osseous abnormality. IMPRESSION: 1. Decreased now small right pleural effusion status post thoracentesis. No pneumothorax. 2. Grossly unchanged large right upper paramediastinal mass. Electronically Signed   By: Titus Dubin M.D.   On: 03/22/2019 12:03   Dg Hip Unilat With Pelvis 2-3 Views Right  Result Date: 04/14/2019 CLINICAL DATA:  [place] backwards today while going to the bathroom. EXAM: DG HIP (WITH OR WITHOUT PELVIS) 2-3V RIGHT COMPARISON:  None. FINDINGS: Diffuse osteopenia. Minimal degenerative  changes of the hips. There is a displaced fracture over the medial portion of the right acetabulum as well as minimally displaced fracture of the right inferior pubic ramus. IMPRESSION: Displaced fractures of the medial right acetabulum and right inferior pubic ramus. Electronically Signed   By: Marin Olp M.D.   On: 04/14/2019 09:37   US Thoracentesis Asp Pleural Space W/img Guide  Result Date: 03/22/2019 INDICATION: Patient with history of lung cancer, right pleural effusion. Request is made for diagnostic and therapeutic thoracentesis. EXAM: ULTRASOUND GUIDED DIAGNOSTIC AND THERAPEUTIC RIGHT THORACENTESIS MEDICATIONS: 10 mL 1% lidocaine COMPLICATIONS: None immediate. PROCEDURE: An ultrasound guided thoracentesis was thoroughly discussed with the patient and questions answered. The benefits, risks, alternatives and complications were also discussed. The patient understands and wishes to proceed with the procedure. Written consent was obtained. Ultrasound was performed to localize and mark an adequate pocket of fluid in the right chest. The area was then prepped and draped in the normal sterile fashion. 1% Lidocaine was used for local anesthesia. Under ultrasound guidance a 6 Fr Safe-T-Centesis catheter was introduced. Thoracentesis was performed. The catheter was removed and a dressing applied. FINDINGS: A total of approximately 300 mL of yellow fluid was removed. Samples were sent to the laboratory as requested by the clinical team. IMPRESSION: Successful ultrasound guided diagnostic and therapeutic right thoracentesis yielding 300 mL of pleural fluid. Read by: Brynda Greathouse [name] Electronically Signed   By: Jacqulynn Cadet M.D.   On: 03/22/2019 13:33      Subjective: Feeling well.  No dizziness, syncope, loss of consciousness, weakness, seizure, vomiting.  Mild pelvic pain with movement, controlled with acetaminophen.  No fever.  Chronic cough present.  Discharge Exam: Vitals:   04/16/19 0855  04/16/19 1410  BP: 122/71 (!) 125/59  Pulse: (!) 55 (!) 59  Resp: 16 (!) 21  Temp: 97.7 F (36.5 C) 98.4 F (36.9 C)  SpO2: 100% 98%   Vitals:   04/15/19 2154 04/16/19 0408 04/16/19 0855 04/16/19 1410  BP: (!) 133/96 (!) 152/87 122/71 (!) 125/59  Pulse: 95 (!) 52 (!) 55 (!) 59  Resp: 16 16 16  (!) 21  Temp: (!) 97.5 F (36.4 C) 98 F (36.7 C) 97.7 F (36.5 C) 98.4 F (36.9 C)  TempSrc: Oral  Oral Oral  SpO2: 98% 100% 100% 98%  Weight:      Height:        General: Pt is alert, awake, not in acute distress, lying in bed, lights off. Cardiovascular: RRR, nl S1-S2, no murmurs appreciated.   No LE edema.   Respiratory: Normal respiratory rate and rhythm.  CTAB without rales or wheezes. Abdominal: Abdomen soft and non-tender.  No distension or HSM.   Neuro/Psych: Strength symmetric in upper extremities, lower extremity strength not tested due to pain.  Judgment and insight appear normal, interactive, pleasant.   The results of  significant diagnostics from this hospitalization (including imaging, microbiology, ancillary and laboratory) are listed below for reference.     Microbiology: Recent Results (from the past 240 hour(s))  SARS CORONAVIRUS 2 (TAT 6-24 HRS) Nasopharyngeal Nasopharyngeal Swab     Status: None   Collection Time: 04/14/19 10:44 AM   Specimen: Nasopharyngeal Swab  Result Value Ref Range Status   SARS Coronavirus 2 NEGATIVE NEGATIVE Final    Comment: (NOTE) SARS-CoV-2 target nucleic acids are NOT DETECTED. The SARS-CoV-2 RNA is generally detectable in upper and lower respiratory specimens during the acute phase of infection. Negative results do not preclude SARS-CoV-2 infection, do not rule out co-infections with other pathogens, and should not be used as the sole basis for treatment or other patient management decisions. Negative results must be combined with clinical observations, patient history, and epidemiological information. The expected result is  Negative. Fact Sheet for Patients: SugarRoll.be Fact Sheet for Healthcare Providers: https://www.woods-mathews.com/ This test is not yet approved or cleared by the [place] FDA and  has been authorized for detection and/or diagnosis of SARS-CoV-2 by FDA under an Emergency Use Authorization (EUA). This EUA will remain  in effect (meaning this test can be used) for the duration of the COVID-19 declaration under Section 56 4(b)(1) of the Act, 21 U.S.C. section 360bbb-3(b)(1), unless the authorization is terminated or revoked sooner. Performed at [facility], [place] [address], [place], [place] [address]      Labs: BNP (last 3 results) No results for input(s): BNP in the last 8760 hours. Basic Metabolic Panel: Recent Labs  Lab 04/14/19 0912 04/15/19 0211 04/16/19 0303  NA 135 129* 137  K 4.3 3.3* 3.8  CL 102 98 104  CO2 25 23 27   GLUCOSE 157* 102* 104*  BUN 21 20 15   CREATININE 0.94 0.89 0.84  CALCIUM 9.1 8.0* 8.3*   Liver Function Tests: No results for input(s): AST, ALT, ALKPHOS, BILITOT, PROT, ALBUMIN in the last 168 hours. No results for input(s): LIPASE, AMYLASE in the last 168 hours. No results for input(s): AMMONIA in the last 168 hours. CBC: Recent Labs  Lab 04/14/19 0912 04/15/19 0211  WBC 8.0 6.1  NEUTROABS 6.9  --   HGB 14.8 12.2  HCT 45.7 37.1  MCV 98.5 98.4  PLT 224 196   Cardiac Enzymes: No results for input(s): CKTOTAL, CKMB, CKMBINDEX, TROPONINI in the last 168 hours. BNP: Invalid input(s): POCBNP CBG: No results for input(s): GLUCAP in the last 168 hours. D-Dimer No results for input(s): DDIMER in the last 72 hours. Hgb A1c No results for input(s): HGBA1C in the last 72 hours. Lipid Profile No results for input(s): CHOL, HDL, LDLCALC, TRIG, CHOLHDL, LDLDIRECT in the last 72 hours. Thyroid function studies No results for input(s): TSH, T4TOTAL, T3FREE, THYROIDAB in the last 72  hours.  Invalid input(s): FREET3 Anemia work up No results for input(s): VITAMINB12, FOLATE, FERRITIN, TIBC, IRON, RETICCTPCT in the last 72 hours. Urinalysis    Component Value Date/Time   LABSPEC 1.030 03/17/2012 1051   PHURINE 5.0 03/17/2012 1051   HGBUR Negative 03/17/2012 1051   BILIRUBINUR Negative 03/17/2012 1051   KETONESUR Negative 03/17/2012 1051   PROTEINUR Negative 03/17/2012 1051   NITRITE Negative 03/17/2012 1051   LEUKOCYTESUR Negative 03/17/2012 1051   Sepsis Labs Invalid input(s): PROCALCITONIN,  WBC,  LACTICIDVEN Microbiology Recent Results (from the past 240 hour(s))  SARS CORONAVIRUS 2 (TAT 6-24 HRS) Nasopharyngeal Nasopharyngeal Swab     Status: None   Collection Time: 04/14/19  10:44 AM   Specimen: Nasopharyngeal Swab  Result Value Ref Range Status   SARS Coronavirus 2 NEGATIVE NEGATIVE Final    Comment: (NOTE) SARS-CoV-2 target nucleic acids are NOT DETECTED. The SARS-CoV-2 RNA is generally detectable in upper and lower respiratory specimens during the acute phase of infection. Negative results do not preclude SARS-CoV-2 infection, do not rule out co-infections with other pathogens, and should not be used as the sole basis for treatment or other patient management decisions. Negative results must be combined with clinical observations, patient history, and epidemiological information. The expected result is Negative. Fact Sheet for Patients: SugarRoll.be Fact Sheet for Healthcare Providers: https://www.woods-mathews.com/ This test is not yet approved or cleared by the [place] FDA and  has been authorized for detection and/or diagnosis of SARS-CoV-2 by FDA under an Emergency Use Authorization (EUA). This EUA will remain  in effect (meaning this test can be used) for the duration of the COVID-19 declaration under Section 56 4(b)(1) of the Act, 21 U.S.C. section 360bbb-3(b)(1), unless the authorization is  terminated or revoked sooner. Performed at [facility], [place] [address] [place], [place] [address]      Time coordinating discharge: 25 minutes The  controlled substances registry was reviewed for this patient prior to filling the <5 days supply controlled substances script.      SIGNED:   Edwin Dada, MD  [facility] 04/16/2019, 3:12 PM

## 2019-04-21 ENCOUNTER — Telehealth: Payer: Self-pay | Admitting: *Deleted

## 2019-04-21 NOTE — Telephone Encounter (Signed)
We can delay by 1-2 weeks

## 2019-04-21 NOTE — Telephone Encounter (Signed)
Call from pt daughter, pt unable to come to her upcoming appt as she fell broke hip and is currently in a rehab facility. Pt would like to skip this treatment and follow up in Dec as scheduled.. Message forward to MD for review. Message to scheduling to cancel upcoming appt.

## 2019-04-26 ENCOUNTER — Ambulatory Visit (HOSPITAL_COMMUNITY): Payer: Medicare Other

## 2019-04-28 ENCOUNTER — Ambulatory Visit: Payer: Medicare Other | Admitting: Internal Medicine

## 2019-04-28 ENCOUNTER — Other Ambulatory Visit: Payer: Medicare Other

## 2019-04-28 ENCOUNTER — Ambulatory Visit: Payer: Medicare Other

## 2019-05-04 ENCOUNTER — Encounter: Payer: Self-pay | Admitting: Orthopaedic Surgery

## 2019-05-04 ENCOUNTER — Other Ambulatory Visit: Payer: Self-pay

## 2019-05-04 ENCOUNTER — Ambulatory Visit (INDEPENDENT_AMBULATORY_CARE_PROVIDER_SITE_OTHER): Payer: Medicare Other | Admitting: Orthopaedic Surgery

## 2019-05-04 ENCOUNTER — Ambulatory Visit: Payer: Self-pay

## 2019-05-04 VITALS — Ht 62.0 in | Wt 107.0 lb

## 2019-05-04 DIAGNOSIS — S32471A Displaced fracture of medial wall of right acetabulum, initial encounter for closed fracture: Secondary | ICD-10-CM | POA: Diagnosis not present

## 2019-05-04 NOTE — Progress Notes (Signed)
Office Visit Note   Patient: [name]           Date of Birth: January 05, 1933           MRN: [id number] Visit Date: 05/04/2019              Requested by: No referring provider defined for this encounter. PCP: [name] Caraway, MD   Assessment & Plan: Visit Diagnoses:  1. Closed displaced fracture of medial wall of right acetabulum, initial encounter ([place])     Plan: Impression is stable right acetabular fracture.  We will continue to keep her nonweightbearing for 3 additional weeks.  Follow-up at that time with AP pelvis and Judet x-rays at that time.  She may do long nonweightbearing strengthening and range of motion exercises to the right lower extremity as tolerated with PT.  Follow-Up Instructions: Return in about 3 weeks (around 05/25/2019).   Orders:  Orders Placed This Encounter  Procedures  . XR Pelvis 1-2 Views   No orders of the defined types were placed in this encounter.     Procedures: No procedures performed   Clinical Data: No additional findings.   Subjective: Chief Complaint  Patient presents with  . Hip Injury    DOI 04/14/2019    [name] is here today for hospital follow-up of her right acetabular fracture.  She is a skilled nursing facility.  She has been ambulating in a wheelchair.  She does not have any significant pain in her right hip.   Review of Systems   Objective: Vital Signs: Ht 5\' 2"  (1.575 m)   Wt 107 lb (48.5 kg)   BMI 19.57 kg/m   Physical Exam  Ortho Exam Right hip exam shows decent range of motion without pain.  She is able to flex her hip and extend her knee without pain. Specialty Comments:  No specialty comments available.  Imaging: Xr Pelvis 1-2 Views  Result Date: 05/04/2019 Stable right acetabular fracture without any interval displacement or worsening.    PMFS History: Patient Active Problem List   Diagnosis Date Noted  . Acetabular fracture ([place]) 04/14/2019  . Brachial artery embolus ([place])  03/12/2019  . Port-A-Cath in place 12/10/2017  . Goals of care, counseling/discussion 09/10/2017  . Encounter for antineoplastic immunotherapy 09/10/2017  . Bronchitis 08/16/2016  . Radiation pneumonitis ([place]) 01/24/2016  . Encounter for antineoplastic chemotherapy 12/14/2014  . Bilateral lung cancer ([place]) 04/08/2011   Past Medical History:  Diagnosis Date  . Atrial fibrillation ([place])   . FHx: chemotherapy 2008&2011   4 cycles cisplatin,taxotere,2008& carboplatin and Alimta 2011  . History of migraine headaches   . Hypercholesterolemia   . lung ca dx'd 06/2006   rt and lt lung  . Lung cancer ([place]) 3/14/1   right- Adenocarcinoma w/bronchioalveolar features  . Radiation 11/01/15-11/14/15   right upper lobe 30 gray  . Radiation pneumonitis ([place]) 01/24/2016    Family History  Problem Relation Age of Onset  . Hypertension Father     Past Surgical History:  Procedure Laterality Date  . CATARACT EXTRACTION  2013   bilateral  . EMBOLECTOMY Right 03/13/2019   Procedure: RIGHT BRACHIAL, RADIAL, ULNAR EMBOLECTOMY;  Surgeon: Elam Dutch, MD;  Location: [facility] OR;  Service: Vascular;  Laterality: Right;  . left  lowerlung lobectomy Left 06/19/2006   Dr.Burney,Left lower lobectomy  . MYRINGOTOMY Bilateral 06/25/2016  . Porta-cath  2011   Social History   Occupational History  . Not on file  Tobacco Use  .  Smoking status: Never Smoker  . Smokeless tobacco: Never Used  Substance and Sexual Activity  . Alcohol use: No  . Drug use: No  . Sexual activity: Not Currently

## 2019-05-21 ENCOUNTER — Telehealth: Payer: Self-pay | Admitting: Radiology

## 2019-05-21 NOTE — Telephone Encounter (Signed)
Gave verbal  Start date 05-18-2019 2week 1 1week 2 2week 3  Strengthening, balance, coordination, gait training, transfer training. Patient currently non-weightbearing. Patient is ready to get moving per Concepcion Elk will wait til after 12/22 appointment for status change.

## 2019-05-24 ENCOUNTER — Other Ambulatory Visit: Payer: Self-pay

## 2019-05-24 ENCOUNTER — Encounter (HOSPITAL_COMMUNITY): Payer: Self-pay

## 2019-05-24 ENCOUNTER — Ambulatory Visit (HOSPITAL_COMMUNITY)
Admission: RE | Admit: 2019-05-24 | Discharge: 2019-05-24 | Disposition: A | Discharge status: Home / Self Care | Payer: Medicare Other | Source: Ambulatory Visit | Attending: Internal Medicine | Admitting: Internal Medicine

## 2019-05-24 ENCOUNTER — Encounter: Payer: Self-pay | Admitting: *Deleted

## 2019-05-24 DIAGNOSIS — C3490 Malignant neoplasm of unspecified part of unspecified bronchus or lung: Secondary | ICD-10-CM | POA: Insufficient documentation

## 2019-05-24 DIAGNOSIS — C3492 Malignant neoplasm of unspecified part of left bronchus or lung: Secondary | ICD-10-CM | POA: Diagnosis present

## 2019-05-24 DIAGNOSIS — C3491 Malignant neoplasm of unspecified part of right bronchus or lung: Secondary | ICD-10-CM | POA: Insufficient documentation

## 2019-05-24 NOTE — Progress Notes (Signed)
Ct order changed per radiology

## 2019-05-25 ENCOUNTER — Ambulatory Visit (INDEPENDENT_AMBULATORY_CARE_PROVIDER_SITE_OTHER): Payer: Medicare Other | Admitting: Orthopaedic Surgery

## 2019-05-25 ENCOUNTER — Encounter: Payer: Self-pay | Admitting: Orthopaedic Surgery

## 2019-05-25 ENCOUNTER — Ambulatory Visit: Payer: Self-pay

## 2019-05-25 DIAGNOSIS — S32471A Displaced fracture of medial wall of right acetabulum, initial encounter for closed fracture: Secondary | ICD-10-CM

## 2019-05-25 NOTE — Progress Notes (Signed)
Office Visit Note   Patient: [name]           Date of Birth: 1933/04/11           MRN: [id number] Visit Date: 05/25/2019              Requested by: [name], [place],  [place] [address] PCP: [name], MD   Assessment & Plan: Visit Diagnoses:  1. Closed displaced fracture of medial wall of right acetabulum, initial encounter ([place])     Plan: Impression is 6 weeks status post right acetabular wall fracture.  This appears to be healing on radiographs.  Will allow her to transition to weightbearing as tolerated.  Continue with physical therapy for gait training and mobilization.  Follow-up with [place] in 6 weeks time for repeat evaluation AP pelvis and Judet x-rays.  Call with concerns or questions in meantime.  Follow-Up Instructions: Return in about 6 weeks (around 07/06/2019).   Orders:  Orders Placed This Encounter  Procedures  . XR HIP UNILAT W OR W/O PELVIS 2-3 VIEWS RIGHT   No orders of the defined types were placed in this encounter.     Procedures: No procedures performed   Clinical Data: No additional findings.   Subjective: Chief Complaint  Patient presents with  . Right Hip - Pain    HPI patient is a pleasant 85 year old female who comes in today 6 weeks out right acetabular fracture, date of injury 04/14/2019.  She has been doing well.  She has been compliant nonweightbearing ambulating in a wheelchair.  No pain.  She has been working with home health physical therapy.     Objective: Vital Signs: There were no vitals taken for this visit.    Ortho Exam examination of her right lower extremity reveals painless range of motion of the hip.  She is neurovascular intact distally.  Specialty Comments:  No specialty comments available.  Imaging: XR HIP UNILAT W OR W/O PELVIS 2-3 VIEWS RIGHT  Result Date: 05/25/2019 X-rays demonstrate continued healing right acetabular fracture without any interval displacement     PMFS History: Patient Active Problem List   Diagnosis Date Noted  . Acetabular fracture ([place]) 04/14/2019  . Brachial artery embolus ([place]) 03/12/2019  . Port-A-Cath in place 12/10/2017  . Goals of care, counseling/discussion 09/10/2017  . Encounter for antineoplastic immunotherapy 09/10/2017  . Bronchitis 08/16/2016  . Radiation pneumonitis ([place]) 01/24/2016  . Encounter for antineoplastic chemotherapy 12/14/2014  . Bilateral lung cancer ([place]) 04/08/2011   Past Medical History:  Diagnosis Date  . Atrial fibrillation ([place])   . FHx: chemotherapy 2008&2011   4 cycles cisplatin,taxotere,2008& carboplatin and Alimta 2011  . History of migraine headaches   . Hypercholesterolemia   . lung ca dx'd 06/2006   rt and lt lung  . Lung cancer ([place]) 3/14/1   right- Adenocarcinoma w/bronchioalveolar features  . Radiation 11/01/15-11/14/15   right upper lobe 30 gray  . Radiation pneumonitis ([place]) 01/24/2016    Family History  Problem Relation Age of Onset  . Hypertension Father     Past Surgical History:  Procedure Laterality Date  . CATARACT EXTRACTION  2013   bilateral  . EMBOLECTOMY Right 03/13/2019   Procedure: RIGHT BRACHIAL, RADIAL, ULNAR EMBOLECTOMY;  Surgeon: Elam Dutch, MD;  Location: [facility];  Service: Vascular;  Laterality: Right;  . left  lowerlung lobectomy Left 06/19/2006   Dr.Burney,Left lower lobectomy  . MYRINGOTOMY Bilateral 06/25/2016  . Porta-cath  2011  Social History   Occupational History  . Not on file  Tobacco Use  . Smoking status: Never Smoker  . Smokeless tobacco: Never Used  Substance and Sexual Activity  . Alcohol use: No  . Drug use: No  . Sexual activity: Not Currently

## 2019-05-26 ENCOUNTER — Other Ambulatory Visit: Payer: Self-pay

## 2019-05-26 ENCOUNTER — Inpatient Hospital Stay (HOSPITAL_BASED_OUTPATIENT_CLINIC_OR_DEPARTMENT_OTHER): Payer: Medicare Other | Admitting: Internal Medicine

## 2019-05-26 ENCOUNTER — Encounter: Payer: Self-pay | Admitting: Internal Medicine

## 2019-05-26 ENCOUNTER — Other Ambulatory Visit: Payer: Self-pay | Admitting: Family Medicine

## 2019-05-26 ENCOUNTER — Inpatient Hospital Stay: Payer: Medicare Other | Attending: Internal Medicine

## 2019-05-26 ENCOUNTER — Inpatient Hospital Stay: Payer: Medicare Other

## 2019-05-26 ENCOUNTER — Inpatient Hospital Stay: Payer: Medicare Other | Admitting: Nutrition

## 2019-05-26 VITALS — BP 129/63 | HR 49 | Temp 98.0°F | Resp 20 | Ht 62.0 in | Wt 104.2 lb

## 2019-05-26 DIAGNOSIS — R0602 Shortness of breath: Secondary | ICD-10-CM | POA: Diagnosis not present

## 2019-05-26 DIAGNOSIS — C3411 Malignant neoplasm of upper lobe, right bronchus or lung: Secondary | ICD-10-CM | POA: Insufficient documentation

## 2019-05-26 DIAGNOSIS — Z7901 Long term (current) use of anticoagulants: Secondary | ICD-10-CM | POA: Insufficient documentation

## 2019-05-26 DIAGNOSIS — C3491 Malignant neoplasm of unspecified part of right bronchus or lung: Secondary | ICD-10-CM

## 2019-05-26 DIAGNOSIS — Z95828 Presence of other vascular implants and grafts: Secondary | ICD-10-CM

## 2019-05-26 DIAGNOSIS — R05 Cough: Secondary | ICD-10-CM | POA: Diagnosis not present

## 2019-05-26 DIAGNOSIS — Z9221 Personal history of antineoplastic chemotherapy: Secondary | ICD-10-CM | POA: Insufficient documentation

## 2019-05-26 DIAGNOSIS — Z79899 Other long term (current) drug therapy: Secondary | ICD-10-CM | POA: Insufficient documentation

## 2019-05-26 DIAGNOSIS — C3492 Malignant neoplasm of unspecified part of left bronchus or lung: Secondary | ICD-10-CM

## 2019-05-26 DIAGNOSIS — E7800 Pure hypercholesterolemia, unspecified: Secondary | ICD-10-CM | POA: Diagnosis not present

## 2019-05-26 DIAGNOSIS — I2510 Atherosclerotic heart disease of native coronary artery without angina pectoris: Secondary | ICD-10-CM | POA: Insufficient documentation

## 2019-05-26 DIAGNOSIS — Z86718 Personal history of other venous thrombosis and embolism: Secondary | ICD-10-CM | POA: Diagnosis not present

## 2019-05-26 DIAGNOSIS — C3490 Malignant neoplasm of unspecified part of unspecified bronchus or lung: Secondary | ICD-10-CM

## 2019-05-26 DIAGNOSIS — E2839 Other primary ovarian failure: Secondary | ICD-10-CM

## 2019-05-26 DIAGNOSIS — Z5112 Encounter for antineoplastic immunotherapy: Secondary | ICD-10-CM

## 2019-05-26 DIAGNOSIS — S32409A Unspecified fracture of unspecified acetabulum, initial encounter for closed fracture: Secondary | ICD-10-CM | POA: Diagnosis not present

## 2019-05-26 DIAGNOSIS — I4891 Unspecified atrial fibrillation: Secondary | ICD-10-CM | POA: Diagnosis not present

## 2019-05-26 LAB — CBC WITH DIFFERENTIAL (CANCER CENTER ONLY)
Abs Immature Granulocytes: 0.01 10*3/uL (ref 0.00–0.07)
Basophils Absolute: 0 10*3/uL (ref 0.0–0.1)
Basophils Relative: 1 %
Eosinophils Absolute: 0 10*3/uL (ref 0.0–0.5)
Eosinophils Relative: 0 %
HCT: 38.3 % (ref 36.0–46.0)
Hemoglobin: 12.6 g/dL (ref 12.0–15.0)
Immature Granulocytes: 0 %
Lymphocytes Relative: 38 %
Lymphs Abs: 1.5 10*3/uL (ref 0.7–4.0)
MCH: 32.1 pg (ref 26.0–34.0)
MCHC: 32.9 g/dL (ref 30.0–36.0)
MCV: 97.5 fL (ref 80.0–100.0)
Monocytes Absolute: 0.3 10*3/uL (ref 0.1–1.0)
Monocytes Relative: 8 %
Neutro Abs: 2.1 10*3/uL (ref 1.7–7.7)
Neutrophils Relative %: 53 %
Platelet Count: 288 10*3/uL (ref 150–400)
RBC: 3.93 MIL/uL (ref 3.87–5.11)
RDW: 15.1 % (ref 11.5–15.5)
WBC Count: 4 10*3/uL (ref 4.0–10.5)
nRBC: 0 % (ref 0.0–0.2)

## 2019-05-26 LAB — CMP (CANCER CENTER ONLY)
ALT: 34 U/L (ref 0–44)
AST: 44 U/L — ABNORMAL HIGH (ref 15–41)
Albumin: 3 g/dL — ABNORMAL LOW (ref 3.5–5.0)
Alkaline Phosphatase: 121 U/L (ref 38–126)
Anion gap: 9 (ref 5–15)
BUN: 16 mg/dL (ref 8–23)
CO2: 26 mmol/L (ref 22–32)
Calcium: 8.5 mg/dL — ABNORMAL LOW (ref 8.9–10.3)
Chloride: 101 mmol/L (ref 98–111)
Creatinine: 0.81 mg/dL (ref 0.44–1.00)
GFR, Est AFR Am: >60 mL/min (ref >60)
GFR, Estimated: >60 mL/min (ref >60)
Glucose, Bld: 125 mg/dL — ABNORMAL HIGH (ref 70–99)
Potassium: 3.6 mmol/L (ref 3.5–5.1)
Sodium: 136 mmol/L (ref 135–145)
Total Bilirubin: 0.4 mg/dL (ref 0.3–1.2)
Total Protein: 7.2 g/dL (ref 6.5–8.1)

## 2019-05-26 LAB — TSH: TSH: 4.8 u[IU]/mL — ABNORMAL HIGH (ref 0.308–3.960)

## 2019-05-26 MED ORDER — SODIUM CHLORIDE 0.9 % IV SOLN
480.0000 mg | Freq: Once | INTRAVENOUS | Status: AC
  Administered 2019-05-26: 11:00:00 480 mg via INTRAVENOUS
  Filled 2019-05-26: qty 48

## 2019-05-26 MED ORDER — SODIUM CHLORIDE 0.9% FLUSH
10.0000 mL | INTRAVENOUS | Status: DC | PRN
  Administered 2019-05-26: 10 mL
  Filled 2019-05-26: qty 10

## 2019-05-26 MED ORDER — SODIUM CHLORIDE 0.9% FLUSH
10.0000 mL | INTRAVENOUS | Status: DC | PRN
  Administered 2019-05-26: 09:00:00 10 mL
  Filled 2019-05-26: qty 10

## 2019-05-26 MED ORDER — HEPARIN SOD (PORK) LOCK FLUSH 100 UNIT/ML IV SOLN
500.0000 [IU] | Freq: Once | INTRAVENOUS | Status: AC | PRN
  Administered 2019-05-26: 11:00:00 500 [IU]
  Filled 2019-05-26: qty 5

## 2019-05-26 MED ORDER — SODIUM CHLORIDE 0.9 % IV SOLN
Freq: Once | INTRAVENOUS | Status: AC
  Filled 2019-05-26: qty 250

## 2019-05-26 NOTE — Progress Notes (Signed)
Nutrition follow-up completed with patient during infusion for recurrent non-small cell lung cancer. Patient has lost weight.  She weighed 104.2 pounds December 23 down from 109.6 pounds on October 28. She has been drinking two Carnation breakfast essentials daily.  She mixes this with whole milk. Reports taste alterations. She has a poor appetite but is trying to eat small amounts throughout the day. She denies other nutrition impact symptoms.  Nutrition diagnosis: Inadequate oral intake continues.  Intervention: Educated patient to increase oral intake throughout the day. Reminded her to focus on calories and protein. Recommended she add 1 Ensure Enlive daily and continue 2 Carnation breakfast essentials daily along with oral intake to minimize further weight loss. Provided 1 complementary case of Ensure Enlive. Teach back used.  Monitoring, evaluation, goals: Patient will tolerate increased calories and protein to minimize further weight loss.  Next visit: To be scheduled as needed.  **Disclaimer: This note was dictated with voice recognition software. Similar sounding words can inadvertently be transcribed and this note may contain transcription errors which may not have been corrected upon publication of note.**

## 2019-05-26 NOTE — Patient Instructions (Signed)
[facility] Discharge Instructions for Patients Receiving Chemotherapy  Today you received the following chemotherapy agents Opdivo  To help prevent nausea and vomiting after your treatment, we encourage you to take your nausea medication as directed   If you develop nausea and vomiting that is not controlled by your nausea medication, call the clinic.   BELOW ARE SYMPTOMS THAT SHOULD BE REPORTED IMMEDIATELY:  *FEVER GREATER THAN 100.5 F  *CHILLS WITH OR WITHOUT FEVER  NAUSEA AND VOMITING THAT IS NOT CONTROLLED WITH YOUR NAUSEA MEDICATION  *UNUSUAL SHORTNESS OF BREATH  *UNUSUAL BRUISING OR BLEEDING  TENDERNESS IN MOUTH AND THROAT WITH OR WITHOUT PRESENCE OF ULCERS  *URINARY PROBLEMS  *BOWEL PROBLEMS  UNUSUAL RASH Items with * indicate a potential emergency and should be followed up as soon as possible.  Feel free to call the clinic should you have any questions or concerns. The clinic phone number is [phone].  Please show the CHEMO ALERT CARD at check-in to the Emergency Department and triage nurse.   

## 2019-05-26 NOTE — Progress Notes (Signed)
[facility] [phone]   [phone]  OFFICE PROGRESS NOTE  [name], MD [place] [address]  DIAGNOSIS: Recurrent non-small cell lung cancer, adenocarcinoma initially diagnosed as stage IB (T2, N0, M0) in June 2008 with tumor size of 8.0 cm.  PRIOR THERAPY: #1 status post left upper lobectomy with lymph node dissection under the care of Dr. Arlyce Dice on 06/29/2006.  #2 status post 4 cycles of adjuvant chemotherapy with cisplatin and Taxotere last dose given 08/01/2006.  #3 status post 6 cycles of systemic chemotherapy with carboplatin and Alimta for disease recurrence last dose given 12/12/2009.  #4 palliative radiotherapy to the enlarging right upper lobe lung mass under the care of Dr. Pablo Ledger completed on 11/14/2015. #5 Maintenance systemic chemotherapy with single agent Alimta 400 MG/M2 every 3 weeks, status post 113 cycles.  CURRENT THERAPY: Second line treatment with immunotherapy with Nivolumab 480 mg IV every 4 weeks.  First dose 09/17/2017.  Status post 21 cycles.  INTERVAL HISTORY: [name] 85 y.o. female returns to the clinic today for follow-up visit.  The patient is feeling fine today with no concerning complaints except for fatigue as well as shortness of breath and dry cough.  She had a fall in November 2020 and had acetabular as well as inferior pubic ramus fracture that managed observed actively.  She was also found to have deep venous thrombosis of the right upper extremity and she is currently on treatment with Eliquis 5 mg p.o. twice daily.  She underwent rehabilitation at the [facility] skilled nursing facility.  She denied having any current chest pain or hemoptysis.  She denied having any nausea, vomiting, diarrhea or constipation.  She has no headache or visual changes.  The patient had repeat CT scan of the chest performed recently and she is here for evaluation and discussion of her scan  results.  MEDICAL HISTORY: Past Medical History:  Diagnosis Date  . Atrial fibrillation ([facility])   . FHx: chemotherapy 2008&2011   4 cycles cisplatin,taxotere,2008& carboplatin and Alimta 2011  . History of migraine headaches   . Hypercholesterolemia   . lung ca dx'd 06/2006   rt and lt lung  . Lung cancer ([place]) 3/14/1   right- Adenocarcinoma w/bronchioalveolar features  . Radiation 11/01/15-11/14/15   right upper lobe 30 gray  . Radiation pneumonitis ([place]) 01/24/2016    ALLERGIES:  is allergic to simvastatin.  MEDICATIONS:  Current Outpatient Medications  Medication Sig Dispense Refill  . acetaminophen (TYLENOL) 325 MG tablet Take 2 tablets (650 mg total) by mouth every 12 (twelve) hours as needed for mild pain (or Fever >/= 101).    Marland Kitchen acetaminophen (TYLENOL) 325 MG tablet Take 2 tablets (650 mg total) by mouth 2 (two) times daily.    Marland Kitchen albuterol (PROVENTIL HFA;VENTOLIN HFA) 108 (90 Base) MCG/ACT inhaler Inhale 1-2 puffs into the lungs every 6 (six) hours as needed for wheezing or shortness of breath. 1 Inhaler 2  . Amino Acids-Protein Hydrolys (FEEDING SUPPLEMENT, PRO-STAT SUGAR FREE 64,) LIQD Take 30 mLs by mouth daily. 887 mL 0  . amiodarone (PACERONE) 200 MG tablet Take 1 tablet (200 mg total) by mouth daily.    Marland Kitchen apixaban (ELIQUIS) 2.5 MG TABS tablet Take 1 tablet (2.5 mg total) by mouth 2 (two) times daily. 60 tablet 1  . chlorpheniramine-HYDROcodone (TUSSIONEX) 10-8 MG/5ML SUER Take 5 mLs by mouth 2 (two) times daily as needed for cough. 15 mL 0  . metoprolol  succinate (TOPROL-XL) 50 MG 24 hr tablet Take 1 tablet (50 mg total) by mouth daily. Take with or immediately following a meal.  Refills per cardiology or primary care 30 tablet 1  . Multiple Vitamin (MULTIVITAMIN WITH MINERALS) TABS tablet Take 1 tablet by mouth daily.    . vitamin C (ASCORBIC ACID) 500 MG tablet Take 500 mg by mouth daily.     No current facility-administered medications for this visit.    SURGICAL  HISTORY:  Past Surgical History:  Procedure Laterality Date  . CATARACT EXTRACTION  2013   bilateral  . EMBOLECTOMY Right 03/13/2019   Procedure: RIGHT BRACHIAL, RADIAL, ULNAR EMBOLECTOMY;  Surgeon: Elam Dutch, MD;  Location: [facility] OR;  Service: Vascular;  Laterality: Right;  . left  lowerlung lobectomy Left 06/19/2006   Dr.Burney,Left lower lobectomy  . MYRINGOTOMY Bilateral 06/25/2016  . Porta-cath  2011    REVIEW OF SYSTEMS:  Constitutional: positive for fatigue Eyes: negative Ears, nose, mouth, throat, and face: negative Respiratory: positive for cough and dyspnea on exertion Cardiovascular: negative Gastrointestinal: negative Genitourinary:negative Integument/breast: negative Hematologic/lymphatic: negative Musculoskeletal:negative Neurological: negative Behavioral/Psych: negative Endocrine: negative Allergic/Immunologic: negative   PHYSICAL EXAMINATION: General appearance: alert, cooperative and no distress Head: Normocephalic, without obvious abnormality, atraumatic Neck: no adenopathy, no JVD, supple, symmetrical, trachea midline and thyroid not enlarged, symmetric, no tenderness/mass/nodules Lymph nodes: Cervical, supraclavicular, and axillary nodes normal. Resp: clear to auscultation bilaterally Back: symmetric, no curvature. ROM normal. No CVA tenderness. Cardio: regular rate and rhythm, S1, S2 normal, no murmur, click, rub or gallop and normal apical impulse GI: soft, non-tender; bowel sounds normal; no masses,  no organomegaly Extremities: extremities normal, atraumatic, no cyanosis or edema Neurologic: Alert and oriented X 3, normal strength and tone. Normal symmetric reflexes. Normal coordination and gait  ECOG PERFORMANCE STATUS: 1 - Symptomatic but completely ambulatory  Blood pressure 129/63, pulse (!) 49, temperature 98 F (36.7 C), temperature source Temporal, resp. rate 20, height 5\' 2"  (1.575 m), weight 104 lb 3.2 oz (47.3 kg), SpO2 100  %.  LABORATORY DATA: Lab Results  Component Value Date   WBC 4.0 05/26/2019   HGB 12.6 05/26/2019   HCT 38.3 05/26/2019   MCV 97.5 05/26/2019   PLT 288 05/26/2019      Chemistry      Component Value Date/Time   NA 137 04/16/2019 0303   NA 142 05/28/2017 1015   K 3.8 04/16/2019 0303   K 4.2 05/28/2017 1015   CL 104 04/16/2019 0303   CL 108 (H) 11/04/2012 0947   CO2 27 04/16/2019 0303   CO2 23 05/28/2017 1015   BUN 15 04/16/2019 0303   BUN 14.2 05/28/2017 1015   CREATININE 0.84 04/16/2019 0303   CREATININE 0.87 03/31/2019 1013   CREATININE 0.9 05/28/2017 1015      Component Value Date/Time   CALCIUM 8.3 (L) 04/16/2019 0303   CALCIUM 9.0 05/28/2017 1015   ALKPHOS 76 03/31/2019 1013   ALKPHOS 84 05/28/2017 1015   AST 16 03/31/2019 1013   AST 19 05/28/2017 1015   ALT 8 03/31/2019 1013   ALT 7 05/28/2017 1015   BILITOT 0.5 03/31/2019 1013   BILITOT 0.54 05/28/2017 1015       RADIOGRAPHIC STUDIES: CT Chest Wo Contrast  Result Date: 05/24/2019 CLINICAL DATA:  Lung cancer post completion of chemotherapy and radiation therapy. Immunotherapy ongoing. Chronic cough and shortness of breath on exertion. EXAM: CT CHEST WITHOUT CONTRAST TECHNIQUE: Multidetector CT imaging of the chest was performed following the  standard protocol without IV contrast. COMPARISON:  CT chest 01/04/2019 and 08/17/2018. Older comparison studies are correlated as well, including a PET-CT 12/24/2013. FINDINGS: Cardiovascular: Diffuse three-vessel coronary artery atherosclerosis with lesser involvement of the aorta and great vessels. Right IJ Port-A-Cath extends to the lower SVC level. There are calcifications of the aortic valve. The heart size is normal. There is no pericardial effusion. Mediastinum/Nodes: There are no enlarged mediastinal, hilar or axillary lymph nodes.Hilar assessment is limited by the lack of intravenous contrast, although the hilar contours appear unchanged. The trachea and esophagus  appear unchanged. Lungs/Pleura: Stable trace right pleural effusion. Status post left lower lobe resection. The large partially calcified mass-like area of consolidation medially in the right upper hemithorax appears more prominent, but difficult to accurately measure due to adjacent pulmonary parenchymal opacities. It measures approximately 7.4 x 4.4 cm on image 53/2. Associated right upper lobe bronchial occlusion is chronic and unchanged. The ill-defined paraspinal right lower lobe nodule has enlarged, measuring 2.0 x 1.5 cm on image 73/7 (previously 1.0 cm). The multiple ground-glass and part solid nodular densities in the left lung are stable. The fibrotic changes posteriorly at the left lung base are stable and do not appear mass-like. Upper abdomen: The visualized upper abdomen appears stable without suspicious findings. Musculoskeletal/Chest wall: There is no chest wall mass or suspicious osseous finding. IMPRESSION: 1. The large partially calcified mass-like area of consolidation medially in the right upper hemithorax appears larger, although difficult to accurately measure due to adjacent pulmonary parenchymal opacities. This could indicate local recurrence. Consider follow-up PET-CT for further characterization. 2. The ill-defined paraspinal right lower lobe nodule has enlarged. The multiple left lung ground-glass and part solid nodules have not significantly changed. No adenopathy or change in small right pleural effusion. 3. Coronary and Aortic Atherosclerosis (ICD10-I70.0). Electronically Signed   By: Richardean Sale M.D.   On: 05/24/2019 15:20   XR HIP UNILAT W OR W/O PELVIS 2-3 VIEWS RIGHT  Result Date: 05/25/2019 X-rays demonstrate continued healing right acetabular fracture without any interval displacement  XR Pelvis 1-2 Views  Result Date: 05/04/2019 Stable right acetabular fracture without any interval displacement or worsening.   ASSESSMENT AND PLAN:  This is a very pleasant 85  years old white female with recurrent non-small cell lung cancer, adenocarcinoma status post induction systemic chemotherapy with carboplatin and Alimta with partial response. She is currently on maintenance treatment with single agent Alimta status post 113 cycles. The patient has been tolerating this treatment well with no concerning complaints. Unfortunately restaging scan after cycle 113 showed evidence for disease progression. The patient was started on second line treatment with immunotherapy with Nivolumab 480 mg IV every 4 weeks status post 21 cycles. The patient continues to tolerate this treatment well with no concerning adverse effects. She had repeat CT scan of the chest performed recently.  I personally and independently reviewed the scans and discussed the results with the patient today. Her scan showed no concerning findings for disease progression but there was increased consolidation in the right hilar area suspicious for progressive disease.  I recommended for the patient to have a PET scan for further evaluation of this lesion and to see if there is any need to change her treatment. For the dry cough, she will continue on Tussionex.  I gave her a refill today. For the deep venous thrombosis, she will continue on Eliquis 5 mg p.o. twice daily for now. The patient will come back for follow-up visit in 4 weeks for  evaluation before the next cycle of her treatment. The patient voices understanding of current disease status and treatment options and is in agreement with the current care plan. All questions were answered. The patient knows to call the clinic with any problems, questions or concerns. We can certainly see the patient much sooner if necessary.  Disclaimer: This note was dictated with voice recognition software. Similar sounding words can inadvertently be transcribed and may not be corrected upon review.

## 2019-05-27 ENCOUNTER — Telehealth: Payer: Self-pay | Admitting: Internal Medicine

## 2019-05-27 NOTE — Telephone Encounter (Signed)
Scheduled appt per 12/23 los.  Spoke with pt and her daughter and they are aware of the appt dates and time.

## 2019-06-02 ENCOUNTER — Ambulatory Visit: Payer: Medicare Other | Attending: Internal Medicine

## 2019-06-02 DIAGNOSIS — Z20822 Contact with and (suspected) exposure to covid-19: Secondary | ICD-10-CM

## 2019-06-02 IMAGING — CT CT CHEST W/O CM
2 of 3 series · 15 of 36 positions shown, 18 images · non-contrast
Comparison: Chest CT 01/20/2017 and 10/29/2016.

CLINICAL DATA: Recurrent non-small cell lung cancer
(adenocarcinoma) initially diagnosed stage IB in November 2006.
Subsequent recurrence with systemic chemotherapy in 7522 and
palliative radiotherapy completed 11/14/2015.

EXAM:
CT CHEST WITHOUT CONTRAST
TECHNIQUE: Multidetector CT imaging of the chest was performed following the
standard protocol without IV contrast.

[Series 2: thorax · axial · 0.58mm/px · z∈[-302,-66]mm · 12 of 140 slices shown, 15 images]
[im 11/140  mediastinal]
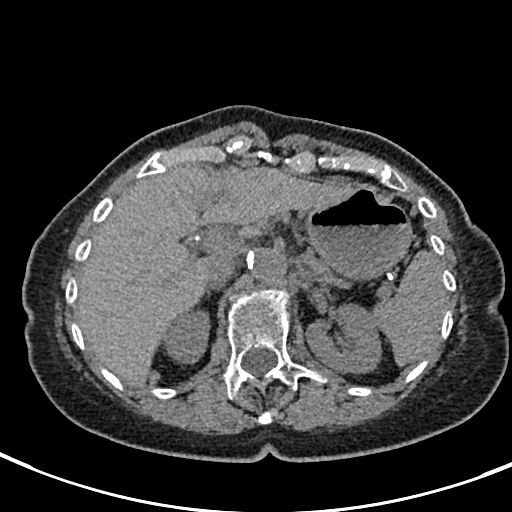
[im 11/140  lung]
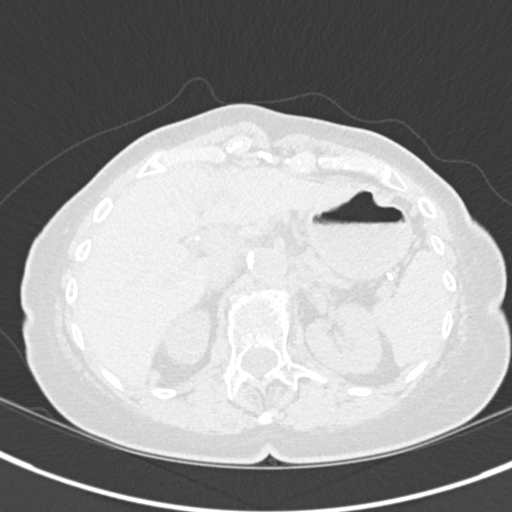
[im 21/140  lung]
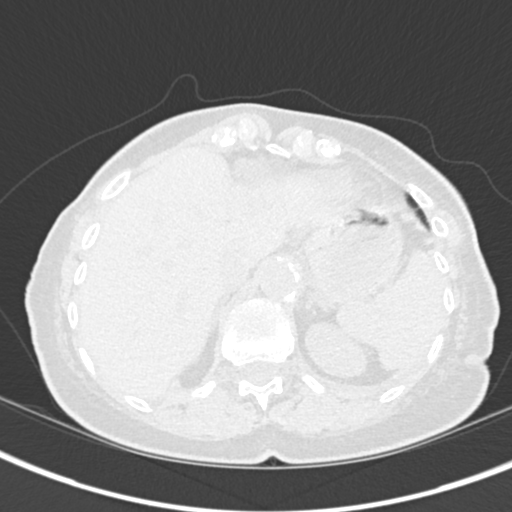
[im 31/140  lung]
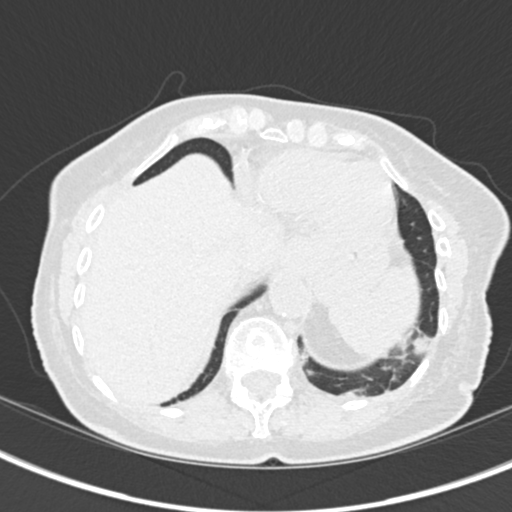
[im 42/140  lung]
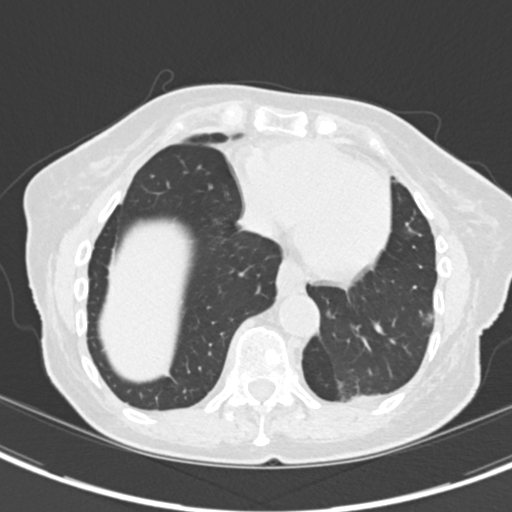
[im 52/140  mediastinal]
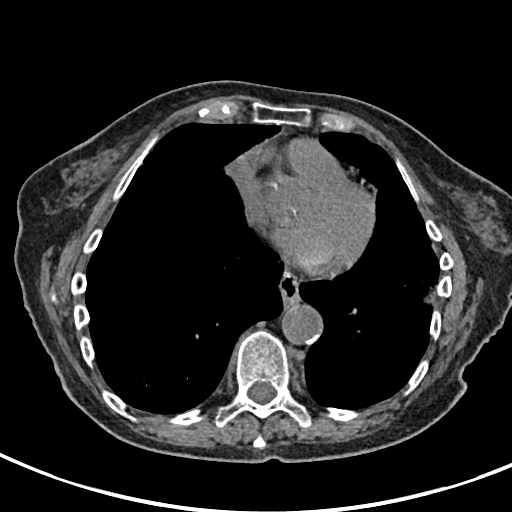
[im 52/140  lung]
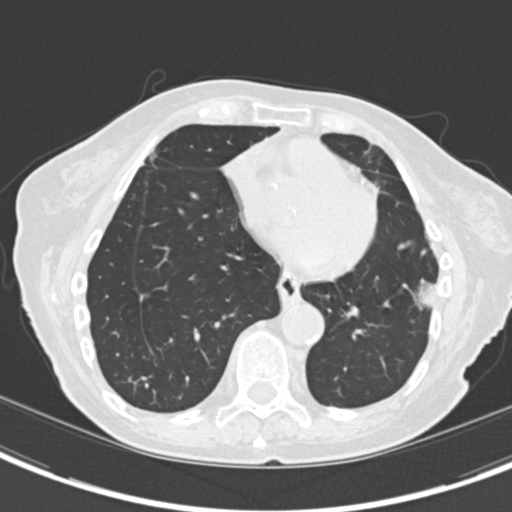
[im 62/140  lung]
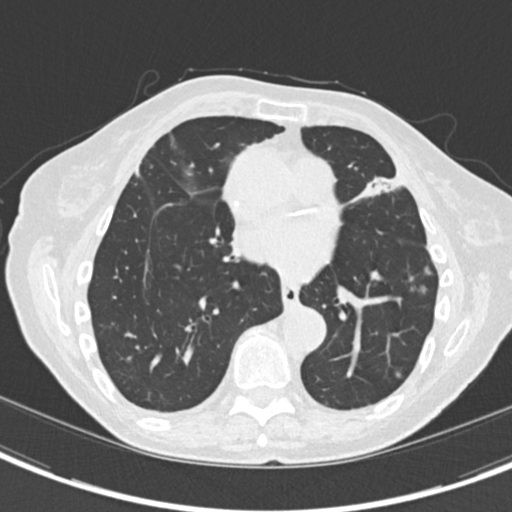
[im 78/140  lung]
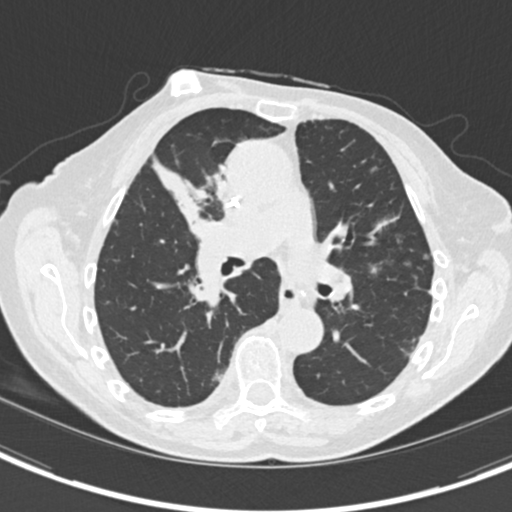
[im 88/140  lung]
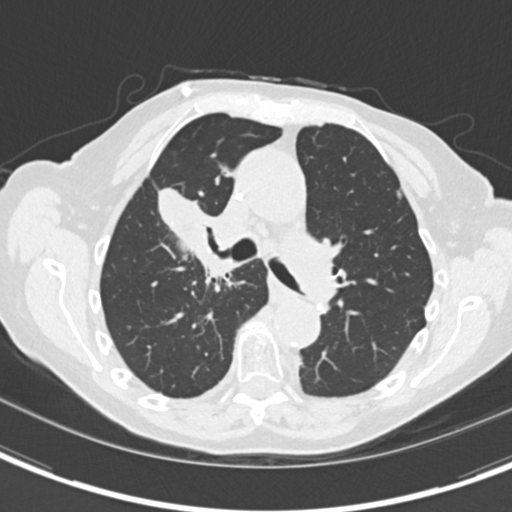
[im 98/140  mediastinal]
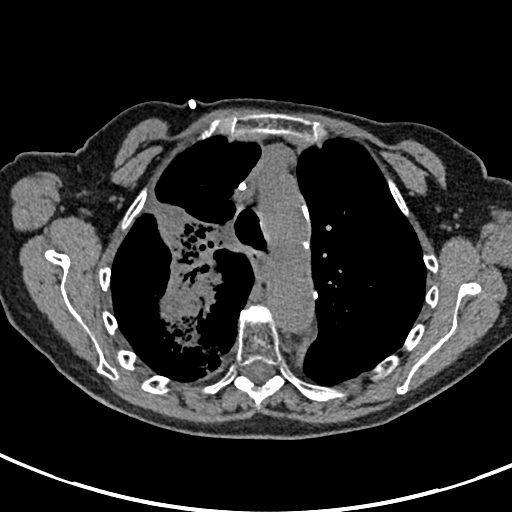
[im 98/140  lung]
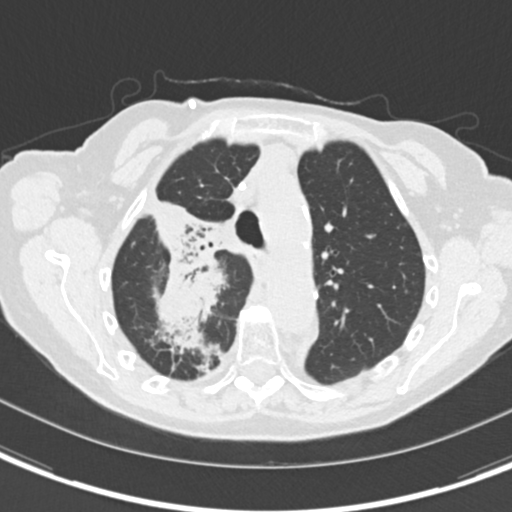
[im 109/140  lung]
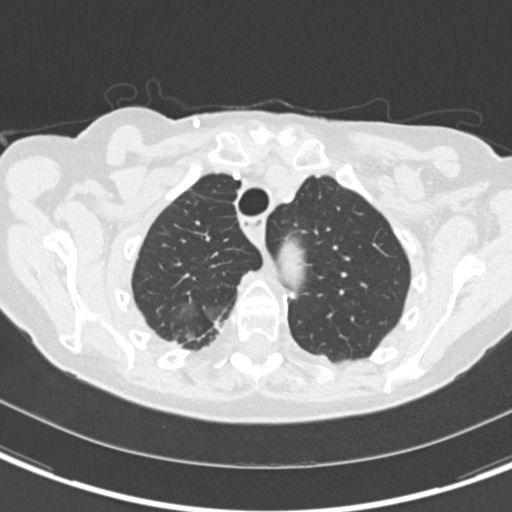
[im 119/140  lung]
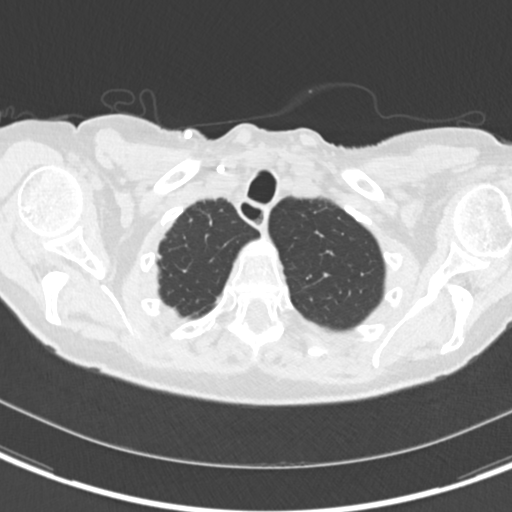
[im 129/140  lung]
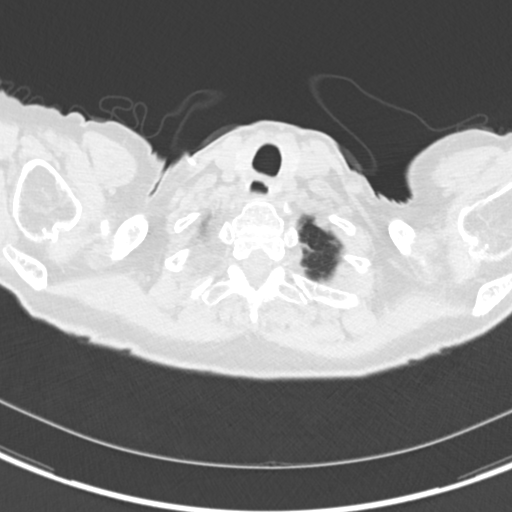

[Series 6: coronal · coronal · 0.56mm/px · 3 of 151 slices shown]
[im 31/151  lung]
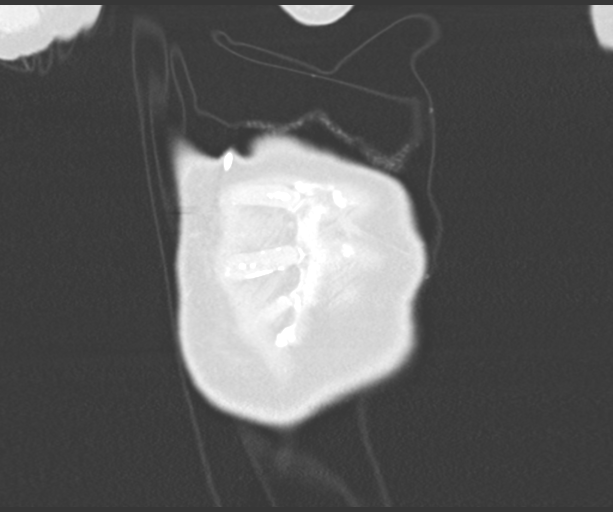
[im 61/151  lung]
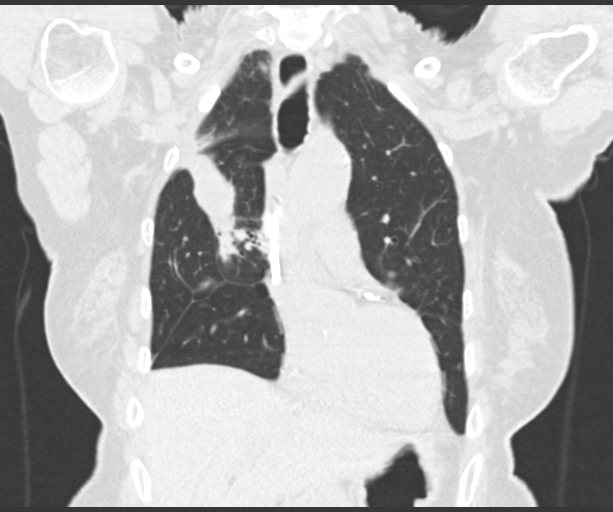
[im 91/151  lung]
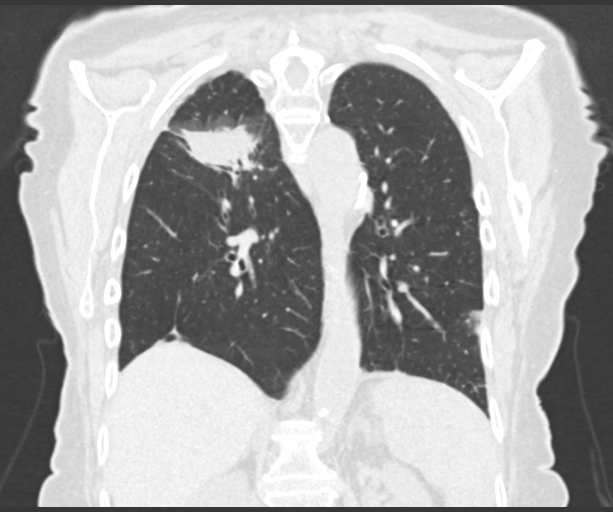

[15 of 36 positions shown; findings below may reference images not displayed]

FINDINGS: Cardiovascular: Similar atherosclerosis of the aorta, great vessels
and coronary arteries. The ascending aorta measures 3.9 cm in
diameter. Right IJ Port-A-Cath extends to the lower SVC. The heart
size is normal. There is no pericardial effusion.

Mediastinum/Nodes: Stable soft tissue fullness around the right
hilum without discrete adenopathy. No mediastinal or axillary
lymphadenopathy. The thyroid gland, trachea and esophagus
demonstrate no significant findings.

Lungs/Pleura: There is no pleural effusion. Remote left lower lobe
resection. Stable focal density with volume loss and bronchiectasis
in the lingula, measuring up to 1.8 x 1.7 cm on image 83, possibly
related to previous radiation therapy. There are multiple new
ill-defined nodular densities in the lower left hemithorax,
measuring up to 13 mm on image 89. There are evolving radiation
changes right suprahilar region with slightly increased
consolidation. Right middle lobe volume loss and opacity are similar
to previous studies. There is a new ill-defined subpleural density
medially in the right lower lobe adjacent to the spine, measuring 8
mm on image 61. No discrete pulmonary nodularity.

Upper abdomen: The visualized upper abdomen appears stable without
suspicious findings.

Musculoskeletal/Chest wall: There is no chest wall mass or
suspicious osseous finding. Stable chronic Schmorl's nodes in
thoracic spine.
IMPRESSION: 1. Evolving radiation changes in the right suprahilar region.
2. Interval development of multiple ill-defined nodular densities at
the left lung base status post remote left lower lobe resection.
Similar single component medially in the right lower lobe.
Multiplicity and ill-defined nature favor inflammation as the
etiology. Continued follow-up recommended.
3. Chronic right middle lobe and lingular opacities are stable from
recent prior studies.
4. No typical metastatic disease, adenopathy or pleural effusion.

## 2019-06-03 LAB — NOVEL CORONAVIRUS, NAA: SARS-CoV-2, NAA: DETECTED — AB

## 2019-06-09 ENCOUNTER — Ambulatory Visit (HOSPITAL_COMMUNITY): Payer: Medicare PPO

## 2019-06-10 ENCOUNTER — Telehealth: Payer: Self-pay | Admitting: *Deleted

## 2019-06-10 NOTE — Telephone Encounter (Signed)
Received call from daughter today asking for refill on cough med.-Tussionex  She reports that pt has chronic cough & also wants to know if she can take an OTC cough med during the day.  Informed that should be OK & to check with her pharmacist.  She wants to make sure Dr Julien Nordmann knows that pt tested positive to Covid but doing OK.  O2 sat 97 % & has loss of taste & smell but not new & no fever.  Message routed to Dr Julien Nordmann.

## 2019-06-11 ENCOUNTER — Other Ambulatory Visit: Payer: Self-pay | Admitting: Physician Assistant

## 2019-06-11 ENCOUNTER — Ambulatory Visit: Payer: Medicare Other | Admitting: Cardiovascular Disease

## 2019-06-11 DIAGNOSIS — R059 Cough, unspecified: Secondary | ICD-10-CM

## 2019-06-11 DIAGNOSIS — R05 Cough: Secondary | ICD-10-CM

## 2019-06-11 DIAGNOSIS — C3491 Malignant neoplasm of unspecified part of right bronchus or lung: Secondary | ICD-10-CM

## 2019-06-11 DIAGNOSIS — J700 Acute pulmonary manifestations due to radiation: Secondary | ICD-10-CM

## 2019-06-11 MED ORDER — HYDROCOD POLST-CPM POLST ER 10-8 MG/5ML PO SUER: 5.0000 mL | Freq: Two times a day (BID) | ORAL | 0 refills | Status: DC | PRN

## 2019-06-14 ENCOUNTER — Telehealth: Payer: Self-pay | Admitting: Orthopaedic Surgery

## 2019-06-14 MED ORDER — AMIODARONE HCL 200 MG PO TABS: 200.0000 mg | ORAL_TABLET | Freq: Every day | ORAL | 0 refills | Status: DC

## 2019-06-14 NOTE — Telephone Encounter (Signed)
Received call from Shemena-(PT) with [facility] advised did start of care today due to insurance change and will need verbal order for 1 wk 1 and 2 Wk 2 for Strenghtening, balance, coordination, gait training to prevent falls in the home. The number to contact Earnestine Leys is [phone]

## 2019-06-14 NOTE — Telephone Encounter (Signed)
Called to approve orders 

## 2019-06-16 ENCOUNTER — Encounter: Payer: Self-pay | Admitting: Internal Medicine

## 2019-06-16 ENCOUNTER — Other Ambulatory Visit: Payer: Self-pay

## 2019-06-16 ENCOUNTER — Encounter (HOSPITAL_COMMUNITY)
Admission: RE | Admit: 2019-06-16 | Discharge: 2019-06-16 | Disposition: A | Discharge status: Home / Self Care | Payer: Medicare PPO | Source: Ambulatory Visit | Attending: Internal Medicine | Admitting: Internal Medicine

## 2019-06-16 DIAGNOSIS — I4891 Unspecified atrial fibrillation: Secondary | ICD-10-CM | POA: Diagnosis not present

## 2019-06-16 DIAGNOSIS — C3490 Malignant neoplasm of unspecified part of unspecified bronchus or lung: Secondary | ICD-10-CM | POA: Insufficient documentation

## 2019-06-16 DIAGNOSIS — Z7901 Long term (current) use of anticoagulants: Secondary | ICD-10-CM | POA: Diagnosis not present

## 2019-06-16 DIAGNOSIS — Z79899 Other long term (current) drug therapy: Secondary | ICD-10-CM | POA: Diagnosis not present

## 2019-06-16 LAB — GLUCOSE, CAPILLARY: Glucose-Capillary: 99 mg/dL (ref 70–99)

## 2019-06-16 MED ORDER — FLUDEOXYGLUCOSE F - 18 (FDG) INJECTION
5.6400 | Freq: Once | INTRAVENOUS | Status: AC | PRN
  Administered 2019-06-16: 5.64 via INTRAVENOUS

## 2019-06-17 ENCOUNTER — Telehealth: Payer: Self-pay | Admitting: Medical Oncology

## 2019-06-17 NOTE — Telephone Encounter (Signed)
Altered nutrition-appetite and taste- [name] is not eating because   food is too salty or too sweet to eat. She only eats 5-6 bites at a meal. She does not like Ensure.  She likes and drinks Carnation instant breakfast but it is not enough protein.   Dtr thinks she may benefit from appetite stimulant even with taste changes . Has f/u next week. Will ask to move up dietician appt.   Message to [facility] and dietician.

## 2019-06-23 ENCOUNTER — Emergency Department (HOSPITAL_COMMUNITY): Payer: Medicare PPO

## 2019-06-23 ENCOUNTER — Other Ambulatory Visit: Payer: Self-pay

## 2019-06-23 ENCOUNTER — Inpatient Hospital Stay (HOSPITAL_COMMUNITY)
Admission: EM | Admit: 2019-06-23 | Discharge: 2019-06-26 | DRG: 280 | Disposition: A | Discharge status: Home Health | Payer: Medicare PPO | Attending: Internal Medicine | Admitting: Internal Medicine

## 2019-06-23 DIAGNOSIS — Z9221 Personal history of antineoplastic chemotherapy: Secondary | ICD-10-CM | POA: Diagnosis not present

## 2019-06-23 DIAGNOSIS — C3491 Malignant neoplasm of unspecified part of right bronchus or lung: Secondary | ICD-10-CM | POA: Diagnosis present

## 2019-06-23 DIAGNOSIS — I25118 Atherosclerotic heart disease of native coronary artery with other forms of angina pectoris: Secondary | ICD-10-CM | POA: Diagnosis present

## 2019-06-23 DIAGNOSIS — I5023 Acute on chronic systolic (congestive) heart failure: Secondary | ICD-10-CM | POA: Diagnosis present

## 2019-06-23 DIAGNOSIS — I447 Left bundle-branch block, unspecified: Secondary | ICD-10-CM | POA: Diagnosis present

## 2019-06-23 DIAGNOSIS — Z79899 Other long term (current) drug therapy: Secondary | ICD-10-CM

## 2019-06-23 DIAGNOSIS — Z902 Acquired absence of lung [part of]: Secondary | ICD-10-CM

## 2019-06-23 DIAGNOSIS — Z9842 Cataract extraction status, left eye: Secondary | ICD-10-CM | POA: Diagnosis not present

## 2019-06-23 DIAGNOSIS — I340 Nonrheumatic mitral (valve) insufficiency: Secondary | ICD-10-CM | POA: Diagnosis not present

## 2019-06-23 DIAGNOSIS — I249 Acute ischemic heart disease, unspecified: Secondary | ICD-10-CM | POA: Diagnosis not present

## 2019-06-23 DIAGNOSIS — Z923 Personal history of irradiation: Secondary | ICD-10-CM

## 2019-06-23 DIAGNOSIS — U071 COVID-19: Secondary | ICD-10-CM

## 2019-06-23 DIAGNOSIS — Z79891 Long term (current) use of opiate analgesic: Secondary | ICD-10-CM | POA: Diagnosis not present

## 2019-06-23 DIAGNOSIS — Z7901 Long term (current) use of anticoagulants: Secondary | ICD-10-CM | POA: Diagnosis not present

## 2019-06-23 DIAGNOSIS — R0602 Shortness of breath: Secondary | ICD-10-CM

## 2019-06-23 DIAGNOSIS — R0902 Hypoxemia: Secondary | ICD-10-CM

## 2019-06-23 DIAGNOSIS — Z8616 Personal history of COVID-19: Secondary | ICD-10-CM | POA: Diagnosis present

## 2019-06-23 DIAGNOSIS — I2510 Atherosclerotic heart disease of native coronary artery without angina pectoris: Secondary | ICD-10-CM | POA: Diagnosis not present

## 2019-06-23 DIAGNOSIS — I4892 Unspecified atrial flutter: Secondary | ICD-10-CM | POA: Diagnosis present

## 2019-06-23 DIAGNOSIS — I214 Non-ST elevation (NSTEMI) myocardial infarction: Secondary | ICD-10-CM | POA: Diagnosis present

## 2019-06-23 DIAGNOSIS — Z888 Allergy status to other drugs, medicaments and biological substances status: Secondary | ICD-10-CM | POA: Diagnosis not present

## 2019-06-23 DIAGNOSIS — C3492 Malignant neoplasm of unspecified part of left bronchus or lung: Secondary | ICD-10-CM

## 2019-06-23 DIAGNOSIS — I5181 Takotsubo syndrome: Secondary | ICD-10-CM | POA: Diagnosis present

## 2019-06-23 DIAGNOSIS — Z9841 Cataract extraction status, right eye: Secondary | ICD-10-CM | POA: Diagnosis not present

## 2019-06-23 DIAGNOSIS — Z8249 Family history of ischemic heart disease and other diseases of the circulatory system: Secondary | ICD-10-CM | POA: Diagnosis not present

## 2019-06-23 DIAGNOSIS — E7800 Pure hypercholesterolemia, unspecified: Secondary | ICD-10-CM | POA: Diagnosis present

## 2019-06-23 DIAGNOSIS — I480 Paroxysmal atrial fibrillation: Secondary | ICD-10-CM | POA: Diagnosis present

## 2019-06-23 DIAGNOSIS — R778 Other specified abnormalities of plasma proteins: Secondary | ICD-10-CM | POA: Diagnosis not present

## 2019-06-23 DIAGNOSIS — R7989 Other specified abnormal findings of blood chemistry: Secondary | ICD-10-CM

## 2019-06-23 DIAGNOSIS — E785 Hyperlipidemia, unspecified: Secondary | ICD-10-CM | POA: Diagnosis present

## 2019-06-23 LAB — CBC WITH DIFFERENTIAL/PLATELET
Abs Immature Granulocytes: 0.05 10*3/uL (ref 0.00–0.07)
Basophils Absolute: 0 10*3/uL (ref 0.0–0.1)
Basophils Relative: 0 %
Eosinophils Absolute: 0 10*3/uL (ref 0.0–0.5)
Eosinophils Relative: 0 %
HCT: 44.9 % (ref 36.0–46.0)
Hemoglobin: 14.1 g/dL (ref 12.0–15.0)
Immature Granulocytes: 1 %
Lymphocytes Relative: 25 %
Lymphs Abs: 2.3 10*3/uL (ref 0.7–4.0)
MCH: 30.9 pg (ref 26.0–34.0)
MCHC: 31.4 g/dL (ref 30.0–36.0)
MCV: 98.5 fL (ref 80.0–100.0)
Monocytes Absolute: 0.5 10*3/uL (ref 0.1–1.0)
Monocytes Relative: 6 %
Neutro Abs: 6.1 10*3/uL (ref 1.7–7.7)
Neutrophils Relative %: 68 %
Platelets: 373 10*3/uL (ref 150–400)
RBC: 4.56 MIL/uL (ref 3.87–5.11)
RDW: 15.8 % — ABNORMAL HIGH (ref 11.5–15.5)
WBC: 9 10*3/uL (ref 4.0–10.5)
nRBC: 0 % (ref 0.0–0.2)

## 2019-06-23 LAB — COMPREHENSIVE METABOLIC PANEL
ALT: 38 U/L (ref 0–44)
AST: 45 U/L — ABNORMAL HIGH (ref 15–41)
Albumin: 3.1 g/dL — ABNORMAL LOW (ref 3.5–5.0)
Alkaline Phosphatase: 132 U/L — ABNORMAL HIGH (ref 38–126)
Anion gap: 9 (ref 5–15)
BUN: 20 mg/dL (ref 8–23)
CO2: 26 mmol/L (ref 22–32)
Calcium: 8.7 mg/dL — ABNORMAL LOW (ref 8.9–10.3)
Chloride: 99 mmol/L (ref 98–111)
Creatinine, Ser: 0.81 mg/dL (ref 0.44–1.00)
GFR calc Af Amer: >60 mL/min (ref >60)
GFR calc non Af Amer: >60 mL/min (ref >60)
Glucose, Bld: 192 mg/dL — ABNORMAL HIGH (ref 70–99)
Potassium: 4.1 mmol/L (ref 3.5–5.1)
Sodium: 134 mmol/L — ABNORMAL LOW (ref 135–145)
Total Bilirubin: 0.7 mg/dL (ref 0.3–1.2)
Total Protein: 7.6 g/dL (ref 6.5–8.1)

## 2019-06-23 LAB — TROPONIN I (HIGH SENSITIVITY)
Troponin I (High Sensitivity): 834 ng/L (ref <18)
Troponin I (High Sensitivity): 2935 ng/L (ref <18)

## 2019-06-23 MED ORDER — HYDROCOD POLST-CPM POLST ER 10-8 MG/5ML PO SUER
5.0000 mL | Freq: Two times a day (BID) | ORAL | Status: DC | PRN
  Administered 2019-06-25: 5 mL via ORAL
  Filled 2019-06-23: qty 5

## 2019-06-23 MED ORDER — ASPIRIN EC 81 MG PO TBEC
81.0000 mg | DELAYED_RELEASE_TABLET | Freq: Every day | ORAL | Status: DC
  Administered 2019-06-24: 11:00:00 81 mg via ORAL
  Filled 2019-06-23: qty 1

## 2019-06-23 MED ORDER — DEXTROMETHORPHAN POLISTIREX ER 30 MG/5ML PO SUER
15.0000 mg | ORAL | Status: DC | PRN
  Filled 2019-06-23: qty 5

## 2019-06-23 MED ORDER — ASPIRIN 81 MG PO CHEW
324.0000 mg | CHEWABLE_TABLET | ORAL | Status: AC
  Administered 2019-06-24: 324 mg via ORAL
  Filled 2019-06-23: qty 4

## 2019-06-23 MED ORDER — ACETAMINOPHEN 325 MG PO TABS: 650.0000 mg | ORAL_TABLET | ORAL | Status: DC | PRN

## 2019-06-23 MED ORDER — METOPROLOL SUCCINATE ER 50 MG PO TB24
50.0000 mg | ORAL_TABLET | Freq: Every day | ORAL | Status: DC
  Administered 2019-06-24 – 2019-06-26 (×3): 50 mg via ORAL
  Filled 2019-06-23 (×3): qty 1

## 2019-06-23 MED ORDER — NITROGLYCERIN 0.4 MG SL SUBL: 0.4000 mg | SUBLINGUAL_TABLET | SUBLINGUAL | Status: DC | PRN

## 2019-06-23 MED ORDER — ONDANSETRON HCL 4 MG/2ML IJ SOLN: 4.0000 mg | Freq: Four times a day (QID) | INTRAMUSCULAR | Status: DC | PRN

## 2019-06-23 MED ORDER — IOHEXOL 350 MG/ML SOLN
100.0000 mL | Freq: Once | INTRAVENOUS | Status: AC | PRN
  Administered 2019-06-23: 22:00:00 100 mL via INTRAVENOUS

## 2019-06-23 MED ORDER — ASPIRIN 300 MG RE SUPP: 300.0000 mg | RECTAL | Status: AC

## 2019-06-23 MED ORDER — AMIODARONE HCL 200 MG PO TABS
200.0000 mg | ORAL_TABLET | Freq: Every day | ORAL | Status: DC
  Administered 2019-06-24 – 2019-06-26 (×3): 200 mg via ORAL
  Filled 2019-06-23 (×3): qty 1

## 2019-06-23 MED ORDER — SODIUM CHLORIDE (PF) 0.9 % IJ SOLN
INTRAMUSCULAR | Status: AC
  Filled 2019-06-23: qty 50

## 2019-06-23 NOTE — ED Notes (Addendum)
Patients NRB removed. SPO2 remained 97%-100% RA.

## 2019-06-23 NOTE — ED Provider Notes (Signed)
[facility] DEPT Provider Note   CSN: [id number] Arrival date & time: 06/23/19  1849     History Chief Complaint  Patient presents with  . Shortness of Breath    [name] is a 86 y.o. female.  HPI Patient presents with shortness of breath.  Began acutely while at home.  Apparently had sats of 66% per EMS.  Started on CPAP and then switched to nonrebreather.  Now sats 100%.  Has had a cough is unchanged.  No chest pain.  History of lung cancer and is on Opdivo now.  Also on anticoagulation.  States she had a clot in her arm but also appears that history of atrial fibrillation.  No fevers.  Had been doing well earlier today and yesterday.  On December 30 she had a positive Covid test.  States that was very mild however.    Past Medical History:  Diagnosis Date  . Atrial fibrillation ([place])   . FHx: chemotherapy 2008&2011   4 cycles cisplatin,taxotere,2008& carboplatin and Alimta 2011  . History of migraine headaches   . Hypercholesterolemia   . lung ca dx'd 06/2006   rt and lt lung  . Lung cancer ([place]) 3/14/1   right- Adenocarcinoma w/bronchioalveolar features  . Radiation 11/01/15-11/14/15   right upper lobe 30 gray  . Radiation pneumonitis ([place]) 01/24/2016    Patient Active Problem List   Diagnosis Date Noted  . Acetabular fracture ([place]) 04/14/2019  . 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 Port-A-Cath in place 12/10/2017  . Goals of care, counseling/discussion 09/10/2017  . Encounter for antineoplastic immunotherapy 09/10/2017  . Bronchitis 08/16/2016  . Radiation pneumonitis ([place]) 01/24/2016  . Encounter for antineoplastic chemotherapy 12/14/2014  . Bilateral lung cancer ([place]) 04/08/2011    Past Surgical History:  Procedure Laterality Date  . CATARACT EXTRACTION  2013   bilateral  . EMBOLECTOMY Right 03/13/2019   Procedure: RIGHT BRACHIAL, RADIAL, ULNAR EMBOLECTOMY;  Surgeon: Elam Dutch, MD;  Location: [facility] OR;   Service: Vascular;  Laterality: Right;  . left  lowerlung lobectomy Left 06/19/2006   Dr.Burney,Left lower lobectomy  . MYRINGOTOMY Bilateral 06/25/2016  . Porta-cath  2011     OB History   No obstetric history on file.     Family History  Problem Relation Age of Onset  . Hypertension Father     Social History   Tobacco Use  . Smoking status: Never Smoker  . Smokeless tobacco: Never Used  Substance Use Topics  . Alcohol use: No  . Drug use: No    Home Medications Prior to Admission medications   Medication Sig Start Date End Date Taking? Authorizing Provider  amiodarone (PACERONE) 200 MG tablet Take 1 tablet (200 mg total) by mouth daily. 06/14/19  Yes Lorretta Harp, MD  apixaban (ELIQUIS) 2.5 MG TABS tablet Take 1 tablet (2.5 mg total) by mouth 2 (two) times daily. 03/16/19  Yes Ulyses Amor, [name]  chlorpheniramine-HYDROcodone (TUSSIONEX) 10-8 MG/5ML SUER Take 5 mLs by mouth 2 (two) times daily as needed for cough. 06/11/19  Yes Heilingoetter, Cassandra L, [name]  dextromethorphan (DELSYM) 30 MG/5ML liquid Take 15 mg by mouth as needed for cough.   Yes [provider]  metoprolol succinate (TOPROL-XL) 50 MG 24 hr tablet Take 1 tablet (50 mg total) by mouth daily. Take with or immediately following a meal.  Refills per cardiology or primary care 03/17/19  Yes Ulyses Amor, [name]  Multiple Vitamin (MULTIVITAMIN WITH MINERALS) TABS tablet  Take 1 tablet by mouth daily.   Yes [provider]  vitamin C (ASCORBIC ACID) 500 MG tablet Take 500 mg by mouth daily.   Yes [provider]    Allergies    Simvastatin  Review of Systems   Review of Systems  Constitutional: Negative for appetite change.  HENT: Negative for congestion.   Respiratory: Positive for shortness of breath.   Cardiovascular: Negative for chest pain.  Gastrointestinal: Negative for abdominal pain.  Genitourinary: Negative for dysuria.  Musculoskeletal: Negative for back pain.   Skin: Negative for rash.  Neurological: Negative for weakness.    Physical Exam Updated Vital Signs BP 123/78   Pulse 66   Temp (!) 97.5 F (36.4 C) (Oral)   Resp (!) 24   SpO2 96%   Physical Exam Vitals reviewed.  Cardiovascular:     Rate and Rhythm: Normal rate and regular rhythm.  Pulmonary:     Effort: Tachypnea present.     Breath sounds: Examination of the right-upper field reveals rhonchi. Examination of the left-upper field reveals rhonchi. Examination of the right-middle field reveals rhonchi. Examination of the left-middle field reveals rhonchi. Examination of the right-lower field reveals rhonchi. Examination of the left-lower field reveals rhonchi. Rhonchi present.  Musculoskeletal:     Cervical back: Neck supple.     Right lower leg: No tenderness. No edema.     Left lower leg: No tenderness. No edema.  Skin:    General: Skin is warm.     Capillary Refill: Capillary refill takes less than 2 seconds.  Neurological:     Mental Status: She is alert and oriented to person, place, and time.     ED Results / Procedures / Treatments   Labs (all labs ordered are listed, but only abnormal results are displayed) Labs Reviewed  COMPREHENSIVE METABOLIC PANEL - Abnormal; Notable for the following components:      Result Value   Sodium 134 (*)    Glucose, Bld 192 (*)    Calcium 8.7 (*)    Albumin 3.1 (*)    AST 45 (*)    Alkaline Phosphatase 132 (*)    All other components within normal limits  CBC WITH DIFFERENTIAL/PLATELET - Abnormal; Notable for the following components:   RDW 15.8 (*)    All other components within normal limits  TROPONIN I (HIGH SENSITIVITY) - Abnormal; Notable for the following components:   Troponin I (High Sensitivity) 834 (*)    All other components within normal limits  TROPONIN I (HIGH SENSITIVITY) - Abnormal; Notable for the following components:   Troponin I (High Sensitivity) 2,935 (*)    All other components within normal limits     EKG EKG Interpretation  Date/Time:  Wednesday June 23 2019 19:13:48 EST Ventricular Rate:  69 PR Interval:    QRS Duration: 127 QT Interval:  441 QTC Calculation: 473 R Axis:   -56 Text Interpretation: Sinus rhythm Left atrial enlargement Left bundle branch block Confirmed by Davonna Belling [phone]) on 06/23/2019 7:26:30 PM   Radiology CT Angio Chest PE W and/or Wo Contrast  Result Date: 06/23/2019 CLINICAL DATA:  Respiratory distress. EXAM: CT ANGIOGRAPHY CHEST WITH CONTRAST TECHNIQUE: Multidetector CT imaging of the chest was performed using the standard protocol during bolus administration of intravenous contrast. Multiplanar CT image reconstructions and MIPs were obtained to evaluate the vascular anatomy. CONTRAST:  133mL OMNIPAQUE IOHEXOL 350 MG/ML SOLN COMPARISON:  May 24, 2019 FINDINGS: Cardiovascular: A right-sided venous Port-A-Cath is in place. There  is moderate severity calcification of the aortic arch. Satisfactory opacification of the pulmonary arteries to the segmental level. While no intraluminal filling defects are identified there is suspected occlusion of upper lobe branches of the right pulmonary artery as a result of a large paramediastinal mass (see below). Normal heart size. No pericardial effusion. Marked severity coronary artery calcification is seen. Mediastinum/Nodes: A stable approximately 10.6 cm x 4.2 cm right-sided paramediastinal soft tissue mass is seen. This contains small areas of calcification and is unchanged in size and appearance when compared to the prior study. Lungs/Pleura: Mild stable areas of patchy scarring and/or atelectasis are seen within the left upper lobe and right lower lobe. Additional new mild areas of atelectasis and/or infiltrate are seen within the left upper lobe and right lower lobe. A 2.7 cm x 1.7 cm area of patchy consolidation is seen along the posteromedial aspect of the right lower lobe (axial CT image 79, CT series number  11). This is increased in size when compared to the prior study. There is a small right pleural effusion. Upper Abdomen: No acute abnormality. Musculoskeletal: Multilevel degenerative changes seen throughout the thoracic spine. Review of the MIP images confirms the above findings. IMPRESSION: 1. 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 2. Mild, stable areas of patchy left upper lobe and left lower lobe scarring and/or atelectasis with new areas of left upper lobe and right lower lobe atelectasis and/or infiltrate noted. 3. Patchy area of right lower lobe consolidation which is increased in size when compared to the prior study. While this may represent worsening infiltrate, sequelae associated with an underlying neoplastic process cannot be excluded. 4. No visible areas of pulmonary emboli. 5. Small right pleural effusion. Electronically Signed   By: Virgina Norfolk M.D.   On: 06/23/2019 22:29   DG Chest Portable 1 View  Result Date: 06/23/2019 CLINICAL DATA:  Shortness of breath. EXAM: PORTABLE CHEST 1 VIEW COMPARISON:  April 14, 2019 FINDINGS: There is stable right-sided venous Port-A-Cath positioning. The lungs are hyperinflated. A stable paramediastinal opacity is again seen extending from the suprahilar region on the right. Very mild atelectasis and/or early infiltrate is seen within the left lung base. The heart size and mediastinal contours are within normal limits. Degenerative changes seen throughout the thoracic spine. IMPRESSION: 1. Very mild left basilar atelectasis and/or early infiltrate. 2. Stable findings consistent with the patient's known right-sided paramediastinal mass. Electronically Signed   By: Virgina Norfolk M.D.   On: 06/23/2019 20:27    Procedures Procedures (including critical care time)  Medications Ordered in ED Medications  sodium chloride  (PF) 0.9 % injection (  Given by Other 06/23/19 2204)  iohexol (OMNIPAQUE) 350 MG/ML injection 100 mL (100 mLs Intravenous Contrast Given 06/23/19 2153)    ED Course  I have reviewed the triage vital signs and the nursing notes.  Pertinent labs & imaging results that were available during my care of the patient were reviewed by me and considered in my medical decision making (see chart for details).    MDM Rules/Calculators/A&P                      Patient presented with shortness of breath.  Began acutely at home.  EKG now showed a wider QRS than before.  Troponin is elevated.  She has however had no chest pain.  States she has had some shortness of breath  couple days ago but still no pain with it.  She is on treatment for stage IV lung cancer.  CTA scan done although she is already anticoagulated.  No PE, although does have occlusion of some of the superior pulmonary arteries.  This could be a PE equivalent, although she is now off all the oxygen and still satting well.  Has infiltrate right lower lobes.  Has had on and off including on recent PET scan.  Has a cough without fevers.  However with the elevated troponin and sats that were down in the 60s will admit to the hospitalist for further evaluation.  Patient also was Covid positive around 3 weeks ago. Final Clinical Impression(s) / ED Diagnoses Final diagnoses:  Hypoxia  Elevated troponin    Rx / DC Orders ED Discharge Orders    None       Davonna Belling, MD 06/23/19 2254

## 2019-06-23 NOTE — ED Notes (Signed)
Date and time results received: 06/23/19 8:26 PM  Test: Troponin Critical Value: 834  Name of Provider Notified: Alvino Chapel, MD  Orders Received? Or Actions Taken?:

## 2019-06-23 NOTE — ED Triage Notes (Signed)
Patient arriving by EMS from home, resp distress approx 15 min PTA. Hx of lung cancer. O2 sat 66%, EMS placed patient on CPAP en route. Patient placed on NR upon arrival. COVID-19 positive 05/2019. Chronic cough.   P 70 RR 16 BP 140/100 T 98 NSR

## 2019-06-23 NOTE — H&P (Signed)
History and Physical    [name] HWE:[id number] DOB: [birth year] DOA: 06/23/2019  PCP: [name], MD  Patient coming from: Home  I have personally briefly reviewed patient's old medical records in [place]  Chief Complaint: SOB  HPI: [name] is a 86 y.o. female with medical history significant of A.Fib on Eliquis, B lung CA s/p radiation and chemo.  Looks like she last got treated with Opdivo (immune therapy) 05/26/2019.  Patient tested positive for COVID-19 on 06/03/2019.  She has been essentially asymptomatic from a COVID standpoint since testing.  No fever, no increased SOB until today.  Has chronic cough thanks to lung cancer which is unchanged, also has chronic loss of taste and smell thanks to CA / CA treatment which is unchanged and pre-existing the COVID diagnosis.  Today she had sudden onset respiratory distress at home.  EMS called.  O2 sats reportedly in the 60s initially, put on CPAP then NRB.   ED Course: Symptoms lasted very short period of time and are now resolved, she is now satting 100% on RA and says she feels much better.  CTA chest neg for PE, does show infiltrates that seem to be related to lung CA.  No fever, no SIRS.  EKG shows new LBBB that wasn't present in Nov 2020.  Initial trop is 800, repeat 2900!  (Trop was 39 back in Oct 2020).  Patient denies any chest pain.   Review of Systems: As per HPI, otherwise all review of systems negative.  Past Medical History:  Diagnosis Date  . Atrial fibrillation ([place])   . FHx: chemotherapy 2008&2011   4 cycles cisplatin,taxotere,2008& carboplatin and Alimta 2011  . History of migraine headaches   . Hypercholesterolemia   . lung ca dx'd 06/2006   rt and lt lung  . Lung cancer ([place]) 3/14/1   right- Adenocarcinoma w/bronchioalveolar features  . Radiation 11/01/15-11/14/15   right upper lobe 30 gray  . Radiation pneumonitis ([place]) 01/24/2016    Past Surgical History:  Procedure  Laterality Date  . CATARACT EXTRACTION  2013   bilateral  . EMBOLECTOMY Right 03/13/2019   Procedure: RIGHT BRACHIAL, RADIAL, ULNAR EMBOLECTOMY;  Surgeon: Elam Dutch, MD;  Location: [facility] OR;  Service: Vascular;  Laterality: Right;  . left  lowerlung lobectomy Left 06/19/2006   Dr.Burney,Left lower lobectomy  . MYRINGOTOMY Bilateral 06/25/2016  . Porta-cath  2011     reports that she has never smoked. She has never used smokeless tobacco. She reports that she does not drink alcohol or use drugs.  Allergies  Allergen Reactions  . Simvastatin Other (See Comments)    Cluster migraines    Family History  Problem Relation Age of Onset  . Hypertension Father      Prior to Admission medications   Medication Sig Start Date End Date Taking? Authorizing Provider  amiodarone (PACERONE) 200 MG tablet Take 1 tablet (200 mg total) by mouth daily. 06/14/19  Yes Lorretta Harp, MD  apixaban (ELIQUIS) 2.5 MG TABS tablet Take 1 tablet (2.5 mg total) by mouth 2 (two) times daily. 03/16/19  Yes Ulyses Amor, [name]  chlorpheniramine-HYDROcodone (TUSSIONEX) 10-8 MG/5ML SUER Take 5 mLs by mouth 2 (two) times daily as needed for cough. 06/11/19  Yes Heilingoetter, Cassandra L, [name]  dextromethorphan (DELSYM) 30 MG/5ML liquid Take 15 mg by mouth as needed for cough.   Yes [provider]  metoprolol succinate (TOPROL-XL) 50 MG 24 hr tablet Take 1 tablet (50 mg  total) by mouth daily. Take with or immediately following a meal.  Refills per cardiology or primary care 03/17/19  Yes Ulyses Amor, [name]  Multiple Vitamin (MULTIVITAMIN WITH MINERALS) TABS tablet Take 1 tablet by mouth daily.   Yes [provider]  vitamin C (ASCORBIC ACID) 500 MG tablet Take 500 mg by mouth daily.   Yes [provider]    Physical Exam: Vitals:   06/23/19 2130 06/23/19 2203 06/23/19 2230 06/23/19 2300  BP: 121/81 120/87 123/78 129/83  Pulse: 65 68 66 67  Resp: (!) 25 20 (!) 24 20  Temp:       TempSrc:      SpO2: 96% 96% 96% 96%    Constitutional: NAD, calm, comfortable Eyes: PERRL, lids and conjunctivae normal ENMT: Mucous membranes are moist. Posterior pharynx clear of any exudate or lesions.Normal dentition.  Neck: normal, supple, no masses, no thyromegaly Respiratory: clear to auscultation bilaterally, no wheezing, no crackles. Normal respiratory effort. No accessory muscle use.  Cardiovascular: Regular rate and rhythm, no murmurs / rubs / gallops. No extremity edema. 2+ pedal pulses. No carotid bruits.  Abdomen: no tenderness, no masses palpated. No hepatosplenomegaly. Bowel sounds positive.  Musculoskeletal: no clubbing / cyanosis. No joint deformity upper and lower extremities. Good ROM, no contractures. Normal muscle tone.  Skin: no rashes, lesions, ulcers. No induration Neurologic: CN 2-12 grossly intact. Sensation intact, DTR normal. Strength 5/5 in all 4.  Psychiatric: Normal judgment and insight. Alert and oriented x 3. Normal mood.    Labs on Admission: I have personally reviewed following labs and imaging studies  CBC: Recent Labs  Lab 06/23/19 1928  WBC 9.0  NEUTROABS 6.1  HGB 14.1  HCT 44.9  MCV 98.5  PLT 573   Basic Metabolic Panel: Recent Labs  Lab 06/23/19 1928  NA 134*  K 4.1  CL 99  CO2 26  GLUCOSE 192*  BUN 20  CREATININE 0.81  CALCIUM 8.7*   GFR: CrCl cannot be calculated (Unknown ideal weight.). Liver Function Tests: Recent Labs  Lab 06/23/19 1928  AST 45*  ALT 38  ALKPHOS 132*  BILITOT 0.7  PROT 7.6  ALBUMIN 3.1*   No results for input(s): LIPASE, AMYLASE in the last 168 hours. No results for input(s): AMMONIA in the last 168 hours. Coagulation Profile: No results for input(s): INR, PROTIME in the last 168 hours. Cardiac Enzymes: No results for input(s): CKTOTAL, CKMB, CKMBINDEX, TROPONINI in the last 168 hours. BNP (last 3 results) No results for input(s): PROBNP in the last 8760 hours. HbA1C: No results for  input(s): HGBA1C in the last 72 hours. CBG: No results for input(s): GLUCAP in the last 168 hours. Lipid Profile: No results for input(s): CHOL, HDL, LDLCALC, TRIG, CHOLHDL, LDLDIRECT in the last 72 hours. Thyroid Function Tests: No results for input(s): TSH, T4TOTAL, FREET4, T3FREE, THYROIDAB in the last 72 hours. Anemia Panel: No results for input(s): VITAMINB12, FOLATE, FERRITIN, TIBC, IRON, RETICCTPCT in the last 72 hours. Urine analysis:    Component Value Date/Time   LABSPEC 1.030 03/17/2012 1051   PHURINE 5.0 03/17/2012 1051   HGBUR Negative 03/17/2012 1051   BILIRUBINUR Negative 03/17/2012 1051   KETONESUR Negative 03/17/2012 1051   PROTEINUR Negative 03/17/2012 1051   NITRITE Negative 03/17/2012 1051   LEUKOCYTESUR Negative 03/17/2012 1051    Radiological Exams on Admission: CT Angio Chest PE W and/or Wo Contrast  Result Date: 06/23/2019 CLINICAL DATA:  Respiratory distress. EXAM: CT ANGIOGRAPHY CHEST WITH CONTRAST TECHNIQUE: Multidetector CT  imaging of the chest was performed using the standard protocol during bolus administration of intravenous contrast. Multiplanar CT image reconstructions and MIPs were obtained to evaluate the vascular anatomy. CONTRAST:  166mL OMNIPAQUE IOHEXOL 350 MG/ML SOLN COMPARISON:  May 24, 2019 FINDINGS: Cardiovascular: A right-sided venous Port-A-Cath is in place. There is moderate severity calcification of the aortic arch. Satisfactory opacification of the pulmonary arteries to the segmental level. While no intraluminal filling defects are identified there is suspected occlusion of upper lobe branches of the right pulmonary artery as a result of a large paramediastinal mass (see below). Normal heart size. No pericardial effusion. Marked severity coronary artery calcification is seen. Mediastinum/Nodes: A stable approximately 10.6 cm x 4.2 cm right-sided paramediastinal soft tissue mass is seen. This contains small areas of calcification and is  unchanged in size and appearance when compared to the prior study. Lungs/Pleura: Mild stable areas of patchy scarring and/or atelectasis are seen within the left upper lobe and right lower lobe. Additional new mild areas of atelectasis and/or infiltrate are seen within the left upper lobe and right lower lobe. A 2.7 cm x 1.7 cm area of patchy consolidation is seen along the posteromedial aspect of the right lower lobe (axial CT image 79, CT series number 11). This is increased in size when compared to the prior study. There is a small right pleural effusion. Upper Abdomen: No acute abnormality. Musculoskeletal: Multilevel degenerative changes seen throughout the thoracic spine. Review of the MIP images confirms the above findings. IMPRESSION: 1. 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 2. Mild, stable areas of patchy left upper lobe and left lower lobe scarring and/or atelectasis with new areas of left upper lobe and right lower lobe atelectasis and/or infiltrate noted. 3. Patchy area of right lower lobe consolidation which is increased in size when compared to the prior study. While this may represent worsening infiltrate, sequelae associated with an underlying neoplastic process cannot be excluded. 4. No visible areas of pulmonary emboli. 5. Small right pleural effusion. Electronically Signed   By: Virgina Norfolk M.D.   On: 06/23/2019 22:29   DG Chest Portable 1 View  Result Date: 06/23/2019 CLINICAL DATA:  Shortness of breath. EXAM: PORTABLE CHEST 1 VIEW COMPARISON:  April 14, 2019 FINDINGS: There is stable right-sided venous Port-A-Cath positioning. The lungs are hyperinflated. A stable paramediastinal opacity is again seen extending from the suprahilar region on the right. Very mild atelectasis and/or early infiltrate is seen within the left lung base. The heart size and  mediastinal contours are within normal limits. Degenerative changes seen throughout the thoracic spine. IMPRESSION: 1. Very mild left basilar atelectasis and/or early infiltrate. 2. Stable findings consistent with the patient's known right-sided paramediastinal mass. Electronically Signed   By: Virgina Norfolk M.D.   On: 06/23/2019 20:27    EKG: Independently reviewed.  Assessment/Plan Principal Problem:   ACS (acute coronary syndrome) ([place]) Active Problems:   Bilateral lung cancer ([place])   COVID-19 virus detected    1. ACS - Unstable angina equivalent? 1. New LBBB, rapid and significant Trop rise, concerning for ACS. 2. CTA neg for PE 3. ACS pathway 4. Tele monitor 5. ASA 6. Continue home metoprolol 7. No heparin as she is on eliquis, will hold off on ordering eliquis for the moment and let cardiology decide if they want to switch to heparin or what they want to do specifically 8. Cards consulted and  will see patient on arrival [facility] to [facility] 9. NPO 2. COVID-19 1. Never seen COVID cause sudden onset SOB like this 20 days after diagnosis, nor associated with sudden rapid rise in trop like this, nor sudden resolution of symptoms. 2. No fever, no persistent O2 requirement 3. Does have infiltrates though these seem to be chronic, and associated with her known lung cancer and the large R mediastinal mass 4. Spoke with infection prevention: 1. At this point, patient is 21 days out from diagnosis without obvious COVID symptoms 2. Does not need isolation at this point per our protocol. 3. B lung CA - 1. Long term survivor, she has been battling lung CA since 2008 (13 years now), disease recurrence after surgery in 2011. 2. Mostly stable on CT, just the 1 area of worsening infiltrate in RLL. 3. Looks like next Opdivo should be in a couple of days (on Q4 week treatments). 4. Continue cough suppression  DVT prophylaxis: Either heparin gtt or continue eliquis - defer to cards Code Status:  Full Family Communication: Daughter at bedside Disposition Plan: Home after admit Consults called: Cardiology Admission status: Admit to inpatient  Severity of Illness: The appropriate patient status for this patient is INPATIENT. Inpatient status is judged to be reasonable and necessary in order to provide the required intensity of service to ensure the patient's safety. The patient's presenting symptoms, physical exam findings, and initial radiographic and laboratory data in the context of their chronic comorbidities is felt to place them at high risk for further clinical deterioration. Furthermore, it is not anticipated that the patient will be medically stable for discharge from the hospital within 2 midnights of admission. The following factors support the patient status of inpatient.   IP status due to acute cardiac event: ACS vs Unstable angina.  * I certify that at the point of admission it is my clinical judgment that the patient will require inpatient hospital care spanning beyond 2 midnights from the point of admission due to high intensity of service, high risk for further deterioration and high frequency of surveillance required.*    Tonie Elsey M. DO [facility]  How to contact the [facility] Attending or Consulting provider [place] or covering provider during after hours [place], for this patient?  1. Check the care team in [facility] and look for a) attending/consulting [facility] provider listed and b) the [facility] team listed 2. Log into [email or web address]  Amion Physician Scheduling and messaging for groups and whole hospitals  On call and physician scheduling software for group practices, residents, hospitalists and other medical providers for call, clinic, rotation and shift schedules. OnCall [employer] is a hospital-wide system for scheduling doctors and paging doctors on call. EasyPlot is for scientific plotting and data analysis.  [email or web address]  and use [place]'s universal password to  access. If you do not have the password, please contact the hospital operator.  3. Locate the [facility] provider you are looking for under [facility] and page to a number that you can be directly reached. 4. If you still have difficulty reaching the provider, please page the [facility] (Director on Call) for the Hospitalists listed on amion for assistance.  06/23/2019, 11:41 PM

## 2019-06-24 ENCOUNTER — Inpatient Hospital Stay: Payer: Medicare PPO

## 2019-06-24 ENCOUNTER — Inpatient Hospital Stay (HOSPITAL_COMMUNITY): Payer: Medicare PPO

## 2019-06-24 ENCOUNTER — Telehealth: Payer: Self-pay | Admitting: Medical Oncology

## 2019-06-24 ENCOUNTER — Inpatient Hospital Stay: Payer: Medicare PPO | Admitting: Internal Medicine

## 2019-06-24 DIAGNOSIS — R778 Other specified abnormalities of plasma proteins: Secondary | ICD-10-CM

## 2019-06-24 DIAGNOSIS — I340 Nonrheumatic mitral (valve) insufficiency: Secondary | ICD-10-CM

## 2019-06-24 DIAGNOSIS — I214 Non-ST elevation (NSTEMI) myocardial infarction: Secondary | ICD-10-CM

## 2019-06-24 DIAGNOSIS — I249 Acute ischemic heart disease, unspecified: Secondary | ICD-10-CM

## 2019-06-24 LAB — LIPID PANEL
Cholesterol: 183 mg/dL (ref 0–200)
HDL: 70 mg/dL (ref >40)
LDL Cholesterol: 104 mg/dL — ABNORMAL HIGH (ref 0–99)
Total CHOL/HDL Ratio: 2.6 RATIO
Triglycerides: 47 mg/dL (ref <150)
VLDL: 9 mg/dL (ref 0–40)

## 2019-06-24 LAB — COMPREHENSIVE METABOLIC PANEL
ALT: 34 U/L (ref 0–44)
AST: 57 U/L — ABNORMAL HIGH (ref 15–41)
Albumin: 2.8 g/dL — ABNORMAL LOW (ref 3.5–5.0)
Alkaline Phosphatase: 109 U/L (ref 38–126)
Anion gap: 11 (ref 5–15)
BUN: 16 mg/dL (ref 8–23)
CO2: 22 mmol/L (ref 22–32)
Calcium: 8.6 mg/dL — ABNORMAL LOW (ref 8.9–10.3)
Chloride: 102 mmol/L (ref 98–111)
Creatinine, Ser: 0.71 mg/dL (ref 0.44–1.00)
GFR calc Af Amer: >60 mL/min (ref >60)
GFR calc non Af Amer: >60 mL/min (ref >60)
Glucose, Bld: 134 mg/dL — ABNORMAL HIGH (ref 70–99)
Potassium: 4.6 mmol/L (ref 3.5–5.1)
Sodium: 135 mmol/L (ref 135–145)
Total Bilirubin: 0.9 mg/dL (ref 0.3–1.2)
Total Protein: 6.9 g/dL (ref 6.5–8.1)

## 2019-06-24 LAB — CBC WITH DIFFERENTIAL/PLATELET
Abs Immature Granulocytes: 0.01 10*3/uL (ref 0.00–0.07)
Basophils Absolute: 0 10*3/uL (ref 0.0–0.1)
Basophils Relative: 0 %
Eosinophils Absolute: 0 10*3/uL (ref 0.0–0.5)
Eosinophils Relative: 0 %
HCT: 41.4 % (ref 36.0–46.0)
Hemoglobin: 13.9 g/dL (ref 12.0–15.0)
Immature Granulocytes: 0 %
Lymphocytes Relative: 34 %
Lymphs Abs: 1.4 10*3/uL (ref 0.7–4.0)
MCH: 31.7 pg (ref 26.0–34.0)
MCHC: 33.6 g/dL (ref 30.0–36.0)
MCV: 94.5 fL (ref 80.0–100.0)
Monocytes Absolute: 0.1 10*3/uL (ref 0.1–1.0)
Monocytes Relative: 2 %
Neutro Abs: 2.5 10*3/uL (ref 1.7–7.7)
Neutrophils Relative %: 64 %
Platelets: 307 10*3/uL (ref 150–400)
RBC: 4.38 MIL/uL (ref 3.87–5.11)
RDW: 15.7 % — ABNORMAL HIGH (ref 11.5–15.5)
WBC: 3.9 10*3/uL — ABNORMAL LOW (ref 4.0–10.5)
nRBC: 0 % (ref 0.0–0.2)

## 2019-06-24 LAB — MRSA PCR SCREENING: MRSA by PCR: NEGATIVE

## 2019-06-24 LAB — ECHOCARDIOGRAM COMPLETE
Height: 62 in
Weight: 1629.64 oz

## 2019-06-24 LAB — HEPARIN LEVEL (UNFRACTIONATED): Heparin Unfractionated: 0.87 IU/mL — ABNORMAL HIGH (ref 0.30–0.70)

## 2019-06-24 LAB — MAGNESIUM: Magnesium: 2.1 mg/dL (ref 1.7–2.4)

## 2019-06-24 LAB — PHOSPHORUS: Phosphorus: 3.4 mg/dL (ref 2.5–4.6)

## 2019-06-24 LAB — APTT: aPTT: 59 seconds — ABNORMAL HIGH (ref 24–36)

## 2019-06-24 MED ORDER — SODIUM CHLORIDE 0.9 % WEIGHT BASED INFUSION
3.0000 mL/kg/h | INTRAVENOUS | Status: DC
  Administered 2019-06-25: 3 mL/kg/h via INTRAVENOUS

## 2019-06-24 MED ORDER — ASPIRIN 81 MG PO CHEW
81.0000 mg | CHEWABLE_TABLET | ORAL | Status: AC
  Administered 2019-06-25: 05:00:00 81 mg via ORAL
  Filled 2019-06-24: qty 1

## 2019-06-24 MED ORDER — SODIUM CHLORIDE 0.9 % IV SOLN: 250.0000 mL | INTRAVENOUS | Status: DC | PRN

## 2019-06-24 MED ORDER — ADULT MULTIVITAMIN W/MINERALS CH
1.0000 | ORAL_TABLET | Freq: Every day | ORAL | Status: DC
  Administered 2019-06-25 – 2019-06-26 (×2): 1 via ORAL
  Filled 2019-06-24 (×2): qty 1

## 2019-06-24 MED ORDER — SODIUM CHLORIDE 0.9% FLUSH
3.0000 mL | Freq: Two times a day (BID) | INTRAVENOUS | Status: DC
  Administered 2019-06-24 – 2019-06-25 (×2): 3 mL via INTRAVENOUS

## 2019-06-24 MED ORDER — ALBUTEROL SULFATE HFA 108 (90 BASE) MCG/ACT IN AERS
1.0000 | INHALATION_SPRAY | RESPIRATORY_TRACT | Status: DC
  Administered 2019-06-24: 20:00:00 1 via RESPIRATORY_TRACT
  Administered 2019-06-25 (×2): 2 via RESPIRATORY_TRACT
  Filled 2019-06-24: qty 6.7

## 2019-06-24 MED ORDER — HEPARIN (PORCINE) 25000 UT/250ML-% IV SOLN
750.0000 [IU]/h | INTRAVENOUS | Status: DC
  Administered 2019-06-24: 08:00:00 700 [IU]/h via INTRAVENOUS
  Filled 2019-06-24: qty 250

## 2019-06-24 MED ORDER — SODIUM CHLORIDE 0.9% FLUSH: 3.0000 mL | INTRAVENOUS | Status: DC | PRN

## 2019-06-24 MED ORDER — IPRATROPIUM-ALBUTEROL 20-100 MCG/ACT IN AERS
1.0000 | INHALATION_SPRAY | Freq: Four times a day (QID) | RESPIRATORY_TRACT | Status: DC
  Administered 2019-06-24 – 2019-06-25 (×2): 1 via RESPIRATORY_TRACT
  Filled 2019-06-24: qty 4

## 2019-06-24 MED ORDER — SODIUM CHLORIDE 0.9 % WEIGHT BASED INFUSION
1.0000 mL/kg/h | INTRAVENOUS | Status: DC
  Administered 2019-06-25: 06:00:00 1 mL/kg/h via INTRAVENOUS

## 2019-06-24 NOTE — Progress Notes (Signed)
ANTICOAGULATION CONSULT NOTE - Initial Consult  Pharmacy Consult for heparin Indication: ACS and Afib  Allergies  Allergen Reactions  . Simvastatin Other (See Comments)    Cluster migraines    Patient Measurements: Height: 5\' 2"  (157.5 cm) Weight: 101 lb 13.6 oz (46.2 kg) IBW/kg (Calculated) : 50.1  Vital Signs: Temp: 97.8 F (36.6 C) (01/21 0421) Temp Source: Oral (01/21 0421) BP: 113/71 (01/21 0421) Pulse Rate: 62 (01/21 0421)  Labs: Recent Labs    06/23/19 1928 06/23/19 2137  HGB 14.1  --   HCT 44.9  --   PLT 373  --   CREATININE 0.81  --   TROPONINIHS 834* 2,935*    Estimated Creatinine Clearance: 37.7 mL/min (by C-G formula based on SCr of 0.81 mg/dL).   Medical History: Past Medical History:  Diagnosis Date  . Atrial fibrillation ([place])   . FHx: chemotherapy 2008&2011   4 cycles cisplatin,taxotere,2008& carboplatin and Alimta 2011  . History of migraine headaches   . Hypercholesterolemia   . lung ca dx'd 06/2006   rt and lt lung  . Lung cancer ([place]) 3/14/1   right- Adenocarcinoma w/bronchioalveolar features  . Radiation 11/01/15-11/14/15   right upper lobe 30 gray  . Radiation pneumonitis ([place]) 01/24/2016    Medications:  Medications Prior to Admission  Medication Sig Dispense Refill Last Dose  . amiodarone (PACERONE) 200 MG tablet Take 1 tablet (200 mg total) by mouth daily. 30 tablet 0 06/23/2019 at Unknown time  . apixaban (ELIQUIS) 2.5 MG TABS tablet Take 1 tablet (2.5 mg total) by mouth 2 (two) times daily. 60 tablet 1 06/23/2019 at 0900  . chlorpheniramine-HYDROcodone (TUSSIONEX) 10-8 MG/5ML SUER Take 5 mLs by mouth 2 (two) times daily as needed for cough. 15 mL 0 Past Week at Unknown time  . dextromethorphan (DELSYM) 30 MG/5ML liquid Take 15 mg by mouth as needed for cough.   06/23/2019 at Unknown time  . metoprolol succinate (TOPROL-XL) 50 MG 24 hr tablet Take 1 tablet (50 mg total) by mouth daily. Take with or immediately following a meal.  Refills  per cardiology or primary care 30 tablet 1 06/23/2019 at 0900  . Multiple Vitamin (MULTIVITAMIN WITH MINERALS) TABS tablet Take 1 tablet by mouth daily.   06/23/2019 at Unknown time  . vitamin C (ASCORBIC ACID) 500 MG tablet Take 500 mg by mouth daily.   06/23/2019 at Unknown time   Scheduled:  . amiodarone  200 mg Oral Daily  . aspirin EC  81 mg Oral Daily  . metoprolol succinate  50 mg Oral Daily    Assessment: 86yo female c/o respiratory distress, O2 66% en route, troponin found to be 834 >> increased to 2935, admitted for further w/u, to hold Eliquis (last dose 1/20 9a) and start heparin.  Goal of Therapy:  Heparin level 0.3-0.7 units/ml aPTT 66-102 seconds Monitor platelets by anticoagulation protocol: Yes   Plan:  Will start heparin gtt at 700 units/hr and monitor heparin levels, aPTT (while Eliquis affects anti-Xa), and CBC.  Wynona Neat, PharmD, BCPS  06/24/2019,6:41 AM

## 2019-06-24 NOTE — ED Notes (Signed)
[facility] here for transport, family member was reminded of visitor rules and that she needs to [facility] tomorrow

## 2019-06-24 NOTE — Progress Notes (Signed)
  Echocardiogram 2D Echocardiogram has been performed.  Stefani Baik A Jayde Daffin 06/24/2019, 4:21 PM

## 2019-06-24 NOTE — Progress Notes (Signed)
PROGRESS NOTE    DLYNN RANES  NIO:[id number] DOB: 1932-07-05 DOA: 06/23/2019 PCP: [name], MD   Brief Narrative:  HPI per Jennette Kettle on 06/23/2019 HPI: ZELMA SNEAD is a 86 y.o. female with medical history significant of A.Fib on Eliquis, B lung CA s/p radiation and chemo.  Looks like she last got treated with Opdivo (immune therapy) 05/26/2019.  Patient tested positive for COVID-19 on 06/03/2019.  She has been essentially asymptomatic from a COVID standpoint since testing.  No fever, no increased SOB until today.  Has chronic cough thanks to lung cancer which is unchanged, also has chronic loss of taste and smell thanks to CA / CA treatment which is unchanged and pre-existing the COVID diagnosis.  Today she had sudden onset respiratory distress at home.  EMS called.  O2 sats reportedly in the 60s initially, put on CPAP then NRB.   ED Course: Symptoms lasted very short period of time and are now resolved, she is now satting 100% on RA and says she feels much better.  CTA chest neg for PE, does show infiltrates that seem to be related to lung CA.  No fever, no SIRS.  EKG shows new LBBB that wasn't present in Nov 2020.  Initial trop is 800, repeat 2900!  (Trop was 39 back in Oct 2020).  Patient denies any chest pain.   Review of Systems: As per HPI, otherwise all review of systems negative.  **Interim History  Patient's dyspnea is improved and cardiology evaluated and recommending a left heart catheterization due to her NSTEMI.  Cardiology does not feel that she is a CABG candidate feel that she may benefit from PCI and recommending repeating an echocardiogram.  Because she had Eliquis last night they are planning cardiac catheterization in a.m.  Assessment & Plan:   Principal Problem:   ACS (acute coronary syndrome) (HCC) Active Problems:   Bilateral lung cancer ([facility])   COVID-19 virus detected   Elevated troponin   Acute NSTEMI -New LBBB, rapid  and significant Trop rise, concerning for ACS. Had an anginal Equivalent with Exertional Dyspnea  -Troponin Level went from 834 -> 2935 -CTA neg for PE as showed no visibile areas of the Pulmonary Emboli; TA did show a large predominantly stable partially calcified right.  Mediastinal soft tissue mass with subsequent occlusion of the upper lobe branches of the right pulmonary artery and mild stable areas of patchy left upper lobe and left lower lobe scarring and/or atelectasis with new areas of upper left upper lobe and right lower lobe atelectasis and/or infiltrate noted.  Patchy areas of right lower lobe consolidation which is increased in size compared to prior study and there is also small right pleural effusion -ACS pathway initiated by Dr. Jennette Kettle -C/w Telemetry Monitoring  -Continue home Metoprolol Succinate 50 mg po Daily  -Heparin gtt initiated and will continue  -C/w ASA and Statin  -EKG showed NSR  -Repeat ECHO this visit (last was October 2020) -Cardiology Consulted and recommending Left Heart Cath with possible PCI but feel as if she is not a CABG Candidate   COVID-19 -Diagnosed 06/03/2019  -CTA neg for PE as showed no visibile areas of the Pulmonary Emboli; TA did show a large predominantly stable partially calcified right.  Mediastinal soft tissue mass with subsequent occlusion of the upper lobe branches of the right pulmonary artery and mild stable areas of patchy left upper lobe and left lower lobe scarring and/or atelectasis with new areas of upper left upper  lobe and right lower lobe atelectasis and/or infiltrate noted.  Patchy areas of right lower lobe consolidation which is increased in size compared to prior study and there is also small right pleural effusion -No fever, no persistent O2 requirement -Does have infiltrates though these seem to be chronic, and associated with her known lung cancer and the large R mediastinal mass -Dr. Gwendolyn Lima spoke with infection prevention  and since she is >21 days out from diagnosis without Fevers and Dyspnea has improved so she can be off of Isolation  -Will not check Inflammatory Markers currently given no real symptoms  -Saturating 98% on Room Air   B lung C Stage 4 with Lobectomy/Radiation and Chemo -Chronic Dyspnea  -Long term survivor, she has been battling lung CA since 2008 (13 years now), disease recurrence after surgery in 2011. -Mostly stable on CT, just the 1 area of worsening infiltrate in RLL. -Looks like next Opdivo should be in a couple of days (on Q4 week treatments) -Continue cough suppression -Sees Mohamad Mohamad in the outpatient setting  -Recently had PET Scan which showed "7.6 cm medial right upper lobe/perihilar mass with associated hypermetabolism, suspicious for residual/recurrent tumor. Additional multifocal hypermetabolic patchy/nodular opacities in the lungs bilaterally. This appearance has been waxing/waning on priors, and therefore may reflect infection/inflammation, although metastases are not excluded in the setting of treatment. No evidence of metastatic disease in the abdomen/pelvis." -Will need Close Follow up after Discharge   Chronic Systolic CHF -Currently appears EuVolemeic -Strict I's and O's and Daily Weights -C/w Metoprolol Succinate 50 mg po Daily  -On Heparin gtt for ACS as above -Repeating ECHO this visit   Paroxysmal Atrial Fibrillation/Flutter -CHA2DS2-VASc of 3 -On Eliquis normally but now on Heparin gtt -C/w Amiodarone 200 mg po Daily and Metoprolol Succinate 50 mg po Daily   HLD -Checked Lipid Panel and showed a total cholesterol/HDL ratio 2.6, cholesterol level 183, HDL 70, LDL 104, triglycerides of 47 -C/w Simvastatin   DVT prophylaxis: Anticoagulated with Heparin gtt Code Status: FULL CODE  Family Communication: No family present at bedside  Disposition Plan: Patient from Home with Exertional Dyspnea and Likely had an NSTEMI and will require LHC in the AM and  Cardiac Clearance prior to being D/C'd   Consultants:   Cardiology    Procedures:  ECHOCARDIOGRAM ordered and pending  Left Heart Cath in AM    Antimicrobials:  Anti-infectives (From admission, onward)   None     Subjective: Seen and examined at bedside and states that her shortness of breath is better.  No nausea or vomiting.  Has a slight cough but states this is chronic.  Denies any lightheadedness or dizziness.  Understands that she needs a left heart catheterization and this will be done tomorrow.  No other concerns or complaints at this time  Objective: Vitals:   06/24/19 0300 06/24/19 0330 06/24/19 0421 06/24/19 0735  BP: 132/85 139/83 113/71   Pulse: 62 64 62   Resp: (!) 28 20 (!) 24   Temp:   97.8 F (36.6 C) 97.7 F (36.5 C)  TempSrc:   Oral Oral  SpO2: 98% 98% 97%   Weight:   46.2 kg   Height:   5\' 2"  (1.575 m)    No intake or output data in the 24 hours ending 06/24/19 0747 Filed Weights   06/24/19 0421  Weight: 46.2 kg   Examination: Physical Exam:  Constitutional: Thin frail elderly Caucasian female currently in no acute distress appears mildly anxious  Eyes:  Lids and conjunctivae normal, sclerae anicteric  ENMT: External Ears, Nose appear normal. Grossly normal hearing.  Neck: Appears normal, supple, no cervical masses, normal ROM, no appreciable thyromegaly; no JVD Respiratory: Diminished to auscultation bilaterally, no wheezing, rales, rhonchi or crackles. Normal respiratory effort and patient is not tachypenic. No accessory muscle use. Unlabored breathing and not on any Supplemental O2 via [place] Cardiovascular: RRR, no murmurs / rubs / gallops. 2+ pedal pulses. No carotid bruits.  Abdomen: Soft, non-tender, non-distended. No masses palpated. No appreciable hepatosplenomegaly. Bowel sounds positive.  GU: Deferred. Musculoskeletal: No clubbing / cyanosis of digits/nails. No joint deformity upper and lower extremities. Good ROM, no contractures. Normal  strength and muscle tone.  Skin: No rashes, lesions, ulcers. No induration; Warm and dry.  Neurologic: CN 2-12 grossly intact with no focal deficits. Sensation intact in all 4 Extremities, DTR normal. Strength 5/5 in all 4. Romberg sign and cerebellar reflexes not assessed.  Psychiatric: Normal judgment and insight. Alert and oriented x 3. Slightly anxious mood and appropriate affect.   Data Reviewed: I have personally reviewed following labs and imaging studies  CBC: Recent Labs  Lab 06/23/19 1928  WBC 9.0  NEUTROABS 6.1  HGB 14.1  HCT 44.9  MCV 98.5  PLT 397   Basic Metabolic Panel: Recent Labs  Lab 06/23/19 1928  NA 134*  K 4.1  CL 99  CO2 26  GLUCOSE 192*  BUN 20  CREATININE 0.81  CALCIUM 8.7*   GFR: Estimated Creatinine Clearance: 37.7 mL/min (by C-G formula based on SCr of 0.81 mg/dL). Liver Function Tests: Recent Labs  Lab 06/23/19 1928  AST 45*  ALT 38  ALKPHOS 132*  BILITOT 0.7  PROT 7.6  ALBUMIN 3.1*   No results for input(s): LIPASE, AMYLASE in the last 168 hours. No results for input(s): AMMONIA in the last 168 hours. Coagulation Profile: No results for input(s): INR, PROTIME in the last 168 hours. Cardiac Enzymes: No results for input(s): CKTOTAL, CKMB, CKMBINDEX, TROPONINI in the last 168 hours. BNP (last 3 results) No results for input(s): PROBNP in the last 8760 hours. HbA1C: No results for input(s): HGBA1C in the last 72 hours. CBG: No results for input(s): GLUCAP in the last 168 hours. Lipid Profile: No results for input(s): CHOL, HDL, LDLCALC, TRIG, CHOLHDL, LDLDIRECT in the last 72 hours. Thyroid Function Tests: No results for input(s): TSH, T4TOTAL, FREET4, T3FREE, THYROIDAB in the last 72 hours. Anemia Panel: No results for input(s): VITAMINB12, FOLATE, FERRITIN, TIBC, IRON, RETICCTPCT in the last 72 hours. Sepsis Labs: No results for input(s): PROCALCITON, LATICACIDVEN in the last 168 hours.  Recent Results (from the past 240  hour(s))  MRSA PCR Screening     Status: None   Collection Time: 06/24/19  4:20 AM   Specimen: Nasopharyngeal  Result Value Ref Range Status   MRSA by PCR NEGATIVE NEGATIVE Final    Comment:        The GeneXpert MRSA Assay (FDA approved for NASAL specimens only), is one component of a comprehensive MRSA colonization surveillance program. It is not intended to diagnose MRSA infection nor to guide or monitor treatment for MRSA infections. Performed at [facility], [place] [address], [place],  [address]     Radiology Studies: CT Angio Chest PE W and/or Wo Contrast  Result Date: 06/23/2019 CLINICAL DATA:  Respiratory distress. EXAM: CT ANGIOGRAPHY CHEST WITH CONTRAST TECHNIQUE: Multidetector CT imaging of the chest was performed using the standard protocol during bolus administration of intravenous contrast.  Multiplanar CT image reconstructions and MIPs were obtained to evaluate the vascular anatomy. CONTRAST:  121mL OMNIPAQUE IOHEXOL 350 MG/ML SOLN COMPARISON:  May 24, 2019 FINDINGS: Cardiovascular: A right-sided venous Port-A-Cath is in place. There is moderate severity calcification of the aortic arch. Satisfactory opacification of the pulmonary arteries to the segmental level. While no intraluminal filling defects are identified there is suspected occlusion of upper lobe branches of the right pulmonary artery as a result of a large paramediastinal mass (see below). Normal heart size. No pericardial effusion. Marked severity coronary artery calcification is seen. Mediastinum/Nodes: A stable approximately 10.6 cm x 4.2 cm right-sided paramediastinal soft tissue mass is seen. This contains small areas of calcification and is unchanged in size and appearance when compared to the prior study. Lungs/Pleura: Mild stable areas of patchy scarring and/or atelectasis are seen within the left upper lobe and right lower lobe. Additional new mild areas of atelectasis and/or infiltrate are  seen within the left upper lobe and right lower lobe. A 2.7 cm x 1.7 cm area of patchy consolidation is seen along the posteromedial aspect of the right lower lobe (axial CT image 79, CT series number 11). This is increased in size when compared to the prior study. There is a small right pleural effusion. Upper Abdomen: No acute abnormality. Musculoskeletal: Multilevel degenerative changes seen throughout the thoracic spine. Review of the MIP images confirms the above findings. IMPRESSION: 1. 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 2. Mild, stable areas of patchy left upper lobe and left lower lobe scarring and/or atelectasis with new areas of left upper lobe and right lower lobe atelectasis and/or infiltrate noted. 3. Patchy area of right lower lobe consolidation which is increased in size when compared to the prior study. While this may represent worsening infiltrate, sequelae associated with an underlying neoplastic process cannot be excluded. 4. No visible areas of pulmonary emboli. 5. Small right pleural effusion. Electronically Signed   By: Virgina Norfolk M.D.   On: 06/23/2019 22:29   DG Chest Portable 1 View  Result Date: 06/23/2019 CLINICAL DATA:  Shortness of breath. EXAM: PORTABLE CHEST 1 VIEW COMPARISON:  April 14, 2019 FINDINGS: There is stable right-sided venous Port-A-Cath positioning. The lungs are hyperinflated. A stable paramediastinal opacity is again seen extending from the suprahilar region on the right. Very mild atelectasis and/or early infiltrate is seen within the left lung base. The heart size and mediastinal contours are within normal limits. Degenerative changes seen throughout the thoracic spine. IMPRESSION: 1. Very mild left basilar atelectasis and/or early infiltrate. 2. Stable findings consistent with the patient's known right-sided paramediastinal  mass. Electronically Signed   By: Virgina Norfolk M.D.   On: 06/23/2019 20:27   Scheduled Meds: . amiodarone  200 mg Oral Daily  . aspirin EC  81 mg Oral Daily  . metoprolol succinate  50 mg Oral Daily   Continuous Infusions: . heparin 700 Units/hr (06/24/19 0742)    LOS: 1 day   Kerney Elbe, DO [facility] PAGER is on [place]  If 7PM-7AM, please contact night-coverage [email or web address]

## 2019-06-24 NOTE — Plan of Care (Signed)
Initiating of Care Plan Problem: Education: Goal: Knowledge of General Education information will improve Description: Including pain rating scale, medication(s)/side effects and non-pharmacologic comfort measures Outcome: Progressing   Problem: Health Behavior/Discharge Planning: Goal: Ability to manage health-related needs will improve Outcome: Progressing   Problem: Clinical Measurements: Goal: Ability to maintain clinical measurements within normal limits will improve Outcome: Progressing Goal: Will remain free from infection Outcome: Progressing Goal: Diagnostic test results will improve Outcome: Progressing Goal: Respiratory complications will improve Outcome: Progressing Goal: Cardiovascular complication will be avoided Outcome: Progressing   Problem: Activity: Goal: Risk for activity intolerance will decrease Outcome: Progressing   Problem: Nutrition: Goal: Adequate nutrition will be maintained Outcome: Progressing   Problem: Coping: Goal: Level of anxiety will decrease Outcome: Progressing   Problem: Elimination: Goal: Will not experience complications related to bowel motility Outcome: Progressing Goal: Will not experience complications related to urinary retention Outcome: Progressing   Problem: Pain Managment: Goal: General experience of comfort will improve Outcome: Progressing   Problem: Safety: Goal: Ability to remain free from injury will improve Outcome: Progressing   Problem: Skin Integrity: Goal: Risk for impaired skin integrity will decrease Outcome: Progressing   Problem: Respiratory: Goal: Verbalizations of increased ease of respirations will increase Outcome: Progressing

## 2019-06-24 NOTE — Plan of Care (Signed)
  Problem: Clinical Measurements: Goal: Ability to maintain clinical measurements within normal limits will improve Outcome: Progressing Goal: Will remain free from infection Outcome: Progressing Goal: Respiratory complications will improve Outcome: Progressing   Problem: Activity: Goal: Risk for activity intolerance will decrease Outcome: Progressing   Problem: Nutrition: Goal: Adequate nutrition will be maintained Outcome: Progressing   Problem: Coping: Goal: Level of anxiety will decrease Outcome: Progressing

## 2019-06-24 NOTE — Progress Notes (Addendum)
Patient seen and examined by overnight cardiology fellow Dr. Paticia Stack. See note for full details.   [name] is a very pleasant 86 yo female with hx of recurrent NSCLC [id number]) followed by Dr. Julien Nordmann. She is on maintenance chemotherapy and is s/p She is active at baseline, lives alone, and completes her own cooking and cleaning. She lives near her daughter. She has been experiencing exertional dyspnea for the past week when doing chores at home, which resolves when she rests. Last evening, she experienced SOB when at rest, which was a change for her. She presented to ER via EMS. She denies chest pain.  HS troponin 834 --> 2935.  Continue heparin drip. She thinks she took her eliquis last evening before coming to the ER.   Sounds as though her anginal equivalent may be exertional dyspnea.  Will plan for left heart cath tomorrow. Echo pending.   She tested positive for COVID-19 on 06/02/19. COVID infection was 21 days prior, OK for cath lab.    [name] Bottcher, [name] 06/24/2019, 10:51 AM [place] [facility],  [address]   I have seen and examined the patient along with [name] Bottcher, [name].  I have reviewed the chart, notes and new data.  I agree with PA/NP's note.  Key new complaints: no problems with angina or dyspnea at rest right now. She lives independently and has good QOL, despite her cancer diagnosis (stage 4, but limited to lung, no progression on chemo for many years) Key examination changes: thin and frail appearing, but normal CV exam Key new findings / data: marked increase in hsTropI, broadened QRS (atypical IVCD with left axis deviation), but no specific ischemic changes. Severe coronary territory calcifications on CT chest.  PLAN: Appears to have suffered a small NSTEMI, preceded by weeks of crescendo symptoms, with dyspnea as her anginal equivalent. Recent COVID-19 infection further complicates  interpretation of her symptoms. I do not think that she is a CABG candidate, but it's possible she may benefit from percutaneous intervention. Echo may shed light on the extent of ischemic myocardium. Had Eliquis last evening, plan cath in AM. This procedure (diagnostic cath and possible PCI-stent) has been fully reviewed with the patient and written informed consent has been obtained.   Sanda Klein, MD, [place] [phone] 06/24/2019, 1:18 PM

## 2019-06-24 NOTE — Consult Note (Signed)
[facility] Consult Note   Primary Physician:  [name], MD Primary Cardiologist:   Kate Sable, MD  Reason for Consultation:  Elevated Troponin  HPI:    [name] is an 86 year-old female with a past medical history significant for recent COVID-19 infection (diagnosed on 06/03/2019), Stage 4 lung CA (s/p lobectomy, radiation and chemotherapy), systolic heart failure (EF 35-40%), atrial flutter/fibrillation (CHA2DS2-VASc Score 3, on chronic anticoagulation with Eliquis), previous episode of ischemic right hand on 03/13/2019 (requiring embolectomy), migraine headaches and hyperlipidemia who presented to the hospital with acute worsening of her shortness of breath. The patient suffers from chronic dyspnea due to her lung CA. She also was diagnosed with COVID-19 at the end of December 2020. She apparently had only mild symptoms due to this. She had sudden onset of shortness of breath at home yesterday evening. EMS reported that her O2 saturation was in the 60s. She was placed on a CPAP en route to the hospital. The patient denied any chest pain. She is on Amiodarone and Metoprolol for her history of atrial fibrillation.  In the ED her vital signs improved such that her O2 sat was a 100% on room air. The CXR was significant for mild left basilar atelectasis and/or early infiltrate. A CT angiogram was negative for a large PE. Her high sensitivity troponins were 834 and 2935. The ECG documented sinus rhythm at 69 bpm and a new left bundle branch block morphology (QRS 127 ms). Her past ECGs had a narrower QRS complex.    Previous cardiac studies TTE 03/13/19 IMPRESSIONS   1. Left ventricular ejection fraction, by visual estimation, is 35 to 40% with diffuse hypokinesis. The left ventricle has moderately decreased function. Normal left ventricular size. There is no left ventricular hypertrophy.  2. Elevated left atrial and left ventricular end-diastolic pressures.  3. Left  ventricular diastolic Doppler parameters are consistent with pseudonormalization pattern of LV diastolic filling.  4. Global right ventricle has normal systolic function.The right ventricular size is normal. No increase in right ventricular wall thickness.  5. Left atrial size was mildly dilated.  6. Right atrial size was normal.  7. The mitral valve is normal in structure. Mild mitral valve regurgitation. No evidence of mitral stenosis.  8. The tricuspid valve is normal in structure. Tricuspid valve regurgitation is mild.  9. The aortic valve is normal in structure. Aortic valve regurgitation was not visualized by color flow Doppler. Mild to moderate aortic valve sclerosis/calcification without any evidence of aortic stenosis. 10. The pulmonic valve was normal in structure. Pulmonic valve regurgitation is mild by color flow Doppler. 11. Normal pulmonary artery systolic pressure. 12. The inferior vena cava is normal in size with greater than 50% respiratory variability, suggesting right atrial pressure of 3 mmHg.    Home Medications Prior to Admission medications   Medication Sig Start Date End Date Taking? Authorizing Provider  amiodarone (PACERONE) 200 MG tablet Take 1 tablet (200 mg total) by mouth daily. 06/14/19  Yes Lorretta Harp, MD  apixaban (ELIQUIS) 2.5 MG TABS tablet Take 1 tablet (2.5 mg total) by mouth 2 (two) times daily. 03/16/19  Yes Ulyses Amor, [name]  chlorpheniramine-HYDROcodone (TUSSIONEX) 10-8 MG/5ML SUER Take 5 mLs by mouth 2 (two) times daily as needed for cough. 06/11/19  Yes Heilingoetter, Cassandra L, [name]  dextromethorphan (DELSYM) 30 MG/5ML liquid Take 15 mg by mouth as needed for cough.   Yes [provider]  metoprolol succinate (TOPROL-XL) 50 MG 24 hr  tablet Take 1 tablet (50 mg total) by mouth daily. Take with or immediately following a meal.  Refills per cardiology or primary care 03/17/19  Yes Ulyses Amor, [name]  Multiple Vitamin (MULTIVITAMIN  WITH MINERALS) TABS tablet Take 1 tablet by mouth daily.   Yes [provider]  vitamin C (ASCORBIC ACID) 500 MG tablet Take 500 mg by mouth daily.   Yes [provider]    Past Medical History: Past Medical History:  Diagnosis Date  . Atrial fibrillation ([place])   . FHx: chemotherapy 2008&2011   4 cycles cisplatin,taxotere,2008& carboplatin and Alimta 2011  . History of migraine headaches   . Hypercholesterolemia   . lung ca dx'd 06/2006   rt and lt lung  . Lung cancer ([place]) 3/14/1   right- Adenocarcinoma w/bronchioalveolar features  . Radiation 11/01/15-11/14/15   right upper lobe 30 gray  . Radiation pneumonitis ([place]) 01/24/2016    Past Surgical History: Past Surgical History:  Procedure Laterality Date  . CATARACT EXTRACTION  2013   bilateral  . EMBOLECTOMY Right 03/13/2019   Procedure: RIGHT BRACHIAL, RADIAL, ULNAR EMBOLECTOMY;  Surgeon: Elam Dutch, MD;  Location: [facility] OR;  Service: Vascular;  Laterality: Right;  . left  lowerlung lobectomy Left 06/19/2006   Dr.Burney,Left lower lobectomy  . MYRINGOTOMY Bilateral 06/25/2016  . Porta-cath  2011    Family History: Family History  Problem Relation Age of Onset  . Hypertension Father     Social History: Social History   Socioeconomic History  . Marital status: Widowed    Spouse name: Not on file  . Number of children: 2  . Years of education: Not on file  . Highest education level: Not on file  Occupational History  . Not on file  Tobacco Use  . Smoking status: Never Smoker  . Smokeless tobacco: Never Used  Substance and Sexual Activity  . Alcohol use: No  . Drug use: No  . Sexual activity: Not Currently  Other Topics Concern  . Not on file  Social History Narrative  . Not on file   Social Determinants of Health   Financial Resource Strain:   . Difficulty of Paying Living Expenses: Not on file  Food Insecurity:   . Worried About Charity fundraiser in the Last Year: Not on file  .  Ran Out of Food in the Last Year: Not on file  Transportation Needs:   . Lack of Transportation (Medical): Not on file  . Lack of Transportation (Non-Medical): Not on file  Physical Activity:   . Days of Exercise per Week: Not on file  . Minutes of Exercise per Session: Not on file  Stress:   . Feeling of Stress : Not on file  Social Connections:   . Frequency of Communication with Friends and Family: Not on file  . Frequency of Social Gatherings with Friends and Family: Not on file  . Attends Religious Services: Not on file  . Active Member of Clubs or Organizations: Not on file  . Attends Archivist Meetings: Not on file  . Marital Status: Not on file    Allergies:  Allergies  Allergen Reactions  . Simvastatin Other (See Comments)    Cluster migraines     Review of Systems: [y] = yes, [ ]  = no   . General: Weight gain [ ] ; Weight loss [ ] ; Anorexia [ ] ; Fatigue [Y]; Fever [ ] ; Chills [ ] ; Weakness [ ]   . Cardiac: Chest pain/pressure [ ] ;  Resting SOB [Y]; Exertional SOB [ ] ; Orthopnea [ ] ; Pedal Edema [ ] ; Palpitations [ ] ; Syncope [ ] ; Presyncope [ ] ; Paroxysmal nocturnal dyspnea[ ]   . Pulmonary: Cough [Y]; Wheezing[ ] ; Hemoptysis[ ] ; Sputum [ ] ; Snoring [ ]   . GI: Vomiting[ ] ; Dysphagia[ ] ; Melena[ ] ; Hematochezia [ ] ; Heartburn[ ] ; Abdominal pain [ ] ; Constipation [ ] ; Diarrhea [ ] ; BRBPR [ ]   . GU: Hematuria[ ] ; Dysuria [ ] ; Nocturia[ ]   . Vascular: Pain in legs with walking [ ] ; Pain in feet with lying flat [ ] ; Non-healing sores [ ] ; Stroke [ ] ; TIA [ ] ; Slurred speech [ ] ;  . Neuro: Headaches[ ] ; Vertigo[ ] ; Seizures[ ] ; Paresthesias[ ] ;Blurred vision [ ] ; Diplopia [ ] ; Vision changes [ ]   . Ortho/Skin: Arthritis [ ] ; Joint pain [ ] ; Muscle pain [ ] ; Joint swelling [ ] ; Back Pain [ ] ; Rash [ ]   . Psych: Depression[ ] ; Anxiety[ ]   . Heme: Bleeding problems [ ] ; Clotting disorders [ ] ; Anemia [ ]   . Endocrine: Diabetes [ ] ; Thyroid dysfunction[ ]       Objective:    Vital Signs:   Temp:  [97.5 F (36.4 C)-97.8 F (36.6 C)] 97.8 F (36.6 C) (01/21 0421) Pulse Rate:  [61-73] 62 (01/21 0421) Resp:  [16-33] 24 (01/21 0421) BP: (112-139)/(71-93) 113/71 (01/21 0421) SpO2:  [96 %-100 %] 97 % (01/21 0421) Weight:  [46.2 kg] 46.2 kg (01/21 0421)    Weight change: Filed Weights   06/24/19 0421  Weight: 46.2 kg    Intake/Output:  No intake or output data in the 24 hours ending 06/24/19 0502    Physical Exam    General:  Mildly uncomfortable, audible wheezing HEENT: normal Neck: supple. JVP . Carotids 2+ bilat; no bruits. No lymphadenopathy or thyromegaly appreciated. Cor: PMI nondisplaced. Regular rate & rhythm.  Lungs: scattered bilateral rhonchi Abdomen: soft, nontender, nondistended. No hepatosplenomegaly. No bruits or masses. Good bowel sounds. Extremities: no cyanosis, clubbing, rash, edema Neuro: alert & orientedx3, cranial nerves grossly intact. moves all 4 extremities w/o difficulty.  Affect pleasant    EKG    The ECG documented sinus rhythm at 69 bpm and a new left bundle branch block morphology (QRS 127 ms).  Labs   Basic Metabolic Panel: Recent Labs  Lab 06/23/19 1928  NA 134*  K 4.1  CL 99  CO2 26  GLUCOSE 192*  BUN 20  CREATININE 0.81  CALCIUM 8.7*    Liver Function Tests: Recent Labs  Lab 06/23/19 1928  AST 45*  ALT 38  ALKPHOS 132*  BILITOT 0.7  PROT 7.6  ALBUMIN 3.1*   No results for input(s): LIPASE, AMYLASE in the last 168 hours. No results for input(s): AMMONIA in the last 168 hours.  CBC: Recent Labs  Lab 06/23/19 1928  WBC 9.0  NEUTROABS 6.1  HGB 14.1  HCT 44.9  MCV 98.5  PLT 373    Cardiac Enzymes: No results for input(s): CKTOTAL, CKMB, CKMBINDEX, TROPONINI in the last 168 hours.  BNP: BNP (last 3 results) No results for input(s): BNP in the last 8760 hours.  ProBNP (last 3 results) No results for input(s): PROBNP in the last 8760 hours.   CBG: No  results for input(s): GLUCAP in the last 168 hours.  Coagulation Studies: No results for input(s): LABPROT, INR in the last 72 hours.   Imaging   CT Angio Chest PE W and/or Wo Contrast  Result Date: 06/23/2019 CLINICAL DATA:  Respiratory  distress. EXAM: CT ANGIOGRAPHY CHEST WITH CONTRAST TECHNIQUE: Multidetector CT imaging of the chest was performed using the standard protocol during bolus administration of intravenous contrast. Multiplanar CT image reconstructions and MIPs were obtained to evaluate the vascular anatomy. CONTRAST:  158mL OMNIPAQUE IOHEXOL 350 MG/ML SOLN COMPARISON:  May 24, 2019 FINDINGS: Cardiovascular: A right-sided venous Port-A-Cath is in place. There is moderate severity calcification of the aortic arch. Satisfactory opacification of the pulmonary arteries to the segmental level. While no intraluminal filling defects are identified there is suspected occlusion of upper lobe branches of the right pulmonary artery as a result of a large paramediastinal mass (see below). Normal heart size. No pericardial effusion. Marked severity coronary artery calcification is seen. Mediastinum/Nodes: A stable approximately 10.6 cm x 4.2 cm right-sided paramediastinal soft tissue mass is seen. This contains small areas of calcification and is unchanged in size and appearance when compared to the prior study. Lungs/Pleura: Mild stable areas of patchy scarring and/or atelectasis are seen within the left upper lobe and right lower lobe. Additional new mild areas of atelectasis and/or infiltrate are seen within the left upper lobe and right lower lobe. A 2.7 cm x 1.7 cm area of patchy consolidation is seen along the posteromedial aspect of the right lower lobe (axial CT image 79, CT series number 11). This is increased in size when compared to the prior study. There is a small right pleural effusion. Upper Abdomen: No acute abnormality. Musculoskeletal: Multilevel degenerative changes seen throughout  the thoracic spine. Review of the MIP images confirms the above findings. IMPRESSION: 1. 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 2. Mild, stable areas of patchy left upper lobe and left lower lobe scarring and/or atelectasis with new areas of left upper lobe and right lower lobe atelectasis and/or infiltrate noted. 3. Patchy area of right lower lobe consolidation which is increased in size when compared to the prior study. While this may represent worsening infiltrate, sequelae associated with an underlying neoplastic process cannot be excluded. 4. No visible areas of pulmonary emboli. 5. Small right pleural effusion. Electronically Signed   By: Virgina Norfolk M.D.   On: 06/23/2019 22:29   DG Chest Portable 1 View  Result Date: 06/23/2019 CLINICAL DATA:  Shortness of breath. EXAM: PORTABLE CHEST 1 VIEW COMPARISON:  April 14, 2019 FINDINGS: There is stable right-sided venous Port-A-Cath positioning. The lungs are hyperinflated. A stable paramediastinal opacity is again seen extending from the suprahilar region on the right. Very mild atelectasis and/or early infiltrate is seen within the left lung base. The heart size and mediastinal contours are within normal limits. Degenerative changes seen throughout the thoracic spine. IMPRESSION: 1. Very mild left basilar atelectasis and/or early infiltrate. 2. Stable findings consistent with the patient's known right-sided paramediastinal mass. Electronically Signed   By: Virgina Norfolk M.D.   On: 06/23/2019 20:27      Medications:     Current Medications: . amiodarone  200 mg Oral Daily  . aspirin EC  81 mg Oral Daily  . metoprolol succinate  50 mg Oral Daily       Assessment/Plan   1. Acute coronary syndrome / Elevated Troponin  -Serial ECGs -Continue to trend cardiac biomarkers -Continue ASA 81 mg daily -Agree  with IV unfractionated heparin (dose per pharmacy) -Hold Eliquis -Continue Amiodarone -Continue Metoprolol -The patient is listed to have an allergy to Simvastatin. Check a lipid panel -Consider repeat  echocardiogram (last one was in October 2020) -Nitroglycerin PRN if patient complains of chest pain  Will review results of echo and lab tests before deciding whether the patient needs any invasive testing such as a cardiac catheterization.   Meade Maw, MD  06/24/2019, 5:02 AM  Cardiology Overnight Team Please contact [facility] for night-coverage after hours (4p -7a ) and weekends on [email or web address]

## 2019-06-24 NOTE — ED Notes (Signed)
Patient denies chest pain at this time 

## 2019-06-24 NOTE — Progress Notes (Signed)
Initial Nutrition Assessment  RD working remotely.  DOCUMENTATION CODES:   Not applicable, suspect malnutrition but unable to confirm at this time  INTERVENTION:   - Once diet advanced, Ensure Enlive po TID, each supplement provides 350 kcal and 20 grams of protein (vanilla flavor)  - MVI with minerals daily  NUTRITION DIAGNOSIS:   Increased nutrient needs related to cancer and cancer related treatments as evidenced by estimated needs.  GOAL:   Patient will meet greater than or equal to 90% of their needs  MONITOR:   PO intake, Supplement acceptance, Diet advancement, Weight trends  REASON FOR ASSESSMENT:   Malnutrition Screening Tool    ASSESSMENT:   86 year old female who presented to the ED on 1/20 with SOB. PMH of stage IV lung cancer s/p lobectomy and radiation and chemo, atrial fibrillation.   Pt is NPO at this time. Cardiology work-up pending.  Spoke with pt via phone call to room. Pt reports that she is feeling much better today but that she can't eat because "they are going to do some tests this afternoon."  Pt reports that her appetite "isn't wonderful" but that she is eating 3 meals a day and drinking supplements. Pt states that she can't taste or smell anything due to chemo treatments.  Pt states that she either drinks 2 Carnation Instant Breakfast Essentials (vanilla) or 2 Boost supplements (vanilla) daily. Pt reports eating 3 meals. An example of a meal may be beans, rice, and chicken. Pt reports she drinks the "blue drink" (referring to Gatorade) in between meals.  Pt is willing to try vanilla Ensure Enlive once her diet is advanced.  Pt reports that her UBW is 120 lbs and that she originally started losing weight in 2008 when she was first diagnosed with lung cancer. Reviewed weight history in chart. Pt with progressive weight loss since August 2020. Overall, pt has lost 6.9 kg since that time. This is a 13% weight loss in less than 6 months which is  significant for timeframe.  Strongly suspect pt meets criteria for malnutrition but unable to confirm at this time without NFPE.  Medications reviewed and include: heparin  Labs reviewed.  NUTRITION - FOCUSED PHYSICAL EXAM:  Unable to complete at this time. RD working remotely.  Diet Order:   Diet Order            Diet NPO time specified  Diet effective midnight              EDUCATION NEEDS:   Education needs have been addressed  Skin:  Skin Assessment: Reviewed RN Assessment  Last BM:  06/23/19  Height:   Ht Readings from Last 1 Encounters:  06/24/19 5\' 2"  (1.575 m)    Weight:   Wt Readings from Last 1 Encounters:  06/24/19 46.2 kg    Ideal Body Weight:  50 kg  BMI:  Body mass index is 18.63 kg/m.  Estimated Nutritional Needs:   Kcal:  1400-1600  Protein:  70-85 grams  Fluid:  1.4-1.6 L    [name], MS, RD, LDN Inpatient Clinical Dietitian Pager: [phone] Weekend/After Hours: [phone]

## 2019-06-24 NOTE — Telephone Encounter (Signed)
Pt brought to [facility] hospital by EMS and having a cardiac w/u. Appts cancelled for today.

## 2019-06-24 NOTE — Progress Notes (Signed)
ANTICOAGULATION CONSULT NOTE - Initial Consult  Pharmacy Consult for heparin Indication: ACS and Afib  Patient Measurements: Height: 5\' 2"  (157.5 cm) Weight: 101 lb 13.6 oz (46.2 kg) IBW/kg (Calculated) : 50.1 Heparin weight: 46 kg  Vital Signs: Temp: 97.9 F (36.6 C) (01/21 1217) Temp Source: Oral (01/21 1217) BP: 110/68 (01/21 1217) Pulse Rate: 62 (01/21 1217)  Labs: Recent Labs    06/23/19 1928 06/23/19 2137 06/24/19 0757 06/24/19 1505  HGB 14.1  --  13.9  --   HCT 44.9  --  41.4  --   PLT 373  --  307  --   APTT  --   --   --  59*  HEPARINUNFRC  --   --   --  0.87*  CREATININE 0.81  --  0.71  --   TROPONINIHS 834* 2,935*  --   --     Estimated Creatinine Clearance: 38.2 mL/min (by C-G formula based on SCr of 0.71 mg/dL).   Assessment: 86 yo W with hx afib (CHADS2VASc = 4) on apixaban admitted for NSTEMI. Apixaban held, last dose 1/20 ~9 am per med hx tech but told MD last dose was 1/20 PM before admission. Started on heparin infusion. Will follow aPTT until correlates with HL.   APTT slightly below goal, no bolus was given, will likely continue to get to steady state. H/H, plt stable. No bleeding reported. Noted low body weight.  Goal of Therapy:  Heparin level 0.3-0.7 units/ml aPTT 66-102 seconds Monitor platelets by anticoagulation protocol: Yes   Plan:  Increase heparin gtt to 750 units/hr F/u 6hr aPTT Follow aPTT until correlates with HL Monitor daily aPTT, HL, CBC, plt Monitor for signs/symptoms of bleeding   Benetta Spar, PharmD, BCPS, BCCP Clinical Pharmacist  Please check AMION for all [place] phone numbers After 10:00 PM, call [place]

## 2019-06-24 NOTE — ED Notes (Signed)
[facility] called for transport. Paperwork at nurses station.

## 2019-06-25 ENCOUNTER — Other Ambulatory Visit: Payer: Self-pay | Admitting: Physician Assistant

## 2019-06-25 ENCOUNTER — Inpatient Hospital Stay (HOSPITAL_COMMUNITY): Payer: Medicare PPO

## 2019-06-25 ENCOUNTER — Ambulatory Visit: Payer: Medicare PPO | Admitting: Cardiovascular Disease

## 2019-06-25 ENCOUNTER — Encounter (HOSPITAL_COMMUNITY)
Admission: EM | Disposition: A | Discharge status: Home / Self Care | Payer: Self-pay | Source: Home / Self Care | Attending: Internal Medicine

## 2019-06-25 DIAGNOSIS — I2510 Atherosclerotic heart disease of native coronary artery without angina pectoris: Secondary | ICD-10-CM

## 2019-06-25 DIAGNOSIS — I5181 Takotsubo syndrome: Secondary | ICD-10-CM

## 2019-06-25 HISTORY — PX: LEFT HEART CATH AND CORONARY ANGIOGRAPHY: CATH118249

## 2019-06-25 LAB — COMPREHENSIVE METABOLIC PANEL
ALT: 33 U/L (ref 0–44)
AST: 49 U/L — ABNORMAL HIGH (ref 15–41)
Albumin: 2.8 g/dL — ABNORMAL LOW (ref 3.5–5.0)
Alkaline Phosphatase: 101 U/L (ref 38–126)
Anion gap: 10 (ref 5–15)
BUN: 21 mg/dL (ref 8–23)
CO2: 24 mmol/L (ref 22–32)
Calcium: 8.6 mg/dL — ABNORMAL LOW (ref 8.9–10.3)
Chloride: 100 mmol/L (ref 98–111)
Creatinine, Ser: 0.8 mg/dL (ref 0.44–1.00)
GFR calc Af Amer: >60 mL/min (ref >60)
GFR calc non Af Amer: >60 mL/min (ref >60)
Glucose, Bld: 121 mg/dL — ABNORMAL HIGH (ref 70–99)
Potassium: 4 mmol/L (ref 3.5–5.1)
Sodium: 134 mmol/L — ABNORMAL LOW (ref 135–145)
Total Bilirubin: 0.7 mg/dL (ref 0.3–1.2)
Total Protein: 6.7 g/dL (ref 6.5–8.1)

## 2019-06-25 LAB — HEPARIN LEVEL (UNFRACTIONATED): Heparin Unfractionated: 0.9 IU/mL — ABNORMAL HIGH (ref 0.30–0.70)

## 2019-06-25 LAB — CBC WITH DIFFERENTIAL/PLATELET
Abs Immature Granulocytes: 0.02 10*3/uL (ref 0.00–0.07)
Basophils Absolute: 0 10*3/uL (ref 0.0–0.1)
Basophils Relative: 0 %
Eosinophils Absolute: 0 10*3/uL (ref 0.0–0.5)
Eosinophils Relative: 0 %
HCT: 41.2 % (ref 36.0–46.0)
Hemoglobin: 13.9 g/dL (ref 12.0–15.0)
Immature Granulocytes: 0 %
Lymphocytes Relative: 25 %
Lymphs Abs: 1.9 10*3/uL (ref 0.7–4.0)
MCH: 31.7 pg (ref 26.0–34.0)
MCHC: 33.7 g/dL (ref 30.0–36.0)
MCV: 94.1 fL (ref 80.0–100.0)
Monocytes Absolute: 1 10*3/uL (ref 0.1–1.0)
Monocytes Relative: 13 %
Neutro Abs: 4.8 10*3/uL (ref 1.7–7.7)
Neutrophils Relative %: 62 %
Platelets: 323 10*3/uL (ref 150–400)
RBC: 4.38 MIL/uL (ref 3.87–5.11)
RDW: 15.9 % — ABNORMAL HIGH (ref 11.5–15.5)
WBC: 7.7 10*3/uL (ref 4.0–10.5)
nRBC: 0 % (ref 0.0–0.2)

## 2019-06-25 LAB — MAGNESIUM: Magnesium: 2.1 mg/dL (ref 1.7–2.4)

## 2019-06-25 LAB — PHOSPHORUS: Phosphorus: 3 mg/dL (ref 2.5–4.6)

## 2019-06-25 LAB — APTT
aPTT: 62 s — ABNORMAL HIGH (ref 24–36)
aPTT: 73 seconds — ABNORMAL HIGH (ref 24–36)

## 2019-06-25 SURGERY — LEFT HEART CATH AND CORONARY ANGIOGRAPHY
Anesthesia: LOCAL

## 2019-06-25 MED ORDER — ALBUTEROL SULFATE HFA 108 (90 BASE) MCG/ACT IN AERS
1.0000 | INHALATION_SPRAY | Freq: Four times a day (QID) | RESPIRATORY_TRACT | Status: DC | PRN
  Administered 2019-06-25: 18:00:00 2 via RESPIRATORY_TRACT

## 2019-06-25 MED ORDER — FENTANYL CITRATE (PF) 100 MCG/2ML IJ SOLN
INTRAMUSCULAR | Status: DC | PRN
  Administered 2019-06-25: 25 ug via INTRAVENOUS

## 2019-06-25 MED ORDER — FUROSEMIDE 20 MG PO TABS
20.0000 mg | ORAL_TABLET | Freq: Every day | ORAL | Status: DC
  Administered 2019-06-25 – 2019-06-26 (×2): 20 mg via ORAL
  Filled 2019-06-25 (×2): qty 1

## 2019-06-25 MED ORDER — SODIUM CHLORIDE 0.9% FLUSH
3.0000 mL | Freq: Two times a day (BID) | INTRAVENOUS | Status: DC
  Administered 2019-06-25 – 2019-06-26 (×2): 3 mL via INTRAVENOUS

## 2019-06-25 MED ORDER — SODIUM CHLORIDE 0.9 % WEIGHT BASED INFUSION: 1.0000 mL/kg/h | INTRAVENOUS | Status: AC

## 2019-06-25 MED ORDER — HEPARIN SODIUM (PORCINE) 1000 UNIT/ML IJ SOLN
INTRAMUSCULAR | Status: DC | PRN
  Administered 2019-06-25: 3000 [IU] via INTRAVENOUS

## 2019-06-25 MED ORDER — SODIUM CHLORIDE 0.9 % IV SOLN: 250.0000 mL | INTRAVENOUS | Status: DC | PRN

## 2019-06-25 MED ORDER — IPRATROPIUM-ALBUTEROL 20-100 MCG/ACT IN AERS
1.0000 | INHALATION_SPRAY | Freq: Two times a day (BID) | RESPIRATORY_TRACT | Status: DC
  Administered 2019-06-25 – 2019-06-26 (×2): 1 via RESPIRATORY_TRACT

## 2019-06-25 MED ORDER — VERAPAMIL HCL 2.5 MG/ML IV SOLN
INTRAVENOUS | Status: DC | PRN
  Administered 2019-06-25: 10 mL via INTRA_ARTERIAL

## 2019-06-25 MED ORDER — MIDAZOLAM HCL 2 MG/2ML IJ SOLN
INTRAMUSCULAR | Status: AC
  Filled 2019-06-25: qty 2

## 2019-06-25 MED ORDER — HEPARIN SODIUM (PORCINE) 1000 UNIT/ML IJ SOLN
INTRAMUSCULAR | Status: AC
  Filled 2019-06-25: qty 1

## 2019-06-25 MED ORDER — HEPARIN (PORCINE) IN NACL 1000-0.9 UT/500ML-% IV SOLN
INTRAVENOUS | Status: DC | PRN
  Administered 2019-06-25 (×2): 500 mL

## 2019-06-25 MED ORDER — LIDOCAINE HCL (PF) 1 % IJ SOLN
INTRAMUSCULAR | Status: DC | PRN
  Administered 2019-06-25: 2 mL

## 2019-06-25 MED ORDER — IOHEXOL 350 MG/ML SOLN
INTRAVENOUS | Status: DC | PRN
  Administered 2019-06-25: 20 mL

## 2019-06-25 MED ORDER — DM-GUAIFENESIN ER 30-600 MG PO TB12
1.0000 | ORAL_TABLET | Freq: Two times a day (BID) | ORAL | Status: DC
  Administered 2019-06-25 – 2019-06-26 (×2): 1 via ORAL
  Filled 2019-06-25 (×2): qty 1

## 2019-06-25 MED ORDER — SODIUM CHLORIDE 0.9% FLUSH: 3.0000 mL | INTRAVENOUS | Status: DC | PRN

## 2019-06-25 MED ORDER — ENOXAPARIN SODIUM 40 MG/0.4ML ~~LOC~~ SOLN: 40.0000 mg | SUBCUTANEOUS | Status: DC

## 2019-06-25 MED ORDER — APIXABAN 2.5 MG PO TABS
2.5000 mg | ORAL_TABLET | Freq: Two times a day (BID) | ORAL | Status: DC
  Administered 2019-06-25 – 2019-06-26 (×2): 2.5 mg via ORAL
  Filled 2019-06-25 (×2): qty 1

## 2019-06-25 MED ORDER — HEPARIN (PORCINE) IN NACL 1000-0.9 UT/500ML-% IV SOLN
INTRAVENOUS | Status: AC
  Filled 2019-06-25: qty 1000

## 2019-06-25 MED ORDER — FENTANYL CITRATE (PF) 100 MCG/2ML IJ SOLN
INTRAMUSCULAR | Status: AC
  Filled 2019-06-25: qty 2

## 2019-06-25 MED ORDER — FUROSEMIDE 20 MG PO TABS: 20.0000 mg | ORAL_TABLET | Freq: Every day | ORAL | 1 refills | Status: DC

## 2019-06-25 MED ORDER — METHYLPREDNISOLONE SODIUM SUCC 40 MG IJ SOLR
40.0000 mg | Freq: Two times a day (BID) | INTRAMUSCULAR | Status: DC
  Administered 2019-06-25 – 2019-06-26 (×2): 40 mg via INTRAVENOUS
  Filled 2019-06-25 (×2): qty 1

## 2019-06-25 MED ORDER — LIDOCAINE HCL (PF) 1 % IJ SOLN
INTRAMUSCULAR | Status: AC
  Filled 2019-06-25: qty 30

## 2019-06-25 MED ORDER — MIDAZOLAM HCL 2 MG/2ML IJ SOLN
INTRAMUSCULAR | Status: DC | PRN
  Administered 2019-06-25: 1 mg via INTRAVENOUS

## 2019-06-25 SURGICAL SUPPLY — 10 items
CATH 5FR JL3.5 JR4 ANG PIG MP (CATHETERS) ×1 IMPLANT
DEVICE RAD TR BAND REGULAR (VASCULAR PRODUCTS) ×1 IMPLANT
GLIDESHEATH SLEND SS 6F .021 (SHEATH) ×1 IMPLANT
GUIDEWIRE INQWIRE 1.5J.035X260 (WIRE) IMPLANT
INQWIRE 1.5J .035X260CM (WIRE) ×2
KIT HEART LEFT (KITS) ×2 IMPLANT
PACK CARDIAC CATHETERIZATION (CUSTOM PROCEDURE TRAY) ×2 IMPLANT
TRANSDUCER W/STOPCOCK (MISCELLANEOUS) ×2 IMPLANT
TUBING CIL FLEX 10 FLL-RA (TUBING) ×2 IMPLANT
WIRE HI TORQ VERSACORE-J 145CM (WIRE) ×1 IMPLANT

## 2019-06-25 NOTE — Progress Notes (Signed)
assisted to the bathroom to void and started wheezing, ventolin 2 puff given with relief. Ambulated   Along the hallway for 5 min started to be short of breath. Assisted back to her room and sit on a chair with on and off  non-productive cough. Daughter at bedside . MD aware and claimed to cancell discharge today.

## 2019-06-25 NOTE — Progress Notes (Signed)
   06/24/19 2356  Vitals  Temp 97.6 F (36.4 C)  Temp Source Oral  BP 124/66  MAP (mmHg) 82  BP Location Right Arm  BP Method Automatic  Patient Position (if appropriate) Lying  Pulse Rate (!) 56  Pulse Rate Source Monitor  ECG Heart Rate (!) 56  Resp (!) 21  Oxygen Therapy  SpO2 100 %  O2 Device Room Air   Patient is getting a little confused. When asked, [place] Hospitalist notified.

## 2019-06-25 NOTE — Progress Notes (Signed)
TR band removed, 2x2 gauze and tegaderm applied. No bleeding noted. Continue to monitor.

## 2019-06-25 NOTE — Progress Notes (Addendum)
Progress Note  Patient Name: [name] Date of Encounter: 06/25/2019  Primary Cardiologist: Kate Sable, MD   Subjective   Feels better, no dyspnea walking to the bathroom. No angina.  Inpatient Medications    Scheduled Meds: . albuterol  1-2 puff Inhalation Q4H  . amiodarone  200 mg Oral Daily  . aspirin EC  81 mg Oral Daily  . Ipratropium-Albuterol  1 puff Inhalation Q6H  . metoprolol succinate  50 mg Oral Daily  . multivitamin with minerals  1 tablet Oral Daily  . sodium chloride flush  3 mL Intravenous Q12H   Continuous Infusions: . sodium chloride    . sodium chloride 1 mL/kg/hr (06/25/19 0620)  . heparin 750 Units/hr (06/24/19 1618)   PRN Meds: sodium chloride, acetaminophen, chlorpheniramine-HYDROcodone, dextromethorphan, nitroGLYCERIN, ondansetron (ZOFRAN) IV, sodium chloride flush   Vital Signs    Vitals:   06/24/19 1622 06/24/19 1934 06/24/19 2356 06/25/19 0321  BP: 120/71 114/68 124/66 99/82  Pulse: (!) 55 (!) 56 (!) 56 (!) 52  Resp: 20 15 (!) 21 16  Temp:  (!) 97.4 F (36.3 C) 97.6 F (36.4 C) 97.9 F (36.6 C)  TempSrc:  Oral Oral Oral  SpO2: 99% 98% 100% 97%  Weight:      Height:        Intake/Output Summary (Last 24 hours) at 06/25/2019 6381 Last data filed at 06/25/2019 0700 Gross per 24 hour  Intake 201.49 ml  Output --  Net 201.49 ml   Last 3 Weights 06/24/2019 05/26/2019 05/04/2019  Weight (lbs) 101 lb 13.6 oz 104 lb 3.2 oz 107 lb  Weight (kg) 46.2 kg 47.265 kg 48.535 kg      Telemetry    NSR - Personally Reviewed  ECG    No new tracing - Personally Reviewed  Physical Exam  Appears well GEN: No acute distress.   Neck: No JVD Cardiac: RRR, no murmurs, rubs, or gallops.  Respiratory: Clear to auscultation bilaterally. GI: Soft, nontender, non-distended  MS: No edema; No deformity. Neuro:  Nonfocal  Psych: Normal affect   Labs    High Sensitivity Troponin:   Recent Labs  Lab 06/23/19 1928 06/23/19 2137   TROPONINIHS 834* 2,935*      Chemistry Recent Labs  Lab 06/23/19 1928 06/24/19 0757 06/24/19 2345  NA 134* 135 134*  K 4.1 4.6 4.0  CL 99 102 100  CO2 26 22 24   GLUCOSE 192* 134* 121*  BUN 20 16 21   CREATININE 0.81 0.71 0.80  CALCIUM 8.7* 8.6* 8.6*  PROT 7.6 6.9 6.7  ALBUMIN 3.1* 2.8* 2.8*  AST 45* 57* 49*  ALT 38 34 33  ALKPHOS 132* 109 101  BILITOT 0.7 0.9 0.7  GFRNONAA >60 >60 >60  GFRAA >60 >60 >60  ANIONGAP 9 11 10      Hematology Recent Labs  Lab 06/23/19 1928 06/24/19 0757 06/24/19 2345  WBC 9.0 3.9* 7.7  RBC 4.56 4.38 4.38  HGB 14.1 13.9 13.9  HCT 44.9 41.4 41.2  MCV 98.5 94.5 94.1  MCH 30.9 31.7 31.7  MCHC 31.4 33.6 33.7  RDW 15.8* 15.7* 15.9*  PLT 373 307 323    BNPNo results for input(s): BNP, PROBNP in the last 168 hours.   DDimer No results for input(s): DDIMER in the last 168 hours.   Radiology    CT Angio Chest PE W and/or Wo Contrast  Result Date: 06/23/2019 CLINICAL DATA:  Respiratory distress. EXAM: CT ANGIOGRAPHY CHEST WITH CONTRAST TECHNIQUE: Multidetector CT imaging  of the chest was performed using the standard protocol during bolus administration of intravenous contrast. Multiplanar CT image reconstructions and MIPs were obtained to evaluate the vascular anatomy. CONTRAST:  169mL OMNIPAQUE IOHEXOL 350 MG/ML SOLN COMPARISON:  May 24, 2019 FINDINGS: Cardiovascular: A right-sided venous Port-A-Cath is in place. There is moderate severity calcification of the aortic arch. Satisfactory opacification of the pulmonary arteries to the segmental level. While no intraluminal filling defects are identified there is suspected occlusion of upper lobe branches of the right pulmonary artery as a result of a large paramediastinal mass (see below). Normal heart size. No pericardial effusion. Marked severity coronary artery calcification is seen. Mediastinum/Nodes: A stable approximately 10.6 cm x 4.2 cm right-sided paramediastinal soft tissue mass is  seen. This contains small areas of calcification and is unchanged in size and appearance when compared to the prior study. Lungs/Pleura: Mild stable areas of patchy scarring and/or atelectasis are seen within the left upper lobe and right lower lobe. Additional new mild areas of atelectasis and/or infiltrate are seen within the left upper lobe and right lower lobe. A 2.7 cm x 1.7 cm area of patchy consolidation is seen along the posteromedial aspect of the right lower lobe (axial CT image 79, CT series number 11). This is increased in size when compared to the prior study. There is a small right pleural effusion. Upper Abdomen: No acute abnormality. Musculoskeletal: Multilevel degenerative changes seen throughout the thoracic spine. Review of the MIP images confirms the above findings. IMPRESSION: 1. 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 2. Mild, stable areas of patchy left upper lobe and left lower lobe scarring and/or atelectasis with new areas of left upper lobe and right lower lobe atelectasis and/or infiltrate noted. 3. Patchy area of right lower lobe consolidation which is increased in size when compared to the prior study. While this may represent worsening infiltrate, sequelae associated with an underlying neoplastic process cannot be excluded. 4. No visible areas of pulmonary emboli. 5. Small right pleural effusion. Electronically Signed   By: Virgina Norfolk M.D.   On: 06/23/2019 22:29   DG CHEST PORT 1 VIEW  Result Date: 06/25/2019 CLINICAL DATA:  Shortness of breath. EXAM: PORTABLE CHEST 1 VIEW COMPARISON:  CT 06/23/2019.  Chest x-ray 06/23/2019. FINDINGS: PowerPort catheter in stable position. Surgical sutures left chest. Heart size stable. Stable appearance of right paramediastinal mass. Patchy bilateral pulmonary infiltrates again noted, these are best identified by  prior CT of 06/23/2019. Atelectatic changes left lung base. Stable right base pleural thickening. No pneumothorax. IMPRESSION: 1.  PowerPort catheter in stable position. 2.  Stable appearance of known right paramediastinal mass. 3. Patchy bilateral pulmonary infiltrates again noted, these are best identified by prior CT of 06/23/2019. Atelectatic changes left base. Stable right pleural thickening. Electronically Signed   By: Marcello Moores  Register   On: 06/25/2019 07:13   DG Chest Portable 1 View  Result Date: 06/23/2019 CLINICAL DATA:  Shortness of breath. EXAM: PORTABLE CHEST 1 VIEW COMPARISON:  April 14, 2019 FINDINGS: There is stable right-sided venous Port-A-Cath positioning. The lungs are hyperinflated. A stable paramediastinal opacity is again seen extending from the suprahilar region on the right. Very mild atelectasis and/or early infiltrate is seen within the left lung base. The heart size and mediastinal contours are within normal limits. Degenerative changes seen throughout the thoracic spine. IMPRESSION: 1. Very mild left basilar atelectasis and/or early infiltrate. 2. Stable  findings consistent with the patient's known right-sided paramediastinal mass. Electronically Signed   By: Virgina Norfolk M.D.   On: 06/23/2019 20:27   ECHOCARDIOGRAM COMPLETE  Result Date: 06/24/2019   ECHOCARDIOGRAM REPORT   Patient Name:   LEXIANNA WEINRICH Date of Exam: 06/24/2019 Medical Rec #:  [id number]        Height:       62.0 in Accession #:    [id number]       Weight:       101.9 lb Date of Birth:  1932-08-30         BSA:          1.44 m Patient Age:    43 years         BP:           110/68 mmHg Patient Gender: F                HR:           57 bpm. Exam Location:  Inpatient Procedure: 2D Echo Indications:    Elevated Troponin  History:        Patient has prior history of Echocardiogram examinations, most                 recent 03/13/2019. History of covid 19.  Sonographer:    Vikki Ports Turrentine Referring Phys: [id number]  JARED M GARDNER  Sonographer Comments: Technically difficult due to extremely small rib spacing. IMPRESSIONS  1. Left ventricular ejection fraction, by visual estimation, is 35%. The left ventricle has moderately decreased function. There is no left ventricular hypertrophy.  2. Left ventricular diastolic parameters are consistent with Grade I diastolic dysfunction (impaired relaxation).  3. The left ventricle demonstrates regional wall motion abnormalities. Unusual pattern of wall motion abnormalities. The basal to mid inferoseptal, anteroseptal, inferior and inferolateral walls are akinetic. Severe hypokinesis of the basal anterior and  basal anterolateral walls. Preservation of apical segments. Possible "reverse Takotsubo" picture, but need to rule out coronary disease.  4. Global right ventricle has normal systolic function.The right ventricular size is normal. No increase in right ventricular wall thickness.  5. Left atrial size was normal.  6. Right atrial size was normal.  7. Mild mitral annular calcification.  8. The mitral valve is normal in structure. Mild mitral valve regurgitation. No evidence of mitral stenosis.  9. The tricuspid valve is normal in structure. Tricuspid valve regurgitation is trivial. 10. The aortic valve is tricuspid. Aortic valve regurgitation is not visualized. Mild aortic valve sclerosis without stenosis. 11. TR signal is inadequate for assessing pulmonary artery systolic pressure. 12. The inferior vena cava is normal in size with greater than 50% respiratory variability, suggesting right atrial pressure of 3 mmHg. FINDINGS  Left Ventricle: Left ventricular ejection fraction, by visual estimation, is 35%. The left ventricle has moderately decreased function. The left ventricle demonstrates regional wall motion abnormalities. The left ventricular internal cavity size was the  left ventricle is normal in size. There is no left ventricular hypertrophy. Left ventricular diastolic  parameters are consistent with Grade I diastolic dysfunction (impaired relaxation). Right Ventricle: The right ventricular size is normal. No increase in right ventricular wall thickness. Global RV systolic function is has normal systolic function. Left Atrium: Left atrial size was normal in size. Right Atrium: Right atrial size was normal in size Pericardium: There is no evidence of pericardial effusion. Mitral Valve: The mitral valve is normal in structure. Mild mitral annular calcification. Mild mitral valve regurgitation. No  evidence of mitral valve stenosis by observation. Tricuspid Valve: The tricuspid valve is normal in structure. Tricuspid valve regurgitation is trivial. Aortic Valve: The aortic valve is tricuspid. Aortic valve regurgitation is not visualized. Mild aortic valve sclerosis is present, with no evidence of aortic valve stenosis. Pulmonic Valve: The pulmonic valve was normal in structure. Pulmonic valve regurgitation is not visualized. Pulmonic regurgitation is not visualized. Aorta: The aortic root is normal in size and structure. Venous: The inferior vena cava is normal in size with greater than 50% respiratory variability, suggesting right atrial pressure of 3 mmHg. IAS/Shunts: No atrial level shunt detected by color flow Doppler.  LEFT VENTRICLE PLAX 2D LVIDd:         4.20 cm  Diastology LVIDs:         3.40 cm  LV e' lateral:   3.70 cm/s LV PW:         0.70 cm  LV E/e' lateral: 13.3 LV IVS:        0.70 cm  LV e' medial:    3.48 cm/s LVOT diam:     1.60 cm  LV E/e' medial:  14.2 LV SV:         31 ml LV SV Index:   21.98 LVOT Area:     2.01 cm  RIGHT VENTRICLE TAPSE (M-mode): 2.1 cm LEFT ATRIUM             Index       RIGHT ATRIUM           Index LA diam:        4.10 cm 2.86 cm/m  RA Area:     11.20 cm LA Vol (A2C):   35.7 ml 24.88 ml/m RA Volume:   25.80 ml  17.98 ml/m LA Vol (A4C):   32.8 ml 22.86 ml/m LA Biplane Vol: 35.1 ml 24.46 ml/m  AORTIC VALVE LVOT Vmax:   81.60 cm/s LVOT Vmean:   54.200 cm/s LVOT VTI:    0.171 m  AORTA Ao Root diam: 2.50 cm MITRAL VALVE MV Area (PHT): 3.37 cm              SHUNTS MV PHT:        65.25 msec            Systemic VTI:  0.17 m MV Decel Time: 225 msec              Systemic Diam: 1.60 cm MV E velocity: 49.30 cm/s  103 cm/s MV A velocity: 110.00 cm/s 70.3 cm/s MV E/A ratio:  0.45        1.5  Loralie Champagne MD Electronically signed by Loralie Champagne MD Signature Date/Time: 06/24/2019/6:38:18 PM    Final     Cardiac Studies   ECHO 06/25/2019 New reduction in EF 35% with new wall motion abnormalities in an unusual pattern.  Patient Profile     86 y.o. female with long history of recurrent non-small cell right lung Ca, good functional status, roughly 3-1/2 weeks after mildly symptomatic COVID-19 pneumonia, paroxysmal atrial fibrillation with previous systemic embolism, presents with 1 week of exertional dyspnea followed by abrupt onset dyspnea at rest, new IVCD on ECG and marked elevation in hsTrop I, new echo wall motion abnormalities in a pattern not typical for CAD  Assessment & Plan    1. 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 For cath today.  Not a CABG candidate, but PCI should  be considered, if appropriate. 2. Parox AFib: in SR now, on amio and Eliquis (last dose 06/22/19, now on heparin). CHADS at least 5 (age, embolism, low EF, +/- CAD). 3. HLP: LDL104, "allergy" to simvastatin. Reassess lipid lowering indication based on cath findings. 4. R Lung Ca:  S/p surgery, XRT, chemo, recurrent, but well-controlled on chemo for >12 years. Good functional status. Lives independently. Sees Dr. Julien Nordmann.    The cardiac catheterization procedure (and possible PCI procedure) has been fully reviewed with the patient and written informed consent has been obtained.   For questions or updates, please contact [place] Please consult [email or web address] for contact info under         Signed, Sanda Klein, MD  06/25/2019, 8:08 AM

## 2019-06-25 NOTE — H&P (View-Only) (Signed)
Progress Note  Patient Name: [name] Date of Encounter: 06/25/2019  Primary Cardiologist: Kate Sable, MD   Subjective   Feels better, no dyspnea walking to the bathroom. No angina.  Inpatient Medications    Scheduled Meds: . albuterol  1-2 puff Inhalation Q4H  . amiodarone  200 mg Oral Daily  . aspirin EC  81 mg Oral Daily  . Ipratropium-Albuterol  1 puff Inhalation Q6H  . metoprolol succinate  50 mg Oral Daily  . multivitamin with minerals  1 tablet Oral Daily  . sodium chloride flush  3 mL Intravenous Q12H   Continuous Infusions: . sodium chloride    . sodium chloride 1 mL/kg/hr (06/25/19 0620)  . heparin 750 Units/hr (06/24/19 1618)   PRN Meds: sodium chloride, acetaminophen, chlorpheniramine-HYDROcodone, dextromethorphan, nitroGLYCERIN, ondansetron (ZOFRAN) IV, sodium chloride flush   Vital Signs    Vitals:   06/24/19 1622 06/24/19 1934 06/24/19 2356 06/25/19 0321  BP: 120/71 114/68 124/66 99/82  Pulse: (!) 55 (!) 56 (!) 56 (!) 52  Resp: 20 15 (!) 21 16  Temp:  (!) 97.4 F (36.3 C) 97.6 F (36.4 C) 97.9 F (36.6 C)  TempSrc:  Oral Oral Oral  SpO2: 99% 98% 100% 97%  Weight:      Height:        Intake/Output Summary (Last 24 hours) at 06/25/2019 3646 Last data filed at 06/25/2019 0700 Gross per 24 hour  Intake 201.49 ml  Output --  Net 201.49 ml   Last 3 Weights 06/24/2019 05/26/2019 05/04/2019  Weight (lbs) 101 lb 13.6 oz 104 lb 3.2 oz 107 lb  Weight (kg) 46.2 kg 47.265 kg 48.535 kg      Telemetry    NSR - Personally Reviewed  ECG    No new tracing - Personally Reviewed  Physical Exam  Appears well GEN: No acute distress.   Neck: No JVD Cardiac: RRR, no murmurs, rubs, or gallops.  Respiratory: Clear to auscultation bilaterally. GI: Soft, nontender, non-distended  MS: No edema; No deformity. Neuro:  Nonfocal  Psych: Normal affect   Labs    High Sensitivity Troponin:   Recent Labs  Lab 06/23/19 1928 06/23/19 2137   TROPONINIHS 834* 2,935*      Chemistry Recent Labs  Lab 06/23/19 1928 06/24/19 0757 06/24/19 2345  NA 134* 135 134*  K 4.1 4.6 4.0  CL 99 102 100  CO2 26 22 24   GLUCOSE 192* 134* 121*  BUN 20 16 21   CREATININE 0.81 0.71 0.80  CALCIUM 8.7* 8.6* 8.6*  PROT 7.6 6.9 6.7  ALBUMIN 3.1* 2.8* 2.8*  AST 45* 57* 49*  ALT 38 34 33  ALKPHOS 132* 109 101  BILITOT 0.7 0.9 0.7  GFRNONAA >60 >60 >60  GFRAA >60 >60 >60  ANIONGAP 9 11 10      Hematology Recent Labs  Lab 06/23/19 1928 06/24/19 0757 06/24/19 2345  WBC 9.0 3.9* 7.7  RBC 4.56 4.38 4.38  HGB 14.1 13.9 13.9  HCT 44.9 41.4 41.2  MCV 98.5 94.5 94.1  MCH 30.9 31.7 31.7  MCHC 31.4 33.6 33.7  RDW 15.8* 15.7* 15.9*  PLT 373 307 323    BNPNo results for input(s): BNP, PROBNP in the last 168 hours.   DDimer No results for input(s): DDIMER in the last 168 hours.   Radiology    CT Angio Chest PE W and/or Wo Contrast  Result Date: 06/23/2019 CLINICAL DATA:  Respiratory distress. EXAM: CT ANGIOGRAPHY CHEST WITH CONTRAST TECHNIQUE: Multidetector CT imaging  of the chest was performed using the standard protocol during bolus administration of intravenous contrast. Multiplanar CT image reconstructions and MIPs were obtained to evaluate the vascular anatomy. CONTRAST:  18mL OMNIPAQUE IOHEXOL 350 MG/ML SOLN COMPARISON:  May 24, 2019 FINDINGS: Cardiovascular: A right-sided venous Port-A-Cath is in place. There is moderate severity calcification of the aortic arch. Satisfactory opacification of the pulmonary arteries to the segmental level. While no intraluminal filling defects are identified there is suspected occlusion of upper lobe branches of the right pulmonary artery as a result of a large paramediastinal mass (see below). Normal heart size. No pericardial effusion. Marked severity coronary artery calcification is seen. Mediastinum/Nodes: A stable approximately 10.6 cm x 4.2 cm right-sided paramediastinal soft tissue mass is  seen. This contains small areas of calcification and is unchanged in size and appearance when compared to the prior study. Lungs/Pleura: Mild stable areas of patchy scarring and/or atelectasis are seen within the left upper lobe and right lower lobe. Additional new mild areas of atelectasis and/or infiltrate are seen within the left upper lobe and right lower lobe. A 2.7 cm x 1.7 cm area of patchy consolidation is seen along the posteromedial aspect of the right lower lobe (axial CT image 79, CT series number 11). This is increased in size when compared to the prior study. There is a small right pleural effusion. Upper Abdomen: No acute abnormality. Musculoskeletal: Multilevel degenerative changes seen throughout the thoracic spine. Review of the MIP images confirms the above findings. IMPRESSION: 1. 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 2. Mild, stable areas of patchy left upper lobe and left lower lobe scarring and/or atelectasis with new areas of left upper lobe and right lower lobe atelectasis and/or infiltrate noted. 3. Patchy area of right lower lobe consolidation which is increased in size when compared to the prior study. While this may represent worsening infiltrate, sequelae associated with an underlying neoplastic process cannot be excluded. 4. No visible areas of pulmonary emboli. 5. Small right pleural effusion. Electronically Signed   By: Virgina Norfolk M.D.   On: 06/23/2019 22:29   DG CHEST PORT 1 VIEW  Result Date: 06/25/2019 CLINICAL DATA:  Shortness of breath. EXAM: PORTABLE CHEST 1 VIEW COMPARISON:  CT 06/23/2019.  Chest x-ray 06/23/2019. FINDINGS: PowerPort catheter in stable position. Surgical sutures left chest. Heart size stable. Stable appearance of right paramediastinal mass. Patchy bilateral pulmonary infiltrates again noted, these are best identified by  prior CT of 06/23/2019. Atelectatic changes left lung base. Stable right base pleural thickening. No pneumothorax. IMPRESSION: 1.  PowerPort catheter in stable position. 2.  Stable appearance of known right paramediastinal mass. 3. Patchy bilateral pulmonary infiltrates again noted, these are best identified by prior CT of 06/23/2019. Atelectatic changes left base. Stable right pleural thickening. Electronically Signed   By: Marcello Moores  Register   On: 06/25/2019 07:13   DG Chest Portable 1 View  Result Date: 06/23/2019 CLINICAL DATA:  Shortness of breath. EXAM: PORTABLE CHEST 1 VIEW COMPARISON:  April 14, 2019 FINDINGS: There is stable right-sided venous Port-A-Cath positioning. The lungs are hyperinflated. A stable paramediastinal opacity is again seen extending from the suprahilar region on the right. Very mild atelectasis and/or early infiltrate is seen within the left lung base. The heart size and mediastinal contours are within normal limits. Degenerative changes seen throughout the thoracic spine. IMPRESSION: 1. Very mild left basilar atelectasis and/or early infiltrate. 2. Stable  findings consistent with the patient's known right-sided paramediastinal mass. Electronically Signed   By: Virgina Norfolk M.D.   On: 06/23/2019 20:27   ECHOCARDIOGRAM COMPLETE  Result Date: 06/24/2019   ECHOCARDIOGRAM REPORT   Patient Name:   REMIE MATHISON Date of Exam: 06/24/2019 Medical Rec #:  [id number]        Height:       62.0 in Accession #:    [id number]       Weight:       101.9 lb Date of Birth:  1933/02/26         BSA:          1.44 m Patient Age:    86 years         BP:           110/68 mmHg Patient Gender: F                HR:           57 bpm. Exam Location:  Inpatient Procedure: 2D Echo Indications:    Elevated Troponin  History:        Patient has prior history of Echocardiogram examinations, most                 recent 03/13/2019. History of covid 19.  Sonographer:    Vikki Ports Turrentine Referring Phys: [id number]  JARED M GARDNER  Sonographer Comments: Technically difficult due to extremely small rib spacing. IMPRESSIONS  1. Left ventricular ejection fraction, by visual estimation, is 35%. The left ventricle has moderately decreased function. There is no left ventricular hypertrophy.  2. Left ventricular diastolic parameters are consistent with Grade I diastolic dysfunction (impaired relaxation).  3. The left ventricle demonstrates regional wall motion abnormalities. Unusual pattern of wall motion abnormalities. The basal to mid inferoseptal, anteroseptal, inferior and inferolateral walls are akinetic. Severe hypokinesis of the basal anterior and  basal anterolateral walls. Preservation of apical segments. Possible "reverse Takotsubo" picture, but need to rule out coronary disease.  4. Global right ventricle has normal systolic function.The right ventricular size is normal. No increase in right ventricular wall thickness.  5. Left atrial size was normal.  6. Right atrial size was normal.  7. Mild mitral annular calcification.  8. The mitral valve is normal in structure. Mild mitral valve regurgitation. No evidence of mitral stenosis.  9. The tricuspid valve is normal in structure. Tricuspid valve regurgitation is trivial. 10. The aortic valve is tricuspid. Aortic valve regurgitation is not visualized. Mild aortic valve sclerosis without stenosis. 11. TR signal is inadequate for assessing pulmonary artery systolic pressure. 12. The inferior vena cava is normal in size with greater than 50% respiratory variability, suggesting right atrial pressure of 3 mmHg. FINDINGS  Left Ventricle: Left ventricular ejection fraction, by visual estimation, is 35%. The left ventricle has moderately decreased function. The left ventricle demonstrates regional wall motion abnormalities. The left ventricular internal cavity size was the  left ventricle is normal in size. There is no left ventricular hypertrophy. Left ventricular diastolic  parameters are consistent with Grade I diastolic dysfunction (impaired relaxation). Right Ventricle: The right ventricular size is normal. No increase in right ventricular wall thickness. Global RV systolic function is has normal systolic function. Left Atrium: Left atrial size was normal in size. Right Atrium: Right atrial size was normal in size Pericardium: There is no evidence of pericardial effusion. Mitral Valve: The mitral valve is normal in structure. Mild mitral annular calcification. Mild mitral valve regurgitation. No  evidence of mitral valve stenosis by observation. Tricuspid Valve: The tricuspid valve is normal in structure. Tricuspid valve regurgitation is trivial. Aortic Valve: The aortic valve is tricuspid. Aortic valve regurgitation is not visualized. Mild aortic valve sclerosis is present, with no evidence of aortic valve stenosis. Pulmonic Valve: The pulmonic valve was normal in structure. Pulmonic valve regurgitation is not visualized. Pulmonic regurgitation is not visualized. Aorta: The aortic root is normal in size and structure. Venous: The inferior vena cava is normal in size with greater than 50% respiratory variability, suggesting right atrial pressure of 3 mmHg. IAS/Shunts: No atrial level shunt detected by color flow Doppler.  LEFT VENTRICLE PLAX 2D LVIDd:         4.20 cm  Diastology LVIDs:         3.40 cm  LV e' lateral:   3.70 cm/s LV PW:         0.70 cm  LV E/e' lateral: 13.3 LV IVS:        0.70 cm  LV e' medial:    3.48 cm/s LVOT diam:     1.60 cm  LV E/e' medial:  14.2 LV SV:         31 ml LV SV Index:   21.98 LVOT Area:     2.01 cm  RIGHT VENTRICLE TAPSE (M-mode): 2.1 cm LEFT ATRIUM             Index       RIGHT ATRIUM           Index LA diam:        4.10 cm 2.86 cm/m  RA Area:     11.20 cm LA Vol (A2C):   35.7 ml 24.88 ml/m RA Volume:   25.80 ml  17.98 ml/m LA Vol (A4C):   32.8 ml 22.86 ml/m LA Biplane Vol: 35.1 ml 24.46 ml/m  AORTIC VALVE LVOT Vmax:   81.60 cm/s LVOT Vmean:   54.200 cm/s LVOT VTI:    0.171 m  AORTA Ao Root diam: 2.50 cm MITRAL VALVE MV Area (PHT): 3.37 cm              SHUNTS MV PHT:        65.25 msec            Systemic VTI:  0.17 m MV Decel Time: 225 msec              Systemic Diam: 1.60 cm MV E velocity: 49.30 cm/s  103 cm/s MV A velocity: 110.00 cm/s 70.3 cm/s MV E/A ratio:  0.45        1.5  Loralie Champagne MD Electronically signed by Loralie Champagne MD Signature Date/Time: 06/24/2019/6:38:18 PM    Final     Cardiac Studies   ECHO 06/25/2019 New reduction in EF 35% with new wall motion abnormalities in an unusual pattern.  Patient Profile     86 y.o. female with long history of recurrent non-small cell right lung Ca, good functional status, roughly 3-1/2 weeks after mildly symptomatic COVID-19 pneumonia, paroxysmal atrial fibrillation with previous systemic embolism, presents with 1 week of exertional dyspnea followed by abrupt onset dyspnea at rest, new IVCD on ECG and marked elevation in hsTrop I, new echo wall motion abnormalities in a pattern not typical for CAD  Assessment & Plan    1. 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 For cath today.  Not a CABG candidate, but PCI should  be considered, if appropriate. 2. Parox AFib: in SR now, on amio and Eliquis (last dose 06/22/19, now on heparin). CHADS at least 5 (age, embolism, low EF, +/- CAD). 3. HLP: LDL104, "allergy" to simvastatin. Reassess lipid lowering indication based on cath findings. 4. R Lung Ca:  S/p surgery, XRT, chemo, recurrent, but well-controlled on chemo for >12 years. Good functional status. Lives independently. Sees Dr. Julien Nordmann.    The cardiac catheterization procedure (and possible PCI procedure) has been fully reviewed with the patient and written informed consent has been obtained.   For questions or updates, please contact [facility] Please consult [email or web address] for contact info under         Signed, Sanda Klein, MD  06/25/2019, 8:08 AM

## 2019-06-25 NOTE — Discharge Instructions (Signed)
Stress-Induced Cardiomyopathy  Stress-induced cardiomyopathy is a disease of the heart muscle that is caused by extreme stress. It is also known as broken heart syndrome, apical ballooning syndrome, or takotsubo cardiomyopathy. Cardiomyopathy makes the heart muscle thick, weak, or stiff. In stress-induced cardiomyopathy, the heart weakens and bulges. When this happens, it cannot pump blood properly. People often mistake this condition for a heart attack because the most common symptom is chest pain (angina). Stress-induced cardiomyopathy can lead to severe heart failure. However, the failure is usually short-term. Most people with this condition make a full recovery. What are the causes? The exact cause of this condition is not known. Symptoms are usually caused by extreme stress brought on by anger, surprise, or grief (such as the death of a loved one or a divorce). Body chemicals (hormones) released while experiencing stress may affect the heart's ability to pump blood. What increases the risk? You are more likely to develop this condition if:  You are a woman, especially if you have gone through menopause (postmenopausal).  You are Asian or white (Caucasian).  You have no history of heart disease. What are the signs or symptoms? Common symptoms of this condition include:  Sudden angina.  Shortness of breath. Other symptoms include:  Irregular or fast heartbeats (arrhythmia).  Fainting.  Low blood pressure.  The heart not being able to pump enough blood (cardiogenic shock).  Heart failure. How is this diagnosed? This condition is diagnosed based on:  Your symptoms and medical history.  A physical exam.  Blood testing. This is done to check for troponin levels in the blood. Troponins are proteins that are released when there is damage to the heart muscle.  An electrocardiogram (ECG). This test is done to check for problems with electrical activity in the heart.  An  echocardiogram. This test involves making an image of your heart using sound waves. It can show how well your heart is pumping blood.  Coronary angiogram. During this test, a thin tube (catheter) is inserted into a blood vessel, and dye is injected into your bloodstream (cardiac catheterization). The dye appears on an X-ray and shows how blood flows through the arteries in your heart.  Ventriculogram. This test is done during a coronary angiogram. A dye is injected into your heart. The dye appears on an X-ray and shows how well the upper left chamber of the heart (left ventricle) can pump blood.  Chest X-ray.  MRI. How is this treated? This condition may be treated with:  Medicines. These may include: ? Anti-anxiety medicines to manage stress. ? ACE inhibitors to lower blood pressure. ? Diuretics to reduce swelling and prevent fluid buildup in the lungs. ? Beta-blockers to lower blood pressure and heart rate. They also reduce the workload of the heart muscle.  Lifestyle changes, such as managing stress and losing weight. You may also be treated for complications caused by stress-induced cardiomyopathy, such as heart failure, shock, or heart rhythm problems. Follow these instructions at home: Medicines  Take over-the-counter and prescription medicines only as told by your health care provider. Eating and drinking  Eat a healthy diet. This includes plenty of fruits and vegetables, lean meats, whole grains, and fat-free or low-fat dairy products.  Avoid foods that are high in salt (sodium), sugar, saturated fat, and trans fat.  Limit alcohol intake to no more than 1 drink a day for nonpregnant women and no more than 2 drinks a day for men. One drink equals 12 oz of beer, 5  oz of wine, or 1 oz of hard liquor. Lifestyle  Manage your response to stress. Try to do this with relaxation exercises, mindfulness, yoga, quiet time, or meditation.  Think about taking part in a stress management  program. Ask your health care provider for recommendations.  Do not use any products that contain nicotine or tobacco, such as cigarettes and e-cigarettes. If you need help quitting, ask your health care provider.  Maintain a healthy weight. Ask your health care provider what a healthy weight is for you.  Weigh yourself daily. This will alert you and your health care provider if you have sudden fluid buildup in your body. General instructions  Exercise regularly. Aim for 150 minutes of moderate exercise (such as walking) or 75 minutes of vigorous exercise (such as running) each week. Ask your health care provider what exercises are safe for you. This may include an exercise program called cardiac rehabilitation.  Keep all follow-up visits as told by your health care provider. This is important. Contact a health care provider if:  You have any side effects from your medicines, such as depression or fast or irregular heartbeats (palpitations).  You develop new symptoms, such as: ? Shortness of breath after exercising. ? Increased fatigue. ? Swelling of your ankles, feet, legs, or abdomen. ? Nausea or a lack of appetite. ? Rapid heartbeat.  You have sudden weight gain. Get help right away if:  You have sudden, severe chest pain.  You have shortness of breath that does not go away.  You faint. Summary  Stress-induced cardiomyopathy is caused by extreme stress brought on by anger, surprise, or grief. This disease is also known as broken heart syndrome.  People often mistake this condition for a heart attack because the most common symptom is chest pain (angina).  With the right medical treatment, most people with this condition make a full recovery.  You are more likely to develop this condition if you are a woman who has gone through menopause. This information is not intended to replace advice given to you by your health care provider. Make sure you discuss any questions you have  with your health care provider. Document Revised: 05/02/2017 Document Reviewed: 09/20/2016 Elsevier Patient Education  2020 [employer].

## 2019-06-25 NOTE — Progress Notes (Signed)
Cardiology Office Note:    Date:  07/01/2019   ID:  [name], DOB [birth year], MRN [id number]  PCP:  [name] Caraway, MD  Cardiologist:  [name] Klein, MD   Referring MD: [name] Caraway, MD   Chief Complaint  Patient presents with  . Hospitalization Follow-up    HFrPF  Takotsubo CM  History of Present Illness:    [name] is a 86 y.o. female with a hx of recent COVID-19 infection (06/02/19), lung cancer (for 13 years s/p lobectomy, radiation, and now on maintenance chemotherapy), systolic heart failure with EF of 35%, atrial fibrillation/flutter on chronic anticoagulation with eliquis, hx of right radial embolectomy for ischemic hand (03/13/19), migraines, and hyperlipidemia. She has chronic dyspnea and cough from lung cancer and radiation pneumonitis. She presented to [facility] with 1 week history of exertional dyspnea that progressed to dyspnea at rest. HS troponin 834 --> 2935. Given her functional status, left and right heart cath was obtained and revealed 80% lesion in D2, but otherwise normal coronaries. LVEDP was normal. No intervention. Echocardiogram with EF of 35% but signs of "reverse takotsubo" cardiomyopathy. Stress CM presumably from recent COVID infection. She was discharged on 06/25/19.  She presents today for follow up. She states that her breathing is worse and she can't walk very far at home. Her O2 sat for me is 96%. I walked with her in the office and her O2 sat stayed 98% but she was audibly wheezing. I do not appreciate lower extremity swelling or JVP. She does not weigh at home, but does not report abdominal fullness. She was discharged on albuterol rescue inhaler and combivent. Unfortunately, they were not properly taught to use the combivent prior to discharge. We reviewed proper use and reinforced to use this every 6 hrs. I am concerned that her wheezing is considerably worse. Fortunately, she was able to get an appt with [name] tomorrow. We appreciate her  input.    Past Medical History:  Diagnosis Date  . Atrial fibrillation ([place])   . FHx: chemotherapy 2008&2011   4 cycles cisplatin,taxotere,2008& carboplatin and Alimta 2011  . History of migraine headaches   . Hypercholesterolemia   . lung ca dx'd 06/2006   rt and lt lung  . Lung cancer ([place]) 3/14/1   right- Adenocarcinoma w/bronchioalveolar features  . Radiation 11/01/15-11/14/15   right upper lobe 30 gray  . Radiation pneumonitis ([place]) 01/24/2016    Past Surgical History:  Procedure Laterality Date  . CATARACT EXTRACTION  2013   bilateral  . EMBOLECTOMY Right 03/13/2019   Procedure: RIGHT BRACHIAL, RADIAL, ULNAR EMBOLECTOMY;  Surgeon: [name] Dutch, MD;  Location: [facility] OR;  Service: Vascular;  Laterality: Right;  . left  lowerlung lobectomy Left 06/19/2006   RachelBurney,Left lower lobectomy  . LEFT HEART CATH AND CORONARY ANGIOGRAPHY N/A 06/25/2019   Procedure: LEFT HEART CATH AND CORONARY ANGIOGRAPHY;  Surgeon: Martinique, Peter M, MD;  Location: [place] [facility];  Service: Cardiovascular;  Laterality: N/A;  . MYRINGOTOMY Bilateral 06/25/2016  . Porta-cath  2011    Current Medications: Current Meds  Medication Sig  . albuterol (VENTOLIN HFA) 108 (90 Base) MCG/ACT inhaler Inhale 2 puffs into the lungs every 6 (six) hours as needed for wheezing or shortness of breath.  Marland Kitchen amiodarone (PACERONE) 200 MG tablet Take 1 tablet (200 mg total) by mouth daily.  Marland Kitchen apixaban (ELIQUIS) 2.5 MG TABS tablet Take 1 tablet (2.5 mg total) by mouth 2 (two) times daily.  . furosemide (LASIX) 20  MG tablet Take 1 tablet (20 mg total) by mouth daily.  . Ipratropium-Albuterol (COMBIVENT RESPIMAT) 20-100 MCG/ACT AERS respimat Inhale 1 puff into the lungs every 6 (six) hours.  . metoprolol succinate (TOPROL-XL) 50 MG 24 hr tablet Take 1 tablet (50 mg total) by mouth daily. Take with or immediately following a meal.  Refills per cardiology or primary care  . Multiple Vitamin (MULTIVITAMIN WITH MINERALS) TABS  tablet Take 1 tablet by mouth daily.  . vitamin C (ASCORBIC ACID) 500 MG tablet Take 500 mg by mouth daily.     Allergies:   Simvastatin   Social History   Socioeconomic History  . Marital status: Widowed    Spouse name: Not on file  . Number of children: 2  . Years of education: Not on file  . Highest education level: Not on file  Occupational History  . Not on file  Tobacco Use  . Smoking status: Never Smoker  . Smokeless tobacco: Never Used  Substance and Sexual Activity  . Alcohol use: No  . Drug use: No  . Sexual activity: Not Currently  Other Topics Concern  . Not on file  Social History Narrative  . Not on file   Social Determinants of Health   Financial Resource Strain:   . Difficulty of Paying Living Expenses: Not on file  Food Insecurity:   . Worried About Charity fundraiser in the Last Year: Not on file  . Ran Out of Food in the Last Year: Not on file  Transportation Needs:   . Lack of Transportation (Medical): Not on file  . Lack of Transportation (Non-Medical): Not on file  Physical Activity:   . Days of Exercise per Week: Not on file  . Minutes of Exercise per Session: Not on file  Stress:   . Feeling of Stress : Not on file  Social Connections:   . Frequency of Communication with Friends and Family: Not on file  . Frequency of Social Gatherings with Friends and Family: Not on file  . Attends Religious Services: Not on file  . Active Member of Clubs or Organizations: Not on file  . Attends Archivist Meetings: Not on file  . Marital Status: Not on file     Family History: The patient's family history includes Hypertension in her father.  ROS:   Please see the history of present illness.     All other systems reviewed and are negative.  EKGs/Labs/Other Studies Reviewed:    The following studies were reviewed today:  Right and left heart catheterization 06/25/19  Prox LAD to Mid LAD lesion is 20% stenosed.  2nd Diag lesion is  80% stenosed.  LV end diastolic pressure is normal.   1. 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 Normal LVEDP  Plan: diagonal disease does not explain her clinical picture which is more c/w stress induced CM. Recommend medical management.    Echo 06/24/19:  1. Left ventricular ejection fraction, by visual estimation, is 35%. The left ventricle has moderately decreased function. There is no left ventricular hypertrophy.  2. Left ventricular diastolic parameters are consistent with Grade I diastolic dysfunction (impaired relaxation).  3. The left ventricle demonstrates regional wall motion abnormalities. Unusual pattern of wall motion abnormalities. The basal to mid inferoseptal, anteroseptal, inferior and inferolateral walls are akinetic. Severe hypokinesis of the basal anterior and  basal anterolateral walls. Preservation of apical segments. Possible "reverse Takotsubo" picture, but need to rule out coronary disease.  4.  Global right ventricle has normal systolic function.The right ventricular size is normal. No increase in right ventricular wall thickness.  5. Left atrial size was normal.  6. Right atrial size was normal.  7. Mild mitral annular calcification.  8. The mitral valve is normal in structure. Mild mitral valve regurgitation. No evidence of mitral stenosis.  9. The tricuspid valve is normal in structure. Tricuspid valve regurgitation is trivial. 10. The aortic valve is tricuspid. Aortic valve regurgitation is not visualized. Mild aortic valve sclerosis without stenosis. 11. TR signal is inadequate for assessing pulmonary artery systolic pressure. 12. The inferior vena cava is normal in size with greater than 50% respiratory variability, suggesting right atrial pressure of 3 mmHg.   EKG:  EKG is not ordered today.   Recent Labs: 05/26/2019: TSH 4.800 06/24/2019: ALT 33; BUN 21; Creatinine, Ser 0.80; Hemoglobin 13.9; Magnesium 2.1; Platelets 323; Potassium  4.0; Sodium 134  Recent Lipid Panel    Component Value Date/Time   CHOL 183 06/24/2019 0757   TRIG 47 06/24/2019 0757   HDL 70 06/24/2019 0757   CHOLHDL 2.6 06/24/2019 0757   VLDL 9 06/24/2019 0757   LDLCALC 104 (H) 06/24/2019 0757    Physical Exam:    VS:  BP 110/64   Pulse (!) 59   Ht 5\' 2"  (1.575 [name])   Wt 100 lb 3.2 oz (45.5 kg)   BMI 18.33 kg/[name]     Wt Readings from Last 3 Encounters:  07/01/19 100 lb 3.2 oz (45.5 kg)  06/24/19 101 lb 13.6 oz (46.2 kg)  05/26/19 104 lb 3.2 oz (47.3 kg)     GEN: elderly female NAD, cough and wheezing HEENT: Normal NECK: No JVD; No carotid bruits LYMPHATICS: No lymphadenopathy CARDIAC: RRR, no murmurs, rubs, gallops RESPIRATORY:  Wheezing throughout, nonproductive cough ABDOMEN: Soft, non-tender, non-distended MUSCULOSKELETAL:  No edema; No deformity  SKIN: Warm and dry NEUROLOGIC:  Alert and oriented x 3 PSYCHIATRIC:  Normal affect   ASSESSMENT:    1. Takotsubo cardiomyopathy   2. Chronic systolic heart failure (HCC)   3. Radiation pneumonitis ([place])   4. Wheezing   5. Paroxysmal atrial fibrillation (HCC)   6. Chronic anticoagulation   7. Hyperlipidemia LDL goal <70    PLAN:    In order of problems listed above:  Takotsubo cardiomyopathy Chronic systolic heart failure - new diagnosis - repeat echo in 3 months - discharged on low dose lasix - I do not suspect she is volume overloaded on exam, but will collect BMP and BNP today - if BNP is considerably elevated, I will increase lasix to 40 mg pending renal funciton   Wheezing - she is audibly wheezing at rest and with walking - proper inhaler use was reviewed - not hypoxic with walking - appreciate input from [name] - if needed, we may consider changing lopressor to bisoprolol - I hesitate to do this today since she hasn't had proper inhaler use and she has been stable on this BB for her Afib    Paroxysmal atrial fibrillation Chronic anticoagulation - continue  amiodarone, eliquis, and toprol This patients CHA2DS2-VASc Score and unadjusted Ischemic Stroke Rate (% per year) is equal to 11.2 % stroke rate/year from a score of 7 (2age, female, 2TE, CAD, CHF) - no bleeding problems   Hyperlipidemia 06/24/2019: Cholesterol 183; HDL 70; LDL Cholesterol 104; Triglycerides 47; VLDL 9 - LDL goal is now less than 70 - once her breathing is stable, will initiate low dose statin  Follow  up echo in 3 months. Follow up with Dr. Sallyanne Kuster after echo.  Medication Adjustments/Labs and Tests Ordered: Current medicines are reviewed at length with the patient today.  Concerns regarding medicines are outlined above.  Orders Placed This Encounter  Procedures  . Basic metabolic panel  . Brain natriuretic peptide   No orders of the defined types were placed in this encounter.   Signed, Ledora Bottcher, [place]  07/01/2019 4:39 PM    [facility]

## 2019-06-25 NOTE — Progress Notes (Signed)
No significant CAD on cath. Normal LV end-diastolic pressure. Strongly suspect takotsubo sd. Anticipate full recovery in 1-2 weeks. Repeat limited echo in 3 months, but f/u in clinic sooner (TOC visit post cath 1-2 weeks). No need for aspirin. Continue Eliquis. Low HR and low BP limits use of RAAS inhibitors or additional beta blockers. Low dose furosemide until f/u. Post cath bedrest 2 hours. If she can walk in hallway without dyspnea/hypoxia and no access site complications, OK for DC later today.  Sanda Klein, MD, [facility] [phone] office [phone] pager

## 2019-06-25 NOTE — Progress Notes (Signed)
PROGRESS NOTE    [name]  MGQ:[id number] DOB: 1932/07/05 DOA: 06/23/2019 PCP: [name], MD   Brief Narrative:  HPI per Jennette Kettle on 06/23/2019 HPI: [name] is a 86 y.o. female with medical history significant of A.Fib on Eliquis, B lung CA s/p radiation and chemo.  Looks like she last got treated with Opdivo (immune therapy) 05/26/2019.  Patient tested positive for COVID-19 on 06/03/2019.  She has been essentially asymptomatic from a COVID standpoint since testing.  No fever, no increased SOB until today.  Has chronic cough thanks to lung cancer which is unchanged, also has chronic loss of taste and smell thanks to CA / CA treatment which is unchanged and pre-existing the COVID diagnosis.  Today she had sudden onset respiratory distress at home.  EMS called.  O2 sats reportedly in the 60s initially, put on CPAP then NRB.   ED Course: Symptoms lasted very short period of time and are now resolved, she is now satting 100% on RA and says she feels much better.  CTA chest neg for PE, does show infiltrates that seem to be related to lung CA.  No fever, no SIRS.  EKG shows new LBBB that wasn't present in Nov 2020.  Initial trop is 800, repeat 2900!  (Trop was 39 back in Oct 2020).  Patient denies any chest pain.   Assessment & Plan:   Principal Problem:   ACS (acute coronary syndrome) (HCC) Active Problems:   Bilateral lung cancer ([place])   COVID-19 virus detected   Elevated troponin   Acute NSTEMI -New LBBB, rapid and significant Trop rise, concerning for ACS. Had an anginal Equivalent with Exertional Dyspnea  -Troponin Level went from 834 -> 2935 -CTA neg for PE as showed no visibile areas of the Pulmonary Emboli; TA did show a large predominantly stable partially calcified right.  Mediastinal soft tissue mass with subsequent occlusion of the upper lobe branches of the right pulmonary artery and mild stable areas of patchy left upper lobe and  left lower lobe scarring and/or atelectasis with new areas of upper left upper lobe and right lower lobe atelectasis and/or infiltrate noted.  Patchy areas of right lower lobe consolidation which is increased in size compared to prior study and there is also small right pleural effusion -ACS pathway initiated by Dr. Jennette Kettle -C/w Telemetry Monitoring  -Continue home Metoprolol Succinate 50 mg po Daily  -Heparin gtt initiated and will continue  -C/w ASA and Statin  -EKG showed NSR  -Repeat ECHO this visit (last was October 2020) -Cardiology Consulted and recommend recommended cardiac catheterization -This was performed on 1/22 without any obstructive coronary disease -Recommendations for medical management  COVID-19 -Diagnosed 06/03/2019  -CTA neg for PE as showed no visibile areas of the Pulmonary Emboli; TA did show a large predominantly stable partially calcified right.  Mediastinal soft tissue mass with subsequent occlusion of the upper lobe branches of the right pulmonary artery and mild stable areas of patchy left upper lobe and left lower lobe scarring and/or atelectasis with new areas of upper left upper lobe and right lower lobe atelectasis and/or infiltrate noted.  Patchy areas of right lower lobe consolidation which is increased in size compared to prior study and there is also small right pleural effusion -No fever, no persistent O2 requirement -Does have infiltrates though these seem to be chronic, and associated with her known lung cancer and the large R mediastinal mass -Dr. Gwendolyn Lima spoke with infection prevention and since she  is >21 days out from diagnosis without Fevers and Dyspnea has improved so she can be off of Isolation  -Will not check Inflammatory Markers currently given no real symptoms  -Saturating 98% on Room Air   B lung C Stage 4 with Lobectomy/Radiation and Chemo -Chronic Dyspnea  -Long term survivor, she has been battling lung CA since 2008 (13 years now),  disease recurrence after surgery in 2011. -Mostly stable on CT, just the 1 area of worsening infiltrate in RLL. -Looks like next Opdivo should be in a couple of days (on Q4 week treatments) -Continue cough suppression -Sees Mohamad Mohamad in the outpatient setting  -Recently had PET Scan which showed "7.6 cm medial right upper lobe/perihilar mass with associated hypermetabolism, suspicious for residual/recurrent tumor. Additional multifocal hypermetabolic patchy/nodular opacities in the lungs bilaterally. This appearance has been waxing/waning on priors, and therefore may reflect infection/inflammation, although metastases are not excluded in the setting of treatment. No evidence of metastatic disease in the abdomen/pelvis." -Will need Close Follow up after Discharge   Acute systolic CHF -Echocardiogram shows reduced ejection fraction, EF of 35% with wall motion abnormalities -Since cardiac cath did not show any obstructive coronary disease, suspect Takotsubo cardiomyopathy -Patient has been started on medical management -She will need repeat echocardiogram in the next few months -Started on daily Lasix.  Paroxysmal Atrial Fibrillation/Flutter -CHA2DS2-VASc of 3 -On Eliquis normally but transition to heparin infusion for cardiac cath -Resume Eliquis on discharge -C/w Amiodarone 200 mg po Daily and Metoprolol Succinate 50 mg po Daily   HLD -Checked Lipid Panel and showed a total cholesterol/HDL ratio 2.6, cholesterol level 183, HDL 70, LDL 104, triglycerides of 47 -C/w Simvastatin   DVT prophylaxis: Resume Eliquis on discharge Code Status: FULL CODE  Family Communication: Discussed with daughter at the bedside Disposition Plan: Discharge home later today if no complications from catheterization site and able to ambulate without shortness of breath  Consultants:   Cardiology    Procedures:  ECHOCARDIOGRAM ordered and pending  Left Heart Cath in AM    Antimicrobials:   Anti-infectives (From admission, onward)   None     Subjective: Feeling better.  No chest pain or shortness of breath.  Seen in her room post cardiac cath.  Objective: Vitals:   06/25/19 1500 06/25/19 1635 06/25/19 1815 06/25/19 1952  BP: 124/72 126/83  100/79  Pulse: (!) 55 (!) 57 61 (!) 110  Resp: (!) 22 13 15  (!) 22  Temp:  97.7 F (36.5 C)  (!) 97.5 F (36.4 C)  TempSrc:  Oral  Oral  SpO2: 97% 100% 100% 100%  Weight:      Height:        Intake/Output Summary (Last 24 hours) at 06/25/2019 2138 Last data filed at 06/25/2019 1700 Gross per 24 hour  Intake 635.9 ml  Output --  Net 635.9 ml   Filed Weights   06/24/19 0421  Weight: 46.2 kg   Examination: Physical Exam: General exam: Alert, awake, oriented x 3 Respiratory system: Clear to auscultation. Respiratory effort normal. Cardiovascular system:RRR. No murmurs, rubs, gallops. Gastrointestinal system: Abdomen is nondistended, soft and nontender. No organomegaly or masses felt. Normal bowel sounds heard. Central nervous system: Alert and oriented. No focal neurological deficits. Extremities: No C/C/E, +pedal pulses Skin: No rashes, lesions or ulcers Psychiatry: Judgement and insight appear normal. Mood & affect appropriate.    Data Reviewed: I have personally reviewed following labs and imaging studies  CBC: Recent Labs  Lab 06/23/19 1928 06/24/19 0757 06/24/19  2345  WBC 9.0 3.9* 7.7  NEUTROABS 6.1 2.5 4.8  HGB 14.1 13.9 13.9  HCT 44.9 41.4 41.2  MCV 98.5 94.5 94.1  PLT 373 307 683   Basic Metabolic Panel: Recent Labs  Lab 06/23/19 1928 06/24/19 0757 06/24/19 2345  NA 134* 135 134*  K 4.1 4.6 4.0  CL 99 102 100  CO2 26 22 24   GLUCOSE 192* 134* 121*  BUN 20 16 21   CREATININE 0.81 0.71 0.80  CALCIUM 8.7* 8.6* 8.6*  MG  --  2.1 2.1  PHOS  --  3.4 3.0   GFR: Estimated Creatinine Clearance: 38.2 mL/min (by C-G formula based on SCr of 0.8 mg/dL). Liver Function Tests: Recent Labs  Lab  06/23/19 1928 06/24/19 0757 06/24/19 2345  AST 45* 57* 49*  ALT 38 34 33  ALKPHOS 132* 109 101  BILITOT 0.7 0.9 0.7  PROT 7.6 6.9 6.7  ALBUMIN 3.1* 2.8* 2.8*   No results for input(s): LIPASE, AMYLASE in the last 168 hours. No results for input(s): AMMONIA in the last 168 hours. Coagulation Profile: No results for input(s): INR, PROTIME in the last 168 hours. Cardiac Enzymes: No results for input(s): CKTOTAL, CKMB, CKMBINDEX, TROPONINI in the last 168 hours. BNP (last 3 results) No results for input(s): PROBNP in the last 8760 hours. HbA1C: No results for input(s): HGBA1C in the last 72 hours. CBG: No results for input(s): GLUCAP in the last 168 hours. Lipid Profile: Recent Labs    06/24/19 0757  CHOL 183  HDL 70  LDLCALC 104*  TRIG 47  CHOLHDL 2.6   Thyroid Function Tests: No results for input(s): TSH, T4TOTAL, FREET4, T3FREE, THYROIDAB in the last 72 hours. Anemia Panel: No results for input(s): VITAMINB12, FOLATE, FERRITIN, TIBC, IRON, RETICCTPCT in the last 72 hours. Sepsis Labs: No results for input(s): PROCALCITON, LATICACIDVEN in the last 168 hours.  Recent Results (from the past 240 hour(s))  MRSA PCR Screening     Status: None   Collection Time: 06/24/19  4:20 AM   Specimen: Nasopharyngeal  Result Value Ref Range Status   MRSA by PCR NEGATIVE NEGATIVE Final    Comment:        The GeneXpert MRSA Assay (FDA approved for NASAL specimens only), is one component of a comprehensive MRSA colonization surveillance program. It is not intended to diagnose MRSA infection nor to guide or monitor treatment for MRSA infections. Performed at [facility], [place] [address] [place],  [address]     Radiology Studies: CT Angio Chest PE W and/or Wo Contrast  Result Date: 06/23/2019 CLINICAL DATA:  Respiratory distress. EXAM: CT ANGIOGRAPHY CHEST WITH CONTRAST TECHNIQUE: Multidetector CT imaging of the chest was performed using the standard protocol  during bolus administration of intravenous contrast. Multiplanar CT image reconstructions and MIPs were obtained to evaluate the vascular anatomy. CONTRAST:  170mL OMNIPAQUE IOHEXOL 350 MG/ML SOLN COMPARISON:  May 24, 2019 FINDINGS: Cardiovascular: A right-sided venous Port-A-Cath is in place. There is moderate severity calcification of the aortic arch. Satisfactory opacification of the pulmonary arteries to the segmental level. While no intraluminal filling defects are identified there is suspected occlusion of upper lobe branches of the right pulmonary artery as a result of a large paramediastinal mass (see below). Normal heart size. No pericardial effusion. Marked severity coronary artery calcification is seen. Mediastinum/Nodes: A stable approximately 10.6 cm x 4.2 cm right-sided paramediastinal soft tissue mass is seen. This contains small areas of calcification and is unchanged in size and  appearance when compared to the prior study. Lungs/Pleura: Mild stable areas of patchy scarring and/or atelectasis are seen within the left upper lobe and right lower lobe. Additional new mild areas of atelectasis and/or infiltrate are seen within the left upper lobe and right lower lobe. A 2.7 cm x 1.7 cm area of patchy consolidation is seen along the posteromedial aspect of the right lower lobe (axial CT image 79, CT series number 11). This is increased in size when compared to the prior study. There is a small right pleural effusion. Upper Abdomen: No acute abnormality. Musculoskeletal: Multilevel degenerative changes seen throughout the thoracic spine. Review of the MIP images confirms the above findings. IMPRESSION: 1. 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 2. Mild, stable areas of patchy left upper lobe and left lower lobe scarring and/or atelectasis with new areas of left upper lobe and  right lower lobe atelectasis and/or infiltrate noted. 3. Patchy area of right lower lobe consolidation which is increased in size when compared to the prior study. While this may represent worsening infiltrate, sequelae associated with an underlying neoplastic process cannot be excluded. 4. No visible areas of pulmonary emboli. 5. Small right pleural effusion. Electronically Signed   By: Virgina Norfolk M.D.   On: 06/23/2019 22:29   CARDIAC CATHETERIZATION  Result Date: 06/25/2019  Prox LAD to Mid LAD lesion is 20% stenosed.  2nd Diag lesion is 80% stenosed.  LV end diastolic pressure is normal.  1. 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 Normal LVEDP Plan: diagonal disease does not explain her clinical picture which is more c/w stress induced CM. Recommend medical management.   DG CHEST PORT 1 VIEW  Result Date: 06/25/2019 CLINICAL DATA:  Shortness of breath. EXAM: PORTABLE CHEST 1 VIEW COMPARISON:  CT 06/23/2019.  Chest x-ray 06/23/2019. FINDINGS: PowerPort catheter in stable position. Surgical sutures left chest. Heart size stable. Stable appearance of right paramediastinal mass. Patchy bilateral pulmonary infiltrates again noted, these are best identified by prior CT of 06/23/2019. Atelectatic changes left lung base. Stable right base pleural thickening. No pneumothorax. IMPRESSION: 1.  PowerPort catheter in stable position. 2.  Stable appearance of known right paramediastinal mass. 3. Patchy bilateral pulmonary infiltrates again noted, these are best identified by prior CT of 06/23/2019. Atelectatic changes left base. Stable right pleural thickening. Electronically Signed   By: Marcello Moores  Register   On: 06/25/2019 07:13   ECHOCARDIOGRAM COMPLETE  Result Date: 06/24/2019   ECHOCARDIOGRAM REPORT   Patient Name:   [name] Date of Exam: 06/24/2019 Medical Rec #:  [id number]        Height:       62.0 in Accession #:    [id number]       Weight:       101.9 lb Date of Birth:   Jan 11, 1933         BSA:          1.44 m Patient Age:    47 years         BP:           110/68 mmHg Patient Gender: F                HR:           57 bpm. Exam Location:  Inpatient Procedure: 2D Echo Indications:    Elevated Troponin  History:  Patient has prior history of Echocardiogram examinations, most                 recent 03/13/2019. History of covid 19.  Sonographer:    Vikki Ports Turrentine Referring Phys: 50 JARED M GARDNER  Sonographer Comments: Technically difficult due to extremely small rib spacing. IMPRESSIONS  1. Left ventricular ejection fraction, by visual estimation, is 35%. The left ventricle has moderately decreased function. There is no left ventricular hypertrophy.  2. Left ventricular diastolic parameters are consistent with Grade I diastolic dysfunction (impaired relaxation).  3. The left ventricle demonstrates regional wall motion abnormalities. Unusual pattern of wall motion abnormalities. The basal to mid inferoseptal, anteroseptal, inferior and inferolateral walls are akinetic. Severe hypokinesis of the basal anterior and  basal anterolateral walls. Preservation of apical segments. Possible "reverse Takotsubo" picture, but need to rule out coronary disease.  4. Global right ventricle has normal systolic function.The right ventricular size is normal. No increase in right ventricular wall thickness.  5. Left atrial size was normal.  6. Right atrial size was normal.  7. Mild mitral annular calcification.  8. The mitral valve is normal in structure. Mild mitral valve regurgitation. No evidence of mitral stenosis.  9. The tricuspid valve is normal in structure. Tricuspid valve regurgitation is trivial. 10. The aortic valve is tricuspid. Aortic valve regurgitation is not visualized. Mild aortic valve sclerosis without stenosis. 11. TR signal is inadequate for assessing pulmonary artery systolic pressure. 12. The inferior vena cava is normal in size with greater than 50% respiratory  variability, suggesting right atrial pressure of 3 mmHg. FINDINGS  Left Ventricle: Left ventricular ejection fraction, by visual estimation, is 35%. The left ventricle has moderately decreased function. The left ventricle demonstrates regional wall motion abnormalities. The left ventricular internal cavity size was the  left ventricle is normal in size. There is no left ventricular hypertrophy. Left ventricular diastolic parameters are consistent with Grade I diastolic dysfunction (impaired relaxation). Right Ventricle: The right ventricular size is normal. No increase in right ventricular wall thickness. Global RV systolic function is has normal systolic function. Left Atrium: Left atrial size was normal in size. Right Atrium: Right atrial size was normal in size Pericardium: There is no evidence of pericardial effusion. Mitral Valve: The mitral valve is normal in structure. Mild mitral annular calcification. Mild mitral valve regurgitation. No evidence of mitral valve stenosis by observation. Tricuspid Valve: The tricuspid valve is normal in structure. Tricuspid valve regurgitation is trivial. Aortic Valve: The aortic valve is tricuspid. Aortic valve regurgitation is not visualized. Mild aortic valve sclerosis is present, with no evidence of aortic valve stenosis. Pulmonic Valve: The pulmonic valve was normal in structure. Pulmonic valve regurgitation is not visualized. Pulmonic regurgitation is not visualized. Aorta: The aortic root is normal in size and structure. Venous: The inferior vena cava is normal in size with greater than 50% respiratory variability, suggesting right atrial pressure of 3 mmHg. IAS/Shunts: No atrial level shunt detected by color flow Doppler.  LEFT VENTRICLE PLAX 2D LVIDd:         4.20 cm  Diastology LVIDs:         3.40 cm  LV e' lateral:   3.70 cm/s LV PW:         0.70 cm  LV E/e' lateral: 13.3 LV IVS:        0.70 cm  LV e' medial:    3.48 cm/s LVOT diam:     1.60 cm  LV E/e' medial:  14.2 LV SV:         31 ml LV SV Index:   21.98 LVOT Area:     2.01 cm  RIGHT VENTRICLE TAPSE (M-mode): 2.1 cm LEFT ATRIUM             Index       RIGHT ATRIUM           Index LA diam:        4.10 cm 2.86 cm/m  RA Area:     11.20 cm LA Vol (A2C):   35.7 ml 24.88 ml/m RA Volume:   25.80 ml  17.98 ml/m LA Vol (A4C):   32.8 ml 22.86 ml/m LA Biplane Vol: 35.1 ml 24.46 ml/m  AORTIC VALVE LVOT Vmax:   81.60 cm/s LVOT Vmean:  54.200 cm/s LVOT VTI:    0.171 m  AORTA Ao Root diam: 2.50 cm MITRAL VALVE MV Area (PHT): 3.37 cm              SHUNTS MV PHT:        65.25 msec            Systemic VTI:  0.17 m MV Decel Time: 225 msec              Systemic Diam: 1.60 cm MV E velocity: 49.30 cm/s  103 cm/s MV A velocity: 110.00 cm/s 70.3 cm/s MV E/A ratio:  0.45        1.5  Loralie Champagne MD Electronically signed by Loralie Champagne MD Signature Date/Time: 06/24/2019/6:38:18 PM    Final    Scheduled Meds: . amiodarone  200 mg Oral Daily  . apixaban  2.5 mg Oral BID  . dextromethorphan-guaiFENesin  1 tablet Oral BID  . furosemide  20 mg Oral Daily  . Ipratropium-Albuterol  1 puff Inhalation BID  . methylPREDNISolone (SOLU-MEDROL) injection  40 mg Intravenous Q12H  . metoprolol succinate  50 mg Oral Daily  . multivitamin with minerals  1 tablet Oral Daily  . sodium chloride flush  3 mL Intravenous Q12H   Continuous Infusions: . sodium chloride      LOS: 2 days   Kathie Dike, MD [facility] PAGER is on [place]  If 7PM-7AM, please contact night-coverage [email or web address]  Addendum 18:30:  Informed by staff that patient was ambulating and became short of breath, wheezing and coughing.  This was not consistent with her baseline.  She received bronchodilators with some improvement, but continues to wheeze and cough.  She does not feel safe to discharge home.  Will cancel discharge for today, continue bronchodilators, start on steroids and continue mucolytic's.  Anticipate discharge in the next 24 hours if she  is improved.  [employer]

## 2019-06-25 NOTE — Progress Notes (Signed)
error 

## 2019-06-25 NOTE — Progress Notes (Signed)
Transported to the cath. Lab by bed awake and alert.

## 2019-06-25 NOTE — Progress Notes (Signed)
Eating dinner, resp- 18/min, pulse-ox-100% on room air,  Continue to monitor.

## 2019-06-25 NOTE — Interval H&P Note (Signed)
History and Physical Interval Note:  06/25/2019 11:18 AM  [name]  has presented today for surgery, with the diagnosis of chest pain.  The various methods of treatment have been discussed with the patient and family. After consideration of risks, benefits and other options for treatment, the patient has consented to  Procedure(s): LEFT HEART CATH AND CORONARY ANGIOGRAPHY (N/A) as a surgical intervention.  The patient's history has been reviewed, patient examined, no change in status, stable for surgery.  I have reviewed the patient's chart and labs.  Questions were answered to the patient's satisfaction.   Cath Lab Visit (complete for each Cath Lab visit)  Clinical Evaluation Leading to the Procedure:   ACS: Yes.    Non-ACS:    Anginal Classification: CCS III  Anti-ischemic medical therapy: Minimal Therapy (1 class of medications)  Non-Invasive Test Results: No non-invasive testing performed  Prior CABG: No previous CABG        Collier Salina [facility] 06/25/2019 11:18 AM

## 2019-06-25 NOTE — Progress Notes (Signed)
ANTICOAGULATION CONSULT NOTE  Pharmacy Consult for heparin Indication: ACS and Afib  Patient Measurements: Height: 5\' 2"  (157.5 cm) Weight: 101 lb 13.6 oz (46.2 kg) IBW/kg (Calculated) : 50.1 Heparin weight: 46 kg  Vital Signs: Temp: 97.6 F (36.4 C) (01/21 2356) Temp Source: Oral (01/21 2356) BP: 124/66 (01/21 2356) Pulse Rate: 56 (01/21 2356)  Labs: Recent Labs    06/23/19 1928 06/23/19 1928 06/23/19 2137 06/24/19 0757 06/24/19 1505 06/24/19 2345  HGB 14.1   < >  --  13.9  --  13.9  HCT 44.9  --   --  41.4  --  41.2  PLT 373  --   --  307  --  323  APTT  --   --   --   --  59* 73*  HEPARINUNFRC  --   --   --   --  0.87*  --   CREATININE 0.81  --   --  0.71  --  0.80  TROPONINIHS 834*  --  2,935*  --   --   --    < > = values in this interval not displayed.    Estimated Creatinine Clearance: 38.2 mL/min (by C-G formula based on SCr of 0.8 mg/dL).   Assessment: 86 yo W with hx afib (CHADS2VASc = 4) on apixaban admitted for NSTEMI. Apixaban held, last dose 1/20 ~9 am per med hx tech but told MD last dose was 1/20 PM before admission. Started on heparin infusion. Will follow aPTT until correlates with HL.   APTT 73 sec  Goal of Therapy:  Heparin level 0.3-0.7 units/ml aPTT 66-102 seconds Monitor platelets by anticoagulation protocol: Yes   Plan:  Continue heparin gtt at 750 units/hr Follow aPTT until correlates with HL Monitor daily aPTT, HL, CBC, plt Monitor for signs/symptoms of bleeding   Thanks for allowing pharmacy to be a part of this patient's care.  Excell Seltzer, PharmD Clinical Pharmacist

## 2019-06-25 NOTE — Progress Notes (Signed)
Initial deflation of TR band ,noted with bleeding. Air inflated total of 17 ml. And restarted deflation 30 min after.with no bleeding . Continue to monitor.

## 2019-06-25 NOTE — Progress Notes (Signed)
Back from the cath lab  By bed , awake and alert. TR band to right wrist in placed, elevated with pillow. Pulse ox to right thumb in placed.  Right radial pulse faint and fingers cold to touch.  Instructed to avoid moving arm and to call for any signs of bleeding.

## 2019-06-25 NOTE — Progress Notes (Signed)
Bleeding noted  on the TR band after  the 3rd deflation, inflated 3 ml  Of air , continue to monitor.

## 2019-06-26 NOTE — Progress Notes (Signed)
Patient has been in and out of AFib for a couple of hours now, with HR mostly between 90 and 120. BP stable. No new c/o CP. Hospitalist paged.

## 2019-06-26 NOTE — Discharge Summary (Signed)
[name] Kachel GYI:[id number] DOB: Jun 24, 1932 DOA: 06/23/2019  PCP: [name], MD  Admit date: 06/23/2019 Discharge date: 06/26/2019  Admitted From: Home Disposition: Home  Recommendations for Outpatient Follow-up:  1. Follow up with PCP in 1 week 2. Please obtain BMP/CBC in one week 3. Follow up with cardiology as schedule on 07/01/19  Home Health:none   Discharge Condition:Stable CODE STATUS:Full  Diet recommendation: Heart Healthy   Brief/Interim Summary:  [name] is a 86 y.o. female with medical history significant of A.Fib on Eliquis, B lung CA s/p radiation and chemo.  Looks like she last got treated with Opdivo (immune therapy) 05/26/2019.Patient tested positive for COVID-19 on 06/03/2019.  She has been essentially asymptomatic from a COVID standpoint since testing.    Presenting with 1 week of exertional dyspnea followed by abrupt onset of dyspnea at rest, new IVCD on ECG abnormality elevation in troponin.  Echocardiogram revealed wall motion abnormalities and LV dysfunction.  Cardiology was consulted and patient underwent left heart cath.  Full results stated below.  Cath showed 80% second diagonal lesion which did not explain the degree of cardiomyopathy seen on echo.  It was thought patient possibly could have Takotsubo cardiomyopathy.  She was managed for CHF.  She had rapid access A. fib and was now in sinus rhythm and on Eliquis.  She does not tolerate statins and cardiology will readdress this as outpatient.  She is stable to be discharged home from cardiac standpoint.  Ambulate and was oxygen saturating in the 92%.  She feels ready to go home.  Discharge Diagnoses:  Principal Problem:   ACS (acute coronary syndrome) ([place]) Active Problems:   Bilateral lung cancer ([place])   COVID-19 virus detected   Elevated troponin    Discharge Instructions  Discharge Instructions    Call MD for:  difficulty breathing, headache or visual disturbances   Complete by: As  directed    Diet - low sodium heart healthy   Complete by: As directed    Discharge instructions   Complete by: As directed    Monitor sodium intake <2g per day   Increase activity slowly   Complete by: As directed    Increase activity slowly   Complete by: As directed      Allergies as of 06/26/2019      Reactions   Simvastatin Other (See Comments)   Cluster migraines      Medication List    TAKE these medications   amiodarone 200 MG tablet Commonly known as: PACERONE Take 1 tablet (200 mg total) by mouth daily.   apixaban 2.5 MG Tabs tablet Commonly known as: ELIQUIS Take 1 tablet (2.5 mg total) by mouth 2 (two) times daily.   chlorpheniramine-HYDROcodone 10-8 MG/5ML Suer Commonly known as: TUSSIONEX Take 5 mLs by mouth 2 (two) times daily as needed for cough.   Delsym 30 MG/5ML liquid Generic drug: dextromethorphan Take 15 mg by mouth as needed for cough.   furosemide 20 MG tablet Commonly known as: LASIX Take 1 tablet (20 mg total) by mouth daily.   metoprolol succinate 50 MG 24 hr tablet Commonly known as: TOPROL-XL Take 1 tablet (50 mg total) by mouth daily. Take with or immediately following a meal.  Refills per cardiology or primary care   multivitamin with minerals Tabs tablet Take 1 tablet by mouth daily. Notes to patient: 06/26/19   vitamin C 500 MG tablet Commonly known as: ASCORBIC ACID Take 500 mg by mouth daily.  Follow-up Information    Ledora Bottcher, PA Follow up on 07/01/2019.   Specialties: Physician Assistant, Cardiology, Radiology Why: 3:30 pm [facility] Contact information: [address] [place] [address] [phone]        [place] Follow up in 3 week(s).   Specialty: Cardiology Why: for echocardiogram, office will call to schedule Contact information: [address] [place] [id number] [place] [phone]       Sanda Klein, MD Follow up  on 09/15/2019.   Specialty: Cardiology Why: 2:40 pm  Contact information: [address] [place] [address] [phone]        [name], MD Follow up in 1 week(s).   Specialty: Family Medicine Contact information: [place] [address] [phone]          Allergies  Allergen Reactions  . Simvastatin Other (See Comments)    Cluster migraines    Consultations:  Cardiology   Procedures/Studies: CT Angio Chest PE W and/or Wo Contrast  Result Date: 06/23/2019 CLINICAL DATA:  Respiratory distress. EXAM: CT ANGIOGRAPHY CHEST WITH CONTRAST TECHNIQUE: Multidetector CT imaging of the chest was performed using the standard protocol during bolus administration of intravenous contrast. Multiplanar CT image reconstructions and MIPs were obtained to evaluate the vascular anatomy. CONTRAST:  153mL OMNIPAQUE IOHEXOL 350 MG/ML SOLN COMPARISON:  May 24, 2019 FINDINGS: Cardiovascular: A right-sided venous Port-A-Cath is in place. There is moderate severity calcification of the aortic arch. Satisfactory opacification of the pulmonary arteries to the segmental level. While no intraluminal filling defects are identified there is suspected occlusion of upper lobe branches of the right pulmonary artery as a result of a large paramediastinal mass (see below). Normal heart size. No pericardial effusion. Marked severity coronary artery calcification is seen. Mediastinum/Nodes: A stable approximately 10.6 cm x 4.2 cm right-sided paramediastinal soft tissue mass is seen. This contains small areas of calcification and is unchanged in size and appearance when compared to the prior study. Lungs/Pleura: Mild stable areas of patchy scarring and/or atelectasis are seen within the left upper lobe and right lower lobe. Additional new mild areas of atelectasis and/or infiltrate are seen within the left upper lobe and right lower lobe. A 2.7 cm x 1.7 cm area of patchy  consolidation is seen along the posteromedial aspect of the right lower lobe (axial CT image 79, CT series number 11). This is increased in size when compared to the prior study. There is a small right pleural effusion. Upper Abdomen: No acute abnormality. Musculoskeletal: Multilevel degenerative changes seen throughout the thoracic spine. Review of the MIP images confirms the above findings. IMPRESSION: 1. 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 2. Mild, stable areas of patchy left upper lobe and left lower lobe scarring and/or atelectasis with new areas of left upper lobe and right lower lobe atelectasis and/or infiltrate noted. 3. Patchy area of right lower lobe consolidation which is increased in size when compared to the prior study. While this may represent worsening infiltrate, sequelae associated with an underlying neoplastic process cannot be excluded. 4. No visible areas of pulmonary emboli. 5. Small right pleural effusion. Electronically Signed   By: Virgina Norfolk M.D.   On: 06/23/2019 22:29   CARDIAC CATHETERIZATION  Result Date: 06/25/2019  Prox LAD to Mid LAD lesion is 20% stenosed.  2nd Diag lesion is 80% stenosed.  LV  end diastolic pressure is normal.  1. 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 Normal LVEDP Plan: diagonal disease does not explain her clinical picture which is more c/w stress induced CM. Recommend medical management.   NM PET Image Restag (PS) Skull Base To Thigh  Result Date: 06/16/2019 CLINICAL DATA:  Subsequent treatment strategy for non-small cell lung cancer. Status post chemotherapy and radiation. Immunotherapy ongoing. EXAM: NUCLEAR MEDICINE PET SKULL BASE TO THIGH TECHNIQUE: 5.6 mCi F-18 FDG was injected intravenously. Full-ring PET imaging was performed from the skull base to thigh after the radiotracer. CT data was  obtained and used for attenuation correction and anatomic localization. Fasting blood glucose: 99 mg/dl COMPARISON:  Multiple prior CTs, most recently 05/24/2019. PET-CT dated 12/24/2013. FINDINGS: Mediastinal blood pool activity: SUV max 2.3 Liver activity: SUV max NA NECK: No hypermetabolic cervical lymphadenopathy. Incidental CT findings: none CHEST: Medial right upper lobe/perihilar mass demonstrates focal hypermetabolism posteriorly and in the right hilar region, max SUV 13.9. Mass measures approximately 7.6 x 5.1 cm (series 4/image 50). Associated internal calcifications. Status post left lower lobectomy. Multifocal patchy/nodular opacities in the lungs bilaterally. For example: --1.7 cm irregular solid nodule in the anterior left upper lobe (series 8/image 13), max SUV 2.1 --1.9 cm predominantly solid nodule in the medial right lower lobe (series 8/image 37), max SUV 3.0 --2.9 cm irregular/patchy nodule in the posterior left lung base (series 8/image 53), max SUV 4.8 No hypermetabolic mediastinal lymphadenopathy. Incidental CT findings: Atherosclerotic calcifications of the aortic arch. Coronary atherosclerosis the LAD. Right chest port terminates at the cavoatrial junction. ABDOMEN/PELVIS: No hypermetabolic abdominopelvic lymphadenopathy. No abnormal hypermetabolism in the liver, spleen, pancreas, or adrenal glands. Incidental CT findings: Layering noncalcified gallstones. Atherosclerotic calcifications the abdominal aorta and branch vessels. Status post hysterectomy. 7.8 cm right pelvic cystic lesion (series 4/image 140), non FDG avid. SKELETON: No focal hypermetabolic activity to suggest skeletal metastasis. Incidental CT findings: Degenerative changes of the visualized thoracolumbar spine. IMPRESSION: 7.6 cm medial right upper lobe/perihilar mass with associated hypermetabolism, suspicious for residual/recurrent tumor. Additional multifocal hypermetabolic patchy/nodular opacities in the lungs bilaterally.  This appearance has been waxing/waning on priors, and therefore may reflect infection/inflammation, although metastases are not excluded in the setting of treatment. No evidence of metastatic disease in the abdomen/pelvis. Electronically Signed   By: Julian Hy M.D.   On: 06/16/2019 15:20   DG CHEST PORT 1 VIEW  Result Date: 06/25/2019 CLINICAL DATA:  Shortness of breath. EXAM: PORTABLE CHEST 1 VIEW COMPARISON:  CT 06/23/2019.  Chest x-ray 06/23/2019. FINDINGS: PowerPort catheter in stable position. Surgical sutures left chest. Heart size stable. Stable appearance of right paramediastinal mass. Patchy bilateral pulmonary infiltrates again noted, these are best identified by prior CT of 06/23/2019. Atelectatic changes left lung base. Stable right base pleural thickening. No pneumothorax. IMPRESSION: 1.  PowerPort catheter in stable position. 2.  Stable appearance of known right paramediastinal mass. 3. Patchy bilateral pulmonary infiltrates again noted, these are best identified by prior CT of 06/23/2019. Atelectatic changes left base. Stable right pleural thickening. Electronically Signed   By: Marcello Moores  Register   On: 06/25/2019 07:13   DG Chest Portable 1 View  Result Date: 06/23/2019 CLINICAL DATA:  Shortness of breath. EXAM: PORTABLE CHEST 1 VIEW COMPARISON:  April 14, 2019 FINDINGS: There is stable right-sided venous Port-A-Cath positioning. The lungs are hyperinflated. A stable paramediastinal opacity is again seen extending from the suprahilar region on the right. Very mild atelectasis and/or early infiltrate is seen within the left  lung base. The heart size and mediastinal contours are within normal limits. Degenerative changes seen throughout the thoracic spine. IMPRESSION: 1. Very mild left basilar atelectasis and/or early infiltrate. 2. Stable findings consistent with the patient's known right-sided paramediastinal mass. Electronically Signed   By: Virgina Norfolk M.D.   On: 06/23/2019  20:27   ECHOCARDIOGRAM COMPLETE  Result Date: 06/24/2019   ECHOCARDIOGRAM REPORT   Patient Name:   DANARIA LARSEN Date of Exam: 06/24/2019 Medical Rec #:  [id number]        Height:       62.0 in Accession #:    [id number]       Weight:       101.9 lb Date of Birth:  Jan 27, 1933         BSA:          1.44 m Patient Age:    86 years         BP:           110/68 mmHg Patient Gender: F                HR:           57 bpm. Exam Location:  Inpatient Procedure: 2D Echo Indications:    Elevated Troponin  History:        Patient has prior history of Echocardiogram examinations, most                 recent 03/13/2019. History of covid 19.  Sonographer:    Vikki Ports Turrentine Referring Phys: 22 JARED M GARDNER  Sonographer Comments: Technically difficult due to extremely small rib spacing. IMPRESSIONS  1. Left ventricular ejection fraction, by visual estimation, is 35%. The left ventricle has moderately decreased function. There is no left ventricular hypertrophy.  2. Left ventricular diastolic parameters are consistent with Grade I diastolic dysfunction (impaired relaxation).  3. The left ventricle demonstrates regional wall motion abnormalities. Unusual pattern of wall motion abnormalities. The basal to mid inferoseptal, anteroseptal, inferior and inferolateral walls are akinetic. Severe hypokinesis of the basal anterior and  basal anterolateral walls. Preservation of apical segments. Possible "reverse Takotsubo" picture, but need to rule out coronary disease.  4. Global right ventricle has normal systolic function.The right ventricular size is normal. No increase in right ventricular wall thickness.  5. Left atrial size was normal.  6. Right atrial size was normal.  7. Mild mitral annular calcification.  8. The mitral valve is normal in structure. Mild mitral valve regurgitation. No evidence of mitral stenosis.  9. The tricuspid valve is normal in structure. Tricuspid valve regurgitation is trivial. 10. The aortic valve  is tricuspid. Aortic valve regurgitation is not visualized. Mild aortic valve sclerosis without stenosis. 11. TR signal is inadequate for assessing pulmonary artery systolic pressure. 12. The inferior vena cava is normal in size with greater than 50% respiratory variability, suggesting right atrial pressure of 3 mmHg. FINDINGS  Left Ventricle: Left ventricular ejection fraction, by visual estimation, is 35%. The left ventricle has moderately decreased function. The left ventricle demonstrates regional wall motion abnormalities. The left ventricular internal cavity size was the  left ventricle is normal in size. There is no left ventricular hypertrophy. Left ventricular diastolic parameters are consistent with Grade I diastolic dysfunction (impaired relaxation). Right Ventricle: The right ventricular size is normal. No increase in right ventricular wall thickness. Global RV systolic function is has normal systolic function. Left Atrium: Left atrial size was normal in size. Right Atrium: Right atrial  size was normal in size Pericardium: There is no evidence of pericardial effusion. Mitral Valve: The mitral valve is normal in structure. Mild mitral annular calcification. Mild mitral valve regurgitation. No evidence of mitral valve stenosis by observation. Tricuspid Valve: The tricuspid valve is normal in structure. Tricuspid valve regurgitation is trivial. Aortic Valve: The aortic valve is tricuspid. Aortic valve regurgitation is not visualized. Mild aortic valve sclerosis is present, with no evidence of aortic valve stenosis. Pulmonic Valve: The pulmonic valve was normal in structure. Pulmonic valve regurgitation is not visualized. Pulmonic regurgitation is not visualized. Aorta: The aortic root is normal in size and structure. Venous: The inferior vena cava is normal in size with greater than 50% respiratory variability, suggesting right atrial pressure of 3 mmHg. IAS/Shunts: No atrial level shunt detected by color  flow Doppler.  LEFT VENTRICLE PLAX 2D LVIDd:         4.20 cm  Diastology LVIDs:         3.40 cm  LV e' lateral:   3.70 cm/s LV PW:         0.70 cm  LV E/e' lateral: 13.3 LV IVS:        0.70 cm  LV e' medial:    3.48 cm/s LVOT diam:     1.60 cm  LV E/e' medial:  14.2 LV SV:         31 ml LV SV Index:   21.98 LVOT Area:     2.01 cm  RIGHT VENTRICLE TAPSE (M-mode): 2.1 cm LEFT ATRIUM             Index       RIGHT ATRIUM           Index LA diam:        4.10 cm 2.86 cm/m  RA Area:     11.20 cm LA Vol (A2C):   35.7 ml 24.88 ml/m RA Volume:   25.80 ml  17.98 ml/m LA Vol (A4C):   32.8 ml 22.86 ml/m LA Biplane Vol: 35.1 ml 24.46 ml/m  AORTIC VALVE LVOT Vmax:   81.60 cm/s LVOT Vmean:  54.200 cm/s LVOT VTI:    0.171 m  AORTA Ao Root diam: 2.50 cm MITRAL VALVE MV Area (PHT): 3.37 cm              SHUNTS MV PHT:        65.25 msec            Systemic VTI:  0.17 m MV Decel Time: 225 msec              Systemic Diam: 1.60 cm MV E velocity: 49.30 cm/s  103 cm/s MV A velocity: 110.00 cm/s 70.3 cm/s MV E/A ratio:  0.45        1.5  Loralie Champagne MD Electronically signed by Loralie Champagne MD Signature Date/Time: 06/24/2019/6:38:18 PM    Final       Subjective: Seen and examined in morning when she is going home.  States have minimal dyspnea on exertion, no cp.   Discharge Exam: Vitals:   06/26/19 0725 06/26/19 1143  BP: 107/75 131/70  Pulse: 69 64  Resp: 20 20  Temp: (!) 97.4 F (36.3 C) 97.6 F (36.4 C)  SpO2: 95%    Vitals:   06/26/19 0425 06/26/19 0705 06/26/19 0725 06/26/19 1143  BP: 106/73  107/75 131/70  Pulse: (!) 58  69 64  Resp: 17  20 20   Temp: (!) 97.4 F (36.3 C)  (!)  97.4 F (36.3 C) 97.6 F (36.4 C)  TempSrc: Oral  Oral Oral  SpO2: 100% 99% 95%   Weight:      Height:        General: Pt is alert, awake, not in acute distress Cardiovascular: RRR, S1/S2 +, no rubs, no gallops Respiratory: CTA bilaterally, no wheezing, no rhonchi Abdominal: Soft, NT, ND, bowel sounds + Extremities: no  edema, no cyanosis    The results of significant diagnostics from this hospitalization (including imaging, microbiology, ancillary and laboratory) are listed below for reference.     Microbiology: Recent Results (from the past 240 hour(s))  MRSA PCR Screening     Status: None   Collection Time: 06/24/19  4:20 AM   Specimen: Nasopharyngeal  Result Value Ref Range Status   MRSA by PCR NEGATIVE NEGATIVE Final    Comment:        The GeneXpert MRSA Assay (FDA approved for NASAL specimens only), is one component of a comprehensive MRSA colonization surveillance program. It is not intended to diagnose MRSA infection nor to guide or monitor treatment for MRSA infections. Performed at [facility], [place] [address] [place], [place] [address]      Labs: BNP (last 3 results) No results for input(s): BNP in the last 8760 hours. Basic Metabolic Panel: Recent Labs  Lab 06/23/19 1928 06/24/19 0757 06/24/19 2345  NA 134* 135 134*  K 4.1 4.6 4.0  CL 99 102 100  CO2 26 22 24   GLUCOSE 192* 134* 121*  BUN 20 16 21   CREATININE 0.81 0.71 0.80  CALCIUM 8.7* 8.6* 8.6*  MG  --  2.1 2.1  PHOS  --  3.4 3.0   Liver Function Tests: Recent Labs  Lab 06/23/19 1928 06/24/19 0757 06/24/19 2345  AST 45* 57* 49*  ALT 38 34 33  ALKPHOS 132* 109 101  BILITOT 0.7 0.9 0.7  PROT 7.6 6.9 6.7  ALBUMIN 3.1* 2.8* 2.8*   No results for input(s): LIPASE, AMYLASE in the last 168 hours. No results for input(s): AMMONIA in the last 168 hours. CBC: Recent Labs  Lab 06/23/19 1928 06/24/19 0757 06/24/19 2345  WBC 9.0 3.9* 7.7  NEUTROABS 6.1 2.5 4.8  HGB 14.1 13.9 13.9  HCT 44.9 41.4 41.2  MCV 98.5 94.5 94.1  PLT 373 307 323   Cardiac Enzymes: No results for input(s): CKTOTAL, CKMB, CKMBINDEX, TROPONINI in the last 168 hours. BNP: Invalid input(s): POCBNP CBG: No results for input(s): GLUCAP in the last 168 hours. D-Dimer No results for input(s): DDIMER in the last 72 hours. Hgb  A1c No results for input(s): HGBA1C in the last 72 hours. Lipid Profile Recent Labs    06/24/19 0757  CHOL 183  HDL 70  LDLCALC 104*  TRIG 47  CHOLHDL 2.6   Thyroid function studies No results for input(s): TSH, T4TOTAL, T3FREE, THYROIDAB in the last 72 hours.  Invalid input(s): FREET3 Anemia work up No results for input(s): VITAMINB12, FOLATE, FERRITIN, TIBC, IRON, RETICCTPCT in the last 72 hours. Urinalysis    Component Value Date/Time   LABSPEC 1.030 03/17/2012 1051   PHURINE 5.0 03/17/2012 1051   HGBUR Negative 03/17/2012 1051   BILIRUBINUR Negative 03/17/2012 1051   KETONESUR Negative 03/17/2012 1051   PROTEINUR Negative 03/17/2012 1051   NITRITE Negative 03/17/2012 1051   LEUKOCYTESUR Negative 03/17/2012 1051   Sepsis Labs Invalid input(s): PROCALCITONIN,  WBC,  LACTICIDVEN Microbiology Recent Results (from the past 240 hour(s))  MRSA PCR Screening  Status: None   Collection Time: 06/24/19  4:20 AM   Specimen: Nasopharyngeal  Result Value Ref Range Status   MRSA by PCR NEGATIVE NEGATIVE Final    Comment:        The GeneXpert MRSA Assay (FDA approved for NASAL specimens only), is one component of a comprehensive MRSA colonization surveillance program. It is not intended to diagnose MRSA infection nor to guide or monitor treatment for MRSA infections. Performed at [facility], [place] [address], [place], [place] [address]      Time coordinating discharge: Over 30 minutes  SIGNED:   Nolberto Hanlon, MD  [facility] 06/26/2019, 12:36 PM Pager   If 7PM-7AM, please contact night-coverage [email or web address] Password [email or web address]

## 2019-06-26 NOTE — Plan of Care (Signed)
  Problem: Education: Goal: Knowledge of General Education information will improve Description: Including pain rating scale, medication(s)/side effects and non-pharmacologic comfort measures Outcome: Progressing   Problem: Health Behavior/Discharge Planning: Goal: Ability to manage health-related needs will improve Outcome: Progressing   Problem: Clinical Measurements: Goal: Ability to maintain clinical measurements within normal limits will improve Outcome: Progressing Goal: Will remain free from infection Outcome: Progressing Goal: Diagnostic test results will improve Outcome: Progressing Goal: Respiratory complications will improve Outcome: Progressing Goal: Cardiovascular complication will be avoided Outcome: Progressing   Problem: Activity: Goal: Risk for activity intolerance will decrease Outcome: Progressing   Problem: Nutrition: Goal: Adequate nutrition will be maintained Outcome: Progressing   Problem: Coping: Goal: Level of anxiety will decrease Outcome: Progressing   Problem: Elimination: Goal: Will not experience complications related to bowel motility Outcome: Progressing Goal: Will not experience complications related to urinary retention Outcome: Progressing   Problem: Pain Managment: Goal: General experience of comfort will improve Outcome: Progressing   Problem: Safety: Goal: Ability to remain free from injury will improve Outcome: Progressing   Problem: Skin Integrity: Goal: Risk for impaired skin integrity will decrease Outcome: Progressing   Problem: Respiratory: Goal: Verbalizations of increased ease of respirations will increase Outcome: Progressing   Problem: Education: Goal: Understanding of CV disease, CV risk reduction, and recovery process will improve Outcome: Progressing Goal: Individualized Educational Video(s) Outcome: Progressing   Problem: Activity: Goal: Ability to return to baseline activity level will improve Outcome:  Progressing   Problem: Cardiovascular: Goal: Ability to achieve and maintain adequate cardiovascular perfusion will improve Outcome: Progressing Goal: Vascular access site(s) Level 0-1 will be maintained Outcome: Progressing   Problem: Health Behavior/Discharge Planning: Goal: Ability to safely manage health-related needs after discharge will improve Outcome: Progressing

## 2019-06-26 NOTE — Progress Notes (Signed)
Pt discharged home with daughter, taken to front of hospital via wheelchair. Discharge instructions given to daughter and Pt.

## 2019-06-26 NOTE — Progress Notes (Signed)
Progress Note  Patient Name: [name] Date of Encounter: 06/26/2019  Primary Cardiologist: Kate Sable, MD   Subjective   Feels well today, "when can I go home."  Inpatient Medications    Scheduled Meds: . amiodarone  200 mg Oral Daily  . apixaban  2.5 mg Oral BID  . dextromethorphan-guaiFENesin  1 tablet Oral BID  . furosemide  20 mg Oral Daily  . Ipratropium-Albuterol  1 puff Inhalation BID  . methylPREDNISolone (SOLU-MEDROL) injection  40 mg Intravenous Q12H  . metoprolol succinate  50 mg Oral Daily  . multivitamin with minerals  1 tablet Oral Daily  . sodium chloride flush  3 mL Intravenous Q12H   Continuous Infusions: . sodium chloride     PRN Meds: sodium chloride, acetaminophen, albuterol, chlorpheniramine-HYDROcodone, nitroGLYCERIN, ondansetron (ZOFRAN) IV, sodium chloride flush   Vital Signs    Vitals:   06/25/19 2303 06/26/19 0425 06/26/19 0705 06/26/19 0725  BP: 97/68 106/73  107/75  Pulse: (!) 110 (!) 58  69  Resp: 15 17  20   Temp: (!) 97.5 F (36.4 C) (!) 97.4 F (36.3 C)  (!) 97.4 F (36.3 C)  TempSrc: Oral Oral  Oral  SpO2: 99% 100% 99% 95%  Weight:      Height:        Intake/Output Summary (Last 24 hours) at 06/26/2019 0749 Last data filed at 06/25/2019 1700 Gross per 24 hour  Intake 522.2 ml  Output -  Net 522.2 ml   Last 3 Weights 06/24/2019 05/26/2019 05/04/2019  Weight (lbs) 101 lb 13.6 oz 104 lb 3.2 oz 107 lb  Weight (kg) 46.2 kg 47.265 kg 48.535 kg      Telemetry    Afib yesterday, currently in sinus - Personally Reviewed  ECG    06/25/19 sinus brady, long qt - Personally Reviewed  Physical Exam  Constitutional: No acute distress Eyes: sclera non-icteric, normal conjunctiva and lids ENMT: normal dentition, moist mucous membranes Cardiovascular: regular rhythm, normal rate, no murmurs. S1 and S2 normal. Radial pulses normal bilaterally. No jugular venous distention.  Respiratory: clear to auscultation  bilaterally GI : normal bowel sounds, soft and nontender. No distention.   MSK: extremities warm, well perfused. No edema.  NEURO: grossly nonfocal exam, moves all extremities. PSYCH: alert and oriented x 3, normal mood and affect.   Labs    High Sensitivity Troponin:   Recent Labs  Lab 06/23/19 1928 06/23/19 2137  TROPONINIHS 834* 2,935*      Chemistry Recent Labs  Lab 06/23/19 1928 06/24/19 0757 06/24/19 2345  NA 134* 135 134*  K 4.1 4.6 4.0  CL 99 102 100  CO2 26 22 24   GLUCOSE 192* 134* 121*  BUN 20 16 21   CREATININE 0.81 0.71 0.80  CALCIUM 8.7* 8.6* 8.6*  PROT 7.6 6.9 6.7  ALBUMIN 3.1* 2.8* 2.8*  AST 45* 57* 49*  ALT 38 34 33  ALKPHOS 132* 109 101  BILITOT 0.7 0.9 0.7  GFRNONAA >60 >60 >60  GFRAA >60 >60 >60  ANIONGAP 9 11 10      Hematology Recent Labs  Lab 06/23/19 1928 06/24/19 0757 06/24/19 2345  WBC 9.0 3.9* 7.7  RBC 4.56 4.38 4.38  HGB 14.1 13.9 13.9  HCT 44.9 41.4 41.2  MCV 98.5 94.5 94.1  MCH 30.9 31.7 31.7  MCHC 31.4 33.6 33.7  RDW 15.8* 15.7* 15.9*  PLT 373 307 323    BNPNo results for input(s): BNP, PROBNP in the last 168 hours.   DDimer  No results for input(s): DDIMER in the last 168 hours.   Radiology    CARDIAC CATHETERIZATION  Result Date: 06/25/2019  Prox LAD to Mid LAD lesion is 20% stenosed.  2nd Diag lesion is 80% stenosed.  LV end diastolic pressure is normal.  1. 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 Normal LVEDP Plan: diagonal disease does not explain her clinical picture which is more c/w stress induced CM. Recommend medical management.   DG CHEST PORT 1 VIEW  Result Date: 06/25/2019 CLINICAL DATA:  Shortness of breath. EXAM: PORTABLE CHEST 1 VIEW COMPARISON:  CT 06/23/2019.  Chest x-ray 06/23/2019. FINDINGS: PowerPort catheter in stable position. Surgical sutures left chest. Heart size stable. Stable appearance of right paramediastinal mass. Patchy bilateral pulmonary infiltrates again noted,  these are best identified by prior CT of 06/23/2019. Atelectatic changes left lung base. Stable right base pleural thickening. No pneumothorax. IMPRESSION: 1.  PowerPort catheter in stable position. 2.  Stable appearance of known right paramediastinal mass. 3. Patchy bilateral pulmonary infiltrates again noted, these are best identified by prior CT of 06/23/2019. Atelectatic changes left base. Stable right pleural thickening. Electronically Signed   By: Marcello Moores  Register   On: 06/25/2019 07:13   ECHOCARDIOGRAM COMPLETE  Result Date: 06/24/2019   ECHOCARDIOGRAM REPORT   Patient Name:   [name] Date of Exam: 06/24/2019 Medical Rec #:  [id number]        Height:       62.0 in Accession #:    [id number]       Weight:       101.9 lb Date of Birth:  1932-08-29         BSA:          1.44 m Patient Age:    43 years         BP:           110/68 mmHg Patient Gender: F                HR:           57 bpm. Exam Location:  Inpatient Procedure: 2D Echo Indications:    Elevated Troponin  History:        Patient has prior history of Echocardiogram examinations, most                 recent 03/13/2019. History of covid 19.  Sonographer:    Vikki Ports Turrentine Referring Phys: [id number] JARED M GARDNER  Sonographer Comments: Technically difficult due to extremely small rib spacing. IMPRESSIONS  1. Left ventricular ejection fraction, by visual estimation, is 35%. The left ventricle has moderately decreased function. There is no left ventricular hypertrophy.  2. Left ventricular diastolic parameters are consistent with Grade I diastolic dysfunction (impaired relaxation).  3. The left ventricle demonstrates regional wall motion abnormalities. Unusual pattern of wall motion abnormalities. The basal to mid inferoseptal, anteroseptal, inferior and inferolateral walls are akinetic. Severe hypokinesis of the basal anterior and  basal anterolateral walls. Preservation of apical segments. Possible "reverse Takotsubo" picture, but need to rule  out coronary disease.  4. Global right ventricle has normal systolic function.The right ventricular size is normal. No increase in right ventricular wall thickness.  5. Left atrial size was normal.  6. Right atrial size was normal.  7. Mild mitral annular calcification.  8. The mitral valve is normal in structure. Mild mitral valve regurgitation. No evidence of mitral stenosis.  9. The tricuspid valve is normal in structure. Tricuspid  valve regurgitation is trivial. 10. The aortic valve is tricuspid. Aortic valve regurgitation is not visualized. Mild aortic valve sclerosis without stenosis. 11. TR signal is inadequate for assessing pulmonary artery systolic pressure. 12. The inferior vena cava is normal in size with greater than 50% respiratory variability, suggesting right atrial pressure of 3 mmHg. FINDINGS  Left Ventricle: Left ventricular ejection fraction, by visual estimation, is 35%. The left ventricle has moderately decreased function. The left ventricle demonstrates regional wall motion abnormalities. The left ventricular internal cavity size was the  left ventricle is normal in size. There is no left ventricular hypertrophy. Left ventricular diastolic parameters are consistent with Grade I diastolic dysfunction (impaired relaxation). Right Ventricle: The right ventricular size is normal. No increase in right ventricular wall thickness. Global RV systolic function is has normal systolic function. Left Atrium: Left atrial size was normal in size. Right Atrium: Right atrial size was normal in size Pericardium: There is no evidence of pericardial effusion. Mitral Valve: The mitral valve is normal in structure. Mild mitral annular calcification. Mild mitral valve regurgitation. No evidence of mitral valve stenosis by observation. Tricuspid Valve: The tricuspid valve is normal in structure. Tricuspid valve regurgitation is trivial. Aortic Valve: The aortic valve is tricuspid. Aortic valve regurgitation is not  visualized. Mild aortic valve sclerosis is present, with no evidence of aortic valve stenosis. Pulmonic Valve: The pulmonic valve was normal in structure. Pulmonic valve regurgitation is not visualized. Pulmonic regurgitation is not visualized. Aorta: The aortic root is normal in size and structure. Venous: The inferior vena cava is normal in size with greater than 50% respiratory variability, suggesting right atrial pressure of 3 mmHg. IAS/Shunts: No atrial level shunt detected by color flow Doppler.  LEFT VENTRICLE PLAX 2D LVIDd:         4.20 cm  Diastology LVIDs:         3.40 cm  LV e' lateral:   3.70 cm/s LV PW:         0.70 cm  LV E/e' lateral: 13.3 LV IVS:        0.70 cm  LV e' medial:    3.48 cm/s LVOT diam:     1.60 cm  LV E/e' medial:  14.2 LV SV:         31 ml LV SV Index:   21.98 LVOT Area:     2.01 cm  RIGHT VENTRICLE TAPSE (M-mode): 2.1 cm LEFT ATRIUM             Index       RIGHT ATRIUM           Index LA diam:        4.10 cm 2.86 cm/m  RA Area:     11.20 cm LA Vol (A2C):   35.7 ml 24.88 ml/m RA Volume:   25.80 ml  17.98 ml/m LA Vol (A4C):   32.8 ml 22.86 ml/m LA Biplane Vol: 35.1 ml 24.46 ml/m  AORTIC VALVE LVOT Vmax:   81.60 cm/s LVOT Vmean:  54.200 cm/s LVOT VTI:    0.171 m  AORTA Ao Root diam: 2.50 cm MITRAL VALVE MV Area (PHT): 3.37 cm              SHUNTS MV PHT:        65.25 msec            Systemic VTI:  0.17 m MV Decel Time: 225 msec  Systemic Diam: 1.60 cm MV E velocity: 49.30 cm/s  103 cm/s MV A velocity: 110.00 cm/s 70.3 cm/s MV E/A ratio:  0.45        1.5  Loralie Champagne MD Electronically signed by Loralie Champagne MD Signature Date/Time: 06/24/2019/6:38:18 PM    Final     Cardiac Studies   ECHO 06/25/2019 New reduction in EF 35% with new wall motion abnormalities in an unusual pattern.  Patient Profile     86 y.o. female with long history of recurrent non-small cell right lung Ca, good functional status, roughly 3-1/2 weeks after mildly symptomatic COVID-19  pneumonia, paroxysmal atrial fibrillation with previous systemic embolism, presents with 1 week of exertional dyspnea followed by abrupt onset dyspnea at rest, new IVCD on ECG and marked elevation in hsTrop I, new echo wall motion abnormalities in a pattern not typical for CAD.  Cath showed 80% second diagonal lesion, which would not explain the degree of cardiomyopathy seen on echo.  Assessment & Plan    1. Acute systolic HF: Probable takotsubo CMP, degree of CAD on cath does not explain degree of cardiomyopathy.  Continues on furosemide 20 mg daily.  Can reassess volume status at follow-up appointment on 07/01/2019 with Angie Duke. 2. Parox AFib: in SR now, but had rapid atrial fibrillation yesterday, on amio 200 mg daily and Eliquis, Eliquis restarted yesterday evening 2.5 mg twice daily.  CHADS at least 5 (age, embolism, low EF, CAD). 3. HLP: LDL104, "allergy" to simvastatin.  Consider statin therapy given CAD on cath.  This can be discussed as an outpatient. 4. R Lung Ca:  S/p surgery, XRT, chemo, recurrent, but well-controlled on chemo for >12 years. Good functional status. Lives independently. Sees Dr. Julien Nordmann. 5.  CAD -there is a second diagonal lesion, demonstrating CAD though the CAD is not enough to explain her cardiomyopathy, we should continue secondary prevention of CAD.  She is on Eliquis, metoprolol succinate 50 mg daily.  Not currently on a statin due to a "allergy", consider pharmacy consultation as an outpatient for discussion of lipid-lowering therapy.  For questions or updates, please contact [place] Please consult [email or web address] for contact info under        Signed, Elouise Munroe, MD  06/26/2019, 7:49 AM

## 2019-06-26 NOTE — Plan of Care (Signed)
  Problem: Education: Goal: Knowledge of General Education information will improve Description: Including pain rating scale, medication(s)/side effects and non-pharmacologic comfort measures Outcome: Adequate for Discharge   Problem: Health Behavior/Discharge Planning: Goal: Ability to manage health-related needs will improve Outcome: Adequate for Discharge   Problem: Clinical Measurements: Goal: Ability to maintain clinical measurements within normal limits will improve Outcome: Adequate for Discharge Goal: Will remain free from infection Outcome: Adequate for Discharge Goal: Diagnostic test results will improve Outcome: Adequate for Discharge Goal: Respiratory complications will improve Outcome: Adequate for Discharge Goal: Cardiovascular complication will be avoided Outcome: Adequate for Discharge   Problem: Activity: Goal: Risk for activity intolerance will decrease Outcome: Adequate for Discharge   Problem: Nutrition: Goal: Adequate nutrition will be maintained Outcome: Adequate for Discharge   Problem: Coping: Goal: Level of anxiety will decrease Outcome: Adequate for Discharge   Problem: Elimination: Goal: Will not experience complications related to bowel motility Outcome: Adequate for Discharge Goal: Will not experience complications related to urinary retention Outcome: Adequate for Discharge   Problem: Pain Managment: Goal: General experience of comfort will improve Outcome: Adequate for Discharge   Problem: Safety: Goal: Ability to remain free from injury will improve Outcome: Adequate for Discharge   Problem: Skin Integrity: Goal: Risk for impaired skin integrity will decrease Outcome: Adequate for Discharge   Problem: Respiratory: Goal: Verbalizations of increased ease of respirations will increase Outcome: Adequate for Discharge   Problem: Education: Goal: Understanding of CV disease, CV risk reduction, and recovery process will  improve Outcome: Adequate for Discharge Goal: Individualized Educational Video(s) Outcome: Adequate for Discharge   Problem: Activity: Goal: Ability to return to baseline activity level will improve Outcome: Adequate for Discharge   Problem: Cardiovascular: Goal: Ability to achieve and maintain adequate cardiovascular perfusion will improve Outcome: Adequate for Discharge Goal: Vascular access site(s) Level 0-1 will be maintained Outcome: Adequate for Discharge   Problem: Health Behavior/Discharge Planning: Goal: Ability to safely manage health-related needs after discharge will improve Outcome: Adequate for Discharge

## 2019-07-01 ENCOUNTER — Ambulatory Visit: Payer: Medicare PPO | Admitting: Physician Assistant

## 2019-07-01 ENCOUNTER — Other Ambulatory Visit: Payer: Self-pay

## 2019-07-01 ENCOUNTER — Encounter: Payer: Self-pay | Admitting: Physician Assistant

## 2019-07-01 VITALS — BP 110/64 | HR 59 | Ht 62.0 in | Wt 100.2 lb

## 2019-07-01 DIAGNOSIS — I5022 Chronic systolic (congestive) heart failure: Secondary | ICD-10-CM

## 2019-07-01 DIAGNOSIS — J700 Acute pulmonary manifestations due to radiation: Secondary | ICD-10-CM | POA: Diagnosis not present

## 2019-07-01 DIAGNOSIS — R062 Wheezing: Secondary | ICD-10-CM | POA: Diagnosis not present

## 2019-07-01 DIAGNOSIS — Z7901 Long term (current) use of anticoagulants: Secondary | ICD-10-CM

## 2019-07-01 DIAGNOSIS — E785 Hyperlipidemia, unspecified: Secondary | ICD-10-CM

## 2019-07-01 DIAGNOSIS — I480 Paroxysmal atrial fibrillation: Secondary | ICD-10-CM

## 2019-07-01 DIAGNOSIS — I5181 Takotsubo syndrome: Secondary | ICD-10-CM | POA: Insufficient documentation

## 2019-07-01 NOTE — Patient Instructions (Signed)
Medication Instructions:  Your physician recommends that you continue on your current medications as directed. Please refer to the Current Medication list given to you today.  *If you need a refill on your cardiac medications before your next appointment, please call your pharmacy*  Lab Work: Your physician recommends that you return for lab work today: BMET, BNP  If you have labs (blood work) drawn today and your tests are completely normal, you will receive your results only by: Marland Kitchen MyChart Message (if you have MyChart) OR . A paper copy in the mail If you have any lab test that is abnormal or we need to change your treatment, we will call you to review the results.  Testing/Procedures:  Move echo up to before 4/14  Follow-Up: At [facility], you and your health needs are our priority.  As part of our continuing mission to provide you with exceptional heart care, we have created designated Provider Care Teams.  These Care Teams include your primary Cardiologist (physician) and Advanced Practice Providers (APPs -  Physician Assistants and Nurse Practitioners) who all work together to provide you with the care you need, when you need it.  Your next appointment:   Please keep your follow-up appointment with Dr. Merlene Morse on 09/15/19.

## 2019-07-02 ENCOUNTER — Telehealth: Payer: Self-pay | Admitting: Orthopaedic Surgery

## 2019-07-02 LAB — BASIC METABOLIC PANEL
BUN/Creatinine Ratio: 29 — ABNORMAL HIGH (ref 12–28)
BUN: 23 mg/dL (ref 8–27)
CO2: 25 mmol/L (ref 20–29)
Calcium: 8.2 mg/dL — ABNORMAL LOW (ref 8.7–10.3)
Chloride: 98 mmol/L (ref 96–106)
Creatinine, Ser: 0.78 mg/dL (ref 0.57–1.00)
GFR calc Af Amer: 81 mL/min/{1.73_m2} (ref >59)
GFR calc non Af Amer: 70 mL/min/{1.73_m2} (ref >59)
Glucose: 115 mg/dL — ABNORMAL HIGH (ref 65–99)
Potassium: 3.7 mmol/L (ref 3.5–5.2)
Sodium: 134 mmol/L (ref 134–144)

## 2019-07-02 LAB — BRAIN NATRIURETIC PEPTIDE: BNP: 147.1 pg/mL — ABNORMAL HIGH (ref 0.0–100.0)

## 2019-07-02 NOTE — Telephone Encounter (Signed)
Patient's home PT [name] called. She would like verbal orders to resume PT 06/30/2019. Strength, gait training, endurence for 1wk 1x, 2wk 2x, 1wk 1x. Her call back number is [phone]

## 2019-07-02 NOTE — Telephone Encounter (Signed)
Called to approve orders 

## 2019-07-06 ENCOUNTER — Encounter: Payer: Self-pay | Admitting: Orthopaedic Surgery

## 2019-07-06 ENCOUNTER — Ambulatory Visit (INDEPENDENT_AMBULATORY_CARE_PROVIDER_SITE_OTHER): Payer: Medicare PPO | Admitting: Orthopaedic Surgery

## 2019-07-06 ENCOUNTER — Ambulatory Visit (INDEPENDENT_AMBULATORY_CARE_PROVIDER_SITE_OTHER): Payer: Medicare PPO

## 2019-07-06 ENCOUNTER — Other Ambulatory Visit: Payer: Self-pay

## 2019-07-06 DIAGNOSIS — S32471A Displaced fracture of medial wall of right acetabulum, initial encounter for closed fracture: Secondary | ICD-10-CM | POA: Diagnosis not present

## 2019-07-06 NOTE — Progress Notes (Signed)
Office Visit Note   Patient: [name]           Date of Birth: 1933-05-28           MRN: [id number] Visit Date: 07/06/2019              Requested by: [name], [place],  [place] [id number] PCP: [name], MD   Assessment & Plan: Visit Diagnoses:  1. Closed displaced fracture of medial wall of right acetabulum, initial encounter [facility])     Plan: At this point she has demonstrated fracture healing.  She may increase activity as tolerated.  Follow-up as needed.  Follow-Up Instructions: Return if symptoms worsen or fail to improve.   Orders:  Orders Placed This Encounter  Procedures  . XR HIP UNILAT W OR W/O PELVIS 2-3 VIEWS RIGHT   No orders of the defined types were placed in this encounter.     Procedures: No procedures performed   Clinical Data: No additional findings.   Subjective: Chief Complaint  Patient presents with  . Right Hip - Pain, Follow-up    [name] returns today for fracture follow-up.  She is doing well overall and reports no pain.   Review of Systems   Objective: Vital Signs: There were no vitals taken for this visit.  Physical Exam  Ortho Exam She is able to walk without any assistive devices.  She is able to stand on the right leg without pain. Specialty Comments:  No specialty comments available.  Imaging: XR HIP UNILAT W OR W/O PELVIS 2-3 VIEWS RIGHT  Result Date: 07/06/2019 Healed acetabular and pubic rami fractures.  There is abundant callus formation.    PMFS History: Patient Active Problem List   Diagnosis Date Noted  . Chronic systolic heart failure ([place]) 07/01/2019  . Takotsubo cardiomyopathy 07/01/2019  . Elevated troponin   . ACS (acute coronary syndrome) ([place]) 06/23/2019  . COVID-19 virus detected 06/23/2019  . Acetabular fracture ([place]) 04/14/2019  . 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 Port-A-Cath in place 12/10/2017  . Goals of care, counseling/discussion 09/10/2017    . Encounter for antineoplastic immunotherapy 09/10/2017  . Bronchitis 08/16/2016  . Radiation pneumonitis ([place]) 01/24/2016  . Encounter for antineoplastic chemotherapy 12/14/2014  . Bilateral lung cancer ([place]) 04/08/2011   Past Medical History:  Diagnosis Date  . Atrial fibrillation ([place])   . FHx: chemotherapy 2008&2011   4 cycles cisplatin,taxotere,2008& carboplatin and Alimta 2011  . History of migraine headaches   . Hypercholesterolemia   . lung ca dx'd 06/2006   rt and lt lung  . Lung cancer ([place]) 3/14/1   right- Adenocarcinoma w/bronchioalveolar features  . Radiation 11/01/15-11/14/15   right upper lobe 30 gray  . Radiation pneumonitis ([place]) 01/24/2016    Family History  Problem Relation Age of Onset  . Hypertension Father     Past Surgical History:  Procedure Laterality Date  . CATARACT EXTRACTION  2013   bilateral  . EMBOLECTOMY Right 03/13/2019   Procedure: RIGHT BRACHIAL, RADIAL, ULNAR EMBOLECTOMY;  Surgeon: Elam Dutch, MD;  Location: [facility] OR;  Service: Vascular;  Laterality: Right;  . left  lowerlung lobectomy Left 06/19/2006   Dr.Burney,Left lower lobectomy  . LEFT HEART CATH AND CORONARY ANGIOGRAPHY N/A 06/25/2019   Procedure: LEFT HEART CATH AND CORONARY ANGIOGRAPHY;  Surgeon: Martinique, Peter M, MD;  Location: [facility];  Service: Cardiovascular;  Laterality: N/A;  . MYRINGOTOMY Bilateral 06/25/2016  . Porta-cath  2011  Social History   Occupational History  . Not on file  Tobacco Use  . Smoking status: Never Smoker  . Smokeless tobacco: Never Used  Substance and Sexual Activity  . Alcohol use: No  . Drug use: No  . Sexual activity: Not Currently

## 2019-07-08 ENCOUNTER — Other Ambulatory Visit: Payer: Self-pay | Admitting: Cardiovascular Disease

## 2019-07-08 NOTE — Telephone Encounter (Signed)
Rx has been sent to the pharmacy electronically. ° °

## 2019-07-09 MED ORDER — METOPROLOL SUCCINATE ER 50 MG PO TB24: 50.0000 mg | ORAL_TABLET | Freq: Every day | ORAL | 3 refills | Status: DC

## 2019-07-12 NOTE — Telephone Encounter (Signed)
It is definitely likely that the dizziness is because of the multiple medications.  Please reduce the metoprolol succinate to 25 mg once daily (only a half tablet daily).  The tablet is scored and can be cut in half. If this is insufficient to alleviate the dizziness, we may decide to wean that down further or even stop the metoprolol.

## 2019-07-13 ENCOUNTER — Other Ambulatory Visit: Payer: Self-pay | Admitting: *Deleted

## 2019-07-13 MED ORDER — METOPROLOL SUCCINATE ER 25 MG PO TB24: 25.0000 mg | ORAL_TABLET | Freq: Every day | ORAL | Status: DC

## 2019-07-14 ENCOUNTER — Emergency Department (HOSPITAL_COMMUNITY): Payer: Medicare PPO

## 2019-07-14 ENCOUNTER — Inpatient Hospital Stay (HOSPITAL_COMMUNITY)
Admission: EM | Admit: 2019-07-14 | Discharge: 2019-07-19 | DRG: 180 | Disposition: A | Discharge status: Home Health | Payer: Medicare PPO | Attending: Internal Medicine | Admitting: Internal Medicine

## 2019-07-14 ENCOUNTER — Other Ambulatory Visit: Payer: Self-pay

## 2019-07-14 ENCOUNTER — Encounter (HOSPITAL_COMMUNITY): Payer: Self-pay | Admitting: Emergency Medicine

## 2019-07-14 DIAGNOSIS — R531 Weakness: Secondary | ICD-10-CM

## 2019-07-14 DIAGNOSIS — I5181 Takotsubo syndrome: Secondary | ICD-10-CM | POA: Diagnosis present

## 2019-07-14 DIAGNOSIS — Z888 Allergy status to other drugs, medicaments and biological substances status: Secondary | ICD-10-CM

## 2019-07-14 DIAGNOSIS — E43 Unspecified severe protein-calorie malnutrition: Secondary | ICD-10-CM | POA: Insufficient documentation

## 2019-07-14 DIAGNOSIS — J9620 Acute and chronic respiratory failure, unspecified whether with hypoxia or hypercapnia: Secondary | ICD-10-CM

## 2019-07-14 DIAGNOSIS — Z7901 Long term (current) use of anticoagulants: Secondary | ICD-10-CM

## 2019-07-14 DIAGNOSIS — C3411 Malignant neoplasm of upper lobe, right bronchus or lung: Principal | ICD-10-CM | POA: Diagnosis present

## 2019-07-14 DIAGNOSIS — Z923 Personal history of irradiation: Secondary | ICD-10-CM

## 2019-07-14 DIAGNOSIS — Z9221 Personal history of antineoplastic chemotherapy: Secondary | ICD-10-CM

## 2019-07-14 DIAGNOSIS — J441 Chronic obstructive pulmonary disease with (acute) exacerbation: Secondary | ICD-10-CM | POA: Diagnosis present

## 2019-07-14 DIAGNOSIS — R64 Cachexia: Secondary | ICD-10-CM | POA: Diagnosis present

## 2019-07-14 DIAGNOSIS — I5022 Chronic systolic (congestive) heart failure: Secondary | ICD-10-CM

## 2019-07-14 DIAGNOSIS — Z681 Body mass index (BMI) 19 or less, adult: Secondary | ICD-10-CM

## 2019-07-14 DIAGNOSIS — E871 Hypo-osmolality and hyponatremia: Secondary | ICD-10-CM | POA: Diagnosis not present

## 2019-07-14 DIAGNOSIS — Z8249 Family history of ischemic heart disease and other diseases of the circulatory system: Secondary | ICD-10-CM

## 2019-07-14 DIAGNOSIS — I4719 Other supraventricular tachycardia: Secondary | ICD-10-CM

## 2019-07-14 DIAGNOSIS — Z515 Encounter for palliative care: Secondary | ICD-10-CM

## 2019-07-14 DIAGNOSIS — I471 Supraventricular tachycardia: Secondary | ICD-10-CM | POA: Diagnosis present

## 2019-07-14 DIAGNOSIS — I480 Paroxysmal atrial fibrillation: Secondary | ICD-10-CM | POA: Diagnosis present

## 2019-07-14 DIAGNOSIS — C3491 Malignant neoplasm of unspecified part of right bronchus or lung: Secondary | ICD-10-CM | POA: Diagnosis present

## 2019-07-14 DIAGNOSIS — R410 Disorientation, unspecified: Secondary | ICD-10-CM | POA: Diagnosis not present

## 2019-07-14 DIAGNOSIS — Z66 Do not resuscitate: Secondary | ICD-10-CM | POA: Diagnosis present

## 2019-07-14 DIAGNOSIS — Z8616 Personal history of COVID-19: Secondary | ICD-10-CM

## 2019-07-14 DIAGNOSIS — R269 Unspecified abnormalities of gait and mobility: Secondary | ICD-10-CM | POA: Diagnosis present

## 2019-07-14 DIAGNOSIS — R0602 Shortness of breath: Secondary | ICD-10-CM | POA: Diagnosis not present

## 2019-07-14 DIAGNOSIS — Z79899 Other long term (current) drug therapy: Secondary | ICD-10-CM

## 2019-07-14 DIAGNOSIS — E7800 Pure hypercholesterolemia, unspecified: Secondary | ICD-10-CM | POA: Diagnosis present

## 2019-07-14 DIAGNOSIS — J9621 Acute and chronic respiratory failure with hypoxia: Secondary | ICD-10-CM | POA: Diagnosis present

## 2019-07-14 DIAGNOSIS — I5042 Chronic combined systolic (congestive) and diastolic (congestive) heart failure: Secondary | ICD-10-CM | POA: Diagnosis present

## 2019-07-14 DIAGNOSIS — R54 Age-related physical debility: Secondary | ICD-10-CM | POA: Diagnosis present

## 2019-07-14 DIAGNOSIS — Z20822 Contact with and (suspected) exposure to covid-19: Secondary | ICD-10-CM | POA: Diagnosis present

## 2019-07-14 LAB — CBC WITH DIFFERENTIAL/PLATELET
Abs Immature Granulocytes: 0.03 10*3/uL (ref 0.00–0.07)
Basophils Absolute: 0 10*3/uL (ref 0.0–0.1)
Basophils Relative: 0 %
Eosinophils Absolute: 0 10*3/uL (ref 0.0–0.5)
Eosinophils Relative: 0 %
HCT: 37.1 % (ref 36.0–46.0)
Hemoglobin: 12.6 g/dL (ref 12.0–15.0)
Immature Granulocytes: 0 %
Lymphocytes Relative: 7 %
Lymphs Abs: 0.6 10*3/uL — ABNORMAL LOW (ref 0.7–4.0)
MCH: 32.9 pg (ref 26.0–34.0)
MCHC: 34 g/dL (ref 30.0–36.0)
MCV: 96.9 fL (ref 80.0–100.0)
Monocytes Absolute: 0.8 10*3/uL (ref 0.1–1.0)
Monocytes Relative: 8 %
Neutro Abs: 7.9 10*3/uL — ABNORMAL HIGH (ref 1.7–7.7)
Neutrophils Relative %: 85 %
Platelets: 336 10*3/uL (ref 150–400)
RBC: 3.83 MIL/uL — ABNORMAL LOW (ref 3.87–5.11)
RDW: 16.3 % — ABNORMAL HIGH (ref 11.5–15.5)
WBC: 9.3 10*3/uL (ref 4.0–10.5)
nRBC: 0 % (ref 0.0–0.2)

## 2019-07-14 LAB — COMPREHENSIVE METABOLIC PANEL
ALT: 31 U/L (ref 0–44)
AST: 34 U/L (ref 15–41)
Albumin: 2.4 g/dL — ABNORMAL LOW (ref 3.5–5.0)
Alkaline Phosphatase: 126 U/L (ref 38–126)
Anion gap: 14 (ref 5–15)
BUN: 14 mg/dL (ref 8–23)
CO2: 26 mmol/L (ref 22–32)
Calcium: 8 mg/dL — ABNORMAL LOW (ref 8.9–10.3)
Chloride: 85 mmol/L — ABNORMAL LOW (ref 98–111)
Creatinine, Ser: 0.6 mg/dL (ref 0.44–1.00)
GFR calc Af Amer: >60 mL/min (ref >60)
GFR calc non Af Amer: >60 mL/min (ref >60)
Glucose, Bld: 183 mg/dL — ABNORMAL HIGH (ref 70–99)
Potassium: 2.7 mmol/L — CL (ref 3.5–5.1)
Sodium: 125 mmol/L — ABNORMAL LOW (ref 135–145)
Total Bilirubin: 0.8 mg/dL (ref 0.3–1.2)
Total Protein: 6.7 g/dL (ref 6.5–8.1)

## 2019-07-14 LAB — PROTIME-INR
INR: 1.2 (ref 0.8–1.2)
Prothrombin Time: 15 seconds (ref 11.4–15.2)

## 2019-07-14 LAB — TROPONIN I (HIGH SENSITIVITY): Troponin I (High Sensitivity): 42 ng/L — ABNORMAL HIGH (ref <18)

## 2019-07-14 MED ORDER — AMIODARONE HCL IN DEXTROSE 360-4.14 MG/200ML-% IV SOLN: 30.0000 mg/h | INTRAVENOUS | Status: DC

## 2019-07-14 MED ORDER — AMIODARONE HCL IN DEXTROSE 360-4.14 MG/200ML-% IV SOLN
60.0000 mg/h | INTRAVENOUS | Status: DC
  Administered 2019-07-14: 60 mg/h via INTRAVENOUS
  Filled 2019-07-14: qty 200

## 2019-07-14 MED ORDER — POTASSIUM CHLORIDE 10 MEQ/100ML IV SOLN
10.0000 meq | Freq: Once | INTRAVENOUS | Status: AC
  Administered 2019-07-14: 10 meq via INTRAVENOUS
  Filled 2019-07-14: qty 100

## 2019-07-14 NOTE — ED Provider Notes (Signed)
[facility] EMERGENCY DEPARTMENT Provider Note   CSN: [id number] Arrival date & time: 07/14/19  2115     History Chief Complaint  Patient presents with  . Shortness of Breath    [name] is a 86 y.o. female.  HPI Patient reports that she was on her way to the bathroom this evening she became extremely short of breath.  She reports that she was gasping and could not catch her breath.  Her daughter checked her oxygen saturation report it was in the 76s.  At that time they called EMS.  Patient reports she has felt better after they administered oxygen.  He denies she ever had any chest pain or palpitations.  He denies any increased swelling or pain in her legs.  Patient has severe comorbid medical illnesses including history of lung cancer currently treated with immunotherapy but historically has had radiation and other chemotherapy treatments.  She was recently admitted to the heart 1\23\2012 for dyspnea and significant elevation of cardiac enzymes with diagnosis of possible Takotsubo cardiomyopathy with CHF and rapid A. fib.  She was discharged home without oxygen with oxygen saturations at 92% with ambulation.  She reports she was doing relatively well until this episode tonight.    Past Medical History:  Diagnosis Date  . Atrial fibrillation ([place])   . FHx: chemotherapy 2008&2011   4 cycles cisplatin,taxotere,2008& carboplatin and Alimta 2011  . History of migraine headaches   . Hypercholesterolemia   . lung ca dx'd 06/2006   rt and lt lung  . Lung cancer ([place]) 3/14/1   right- Adenocarcinoma w/bronchioalveolar features  . Radiation 11/01/15-11/14/15   right upper lobe 30 gray  . Radiation pneumonitis ([place]) 01/24/2016    Patient Active Problem List   Diagnosis Date Noted  . Chronic systolic heart failure ([place]) 07/01/2019  . Takotsubo cardiomyopathy 07/01/2019  . Elevated troponin   . ACS (acute coronary syndrome) ([place]) 06/23/2019  . COVID-19 virus  detected 06/23/2019  . Acetabular fracture ([place]) 04/14/2019  . 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 Port-A-Cath in place 12/10/2017  . Goals of care, counseling/discussion 09/10/2017  . Encounter for antineoplastic immunotherapy 09/10/2017  . Bronchitis 08/16/2016  . Radiation pneumonitis ([place]) 01/24/2016  . Encounter for antineoplastic chemotherapy 12/14/2014  . Bilateral lung cancer ([place]) 04/08/2011    Past Surgical History:  Procedure Laterality Date  . CATARACT EXTRACTION  2013   bilateral  . EMBOLECTOMY Right 03/13/2019   Procedure: RIGHT BRACHIAL, RADIAL, ULNAR EMBOLECTOMY;  Surgeon: Elam Dutch, MD;  Location: [facility] OR;  Service: Vascular;  Laterality: Right;  . left  lowerlung lobectomy Left 06/19/2006   Dr.Burney,Left lower lobectomy  . LEFT HEART CATH AND CORONARY ANGIOGRAPHY N/A 06/25/2019   Procedure: LEFT HEART CATH AND CORONARY ANGIOGRAPHY;  Surgeon: Martinique, Peter M, MD;  Location: [place];  Service: Cardiovascular;  Laterality: N/A;  . MYRINGOTOMY Bilateral 06/25/2016  . Porta-cath  2011     OB History   No obstetric history on file.     Family History  Problem Relation Age of Onset  . Hypertension Father     Social History   Tobacco Use  . Smoking status: Never Smoker  . Smokeless tobacco: Never Used  Substance Use Topics  . Alcohol use: No  . Drug use: No    Home Medications Prior to Admission medications   Medication Sig Start Date End Date Taking? Authorizing Provider  albuterol (VENTOLIN HFA) 108 (90 Base) MCG/ACT inhaler Inhale 2  puffs into the lungs every 6 (six) hours as needed for wheezing or shortness of breath.    [provider]  amiodarone (PACERONE) 200 MG tablet TAKE 1 TABLET BY MOUTH EVERY DAY 07/08/19   Lorretta Harp, MD  apixaban (ELIQUIS) 2.5 MG TABS tablet Take 1 tablet (2.5 mg total) by mouth 2 (two) times daily. 03/16/19   Ulyses Amor, [name]  furosemide (LASIX) 20 MG tablet Take 1 tablet (20 mg  total) by mouth daily. 06/25/19   Kathie Dike, MD  Ipratropium-Albuterol (COMBIVENT RESPIMAT) 20-100 MCG/ACT AERS respimat Inhale 1 puff into the lungs every 6 (six) hours.    [provider]  metoprolol succinate (TOPROL-XL) 25 MG 24 hr tablet Take 1 tablet (25 mg total) by mouth daily. Take with or immediately following a meal.  Refills per cardiology or primary care 07/13/19   Croitoru, Mihai, MD  Multiple Vitamin (MULTIVITAMIN WITH MINERALS) TABS tablet Take 1 tablet by mouth daily.    [provider]  vitamin C (ASCORBIC ACID) 500 MG tablet Take 500 mg by mouth daily.    [provider]    Allergies    Simvastatin  Review of Systems   Review of Systems 10 Systems reviewed and are negative for acute change except as noted in the HPI.  Physical Exam Updated Vital Signs BP 140/81 (BP Location: Right Arm)   Pulse 72   Temp 98.8 F (37.1 C)   Resp (!) 26   Ht 5\' 2"  (1.575 m)   Wt 45.4 kg   SpO2 96%   BMI 18.29 kg/m   Physical Exam Constitutional:      Comments: Patient is alert and nontoxic.  She does have moderate increased work of breathing at rest.  She does appear significantly deconditioned and frail.  HENT:     Head: Normocephalic and atraumatic.  Eyes:     Extraocular Movements: Extraocular movements intact.  Cardiovascular:     Rate and Rhythm: Normal rate and regular rhythm.  Pulmonary:     Comments: Patient has very soft breath sounds on the right and scattered coarse expiratory wheeze. Abdominal:     General: There is no distension.     Palpations: Abdomen is soft.     Tenderness: There is no abdominal tenderness. There is no guarding.  Musculoskeletal:        General: No swelling or tenderness. Normal range of motion.  Skin:    General: Skin is warm and dry.  Neurological:     General: No focal deficit present.     Mental Status: She is oriented to person, place, and time.     Coordination: Coordination normal.  Psychiatric:         Mood and Affect: Mood normal.     ED Results / Procedures / Treatments   Labs (all labs ordered are listed, but only abnormal results are displayed) Labs Reviewed  COMPREHENSIVE METABOLIC PANEL  BRAIN NATRIURETIC PEPTIDE  CBC WITH DIFFERENTIAL/PLATELET  PROTIME-INR  URINALYSIS, ROUTINE W REFLEX MICROSCOPIC  TROPONIN I (HIGH SENSITIVITY)    EKG EKG Interpretation  Date/Time:  Wednesday July 14 2019 21:45:22 EST Ventricular Rate:  66 PR Interval:    QRS Duration: 96 QT Interval:  578 QTC Calculation: 606 R Axis:   -53 Text Interpretation: Sinus rhythm Left anterior fascicular block Left ventricular hypertrophy ST elevation, consider inferior injury Prolonged QT interval similar to older tracing Confirmed by Charlesetta Shanks [phone]) on 07/14/2019 9:59:05 PM   Radiology No results found.  Procedures Procedures (including critical care time) CRITICAL CARE Performed by: Charlesetta Shanks   Total critical care time: 45 minutes  Critical care time was exclusive of separately billable procedures and treating other patients.  Critical care was necessary to treat or prevent imminent or life-threatening deterioration.  Critical care was time spent personally by me on the following activities: development of treatment plan with patient and/or surrogate as well as nursing, discussions with consultants, evaluation of patient's response to treatment, examination of patient, obtaining history from patient or surrogate, ordering and performing treatments and interventions, ordering and review of laboratory studies, ordering and review of radiographic studies, pulse oximetry and re-evaluation of patient's condition. Medications Ordered in ED Medications - No data to display  ED Course  I have reviewed the triage vital signs and the nursing notes.  Pertinent labs & imaging results that were available during my care of the patient were reviewed by me and considered in my medical  decision making (see chart for details).    MDM Rules/Calculators/A&P                       Case reviewed with patient's daughter listed in contact information.  Consult: Reviewed with [facility] hospitalist Dr. Alma Friendly for admission.  Consult: Cardiology Dr. Jonne Ply has reviewed rhythm strips and prior history.  At this time, he suspects these are episodes of either paroxysmal rapid atrial fib or paroxysmal atrial tachycardia.  These should be fairly self-limited.  Increasing patient's amiodarone dose from her 200 mg every other day to 200 mg daily.  Patient has severe medical comorbid illness with paroxysmal atrial fibrillation and bilateral lung cancer with history of radiation and on chemotherapy.  Patient did have positive Covid testing on 12\31\2020.  Reportedly she remained essentially asymptomatic.  Patient reports this evening she became extremely short of breath.  She reports her oxygen saturation per her daughter was in the 91s.  She denies she had chest pain.  On arrival, patient felt improved.  She felt that she was at her baseline for breathing.  Plan at this time will be for overnight observation and monitoring.  Patient does not have fever.  Chest x-ray shows changes consistent with prior chest x-rays.  At this time I do not feel that empiric antibiotics are indicated.  She had prior positive Covid testing within the past 2 weeks and anticipate that repeat testing will be likely positive as well.  She has not developed fever or constitutional symptoms to suggest that the etiology of this is necessarily worsening case of Covid.   Final Clinical Impression(s) / ED Diagnoses Final diagnoses:  None    Rx / DC Orders ED Discharge Orders    None       Charlesetta Shanks, MD 07/15/19 [phone]

## 2019-07-14 NOTE — ED Triage Notes (Signed)
Pt sat at 86% on RA. Placed on 3L [place], resulted in 97% sat.

## 2019-07-14 NOTE — ED Notes (Signed)
Pt demonstrated several runs of vtach on the monitor, had an 8 beat run. Strip printed and given to MD pfeiffer. Pt denies change in symptoms.

## 2019-07-14 NOTE — ED Triage Notes (Signed)
Pt arrived via [facility] from home c/o shob w/ n/v. Pt actively vomiting on EMS arrival. Pt stated N/V started several days ago and shob began today, pt also stated that she has lung cancer.

## 2019-07-14 NOTE — ED Notes (Signed)
Patient placed on pure wick °

## 2019-07-14 NOTE — ED Notes (Signed)
Pt's daughter, Terri Piedra, [phone], call for updates.

## 2019-07-15 ENCOUNTER — Encounter (HOSPITAL_COMMUNITY): Payer: Self-pay | Admitting: Internal Medicine

## 2019-07-15 DIAGNOSIS — E43 Unspecified severe protein-calorie malnutrition: Secondary | ICD-10-CM | POA: Diagnosis present

## 2019-07-15 DIAGNOSIS — Z79899 Other long term (current) drug therapy: Secondary | ICD-10-CM | POA: Diagnosis not present

## 2019-07-15 DIAGNOSIS — Z888 Allergy status to other drugs, medicaments and biological substances status: Secondary | ICD-10-CM | POA: Diagnosis not present

## 2019-07-15 DIAGNOSIS — R64 Cachexia: Secondary | ICD-10-CM | POA: Diagnosis present

## 2019-07-15 DIAGNOSIS — I5042 Chronic combined systolic (congestive) and diastolic (congestive) heart failure: Secondary | ICD-10-CM | POA: Diagnosis present

## 2019-07-15 DIAGNOSIS — Z515 Encounter for palliative care: Secondary | ICD-10-CM | POA: Diagnosis not present

## 2019-07-15 DIAGNOSIS — Z7189 Other specified counseling: Secondary | ICD-10-CM | POA: Diagnosis not present

## 2019-07-15 DIAGNOSIS — E871 Hypo-osmolality and hyponatremia: Secondary | ICD-10-CM | POA: Diagnosis not present

## 2019-07-15 DIAGNOSIS — E7800 Pure hypercholesterolemia, unspecified: Secondary | ICD-10-CM | POA: Diagnosis present

## 2019-07-15 DIAGNOSIS — Z681 Body mass index (BMI) 19 or less, adult: Secondary | ICD-10-CM | POA: Diagnosis not present

## 2019-07-15 DIAGNOSIS — C3491 Malignant neoplasm of unspecified part of right bronchus or lung: Secondary | ICD-10-CM

## 2019-07-15 DIAGNOSIS — C3492 Malignant neoplasm of unspecified part of left bronchus or lung: Secondary | ICD-10-CM

## 2019-07-15 DIAGNOSIS — Z20822 Contact with and (suspected) exposure to covid-19: Secondary | ICD-10-CM | POA: Diagnosis present

## 2019-07-15 DIAGNOSIS — I5021 Acute systolic (congestive) heart failure: Secondary | ICD-10-CM | POA: Diagnosis not present

## 2019-07-15 DIAGNOSIS — J9620 Acute and chronic respiratory failure, unspecified whether with hypoxia or hypercapnia: Secondary | ICD-10-CM | POA: Diagnosis not present

## 2019-07-15 DIAGNOSIS — J441 Chronic obstructive pulmonary disease with (acute) exacerbation: Secondary | ICD-10-CM | POA: Diagnosis present

## 2019-07-15 DIAGNOSIS — J9621 Acute and chronic respiratory failure with hypoxia: Secondary | ICD-10-CM | POA: Diagnosis present

## 2019-07-15 DIAGNOSIS — Z9221 Personal history of antineoplastic chemotherapy: Secondary | ICD-10-CM | POA: Diagnosis not present

## 2019-07-15 DIAGNOSIS — R0602 Shortness of breath: Secondary | ICD-10-CM | POA: Diagnosis present

## 2019-07-15 DIAGNOSIS — Z7901 Long term (current) use of anticoagulants: Secondary | ICD-10-CM | POA: Diagnosis not present

## 2019-07-15 DIAGNOSIS — I471 Supraventricular tachycardia: Secondary | ICD-10-CM

## 2019-07-15 DIAGNOSIS — R54 Age-related physical debility: Secondary | ICD-10-CM | POA: Diagnosis present

## 2019-07-15 DIAGNOSIS — I5022 Chronic systolic (congestive) heart failure: Secondary | ICD-10-CM

## 2019-07-15 DIAGNOSIS — C3411 Malignant neoplasm of upper lobe, right bronchus or lung: Secondary | ICD-10-CM | POA: Diagnosis present

## 2019-07-15 DIAGNOSIS — J9601 Acute respiratory failure with hypoxia: Secondary | ICD-10-CM | POA: Diagnosis not present

## 2019-07-15 DIAGNOSIS — Z66 Do not resuscitate: Secondary | ICD-10-CM | POA: Diagnosis present

## 2019-07-15 DIAGNOSIS — I480 Paroxysmal atrial fibrillation: Secondary | ICD-10-CM | POA: Diagnosis present

## 2019-07-15 DIAGNOSIS — Z8249 Family history of ischemic heart disease and other diseases of the circulatory system: Secondary | ICD-10-CM | POA: Diagnosis not present

## 2019-07-15 DIAGNOSIS — Z923 Personal history of irradiation: Secondary | ICD-10-CM | POA: Diagnosis not present

## 2019-07-15 DIAGNOSIS — R410 Disorientation, unspecified: Secondary | ICD-10-CM | POA: Diagnosis not present

## 2019-07-15 DIAGNOSIS — R269 Unspecified abnormalities of gait and mobility: Secondary | ICD-10-CM | POA: Diagnosis present

## 2019-07-15 DIAGNOSIS — I5181 Takotsubo syndrome: Secondary | ICD-10-CM | POA: Diagnosis present

## 2019-07-15 LAB — URINALYSIS, ROUTINE W REFLEX MICROSCOPIC
Bilirubin Urine: NEGATIVE
Glucose, UA: 50 mg/dL — AB
Hgb urine dipstick: NEGATIVE
Ketones, ur: NEGATIVE mg/dL
Nitrite: NEGATIVE
Protein, ur: 30 mg/dL — AB
Specific Gravity, Urine: 1.012 (ref 1.005–1.030)
pH: 6 (ref 5.0–8.0)

## 2019-07-15 LAB — BRAIN NATRIURETIC PEPTIDE: B Natriuretic Peptide: 452.2 pg/mL — ABNORMAL HIGH (ref 0.0–100.0)

## 2019-07-15 LAB — SARS CORONAVIRUS 2 (TAT 6-24 HRS): SARS Coronavirus 2: NEGATIVE

## 2019-07-15 LAB — TROPONIN I (HIGH SENSITIVITY): Troponin I (High Sensitivity): 231 ng/L (ref <18)

## 2019-07-15 MED ORDER — FUROSEMIDE 20 MG PO TABS: 20.0000 mg | ORAL_TABLET | Freq: Every day | ORAL | Status: DC

## 2019-07-15 MED ORDER — POTASSIUM CHLORIDE CRYS ER 20 MEQ PO TBCR
40.0000 meq | EXTENDED_RELEASE_TABLET | ORAL | Status: AC
  Administered 2019-07-15 (×4): 40 meq via ORAL
  Filled 2019-07-15 (×4): qty 2

## 2019-07-15 MED ORDER — ACETAMINOPHEN 650 MG RE SUPP: 650.0000 mg | Freq: Four times a day (QID) | RECTAL | Status: DC | PRN

## 2019-07-15 MED ORDER — ADULT MULTIVITAMIN W/MINERALS CH
1.0000 | ORAL_TABLET | Freq: Every day | ORAL | Status: DC
  Administered 2019-07-15 – 2019-07-19 (×5): 1 via ORAL
  Filled 2019-07-15 (×5): qty 1

## 2019-07-15 MED ORDER — SODIUM CHLORIDE 0.9% FLUSH
3.0000 mL | Freq: Two times a day (BID) | INTRAVENOUS | Status: DC
  Administered 2019-07-15 – 2019-07-18 (×9): 3 mL via INTRAVENOUS

## 2019-07-15 MED ORDER — POTASSIUM CHLORIDE CRYS ER 20 MEQ PO TBCR
40.0000 meq | EXTENDED_RELEASE_TABLET | Freq: Once | ORAL | Status: AC
  Administered 2019-07-15: 40 meq via ORAL
  Filled 2019-07-15: qty 2

## 2019-07-15 MED ORDER — SODIUM CHLORIDE 0.9% FLUSH: 3.0000 mL | INTRAVENOUS | Status: DC | PRN

## 2019-07-15 MED ORDER — IPRATROPIUM-ALBUTEROL 0.5-2.5 (3) MG/3ML IN SOLN
3.0000 mL | Freq: Two times a day (BID) | RESPIRATORY_TRACT | Status: DC
  Administered 2019-07-15 – 2019-07-17 (×5): 3 mL via RESPIRATORY_TRACT
  Filled 2019-07-15 (×6): qty 3

## 2019-07-15 MED ORDER — ENSURE ENLIVE PO LIQD
237.0000 mL | Freq: Two times a day (BID) | ORAL | Status: DC
  Administered 2019-07-15 – 2019-07-19 (×6): 237 mL via ORAL

## 2019-07-15 MED ORDER — FUROSEMIDE 10 MG/ML IJ SOLN
40.0000 mg | Freq: Once | INTRAMUSCULAR | Status: AC
  Administered 2019-07-15: 04:00:00 40 mg via INTRAVENOUS
  Filled 2019-07-15: qty 4

## 2019-07-15 MED ORDER — AMIODARONE HCL IN DEXTROSE 360-4.14 MG/200ML-% IV SOLN
INTRAVENOUS | Status: AC
  Filled 2019-07-15: qty 200

## 2019-07-15 MED ORDER — TRAMADOL HCL 50 MG PO TABS: 50.0000 mg | ORAL_TABLET | Freq: Four times a day (QID) | ORAL | Status: DC | PRN

## 2019-07-15 MED ORDER — METOPROLOL SUCCINATE ER 25 MG PO TB24
25.0000 mg | ORAL_TABLET | Freq: Every day | ORAL | Status: DC
  Administered 2019-07-16 – 2019-07-19 (×4): 25 mg via ORAL
  Filled 2019-07-15 (×5): qty 1

## 2019-07-15 MED ORDER — SERTRALINE HCL 50 MG PO TABS
25.0000 mg | ORAL_TABLET | Freq: Every day | ORAL | Status: DC
  Administered 2019-07-15: 09:00:00 25 mg via ORAL
  Filled 2019-07-15 (×2): qty 1

## 2019-07-15 MED ORDER — ALBUTEROL SULFATE (2.5 MG/3ML) 0.083% IN NEBU
2.5000 mg | INHALATION_SOLUTION | Freq: Four times a day (QID) | RESPIRATORY_TRACT | Status: DC | PRN
  Administered 2019-07-16: 2.5 mg via RESPIRATORY_TRACT
  Filled 2019-07-15: qty 3

## 2019-07-15 MED ORDER — IPRATROPIUM-ALBUTEROL 0.5-2.5 (3) MG/3ML IN SOLN
3.0000 mL | Freq: Four times a day (QID) | RESPIRATORY_TRACT | Status: DC
  Administered 2019-07-15 (×2): 3 mL via RESPIRATORY_TRACT
  Filled 2019-07-15 (×2): qty 3

## 2019-07-15 MED ORDER — CHLORHEXIDINE GLUCONATE CLOTH 2 % EX PADS
6.0000 | MEDICATED_PAD | Freq: Every day | CUTANEOUS | Status: DC
  Administered 2019-07-15 – 2019-07-16 (×2): 6 via TOPICAL

## 2019-07-15 MED ORDER — FUROSEMIDE 10 MG/ML IJ SOLN
40.0000 mg | Freq: Every day | INTRAMUSCULAR | Status: DC
  Administered 2019-07-15 – 2019-07-18 (×5): 40 mg via INTRAVENOUS
  Filled 2019-07-15 (×5): qty 4

## 2019-07-15 MED ORDER — AMIODARONE HCL 200 MG PO TABS
200.0000 mg | ORAL_TABLET | Freq: Every day | ORAL | Status: DC
  Administered 2019-07-15 – 2019-07-18 (×4): 200 mg via ORAL
  Filled 2019-07-15 (×4): qty 1

## 2019-07-15 MED ORDER — SODIUM CHLORIDE 0.9 % IV SOLN: 250.0000 mL | INTRAVENOUS | Status: DC | PRN

## 2019-07-15 MED ORDER — APIXABAN 2.5 MG PO TABS
2.5000 mg | ORAL_TABLET | Freq: Two times a day (BID) | ORAL | Status: DC
  Administered 2019-07-15 – 2019-07-19 (×9): 2.5 mg via ORAL
  Filled 2019-07-15 (×10): qty 1

## 2019-07-15 MED ORDER — ACETAMINOPHEN 325 MG PO TABS: 650.0000 mg | ORAL_TABLET | Freq: Four times a day (QID) | ORAL | Status: DC | PRN

## 2019-07-15 NOTE — ED Notes (Signed)
Pt accidentally took off [place] and O2 dropped to 76%. [place] reapplied and oxygen returned to mid 90s

## 2019-07-15 NOTE — Plan of Care (Signed)
Paged to review rhythm - report of possible VT while in ED. Of note, she has a history of AF, NSCLC on palliative nivolumab, recent COVID-19 infection in December, and NICM diagnosed last month.   Telemetry shows predominantly sinus rhythm with brief salvos of irregular narrow complex tachycardias. One longer run lasting a minute or so. Rate is < 150. There is no wide complex tachycardia and QRS appears conducted, thus VT is not in the differential. These are SVTs most consistent with atrial tachycardias (MAT), less likely AF given the brief duration.   These are driven by underlying illness and metabolic derangements. Treat underlying causes - ie potassium 2.7, acute on chronic hypoxic respiratory failure, etc. No escalation of antiarrhythmic medications are necessary (on metoprolol and amiodarone chronically).

## 2019-07-15 NOTE — Progress Notes (Signed)
Patient placed in observation after midnight but care began in the ER prior to midnight.  Please see H&P by Dr. Linda Hedges.  Here with shortness of breath and hypoxemia.  O2 sats were in the 70s on room air.  She was given IV Lasix in the ER and patient states she feels she is improved.  Will adjust medications and to continue IV Lasix for another day and reassess in the morning.  Lungs with rhonchorous breath sounds in all fields.  Patient does have a history of lung cancer and is being treated by Dr. Earlie Server.  Patient is on Eliquis routinely so doubt this could be a pulmonary emboli.  She also had a CTA during last hospitalization.  We will order home O2 scan for the a.m. we will also continue aggressive potassium replacement

## 2019-07-15 NOTE — Progress Notes (Signed)
Initial Nutrition Assessment  DOCUMENTATION CODES:   Severe malnutrition in context of chronic illness  INTERVENTION:   -Ensure Enlive po BID, each supplement provides 350 kcal and 20 grams of protein -MVI with minerals daily -Magic cup TID with meals, each supplement provides 290 kcal and 9 grams of protein  NUTRITION DIAGNOSIS:   Severe Malnutrition related to chronic illness(stage IV lung cancer) as evidenced by moderate fat depletion, severe fat depletion, moderate muscle depletion, severe muscle depletion.  GOAL:   Patient will meet greater than or equal to 90% of their needs  MONITOR:   PO intake, Supplement acceptance, Labs, Weight trends, Skin, I & O's  REASON FOR ASSESSMENT:   Malnutrition Screening Tool    ASSESSMENT:   [name] is a 86 y.o. female with medical history significant of  severe comorbid medical illnesses including history of lung cancer currently treated with immunotherapy but historically has had radiation and other chemotherapy treatments.  She was recently admitted to the heart 1\23\2012 for dyspnea and significant elevation of cardiac enzymes with diagnosis of possible Takotsubo cardiomyopathy with CHF and rapid A. fib.  She was discharged home without oxygen with oxygen saturations at 92% with ambulation.  Patient reports that she was on her way to the bathroom this evening she became extremely short of breath.  She reports that she was gasping and could not catch her breath.  Her daughter checked her oxygen saturation reporting it was in the 58s.  At that time they called EMS.  Patient reports she has felt better after they administered oxygen.  She denies she ever had any chest pain or palpitations.  She denies any increased swelling or pain in her legs. She reports she was doing relatively well until this episode tonight.  Pt admitted with rapid heart rate and SOB with hypoxemia.   Reviewed I/O's: -375 ml x 24 hours   UOP: 500 ml x 24  hours  Spoke with pt at bedside, who was pleasant in good spirits today. She reports working well with therapy earlier this morning.   Pt shares she has had chronically poor appetite and weight loss since undergoing chemotherapy treatment for the past 13 years. Pt reports that she has lost her sense of smell and taste due to chemo and eating is just not appealing anymore. Pt grazes throughout the day and diet is very limited (frequently consumed foods are orange juice, grapes, and Boost supplements). Pt shares she consumed a banana, juice, and Ensure this AM. Pt reports she drinks more liquids than she does eat solid food.   Pt has experienced a 4.9% wt loss over the past 3 months, which is not significant for time frame, however, concerning due to limited diet and poor oral intake.   Discussed importance of good meal and supplement intake to promote healing. She is amenable to Ensure and Magic Cups.  Medications reviewed.   Labs reviewed: Na: 125, K: 2.7.   NUTRITION - FOCUSED PHYSICAL EXAM:    Most Recent Value  Orbital Region  Moderate depletion  Upper Arm Region  Severe depletion  Thoracic and Lumbar Region  Severe depletion  Buccal Region  Severe depletion  Temple Region  Severe depletion  Clavicle Bone Region  Severe depletion  Clavicle and Acromion Bone Region  Severe depletion  Scapular Bone Region  Severe depletion  Dorsal Hand  Severe depletion  Patellar Region  Moderate depletion  Anterior Thigh Region  Moderate depletion  Posterior Calf Region  Moderate depletion  Edema (RD Assessment)  None  Hair  Reviewed  Eyes  Reviewed  Mouth  Reviewed  Skin  Reviewed  Nails  Reviewed       Diet Order:   Diet Order            Diet Heart Room service appropriate? Yes; Fluid consistency: Thin  Diet effective now              EDUCATION NEEDS:   Education needs have been addressed  Skin:  Skin Assessment: Reviewed RN Assessment  Last BM:  07/10/19  Height:   Ht  Readings from Last 1 Encounters:  07/15/19 5\' 2"  (1.575 m)    Weight:   Wt Readings from Last 1 Encounters:  07/15/19 46.2 kg    Ideal Body Weight:  50 kg  BMI:  Body mass index is 18.62 kg/m.  Estimated Nutritional Needs:   Kcal:  1500-1700  Protein:  55-70 grams  Fluid:  > 1.5 L    Loistine Chance, RD, LDN, [place] Registered Dietitian II Certified Diabetes Care and Education Specialist Please refer to [facility] for RD and/or RD on-call/weekend/after hours pager

## 2019-07-15 NOTE — Care Management Obs Status (Signed)
[facility] NOTIFICATION   Patient Details  Name: KENDELL GAMMON MRN: [id number] Date of Birth: [birth year]   Medicare Observation Status Notification Given:       Zenon Mayo, RN 07/15/2019, 5:04 PM

## 2019-07-15 NOTE — Progress Notes (Signed)
8 beats of VT.  Asymptomatic.  Notified APP.

## 2019-07-15 NOTE — H&P (Addendum)
History and Physical    [name] DOB: 09/21/1932 DOA: 07/14/2019  PCP: [name], MD (Confirm with patient/family/NH records and if not entered, this has to be entered at [facility] point of entry) Patient coming from: home  I have personally briefly reviewed patient's old medical records in [place]  Chief Complaint: sudden SOB, hypoxemia  HPI: [name] is a 86 y.o. female with medical history significant of  severe comorbid medical illnesses including history of lung cancer currently treated with immunotherapy but historically has had radiation and other chemotherapy treatments.  She was recently admitted to the heart 1\23\2012 for dyspnea and significant elevation of cardiac enzymes with diagnosis of possible Takotsubo cardiomyopathy with CHF and rapid A. fib.  She was discharged home without oxygen with oxygen saturations at 92% with ambulation.  Patient reports that she was on her way to the bathroom this evening she became extremely short of breath.  She reports that she was gasping and could not catch her breath.  Her daughter checked her oxygen saturation reporting it was in the 63s.  At that time they called EMS.  Patient reports she has felt better after they administered oxygen.  She denies she ever had any chest pain or palpitations.  She denies any increased swelling or pain in her legs. She reports she was doing relatively well until this episode tonight.  (For level 3, the HPI must include 4+ descriptors: Location, Quality, Severity, Duration, Timing, Context, modifying factors, associated signs/symptoms and/or status of 3+ chronic problems.)  (Please avoid self-populating past medical history here) (The initial 2-3 lines should be focused and good to copy and paste in the HPI section of the daily progress note).  ED Course: Hemodynamically stablle. Lab revealed K 2.7, glu 192, Tropponin 42. EKX with SR x 2. Telemetry with short tachycardic runs.  CXR with A-S disease c/w edema vs PNA. Patient seen by cardiology, Dr. Kalman Shan, who opines that the patient is having MAT with PAF less likely. He recommended no changes in medication.  Review of Systems: As per HPI otherwise 10 point review of systems negative.  Unacceptable ROS statements: "10 systems reviewed," "Extensive" (without elaboration).  Acceptable ROS statements: "All others negative," "All others reviewed and are negative," and "All others unremarkable," with at [place] documented Can't double dip - if using for HPI can't use for ROS  Past Medical History:  Diagnosis Date  . Atrial fibrillation ([place])   . FHx: chemotherapy 2008&2011   4 cycles cisplatin,taxotere,2008& carboplatin and Alimta 2011  . History of migraine headaches   . Hypercholesterolemia   . lung ca dx'd 06/2006   rt and lt lung  . Lung cancer ([place]) 3/14/1   right- Adenocarcinoma w/bronchioalveolar features  . Radiation 11/01/15-11/14/15   right upper lobe 30 gray  . Radiation pneumonitis ([place]) 01/24/2016    Past Surgical History:  Procedure Laterality Date  . CATARACT EXTRACTION  2013   bilateral  . EMBOLECTOMY Right 03/13/2019   Procedure: RIGHT BRACHIAL, RADIAL, ULNAR EMBOLECTOMY;  Surgeon: Elam Dutch, MD;  Location: [facility] OR;  Service: Vascular;  Laterality: Right;  . left  lowerlung lobectomy Left 06/19/2006   Dr.Burney,Left lower lobectomy  . LEFT HEART CATH AND CORONARY ANGIOGRAPHY N/A 06/25/2019   Procedure: LEFT HEART CATH AND CORONARY ANGIOGRAPHY;  Surgeon: Martinique, Peter M, MD;  Location: [facility];  Service: Cardiovascular;  Laterality: N/A;  . MYRINGOTOMY Bilateral 06/25/2016  . Porta-cath  2011   Soc  Hx - lives with daughter. Retired  reports that she has never smoked. She has never used smokeless tobacco. She reports that she does not drink alcohol or use drugs.  Allergies  Allergen Reactions  . Simvastatin Other (See Comments)    Cluster migraines    Family History   Problem Relation Age of Onset  . Hypertension Father    Unacceptable: Noncontributory, unremarkable, or negative. Acceptable: Family history reviewed and not pertinent (If you reviewed it)  Prior to Admission medications   Medication Sig Start Date End Date Taking? Authorizing Provider  albuterol (VENTOLIN HFA) 108 (90 Base) MCG/ACT inhaler Inhale 2 puffs into the lungs every 6 (six) hours as needed for wheezing or shortness of breath.   Yes [provider]  amiodarone (PACERONE) 200 MG tablet TAKE 1 TABLET BY MOUTH EVERY DAY 07/08/19  Yes Lorretta Harp, MD  apixaban (ELIQUIS) 2.5 MG TABS tablet Take 1 tablet (2.5 mg total) by mouth 2 (two) times daily. 03/16/19  Yes Laurence Slate M, [name]  furosemide (LASIX) 20 MG tablet Take 1 tablet (20 mg total) by mouth daily. 06/25/19  Yes Kathie Dike, MD  Ipratropium-Albuterol (COMBIVENT RESPIMAT) 20-100 MCG/ACT AERS respimat Inhale 1 puff into the lungs every 6 (six) hours.   Yes [provider]  metoprolol succinate (TOPROL-XL) 25 MG 24 hr tablet Take 1 tablet (25 mg total) by mouth daily. Take with or immediately following a meal.  Refills per cardiology or primary care 07/13/19  Yes Croitoru, Mihai, MD  Multiple Vitamin (MULTIVITAMIN WITH MINERALS) TABS tablet Take 1 tablet by mouth daily.   Yes [provider]  sertraline (ZOLOFT) 25 MG tablet Take 25 mg by mouth daily. 07/02/19  Yes [provider]  vitamin C (ASCORBIC ACID) 500 MG tablet Take 500 mg by mouth daily.   Yes [provider]    Physical Exam: Vitals:   07/14/19 2345 07/15/19 0000 07/15/19 0015 07/15/19 0030  BP: 104/67 115/74 140/74 137/74  Pulse: (!) 57 (!) 59 62 62  Resp: (!) 22 (!) 22 (!) 22 17  Temp:      SpO2: 93% (!) 89% 90% 92%  Weight:      Height:        Constitutional: NAD, calm, comfortable Vitals:   07/14/19 2345 07/15/19 0000 07/15/19 0015 07/15/19 0030  BP: 104/67 115/74 140/74 137/74  Pulse: (!) 57 (!) 59 62 62   Resp: (!) 22 (!) 22 (!) 22 17  Temp:      SpO2: 93% (!) 89% 90% 92%  Weight:      Height:       General: Emaciated elderly woman in no acute distress Eyes: PERRL, lids and conjunctivae normal ENMT: Mucous membranes are moist. Posterior pharynx clear of any exudate or lesions..  Neck: normal, supple, no masses, no thyromegaly Respiratory: Upper airway wheezing, prolonged expiratory phase. Decreased BS Right. BS clear left back. Anterior left chest with expiratory wheezing. Cardiovascular: Regular rate and rhythm on exam no murmurs / rubs / gallops. No extremity edema. 1+ pedal pulses. No carotid bruits.  Abdomen: no tenderness, no masses palpated. No hepatosplenomegaly. Bowel sounds positive.  Musculoskeletal: no clubbing / cyanosis. interosseou wasting both hands. No joint deformity upper and lower extremities. Good ROM, no contractures. Normal muscle tone.  Skin: no rashes, lesions, ulcers. No induration Neurologic: CN 2-12 grossly intact. Sensation intact, DTR normal. Strength 4/5 in all 4.  Psychiatric: Normal judgment and insight. Alert and oriented x 3. Normal mood.   (  Anything < 9 systems with 2 bullets each down codes to level 1) (If patient refuses exam can't bill higher level) (Make sure to document decubitus ulcers present on admission -- if possible -- and whether patient has chronic indwelling catheter at time of admission)  Labs on Admission: I have personally reviewed following labs and imaging studies  CBC: Recent Labs  Lab 07/14/19 2206  WBC 9.3  NEUTROABS 7.9*  HGB 12.6  HCT 37.1  MCV 96.9  PLT 161   Basic Metabolic Panel: Recent Labs  Lab 07/14/19 2206  NA 125*  K 2.7*  CL 85*  CO2 26  GLUCOSE 183*  BUN 14  CREATININE 0.60  CALCIUM 8.0*   GFR: Estimated Creatinine Clearance: 37.5 mL/min (by C-G formula based on SCr of 0.6 mg/dL). Liver Function Tests: Recent Labs  Lab 07/14/19 2206  AST 34  ALT 31  ALKPHOS 126  BILITOT 0.8  PROT 6.7   ALBUMIN 2.4*   No results for input(s): LIPASE, AMYLASE in the last 168 hours. No results for input(s): AMMONIA in the last 168 hours. Coagulation Profile: Recent Labs  Lab 07/14/19 2206  INR 1.2   Cardiac Enzymes: No results for input(s): CKTOTAL, CKMB, CKMBINDEX, TROPONINI in the last 168 hours. BNP (last 3 results) No results for input(s): PROBNP in the last 8760 hours. HbA1C: No results for input(s): HGBA1C in the last 72 hours. CBG: No results for input(s): GLUCAP in the last 168 hours. Lipid Profile: No results for input(s): CHOL, HDL, LDLCALC, TRIG, CHOLHDL, LDLDIRECT in the last 72 hours. Thyroid Function Tests: No results for input(s): TSH, T4TOTAL, FREET4, T3FREE, THYROIDAB in the last 72 hours. Anemia Panel: No results for input(s): VITAMINB12, FOLATE, FERRITIN, TIBC, IRON, RETICCTPCT in the last 72 hours. Urine analysis:    Component Value Date/Time   COLORURINE YELLOW 07/14/2019 2340   APPEARANCEUR HAZY (A) 07/14/2019 2340   LABSPEC 1.012 07/14/2019 2340   LABSPEC 1.030 03/17/2012 1051   PHURINE 6.0 07/14/2019 2340   GLUCOSEU 50 (A) 07/14/2019 2340   HGBUR NEGATIVE 07/14/2019 2340   BILIRUBINUR NEGATIVE 07/14/2019 2340   BILIRUBINUR Negative 03/17/2012 1051   KETONESUR NEGATIVE 07/14/2019 2340   PROTEINUR 30 (A) 07/14/2019 2340   NITRITE NEGATIVE 07/14/2019 2340   LEUKOCYTESUR TRACE (A) 07/14/2019 2340   LEUKOCYTESUR Negative 03/17/2012 1051    Radiological Exams on Admission: DG Chest Port 1 View  Result Date: 07/14/2019 CLINICAL DATA:  Short of breath, nausea and vomiting, lung cancer EXAM: PORTABLE CHEST 1 VIEW COMPARISON:  06/23/2019, 06/25/2019 FINDINGS: Single frontal view of the chest demonstrates masslike consolidation right upper lobe consistent with known lung cancer. Cardiac silhouette is stable. Right chest wall port unchanged. Since the prior exam, there is progression of the left-sided ground-glass airspace disease. No evidence pneumothorax.  No acute bony abnormality. IMPRESSION: 1. Developing left-sided airspace disease consistent with asymmetric edema or pneumonia. 2. Right upper lobe mass consistent with known lung cancer. Electronically Signed   By: Randa Ngo M.D.   On: 07/14/2019 22:30    EKG: Independently reviewed. SR with LAFB, LVH, prolonged QT  Assessment/Plan Active Problems:   Multifocal atrial tachycardia (HCC)   Bilateral lung cancer (HCC)   Chronic systolic heart failure (HCC)   Takotsubo cardiomyopathy   ACS (acute coronary syndrome) ([place])  (please populate well all problems here in Problem List. (For example, if patient is on BP meds at home and you resume or decide to hold them, it is a problem that needs to be  her. Same for CAD, COPD, HLD and so on)   1. Cardiology- patient presented due to rapid heart rate, SOB with hypoxemia. In ED EKG with SR, tele revealed short runs of rapid tachycardia - reviewed by Dr. Kalman Shan for cardiology who favors MAT >> PAF. CXR with ? pulumonary edema w/ no evidence of PNA. BNP 147. Plan Continue home meds with a bum[p in amiodarone to 200 mg daily - will d/c amio drip  Telemetry observation for tachyrhythmia  Single dose of IV lasix for possible early pulmonary edema and continue home oral dose  2. Pul - patient on immunology tx for lung cancer. She has chronic wheezing by her report. Plan Continue home regimen of combivent respimat and prn albuterol  O2 prn for Sats < 88%  3. Code status - patient with no Advanced care directive. Poor condition. Discussed with daughter with recommendation to visit [email or web address] and [facility]. THe patiernt and family should discuss her ACD and meet with PCP -a [facility] form is recommended.   DVT prophylaxis: eliquis (Lovenox/Heparin/SCD's/anticoagulated/None (if comfort care) Code Status: full code (Full/Partial (specify details) Family Communication: spoke with daughter. Discussed code status - full code for now but encouraged a  discussion around these issues. Referred to [email or web address] and to google [facility] Explained tx plan. Answered all questions (Specify name, relationship. Do not write "discussed with patient". Specify tel # if discussed over the phone) Disposition Plan: home when stable (specify when and where you expect patient to be discharged) Consults called: cardiology - see by night fellow Dr. Kalman Shan. Cardiology should follow (with names) Admission status: observation -tele (inpatient / obs / tele / medical floor / SDU)   Adella Hare MD [facility] Pager [phone]  If 7PM-7AM, please contact night-coverage [email or web address] Password [facility]  07/15/2019, 1:39 AM

## 2019-07-15 NOTE — Progress Notes (Signed)
Eval complete, formal note pending. Able to mobilize approximately 63ft with RW/min guard, reports this is her baseline. Daughter can be available 24/7. Recommend HHPT and S for mobility.   Windell Norfolk, DPT, PN1   Supplemental Physical Therapist [facility]    Pager [phone] Acute Rehab Office [phone]

## 2019-07-15 NOTE — Evaluation (Signed)
Physical Therapy Evaluation Patient Details Name: [name] MRN: [id number] DOB: 1932-08-31 Today's Date: 07/15/2019   History of Present Illness  86yo female with recent hospitalization due to possible Takotsubo caridomyopathy with CHF/rapid A-fib and discharged home on room air. Now complaining of extreme SOB with ambulation, found to be in 70s on room air. PE negative. PMH A-fib, hx lung CA with radiation and chemo, embolectomy, cardaic cath  Clinical Impression   Patient received in bed, very pleasant and willing to work with therapy today. See below for mobility/assist levels. Tolerated gait training in room with RW/min guard on room air with sats dropping no lower than 90% with activity, however is quite easily fatigued today. She was left up in the chair with all needs met, chair alarm active. Feel she would be able to return home as long as family is able to provide 24/7A.     Follow Up Recommendations Home health PT;Supervision/Assistance - 24 hour    Equipment Recommendations  3in1 (PT)    Recommendations for Other Services       Precautions / Restrictions Precautions Precautions: Fall;Other (comment) Precaution Comments: watch SPO2 Restrictions Weight Bearing Restrictions: No      Mobility  Bed Mobility Overal bed mobility: Modified Independent             General bed mobility comments: increased time and effort, HOB slightly elevated  Transfers Overall transfer level: Needs assistance Equipment used: Rolling walker (2 wheeled) Transfers: Sit to/from Stand Sit to Stand: Min assist         General transfer comment: MinA to boost to full upright position and gain balance, cues for correct hand placement  Ambulation/Gait Ambulation/Gait assistance: Min guard Gait Distance (Feet): 25 Feet Assistive device: Rolling walker (2 wheeled) Gait Pattern/deviations: Step-through pattern;Decreased step length - right;Decreased step length - left;Trunk  flexed Gait velocity: decreased   General Gait Details: steady with RW, min guard for safety; SPO2 no lower than 90% on room air today with gait in room which she reports was her baseline distance walking in her house  Stairs            Wheelchair Mobility    Modified Rankin (Stroke Patients Only)       Balance Overall balance assessment: History of Falls;Needs assistance Sitting-balance support: Feet supported Sitting balance-Leahy Scale: Good     Standing balance support: Bilateral upper extremity supported;During functional activity Standing balance-Leahy Scale: Fair Standing balance comment: reliant on BUE support                             Pertinent Vitals/Pain      Home Living       07/15/19 [place] expects to be discharged to: Private residence  Living Arrangements Children  Available Help at Discharge Family;Available 24 hours/day (daughter works at home now and can be with her)  Type of [place] to enter  Entrance Stairs-Number of Steps 2  no rails but steps are small, back door has a ramp and deck and she uses this primarily  [place];Able to live on main level with bedroom/bathroom  [employer] Grab bars - tub/shower;Wheelchair - [employer] - 4 wheels  Prior Function  Level of Independence Independent  Comments does not wear O2 at home- has been using rollator for the past week  due to feeling weak  Communication  Communication No difficulties                      Prior Function                 Hand Dominance        Extremity/Trunk Assessment   Upper Extremity Assessment Upper Extremity Assessment: Generalized weakness    Lower Extremity Assessment Lower Extremity Assessment: Generalized weakness    Cervical / Trunk Assessment Cervical / Trunk  Assessment: Kyphotic  Communication      Cognition Arousal/Alertness: Awake/alert Behavior During Therapy: WFL for tasks assessed/performed;Impulsive Overall Cognitive Status: Within Functional Limits for tasks assessed                                 General Comments: needed multiple cues to not stand up before PT/room was ready      General Comments      Exercises     Assessment/Plan    PT Assessment Patient needs continued PT services  PT Problem List Decreased strength;Decreased safety awareness;Decreased activity tolerance;Decreased balance;Decreased mobility;Cardiopulmonary status limiting activity       PT Treatment Interventions DME instruction;Balance training;Gait training;Stair training;Functional mobility training;Patient/family education;Therapeutic activities;Therapeutic exercise    PT Goals (Current goals can be found in the Care Plan section)  Acute Rehab PT Goals Patient Stated Goal: go home PT Goal Formulation: With patient Time For Goal Achievement: 07/29/19 Potential to Achieve Goals: Good    Frequency Min 3X/week   Barriers to discharge        Co-evaluation               AM-PAC PT "6 Clicks" Mobility  Outcome Measure Help needed turning from your back to your side while in a flat bed without using bedrails?: None Help needed moving from lying on your back to sitting on the side of a flat bed without using bedrails?: A Little Help needed moving to and from a bed to a chair (including a wheelchair)?: A Little Help needed standing up from a chair using your arms (e.g., wheelchair or bedside chair)?: A Little Help needed to walk in hospital room?: A Little Help needed climbing 3-5 steps with a railing? : A Lot 6 Click Score: 18    End of Session Equipment Utilized During Treatment: Gait belt Activity Tolerance: Patient tolerated treatment well Patient left: in chair;with call bell/phone within reach;with chair alarm set Nurse  Communication: Mobility status PT Visit Diagnosis: Unsteadiness on feet (R26.81);History of falling (Z91.81);Muscle weakness (generalized) (M62.81);Hemiplegia and hemiparesis;Difficulty in walking, not elsewhere classified (R26.2)    Time: 5056-9794 PT Time Calculation (min) (ACUTE ONLY): 25 min   Charges:   PT Evaluation $PT Eval Moderate Complexity: 1 Mod PT Treatments $Gait Training: 8-22 mins        Windell Norfolk, DPT, PN1   Supplemental Physical Therapist [place]    Pager [phone] Acute Rehab Office [phone]

## 2019-07-16 ENCOUNTER — Other Ambulatory Visit (HOSPITAL_COMMUNITY): Payer: Medicare PPO

## 2019-07-16 ENCOUNTER — Inpatient Hospital Stay (HOSPITAL_COMMUNITY): Payer: Medicare PPO

## 2019-07-16 DIAGNOSIS — I5021 Acute systolic (congestive) heart failure: Secondary | ICD-10-CM

## 2019-07-16 DIAGNOSIS — J9601 Acute respiratory failure with hypoxia: Secondary | ICD-10-CM

## 2019-07-16 DIAGNOSIS — E43 Unspecified severe protein-calorie malnutrition: Secondary | ICD-10-CM | POA: Insufficient documentation

## 2019-07-16 DIAGNOSIS — I5181 Takotsubo syndrome: Secondary | ICD-10-CM

## 2019-07-16 LAB — BASIC METABOLIC PANEL
Anion gap: 8 (ref 5–15)
Anion gap: 11 (ref 5–15)
BUN: 14 mg/dL (ref 8–23)
BUN: 13 mg/dL (ref 8–23)
CO2: 25 mmol/L (ref 22–32)
CO2: 26 mmol/L (ref 22–32)
Calcium: 8.9 mg/dL (ref 8.9–10.3)
Calcium: 8.3 mg/dL — ABNORMAL LOW (ref 8.9–10.3)
Chloride: 95 mmol/L — ABNORMAL LOW (ref 98–111)
Chloride: 91 mmol/L — ABNORMAL LOW (ref 98–111)
Creatinine, Ser: 0.78 mg/dL (ref 0.44–1.00)
Creatinine, Ser: 0.72 mg/dL (ref 0.44–1.00)
GFR calc Af Amer: >60 mL/min (ref >60)
GFR calc Af Amer: >60 mL/min (ref >60)
GFR calc non Af Amer: >60 mL/min (ref >60)
GFR calc non Af Amer: >60 mL/min (ref >60)
Glucose, Bld: 148 mg/dL — ABNORMAL HIGH (ref 70–99)
Glucose, Bld: 138 mg/dL — ABNORMAL HIGH (ref 70–99)
Potassium: 6 mmol/L — ABNORMAL HIGH (ref 3.5–5.1)
Potassium: 4.1 mmol/L (ref 3.5–5.1)
Sodium: 128 mmol/L — ABNORMAL LOW (ref 135–145)
Sodium: 128 mmol/L — ABNORMAL LOW (ref 135–145)

## 2019-07-16 LAB — CBC
HCT: 37.4 % (ref 36.0–46.0)
Hemoglobin: 12.7 g/dL (ref 12.0–15.0)
MCH: 32.8 pg (ref 26.0–34.0)
MCHC: 34 g/dL (ref 30.0–36.0)
MCV: 96.6 fL (ref 80.0–100.0)
Platelets: 335 10*3/uL (ref 150–400)
RBC: 3.87 MIL/uL (ref 3.87–5.11)
RDW: 16.8 % — ABNORMAL HIGH (ref 11.5–15.5)
WBC: 7.7 10*3/uL (ref 4.0–10.5)
nRBC: 0 % (ref 0.0–0.2)

## 2019-07-16 LAB — PHOSPHORUS: Phosphorus: 3.5 mg/dL (ref 2.5–4.6)

## 2019-07-16 LAB — MAGNESIUM: Magnesium: 1.8 mg/dL (ref 1.7–2.4)

## 2019-07-16 LAB — ECHOCARDIOGRAM LIMITED
Height: 62 in
Weight: 1585.6 oz

## 2019-07-16 MED ORDER — LEVALBUTEROL HCL 0.63 MG/3ML IN NEBU
0.6300 mg | INHALATION_SOLUTION | Freq: Four times a day (QID) | RESPIRATORY_TRACT | Status: DC | PRN
  Administered 2019-07-16: 0.63 mg via RESPIRATORY_TRACT
  Filled 2019-07-16 (×3): qty 3

## 2019-07-16 MED ORDER — SERTRALINE HCL 50 MG PO TABS
25.0000 mg | ORAL_TABLET | Freq: Every day | ORAL | Status: DC
  Administered 2019-07-16 – 2019-07-18 (×3): 25 mg via ORAL
  Filled 2019-07-16 (×3): qty 1

## 2019-07-16 MED ORDER — PREDNISONE 20 MG PO TABS
20.0000 mg | ORAL_TABLET | Freq: Once | ORAL | Status: AC
  Administered 2019-07-16: 17:00:00 20 mg via ORAL
  Filled 2019-07-16: qty 1

## 2019-07-16 MED ORDER — LEVALBUTEROL TARTRATE 45 MCG/ACT IN AERO: 2.0000 | INHALATION_SPRAY | Freq: Four times a day (QID) | RESPIRATORY_TRACT | Status: DC | PRN

## 2019-07-16 MED ORDER — CHLORHEXIDINE GLUCONATE CLOTH 2 % EX PADS
6.0000 | MEDICATED_PAD | Freq: Every day | CUTANEOUS | Status: DC
  Administered 2019-07-17 – 2019-07-19 (×3): 6 via TOPICAL

## 2019-07-16 MED ORDER — PREDNISONE 20 MG PO TABS
20.0000 mg | ORAL_TABLET | Freq: Every day | ORAL | Status: DC
  Administered 2019-07-17 – 2019-07-18 (×2): 20 mg via ORAL
  Filled 2019-07-16: qty 2
  Filled 2019-07-16: qty 1

## 2019-07-16 MED ORDER — MAGNESIUM SULFATE 2 GM/50ML IV SOLN
2.0000 g | Freq: Once | INTRAVENOUS | Status: AC
  Administered 2019-07-16: 2 g via INTRAVENOUS
  Filled 2019-07-16: qty 50

## 2019-07-16 NOTE — Progress Notes (Signed)
PROGRESS NOTE    [name]  GYI:[id number] DOB: 1932-09-30 DOA: 07/14/2019 PCP: [name], MD    Brief Narrative:  86 year old female with history of recurrent non-small cell lung cancer currently on immunotherapy nivolumab, previous history of radiation, resection and chemo, recent history of stress cardiomyopathy,, A. fib, multifocal atrial tachycardia, protein calorie malnutrition and recurrent hospitalization, recent COVID-19 infection presented to the emergency room with shortness of breath and wheezing.  Patient recently had cardiac cath, no major coronary artery disease, EF 35%, discharged home on Lasix.  In the emergency room, hemodynamically stable.  Potassium 2.7.  Troponins normal.  Had variable tachycardia with some multifocal atrial tachycardia.  Chest x-ray shows bilateral perihilar prominence, no pleural effusion.  BNP was 452.  Admitted with IV Lasix.   Assessment & Plan:   Principal Problem:   Acute respiratory failure (HCC) Active Problems:   Bilateral lung cancer (HCC)   Chronic systolic heart failure (HCC)   Takotsubo cardiomyopathy   Multifocal atrial tachycardia (HCC)   Protein-calorie malnutrition, severe  #1 acute respiratory failure with hypoxia: Multifactorial.  With underlying structural lung disease and chronic lung issues including lobectomy x2, radiation of the lung.  Also has component of congestive heart failure. Patient has significant wheezing and coarse crackles that is probably due to a structural lung disease. No evidence of active infection. Start aggressive chest physiotherapy, bronchodilator therapy with Xopenex to avoid tachycardia, deep breathing exercises, incentive spirometry and flutter valve therapy.  Mobilize. Will add low-dose prednisone to try if that helps with her bronchospasms.  #2 chronic systolic heart failure: With some evidence of pulmonary fluid overload.  No peripheral fluid overload.  Doubt heart failure exacerbation.   Recent EF 35%.  Recheck limited echocardiogram today to evaluate ejection fraction. Continue IV Lasix along with potassium supplements. Potassium overcorrected, discontinue supplements and recheck levels in afternoon. Magnesium, correct and recheck levels in the morning.  Also check phosphorus levels.  #3 bilateral lung cancer: Since 2008.  Multiple surgeries chemo and radiation and currently on immunotherapy.  Followed by Dr. Earlie Server.  Due for second immunotherapy this week, however she is hospitalized.  She will follow up outpatient.  #4 delirium: Patient had some delirium overnight.  Currently improved.  Using some Zoloft at night that she will continue.  Delirium precautions.  Frequent orientation.  Allow patient's daughter to stay with her.  #5 severe protein calorie malnutrition: Nutrition Status: Nutrition Problem: Severe Malnutrition Etiology: chronic illness(stage IV lung cancer) Signs/Symptoms: moderate fat depletion, severe fat depletion, moderate muscle depletion, severe muscle depletion Interventions: Ensure Enlive (each supplement provides 350kcal and 20 grams of protein), MVI, Magic cup  #6 paroxysmal A. fib: Currently rate controlled.  She had episodes of multifocal atrial tachycardia.  Aggressive electrolyte replacement to keep potassium more than 4, magnesium more than 2.  Therapeutic on Eliquis.  #7 advance care planning: Given her bilateral lung cancer, poor performance status, severe malnutrition with BMI 18 and recurrent hospitalization, I discussed with patient and her daughter [name] at bedside and suggested them to discuss planning, goal of care discussions. They agree with palliative care team consultation.  Will put palliative care team consult. She may benefit with outpatient follow-up by palliative.  DVT prophylaxis: Eliquis Code Status: Full code Family Communication: Daughter at bedside Disposition Plan: patient is from home. Anticipated DC to home,  Barriers to discharge active symptoms, short of breath, wheezing and fluid overload.   Consultants:   Palliative care medicine  Procedures:   None  Antimicrobials:   None   Subjective: Patient was seen and examined.  Patient had a restless night.  She knows that she had some hallucinations and was confused.  Could not get good night sleep. Currently with daughter at bedside and all her symptoms are resolved. Has chronic cough with worsening wheezing today.  No sputum production. Telemetry shows episodic PACs and tachycardia.  Patient denies any chest pain or palpitations.  Objective: Vitals:   07/16/19 0338 07/16/19 0342 07/16/19 0602 07/16/19 0840  BP: 119/78 (!) 143/67 (!) 145/64 (!) 105/58  Pulse: 75 81 71 74  Resp:   (!) 24 20  Temp:   98.4 F (36.9 C) 98.1 F (36.7 C)  TempSrc:   Oral Oral  SpO2: 92% 100% 91% 95%  Weight:  45 kg    Height:        Intake/Output Summary (Last 24 hours) at 07/16/2019 0858 Last data filed at 07/16/2019 0841 Gross per 24 hour  Intake 1003.61 ml  Output 1825 ml  Net -821.39 ml   Filed Weights   07/14/19 2127 07/15/19 0309 07/16/19 0342  Weight: 45.4 kg 46.2 kg 45 kg    Examination:  General exam: Appears calm and comfortable, while she was eating breakfast on my interview.  She looks quite comfortable, on room air at rest. She is chronically sick looking and cachectic. Respiratory system: Bilateral conducted airway sounds.  Expiratory wheezes and coarse crackles at bases. Cardiovascular system: S1 & S2 heard, RRR. No JVD, murmurs, rubs, gallops or clicks. No pedal edema. Gastrointestinal system: Abdomen is nondistended, soft and nontender. No organomegaly or masses felt. Normal bowel sounds heard. Central nervous system: Alert and oriented. No focal neurological deficits. Extremities: Symmetric 5 x 5 power. Skin: No rashes, lesions or ulcers Psychiatry: Judgement and insight appear normal. Mood & affect appropriate.      Data Reviewed: I have personally reviewed following labs and imaging studies  CBC: Recent Labs  Lab 07/14/19 2206 07/16/19 0446  WBC 9.3 7.7  NEUTROABS 7.9*  --   HGB 12.6 12.7  HCT 37.1 37.4  MCV 96.9 96.6  PLT 336 283   Basic Metabolic Panel: Recent Labs  Lab 07/14/19 2206 07/16/19 0446  NA 125* 128*  K 2.7* 6.0*  CL 85* 95*  CO2 26 25  GLUCOSE 183* 148*  BUN 14 14  CREATININE 0.60 0.78  CALCIUM 8.0* 8.9  MG  --  1.8   GFR: Estimated Creatinine Clearance: 37.2 mL/min (by C-G formula based on SCr of 0.78 mg/dL). Liver Function Tests: Recent Labs  Lab 07/14/19 2206  AST 34  ALT 31  ALKPHOS 126  BILITOT 0.8  PROT 6.7  ALBUMIN 2.4*   No results for input(s): LIPASE, AMYLASE in the last 168 hours. No results for input(s): AMMONIA in the last 168 hours. Coagulation Profile: Recent Labs  Lab 07/14/19 2206  INR 1.2   Cardiac Enzymes: No results for input(s): CKTOTAL, CKMB, CKMBINDEX, TROPONINI in the last 168 hours. BNP (last 3 results) No results for input(s): PROBNP in the last 8760 hours. HbA1C: No results for input(s): HGBA1C in the last 72 hours. CBG: No results for input(s): GLUCAP in the last 168 hours. Lipid Profile: No results for input(s): CHOL, HDL, LDLCALC, TRIG, CHOLHDL, LDLDIRECT in the last 72 hours. Thyroid Function Tests: No results for input(s): TSH, T4TOTAL, FREET4, T3FREE, THYROIDAB in the last 72 hours. Anemia Panel: No results for input(s): VITAMINB12, FOLATE, FERRITIN, TIBC, IRON, RETICCTPCT in the last 72 hours. Sepsis Labs:  No results for input(s): PROCALCITON, LATICACIDVEN in the last 168 hours.  Recent Results (from the past 240 hour(s))  SARS CORONAVIRUS 2 (TAT 6-24 HRS) Nasopharyngeal Nasopharyngeal Swab     Status: None   Collection Time: 07/15/19  2:22 AM   Specimen: Nasopharyngeal Swab  Result Value Ref Range Status   SARS Coronavirus 2 NEGATIVE NEGATIVE Final    Comment: (NOTE) SARS-CoV-2 target nucleic acids  are NOT DETECTED. The SARS-CoV-2 RNA is generally detectable in upper and lower respiratory specimens during the acute phase of infection. Negative results do not preclude SARS-CoV-2 infection, do not rule out co-infections with other pathogens, and should not be used as the sole basis for treatment or other patient management decisions. Negative results must be combined with clinical observations, patient history, and epidemiological information. The expected result is Negative. Fact Sheet for Patients: SugarRoll.be Fact Sheet for Healthcare Providers: https://www.woods-mathews.com/ This test is not yet approved or cleared by the [place] FDA and  has been authorized for detection and/or diagnosis of SARS-CoV-2 by FDA under an Emergency Use Authorization (EUA). This EUA will remain  in effect (meaning this test can be used) for the duration of the COVID-19 declaration under Section 56 4(b)(1) of the Act, 21 U.S.C. section 360bbb-3(b)(1), unless the authorization is terminated or revoked sooner. Performed at [facility], [place] [address] [place], [place] [address]          Radiology Studies: DG CHEST PORT 1 VIEW  Result Date: 07/16/2019 CLINICAL DATA:  Shortness of breath EXAM: PORTABLE CHEST 1 VIEW COMPARISON:  07/13/2010 FINDINGS: Cardiac shadow is stable. Persistent paramediastinal/paraspinal mass lesion the right is noted. Right chest wall port is noted at the cavoatrial junction. Aortic calcifications are again seen and stable. Postsurgical changes in the left upper lobe are noted. Patchy airspace opacities are again identified throughout the left lung. No sizable effusion is noted. IMPRESSION: Stable right-sided mass consistent with the given clinical history of lung carcinoma. Persistent left lung opacities. Electronically Signed   By: Inez Catalina M.D.   On: 07/16/2019 08:44   DG Chest Port 1 View  Result Date:  07/14/2019 CLINICAL DATA:  Short of breath, nausea and vomiting, lung cancer EXAM: PORTABLE CHEST 1 VIEW COMPARISON:  06/23/2019, 06/25/2019 FINDINGS: Single frontal view of the chest demonstrates masslike consolidation right upper lobe consistent with known lung cancer. Cardiac silhouette is stable. Right chest wall port unchanged. Since the prior exam, there is progression of the left-sided ground-glass airspace disease. No evidence pneumothorax. No acute bony abnormality. IMPRESSION: 1. Developing left-sided airspace disease consistent with asymmetric edema or pneumonia. 2. Right upper lobe mass consistent with known lung cancer. Electronically Signed   By: Randa Ngo M.D.   On: 07/14/2019 22:30        Scheduled Meds:  amiodarone  200 mg Oral Daily   apixaban  2.5 mg Oral BID   Chlorhexidine Gluconate Cloth  6 each Topical Q0600   feeding supplement (ENSURE ENLIVE)  237 mL Oral BID BM   furosemide  40 mg Intravenous Daily   ipratropium-albuterol  3 mL Inhalation BID   metoprolol succinate  25 mg Oral Daily   multivitamin with minerals  1 tablet Oral Daily   [START ON 07/17/2019] predniSONE  20 mg Oral Q breakfast   sertraline  25 mg Oral Daily   sodium chloride flush  3 mL Intravenous Q12H   Continuous Infusions:  sodium chloride     magnesium sulfate bolus IVPB 2 g (07/16/19  5974)     LOS: 1 day    Time spent: 35 minutes    Barb Merino, MD [facility] Pager [phone]

## 2019-07-16 NOTE — Discharge Instructions (Addendum)
Shortness of Breath, Adult Shortness of breath means you have trouble breathing. Shortness of breath could be a sign of a medical problem. Follow these instructions at home:   Watch for any changes in your symptoms.  Do not use any products that contain nicotine or tobacco, such as cigarettes, e-cigarettes, and chewing tobacco.  Do not smoke. Smoking can cause shortness of breath. If you need help to quit smoking, ask your doctor.  Avoid things that can make it harder to breathe, such as: ? Mold. ? Dust. ? Air pollution. ? Chemical smells. ? Things that can cause allergy symptoms (allergens), if you have allergies.  Keep your living space clean. Use products that help remove mold and dust.  Rest as needed. Slowly return to your normal activities.  Take over-the-counter and prescription medicines only as told by your doctor. This includes oxygen therapy and inhaled medicines.  Keep all follow-up visits as told by your doctor. This is important. Contact a doctor if:  Your condition does not get better as soon as expected.  You have a hard time doing your normal activities, even after you rest.  You have new symptoms. Get help right away if:  Your shortness of breath gets worse.  You have trouble breathing when you are resting.  You feel light-headed or you pass out (faint).  You have a cough that is not helped by medicines.  You cough up blood.  You have pain with breathing.  You have pain in your chest, arms, shoulders, or belly (abdomen).  You have a fever.  You cannot walk up stairs.  You cannot exercise the way you normally do. These symptoms may represent a serious problem that is an emergency. Do not wait to see if the symptoms will go away. Get medical help right away. Call your local emergency services (911 in the [place] Do not drive yourself to the hospital. Summary  Shortness of breath is when you have trouble breathing enough air. It can be a sign  of a medical problem.  Avoid things that make it hard for you to breathe, such as smoking, pollution, mold, and dust.  Watch for any changes in your symptoms. Contact your doctor if you do not get better or you get worse. This information is not intended to replace advice given to you by your health care provider. Make sure you discuss any questions you have with your health care provider. Document Revised: 10/20/2017 Document Reviewed: 10/20/2017 Elsevier Patient Education  [place]. Pneumonitis  Pneumonitis is inflammation of the lungs. Infection or exposure to certain substances or allergens can cause this condition. Allergens are substances that you are allergic to. What are the causes? This condition may be caused by:  An infection from bacteria or a virus (pneumonia).  Exposure to certain substances in the workplace. This includes working on farms and in certain industries. Some substances that can cause this condition include asbestos, silica, inhaled acids, or inhaled chlorine gas.  Repeated exposure to bird feathers, bird feces, or other allergens.  Medicines such as chemotherapy drugs, certain antibiotic medicines, and some heart medicines.  Radiation therapy.  Exposure to mold. A hot tub, sauna, or home humidifier can have mold growing in it, even if it looks clean. You can breathe in the mold through water vapor.  Breathing in (aspirating) stomach contents, food, or liquids into the lungs. What are the signs or symptoms? Symptoms of this condition include:  Shortness of breath or trouble breathing. This  is the most common symptom.  Cough.  Fever.  Decreased energy.  Decreased appetite. How is this diagnosed? This condition may be diagnosed based on:  Your medical history.  Physical exam.  Blood tests.  Other tests, including: ? Pulmonary function test (PFT). This measures how well your lungs work. ? Chest X-ray. ? CT scan of the  lungs. ? Bronchoscopy. In this procedure, your health care provider looks at your airways through an instrument called a bronchoscope. ? Lung biopsy. In this procedure, your health care provider takes a small piece of tissue from your lungs to examine it. How is this treated? Treatment depends on the cause of the condition. If the cause is exposure to a substance, avoiding further exposure to that substance will help reduce your symptoms. Possible medical treatments for pneumonitis include:  Corticosteroid medicine to help decrease inflammation.  Antibiotic medicine to help fight an infection caused by bacteria.  Bronchodilators or inhalers to help relax the muscles and make breathing easier.  Oxygen therapy, if you are having trouble breathing. Follow these instructions at home:  Take or use over-the-counter and prescription medicines only as told by your health care provider. This includes any inhaler use.  Avoid exposure to any substance that caused your pneumonitis. If you need to work with substances that can cause pneumonitis, wear a mask to protect your lungs.  If you were prescribed an antibiotic, take it as told by your health care provider. Do not stop taking the antibiotic even if you start to feel better.  If you were prescribed an inhaler, keep it with you at all times.  Do not use any products that contain nicotine or tobacco, such as cigarettes and e-cigarettes. If you need help quitting, ask your health care provider.  Keep all follow-up visits as told by your health care provider. This is important. Contact a health care provider if:  You have a fever.  Your symptoms get worse. Get help right away if:  You have new or worse shortness of breath.  You develop a blue color (cyanosis) under your fingernails. Summary  Pneumonitis is inflammation of the lungs. This condition can be caused by infection or exposure to certain substances or allergens.  The most common  symptom of this condition is shortness of breath or trouble breathing.  Treatment depends on the cause of your condition. This information is not intended to replace advice given to you by your health care provider. Make sure you discuss any questions you have with your health care provider. Document Revised: 05/02/2017 Document Reviewed: 04/11/2016 Elsevier Patient Education  2020 [place] on my medicine - ELIQUIS (apixaban)  Why was Eliquis prescribed for you? Eliquis was prescribed for you to reduce the risk of a blood clot forming that can cause a stroke if you have a medical condition called atrial fibrillation (a type of irregular heartbeat).  What do You need to know about Eliquis ? Take your Eliquis TWICE DAILY - one tablet in the morning and one tablet in the evening with or without food. If you have difficulty swallowing the tablet whole please discuss with your pharmacist how to take the medication safely.  Take Eliquis exactly as prescribed by your doctor and DO NOT stop taking Eliquis without talking to the doctor who prescribed the medication.  Stopping may increase your risk of developing a stroke.  Refill your prescription before you run out.  After discharge, you should have regular check-up appointments with your healthcare provider that  is prescribing your Eliquis.  In the future your dose may need to be changed if your kidney function or weight changes by a significant amount or as you get older.  What do you do if you miss a dose? If you miss a dose, take it as soon as you remember on the same day and resume taking twice daily.  Do not take more than one dose of ELIQUIS at the same time to make up a missed dose.  Important Safety Information A possible side effect of Eliquis is bleeding. You should call your healthcare provider right away if you experience any of the following: ? Bleeding from an injury or your nose that does not stop. ? Unusual  colored urine (red or dark brown) or unusual colored stools (red or black). ? Unusual bruising for unknown reasons. ? A serious fall or if you hit your head (even if there is no bleeding).  Some medicines may interact with Eliquis and might increase your risk of bleeding or clotting while on Eliquis. To help avoid this, consult your healthcare provider or pharmacist prior to using any new prescription or non-prescription medications, including herbals, vitamins, non-steroidal anti-inflammatory drugs (NSAIDs) and supplements.  This website has more information on Eliquis (apixaban): http://www.eliquis.com/eliquis/home

## 2019-07-16 NOTE — Progress Notes (Signed)
Patient with increased confusion and hallucinations worsening throughout the night.  Daughter Sharyn Lull who is patient's designated visitor called and she will be coming early this morning.

## 2019-07-16 NOTE — Progress Notes (Addendum)
Patient with another 23 beat run of VT.  RN at bedside when occurred.  Patient asymptomatic.  APP with [facility] notified.   Orders given to give 1000 lasix early, repeat potassium lab, and EKG.  EKG complete, lasix given.  Awaiting lab draw.   Additional 17 beat run of VT.

## 2019-07-16 NOTE — Progress Notes (Signed)
CCMD notified of 21 beats VT.  Pt asymptomatic. APP notified.

## 2019-07-16 NOTE — Progress Notes (Signed)
Physical Therapy Treatment Patient Details Name: [name] MRN: [id number] DOB: Dec 04, 1932 Today's Date: 07/16/2019    History of Present Illness 86yo female with recent hospitalization due to possible Takotsubo caridomyopathy with CHF/rapid A-fib and discharged home on room air. Now complaining of extreme SOB with ambulation, found to be in 70s on room air. PE negative. PMH A-fib, hx lung CA with radiation and chemo, embolectomy, cardaic cath    PT Comments    Pt limited by SOB and fatigue during session. Pt unsteady without BUE support during ambulation, also requiring minA to power up during transfers. Pt impulsive at times during session and with recent hallucinations, exacerbating her current falls risk. Pt will benefit from continued acute PT POC to improve activity tolerance and gait quality, as well as to educate the patient on energy conservation methods. PT continues to recommend discharge home with HHPT. Pt will need 24/7 assistance from family upon return home due to current falls risk and cognitive deficits.   Follow Up Recommendations  Home health PT;Supervision/Assistance - 24 hour     Equipment Recommendations  3in1 (PT);Rolling walker with 5" wheels    Recommendations for Other Services       Precautions / Restrictions Precautions Precautions: Fall;Other (comment) Precaution Comments: watch SPO2 Restrictions Weight Bearing Restrictions: No    Mobility  Bed Mobility Overal bed mobility: Modified Independent             General bed mobility comments: increased time, HOB elevated  Transfers Overall transfer level: Needs assistance Equipment used: 1 person hand held assist Transfers: Sit to/from Stand Sit to Stand: Min assist         General transfer comment: pt performing one sit to stand from bed and 4 from commode  Ambulation/Gait Ambulation/Gait assistance: Min assist Gait Distance (Feet): 15 Feet(15' x 2 to and from bathroom) Assistive  device: 1 person hand held assist Gait Pattern/deviations: Step-to pattern;Drifts right/left Gait velocity: decreased Gait velocity interpretation: <1.8 ft/sec, indicate of risk for recurrent falls General Gait Details: pt with unilateral UE support of PT, holding onto furniture with other hand. increased lateral sway requiring minA to maintain balance   Stairs             Wheelchair Mobility    Modified Rankin (Stroke Patients Only)       Balance Overall balance assessment: Needs assistance Sitting-balance support: Single extremity supported;Feet supported Sitting balance-Leahy Scale: Fair Sitting balance - Comments: close supervision   Standing balance support: Bilateral upper extremity supported Standing balance-Leahy Scale: Fair Standing balance comment: minG with BUE support of PT, rails, furniture                            Cognition Arousal/Alertness: Awake/alert Behavior During Therapy: WFL for tasks assessed/performed;Impulsive Overall Cognitive Status: Impaired/Different from baseline Area of Impairment: Safety/judgement                         Safety/Judgement: Decreased awareness of safety     General Comments: pt hallucinating dogs on the roof during session      Exercises      General Comments General comments (skin integrity, edema, etc.): Pt on Room air upon PT arrival, wheezing at rest in bed. PT assists pt into bathroom, subsequently checking SpO2 at 78% on RA. PT places pt on 3L [place] with sats improving to 93%. Pt saturating at 93-95% on room air at rest upon  PT departure, RN made aware.      Pertinent Vitals/Pain Pain Assessment: No/denies pain    Home Living                      Prior Function            PT Goals (current goals can now be found in the care plan section) Acute Rehab PT Goals Patient Stated Goal: go home with family support, improve breathing Progress towards PT goals: Not progressing  toward goals - comment(activity tolerance remains limited due to SOB)    Frequency    Min 3X/week      PT Plan Current plan remains appropriate    Co-evaluation              AM-PAC PT "6 Clicks" Mobility   Outcome Measure  Help needed turning from your back to your side while in a flat bed without using bedrails?: None Help needed moving from lying on your back to sitting on the side of a flat bed without using bedrails?: None Help needed moving to and from a bed to a chair (including a wheelchair)?: A Little Help needed standing up from a chair using your arms (e.g., wheelchair or bedside chair)?: A Little Help needed to walk in hospital room?: A Little Help needed climbing 3-5 steps with a railing? : Total 6 Click Score: 18    End of Session Equipment Utilized During Treatment: Oxygen Activity Tolerance: Patient limited by fatigue Patient left: in chair;with call bell/phone within reach;with family/visitor present Nurse Communication: Mobility status PT Visit Diagnosis: Unsteadiness on feet (R26.81);History of falling (Z91.81);Muscle weakness (generalized) (M62.81);Hemiplegia and hemiparesis;Difficulty in walking, not elsewhere classified (R26.2)     Time: 8850-2774 PT Time Calculation (min) (ACUTE ONLY): 31 min  Charges:  $Gait Training: 8-22 mins $Therapeutic Activity: 8-22 mins                     Zenaida Niece, PT, DPT Acute Rehabilitation Pager: [phone]    Zenaida Niece 07/16/2019, 2:27 PM

## 2019-07-16 NOTE — TOC Initial Note (Signed)
Transition of Care [facility]) - Initial/Assessment Note    Patient Details  Name: [name] MRN: [id number] Date of Birth: [birth year]  Transition of [facility] CM/SW Contact:    Sherrilyn Rist Advanced Care Supervisor Phone Number: [phone] 07/16/2019, 12:18 PM  Clinical Narrative:                 CM talked to daughter who is at the bedside; patient with periods of confusion. PCP is Dr Theadore Nan; has private insurance with [facility] with prescription drug coverage; [facility] [address]; her daughters take her to apts; DME - walker, cane, wheelchair at home; she is active with [facility] for HHPT as prior to admission; talked to [place]- she requested that orders for [facility] be faxed to [phone] at discharge for resumption of services; Attending MD, please enter face to face for [place] orders. CM/SW will continue to follow for progression of care.  Expected Discharge Plan: [place] Barriers to Discharge: No Barriers Identified   Patient Goals and CMS Choice Patient states their goals for this hospitalization and ongoing recovery are:: to get stronger   Choice offered to / list presented to : NA  Expected Discharge Plan and Services Expected Discharge Plan: [place] In-house Referral: NA Discharge Planning Services: CM Consult Post Acute Care Choice: [place] arrangements for the past 2 months: Single Family Home                                      Prior Living Arrangements/Services Living arrangements for the past 2 months: Single Family Home   Patient language and need for interpreter reviewed:: No Do you feel safe going back to the place where you live?: Yes      Need for Family Participation in Patient Care: Yes (Comment)(daughter lives next door) Care giver support system in place?: Yes (comment) Current home services: DME, Home PT Criminal Activity/Legal Involvement  Pertinent to Current Situation/Hospitalization: No - Comment as needed  Activities of Daily Living Home Assistive Devices/Equipment: None ADL Screening (condition at time of admission) Patient's cognitive ability adequate to safely complete daily activities?: Yes Is the patient deaf or have difficulty hearing?: No Does the patient have difficulty seeing, even when wearing glasses/contacts?: No Does the patient have difficulty concentrating, remembering, or making decisions?: No Patient able to express need for assistance with ADLs?: Yes Does the patient have difficulty dressing or bathing?: No Independently performs ADLs?: Yes (appropriate for developmental age) Does the patient have difficulty walking or climbing stairs?: Yes Weakness of Legs: Both Weakness of Arms/Hands: None  Permission Sought/Granted Permission sought to share information with : Case Manager Permission granted to share information with : Yes, Verbal Permission Granted     Permission granted to share info w AGENCY: [place] granted to share info w Relationship: Daughter     Emotional Assessment Appearance:: Developmentally appropriate Attitude/Demeanor/Rapport: Gracious Affect (typically observed): Accepting Orientation: : Fluctuating Orientation (Suspected and/or reported [place])(daughter at bedside, answering questions) Alcohol / Substance Use: Not Applicable Psych Involvement: No (comment)  Admission diagnosis:  Multifocal atrial tachycardia ([place]) [I47.1] Acute respiratory failure ([place]) [J96.00] Patient Active Problem List   Diagnosis Date Noted  . Protein-calorie malnutrition, severe 07/16/2019  . Multifocal atrial tachycardia ([place]) 07/15/2019  . Acute respiratory failure ([place]) 07/15/2019  . Chronic systolic heart failure (  [place]) 07/01/2019  . Takotsubo cardiomyopathy 07/01/2019  . Elevated troponin   . ACS (acute coronary syndrome) ([place]) 06/23/2019  . COVID-19 virus detected  06/23/2019  . Acetabular fracture ([place]) 04/14/2019  . 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 Port-A-Cath in place 12/10/2017  . Goals of care, counseling/discussion 09/10/2017  . Encounter for antineoplastic immunotherapy 09/10/2017  . Bronchitis 08/16/2016  . Radiation pneumonitis ([place]) 01/24/2016  . Encounter for antineoplastic chemotherapy 12/14/2014  . Bilateral lung cancer ([place]) 04/08/2011   PCP:  [name], MD Pharmacy:   [id number] [place], [place] - [address] [address] Phone: [phone] Fax: [phone]  [place], [place] - [address] [place] [address] Phone: [phone] Fax: [phone]     Social Determinants of Health ([place]) Interventions    Readmission Risk Interventions Readmission Risk Prevention Plan 03/16/2019  Transportation Screening Complete  PCP or Specialist Appt within 5-7 Days Complete  Home Care Screening Complete  Medication Review (RN CM) Complete  Some recent data might be hidden

## 2019-07-16 NOTE — Progress Notes (Signed)
PMT consult received and chart reviewed. [place] meeting arranged with patient and daughter tomorrow, 2/13 at [place]. Thank you.  NO CHARGE  Ihor Dow, [place], FNP-C Palliative Medicine Team  Phone: [phone] Fax: [phone]

## 2019-07-16 NOTE — Progress Notes (Signed)
Patient with 18 beats of VT.  APP notified. Pt asymptomatic.

## 2019-07-16 NOTE — Progress Notes (Signed)
Echocardiogram 2D Echocardiogram has been performed.  [place] Deputy Pamula Luther 07/16/2019, 11:48 AM

## 2019-07-17 DIAGNOSIS — J9620 Acute and chronic respiratory failure, unspecified whether with hypoxia or hypercapnia: Secondary | ICD-10-CM

## 2019-07-17 DIAGNOSIS — Z7189 Other specified counseling: Secondary | ICD-10-CM

## 2019-07-17 DIAGNOSIS — R531 Weakness: Secondary | ICD-10-CM

## 2019-07-17 DIAGNOSIS — Z515 Encounter for palliative care: Secondary | ICD-10-CM

## 2019-07-17 LAB — CBC WITH DIFFERENTIAL/PLATELET
Abs Immature Granulocytes: 0.03 10*3/uL (ref 0.00–0.07)
Basophils Absolute: 0 10*3/uL (ref 0.0–0.1)
Basophils Relative: 0 %
Eosinophils Absolute: 0 10*3/uL (ref 0.0–0.5)
Eosinophils Relative: 0 %
HCT: 37.9 % (ref 36.0–46.0)
Hemoglobin: 12.7 g/dL (ref 12.0–15.0)
Immature Granulocytes: 0 %
Lymphocytes Relative: 11 %
Lymphs Abs: 0.8 10*3/uL (ref 0.7–4.0)
MCH: 32.4 pg (ref 26.0–34.0)
MCHC: 33.5 g/dL (ref 30.0–36.0)
MCV: 96.7 fL (ref 80.0–100.0)
Monocytes Absolute: 0.8 10*3/uL (ref 0.1–1.0)
Monocytes Relative: 11 %
Neutro Abs: 5.8 10*3/uL (ref 1.7–7.7)
Neutrophils Relative %: 78 %
Platelets: 380 10*3/uL (ref 150–400)
RBC: 3.92 MIL/uL (ref 3.87–5.11)
RDW: 16.6 % — ABNORMAL HIGH (ref 11.5–15.5)
WBC: 7.4 10*3/uL (ref 4.0–10.5)
nRBC: 0 % (ref 0.0–0.2)

## 2019-07-17 LAB — COMPREHENSIVE METABOLIC PANEL
ALT: 28 U/L (ref 0–44)
AST: 37 U/L (ref 15–41)
Albumin: 2.3 g/dL — ABNORMAL LOW (ref 3.5–5.0)
Alkaline Phosphatase: 118 U/L (ref 38–126)
Anion gap: 8 (ref 5–15)
BUN: 16 mg/dL (ref 8–23)
CO2: 28 mmol/L (ref 22–32)
Calcium: 8.4 mg/dL — ABNORMAL LOW (ref 8.9–10.3)
Chloride: 92 mmol/L — ABNORMAL LOW (ref 98–111)
Creatinine, Ser: 0.76 mg/dL (ref 0.44–1.00)
GFR calc Af Amer: >60 mL/min (ref >60)
GFR calc non Af Amer: >60 mL/min (ref >60)
Glucose, Bld: 123 mg/dL — ABNORMAL HIGH (ref 70–99)
Potassium: 4.1 mmol/L (ref 3.5–5.1)
Sodium: 128 mmol/L — ABNORMAL LOW (ref 135–145)
Total Bilirubin: 0.9 mg/dL (ref 0.3–1.2)
Total Protein: 6.7 g/dL (ref 6.5–8.1)

## 2019-07-17 LAB — PHOSPHORUS: Phosphorus: 4.1 mg/dL (ref 2.5–4.6)

## 2019-07-17 LAB — MAGNESIUM: Magnesium: 2.2 mg/dL (ref 1.7–2.4)

## 2019-07-17 MED ORDER — GUAIFENESIN ER 600 MG PO TB12
600.0000 mg | ORAL_TABLET | Freq: Two times a day (BID) | ORAL | Status: DC | PRN
  Administered 2019-07-17: 12:00:00 600 mg via ORAL
  Filled 2019-07-17: qty 1

## 2019-07-17 MED ORDER — SENNOSIDES-DOCUSATE SODIUM 8.6-50 MG PO TABS
2.0000 | ORAL_TABLET | Freq: Two times a day (BID) | ORAL | Status: DC
  Administered 2019-07-17 – 2019-07-18 (×3): 2 via ORAL
  Filled 2019-07-17 (×5): qty 2

## 2019-07-17 MED ORDER — SODIUM CHLORIDE 3 % IN NEBU
4.0000 mL | INHALATION_SOLUTION | Freq: Three times a day (TID) | RESPIRATORY_TRACT | Status: DC
  Administered 2019-07-17 – 2019-07-19 (×6): 4 mL via RESPIRATORY_TRACT
  Filled 2019-07-17 (×7): qty 4

## 2019-07-17 MED ORDER — SODIUM CHLORIDE 3 % IN NEBU
4.0000 mL | INHALATION_SOLUTION | Freq: Three times a day (TID) | RESPIRATORY_TRACT | Status: DC
  Administered 2019-07-17: 16:00:00 4 mL via RESPIRATORY_TRACT
  Filled 2019-07-17 (×2): qty 4

## 2019-07-17 MED ORDER — POLYETHYLENE GLYCOL 3350 17 G PO PACK
17.0000 g | PACK | Freq: Every day | ORAL | Status: DC
  Filled 2019-07-17: qty 1

## 2019-07-17 MED ORDER — ONDANSETRON HCL 4 MG/2ML IJ SOLN: 4.0000 mg | Freq: Four times a day (QID) | INTRAMUSCULAR | Status: DC | PRN

## 2019-07-17 NOTE — Progress Notes (Addendum)
PROGRESS NOTE  [name] HEN:[id number] DOB: 08-03-1932 DOA: 07/14/2019 PCP: [name] Caraway, MD  HPI/Recap of past 75 hours: 86 year old female with history of recurrent non-small cell lung cancer currently on immunotherapy nivolumab, previous history of radiation, resection and chemo, recent history of stress cardiomyopathy,, A. fib, multifocal atrial tachycardia, protein calorie malnutrition and recurrent hospitalization, recent COVID-19 infection presented to the emergency room with shortness of breath and wheezing.  Patient recently had cardiac cath, no major coronary artery disease, EF 35%, discharged home on Lasix.  In the emergency room, hemodynamically stable.  Potassium 2.7.  Troponins normal.  Had variable tachycardia with some multifocal atrial tachycardia.  Chest x-ray shows bilateral perihilar prominence, no pleural effusion.  BNP was 452.  Admitted with IV Lasix.  07/17/19: Seen and examined with her daughter at bedside.  Diffuse rhonchorous sounds noted on exam.  Somnolent but easily arousable to voices.  Denies any pain.  Assessment/Plan: Principal Problem:   Acute respiratory failure (HCC) Active Problems:   Bilateral lung cancer (HCC)   Chronic systolic heart failure (HCC)   Takotsubo cardiomyopathy   Multifocal atrial tachycardia (HCC)   Protein-calorie malnutrition, severe  acute respiratory failure with hypoxia: Multifactorial.  With underlying structural lung disease and chronic lung issues including lobectomy x2, radiation of the lung.  Also has component of congestive heart failure. Patient has significant wheezing and coarse crackles that is probably due to a structural lung disease. No evidence of active infection. Start aggressive chest physiotherapy, bronchodilator therapy with Xopenex to avoid tachycardia, deep breathing exercises, incentive spirometry and flutter valve therapy.  Mobilize. Will add low-dose prednisone to try if that helps with her  bronchospasms.  Chronic diastolic CHF: 2D echo done on 07/16/2019 showed improved LVEF 50 to 55% from 35%.   Continue current management.  Continue strict I's and O's and daily weight Net I&O -1.5 L  Euvolemic hyponatremia, suspect SIADH with underlying lung cancer Sodium 128 We will hold off on IV fluid due to systolic heart failure. Trend sodium level  bilateral lung cancer: Since 2008.  Multiple surgeries chemo and radiation and currently on immunotherapy.  Followed by Dr. Earlie [name].  Due for second immunotherapy this week, however she is hospitalized.  She will follow up outpatient.  Personally reviewed chest x-ray which showed hyperinflated lungs with stable mass and left lung opacities.  delirium:  Currently improved.  Using some Zoloft at night that she will continue.  Delirium precautions.  Frequent orientation.  Allow patient's daughter to stay with her.  severe protein calorie malnutrition: Nutrition Status: Nutrition Problem: Severe Malnutrition Etiology: chronic illness(stage IV lung cancer) Signs/Symptoms: moderate fat depletion, severe fat depletion, moderate muscle depletion, severe muscle depletion Interventions: Ensure Enlive (each supplement provides 350kcal and 20 grams of protein), MVI, Magic cup  paroxysmal A. fib: Currently rate controlled.  She had episodes of multifocal atrial tachycardia.  Aggressive electrolyte replacement to keep potassium more than 4, magnesium more than 2.  Therapeutic on Eliquis.  advance care planning: Given her bilateral lung cancer, poor performance status, severe malnutrition with BMI 18 and recurrent hospitalization, I discussed with patient and her daughter Ms. [name] at bedside and suggested them to discuss planning, goal of care discussions. They agree with palliative care team consultation.   Palliative care team and family will meet today.    DVT prophylaxis: Eliquis Code Status: DNR (changed on 07/17/19) Family  Communication: Daughter at bedside Disposition Plan: patient is from home. Anticipated DC to home, Barriers to discharge active symptoms,  short of breath, wheezing and fluid overload.   Consultants:   Palliative care medicine  Procedures:   None  Antimicrobials:   None     Objective: Vitals:   07/16/19 1953 07/16/19 2018 07/17/19 0558 07/17/19 0603  BP:  136/71 (!) 161/79   Pulse:  71 73 70  Resp:  19 19   Temp:  98.2 F (36.8 C) 97.8 F (36.6 C)   TempSrc:  Oral Oral   SpO2: 96% 92% (!) 86% 91%  Weight:    43.9 kg  Height:        Intake/Output Summary (Last 24 hours) at 07/17/2019 0732 Last data filed at 07/17/2019 3329 Gross per 24 hour  Intake 225 ml  Output 1125 ml  Net -900 ml   Filed Weights   07/15/19 0309 07/16/19 0342 07/17/19 0603  Weight: 46.2 kg 45 kg 43.9 kg    Exam:  . General: 86 y.o. year-old female frail-appearing in no acute distress.  Somnolent but easily arousable to voices. . Cardiovascular: Regular rate and rhythm with no rubs or gallops.  No thyromegaly or JVD noted.   Marland Kitchen Respiratory: Diffuse rales bilaterally no wheezing noted.  Poor inspiratory effort. . Abdomen: Soft nontender nondistended with normal bowel sounds x4 quadrants. . Musculoskeletal: No lower extremity edema. 2/4 pulses in all 4 extremities. Marland Kitchen Psychiatry: Mood is appropriate for condition and setting   Data Reviewed: CBC: Recent Labs  Lab 07/14/19 2206 07/16/19 0446  WBC 9.3 7.7  NEUTROABS 7.9*  --   HGB 12.6 12.7  HCT 37.1 37.4  MCV 96.9 96.6  PLT 336 518   Basic Metabolic Panel: Recent Labs  Lab 07/14/19 2206 07/16/19 0446 07/16/19 1104  NA 125* 128* 128*  K 2.7* 6.0* 4.1  CL 85* 95* 91*  CO2 26 25 26   GLUCOSE 183* 148* 138*  BUN 14 14 13   CREATININE 0.60 0.78 0.72  CALCIUM 8.0* 8.9 8.3*  MG  --  1.8  --   PHOS  --   --  3.5   GFR: Estimated Creatinine Clearance: 36.3 mL/min (by C-G formula based on SCr of 0.72 mg/dL). Liver Function  Tests: Recent Labs  Lab 07/14/19 2206  AST 34  ALT 31  ALKPHOS 126  BILITOT 0.8  PROT 6.7  ALBUMIN 2.4*   No results for input(s): LIPASE, AMYLASE in the last 168 hours. No results for input(s): AMMONIA in the last 168 hours. Coagulation Profile: Recent Labs  Lab 07/14/19 2206  INR 1.2   Cardiac Enzymes: No results for input(s): CKTOTAL, CKMB, CKMBINDEX, TROPONINI in the last 168 hours. BNP (last 3 results) No results for input(s): PROBNP in the last 8760 hours. HbA1C: No results for input(s): HGBA1C in the last 72 hours. CBG: No results for input(s): GLUCAP in the last 168 hours. Lipid Profile: No results for input(s): CHOL, HDL, LDLCALC, TRIG, CHOLHDL, LDLDIRECT in the last 72 hours. Thyroid Function Tests: No results for input(s): TSH, T4TOTAL, FREET4, T3FREE, THYROIDAB in the last 72 hours. Anemia Panel: No results for input(s): VITAMINB12, FOLATE, FERRITIN, TIBC, IRON, RETICCTPCT in the last 72 hours. Urine analysis:    Component Value Date/Time   COLORURINE YELLOW 07/14/2019 2340   APPEARANCEUR HAZY (A) 07/14/2019 2340   LABSPEC 1.012 07/14/2019 2340   LABSPEC 1.030 03/17/2012 1051   PHURINE 6.0 07/14/2019 2340   GLUCOSEU 50 (A) 07/14/2019 2340   HGBUR NEGATIVE 07/14/2019 2340   BILIRUBINUR NEGATIVE 07/14/2019 2340   BILIRUBINUR Negative 03/17/2012 1051   KETONESUR NEGATIVE  07/14/2019 2340   PROTEINUR 30 (A) 07/14/2019 2340   NITRITE NEGATIVE 07/14/2019 2340   LEUKOCYTESUR TRACE (A) 07/14/2019 2340   LEUKOCYTESUR Negative 03/17/2012 1051   Sepsis Labs: @LABRCNTIP (procalcitonin:4,lacticidven:4)  ) Recent Results (from the past 240 hour(s))  SARS CORONAVIRUS 2 (TAT 6-24 HRS) Nasopharyngeal Nasopharyngeal Swab     Status: None   Collection Time: 07/15/19  2:22 AM   Specimen: Nasopharyngeal Swab  Result Value Ref Range Status   SARS Coronavirus 2 NEGATIVE NEGATIVE Final    Comment: (NOTE) SARS-CoV-2 target nucleic acids are NOT DETECTED. The SARS-CoV-2  RNA is generally detectable in upper and lower respiratory specimens during the acute phase of infection. Negative results do not preclude SARS-CoV-2 infection, do not rule out co-infections with other pathogens, and should not be used as the sole basis for treatment or other patient management decisions. Negative results must be combined with clinical observations, patient history, and epidemiological information. The expected result is Negative. Fact Sheet for Patients: SugarRoll.be Fact Sheet for Healthcare Providers: https://www.woods-mathews.com/ This test is not yet approved or cleared by the [place] FDA and  has been authorized for detection and/or diagnosis of SARS-CoV-2 by FDA under an Emergency Use Authorization (EUA). This EUA will remain  in effect (meaning this test can be used) for the duration of the COVID-19 declaration under Section 56 4(b)(1) of the Act, 21 U.S.C. section 360bbb-3(b)(1), unless the authorization is terminated or revoked sooner. Performed at [facility], [place] [address] [place],  [address]       Studies: ECHOCARDIOGRAM LIMITED  Result Date: 07/16/2019    ECHOCARDIOGRAM LIMITED REPORT   Patient Name:   TASHIRA TORRE Date of Exam: 07/16/2019 Medical Rec #:  [id number]        Height:       62.0 in Accession #:    [id number]       Weight:       99.1 lb Date of Birth:  07/10/1932         BSA:          1.42 m Patient Age:    22 years         BP:           145/64 mmHg Patient Gender: F                HR:           71 bpm. Exam Location:  Inpatient Procedure: Limited Echo, Color Doppler and Cardiac Doppler Indications:    V37.48 Acute systolic (congestive) heart failure  History:        Patient has prior history of Echocardiogram examinations, most                 recent 06/24/2019. Takotsubo Cardiomyopathy, Bilateral Lung                 Cancer, COVID+ 06/02/19.  Sonographer:    Raquel Sarna Senior RDCS  Referring Phys: [id number] [facility]  Sonographer Comments: Technically difficult due to small rib spaces. IMPRESSIONS  1. No definity used Endocardial border definition poor but EF appears significantly improved since echo done 06/24/19 . Left ventricular ejection fraction, by estimation, is 55 to 60%. The left ventricle has normal function. The left ventricle has no regional wall motion abnormalities.  2. Right ventricular systolic function is normal. The right ventricular size is normal. There is normal pulmonary artery systolic pressure.  3. The mitral valve is normal in structure and function. Trivial  mitral valve regurgitation. No evidence of mitral stenosis.  4. The aortic valve is tricuspid. Aortic valve regurgitation is not visualized. Mild to moderate aortic valve sclerosis/calcification is present, without any evidence of aortic stenosis. FINDINGS  Left Ventricle: No definity used Endocardial border definition poor but EF appears significantly improved since echo done 06/24/19. Left ventricular ejection fraction, by estimation, is 55 to 60%. The left ventricle has normal function. The left ventricle has no regional wall motion abnormalities. The left ventricular internal cavity size was normal in size. There is no left ventricular hypertrophy. Right Ventricle: The right ventricular size is normal. No increase in right ventricular wall thickness. Right ventricular systolic function is normal. There is normal pulmonary artery systolic pressure. The tricuspid regurgitant velocity is 2.02 m/s, and  with an assumed right atrial pressure of 8 mmHg, the estimated right ventricular systolic pressure is 34.7 mmHg. Left Atrium: Left atrial size was normal in size. Right Atrium: Right atrial size was normal in size. Pericardium: There is no evidence of pericardial effusion. Mitral Valve: The mitral valve is normal in structure and function. There is mild thickening of the mitral valve leaflet(s). Normal mobility of  the mitral valve leaflets. Trivial mitral valve regurgitation. No evidence of mitral valve stenosis. Tricuspid Valve: The tricuspid valve is normal in structure. Tricuspid valve regurgitation is not demonstrated. No evidence of tricuspid stenosis. Aortic Valve: The aortic valve is tricuspid. Aortic valve regurgitation is not visualized. Mild to moderate aortic valve sclerosis/calcification is present, without any evidence of aortic stenosis. Pulmonic Valve: The pulmonic valve was normal in structure. Pulmonic valve regurgitation is not visualized. No evidence of pulmonic stenosis. Aorta: The aortic root is normal in size and structure. IAS/Shunts: No atrial level shunt detected by color flow Doppler.  LEFT VENTRICLE PLAX 2D LVIDd:         2.90 cm LVIDs:         1.50 cm LV PW:         1.10 cm LV IVS:        1.00 cm LVOT diam:     1.60 cm LV SV Index:   18.74 LVOT Area:     2.01 cm  RIGHT VENTRICLE RV S prime:     14.10 cm/s TAPSE (M-mode): 2.0 cm LEFT ATRIUM         Index LA diam:    3.40 cm 2.40 cm/m   AORTA Ao Root diam: 2.30 cm TRICUSPID VALVE TR Peak grad:   16.3 mmHg TR Vmax:        202.00 cm/s  SHUNTS Systemic Diam: 1.60 cm Jenkins Rouge MD Electronically signed by Jenkins Rouge MD Signature Date/Time: 07/16/2019/11:57:11 AM    Final     Scheduled Meds: . amiodarone  200 mg Oral Daily  . apixaban  2.5 mg Oral BID  . Chlorhexidine Gluconate Cloth  6 each Topical Daily  . feeding supplement (ENSURE ENLIVE)  237 mL Oral BID BM  . furosemide  40 mg Intravenous Daily  . ipratropium-albuterol  3 mL Inhalation BID  . metoprolol succinate  25 mg Oral Daily  . multivitamin with minerals  1 tablet Oral Daily  . predniSONE  20 mg Oral Q breakfast  . sertraline  25 mg Oral Daily  . sodium chloride flush  3 mL Intravenous Q12H    Continuous Infusions: . sodium chloride       LOS: 2 days     Kayleen Memos, MD [facility] Pager [phone]  If 7PM-7AM, please contact  night-coverage [email or web address] Password [facility] 07/17/2019, 7:32 AM

## 2019-07-17 NOTE — Consult Note (Addendum)
Consultation Note Date: 07/17/2019   Patient Name: [name]  DOB: 08/10/1932  MRN: [id number]  Age / Sex: 86 y.o., female  PCP: [name], MD Referring Physician: Kayleen Memos, DO  Reason for Consultation: Establishing goals of care  HPI/Patient Profile: 86 y.o. female  with past medical history of recurrent non-small cell lung cancer currently on immunotherapy and s/p resection and chemotherapy, recent history of stress cardiomyopathy, afib, multifocal atrial tachycardia, DVT, protein calorie malnutrition, recent covid-19 infection admitted on 07/14/2019 with shortness of breath and wheezing. Patient recently had cardiac cath without major CAD, EF 35% now increased to 50-55% this admission. Hospital admission for acute respiratory failure with hypoxia, multifactorial with underlying history of lobectomy, radiation, and CHF. No evidence of active infection. Palliative medicine consultation for goals of care.   Clinical Assessment and Goals of Care:  I have reviewed medical records, discussed with care team and met with patient and daughter Sharyn Lull) at bedside to discuss diagnosis prognosis, [place], EOL wishes, disposition and options. Patient awake, alert, oriented and able to participate in [place] discussion.   Introduced Palliative Medicine as specialized medical care for people living with serious illness. It focuses on providing relief from the symptoms and stress of a serious illness. The goal is to improve quality of life for both the patient and the family.  We discussed a brief life review of the patient. Prior to hospitalization, patient living home alone but two daughters are very supportive and actively involved in her care. Daughter [name]) lives right next door and working from home, so available during daytime hours and Sharyn Lull is available to stay with her at night.   Patient was diagnosed  with lung cancer in 2008. Followed by Dr. Julien Nordmann and currently receiving immunotherapy every 3 weeks. Patient shares her frustrations with being in and out of the hospital since October, hospitalized four times (hip fracture, DVT, covid, fluid overload).   Discussed in detail course of hospitalization including diagnoses, interventions, plan of care and reviewed notes. Reviewed oncology notes. Discussed concerns with malnutrition and RD recommendations.   I attempted to elicit values and goals of care important to the patient and daughter. Advanced directives, concepts specific to code status, artifical feeding and hydration, and rehospitalization were considered and discussed.   Patient has a documented living will. Requested copy from daughter. Patient shares feelings if she is not going to get better, she would rather "let me go on." Daughter shares that last admit, she stated "I have lived a good life." Patient does not wish for prolonged, heroic interventions if nearing EOL. Patient/daughter acknowledge her health is declining, especially with recurrent hospitalization. Discussed that she will eventually reach a point where she no longer tolerates cancer treatments.   Patient shares that her sister died from cancer, and made the decision to forgo oncology treatments and instead, quality time at home with support of hospice. Her sister passed away peacefully in her home. Patient is considering her goals and wishes.   Discussed code status in detail  and medical recommendation against resuscitation/life support as her health continues to decline, with underlying age, frailty (break ribs), chronic conditions, and fear that if she survived resuscitative efforts, quality of life would be poor.   The difference between aggressive medical intervention and comfort care was considered in light of the patient's goals of care.   Patient is ready to complete MOST form. She wishes for DNR/DNI code status,  limited additional interventions including rehospitalization, IVF/ABX for time trial, and NO feeding tube. Patient able to sign MOST form. Electronic [facility] MOST form completed. Durable DNR completed. Copies made for chart and daughter. Daughter understands and respects her mother's wishes.   Discussed outpatient home health/palliative versus home hospice services. At this point, patient wishes to continue f/u with cancer center. She is agreeable with home health and outpatient palliative referral understanding this can transition to hospice services at any time. Daughter also understands and agrees with her mother's decisions. Sharyn Lull and her sister [name] are able to be with their mother frequently for safe discharge plan home.   Questions and concerns were addressed.  Hard Choices booklet and PMT contact information given.     SUMMARY OF RECOMMENDATIONS    GOC completed with patient and daughter, Sharyn Lull.  Paper and electronic MOST form completed. Patient's wishes include: DNR/DNI, limited additional interventions including re-hospitalization and medical management, IVF/ABX for time trial, and NO feeding tube. Durable DNR completed.  Requested copy of patient's advance directive for EMR. Daughter plans to bring to [facility].  Continue current plan of care and medical management per attending.   Patient wishes to continue outpatient oncology follow-up.  Disposition plan: home with home health and outpatient palliative referral. Daughter prefers palliative services through [facility] (is she eligible for care connections/[facility]??). If not, second preference [facility]. TOC team notified.   Patient/daughter understand outpatient palliative can transition to hospice services when ready.    Code Status/Advance Care Planning:  DNR/DNI  Symptom Management:   Per attending  Palliative Prophylaxis:   Aspiration, Delirium Protocol, Oral Care and Turn  Reposition  Psycho-social/Spiritual:   Desire for further Chaplaincy support:yes  Additional Recommendations: Caregiving  Support/Resources, Compassionate Wean Education and Education on Hospice  Prognosis:   Unable to determine: Believe she is eligible for hospice when patient/family ready (Likely <6 month prognosis).   Discharge Planning: Home with Home Health: Outpatient palliative referral       Primary Diagnoses: Present on Admission: . Bilateral lung cancer ([place]) . Chronic systolic heart failure ([place]) . Takotsubo cardiomyopathy   I have reviewed the medical record, interviewed the patient and family, and examined the patient. The following aspects are pertinent.  Past Medical History:  Diagnosis Date  . Atrial fibrillation ([place])   . FHx: chemotherapy 2008&2011   4 cycles cisplatin,taxotere,2008& carboplatin and Alimta 2011  . History of migraine headaches   . Hypercholesterolemia   . lung ca dx'd 06/2006   rt and lt lung  . Lung cancer ([place]) 3/14/1   right- Adenocarcinoma w/bronchioalveolar features  . Radiation 11/01/15-11/14/15   right upper lobe 30 gray  . Radiation pneumonitis ([place]) 01/24/2016   Social History   Socioeconomic History  . Marital status: Widowed    Spouse name: Not on file  . Number of children: 2  . Years of education: Not on file  . Highest education level: Not on file  Occupational History  . Not on file  Tobacco Use  . Smoking status: Never Smoker  . Smokeless tobacco: Never Used  Substance and Sexual Activity  . Alcohol use: No  . Drug use: No  . Sexual activity: Not Currently  Other Topics Concern  . Not on file  Social History Narrative  . Not on file   Social Determinants of Health   Financial Resource Strain:   . Difficulty of Paying Living Expenses: Not on file  Food Insecurity:   . Worried About Charity fundraiser in the Last Year: Not on file  . Ran Out of Food in the Last Year: Not on file  Transportation Needs:    . Lack of Transportation (Medical): Not on file  . Lack of Transportation (Non-Medical): Not on file  Physical Activity:   . Days of Exercise per Week: Not on file  . Minutes of Exercise per Session: Not on file  Stress:   . Feeling of Stress : Not on file  Social Connections:   . Frequency of Communication with Friends and Family: Not on file  . Frequency of Social Gatherings with Friends and Family: Not on file  . Attends Religious Services: Not on file  . Active Member of Clubs or Organizations: Not on file  . Attends Archivist Meetings: Not on file  . Marital Status: Not on file   Family History  Problem Relation Age of Onset  . Hypertension Father    Scheduled Meds: . amiodarone  200 mg Oral Daily  . apixaban  2.5 mg Oral BID  . Chlorhexidine Gluconate Cloth  6 each Topical Daily  . feeding supplement (ENSURE ENLIVE)  237 mL Oral BID BM  . furosemide  40 mg Intravenous Daily  . ipratropium-albuterol  3 mL Inhalation BID  . metoprolol succinate  25 mg Oral Daily  . multivitamin with minerals  1 tablet Oral Daily  . polyethylene glycol  17 g Oral Daily  . predniSONE  20 mg Oral Q breakfast  . senna-docusate  2 tablet Oral BID  . sertraline  25 mg Oral Daily  . sodium chloride flush  3 mL Intravenous Q12H  . sodium chloride HYPERTONIC  4 mL Nebulization TID   Continuous Infusions: . sodium chloride     PRN Meds:.sodium chloride, acetaminophen **OR** acetaminophen, guaiFENesin, levalbuterol, ondansetron (ZOFRAN) IV, sodium chloride flush, traMADol Medications Prior to Admission:  Prior to Admission medications   Medication Sig Start Date End Date Taking? Authorizing Provider  albuterol (VENTOLIN HFA) 108 (90 Base) MCG/ACT inhaler Inhale 2 puffs into the lungs every 6 (six) hours as needed for wheezing or shortness of breath.   Yes [provider]  amiodarone (PACERONE) 200 MG tablet TAKE 1 TABLET BY MOUTH EVERY DAY 07/08/19  Yes Lorretta Harp, MD   apixaban (ELIQUIS) 2.5 MG TABS tablet Take 1 tablet (2.5 mg total) by mouth 2 (two) times daily. 03/16/19  Yes Laurence Slate M, [name]  furosemide (LASIX) 20 MG tablet Take 1 tablet (20 mg total) by mouth daily. 06/25/19  Yes Kathie Dike, MD  Ipratropium-Albuterol (COMBIVENT RESPIMAT) 20-100 MCG/ACT AERS respimat Inhale 1 puff into the lungs every 6 (six) hours.   Yes [provider]  metoprolol succinate (TOPROL-XL) 25 MG 24 hr tablet Take 1 tablet (25 mg total) by mouth daily. Take with or immediately following a meal.  Refills per cardiology or primary care 07/13/19  Yes Croitoru, Mihai, MD  Multiple Vitamin (MULTIVITAMIN WITH MINERALS) TABS tablet Take 1 tablet by mouth daily.   Yes [provider]  sertraline (ZOLOFT) 25 MG tablet Take 25 mg by  mouth daily. 07/02/19  Yes [provider]  vitamin C (ASCORBIC ACID) 500 MG tablet Take 500 mg by mouth daily.   Yes [provider]   Allergies  Allergen Reactions  . Simvastatin Other (See Comments)    Cluster migraines   Review of Systems  Constitutional: Positive for activity change and unexpected weight change.  Respiratory: Positive for shortness of breath.    Physical Exam Vitals and nursing note reviewed.  Constitutional:      General: She is awake.     Appearance: She is cachectic. She is ill-appearing.  HENT:     Head: Normocephalic and atraumatic.  Pulmonary:     Effort: No tachypnea, accessory muscle usage or respiratory distress.     Breath sounds: Wheezing present.  Abdominal:     Tenderness: There is no abdominal tenderness.  Skin:    General: Skin is warm and dry.  Neurological:     Mental Status: She is alert and oriented to person, place, and time.  Psychiatric:        Mood and Affect: Mood normal.        Speech: Speech normal.        Behavior: Behavior normal.        Cognition and Memory: Cognition normal.    Vital Signs: BP 121/71 (BP Location: Left Arm)   Pulse 67   Temp  98.4 F (36.9 C) (Oral)   Resp 20   Ht 5' 2" (1.575 m)   Wt 43.9 kg   SpO2 98%   BMI 17.69 kg/m  Pain Scale: 0-10   Pain Score: Asleep   SpO2: SpO2: 98 % O2 Device:SpO2: 98 % O2 Flow Rate: .O2 Flow Rate (L/min): 2 L/min  IO: Intake/output summary:   Intake/Output Summary (Last 24 hours) at 07/17/2019 1935 Last data filed at 07/17/2019 1500 Gross per 24 hour  Intake 3 ml  Output 950 ml  Net -947 ml    LBM: Last BM Date: 07/17/19 Baseline Weight: Weight: 45.4 kg Most recent weight: Weight: 43.9 kg     Palliative Assessment/Data: PPS 50%   Flowsheet Rows     Most Recent Value  Intake Tab  Referral Department  Hospitalist  Unit at Time of Referral  Cardiac/Telemetry Unit  Palliative Care Primary Diagnosis  Cancer  Palliative Care Type  New Palliative care  Reason for referral  Clarify Goals of Care  Date first seen by Palliative Care  07/17/19  Clinical Assessment  Palliative Performance Scale Score  50%  Psychosocial & Spiritual Assessment  Palliative Care Outcomes  Patient/Family meeting held?  Yes  Who was at the meeting?  patient and daughter  Palliative Care Outcomes  Clarified goals of care, Counseled regarding hospice, Provided end of life care assistance, Provided advance care planning, Provided psychosocial or spiritual support, Changed CPR status, Completed durable DNR, Linked to palliative care logitudinal support, ACP counseling assistance      Time In: 1000 Time Out: 1130 Time Total: 90 Greater than 50%  of this time was spent counseling and coordinating care related to the above assessment and plan.  Signed by:  Ihor Dow, DNP, FNP-C Palliative Medicine Team  Phone: [phone] Fax: [phone]   Please contact Palliative Medicine Team phone at [phone] for questions and concerns.  For individual provider: See Shea Evans

## 2019-07-18 DIAGNOSIS — J9621 Acute and chronic respiratory failure with hypoxia: Secondary | ICD-10-CM

## 2019-07-18 LAB — BASIC METABOLIC PANEL
Anion gap: 8 (ref 5–15)
BUN: 20 mg/dL (ref 8–23)
CO2: 28 mmol/L (ref 22–32)
Calcium: 8.5 mg/dL — ABNORMAL LOW (ref 8.9–10.3)
Chloride: 92 mmol/L — ABNORMAL LOW (ref 98–111)
Creatinine, Ser: 0.7 mg/dL (ref 0.44–1.00)
GFR calc Af Amer: >60 mL/min (ref >60)
GFR calc non Af Amer: >60 mL/min (ref >60)
Glucose, Bld: 96 mg/dL (ref 70–99)
Potassium: 3.5 mmol/L (ref 3.5–5.1)
Sodium: 128 mmol/L — ABNORMAL LOW (ref 135–145)

## 2019-07-18 LAB — PHOSPHORUS: Phosphorus: 2.9 mg/dL (ref 2.5–4.6)

## 2019-07-18 LAB — MAGNESIUM: Magnesium: 1.9 mg/dL (ref 1.7–2.4)

## 2019-07-18 MED ORDER — AMIODARONE HCL 100 MG PO TABS
100.0000 mg | ORAL_TABLET | Freq: Every day | ORAL | Status: DC
  Administered 2019-07-19: 100 mg via ORAL
  Filled 2019-07-18: qty 1

## 2019-07-18 MED ORDER — BENZONATATE 100 MG PO CAPS
100.0000 mg | ORAL_CAPSULE | Freq: Three times a day (TID) | ORAL | Status: DC
  Administered 2019-07-18 – 2019-07-19 (×2): 100 mg via ORAL
  Filled 2019-07-18 (×3): qty 1

## 2019-07-18 MED ORDER — LORATADINE 10 MG PO TABS
5.0000 mg | ORAL_TABLET | Freq: Every day | ORAL | Status: DC
  Administered 2019-07-18 – 2019-07-19 (×2): 5 mg via ORAL
  Filled 2019-07-18 (×2): qty 1

## 2019-07-18 MED ORDER — IPRATROPIUM-ALBUTEROL 0.5-2.5 (3) MG/3ML IN SOLN
3.0000 mL | Freq: Three times a day (TID) | RESPIRATORY_TRACT | Status: DC
  Administered 2019-07-18 – 2019-07-19 (×4): 3 mL via RESPIRATORY_TRACT
  Filled 2019-07-18 (×4): qty 3

## 2019-07-18 MED ORDER — ALBUTEROL SULFATE (2.5 MG/3ML) 0.083% IN NEBU: 2.5000 mg | INHALATION_SOLUTION | Freq: Four times a day (QID) | RESPIRATORY_TRACT | 0 refills | Status: AC | PRN

## 2019-07-18 MED ORDER — APIXABAN 2.5 MG PO TABS: 2.5000 mg | ORAL_TABLET | Freq: Two times a day (BID) | ORAL | 0 refills | Status: DC

## 2019-07-18 MED ORDER — ENSURE ENLIVE PO LIQD: 237.0000 mL | Freq: Two times a day (BID) | ORAL | 0 refills | Status: AC

## 2019-07-18 MED ORDER — PREDNISONE 10 MG PO TABS
10.0000 mg | ORAL_TABLET | Freq: Every day | ORAL | Status: DC
  Administered 2019-07-19: 10 mg via ORAL
  Filled 2019-07-18: qty 1

## 2019-07-18 MED ORDER — BENZONATATE 100 MG PO CAPS: 100.0000 mg | ORAL_CAPSULE | Freq: Three times a day (TID) | ORAL | 0 refills | Status: DC | PRN

## 2019-07-18 MED ORDER — AMIODARONE HCL 100 MG PO TABS: 100.0000 mg | ORAL_TABLET | Freq: Every day | ORAL | 0 refills | Status: DC

## 2019-07-18 MED ORDER — GUAIFENESIN ER 600 MG PO TB12
1200.0000 mg | ORAL_TABLET | Freq: Two times a day (BID) | ORAL | Status: DC
  Administered 2019-07-18 – 2019-07-19 (×3): 1200 mg via ORAL
  Filled 2019-07-18 (×3): qty 2

## 2019-07-18 MED ORDER — METOPROLOL SUCCINATE ER 25 MG PO TB24: 25.0000 mg | ORAL_TABLET | Freq: Every day | ORAL | 0 refills | Status: DC

## 2019-07-18 MED ORDER — PREDNISONE 10 MG PO TABS: 10.0000 mg | ORAL_TABLET | Freq: Every day | ORAL | 0 refills | Status: DC

## 2019-07-18 NOTE — Progress Notes (Addendum)
PROGRESS NOTE  [name] WJX:[id number] DOB: Nov 19, 1932 DOA: 07/14/2019 PCP: [name], MD  HPI/Recap of past 23 hours: 86 year old female with history of recurrent non-small cell lung cancer currently on immunotherapy nivolumab, previous history of radiation, radiation pneumonitis, resection and chemo, recent history of stress cardiomyopathy, A. fib, multifocal atrial tachycardia, brachial artery embolus, protein calorie malnutrition and recurrent hospitalization, recent COVID-19 infection presented to the emergency room with shortness of breath and wheezing.  Patient recently had cardiac cath, no major coronary artery disease, EF 35%, discharged home on Lasix.  In the emergency room, hemodynamically stable. Troponins normal.  Had variable tachycardia with some multifocal atrial tachycardia.  Chest x-ray shows bilateral perihilar prominence, no pleural effusion.  BNP was 452.  Admitted and started on IV Lasix 40 mg twice daily.  Repeated 2D echo showed LVEF 55-60%.  Chest x-ray showed persistent left sided opacities.  She is afebrile with no leukocytosis.  No evidence of active infective process.  07/18/19: Seen and examined with her daughter present at bedside.  Mainly reports bilateral ear fullness.  Started on loratadine.  Also semiproductive cough.  On pulmonary toilet.    Assessment/Plan: Principal Problem:   Acute on chronic respiratory failure (HCC) Active Problems:   Bilateral lung cancer (HCC)   Chronic systolic heart failure (HCC)   Takotsubo cardiomyopathy   Multifocal atrial tachycardia (HCC)   Protein-calorie malnutrition, severe   Palliative care by specialist   Weakness  Acute hypoxic respiratory failure likely multifactorial in the setting of bilateral recurrent lung cancer post radiation/lobectomy x2. Clinically improving Continue bronchodilators and pulmonary toilet  Continue low-dose prednisone. Home O2 eval prior to dc  Combined chronic and diastolic  CHF: 2D echo done on 07/16/2019 showed improved LVEF 55-60% from 21% grade 1 diastolic dysfunction.   Continue strict I's and O's and daily weight Net I&O -1.9 L Currently on diuretics IV Lasix 40mg  twice daily Also on amiodarone 200 mg daily Cardiology has been consulted to reevaluate cardiac medications in the setting of improvement of LVEF  Euvolemic hyponatremia, suspect SIADH with underlying lung cancer Sodium 128, has been consistent Asymptomatic  Bilateral lung cancer with recurrence: Since 2008.  Multiple surgeries chemo and radiation and currently on immunotherapy.  Followed by Dr. Earlie Server.  Due for second immunotherapy this week, however she is hospitalized.  She will follow up outpatient.  Personally reviewed chest x-ray which showed hyperinflated lungs with stable mass and left lung opacities.  Resolved delirium:  C/w Zoloft at night.  C/w Delirium precautions.  Frequent orientation.  Allow patient's daughter to stay with her.  Severe protein calorie malnutrition: Nutrition Status: Albumin 2.3 Nutrition Problem: Severe Malnutrition Etiology: chronic illness(stage IV lung cancer) Signs/Symptoms: moderate fat depletion, severe fat depletion, moderate muscle depletion, severe muscle depletion Interventions: Ensure Enlive (each supplement provides 350kcal and 20 grams of protein), MVI, Magic cup  Paroxysmal A. fib: Currently rate controlled.  On amiodarone for rhythm control  Aggressive electrolyte replacement to aid keep potassium more than 4, magnesium more than 2.  On Eliquis for CVA prevention.  Hx of covid 19 viral infection Positive covid 19 on 12/30 Outside quarantine window Continue bronchodilators  Advance care planning: Palliative care team following. Patient is DNR/DNI  Ambulatory dysfunction PT rec HHPT OT Continue PT OT with assistance and fall precautions TOC to assist with [facility] services   DVT prophylaxis: Eliquis Code Status: DNR (changed on  07/17/19) Family Communication: Daughter at bedside Disposition Plan: patient is from home. Anticipated DC to home,  Barriers to discharge hypoxia and pending cardiology evaluation and medication management.   Consultants:   Palliative care medicine  Cardiology 2/14  Procedures:   None  Antimicrobials:   None     Objective: Vitals:   07/17/19 1557 07/17/19 1938 07/17/19 2000 07/18/19 0400  BP:  112/65  125/77  Pulse: 67 65 65 61  Resp: 20 18 18 18   Temp:  97.9 F (36.6 C)  98.2 F (36.8 C)  TempSrc:  Oral  Oral  SpO2: 98% 97% 97% 95%  Weight:    44.5 kg  Height:        Intake/Output Summary (Last 24 hours) at 07/18/2019 0829 Last data filed at 07/17/2019 1500 Gross per 24 hour  Intake --  Output 350 ml  Net -350 ml   Filed Weights   07/16/19 0342 07/17/19 0603 07/18/19 0400  Weight: 45 kg 43.9 kg 44.5 kg    Exam:  . General: 86 y.o. year-old female Frail appearing in NAD. Alert and interactive . Cardiovascular: RR no rubs or gallops . Respiratory: Mild wheezing posteriorly and bilaterally.  . Abdomen: Soft NT ND NBS . Musculoskeletal: No LE edema Psychiatry: Mood is appropriate  Data Reviewed: CBC: Recent Labs  Lab 07/14/19 2206 07/16/19 0446 07/17/19 0619  WBC 9.3 7.7 7.4  NEUTROABS 7.9*  --  5.8  HGB 12.6 12.7 12.7  HCT 37.1 37.4 37.9  MCV 96.9 96.6 96.7  PLT 336 335 671   Basic Metabolic Panel: Recent Labs  Lab 07/14/19 2206 07/16/19 0446 07/16/19 1104 07/17/19 0619 07/18/19 0707  NA 125* 128* 128* 128* 128*  K 2.7* 6.0* 4.1 4.1 3.5  CL 85* 95* 91* 92* 92*  CO2 26 25 26 28 28   GLUCOSE 183* 148* 138* 123* 96  BUN 14 14 13 16 20   CREATININE 0.60 0.78 0.72 0.76 0.70  CALCIUM 8.0* 8.9 8.3* 8.4* 8.5*  MG  --  1.8  --  2.2 1.9  PHOS  --   --  3.5 4.1 2.9   GFR: Estimated Creatinine Clearance: 36.8 mL/min (by C-G formula based on SCr of 0.7 mg/dL). Liver Function Tests: Recent Labs  Lab 07/14/19 2206 07/17/19 0619  AST 34  37  ALT 31 28  ALKPHOS 126 118  BILITOT 0.8 0.9  PROT 6.7 6.7  ALBUMIN 2.4* 2.3*   No results for input(s): LIPASE, AMYLASE in the last 168 hours. No results for input(s): AMMONIA in the last 168 hours. Coagulation Profile: Recent Labs  Lab 07/14/19 2206  INR 1.2   Cardiac Enzymes: No results for input(s): CKTOTAL, CKMB, CKMBINDEX, TROPONINI in the last 168 hours. BNP (last 3 results) No results for input(s): PROBNP in the last 8760 hours. HbA1C: No results for input(s): HGBA1C in the last 72 hours. CBG: No results for input(s): GLUCAP in the last 168 hours. Lipid Profile: No results for input(s): CHOL, HDL, LDLCALC, TRIG, CHOLHDL, LDLDIRECT in the last 72 hours. Thyroid Function Tests: No results for input(s): TSH, T4TOTAL, FREET4, T3FREE, THYROIDAB in the last 72 hours. Anemia Panel: No results for input(s): VITAMINB12, FOLATE, FERRITIN, TIBC, IRON, RETICCTPCT in the last 72 hours. Urine analysis:    Component Value Date/Time   COLORURINE YELLOW 07/14/2019 2340   APPEARANCEUR HAZY (A) 07/14/2019 2340   LABSPEC 1.012 07/14/2019 2340   LABSPEC 1.030 03/17/2012 1051   PHURINE 6.0 07/14/2019 2340   GLUCOSEU 50 (A) 07/14/2019 2340   HGBUR NEGATIVE 07/14/2019 2340   BILIRUBINUR NEGATIVE 07/14/2019 2340   BILIRUBINUR Negative 03/17/2012  [place] 07/14/2019 2340   PROTEINUR 30 (A) 07/14/2019 2340   NITRITE NEGATIVE 07/14/2019 2340   LEUKOCYTESUR TRACE (A) 07/14/2019 2340   LEUKOCYTESUR Negative 03/17/2012 1051   Sepsis Labs: @LABRCNTIP (procalcitonin:4,lacticidven:4)  ) Recent Results (from the past 240 hour(s))  SARS CORONAVIRUS 2 (TAT 6-24 HRS) Nasopharyngeal Nasopharyngeal Swab     Status: None   Collection Time: 07/15/19  2:22 AM   Specimen: Nasopharyngeal Swab  Result Value Ref Range Status   SARS Coronavirus 2 NEGATIVE NEGATIVE Final    Comment: (NOTE) SARS-CoV-2 target nucleic acids are NOT DETECTED. The SARS-CoV-2 RNA is generally  detectable in upper and lower respiratory specimens during the acute phase of infection. Negative results do not preclude SARS-CoV-2 infection, do not rule out co-infections with other pathogens, and should not be used as the sole basis for treatment or other patient management decisions. Negative results must be combined with clinical observations, patient history, and epidemiological information. The expected result is Negative. Fact Sheet for Patients: SugarRoll.be Fact Sheet for Healthcare Providers: https://www.woods-mathews.com/ This test is not yet approved or cleared by the [place] FDA and  has been authorized for detection and/or diagnosis of SARS-CoV-2 by FDA under an Emergency Use Authorization (EUA). This EUA will remain  in effect (meaning this test can be used) for the duration of the COVID-19 declaration under Section 56 4(b)(1) of the Act, 21 U.S.C. section 360bbb-3(b)(1), unless the authorization is terminated or revoked sooner. Performed at [facility], [place] [address], [place], [place] [address]       Studies: No results found.  Scheduled Meds: . amiodarone  200 mg Oral Daily  . apixaban  2.5 mg Oral BID  . Chlorhexidine Gluconate Cloth  6 each Topical Daily  . feeding supplement (ENSURE ENLIVE)  237 mL Oral BID BM  . furosemide  40 mg Intravenous Daily  . guaiFENesin  1,200 mg Oral BID  . ipratropium-albuterol  3 mL Inhalation TID  . loratadine  5 mg Oral Daily  . metoprolol succinate  25 mg Oral Daily  . multivitamin with minerals  1 tablet Oral Daily  . polyethylene glycol  17 g Oral Daily  . predniSONE  20 mg Oral Q breakfast  . senna-docusate  2 tablet Oral BID  . sertraline  25 mg Oral Daily  . sodium chloride flush  3 mL Intravenous Q12H  . sodium chloride HYPERTONIC  4 mL Nebulization TID    Continuous Infusions: . sodium chloride       LOS: 3 days     Kayleen Memos, MD [facility] Pager [phone]  If 7PM-7AM, please contact night-coverage [email or web address] Password [facility] 07/18/2019, 8:29 AM

## 2019-07-18 NOTE — Consult Note (Addendum)
Cardiology Consultation:   Patient ID: [name] [id number]; 1932/07/16   Admit date: 07/14/2019 Date of Consult: 07/18/2019  Primary Care Provider: [name], MD Primary Cardiologist: Sanda Klein, MD  Primary Electrophysiologist:  None   Patient Profile:   [name] is a 86 y.o. female with a hx of COVID-19 infection (diagnosed on 06/03/2019), Stage 4 lung CA (s/p lobectomy, XRT and currently on immunotherapy), systolic heart failure (EF 35-40%), atrial flutter/fibrillation (CHA2DS2-VASc Score 3, on chronic anticoagulation with Eliquis), previous episode of ischemic right hand on 03/13/2019 (requiring embolectomy), migraine headaches and hyperlipidemia, who is being seen today for the evaluation of amio in setting of XRT pneumonitis, MAT at the request of Dr Nevada Crane.  History of Present Illness:   [name] was seen by Cards during her previous admission. She was here from 01/20-01/23/2021 for dyspnea, trop elevation, Afib. Cath w/ D2 80% (med rx) but EF 35% w/ WMA (?stress CM), spont conversion to SR, O2 sats 92% w/ exertion.  Pt admitted 02/11 with O2 sats in the 70s, multifactorial resp failure (lobectomy x 2 and pulm XRT plus CHF), MAT on amio.  She has been seen by palliative care and made DNR/DNI.  02/14, resp status improving and EF also improved from previous, now 55-60%, in Afib. Cards asked to see for MAT, use of amio in pt w/ lung issues.   [name] was initially doing well after discharge.  She would ambulate with one of her daughters.  A good day for her was walking about 30 feet.  She says she was not initially getting short of breath with this.  Her shortness of breath worsened and she described being lightheaded.  The family contacted our office on 2/8 and Toprol-XL was reduced from 50 mg down to 25 mg.  However, her shortness of breath worsened and she came to the hospital on 2/11.  Since being in the hospital, her respiratory status has improved.   However, she is still on oxygen (new to her).  She still gets very short of breath ambulating about 10 feet.  She is not have lower extremity edema.  She describes orthopnea which was severe and possible PND.  She has a cough but feels like she cannot cough up the secretions.  She has not had fevers or chills.  She has not had any chest pain.  She never has palpitations.  She never has any awareness of her heart rate.  Her appetite is very poor, nothing tastes good to her.  She is drinking shakes and smoothies, but eating very little.   Past Medical History:  Diagnosis Date  . Atrial fibrillation ([place])   . FHx: chemotherapy 2008&2011   4 cycles cisplatin,taxotere,2008& carboplatin and Alimta 2011  . History of migraine headaches   . Hypercholesterolemia   . lung ca dx'd 06/2006   rt and lt lung  . Lung cancer ([place]) 3/14/1   right- Adenocarcinoma w/bronchioalveolar features  . Radiation 11/01/15-11/14/15   right upper lobe 30 gray  . Radiation pneumonitis ([place]) 01/24/2016    Past Surgical History:  Procedure Laterality Date  . CATARACT EXTRACTION  2013   bilateral  . EMBOLECTOMY Right 03/13/2019   Procedure: RIGHT BRACHIAL, RADIAL, ULNAR EMBOLECTOMY;  Surgeon: Elam Dutch, MD;  Location: [facility] OR;  Service: Vascular;  Laterality: Right;  . left  lowerlung lobectomy Left 06/19/2006   Dr.Burney,Left lower lobectomy  . LEFT HEART CATH AND CORONARY ANGIOGRAPHY N/A 06/25/2019   Procedure: LEFT HEART CATH AND  CORONARY ANGIOGRAPHY;  Surgeon: Martinique, Peter M, MD;  Location: [facility];  Service: Cardiovascular;  Laterality: N/A;  . MYRINGOTOMY Bilateral 06/25/2016  . Porta-cath  2011     Prior to Admission medications   Medication Sig Start Date End Date Taking? Authorizing Provider  albuterol (VENTOLIN HFA) 108 (90 Base) MCG/ACT inhaler Inhale 2 puffs into the lungs every 6 (six) hours as needed for wheezing or shortness of breath.   Yes [provider]  amiodarone  (PACERONE) 200 MG tablet TAKE 1 TABLET BY MOUTH EVERY DAY 07/08/19  Yes Lorretta Harp, MD  apixaban (ELIQUIS) 2.5 MG TABS tablet Take 1 tablet (2.5 mg total) by mouth 2 (two) times daily. 03/16/19  Yes Laurence Slate M, [name]  furosemide (LASIX) 20 MG tablet Take 1 tablet (20 mg total) by mouth daily. 06/25/19  Yes Kathie Dike, MD  Ipratropium-Albuterol (COMBIVENT RESPIMAT) 20-100 MCG/ACT AERS respimat Inhale 1 puff into the lungs every 6 (six) hours.   Yes [provider]  metoprolol succinate (TOPROL-XL) 25 MG 24 hr tablet Take 1 tablet (25 mg total) by mouth daily. Take with or immediately following a meal.  Refills per cardiology or primary care 07/13/19  Yes Alwaleed Obeso, MD  Multiple Vitamin (MULTIVITAMIN WITH MINERALS) TABS tablet Take 1 tablet by mouth daily.   Yes [provider]  sertraline (ZOLOFT) 25 MG tablet Take 25 mg by mouth daily. 07/02/19  Yes [provider]  vitamin C (ASCORBIC ACID) 500 MG tablet Take 500 mg by mouth daily.   Yes [provider]    Inpatient Medications: Scheduled Meds: . amiodarone  200 mg Oral Daily  . apixaban  2.5 mg Oral BID  . Chlorhexidine Gluconate Cloth  6 each Topical Daily  . feeding supplement (ENSURE ENLIVE)  237 mL Oral BID BM  . furosemide  40 mg Intravenous Daily  . guaiFENesin  1,200 mg Oral BID  . ipratropium-albuterol  3 mL Inhalation TID  . loratadine  5 mg Oral Daily  . metoprolol succinate  25 mg Oral Daily  . multivitamin with minerals  1 tablet Oral Daily  . polyethylene glycol  17 g Oral Daily  . predniSONE  20 mg Oral Q breakfast  . senna-docusate  2 tablet Oral BID  . sertraline  25 mg Oral Daily  . sodium chloride flush  3 mL Intravenous Q12H  . sodium chloride HYPERTONIC  4 mL Nebulization TID   Continuous Infusions: . sodium chloride     PRN Meds: sodium chloride, acetaminophen **OR** acetaminophen, levalbuterol, ondansetron (ZOFRAN) IV, sodium chloride flush,  traMADol  Allergies:    Allergies  Allergen Reactions  . Simvastatin Other (See Comments)    Cluster migraines    Social History:   Social History   Socioeconomic History  . Marital status: Widowed    Spouse name: Not on file  . Number of children: 2  . Years of education: Not on file  . Highest education level: Not on file  Occupational History  . Not on file  Tobacco Use  . Smoking status: Never Smoker  . Smokeless tobacco: Never Used  Substance and Sexual Activity  . Alcohol use: No  . Drug use: No  . Sexual activity: Not Currently  Other Topics Concern  . Not on file  Social History Narrative  . Not on file   Social Determinants of Health   Financial Resource Strain:   . Difficulty of Paying Living Expenses: Not on file  Food  Insecurity:   . Worried About Charity fundraiser in the Last Year: Not on file  . Ran Out of Food in the Last Year: Not on file  Transportation Needs:   . Lack of Transportation (Medical): Not on file  . Lack of Transportation (Non-Medical): Not on file  Physical Activity:   . Days of Exercise per Week: Not on file  . Minutes of Exercise per Session: Not on file  Stress:   . Feeling of Stress : Not on file  Social Connections:   . Frequency of Communication with Friends and Family: Not on file  . Frequency of Social Gatherings with Friends and Family: Not on file  . Attends Religious Services: Not on file  . Active Member of Clubs or Organizations: Not on file  . Attends Archivist Meetings: Not on file  . Marital Status: Not on file  Intimate Partner Violence:   . Fear of Current or Ex-Partner: Not on file  . Emotionally Abused: Not on file  . Physically Abused: Not on file  . Sexually Abused: Not on file    Family History:   Family History  Problem Relation Age of Onset  . Hypertension Father    Family Status:  Family Status  Relation Name Status  . Father  (Not Specified)    ROS:  Please see the history of  present illness.  All other ROS reviewed and negative.     Physical Exam/Data:   Vitals:   07/17/19 1557 07/17/19 1938 07/17/19 2000 07/18/19 0400  BP:  112/65  125/77  Pulse: 67 65 65 61  Resp: 20 18 18 18   Temp:  97.9 F (36.6 C)  98.2 F (36.8 C)  TempSrc:  Oral  Oral  SpO2: 98% 97% 97% 95%  Weight:    44.5 kg  Height:        Intake/Output Summary (Last 24 hours) at 07/18/2019 0915 Last data filed at 07/17/2019 1500 Gross per 24 hour  Intake --  Output 350 ml  Net -350 ml   Filed Weights   07/16/19 0342 07/17/19 0603 07/18/19 0400  Weight: 45 kg 43.9 kg 44.5 kg   Body mass index is 17.94 kg/m.  General:  Well nourished, well developed, female in no acute distress HEENT: normal Lymph: no adenopathy Neck: JVD -mildly elevated Endocrine:  No thryomegaly Vascular: No carotid bruits; 4/4 extremity pulses 2+  Cardiac:  normal S1, S2; RRR; no murmur Lungs: Expiratory wheeze bilaterally, no wheezing, some rhonchi and rales (heard best anteriorly) Abd: soft, nontender, no hepatomegaly  Ext: no edema Musculoskeletal:  No deformities, BUE and BLE strength weak but equal Skin: warm and dry  Neuro:  CNs 2-12 intact, no focal abnormalities noted Psych:  Normal affect   EKG:  The EKG was personally reviewed and demonstrates: 2/12 ECG is sinus rhythm, heart rate 72, LAFB and LVH, QRS complexes are similar morphology to 06/23/2019 ECG, but T waves are much improved Telemetry:  Telemetry was personally reviewed and demonstrates: Sinus rhythm, on 2/12 she had several episodes of wide-complex tachycardia, atrial tach versus VT   CV studies:   ECHO: LIMITED 02/12/20201  1. No definity used Endocardial border definition poor but EF appears  significantly improved since echo done 06/24/19 . Left ventricular ejection  fraction, by estimation, is 55 to 60%. The left ventricle has normal  function. The left ventricle has no  regional wall motion abnormalities.   2. Right ventricular  systolic function is normal. The  right ventricular  size is normal. There is normal pulmonary artery systolic pressure.   3. The mitral valve is normal in structure and function. Trivial mitral  valve regurgitation. No evidence of mitral stenosis.   4. The aortic valve is tricuspid. Aortic valve regurgitation is not  visualized. Mild to moderate aortic valve sclerosis/calcification is  present, without any evidence of aortic stenosis.   ECHO: 06/24/2019  1. Left ventricular ejection fraction, by visual estimation, is 35%. The  left ventricle has moderately decreased function. There is no left  ventricular hypertrophy.   2. Left ventricular diastolic parameters are consistent with Grade I  diastolic dysfunction (impaired relaxation).   3. The left ventricle demonstrates regional wall motion abnormalities.  Unusual pattern of wall motion abnormalities. The basal to mid  inferoseptal, anteroseptal, inferior and inferolateral walls are akinetic.  Severe hypokinesis of the basal anterior and   basal anterolateral walls. Preservation of apical segments. Possible  "reverse Takotsubo" picture, but need to rule out coronary disease.   4. Global right ventricle has normal systolic function.The right  ventricular size is normal. No increase in right ventricular wall  thickness.   5. Left atrial size was normal.   6. Right atrial size was normal.   7. Mild mitral annular calcification.   8. The mitral valve is normal in structure. Mild mitral valve  regurgitation. No evidence of mitral stenosis.   9. The tricuspid valve is normal in structure. Tricuspid valve  regurgitation is trivial.  10. The aortic valve is tricuspid. Aortic valve regurgitation is not  visualized. Mild aortic valve sclerosis without stenosis.  11. TR signal is inadequate for assessing pulmonary artery systolic  pressure.  12. The inferior vena cava is normal in size with greater than 50%  respiratory variability, suggesting  right atrial pressure of 3 mmHg.   CATH: 06/25/2019  Prox LAD to Mid LAD lesion is 20% stenosed.  2nd Diag lesion is 80% stenosed.  LV end diastolic pressure is normal.   1. 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 Normal LVEDP   Plan: diagonal disease does not explain her clinical picture which is more c/w stress induced CM. Recommend medical management.    Laboratory Data:   Chemistry Recent Labs  Lab 07/16/19 1104 07/17/19 0619 07/18/19 0707  NA 128* 128* 128*  K 4.1 4.1 3.5  CL 91* 92* 92*  CO2 26 28 28   GLUCOSE 138* 123* 96  BUN 13 16 20   CREATININE 0.72 0.76 0.70  CALCIUM 8.3* 8.4* 8.5*  GFRNONAA >60 >60 >60  GFRAA >60 >60 >60  ANIONGAP 11 8 8     Lab Results  Component Value Date   ALT 28 07/17/2019   AST 37 07/17/2019   ALKPHOS 118 07/17/2019   BILITOT 0.9 07/17/2019   Hematology Recent Labs  Lab 07/14/19 2206 07/16/19 0446 07/17/19 0619  WBC 9.3 7.7 7.4  RBC 3.83* 3.87 3.92  HGB 12.6 12.7 12.7  HCT 37.1 37.4 37.9  MCV 96.9 96.6 96.7  MCH 32.9 32.8 32.4  MCHC 34.0 34.0 33.5  RDW 16.3* 16.8* 16.6*  PLT 336 335 380   Cardiac Enzymes High Sensitivity Troponin:   Recent Labs  Lab 06/23/19 1928 06/23/19 2137 07/14/19 2206 07/15/19 0106  TROPONINIHS 834* 2,935* 42* 231*      BNP Recent Labs  Lab 07/14/19 2206  BNP 452.2*    TSH:  Lab Results  Component Value Date   TSH 4.800 (H) 05/26/2019   Lipids: Lab Results  Component Value Date   CHOL 183 06/24/2019   HDL 70 06/24/2019   LDLCALC 104 (H) 06/24/2019   TRIG 47 06/24/2019   CHOLHDL 2.6 06/24/2019   HgbA1c:No results found for: HGBA1C Magnesium:  Magnesium  Date Value Ref Range Status  07/18/2019 1.9 1.7 - 2.4 mg/dL Final    Comment:    Performed at [facility], [place] [address], [place], [place] [address]     Radiology/Studies:  [place] CHEST PORT 1 VIEW  Result Date: 07/16/2019 CLINICAL DATA:  Shortness of breath EXAM: PORTABLE CHEST 1 VIEW  COMPARISON:  07/13/2010 FINDINGS: Cardiac shadow is stable. Persistent paramediastinal/paraspinal mass lesion the right is noted. Right chest wall port is noted at the cavoatrial junction. Aortic calcifications are again seen and stable. Postsurgical changes in the left upper lobe are noted. Patchy airspace opacities are again identified throughout the left lung. No sizable effusion is noted. IMPRESSION: Stable right-sided mass consistent with the given clinical history of lung carcinoma. Persistent left lung opacities. Electronically Signed   By: Inez Catalina M.D.   On: 07/16/2019 08:44   DG Chest Port 1 View  Result Date: 07/14/2019 CLINICAL DATA:  Short of breath, nausea and vomiting, lung cancer EXAM: PORTABLE CHEST 1 VIEW COMPARISON:  06/23/2019, 06/25/2019 FINDINGS: Single frontal view of the chest demonstrates masslike consolidation right upper lobe consistent with known lung cancer. Cardiac silhouette is stable. Right chest wall port unchanged. Since the prior exam, there is progression of the left-sided ground-glass airspace disease. No evidence pneumothorax. No acute bony abnormality. IMPRESSION: 1. Developing left-sided airspace disease consistent with asymmetric edema or pneumonia. 2. Right upper lobe mass consistent with known lung cancer. Electronically Signed   By: Randa Ngo M.D.   On: 07/14/2019 22:30   ECHOCARDIOGRAM LIMITED  Result Date: 07/16/2019    ECHOCARDIOGRAM LIMITED REPORT   Patient Name:   [name] Date of Exam: 07/16/2019 Medical Rec #:  [id number]        Height:       62.0 in Accession #:    [id number]       Weight:       99.1 lb Date of Birth:  11/02/1932         BSA:          1.42 m Patient Age:    37 years         BP:           145/64 mmHg Patient Gender: F                HR:           71 bpm. Exam Location:  Inpatient Procedure: Limited Echo, Color Doppler and Cardiac Doppler Indications:    W29.93 Acute systolic (congestive) heart failure  History:        Patient  has prior history of Echocardiogram examinations, most                 recent 06/24/2019. Takotsubo Cardiomyopathy, Bilateral Lung                 Cancer, COVID+ 06/02/19.  Sonographer:    Raquel Sarna Senior RDCS Referring Phys: [id number] [facility]  Sonographer Comments: Technically difficult due to small rib spaces. IMPRESSIONS  1. No definity used Endocardial border definition poor but EF appears significantly improved since echo done 06/24/19 . Left ventricular ejection fraction, by estimation, is 55 to 60%. The left ventricle has normal function. The left ventricle has no  regional wall motion abnormalities.  2. Right ventricular systolic function is normal. The right ventricular size is normal. There is normal pulmonary artery systolic pressure.  3. The mitral valve is normal in structure and function. Trivial mitral valve regurgitation. No evidence of mitral stenosis.  4. The aortic valve is tricuspid. Aortic valve regurgitation is not visualized. Mild to moderate aortic valve sclerosis/calcification is present, without any evidence of aortic stenosis. FINDINGS  Left Ventricle: No definity used Endocardial border definition poor but EF appears significantly improved since echo done 06/24/19. Left ventricular ejection fraction, by estimation, is 55 to 60%. The left ventricle has normal function. The left ventricle has no regional wall motion abnormalities. The left ventricular internal cavity size was normal in size. There is no left ventricular hypertrophy. Right Ventricle: The right ventricular size is normal. No increase in right ventricular wall thickness. Right ventricular systolic function is normal. There is normal pulmonary artery systolic pressure. The tricuspid regurgitant velocity is 2.02 m/s, and  with an assumed right atrial pressure of 8 mmHg, the estimated right ventricular systolic pressure is 19.4 mmHg. Left Atrium: Left atrial size was normal in size. Right Atrium: Right atrial size was normal in  size. Pericardium: There is no evidence of pericardial effusion. Mitral Valve: The mitral valve is normal in structure and function. There is mild thickening of the mitral valve leaflet(s). Normal mobility of the mitral valve leaflets. Trivial mitral valve regurgitation. No evidence of mitral valve stenosis. Tricuspid Valve: The tricuspid valve is normal in structure. Tricuspid valve regurgitation is not demonstrated. No evidence of tricuspid stenosis. Aortic Valve: The aortic valve is tricuspid. Aortic valve regurgitation is not visualized. Mild to moderate aortic valve sclerosis/calcification is present, without any evidence of aortic stenosis. Pulmonic Valve: The pulmonic valve was normal in structure. Pulmonic valve regurgitation is not visualized. No evidence of pulmonic stenosis. Aorta: The aortic root is normal in size and structure. IAS/Shunts: No atrial level shunt detected by color flow Doppler.  LEFT VENTRICLE PLAX 2D LVIDd:         2.90 cm LVIDs:         1.50 cm LV PW:         1.10 cm LV IVS:        1.00 cm LVOT diam:     1.60 cm LV SV Index:   18.74 LVOT Area:     2.01 cm  RIGHT VENTRICLE RV S prime:     14.10 cm/s TAPSE (M-mode): 2.0 cm LEFT ATRIUM         Index LA diam:    3.40 cm 2.40 cm/m   AORTA Ao Root diam: 2.30 cm TRICUSPID VALVE TR Peak grad:   16.3 mmHg TR Vmax:        202.00 cm/s  SHUNTS Systemic Diam: 1.60 cm Jenkins Rouge MD Electronically signed by Jenkins Rouge MD Signature Date/Time: 07/16/2019/11:57:11 AM    Final     Assessment and Plan:   1.  Arrhythmia: -We will review telemetry with MD to make sure that it is atrial and not ventricular tachycardia episodes -She has been compliant with amiodarone since her previous admission she has been on amiodarone for almost a month, but still may not have had a full 10 g load.  Discussed with MD temporary increase in dosing -Beta-blocker was decreased due to the lightheaded feeling.  Heart rate is 50s at times on the current dose, so no  increase -Asymptomatic -On Eliquis   2.  CHF: -Her EF has  recovered.  No wall motion abnormalities are mentioned in the echo report this admission. -No significant volume overload on exam  Otherwise, per IM Principal Problem:   Acute on chronic respiratory failure (HCC) Active Problems:   Bilateral lung cancer (HCC)   Chronic systolic heart failure (HCC)   Takotsubo cardiomyopathy   Multifocal atrial tachycardia (HCC)   Protein-calorie malnutrition, severe   Palliative care by specialist   Weakness   For questions or updates, please contact [facility] Please consult [email or web address] for contact info under Cardiology/STEMI.   Signed, Rosaria Ferries, [name]  07/18/2019 9:15 AM  I have seen and examined the patient along with Rosaria Ferries, [name]   I have reviewed the chart, notes and new data.  I agree with PA/NP's note.  Key new complaints: cough, minimally productive, wheezing, dyspnea with minimal activity. Had dizziness last week, better. Key examination changes: cachectic, bilateral wheezing Key new findings / data: Echo shows complete normalization of LVEF and wall motion (c/w resolved takotsubo sd.). CXR with prominent R hilar mass and nonspecific changes left lung, not suggestive of CHF. On telemetry, I believe that the wide QRS tachycardia represents paroxysmal atrial tachycardia with aberrancy that worsens as the rate increases. Sinus bradycardia limits increased doses of beta blocker or amiodarone.   PLAN: Very thin and current dose of amio probably represents a "loading dose" for her. Plan to reduce to 100 mg daily at discharge, unless telemetry shows worsening atrial arrhythmia while here. Stop diuretics. LVEF fully recovered. Probably needs home O2. Malnourished. Encouraged increased intake of calories and protein.  Sanda Klein, MD, [place] [phone] 07/18/2019, 10:37 AM

## 2019-07-18 NOTE — Progress Notes (Signed)
Physical Therapy Treatment Patient Details Name: [name] MRN: [id number] DOB: 03-13-1933 Today's Date: 07/18/2019    History of Present Illness 86yo female with recent hospitalization due to possible Takotsubo caridomyopathy with CHF/rapid A-fib and discharged home on room air. Now complaining of extreme SOB with ambulation, found to be in 70s on room air. PE negative. PMH A-fib, hx lung CA with radiation and chemo, embolectomy, cardaic cath    PT Comments    Pt remains limited in activity tolerance by SOB and fatigue. Pt is only able to tolerate short bouts of physical activity due to increased work of breathing and SOB. Pt requires 6L [place] during mobility to maintain oxygen saturation at or above 92% and 2L [place] at rest. Pt demonstrates one instance of knee buckling and is generally unsteady during gait, requiring minA to maintain balance. PT discusses possible SNF recommendation and patient declines, reporting she wants to discharge home with the assistance of her daughters (pt's daughter expresses similar opinion upon this PT's previous treatment session). Pt will benefit from continued acute PT POC to improve activity tolerance and reduce falls risk.   Follow Up Recommendations  Home health PT;Supervision/Assistance - 24 hour(pt refusing possible SNF recommendation)     Equipment Recommendations  3in1 (PT);Rolling walker with 5" wheels    Recommendations for Other Services       Precautions / Restrictions Precautions Precautions: Fall;Other (comment) Precaution Comments: watch SPO2 Restrictions Weight Bearing Restrictions: No    Mobility  Bed Mobility                  Transfers Overall transfer level: Needs assistance Equipment used: Rolling walker (2 wheeled) Transfers: Sit to/from Stand Sit to Stand: Min guard            Ambulation/Gait Ambulation/Gait assistance: Min assist Gait Distance (Feet): 25 Feet(2 trials of 25') Assistive device: Rolling  walker (2 wheeled) Gait Pattern/deviations: Step-to pattern Gait velocity: reduced Gait velocity interpretation: <1.8 ft/sec, indicate of risk for recurrent falls General Gait Details: pt with short step to gait, drifting to right side, one instance of R knee buckling corrected with minA   Stairs             Wheelchair Mobility    Modified Rankin (Stroke Patients Only)       Balance Overall balance assessment: Needs assistance Sitting-balance support: No upper extremity supported;Feet supported Sitting balance-Leahy Scale: Good Sitting balance - Comments: supervision   Standing balance support: Bilateral upper extremity supported Standing balance-Leahy Scale: Poor Standing balance comment: minG-minA with BUE support of RW                            Cognition Arousal/Alertness: Awake/alert Behavior During Therapy: WFL for tasks assessed/performed;Impulsive Overall Cognitive Status: Within Functional Limits for tasks assessed                                        Exercises      General Comments General comments (skin integrity, edema, etc.): Upon PT arrival pt on 3L [place], saturating well. PT weans pt to room air upon which pt desaturates to 87% at rest. PT places pt on 2L[place] at rest and pt saturates well, 92-94%. During ambulation pt desaturates on 2L [place] to 80%. Pt increases supplemental oxygen to 4L  for 2nd short walk, however pt desaturates to 86%,  only saturating at or above 92% with 6L [place] of oxygen.       Pertinent Vitals/Pain Pain Assessment: No/denies pain    Home Living                      Prior Function            PT Goals (current goals can now be found in the care plan section) Acute Rehab PT Goals Patient Stated Goal: go home with family support, improve breathing Progress towards PT goals: Progressing toward goals(very slowly, limited by fatigue)    Frequency    Min 3X/week      PT Plan Current plan  remains appropriate    Co-evaluation              AM-PAC PT "6 Clicks" Mobility   Outcome Measure  Help needed turning from your back to your side while in a flat bed without using bedrails?: None Help needed moving from lying on your back to sitting on the side of a flat bed without using bedrails?: None Help needed moving to and from a bed to a chair (including a wheelchair)?: A Little Help needed standing up from a chair using your arms (e.g., wheelchair or bedside chair)?: A Little Help needed to walk in hospital room?: A Little Help needed climbing 3-5 steps with a railing? : Total 6 Click Score: 18    End of Session Equipment Utilized During Treatment: Oxygen Activity Tolerance: Patient limited by fatigue Patient left: in chair;with call bell/phone within reach Nurse Communication: Mobility status PT Visit Diagnosis: Unsteadiness on feet (R26.81);History of falling (Z91.81);Muscle weakness (generalized) (M62.81);Hemiplegia and hemiparesis;Difficulty in walking, not elsewhere classified (R26.2)     Time: 0865-7846 PT Time Calculation (min) (ACUTE ONLY): 30 min  Charges:  $Gait Training: 23-37 mins                     Zenaida Niece, PT, DPT Acute Rehabilitation Pager: [phone]    Zenaida Niece 07/18/2019, 5:22 PM

## 2019-07-18 NOTE — Care Management (Signed)
Faxed [place] referral to [facility] at [phone]

## 2019-07-19 ENCOUNTER — Telehealth: Payer: Self-pay | Admitting: Internal Medicine

## 2019-07-19 ENCOUNTER — Encounter: Payer: Self-pay | Admitting: Internal Medicine

## 2019-07-19 DIAGNOSIS — I5042 Chronic combined systolic (congestive) and diastolic (congestive) heart failure: Secondary | ICD-10-CM

## 2019-07-19 DIAGNOSIS — Z7901 Long term (current) use of anticoagulants: Secondary | ICD-10-CM

## 2019-07-19 DIAGNOSIS — J441 Chronic obstructive pulmonary disease with (acute) exacerbation: Secondary | ICD-10-CM

## 2019-07-19 DIAGNOSIS — I480 Paroxysmal atrial fibrillation: Secondary | ICD-10-CM

## 2019-07-19 LAB — BASIC METABOLIC PANEL
Anion gap: 12 (ref 5–15)
BUN: 23 mg/dL (ref 8–23)
CO2: 28 mmol/L (ref 22–32)
Calcium: 8.4 mg/dL — ABNORMAL LOW (ref 8.9–10.3)
Chloride: 87 mmol/L — ABNORMAL LOW (ref 98–111)
Creatinine, Ser: 0.75 mg/dL (ref 0.44–1.00)
GFR calc Af Amer: >60 mL/min (ref >60)
GFR calc non Af Amer: >60 mL/min (ref >60)
Glucose, Bld: 174 mg/dL — ABNORMAL HIGH (ref 70–99)
Potassium: 3.4 mmol/L — ABNORMAL LOW (ref 3.5–5.1)
Sodium: 127 mmol/L — ABNORMAL LOW (ref 135–145)

## 2019-07-19 LAB — MAGNESIUM: Magnesium: 1.9 mg/dL (ref 1.7–2.4)

## 2019-07-19 MED ORDER — DULERA 100-5 MCG/ACT IN AERO: 2.0000 | INHALATION_SPRAY | Freq: Two times a day (BID) | RESPIRATORY_TRACT | 0 refills | Status: DC

## 2019-07-19 NOTE — TOC Progression Note (Addendum)
Transition of Care [facility]) - Progression Note    Patient Details  Name: [name] MRN: [id number] Date of Birth: 03/25/1933  Transition of Care [facility]) CM/SW Contact  Graves-Bigelow, Ocie Cornfield, RN Phone Number: 07/19/2019, 9:57 AM  Clinical Narrative:   Case Manager spoke with daugther regarding transition of care needs for home. Patient will need oxygen and nebulizer machine. Case Manager discussed with daughter if ok to use [employer] and response was yes. Case Manager called [employer] and referral made for above durable medical equipment (DME). DME will be delivered to the room prior to transition home. Pharmacy of choice is [facility]. Patient has DME Rolling walker, wheelchair and 3n1 in the home. Previous Weekend Tourist information centre manager made referral to [facility] for home health Services. Patient will have 24 hour supervision via daughters. Patient's daughter to provide transportation home. No further needs identified by Case Manager.    1015 07-19-19 Referral sent to [facility] for Palliative Services- Patient will receive a phone call from [facility].   Expected Discharge Plan: [place] Barriers to Discharge: No Barriers Identified  Expected Discharge Plan and Services Expected Discharge Plan: [place] In-house Referral: NA Discharge Planning Services: CM Consult Post Acute Care Choice: [place] arrangements for the past 2 months: Single Family Home                 DME Arranged: Oxygen, Nebulizer machine(Patient has RW, 3n1 and WC in the home.) DME Agency: [employer] Date DME Agency Contacted: 07/19/19 Time DME Agency Contacted: [phone] Representative spoke with at DME Agency: [place]: PT [place]: [place] Date [place]: 07/18/19 Time HH Agency Contacted: 0822(weekend Case Manager) Representative spoke with at [place]: unsure   Social Determinants of Health ([place]) Interventions     Readmission Risk Interventions Readmission Risk Prevention Plan 03/16/2019  Transportation Screening Complete  PCP or Specialist Appt within 5-7 Days Complete  Home Care Screening Complete  Medication Review (RN CM) Complete  Some recent data might be hidden

## 2019-07-19 NOTE — Progress Notes (Addendum)
Progress Note  Patient Name: [name] Date of Encounter: 07/19/2019  Primary Cardiologist: Sanda Klein, MD   Subjective   Pt resting in bed, anxious to go home  Inpatient Medications    Scheduled Meds: . amiodarone  100 mg Oral Daily  . apixaban  2.5 mg Oral BID  . benzonatate  100 mg Oral TID  . Chlorhexidine Gluconate Cloth  6 each Topical Daily  . feeding supplement (ENSURE ENLIVE)  237 mL Oral BID BM  . guaiFENesin  1,200 mg Oral BID  . ipratropium-albuterol  3 mL Inhalation TID  . loratadine  5 mg Oral Daily  . metoprolol succinate  25 mg Oral Daily  . multivitamin with minerals  1 tablet Oral Daily  . polyethylene glycol  17 g Oral Daily  . predniSONE  10 mg Oral Q breakfast  . senna-docusate  2 tablet Oral BID  . sertraline  25 mg Oral Daily  . sodium chloride flush  3 mL Intravenous Q12H  . sodium chloride HYPERTONIC  4 mL Nebulization TID   Continuous Infusions: . sodium chloride     PRN Meds: sodium chloride, acetaminophen **OR** acetaminophen, levalbuterol, ondansetron (ZOFRAN) IV, sodium chloride flush, traMADol   Vital Signs    Vitals:   07/19/19 0646 07/19/19 0648 07/19/19 0800 07/19/19 0909  BP: (!) 154/68   122/60  Pulse: 71   76  Resp: 20     Temp: 98.1 F (36.7 C)     TempSrc: Oral     SpO2: 93%  96%   Weight:  43.1 kg    Height:       No intake or output data in the 24 hours ending 07/19/19 1000 Last 3 Weights 07/19/2019 07/18/2019 07/17/2019  Weight (lbs) 95 lb 0.3 oz 98 lb 1.7 oz 96 lb 11.2 oz  Weight (kg) 43.1 kg 44.5 kg 43.863 kg      Telemetry    Sinus rhythm in the 60s - Personally Reviewed  ECG    No new tracings - Personally Reviewed  Physical Exam   GEN: No acute distress.   Neck: No JVD Cardiac: RRR, no murmurs, rubs, or gallops.  Respiratory:wheezing throughout, on o2 GI: Soft, nontender, non-distended  MS: No edema; No deformity. Neuro:  Nonfocal  Psych: Normal affect   Labs    High Sensitivity  Troponin:   Recent Labs  Lab 06/23/19 1928 06/23/19 2137 07/14/19 2206 07/15/19 0106  TROPONINIHS 834* 2,935* 42* 231*      Chemistry Recent Labs  Lab 07/14/19 2206 07/16/19 0446 07/17/19 0619 07/18/19 0707 07/19/19 0851  NA 125*   < > 128* 128* 127*  K 2.7*   < > 4.1 3.5 3.4*  CL 85*   < > 92* 92* 87*  CO2 26   < > 28 28 28   GLUCOSE 183*   < > 123* 96 174*  BUN 14   < > 16 20 23   CREATININE 0.60   < > 0.76 0.70 0.75  CALCIUM 8.0*   < > 8.4* 8.5* 8.4*  PROT 6.7  --  6.7  --   --   ALBUMIN 2.4*  --  2.3*  --   --   AST 34  --  37  --   --   ALT 31  --  28  --   --   ALKPHOS 126  --  118  --   --   BILITOT 0.8  --  0.9  --   --  GFRNONAA >60   < > >60 >60 >60  GFRAA >60   < > >60 >60 >60  ANIONGAP 14   < > 8 8 12    < > = values in this interval not displayed.     Hematology Recent Labs  Lab 07/14/19 2206 07/16/19 0446 07/17/19 0619  WBC 9.3 7.7 7.4  RBC 3.83* 3.87 3.92  HGB 12.6 12.7 12.7  HCT 37.1 37.4 37.9  MCV 96.9 96.6 96.7  MCH 32.9 32.8 32.4  MCHC 34.0 34.0 33.5  RDW 16.3* 16.8* 16.6*  PLT 336 335 380    BNP Recent Labs  Lab 07/14/19 2206  BNP 452.2*     DDimer No results for input(s): DDIMER in the last 168 hours.   Radiology    No results found.  Cardiac Studies    ECHO: LIMITED 02/12/20201 1. No definity used Endocardial border definition poor but EF appears  significantly improved since echo done 06/24/19 . Left ventricular ejection  fraction, by estimation, is 55 to 60%. The left ventricle has normal  function. The left ventricle has no  regional wall motion abnormalities.  2. Right ventricular systolic function is normal. The right ventricular  size is normal. There is normal pulmonary artery systolic pressure.  3. The mitral valve is normal in structure and function. Trivial mitral  valve regurgitation. No evidence of mitral stenosis.  4. The aortic valve is tricuspid. Aortic valve regurgitation is not  visualized. Mild  to moderate aortic valve sclerosis/calcification is  present, without any evidence of aortic stenosis.   ECHO: 06/24/2019 1. Left ventricular ejection fraction, by visual estimation, is 35%. The  left ventricle has moderately decreased function. There is no left  ventricular hypertrophy.  2. Left ventricular diastolic parameters are consistent with Grade I  diastolic dysfunction (impaired relaxation).  3. The left ventricle demonstrates regional wall motion abnormalities.  Unusual pattern of wall motion abnormalities. The basal to mid  inferoseptal, anteroseptal, inferior and inferolateral walls are akinetic.  Severe hypokinesis of the basal anterior and  basal anterolateral walls. Preservation of apical segments. Possible  "reverse Takotsubo" picture, but need to rule out coronary disease.  4. Global right ventricle has normal systolic function.The right  ventricular size is normal. No increase in right ventricular wall  thickness.  5. Left atrial size was normal.  6. Right atrial size was normal.  7. Mild mitral annular calcification.  8. The mitral valve is normal in structure. Mild mitral valve  regurgitation. No evidence of mitral stenosis.  9. The tricuspid valve is normal in structure. Tricuspid valve  regurgitation is trivial.  10. The aortic valve is tricuspid. Aortic valve regurgitation is not  visualized. Mild aortic valve sclerosis without stenosis.  11. TR signal is inadequate for assessing pulmonary artery systolic  pressure.  12. The inferior vena cava is normal in size with greater than 50%  respiratory variability, suggesting right atrial pressure of 3 mmHg.   CATH: 06/25/2019  Prox LAD to Mid LAD lesion is 20% stenosed.  2nd Diag lesion is 80% stenosed.  LV end diastolic pressure is normal.  1. 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 Normal LVEDP  Plan: diagonal disease does not explain her clinical picture which is more  c/w stress induced CM. Recommend medical management.   Patient Profile     86 y.o. female with a hx of COVID-19 infection (diagnosed on 06/03/2019), Stage 4 lung CA (s/p lobectomy, XRT and currently on immunotherapy), systolic heart failure (EF  35-40%), atrial flutter/fibrillation (CHA2DS2-VASc Score 3, on chronic anticoagulation with Eliquis), previous episode of ischemic right hand on 03/13/2019 (requiring embolectomy), migraine headaches and hyperlipidemia, who is being seen today for the evaluation of amio in setting of XRT pneumonitis, MAT.  Assessment & Plan    1. Paroxysmal atrial tachycardia with aberrancy 2. Hx of atrial flutter - continue amiodarone at 100 mg - Dr. Sallyanne Kuster agrees with this dose for now given her size - bradycardic at times, admitted with lightheadedness and dizziness - question continuing BB - continue eliquis for history of atrial flutter - no bleeding - This patients CHA2DS2-VASc Score and unadjusted Ischemic Stroke Rate (% per year) is equal to 11.2 % stroke rate/year from a score of 7 (2age, female, 2TE, CAD, CHF)   3. Chronic systolic heart failure 4. Hx of Takotsubo cardiomyopathy - echo this admission with improved EF to 55-60% - euvolemic today   5. Wheezing 6. Dyspnea - going home on O2 and nebulizer    [facility] will sign off.   Medication Recommendations:  As in [facility] Other recommendations (labs, testing, etc):   Follow up as an outpatient:  appt scheduled, pt family aware  For questions or updates, please contact [facility] Please consult [email or web address] for contact info under        Signed, Ledora Bottcher, PA  07/19/2019, 10:00 AM    I have examined the patient and reviewed assessment and plan and discussed with patient.  Agree with above as stated.  Heart rhythm stable on lower dose of amiodarone.  Eliquis for stroke prevention.  Larae Grooms

## 2019-07-19 NOTE — Progress Notes (Signed)
Physical Therapy Treatment Patient Details Name: [name] MRN: [id number] DOB: 1932/11/25 Today's Date: 07/19/2019    History of Present Illness 86yo female with recent hospitalization due to possible Takotsubo caridomyopathy with CHF/rapid A-fib and discharged home on room air. Now complaining of extreme SOB with ambulation, found to be in 70s on room air. PE negative. PMH A-fib, hx lung CA with radiation and chemo, embolectomy, cardaic cath    PT Comments    Pt progressing slowly w PT.  Required max cues for relaxation and pursed lip breathing w all activity.  Pt stable on 2 LPM O2 at rest but needing 6 LPM with activity to maintain sats 90%.  Ambulated 20' w RW and min guard no LOB or buckling. Daughter present and encouraging pt to participate and in breathing techniques.    Follow Up Recommendations  Home health PT;Supervision/Assistance - 24 hour(pt declined SNF)     Equipment Recommendations  3in1 (PT);Rolling walker with 5" wheels    Recommendations for Other Services       Precautions / Restrictions Precautions Precautions: Fall Precaution Comments: watch SPO2    Mobility  Bed Mobility Overal bed mobility: Modified Independent             General bed mobility comments: increased time, HOB elevated  Transfers Overall transfer level: Needs assistance Equipment used: Rolling walker (2 wheeled) Transfers: Sit to/from Stand Sit to Stand: Min guard         General transfer comment: performed x 2 w cues for hand placement  Ambulation/Gait Ambulation/Gait assistance: Min assist Gait Distance (Feet): 25 Feet Assistive device: Rolling walker (2 wheeled) Gait Pattern/deviations: Decreased stride length Gait velocity: reduced   General Gait Details: 5' then 20'; steady but fatigued easiy; no buckling; max cues for pursed lip breathing   Stairs             Wheelchair Mobility    Modified Rankin (Stroke Patients Only)       Balance Overall  balance assessment: Needs assistance Sitting-balance support: No upper extremity supported;Feet supported       Standing balance support: Bilateral upper extremity supported Standing balance-Leahy Scale: Fair                              Cognition Arousal/Alertness: Awake/alert Behavior During Therapy: WFL for tasks assessed/performed Overall Cognitive Status: Within Functional Limits for tasks assessed                                 General Comments: requured encouragment from PT and dtr to participate      Exercises      General Comments General comments (skin integrity, edema, etc.): Pt on 2LPM O2 atrival w sats 93%. Increased to 3 LPM for initial 5' ambulation sat90% but dropped to 86% during recovery.  4 mins to recover then ambulated 20' on 5 LPM - sats down to 88% and took 1:45 to recover on 6 LPM O2.  Gradually decreased back to 2 LPM w sats 93%.  Pt required max cues and demonstration of pursed lip breathing and relaxation during all mobility.  Tended to breath in mouth.  Dyspnea 3/4 w activity.      Pertinent Vitals/Pain Pain Assessment: No/denies pain    Home Living  Prior Function            PT Goals (current goals can now be found in the care plan section) Progress towards PT goals: Progressing toward goals    Frequency    Min 3X/week      PT Plan Current plan remains appropriate    Co-evaluation              AM-PAC PT "6 Clicks" Mobility   Outcome Measure  Help needed turning from your back to your side while in a flat bed without using bedrails?: None Help needed moving from lying on your back to sitting on the side of a flat bed without using bedrails?: None Help needed moving to and from a bed to a chair (including a wheelchair)?: A Little Help needed standing up from a chair using your arms (e.g., wheelchair or bedside chair)?: A Little Help needed to walk in hospital room?: A  Little Help needed climbing 3-5 steps with a railing? : A Lot 6 Click Score: 19    End of Session Equipment Utilized During Treatment: Oxygen Activity Tolerance: Patient limited by fatigue Patient left: in chair;with call bell/phone within reach;with family/visitor present Nurse Communication: Mobility status PT Visit Diagnosis: Unsteadiness on feet (R26.81);History of falling (Z91.81);Muscle weakness (generalized) (M62.81);Hemiplegia and hemiparesis;Difficulty in walking, not elsewhere classified (R26.2)     Time: 8288-3374 PT Time Calculation (min) (ACUTE ONLY): 23 min  Charges:  $Gait Training: 8-22 mins $Therapeutic Activity: 8-22 mins                     Maggie Font, PT Acute Rehab Services Pager [phone] [facility] [phone]    Karlton Lemon 07/19/2019, 11:30 AM

## 2019-07-19 NOTE — Telephone Encounter (Signed)
Phone call placed to patient's daughter-Michelle Laurann [place] to offer to schedule a visit with [facility]. Phone rang, with no answer I left a voicemail for call back.

## 2019-07-19 NOTE — Telephone Encounter (Signed)
[facility] visit scheduled for 07-22-19 at 3:00.

## 2019-07-19 NOTE — Progress Notes (Signed)
[facility]  Received request from [facility] for patient/family interest in [place] at home after discharge. Spoke with daughter by phone to confirm interest, explain services and identify PCP. Made PCP office aware of [facility] involvement. Daughter requesting follow up call once home to arrange first visit in the home.   Thank you,  Erling Conte, LCSW [phone] Hilma Favors are listed daily on AMION under Hosp[facility] and [place]

## 2019-07-19 NOTE — Discharge Summary (Signed)
Physician Discharge Summary  PANTERA WINTERROWD XFG:[id number] DOB: Oct 19, 1932 DOA: 07/14/2019  PCP: [name], MD  Admit date: 07/14/2019 Discharge date: 07/19/2019  Admitted From: Home Disposition: Home  Recommendations for Outpatient Follow-up:  1. Follow up with PCP in 1 week with repeat CBC/BMP 2. Outpatient follow-up with cardiology/pulmonary 3. Recommend outpatient follow-up with palliative care 4. Follow up in ED if symptoms worsen or new appear   Home Health: No Equipment/Devices: None  Discharge Condition: Guarded to poor CODE STATUS: DNR  diet recommendation: Heart healthy/fluid restriction of up to 1500 cc a day  Brief/Interim Summary: 86 year old female with history of recurrent non-small cell lung cancer currently on nivolumab immunotherapy, previous history of radiation, radiation pneumonitis, recent history of stress cardiomyopathy, paroxysmal A. fib, multifocal atrial tachycardia, brachial artery embolus, recurrent hospitalization, recent COVID-19 infection presented with worsening shortness of breath and wheezing.  Patient recently had a cardiac cath, no major coronary artery disease, EF of 35% and was discharged home on Lasix.  On presentation this time in the ED, chest x-ray showed bilateral perihilar prominence, no pleural effusion.  She was admitted and started on IV Lasix.  Repeated 2D echo showed EF of 55 to 60%.  Cardiology was consulted.  Subsequently Lasix was discontinued.  CODE STATUS has been changed to DNR after palliative care discussion.  She will need outpatient palliative care evaluation and follow-up and if condition does not improve, recommend comfort measures/hospice.  Cardiology decreased dose of amiodarone 200 mg daily.  Cardiology has cleared the patient for discharge.  She is very deconditioned and will need home health and supplemental oxygen on discharge.  She will be discharged home today.  Refused SNF placement.  Discharge Diagnoses:   Acute  hypoxic respiratory failure Probable COPD exacerbation -In the setting of bilateral recurrent lung cancer status post radiation/lobectomy x2 along with possibility of COPD component -Still requiring supplemental oxygen and saturations dropping to the 80s on room air.  She will be discharged on supplemental oxygen via nasal cannula 2 to 3 L/min -Continue low-dose prednisone for few more days on discharge.  Will add Dulera on discharge.  Patient will need nebulizer on discharge.  Outpatient follow-up with pulmonary  Chronic combined systolic and diastolic heart failure -2D echo on 07/16/2019 showed improved LVEF of 55 to 60% from 35% along with grade 1 diastolic dysfunction -Initially treated with IV Lasix which was subsequently discontinued by cardiology.  Negative balance of 1683.2 cc since admission -Continue dietary compliance and fluid restriction on discharge.  Outpatient follow-up with cardiology.  Cardiology has cleared the patient for discharge.  Paroxysmal A. fib -Currently rate controlled.  Dose of amiodarone has been decreased to 100 mg daily by cardiology.  Cardiology has cleared the patient for discharge.  Outpatient follow-up with cardiology.  Continue Eliquis.  Euvolemic hyponatremia, suspect SIADH with underlying lung cancer -Sodium 127 today.  Asymptomatic.  Outpatient follow-up with PCP.  Monitor sodium as an outpatient  Bilateral lung cancer with recurrence -Patient has had multiple surgeries, chemotherapy, radiation and currently on chemotherapy.  Followed by Dr. Julien Nordmann.  Outpatient follow-up with Dr. Julien Nordmann to continue goals of care discussion.  Resolved delirium -Continue with Zoloft at night.  Outpatient follow-up  Severe protein calorie malnutrition -Follow nutrition recommendations  Generalized deconditioning -Overall prognosis guarded to poor.  Palliative care eval has evaluated the patient and patient's CODE STATUS has been changed to DNR.  Recommend outpatient  palliative care evaluation and follow-up.  If condition does not improve, consider hospice/comfort measures  History of COVID-19 infection -Positive Covid on 06/02/2019.  Outside of quarantine window.  Ambulatory dysfunction -We will need home health PT/OT.  Patient refused SNF placement.   Discharge Instructions  Discharge Instructions    Ambulatory referral to Cardiology   Complete by: As directed    Ambulatory referral to Pulmonology   Complete by: As directed    Hypoxia   Diet - low sodium heart healthy   Complete by: As directed    For home use only DME Nebulizer machine   Complete by: As directed    Patient needs a nebulizer to treat with the following condition: COPD exacerbation ([place])   Length of Need: 6 Months   Increase activity slowly   Complete by: As directed      Allergies as of 07/19/2019      Reactions   Simvastatin Other (See Comments)   Cluster migraines      Medication List    STOP taking these medications   furosemide 20 MG tablet Commonly known as: LASIX     TAKE these medications   albuterol 108 (90 Base) MCG/ACT inhaler Commonly known as: VENTOLIN HFA Inhale 2 puffs into the lungs every 6 (six) hours as needed for wheezing or shortness of breath. What changed: Another medication with the same name was added. Make sure you understand how and when to take each.   albuterol (2.5 MG/3ML) 0.083% nebulizer solution Commonly known as: PROVENTIL Take 3 mLs (2.5 mg total) by nebulization every 6 (six) hours as needed for wheezing or shortness of breath. What changed: You were already taking a medication with the same name, and this prescription was added. Make sure you understand how and when to take each.   amiodarone 100 MG tablet Commonly known as: PACERONE Take 1 tablet (100 mg total) by mouth daily. What changed:   medication strength  how much to take   apixaban 2.5 MG Tabs tablet Commonly known as: ELIQUIS Take 1 tablet (2.5 mg total)  by mouth 2 (two) times daily.   benzonatate 100 MG capsule Commonly known as: TESSALON Take 1 capsule (100 mg total) by mouth 3 (three) times daily as needed for up to 4 days for cough.   Combivent Respimat 20-100 MCG/ACT Aers respimat Generic drug: Ipratropium-Albuterol Inhale 1 puff into the lungs every 6 (six) hours.   Dulera 100-5 MCG/ACT Aero Generic drug: mometasone-formoterol Inhale 2 puffs into the lungs in the morning and at bedtime.   feeding supplement (ENSURE ENLIVE) Liqd Take 237 mLs by mouth 2 (two) times daily between meals for 7 days.   metoprolol succinate 25 MG 24 hr tablet Commonly known as: TOPROL-XL Take 1 tablet (25 mg total) by mouth daily. What changed: additional instructions   multivitamin with minerals Tabs tablet Take 1 tablet by mouth daily.   predniSONE 10 MG tablet Commonly known as: DELTASONE Take 1 tablet (10 mg total) by mouth daily with breakfast for 3 days.   sertraline 25 MG tablet Commonly known as: ZOLOFT Take 25 mg by mouth daily.   vitamin C 500 MG tablet Commonly known as: ASCORBIC ACID Take 500 mg by mouth daily.            Durable Medical Equipment  (From admission, onward)         Start     Ordered   07/19/19 0519  For home use only DME oxygen  Once    Question Answer Comment  Length of Need 6 Months   Mode or (  Route) Nasal cannula   Liters per Minute 3   Frequency Continuous (stationary and portable oxygen unit needed)   Oxygen conserving device Yes   Oxygen delivery system Gas      07/19/19 0519   07/19/19 0000  For home use only DME Nebulizer machine    Question Answer Comment  Patient needs a nebulizer to treat with the following condition COPD exacerbation ([place])   Length of Need 6 Months      07/19/19 1037   07/18/19 1657  For home use only DME Nebulizer machine  Once    Question Answer Comment  Patient needs a nebulizer to treat with the following condition COPD (chronic obstructive pulmonary disease)  ([place])   Length of Need 6 Months      07/18/19 1656   07/18/19 0600  For home use only DME 3 n 1  Once     07/18/19 0559   07/18/19 0600  For home use only DME Walker rolling  Once    Question Answer Comment  Walker: With [place] Wheels   Patient needs a walker to treat with the following condition Ambulatory dysfunction      07/18/19 0559         Follow-up Information    [facility] Follow up.   Specialty: [place] Why: They will continue to do your home health care at your home Contact information: [place] [address] [phone]        [name], MD. Call in 1 day(s).   Specialty: Family Medicine Why: Please call for a post hospital follow up appointment. Contact information: [place] [address] [phone]        Sanda Klein, MD .   Specialty: Cardiology Contact information: [place] [address] [phone]        Almyra Deforest, [place] Follow up on 07/28/2019.   Specialties: Cardiology, Radiology Why: 2:45 pm for hospital follow up Contact information: [address] [place] [address] [place], [place] [facility] Follow up.   Why: Oxygen and Nebulizer Machine to be delivered to bedside prior to transition home.  Contact information: [address] [place] [address] [phone]          Allergies  Allergen Reactions  . Simvastatin Other (See Comments)    Cluster migraines    Consultations: Cardiology/palliative care  Procedures/Studies: CT Angio Chest PE W and/or Wo Contrast  Result Date: 06/23/2019 CLINICAL DATA:  Respiratory distress. EXAM: CT ANGIOGRAPHY CHEST WITH CONTRAST TECHNIQUE: Multidetector CT imaging of the chest was performed using the standard protocol during bolus administration of intravenous contrast. Multiplanar CT image reconstructions and MIPs were obtained to  evaluate the vascular anatomy. CONTRAST:  162mL OMNIPAQUE IOHEXOL 350 MG/ML SOLN COMPARISON:  May 24, 2019 FINDINGS: Cardiovascular: A right-sided venous Port-A-Cath is in place. There is moderate severity calcification of the aortic arch. Satisfactory opacification of the pulmonary arteries to the segmental level. While no intraluminal filling defects are identified there is suspected occlusion of upper lobe branches of the right pulmonary artery as a result of a large paramediastinal mass (see below). Normal heart size. No pericardial effusion. Marked severity coronary artery calcification is seen. Mediastinum/Nodes: A stable approximately 10.6 cm x 4.2 cm right-sided paramediastinal soft tissue mass is seen. This contains small areas of calcification and is unchanged in size and appearance when compared to the  prior study. Lungs/Pleura: Mild stable areas of patchy scarring and/or atelectasis are seen within the left upper lobe and right lower lobe. Additional new mild areas of atelectasis and/or infiltrate are seen within the left upper lobe and right lower lobe. A 2.7 cm x 1.7 cm area of patchy consolidation is seen along the posteromedial aspect of the right lower lobe (axial CT image 79, CT series number 11). This is increased in size when compared to the prior study. There is a small right pleural effusion. Upper Abdomen: No acute abnormality. Musculoskeletal: Multilevel degenerative changes seen throughout the thoracic spine. Review of the MIP images confirms the above findings. IMPRESSION: 1. 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 2. Mild, stable areas of patchy left upper lobe and left lower lobe scarring and/or atelectasis with new areas of left upper lobe and right lower lobe atelectasis and/or infiltrate noted. 3. Patchy area of right lower lobe consolidation which is  increased in size when compared to the prior study. While this may represent worsening infiltrate, sequelae associated with an underlying neoplastic process cannot be excluded. 4. No visible areas of pulmonary emboli. 5. Small right pleural effusion. Electronically Signed   By: Virgina Norfolk M.D.   On: 06/23/2019 22:29   CARDIAC CATHETERIZATION  Result Date: 06/25/2019  Prox LAD to Mid LAD lesion is 20% stenosed.  2nd Diag lesion is 80% stenosed.  LV end diastolic pressure is normal.  1. 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 Normal LVEDP Plan: diagonal disease does not explain her clinical picture which is more c/w stress induced CM. Recommend medical management.   DG CHEST PORT 1 VIEW  Result Date: 07/16/2019 CLINICAL DATA:  Shortness of breath EXAM: PORTABLE CHEST 1 VIEW COMPARISON:  07/13/2010 FINDINGS: Cardiac shadow is stable. Persistent paramediastinal/paraspinal mass lesion the right is noted. Right chest wall port is noted at the cavoatrial junction. Aortic calcifications are again seen and stable. Postsurgical changes in the left upper lobe are noted. Patchy airspace opacities are again identified throughout the left lung. No sizable effusion is noted. IMPRESSION: Stable right-sided mass consistent with the given clinical history of lung carcinoma. Persistent left lung opacities. Electronically Signed   By: Inez Catalina M.D.   On: 07/16/2019 08:44   DG Chest Port 1 View  Result Date: 07/14/2019 CLINICAL DATA:  Short of breath, nausea and vomiting, lung cancer EXAM: PORTABLE CHEST 1 VIEW COMPARISON:  06/23/2019, 06/25/2019 FINDINGS: Single frontal view of the chest demonstrates masslike consolidation right upper lobe consistent with known lung cancer. Cardiac silhouette is stable. Right chest wall port unchanged. Since the prior exam, there is progression of the left-sided ground-glass airspace disease. No evidence pneumothorax. No acute bony abnormality. IMPRESSION:  1. Developing left-sided airspace disease consistent with asymmetric edema or pneumonia. 2. Right upper lobe mass consistent with known lung cancer. Electronically Signed   By: Randa Ngo M.D.   On: 07/14/2019 22:30   DG CHEST PORT 1 VIEW  Result Date: 06/25/2019 CLINICAL DATA:  Shortness of breath. EXAM: PORTABLE CHEST 1 VIEW COMPARISON:  CT 06/23/2019.  Chest x-ray 06/23/2019. FINDINGS: PowerPort catheter in stable position. Surgical sutures left chest. Heart size stable. Stable appearance of right paramediastinal mass. Patchy bilateral pulmonary infiltrates again noted, these are best identified by prior CT of 06/23/2019. Atelectatic changes left lung base. Stable right base pleural thickening. No pneumothorax. IMPRESSION: 1.  PowerPort catheter in stable  position. 2.  Stable appearance of known right paramediastinal mass. 3. Patchy bilateral pulmonary infiltrates again noted, these are best identified by prior CT of 06/23/2019. Atelectatic changes left base. Stable right pleural thickening. Electronically Signed   By: Marcello Moores  Register   On: 06/25/2019 07:13   DG Chest Portable 1 View  Result Date: 06/23/2019 CLINICAL DATA:  Shortness of breath. EXAM: PORTABLE CHEST 1 VIEW COMPARISON:  April 14, 2019 FINDINGS: There is stable right-sided venous Port-A-Cath positioning. The lungs are hyperinflated. A stable paramediastinal opacity is again seen extending from the suprahilar region on the right. Very mild atelectasis and/or early infiltrate is seen within the left lung base. The heart size and mediastinal contours are within normal limits. Degenerative changes seen throughout the thoracic spine. IMPRESSION: 1. Very mild left basilar atelectasis and/or early infiltrate. 2. Stable findings consistent with the patient's known right-sided paramediastinal mass. Electronically Signed   By: Virgina Norfolk M.D.   On: 06/23/2019 20:27   ECHOCARDIOGRAM COMPLETE  Result Date: 06/24/2019   ECHOCARDIOGRAM  REPORT   Patient Name:   [name] Date of Exam: 06/24/2019 Medical Rec #:  [id number]        Height:       62.0 in Accession #:    [id number]       Weight:       101.9 lb Date of Birth:  10-Jun-1932         BSA:          1.44 m Patient Age:    86 years         BP:           110/68 mmHg Patient Gender: F                HR:           57 bpm. Exam Location:  Inpatient Procedure: 2D Echo Indications:    Elevated Troponin  History:        Patient has prior history of Echocardiogram examinations, most                 recent 03/13/2019. History of covid 19.  Sonographer:    Vikki Ports Turrentine Referring Phys: [id number] JARED M GARDNER  Sonographer Comments: Technically difficult due to extremely small rib spacing. IMPRESSIONS  1. Left ventricular ejection fraction, by visual estimation, is 35%. The left ventricle has moderately decreased function. There is no left ventricular hypertrophy.  2. Left ventricular diastolic parameters are consistent with Grade I diastolic dysfunction (impaired relaxation).  3. The left ventricle demonstrates regional wall motion abnormalities. Unusual pattern of wall motion abnormalities. The basal to mid inferoseptal, anteroseptal, inferior and inferolateral walls are akinetic. Severe hypokinesis of the basal anterior and  basal anterolateral walls. Preservation of apical segments. Possible "reverse Takotsubo" picture, but need to rule out coronary disease.  4. Global right ventricle has normal systolic function.The right ventricular size is normal. No increase in right ventricular wall thickness.  5. Left atrial size was normal.  6. Right atrial size was normal.  7. Mild mitral annular calcification.  8. The mitral valve is normal in structure. Mild mitral valve regurgitation. No evidence of mitral stenosis.  9. The tricuspid valve is normal in structure. Tricuspid valve regurgitation is trivial. 10. The aortic valve is tricuspid. Aortic valve regurgitation is not visualized. Mild aortic valve  sclerosis without stenosis. 11. TR signal is inadequate for assessing pulmonary artery systolic pressure. 12. The inferior vena cava is normal in size  with greater than 50% respiratory variability, suggesting right atrial pressure of 3 mmHg. FINDINGS  Left Ventricle: Left ventricular ejection fraction, by visual estimation, is 35%. The left ventricle has moderately decreased function. The left ventricle demonstrates regional wall motion abnormalities. The left ventricular internal cavity size was the  left ventricle is normal in size. There is no left ventricular hypertrophy. Left ventricular diastolic parameters are consistent with Grade I diastolic dysfunction (impaired relaxation). Right Ventricle: The right ventricular size is normal. No increase in right ventricular wall thickness. Global RV systolic function is has normal systolic function. Left Atrium: Left atrial size was normal in size. Right Atrium: Right atrial size was normal in size Pericardium: There is no evidence of pericardial effusion. Mitral Valve: The mitral valve is normal in structure. Mild mitral annular calcification. Mild mitral valve regurgitation. No evidence of mitral valve stenosis by observation. Tricuspid Valve: The tricuspid valve is normal in structure. Tricuspid valve regurgitation is trivial. Aortic Valve: The aortic valve is tricuspid. Aortic valve regurgitation is not visualized. Mild aortic valve sclerosis is present, with no evidence of aortic valve stenosis. Pulmonic Valve: The pulmonic valve was normal in structure. Pulmonic valve regurgitation is not visualized. Pulmonic regurgitation is not visualized. Aorta: The aortic root is normal in size and structure. Venous: The inferior vena cava is normal in size with greater than 50% respiratory variability, suggesting right atrial pressure of 3 mmHg. IAS/Shunts: No atrial level shunt detected by color flow Doppler.  LEFT VENTRICLE PLAX 2D LVIDd:         4.20 cm  Diastology LVIDs:          3.40 cm  LV e' lateral:   3.70 cm/s LV PW:         0.70 cm  LV E/e' lateral: 13.3 LV IVS:        0.70 cm  LV e' medial:    3.48 cm/s LVOT diam:     1.60 cm  LV E/e' medial:  14.2 LV SV:         31 ml LV SV Index:   21.98 LVOT Area:     2.01 cm  RIGHT VENTRICLE TAPSE (M-mode): 2.1 cm LEFT ATRIUM             Index       RIGHT ATRIUM           Index LA diam:        4.10 cm 2.86 cm/m  RA Area:     11.20 cm LA Vol (A2C):   35.7 ml 24.88 ml/m RA Volume:   25.80 ml  17.98 ml/m LA Vol (A4C):   32.8 ml 22.86 ml/m LA Biplane Vol: 35.1 ml 24.46 ml/m  AORTIC VALVE LVOT Vmax:   81.60 cm/s LVOT Vmean:  54.200 cm/s LVOT VTI:    0.171 m  AORTA Ao Root diam: 2.50 cm MITRAL VALVE MV Area (PHT): 3.37 cm              SHUNTS MV PHT:        65.25 msec            Systemic VTI:  0.17 m MV Decel Time: 225 msec              Systemic Diam: 1.60 cm MV E velocity: 49.30 cm/s  103 cm/s MV A velocity: 110.00 cm/s 70.3 cm/s MV E/A ratio:  0.45        1.5  Loralie Champagne MD Electronically signed by Loralie Champagne MD  Signature Date/Time: 06/24/2019/6:38:18 PM    Final    XR HIP UNILAT W OR W/O PELVIS 2-3 VIEWS RIGHT  Result Date: 07/06/2019 Healed acetabular and pubic rami fractures.  There is abundant callus formation.  ECHOCARDIOGRAM LIMITED  Result Date: 07/16/2019    ECHOCARDIOGRAM LIMITED REPORT   Patient Name:   [name] Date of Exam: 07/16/2019 Medical Rec #:  [id number]        Height:       62.0 in Accession #:    [id number]       Weight:       99.1 lb Date of Birth:  1933/02/19         BSA:          1.42 m Patient Age:    38 years         BP:           145/64 mmHg Patient Gender: F                HR:           71 bpm. Exam Location:  Inpatient Procedure: Limited Echo, Color Doppler and Cardiac Doppler Indications:    S49.67 Acute systolic (congestive) heart failure  History:        Patient has prior history of Echocardiogram examinations, most                 recent 06/24/2019. Takotsubo Cardiomyopathy, Bilateral  Lung                 Cancer, COVID+ 06/02/19.  Sonographer:    Raquel Sarna Senior RDCS Referring Phys: [id number] [facility]  Sonographer Comments: Technically difficult due to small rib spaces. IMPRESSIONS  1. No definity used Endocardial border definition poor but EF appears significantly improved since echo done 06/24/19 . Left ventricular ejection fraction, by estimation, is 55 to 60%. The left ventricle has normal function. The left ventricle has no regional wall motion abnormalities.  2. Right ventricular systolic function is normal. The right ventricular size is normal. There is normal pulmonary artery systolic pressure.  3. The mitral valve is normal in structure and function. Trivial mitral valve regurgitation. No evidence of mitral stenosis.  4. The aortic valve is tricuspid. Aortic valve regurgitation is not visualized. Mild to moderate aortic valve sclerosis/calcification is present, without any evidence of aortic stenosis. FINDINGS  Left Ventricle: No definity used Endocardial border definition poor but EF appears significantly improved since echo done 06/24/19. Left ventricular ejection fraction, by estimation, is 55 to 60%. The left ventricle has normal function. The left ventricle has no regional wall motion abnormalities. The left ventricular internal cavity size was normal in size. There is no left ventricular hypertrophy. Right Ventricle: The right ventricular size is normal. No increase in right ventricular wall thickness. Right ventricular systolic function is normal. There is normal pulmonary artery systolic pressure. The tricuspid regurgitant velocity is 2.02 m/s, and  with an assumed right atrial pressure of 8 mmHg, the estimated right ventricular systolic pressure is 66.5 mmHg. Left Atrium: Left atrial size was normal in size. Right Atrium: Right atrial size was normal in size. Pericardium: There is no evidence of pericardial effusion. Mitral Valve: The mitral valve is normal in structure and  function. There is mild thickening of the mitral valve leaflet(s). Normal mobility of the mitral valve leaflets. Trivial mitral valve regurgitation. No evidence of mitral valve stenosis. Tricuspid Valve: The tricuspid valve is normal in structure. Tricuspid valve regurgitation is not  demonstrated. No evidence of tricuspid stenosis. Aortic Valve: The aortic valve is tricuspid. Aortic valve regurgitation is not visualized. Mild to moderate aortic valve sclerosis/calcification is present, without any evidence of aortic stenosis. Pulmonic Valve: The pulmonic valve was normal in structure. Pulmonic valve regurgitation is not visualized. No evidence of pulmonic stenosis. Aorta: The aortic root is normal in size and structure. IAS/Shunts: No atrial level shunt detected by color flow Doppler.  LEFT VENTRICLE PLAX 2D LVIDd:         2.90 cm LVIDs:         1.50 cm LV PW:         1.10 cm LV IVS:        1.00 cm LVOT diam:     1.60 cm LV SV Index:   18.74 LVOT Area:     2.01 cm  RIGHT VENTRICLE RV S prime:     14.10 cm/s TAPSE (M-mode): 2.0 cm LEFT ATRIUM         Index LA diam:    3.40 cm 2.40 cm/m   AORTA Ao Root diam: 2.30 cm TRICUSPID VALVE TR Peak grad:   16.3 mmHg TR Vmax:        202.00 cm/s  SHUNTS Systemic Diam: 1.60 cm Jenkins Rouge MD Electronically signed by Jenkins Rouge MD Signature Date/Time: 07/16/2019/11:57:11 AM    Final        Subjective: Patient seen and examined at bedside.  She is a poor historian.  Awake, does not feel that well but wants to go home today.  Daughter present at bedside.  No overnight fever, nausea or vomiting reported.  Discharge Exam: Vitals:   07/19/19 0800 07/19/19 0909  BP:  122/60  Pulse:  76  Resp:    Temp:    SpO2: 96%     General: Pt is alert, awake, not in acute distress.  Chronically ill looking, extremity thinly built female lying in bed. Cardiovascular: rate controlled, S1/S2 + Respiratory: bilateral decreased breath sounds at bases with some scattered crackles  and wheezing Abdominal: Soft, NT, ND, bowel sounds + Extremities: no edema, no cyanosis    The results of significant diagnostics from this hospitalization (including imaging, microbiology, ancillary and laboratory) are listed below for reference.     Microbiology: Recent Results (from the past 240 hour(s))  SARS CORONAVIRUS 2 (TAT 6-24 HRS) Nasopharyngeal Nasopharyngeal Swab     Status: None   Collection Time: 07/15/19  2:22 AM   Specimen: Nasopharyngeal Swab  Result Value Ref Range Status   SARS Coronavirus 2 NEGATIVE NEGATIVE Final    Comment: (NOTE) SARS-CoV-2 target nucleic acids are NOT DETECTED. The SARS-CoV-2 RNA is generally detectable in upper and lower respiratory specimens during the acute phase of infection. Negative results do not preclude SARS-CoV-2 infection, do not rule out co-infections with other pathogens, and should not be used as the sole basis for treatment or other patient management decisions. Negative results must be combined with clinical observations, patient history, and epidemiological information. The expected result is Negative. Fact Sheet for Patients: SugarRoll.be Fact Sheet for Healthcare Providers: https://www.woods-mathews.com/ This test is not yet approved or cleared by the [place] FDA and  has been authorized for detection and/or diagnosis of SARS-CoV-2 by FDA under an Emergency Use Authorization (EUA). This EUA will remain  in effect (meaning this test can be used) for the duration of the COVID-19 declaration under Section 56 4(b)(1) of the Act, 21 U.S.C. section 360bbb-3(b)(1), unless the authorization is terminated or revoked sooner.  Performed at [facility], [place] [address] [place], [place] [address]      Labs: BNP (last 3 results) Recent Labs    07/01/19 1635 07/14/19 2206  BNP 147.1* 009.3*   Basic Metabolic Panel: Recent Labs  Lab 07/16/19 0446 07/16/19 1104  07/17/19 0619 07/18/19 0707 07/19/19 0851  NA 128* 128* 128* 128* 127*  K 6.0* 4.1 4.1 3.5 3.4*  CL 95* 91* 92* 92* 87*  CO2 25 26 28 28 28   GLUCOSE 148* 138* 123* 96 174*  BUN 14 13 16 20 23   CREATININE 0.78 0.72 0.76 0.70 0.75  CALCIUM 8.9 8.3* 8.4* 8.5* 8.4*  MG 1.8  --  2.2 1.9 1.9  PHOS  --  3.5 4.1 2.9  --    Liver Function Tests: Recent Labs  Lab 07/14/19 2206 07/17/19 0619  AST 34 37  ALT 31 28  ALKPHOS 126 118  BILITOT 0.8 0.9  PROT 6.7 6.7  ALBUMIN 2.4* 2.3*   No results for input(s): LIPASE, AMYLASE in the last 168 hours. No results for input(s): AMMONIA in the last 168 hours. CBC: Recent Labs  Lab 07/14/19 2206 07/16/19 0446 07/17/19 0619  WBC 9.3 7.7 7.4  NEUTROABS 7.9*  --  5.8  HGB 12.6 12.7 12.7  HCT 37.1 37.4 37.9  MCV 96.9 96.6 96.7  PLT 336 335 380   Cardiac Enzymes: No results for input(s): CKTOTAL, CKMB, CKMBINDEX, TROPONINI in the last 168 hours. BNP: Invalid input(s): POCBNP CBG: No results for input(s): GLUCAP in the last 168 hours. D-Dimer No results for input(s): DDIMER in the last 72 hours. Hgb A1c No results for input(s): HGBA1C in the last 72 hours. Lipid Profile No results for input(s): CHOL, HDL, LDLCALC, TRIG, CHOLHDL, LDLDIRECT in the last 72 hours. Thyroid function studies No results for input(s): TSH, T4TOTAL, T3FREE, THYROIDAB in the last 72 hours.  Invalid input(s): FREET3 Anemia work up No results for input(s): VITAMINB12, FOLATE, FERRITIN, TIBC, IRON, RETICCTPCT in the last 72 hours. Urinalysis    Component Value Date/Time   COLORURINE YELLOW 07/14/2019 2340   APPEARANCEUR HAZY (A) 07/14/2019 2340   LABSPEC 1.012 07/14/2019 2340   LABSPEC 1.030 03/17/2012 1051   PHURINE 6.0 07/14/2019 2340   GLUCOSEU 50 (A) 07/14/2019 2340   HGBUR NEGATIVE 07/14/2019 2340   BILIRUBINUR NEGATIVE 07/14/2019 2340   BILIRUBINUR Negative 03/17/2012 1051   KETONESUR NEGATIVE 07/14/2019 2340   PROTEINUR 30 (A) 07/14/2019 2340    NITRITE NEGATIVE 07/14/2019 2340   LEUKOCYTESUR TRACE (A) 07/14/2019 2340   LEUKOCYTESUR Negative 03/17/2012 1051   Sepsis Labs Invalid input(s): PROCALCITONIN,  WBC,  LACTICIDVEN Microbiology Recent Results (from the past 240 hour(s))  SARS CORONAVIRUS 2 (TAT 6-24 HRS) Nasopharyngeal Nasopharyngeal Swab     Status: None   Collection Time: 07/15/19  2:22 AM   Specimen: Nasopharyngeal Swab  Result Value Ref Range Status   SARS Coronavirus 2 NEGATIVE NEGATIVE Final    Comment: (NOTE) SARS-CoV-2 target nucleic acids are NOT DETECTED. The SARS-CoV-2 RNA is generally detectable in upper and lower respiratory specimens during the acute phase of infection. Negative results do not preclude SARS-CoV-2 infection, do not rule out co-infections with other pathogens, and should not be used as the sole basis for treatment or other patient management decisions. Negative results must be combined with clinical observations, patient history, and epidemiological information. The expected result is Negative. Fact Sheet for Patients: SugarRoll.be Fact Sheet for Healthcare Providers: https://www.woods-mathews.com/ This test is not yet approved or  cleared by the [place] and  has been authorized for detection and/or diagnosis of SARS-CoV-2 by FDA under an Emergency Use Authorization (EUA). This EUA will remain  in effect (meaning this test can be used) for the duration of the COVID-19 declaration under Section 56 4(b)(1) of the Act, 21 U.S.C. section 360bbb-3(b)(1), unless the authorization is terminated or revoked sooner. Performed at [facility], [place] [address], [place], [place] [address]      Time coordinating discharge: 35 minutes  SIGNED:   Aline August, MD  [facility] 07/19/2019, 10:39 AM

## 2019-07-19 NOTE — Care Management Important Message (Signed)
Important Message  Patient Details  Name: [name] MRN: [id number] Date of Birth: [birth year]   Medicare Important Message Given:  Yes     Memory Argue 07/19/2019, 2:29 PM    PATIENT DISCHARGE BEFORE IM GIVEN. IM MAIL

## 2019-07-21 ENCOUNTER — Inpatient Hospital Stay (HOSPITAL_COMMUNITY)
Admission: EM | Admit: 2019-07-21 | Discharge: 2019-07-23 | DRG: 951 | Disposition: A | Discharge status: Hospice | Payer: Medicare PPO | Attending: Internal Medicine | Admitting: Internal Medicine

## 2019-07-21 ENCOUNTER — Emergency Department (HOSPITAL_COMMUNITY): Payer: Medicare PPO

## 2019-07-21 ENCOUNTER — Encounter (HOSPITAL_COMMUNITY): Payer: Self-pay | Admitting: Emergency Medicine

## 2019-07-21 ENCOUNTER — Other Ambulatory Visit: Payer: Self-pay

## 2019-07-21 DIAGNOSIS — J700 Acute pulmonary manifestations due to radiation: Secondary | ICD-10-CM | POA: Diagnosis present

## 2019-07-21 DIAGNOSIS — R0902 Hypoxemia: Secondary | ICD-10-CM

## 2019-07-21 DIAGNOSIS — I4891 Unspecified atrial fibrillation: Secondary | ICD-10-CM | POA: Diagnosis present

## 2019-07-21 DIAGNOSIS — E43 Unspecified severe protein-calorie malnutrition: Secondary | ICD-10-CM | POA: Diagnosis present

## 2019-07-21 DIAGNOSIS — Z515 Encounter for palliative care: Secondary | ICD-10-CM | POA: Diagnosis present

## 2019-07-21 DIAGNOSIS — Z7952 Long term (current) use of systemic steroids: Secondary | ICD-10-CM | POA: Diagnosis not present

## 2019-07-21 DIAGNOSIS — Z681 Body mass index (BMI) 19 or less, adult: Secondary | ICD-10-CM

## 2019-07-21 DIAGNOSIS — E785 Hyperlipidemia, unspecified: Secondary | ICD-10-CM | POA: Diagnosis present

## 2019-07-21 DIAGNOSIS — E7800 Pure hypercholesterolemia, unspecified: Secondary | ICD-10-CM | POA: Diagnosis present

## 2019-07-21 DIAGNOSIS — J9620 Acute and chronic respiratory failure, unspecified whether with hypoxia or hypercapnia: Secondary | ICD-10-CM | POA: Diagnosis present

## 2019-07-21 DIAGNOSIS — R64 Cachexia: Secondary | ICD-10-CM | POA: Diagnosis present

## 2019-07-21 DIAGNOSIS — Z7901 Long term (current) use of anticoagulants: Secondary | ICD-10-CM

## 2019-07-21 DIAGNOSIS — C3492 Malignant neoplasm of unspecified part of left bronchus or lung: Secondary | ICD-10-CM | POA: Diagnosis not present

## 2019-07-21 DIAGNOSIS — Z923 Personal history of irradiation: Secondary | ICD-10-CM

## 2019-07-21 DIAGNOSIS — Z79899 Other long term (current) drug therapy: Secondary | ICD-10-CM

## 2019-07-21 DIAGNOSIS — J1282 Pneumonia due to coronavirus disease 2019: Secondary | ICD-10-CM | POA: Diagnosis present

## 2019-07-21 DIAGNOSIS — J9621 Acute and chronic respiratory failure with hypoxia: Secondary | ICD-10-CM | POA: Diagnosis present

## 2019-07-21 DIAGNOSIS — Z8616 Personal history of COVID-19: Secondary | ICD-10-CM | POA: Diagnosis present

## 2019-07-21 DIAGNOSIS — Z9981 Dependence on supplemental oxygen: Secondary | ICD-10-CM | POA: Diagnosis not present

## 2019-07-21 DIAGNOSIS — Z66 Do not resuscitate: Secondary | ICD-10-CM | POA: Diagnosis present

## 2019-07-21 DIAGNOSIS — Z9221 Personal history of antineoplastic chemotherapy: Secondary | ICD-10-CM

## 2019-07-21 DIAGNOSIS — Z888 Allergy status to other drugs, medicaments and biological substances status: Secondary | ICD-10-CM | POA: Diagnosis not present

## 2019-07-21 DIAGNOSIS — I5022 Chronic systolic (congestive) heart failure: Secondary | ICD-10-CM | POA: Diagnosis present

## 2019-07-21 DIAGNOSIS — I5181 Takotsubo syndrome: Secondary | ICD-10-CM | POA: Diagnosis present

## 2019-07-21 DIAGNOSIS — Z8249 Family history of ischemic heart disease and other diseases of the circulatory system: Secondary | ICD-10-CM

## 2019-07-21 DIAGNOSIS — Z85118 Personal history of other malignant neoplasm of bronchus and lung: Secondary | ICD-10-CM | POA: Diagnosis not present

## 2019-07-21 DIAGNOSIS — C3491 Malignant neoplasm of unspecified part of right bronchus or lung: Secondary | ICD-10-CM | POA: Diagnosis present

## 2019-07-21 LAB — CBC WITH DIFFERENTIAL/PLATELET
Abs Immature Granulocytes: 0.06 10*3/uL (ref 0.00–0.07)
Basophils Absolute: 0 10*3/uL (ref 0.0–0.1)
Basophils Relative: 0 %
Eosinophils Absolute: 0 10*3/uL (ref 0.0–0.5)
Eosinophils Relative: 0 %
HCT: 39.2 % (ref 36.0–46.0)
Hemoglobin: 12.8 g/dL (ref 12.0–15.0)
Immature Granulocytes: 1 %
Lymphocytes Relative: 5 %
Lymphs Abs: 0.5 10*3/uL — ABNORMAL LOW (ref 0.7–4.0)
MCH: 32.4 pg (ref 26.0–34.0)
MCHC: 32.7 g/dL (ref 30.0–36.0)
MCV: 99.2 fL (ref 80.0–100.0)
Monocytes Absolute: 0.6 10*3/uL (ref 0.1–1.0)
Monocytes Relative: 6 %
Neutro Abs: 8.5 10*3/uL — ABNORMAL HIGH (ref 1.7–7.7)
Neutrophils Relative %: 88 %
Platelets: 398 10*3/uL (ref 150–400)
RBC: 3.95 MIL/uL (ref 3.87–5.11)
RDW: 16.2 % — ABNORMAL HIGH (ref 11.5–15.5)
WBC: 9.7 10*3/uL (ref 4.0–10.5)
nRBC: 0 % (ref 0.0–0.2)

## 2019-07-21 LAB — RESPIRATORY PANEL BY RT PCR (FLU A&B, COVID)
Influenza A by PCR: NEGATIVE
Influenza B by PCR: NEGATIVE
SARS Coronavirus 2 by RT PCR: NEGATIVE

## 2019-07-21 LAB — BASIC METABOLIC PANEL
Anion gap: 13 (ref 5–15)
BUN: 19 mg/dL (ref 8–23)
CO2: 29 mmol/L (ref 22–32)
Calcium: 8.3 mg/dL — ABNORMAL LOW (ref 8.9–10.3)
Chloride: 89 mmol/L — ABNORMAL LOW (ref 98–111)
Creatinine, Ser: 0.54 mg/dL (ref 0.44–1.00)
GFR calc Af Amer: >60 mL/min (ref >60)
GFR calc non Af Amer: >60 mL/min (ref >60)
Glucose, Bld: 144 mg/dL — ABNORMAL HIGH (ref 70–99)
Potassium: 4 mmol/L (ref 3.5–5.1)
Sodium: 131 mmol/L — ABNORMAL LOW (ref 135–145)

## 2019-07-21 LAB — TROPONIN I (HIGH SENSITIVITY)
Troponin I (High Sensitivity): 45 ng/L — ABNORMAL HIGH (ref <18)
Troponin I (High Sensitivity): 59 ng/L — ABNORMAL HIGH (ref <18)

## 2019-07-21 LAB — BRAIN NATRIURETIC PEPTIDE: B Natriuretic Peptide: 1284.7 pg/mL — ABNORMAL HIGH (ref 0.0–100.0)

## 2019-07-21 MED ORDER — LORAZEPAM 2 MG/ML PO CONC: 1.0000 mg | ORAL | Status: DC | PRN

## 2019-07-21 MED ORDER — GLYCOPYRROLATE 0.2 MG/ML IJ SOLN: 0.2000 mg | INTRAMUSCULAR | Status: DC | PRN

## 2019-07-21 MED ORDER — ONDANSETRON 4 MG PO TBDP: 4.0000 mg | ORAL_TABLET | Freq: Four times a day (QID) | ORAL | Status: DC | PRN

## 2019-07-21 MED ORDER — HALOPERIDOL LACTATE 2 MG/ML PO CONC
0.5000 mg | ORAL | Status: DC | PRN
  Filled 2019-07-21: qty 0.3

## 2019-07-21 MED ORDER — LORAZEPAM 2 MG/ML IJ SOLN
1.0000 mg | INTRAMUSCULAR | Status: DC | PRN
  Administered 2019-07-21 – 2019-07-23 (×4): 1 mg via INTRAVENOUS
  Filled 2019-07-21 (×4): qty 1

## 2019-07-21 MED ORDER — ENSURE ENLIVE PO LIQD
237.0000 mL | Freq: Two times a day (BID) | ORAL | Status: DC
  Filled 2019-07-21: qty 237

## 2019-07-21 MED ORDER — GLYCOPYRROLATE 1 MG PO TABS
1.0000 mg | ORAL_TABLET | ORAL | Status: DC | PRN
  Filled 2019-07-21: qty 1

## 2019-07-21 MED ORDER — ACETAMINOPHEN 650 MG RE SUPP: 650.0000 mg | Freq: Four times a day (QID) | RECTAL | Status: DC | PRN

## 2019-07-21 MED ORDER — ACETAMINOPHEN 325 MG PO TABS: 650.0000 mg | ORAL_TABLET | Freq: Four times a day (QID) | ORAL | Status: DC | PRN

## 2019-07-21 MED ORDER — ALBUTEROL SULFATE HFA 108 (90 BASE) MCG/ACT IN AERS
4.0000 | INHALATION_SPRAY | Freq: Once | RESPIRATORY_TRACT | Status: AC
  Administered 2019-07-21: 4 via RESPIRATORY_TRACT
  Filled 2019-07-21: qty 6.7

## 2019-07-21 MED ORDER — HALOPERIDOL 0.5 MG PO TABS
0.5000 mg | ORAL_TABLET | ORAL | Status: DC | PRN
  Filled 2019-07-21: qty 1

## 2019-07-21 MED ORDER — HALOPERIDOL LACTATE 5 MG/ML IJ SOLN: 0.5000 mg | INTRAMUSCULAR | Status: DC | PRN

## 2019-07-21 MED ORDER — METHYLPREDNISOLONE SODIUM SUCC 125 MG IJ SOLR
80.0000 mg | Freq: Once | INTRAMUSCULAR | Status: AC
  Administered 2019-07-21: 80 mg via INTRAVENOUS
  Filled 2019-07-21: qty 2

## 2019-07-21 MED ORDER — LORAZEPAM 1 MG PO TABS: 1.0000 mg | ORAL_TABLET | ORAL | Status: DC | PRN

## 2019-07-21 MED ORDER — MORPHINE SULFATE (PF) 2 MG/ML IV SOLN
1.0000 mg | INTRAVENOUS | Status: DC | PRN
  Administered 2019-07-21 – 2019-07-23 (×7): 1 mg via INTRAVENOUS
  Filled 2019-07-21 (×8): qty 1

## 2019-07-21 MED ORDER — POLYVINYL ALCOHOL 1.4 % OP SOLN
1.0000 [drp] | Freq: Four times a day (QID) | OPHTHALMIC | Status: DC | PRN
  Filled 2019-07-21: qty 15

## 2019-07-21 MED ORDER — GLYCOPYRROLATE 0.2 MG/ML IJ SOLN
0.2000 mg | INTRAMUSCULAR | Status: DC | PRN
  Administered 2019-07-22 – 2019-07-23 (×2): 0.2 mg via INTRAVENOUS
  Filled 2019-07-21 (×2): qty 1

## 2019-07-21 MED ORDER — BIOTENE DRY MOUTH MT LIQD: 15.0000 mL | OROMUCOSAL | Status: DC | PRN

## 2019-07-21 MED ORDER — ALBUTEROL SULFATE (2.5 MG/3ML) 0.083% IN NEBU: 2.5000 mg | INHALATION_SOLUTION | RESPIRATORY_TRACT | Status: DC | PRN

## 2019-07-21 MED ORDER — ONDANSETRON HCL 4 MG/2ML IJ SOLN: 4.0000 mg | Freq: Four times a day (QID) | INTRAMUSCULAR | Status: DC | PRN

## 2019-07-21 MED ORDER — DIPHENHYDRAMINE HCL 50 MG/ML IJ SOLN: 12.5000 mg | INTRAMUSCULAR | Status: DC | PRN

## 2019-07-21 NOTE — ED Provider Notes (Signed)
[facility] EMERGENCY DEPARTMENT Provider Note   CSN: [id number] Arrival date & time: 07/21/19  5361     History Chief Complaint  Patient presents with  . Shortness of Breath    [name] is a 86 y.o. female.  Patient is an 86 year old female with extensive past medical history including recurrent non-small cell lung cancer, radiation pneumonitis, atrial fibrillation, and recent admission for COVID-19.  She presents today for evaluation of shortness of breath.  Patient lives by herself at home with her daughter next door.  Patient states that her breathing began to worsen yesterday.  EMS was called this morning and she was found to have oxygen saturations in the 60s on room air.  She was placed on a nonrebreather, then transported here.  Patient does report some cough but denies fevers or chills.  She denies chest pain or leg swelling.  The history is provided by the patient.  Shortness of Breath Severity:  Moderate Onset quality:  Sudden Duration:  12 hours Timing:  Constant Progression:  Worsening Chronicity:  New Context: URI   Relieved by:  Nothing Worsened by:  Nothing Ineffective treatments: albuterol by nebulizer. Associated symptoms: cough   Associated symptoms: no fever and no sputum production        Past Medical History:  Diagnosis Date  . Atrial fibrillation ([place])   . FHx: chemotherapy 2008&2011   4 cycles cisplatin,taxotere,2008& carboplatin and Alimta 2011  . History of migraine headaches   . Hypercholesterolemia   . lung ca dx'd 06/2006   rt and lt lung  . Lung cancer ([place]) 3/14/1   right- Adenocarcinoma w/bronchioalveolar features  . Radiation 11/01/15-11/14/15   right upper lobe 30 gray  . Radiation pneumonitis ([place]) 01/24/2016    Patient Active Problem List   Diagnosis Date Noted  . Palliative care by specialist   . Weakness   . Protein-calorie malnutrition, severe 07/16/2019  . Multifocal atrial tachycardia ([place])  07/15/2019  . Acute on chronic respiratory failure ([place]) 07/15/2019  . Chronic systolic heart failure ([place]) 07/01/2019  . Takotsubo cardiomyopathy 07/01/2019  . Elevated troponin   . ACS (acute coronary syndrome) ([place]) 06/23/2019  . COVID-19 virus detected 06/23/2019  . Acetabular fracture ([place]) 04/14/2019  . 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 Port-A-Cath in place 12/10/2017  . Goals of care, counseling/discussion 09/10/2017  . Encounter for antineoplastic immunotherapy 09/10/2017  . Bronchitis 08/16/2016  . Radiation pneumonitis ([place]) 01/24/2016  . Encounter for antineoplastic chemotherapy 12/14/2014  . Bilateral lung cancer ([place]) 04/08/2011    Past Surgical History:  Procedure Laterality Date  . CATARACT EXTRACTION  2013   bilateral  . EMBOLECTOMY Right 03/13/2019   Procedure: RIGHT BRACHIAL, RADIAL, ULNAR EMBOLECTOMY;  Surgeon: Elam Dutch, MD;  Location: [facility] OR;  Service: Vascular;  Laterality: Right;  . left  lowerlung lobectomy Left 06/19/2006   Dr.Burney,Left lower lobectomy  . LEFT HEART CATH AND CORONARY ANGIOGRAPHY N/A 06/25/2019   Procedure: LEFT HEART CATH AND CORONARY ANGIOGRAPHY;  Surgeon: Martinique, Peter M, MD;  Location: [facility];  Service: Cardiovascular;  Laterality: N/A;  . MYRINGOTOMY Bilateral 06/25/2016  . Porta-cath  2011     OB History   No obstetric history on file.     Family History  Problem Relation Age of Onset  . Hypertension Father     Social History   Tobacco Use  . Smoking status: Never Smoker  . Smokeless tobacco: Never Used  Substance Use Topics  .  Alcohol use: No  . Drug use: No    Home Medications Prior to Admission medications   Medication Sig Start Date End Date Taking? Authorizing Provider  albuterol (PROVENTIL) (2.5 MG/3ML) 0.083% nebulizer solution Take 3 mLs (2.5 mg total) by nebulization every 6 (six) hours as needed for wheezing or shortness of breath. 07/18/19   Kayleen Memos, DO  albuterol  (VENTOLIN HFA) 108 (90 Base) MCG/ACT inhaler Inhale 2 puffs into the lungs every 6 (six) hours as needed for wheezing or shortness of breath.    [provider]  amiodarone (PACERONE) 100 MG tablet Take 1 tablet (100 mg total) by mouth daily. 07/19/19 09/17/19  Kayleen Memos, DO  apixaban (ELIQUIS) 2.5 MG TABS tablet Take 1 tablet (2.5 mg total) by mouth 2 (two) times daily. 07/18/19 09/16/19  Kayleen Memos, DO  benzonatate (TESSALON) 100 MG capsule Take 1 capsule (100 mg total) by mouth 3 (three) times daily as needed for up to 4 days for cough. 07/18/19 07/22/19  Kayleen Memos, DO  feeding supplement, ENSURE ENLIVE, (ENSURE ENLIVE) LIQD Take 237 mLs by mouth 2 (two) times daily between meals for 7 days. 07/18/19 07/25/19  Kayleen Memos, DO  Ipratropium-Albuterol (COMBIVENT RESPIMAT) 20-100 MCG/ACT AERS respimat Inhale 1 puff into the lungs every 6 (six) hours.    [provider]  metoprolol succinate (TOPROL-XL) 25 MG 24 hr tablet Take 1 tablet (25 mg total) by mouth daily. 07/19/19 09/17/19  Kayleen Memos, DO  mometasone-formoterol (DULERA) 100-5 MCG/ACT AERO Inhale 2 puffs into the lungs in the morning and at bedtime. 07/19/19   Aline August, MD  Multiple Vitamin (MULTIVITAMIN WITH MINERALS) TABS tablet Take 1 tablet by mouth daily.    [provider]  predniSONE (DELTASONE) 10 MG tablet Take 1 tablet (10 mg total) by mouth daily with breakfast for 3 days. 07/19/19 07/22/19  Kayleen Memos, DO  sertraline (ZOLOFT) 25 MG tablet Take 25 mg by mouth daily. 07/02/19   [provider]  vitamin C (ASCORBIC ACID) 500 MG tablet Take 500 mg by mouth daily.    [provider]    Allergies    Simvastatin  Review of Systems   Review of Systems  Constitutional: Negative for fever.  Respiratory: Positive for cough and shortness of breath. Negative for sputum production.   All other systems reviewed and are negative.   Physical Exam Updated Vital Signs BP (!)  128/98   Pulse 77   Temp (!) 97.5 F (36.4 C) (Oral)   Resp 16   Ht 5\' 2"  (1.575 m)   Wt 43.1 kg   SpO2 100%   BMI 17.38 kg/m   Physical Exam Vitals and nursing note reviewed.  Constitutional:      General: She is not in acute distress.    Appearance: She is well-developed. She is not diaphoretic.     Comments: Patient is a elderly female, appears cachectic and chronically ill.  HENT:     Head: Normocephalic and atraumatic.  Cardiovascular:     Rate and Rhythm: Normal rate and regular rhythm.     Heart sounds: No murmur. No friction rub. No gallop.   Pulmonary:     Effort: Pulmonary effort is normal. No respiratory distress.     Breath sounds: Normal breath sounds. No wheezing.  Abdominal:     General: Bowel sounds are normal. There is no distension.     Palpations: Abdomen is soft.     Tenderness: There  is no abdominal tenderness.  Musculoskeletal:        General: Normal range of motion.     Cervical back: Normal range of motion and neck supple.  Skin:    General: Skin is warm and dry.  Neurological:     Mental Status: She is alert and oriented to person, place, and time.     ED Results / Procedures / Treatments   Labs (all labs ordered are listed, but only abnormal results are displayed) Labs Reviewed  BASIC METABOLIC PANEL  CBC WITH DIFFERENTIAL/PLATELET  BRAIN NATRIURETIC PEPTIDE  TROPONIN I (HIGH SENSITIVITY)    EKG EKG Interpretation  Date/Time:  Wednesday July 21 2019 07:44:38 EST Ventricular Rate:  76 PR Interval:    QRS Duration: 79 QT Interval:  419 QTC Calculation: 472 R Axis:   -65 Text Interpretation: Sinus rhythm Consider right atrial enlargement Left anterior fascicular block LVH with secondary repolarization abnormality No significant change since 07/16/2019 Confirmed by Veryl Speak [phone]) on 07/21/2019 7:47:47 AM   Radiology No results found.  Procedures Procedures (including critical care time)  Medications Ordered in  ED Medications  albuterol (VENTOLIN HFA) 108 (90 Base) MCG/ACT inhaler 4 puff (has no administration in time range)  methylPREDNISolone sodium succinate (SOLU-MEDROL) 125 mg/2 mL injection 80 mg (has no administration in time range)    ED Course  I have reviewed the triage vital signs and the nursing notes.  Pertinent labs & imaging results that were available during my care of the patient were reviewed by me and considered in my medical decision making (see chart for details).    MDM Rules/Calculators/A&P  Patient is a an 86 year old female with past medical history of lung cancer with radiation pneumonitis and recent diagnosis of COVID-19.  She presents today for evaluation of shortness of breath.  She was found to be hypoxic with oxygen saturations in the 60s at home.  She was placed on a nonrebreather and transported here.  Patient is acutely on chronically ill-appearing.  She becomes tachypneic and hypoxic when her oxygen is turned off.  Laboratory studies show an elevated BNP but are otherwise unremarkable.  Her chest x-ray shows a worsening patchy left lung airspace opacification at radiology please could be related to COVID-19.  Due to the hypoxia, patient to be admitted to the hospitalist service.  She has been given albuterol and Solu-Medrol.  CRITICAL CARE Performed by: Veryl Speak Total critical care time: 35 minutes Critical care time was exclusive of separately billable procedures and treating other patients. Critical care was necessary to treat or prevent imminent or life-threatening deterioration. Critical care was time spent personally by me on the following activities: development of treatment plan with patient and/or surrogate as well as nursing, discussions with consultants, evaluation of patient's response to treatment, examination of patient, obtaining history from patient or surrogate, ordering and performing treatments and interventions, ordering and review of  laboratory studies, ordering and review of radiographic studies, pulse oximetry and re-evaluation of patient's condition.   Final Clinical Impression(s) / ED Diagnoses Final diagnoses:  None    Rx / DC Orders ED Discharge Orders    None       Veryl Speak, MD 07/21/19 1116

## 2019-07-21 NOTE — Progress Notes (Signed)
Nutrition Brief Note  Chart reviewed. Pt now transitioning to comfort care.  No further nutrition interventions warranted at this time.  Please re-consult as needed.   [name] W, RD, LDN, CDCES Registered Dietitian II Certified Diabetes Care and Education Specialist Please refer to [facility] for RD and/or RD on-call/weekend/after hours pager  

## 2019-07-21 NOTE — H&P (Signed)
History and Physical    [name] XFG:[id number] DOB: [birth year] DOA: 07/21/2019  PCP: [name], MD Consultants:  [facility] - oncology; Xu - orthopedics; Palliative care; [facility] - vascular; [facility] - cardiology Patient coming from:  Home - lives alone; NOK: Daughter, [name], [phone]; [name], [phone]  Chief Complaint: SOB  HPI: [name] is a 86 y.o. female with medical history significant of lung cancer s/p chemo (2008, 2011, now on maintenance immunotherapy)/radiation therapy (2017); HLD; Takotsubo cardiomyopathy; COVID infection (06/02/19); and afib presenting with SOB.  She was last admitted from 2/10-15 for acute hypoxic respiratory failure, possibly as a COPD exacerbation.  She was discharged on home O2 and sent home with palliative care consultation.  She was recommended to transition to hospice/comfort measures if she did not show clinical improvement.  Initial palliative care visit was scheduled for 2/18.  I spoke with her daughter, [name].  They were scheduled to meet with palliative care tomorrow (Zoom).  Goal is for her to come home and do what they can to make her comfortable.  If hospice needs to come in, they can do that.  She said yesterday she is ready to go.  She is not eating, family is encouraging her to drink Ensure.  No antibiotics.    ED Course:  Recurrent lung cancer, COVID hospitalization in Dec.  SOB, placed on 10L by EMS.  Appears to have worsening left-sided infiltrate on the left - ?COVID PNA.  Review of Systems: As per HPI; otherwise review of systems reviewed and negative.    Past Medical History:  Diagnosis Date  . Atrial fibrillation ([place])   . FHx: chemotherapy 2008&2011   4 cycles cisplatin,taxotere,2008& carboplatin and Alimta 2011  . History of migraine headaches   . Hypercholesterolemia   . lung ca dx'd 06/2006   rt and lt lung  . Lung cancer ([place]) 3/14/1   right- Adenocarcinoma w/bronchioalveolar features  .  Radiation 11/01/15-11/14/15   right upper lobe 30 gray  . Radiation pneumonitis ([place]) 01/24/2016    Past Surgical History:  Procedure Laterality Date  . CATARACT EXTRACTION  2013   bilateral  . EMBOLECTOMY Right 03/13/2019   Procedure: RIGHT BRACHIAL, RADIAL, ULNAR EMBOLECTOMY;  Surgeon: Elam Dutch, MD;  Location: [facility] OR;  Service: Vascular;  Laterality: Right;  . left  lowerlung lobectomy Left 06/19/2006   Dr.Burney,Left lower lobectomy  . LEFT HEART CATH AND CORONARY ANGIOGRAPHY N/A 06/25/2019   Procedure: LEFT HEART CATH AND CORONARY ANGIOGRAPHY;  Surgeon: Martinique, Peter M, MD;  Location: [place] CV LAB;  Service: Cardiovascular;  Laterality: N/A;  . MYRINGOTOMY Bilateral 06/25/2016  . Porta-cath  2011    Social History   Socioeconomic History  . Marital status: Widowed    Spouse name: Not on file  . Number of children: 2  . Years of education: Not on file  . Highest education level: Not on file  Occupational History  . Not on file  Tobacco Use  . Smoking status: Never Smoker  . Smokeless tobacco: Never Used  Substance and Sexual Activity  . Alcohol use: No  . Drug use: No  . Sexual activity: Not Currently  Other Topics Concern  . Not on file  Social History Narrative  . Not on file   Social Determinants of Health   Financial Resource Strain:   . Difficulty of Paying Living Expenses: Not on file  Food Insecurity:   . Worried About Charity fundraiser in the Last Year: Not  on file  . Ran Out of Food in the Last Year: Not on file  Transportation Needs:   . Lack of Transportation (Medical): Not on file  . Lack of Transportation (Non-Medical): Not on file  Physical Activity:   . Days of Exercise per Week: Not on file  . Minutes of Exercise per Session: Not on file  Stress:   . Feeling of Stress : Not on file  Social Connections:   . Frequency of Communication with Friends and Family: Not on file  . Frequency of Social Gatherings with Friends and Family:  Not on file  . Attends Religious Services: Not on file  . Active Member of Clubs or Organizations: Not on file  . Attends Archivist Meetings: Not on file  . Marital Status: Not on file  Intimate Partner Violence:   . Fear of Current or Ex-Partner: Not on file  . Emotionally Abused: Not on file  . Physically Abused: Not on file  . Sexually Abused: Not on file    Allergies  Allergen Reactions  . Simvastatin Other (See Comments)    Cluster migraines    Family History  Problem Relation Age of Onset  . Hypertension Father     Prior to Admission medications   Medication Sig Start Date End Date Taking? Authorizing Provider  albuterol (PROVENTIL) (2.5 MG/3ML) 0.083% nebulizer solution Take 3 mLs (2.5 mg total) by nebulization every 6 (six) hours as needed for wheezing or shortness of breath. 07/18/19  Yes Hall, Carole N, DO  albuterol (VENTOLIN HFA) 108 (90 Base) MCG/ACT inhaler Inhale 2 puffs into the lungs every 6 (six) hours as needed for wheezing or shortness of breath.   Yes [provider]  amiodarone (PACERONE) 100 MG tablet Take 1 tablet (100 mg total) by mouth daily. 07/19/19 09/17/19 Yes Kayleen Memos, DO  apixaban (ELIQUIS) 2.5 MG TABS tablet Take 1 tablet (2.5 mg total) by mouth 2 (two) times daily. 07/18/19 09/16/19 Yes Hall, Carole N, DO  benzonatate (TESSALON) 100 MG capsule Take 1 capsule (100 mg total) by mouth 3 (three) times daily as needed for up to 4 days for cough. 07/18/19 07/22/19 Yes Hall, Lorenda Cahill, DO  feeding supplement, ENSURE ENLIVE, (ENSURE ENLIVE) LIQD Take 237 mLs by mouth 2 (two) times daily between meals for 7 days. 07/18/19 07/25/19 Yes Hall, Carole N, DO  Ipratropium-Albuterol (COMBIVENT RESPIMAT) 20-100 MCG/ACT AERS respimat Inhale 1 puff into the lungs every 6 (six) hours.   Yes [provider]  metoprolol succinate (TOPROL-XL) 25 MG 24 hr tablet Take 1 tablet (25 mg total) by mouth daily. 07/19/19 09/17/19 Yes Kayleen Memos, DO    Multiple Vitamin (MULTIVITAMIN WITH MINERALS) TABS tablet Take 1 tablet by mouth daily.   Yes [provider]  predniSONE (DELTASONE) 10 MG tablet Take 1 tablet (10 mg total) by mouth daily with breakfast for 3 days. 07/19/19 07/22/19 Yes Hall, Carole N, DO  sertraline (ZOLOFT) 25 MG tablet Take 25 mg by mouth daily. 07/02/19  Yes [provider]  vitamin C (ASCORBIC ACID) 500 MG tablet Take 500 mg by mouth daily.   Yes [provider]  mometasone-formoterol (DULERA) 100-5 MCG/ACT AERO Inhale 2 puffs into the lungs in the morning and at bedtime. 07/19/19   Aline August, MD    Physical Exam: Vitals:   07/21/19 1249 07/21/19 1252 07/21/19 1330 07/21/19 1433  BP: 138/73   (!) 126/53  Pulse: 72 72 71 69  Resp: (!) 28 (!)  21 (!) 24 (!) 22  Temp:      TempSrc:      SpO2: 100% 100% 100% 100%  Weight:      Height:         . General:  Appears calm and comfortable and is NAD; she is chronically ill appearing and quite cachectic . Eyes:  PERRL, EOMI, normal lids, iris . ENT:  grossly normal hearing, lips & tongue, mmm . Neck:  no LAD, masses or thyromegaly . Cardiovascular:  RRR, no m/r/g. No LE edema.  Marland Kitchen Respiratory:   Coarse rhonchi with scattered wheezing and generally poor air movement.  Mildly increased respiratory effort with accessory muscle use with 100% O2 sats on NRB . Abdomen:  soft, NT, ND, NABS . Skin:  no rash or induration seen on limited exam . Musculoskeletal:  grossly normal tone BUE/BLE, good ROM, no bony abnormality . Psychiatric:  blunted mood and affect, speech fluent and appropriate but limited by dyspnea, AOx3 . Neurologic:  CN 2-12 grossly intact, moves all extremities in coordinated fashion    Radiological Exams on Admission: DG Chest Port 1 View  Result Date: 07/21/2019 CLINICAL DATA:  Shortness of breath. History of lung cancer. COVID positive January 2021. EXAM: PORTABLE CHEST 1 VIEW COMPARISON:  07/16/2019 and CT chest 06/23/2019.   PET 06/16/2019. FINDINGS: Trachea is midline. Right IJ power port tip is at the SVC RA junction. Heart size normal. Thoracic aorta is calcified. Masslike consolidation in the low right paratracheal station, as on 06/23/2019. Overall volume loss in the right hemithorax with blunting of the right costophrenic angle. There is patchy airspace consolidation in the left lung, progressive from 07/16/2019. Tiny left pleural effusion. No pneumothorax. IMPRESSION: 1. Worsening patchy left lung airspace opacification is likely due to COVID-19 pneumonia. 2. Masslike consolidation in the medial right hemithorax, better seen and discussed on PET 06/16/2019. 3. Tiny left pleural effusion. Electronically Signed   By: Lorin Picket M.D.   On: 07/21/2019 08:40    EKG: Independently reviewed.  NSR with rate 76; LAFB, LVH with no evidence of acute ischemia   Labs on Admission: I have personally reviewed the available labs and imaging studies at the time of the admission.  Pertinent labs:   Na++ 131 - improved Glucose 144 BNP 1284.7; 452.2 on 2/10 HS troponin 45 - improved WBC 9.7 COVID-19 positive on 12/30   Assessment/Plan Principal Problem:   End of life care Active Problems:   Bilateral lung cancer ([place])   History of [id number]   Chronic systolic heart failure (HCC)   Takotsubo cardiomyopathy   Acute on chronic respiratory failure (HCC)   DNR (do not resuscitate)   End of life care -Patient with prolonged h/o lung cancer that has been refractory to therapy -Last Opdivo (immunotherapy) treatment was Dec 23 -She had hip fracture (04/2019); Takotsubo cardiomyopathy (05/2019); and COVID-19 infection (06/02/19) which likely further contributed to her downward progression -She was planning to meet with palliative care for her initial visit tomorrow but appears to be declining too rapidly and appears appropriate for hospice -After long discussion in the ER with the patient and her daughters, family has  decided to proceed with comfort care only -She was admitted to [facility] for comfort care and palliative care consult -Patient is likely to be a candidate for [employer] or other residential hospice, as her reserve is likely quite low given her age and overall frailty -However, upon further discussion, the patient and her family would prefer home hospice -  Comfort care order set utilized -No antibiotics or IVF as per family's request -Pain control with morphine as needed, would transition to a drip if needed but patient does not appear to be in pain at this time -I reviewed all of her home medications with the daughter and the daughter opted to not continue them - including her heart medications and Eliquis    DVT prophylaxis: None - comfort measures Code Status: DNR - confirmed with family Family Communication: I spoke with both of her daughters by telephone and also in person with one daughter in person at the time of admission Disposition Plan: Anticipate in-hospital death vs. Home with hospice Consults called: Hospice Admission status: Admit - It is my clinical opinion that admission to INPATIENT is reasonable and necessary because of the expectation that this patient will require hospital care that crosses at least 2 midnights to treat this condition based on the medical complexity of the problems presented.  Given the aforementioned information, the predictability of an adverse outcome is felt to be significant.     Karmen Bongo MD [facility]   How to contact the [facility] Attending or Consulting provider [place] or covering provider during after hours [place], for this patient?  1. Check the care team in [facility] and look for a) attending/consulting [facility] provider listed and b) the [facility] team listed 2. Log into [email or web address] and use [place]'s universal password to access. If you do not have the password, please contact the hospital operator. 3. Locate the [facility] provider you are looking for  under [facility] and page to a number that you can be directly reached. 4. If you still have difficulty reaching the provider, please page the [facility] (Director on Call) for the Hospitalists listed on amion for assistance.   07/21/2019, 6:12 PM

## 2019-07-21 NOTE — Progress Notes (Signed)
This chaplain checked in on consult for spiritual care by phone.  The Pt. RN-Josephine shared no needs at this time.  F/U on Thursday morning will be appreciated.

## 2019-07-21 NOTE — ED Triage Notes (Signed)
Per [facility] pt from home. Hx of lung cancer pulse oxygen was reading in 60s. 100% on 10L NRB. Hx of a-fib, pt in afib RVR en route. EMS gave 254mL NS in route.

## 2019-07-21 NOTE — Progress Notes (Signed)
Patient's family asked if patient's dog could visit. Updated vaccine records place on patient's chart.

## 2019-07-21 NOTE — Progress Notes (Addendum)
Received patient from ED. Patient alert and oriented. Non-rebreather in place on 10L, patient 02 100%. Able to wean patient to 5L nonrebreather at 100%. No complaints of pain at this time. Patient made comfortable in bed. Daughter at the bedside. Will continue to monitor.  [place] to patient's room requesting O2 be increased. Increased NRB to 7L for patient's comfort.

## 2019-07-21 NOTE — ED Notes (Signed)
Put patient on bed pan changed patient brief it was wet patient is resting with call bell in reach and family at bedside

## 2019-07-22 ENCOUNTER — Encounter: Payer: Medicare PPO | Admitting: Nutrition

## 2019-07-22 ENCOUNTER — Other Ambulatory Visit: Payer: Medicare Other

## 2019-07-22 ENCOUNTER — Ambulatory Visit: Payer: Medicare Other

## 2019-07-22 ENCOUNTER — Ambulatory Visit: Payer: Medicare Other | Admitting: Physician Assistant

## 2019-07-22 ENCOUNTER — Other Ambulatory Visit: Payer: Medicare PPO | Admitting: Internal Medicine

## 2019-07-22 DIAGNOSIS — Z8616 Personal history of COVID-19: Secondary | ICD-10-CM

## 2019-07-22 DIAGNOSIS — C3491 Malignant neoplasm of unspecified part of right bronchus or lung: Secondary | ICD-10-CM

## 2019-07-22 DIAGNOSIS — J9621 Acute and chronic respiratory failure with hypoxia: Secondary | ICD-10-CM

## 2019-07-22 DIAGNOSIS — I5181 Takotsubo syndrome: Secondary | ICD-10-CM

## 2019-07-22 DIAGNOSIS — C3492 Malignant neoplasm of unspecified part of left bronchus or lung: Secondary | ICD-10-CM

## 2019-07-22 MED ORDER — WHITE PETROLATUM EX OINT
TOPICAL_OINTMENT | CUTANEOUS | Status: AC
  Filled 2019-07-22: qty 28.35

## 2019-07-22 NOTE — Progress Notes (Signed)
I responded to spiritual care consult for pastoral support. Savahna was resting with minimal talking. Daughter Sharyn Lull was at bedside. I offered them space to voice concerns and questions. Sharyn Lull said she was tired. She said this had been a long fight with many years of cancer for her mom. But she says that she knows where her mom is going. She says she is dealing with the change, but her younger sister is taking it difficult. Sharyn Lull noted their plan to be discharged for Paityn to be taken care of at home.   She said that her mom is [place] and stated that Danyah's affairs concerning her pastoral needs at home are in order. Hoyle Sauer requested prayer. I prayed with them, offered words of comfort, and ministry of presence.   Palliative care Resident Chaplain   Fidel Levy [phone]

## 2019-07-22 NOTE — Progress Notes (Signed)
Hydrologist [facility]) Hospital Liaison: RN note     Notified by Dr. [name] of patient/family request for [facility] services at home after discharge. Chart and patient information reviewed by [facility] physician. Hospice eligibility confirmed.     Writer spoke with daughter Sharyn Lull to initiate education related to hospice philosophy, services and team approach to care.           Sharyn Lull verbalized understanding of information given. Per discussion, plan is for discharge to home by [facility].   Please send signed and completed DNR form home with patient/family. Patient will need prescriptions for discharge comfort medications.      DME needs have been discussed, patient currently has the following equipment in the home: rolling walker, W/C.  Patient/family requests the following DME for delivery to the home:  Oxygen, Bed, 3N1. [place] equipment manager has been notified and will contact DME provider to arrange delivery to the home. Home address has been verified and is correct in the chart. Sharyn Lull is the family member to contact to arrange time of delivery.      [facility] aware of the above. Please notify [facility] when patient is ready to leave the unit at discharge. (Call [phone] or [phone] after 5pm.) [facility] information and contact numbers given to  [facility].       Please call with any hospice related questions.      Thank you for this referral.      Farrel Gordon, RN, [facility] (listed on AMION under Hospice and [place] [facility] [place])   [phone]

## 2019-07-22 NOTE — Progress Notes (Signed)
Patient was scheduled to establish with Palliative care, but was admitted to hospital and subsequently referred to Hospice.  [name] Gelinas NP-C [phone]

## 2019-07-22 NOTE — Progress Notes (Signed)
PROGRESS NOTE  [name] WUJ:[id number] DOB: 07-30-1932 DOA: 07/21/2019 PCP: [name], MD  HPI/Recap of past 24 hours: HPI from [name] is a 86 y.o. female with medical history significant of lung cancer s/p chemo (2008, 2011,on maintenance immunotherapy)/radiation therapy (2017); HLD; Takotsubo cardiomyopathy; COVID infection (06/02/19); and afib presenting with SOB. She was last admitted from 2/10-15 for acute hypoxic respiratory failure, possibly as a COPD exacerbation.  She was discharged on home O2 and sent home with palliative care consultation.  She was recommended to transition to hospice/comfort measures if she did not show clinical improvement.  Initial palliative care visit was scheduled for 2/18.  Admitting physician Dr. Lorin [name], discussed extensively with family, the goal is to make her comfortable at home.  Hospice on board   Today, met patient with daughter at bedside.  Patient sleeping comfortably.  Daughter voiced no concerns/needs.    Assessment/Plan: Principal Problem:   End of life care Active Problems:   Bilateral lung cancer ([place])   History of NFAOZ-30   Chronic systolic heart failure ([facility])   Takotsubo cardiomyopathy   Acute on chronic respiratory failure ([place])   DNR (do not resuscitate)   End-of-life care Hospice on board Family has decided on home hospice, awaiting DME bed to be delivered, likely 07/23/2019, patient will be requiring home O2 as well upon discharge Continue comfort care measures, pain management Patient/family declines antibiotics or IV fluids, or any other home medications that are not for comfort care       Malnutrition Type:      Malnutrition Characteristics:      Nutrition Interventions:       Estimated body mass index is 17.38 kg/m as calculated from the following:   Height as of this encounter: '5\' 2"'  (1.575 m).   Weight as of this encounter: 43.1 kg.      Code Status: DNR  Family  Communication: Daughter at bedside, discussed plan of care, all questions/concerns addressed  Disposition Plan: Likely home on 07/23/2019 once hospital bed is delivered at home   Consultants:  None  Procedures:  None  Antimicrobials:  None  DVT prophylaxis: None   Objective: Vitals:   07/21/19 1330 07/21/19 1433 07/21/19 1530 07/22/19 0541  BP:  (!) 126/53  127/64  Pulse: 71 69  68  Resp: (!) 24 (!) 22  14  Temp:    (!) 97.3 F (36.3 C)  TempSrc:    Oral  SpO2: 100% 100% 100% 100%  Weight:      Height:       No intake or output data in the 24 hours ending 07/22/19 1253 Filed Weights   07/21/19 0742  Weight: 43.1 kg    Exam:  General: NAD, sleeping comfortably, chronically ill-appearing, cachectic  Cardiovascular: S1, S2 present  Respiratory:  Poor inspiratory effort  Abdomen: Soft, nontender, nondistended, bowel sounds present  Musculoskeletal: No bilateral pedal edema noted  Skin: Normal  Psychiatry:  Unable to assess   Data Reviewed: CBC: Recent Labs  Lab 07/16/19 0446 07/17/19 0619 07/21/19 0820  WBC 7.7 7.4 9.7  NEUTROABS  --  5.8 8.5*  HGB 12.7 12.7 12.8  HCT 37.4 37.9 39.2  MCV 96.6 96.7 99.2  PLT 335 380 865   Basic Metabolic Panel: Recent Labs  Lab 07/16/19 0446 07/16/19 0446 07/16/19 1104 07/17/19 0619 07/18/19 0707 07/19/19 0851 07/21/19 0820  NA 128*   < > 128* 128* 128* 127* 131*  K 6.0*   < >  4.1 4.1 3.5 3.4* 4.0  CL 95*   < > 91* 92* 92* 87* 89*  CO2 25   < > '26 28 28 28 29  ' GLUCOSE 148*   < > 138* 123* 96 174* 144*  BUN 14   < > '13 16 20 23 19  ' CREATININE 0.78   < > 0.72 0.76 0.70 0.75 0.54  CALCIUM 8.9   < > 8.3* 8.4* 8.5* 8.4* 8.3*  MG 1.8  --   --  2.2 1.9 1.9  --   PHOS  --   --  3.5 4.1 2.9  --   --    < > = values in this interval not displayed.   GFR: Estimated Creatinine Clearance: 35.6 mL/min (by C-G formula based on SCr of 0.54 mg/dL). Liver Function Tests: Recent Labs  Lab 07/17/19 0619  AST 37   ALT 28  ALKPHOS 118  BILITOT 0.9  PROT 6.7  ALBUMIN 2.3*   No results for input(s): LIPASE, AMYLASE in the last 168 hours. No results for input(s): AMMONIA in the last 168 hours. Coagulation Profile: No results for input(s): INR, PROTIME in the last 168 hours. Cardiac Enzymes: No results for input(s): CKTOTAL, CKMB, CKMBINDEX, TROPONINI in the last 168 hours. BNP (last 3 results) No results for input(s): PROBNP in the last 8760 hours. HbA1C: No results for input(s): HGBA1C in the last 72 hours. CBG: No results for input(s): GLUCAP in the last 168 hours. Lipid Profile: No results for input(s): CHOL, HDL, LDLCALC, TRIG, CHOLHDL, LDLDIRECT in the last 72 hours. Thyroid Function Tests: No results for input(s): TSH, T4TOTAL, FREET4, T3FREE, THYROIDAB in the last 72 hours. Anemia Panel: No results for input(s): VITAMINB12, FOLATE, FERRITIN, TIBC, IRON, RETICCTPCT in the last 72 hours. Urine analysis:    Component Value Date/Time   COLORURINE YELLOW 07/14/2019 2340   APPEARANCEUR HAZY (A) 07/14/2019 2340   LABSPEC 1.012 07/14/2019 2340   LABSPEC 1.030 03/17/2012 1051   PHURINE 6.0 07/14/2019 2340   GLUCOSEU 50 (A) 07/14/2019 2340   HGBUR NEGATIVE 07/14/2019 2340   BILIRUBINUR NEGATIVE 07/14/2019 2340   BILIRUBINUR Negative 03/17/2012 1051   KETONESUR NEGATIVE 07/14/2019 2340   PROTEINUR 30 (A) 07/14/2019 2340   NITRITE NEGATIVE 07/14/2019 2340   LEUKOCYTESUR TRACE (A) 07/14/2019 2340   LEUKOCYTESUR Negative 03/17/2012 1051   Sepsis Labs: '@LABRCNTIP' (procalcitonin:4,lacticidven:4)  ) Recent Results (from the past 240 hour(s))  SARS CORONAVIRUS 2 (TAT 6-24 HRS) Nasopharyngeal Nasopharyngeal Swab     Status: None   Collection Time: 07/15/19  2:22 AM   Specimen: Nasopharyngeal Swab  Result Value Ref Range Status   SARS Coronavirus 2 NEGATIVE NEGATIVE Final    Comment: (NOTE) SARS-CoV-2 target nucleic acids are NOT DETECTED. The SARS-CoV-2 RNA is generally detectable in  upper and lower respiratory specimens during the acute phase of infection. Negative results do not preclude SARS-CoV-2 infection, do not rule out co-infections with other pathogens, and should not be used as the sole basis for treatment or other patient management decisions. Negative results must be combined with clinical observations, patient history, and epidemiological information. The expected result is Negative. Fact Sheet for Patients: SugarRoll.be Fact Sheet for Healthcare Providers: https://www.woods-mathews.com/ This test is not yet approved or cleared by the [place] FDA and  has been authorized for detection and/or diagnosis of SARS-CoV-2 by FDA under an Emergency Use Authorization (EUA). This EUA will remain  in effect (meaning this test can be used) for the duration of the COVID-19 declaration under  Section 56 4(b)(1) of the Act, 21 U.S.C. section 360bbb-3(b)(1), unless the authorization is terminated or revoked sooner. Performed at [facility], [place] [address], [place], [place] [address]   Respiratory Panel by RT PCR (Flu A&B, Covid) - Nasopharyngeal Swab     Status: None   Collection Time: 07/21/19 10:37 AM   Specimen: Nasopharyngeal Swab  Result Value Ref Range Status   SARS Coronavirus 2 by RT PCR NEGATIVE NEGATIVE Final    Comment: (NOTE) SARS-CoV-2 target nucleic acids are NOT DETECTED. The SARS-CoV-2 RNA is generally detectable in upper respiratoy specimens during the acute phase of infection. The lowest concentration of SARS-CoV-2 viral copies this assay can detect is 131 copies/mL. A negative result does not preclude SARS-Cov-2 infection and should not be used as the sole basis for treatment or other patient management decisions. A negative result may occur with  improper specimen collection/handling, submission of specimen other than nasopharyngeal swab, presence of viral mutation(s) within the areas  targeted by this assay, and inadequate number of viral copies (<131 copies/mL). A negative result must be combined with clinical observations, patient history, and epidemiological information. The expected result is Negative. Fact Sheet for Patients:  PinkCheek.be Fact Sheet for Healthcare Providers:  GravelBags.it This test is not yet ap proved or cleared by the [place] FDA and  has been authorized for detection and/or diagnosis of SARS-CoV-2 by FDA under an Emergency Use Authorization (EUA). This EUA will remain  in effect (meaning this test can be used) for the duration of the COVID-19 declaration under Section 564(b)(1) of the Act, 21 U.S.C. section 360bbb-3(b)(1), unless the authorization is terminated or revoked sooner.    Influenza A by PCR NEGATIVE NEGATIVE Final   Influenza B by PCR NEGATIVE NEGATIVE Final    Comment: (NOTE) The Xpert Xpress SARS-CoV-2/FLU/RSV assay is intended as an aid in  the diagnosis of influenza from Nasopharyngeal swab specimens and  should not be used as a sole basis for treatment. Nasal washings and  aspirates are unacceptable for Xpert Xpress SARS-CoV-2/FLU/RSV  testing. Fact Sheet for Patients: PinkCheek.be Fact Sheet for Healthcare Providers: GravelBags.it This test is not yet approved or cleared by the [place] FDA and  has been authorized for detection and/or diagnosis of SARS-CoV-2 by  FDA under an Emergency Use Authorization (EUA). This EUA will remain  in effect (meaning this test can be used) for the duration of the  Covid-19 declaration under Section 564(b)(1) of the Act, 21  U.S.C. section 360bbb-3(b)(1), unless the authorization is  terminated or revoked. Performed at [facility], [place] [address] [place], [place] [address]       Studies: No results found.  Scheduled Meds: . feeding supplement  (ENSURE ENLIVE)  237 mL Oral BID BM    Continuous Infusions:   LOS: 1 day     Alma Friendly, MD [facility]  If 7PM-7AM, please contact night-coverage [email or web address] 07/22/2019, 12:53 PM

## 2019-07-23 MED ORDER — MORPHINE SULFATE 20 MG/5ML PO SOLN: 0.4000 mg | ORAL | 0 refills | Status: AC | PRN

## 2019-07-23 MED ORDER — LORAZEPAM 1 MG PO TABS: 0.5000 mg | ORAL_TABLET | ORAL | 0 refills | Status: AC | PRN

## 2019-07-23 NOTE — Discharge Summary (Addendum)
Discharge Summary  GERARDO [name] ASN:[id number] DOB: 11/11/1932  PCP: [name], MD  Admit date: 07/21/2019 Discharge date: 07/23/2019  Time spent: 40 mins  Recommendations for Outpatient Follow-up:  Hospice team/care  Discharge Diagnoses:  Active Hospital Problems   Diagnosis Date Noted   End of life care 07/21/2019   DNR (do not resuscitate) 07/21/2019   Acute on chronic respiratory failure ([place]) 19/37/9024   Chronic systolic heart failure ([place]) 07/01/2019   Takotsubo cardiomyopathy 07/01/2019   History of COVID-19 06/23/2019   Bilateral lung cancer ([place]) 04/08/2011    Resolved Hospital Problems  No resolved problems to display.    Discharge Condition: Stable  Diet recommendation: As tolerated   Vitals:   07/22/19 0541 07/23/19 0542  BP: 127/64 139/67  Pulse: 68 68  Resp: 14 18  Temp: (!) 97.3 F (36.3 C) 98.4 F (36.9 C)  SpO2: 100% 100%    History of present illness:  [name] is a 86 y.o. female with medical history significant of lung cancer s/p chemo (2008, 2011,on maintenance immunotherapy)/radiation therapy (2017); HLD; Takotsubo cardiomyopathy; COVID infection (06/02/19); and afib presenting with SOB. She was last admitted from 2/10-15 for acute hypoxic respiratory failure, possibly as a COPD exacerbation.  She was discharged on home O2 and sent home with palliative care consultation.  She was recommended to transition to hospice/comfort measures if she did not show clinical improvement.  Initial palliative care visit was scheduled for 2/18.  Admitting physician Dr. [name], discussed extensively with family, the goal is to make her comfortable at home.  Hospice on board.    Today, patient noted to be resting in bed.  Daughter at bedside.  Looked comfortable, was able to answer simple questions.    Hospital Course:  Principal Problem:   End of life care Active Problems:   Bilateral lung cancer ([place])   History of COVID-19   Chronic  systolic heart failure ([facility])   Takotsubo cardiomyopathy   Acute on chronic respiratory failure ([facility])   DNR (do not resuscitate)   End-of-life care Hospice on board Family has decided on home hospice Continue comfort care measures, pain management Patient/family declines antibiotics or IV fluids, or any other home medications that are not for comfort care Home with hospice care  Acute on chronic hypoxic respiratory failure Likely multifactorial Continue supplemental O2 Currently home hospice  Pneumonia likely 2/2 HCAP versus??  Persistent Covid pneumonia Currently home hospice       Malnutrition Type:      Malnutrition Characteristics:      Nutrition Interventions:      Estimated body mass index is 17.38 kg/m as calculated from the following:   Height as of this encounter: 5\' 2"  (1.575 m).   Weight as of this encounter: 43.1 kg.    Procedures: None  Consultations: None  Discharge Exam: BP 139/67 (BP Location: Left Arm)   Pulse 68   Temp 98.4 F (36.9 C) (Axillary)   Resp 18   Ht 5\' 2"  (1.575 m)   Wt 43.1 kg   SpO2 100%   BMI 17.38 kg/m   General: NAD, resting comfortably, chronically ill-appearing, cachectic Cardiovascular: S1, S2 present Respiratory: Poor inspiratory effort  Discharge Instructions You were cared for by a hospitalist during your hospital stay. If you have any questions about your discharge medications or the care you received while you were in the hospital after you are discharged, you can call the unit and asked to speak with the hospitalist on call  if the hospitalist that took care of you is not available. Once you are discharged, your primary care physician will handle any further medical issues. Please note that NO REFILLS for any discharge medications will be authorized once you are discharged, as it is imperative that you return to your primary care physician (or establish a relationship with a primary care physician if you do  not have one) for your aftercare needs so that they can reassess your need for medications and monitor your lab values.  Discharge Instructions     Diet - low sodium heart healthy   Complete by: As directed    Increase activity slowly   Complete by: As directed       Allergies as of 07/23/2019       Reactions   Simvastatin Other (See Comments)   Cluster migraines        Medication List     STOP taking these medications    amiodarone 100 MG tablet Commonly known as: PACERONE   apixaban 2.5 MG Tabs tablet Commonly known as: ELIQUIS   benzonatate 100 MG capsule Commonly known as: TESSALON   Combivent Respimat 20-100 MCG/ACT Aers respimat Generic drug: Ipratropium-Albuterol   Dulera 100-5 MCG/ACT Aero Generic drug: mometasone-formoterol   metoprolol succinate 25 MG 24 hr tablet Commonly known as: TOPROL-XL   multivitamin with minerals Tabs tablet   predniSONE 10 MG tablet Commonly known as: DELTASONE   sertraline 25 MG tablet Commonly known as: ZOLOFT   vitamin C 500 MG tablet Commonly known as: ASCORBIC ACID       TAKE these medications    albuterol 108 (90 Base) MCG/ACT inhaler Commonly known as: VENTOLIN HFA Inhale 2 puffs into the lungs every 6 (six) hours as needed for wheezing or shortness of breath.   albuterol (2.5 MG/3ML) 0.083% nebulizer solution Commonly known as: PROVENTIL Take 3 mLs (2.5 mg total) by nebulization every 6 (six) hours as needed for wheezing or shortness of breath.   feeding supplement (ENSURE ENLIVE) Liqd Take 237 mLs by mouth 2 (two) times daily between meals for 7 days.   LORazepam 1 MG tablet Commonly known as: ATIVAN Take 0.5 tablets (0.5 mg total) by mouth every 4 (four) hours as needed for anxiety.   morphine 20 MG/5ML solution Take 0.1 mLs (0.4 mg total) by mouth every 4 (four) hours as needed for up to 7 days for pain.       Allergies  Allergen Reactions   Simvastatin Other (See Comments)    Cluster  migraines       The results of significant diagnostics from this hospitalization (including imaging, microbiology, ancillary and laboratory) are listed below for reference.    Significant Diagnostic Studies: CT Angio Chest PE W and/or Wo Contrast  Result Date: 06/23/2019 CLINICAL DATA:  Respiratory distress. EXAM: CT ANGIOGRAPHY CHEST WITH CONTRAST TECHNIQUE: Multidetector CT imaging of the chest was performed using the standard protocol during bolus administration of intravenous contrast. Multiplanar CT image reconstructions and MIPs were obtained to evaluate the vascular anatomy. CONTRAST:  154mL OMNIPAQUE IOHEXOL 350 MG/ML SOLN COMPARISON:  May 24, 2019 FINDINGS: Cardiovascular: A right-sided venous Port-A-Cath is in place. There is moderate severity calcification of the aortic arch. Satisfactory opacification of the pulmonary arteries to the segmental level. While no intraluminal filling defects are identified there is suspected occlusion of upper lobe branches of the right pulmonary artery as a result of a large paramediastinal mass (see below). Normal heart size. No pericardial effusion. Marked severity  coronary artery calcification is seen. Mediastinum/Nodes: A stable approximately 10.6 cm x 4.2 cm right-sided paramediastinal soft tissue mass is seen. This contains small areas of calcification and is unchanged in size and appearance when compared to the prior study. Lungs/Pleura: Mild stable areas of patchy scarring and/or atelectasis are seen within the left upper lobe and right lower lobe. Additional new mild areas of atelectasis and/or infiltrate are seen within the left upper lobe and right lower lobe. A 2.7 cm x 1.7 cm area of patchy consolidation is seen along the posteromedial aspect of the right lower lobe (axial CT image 79, CT series number 11). This is increased in size when compared to the prior study. There is a small right pleural effusion. Upper Abdomen: No acute abnormality.  Musculoskeletal: Multilevel degenerative changes seen throughout the thoracic spine. Review of the MIP images confirms the above findings. IMPRESSION: 1. 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 2. Mild, stable areas of patchy left upper lobe and left lower lobe scarring and/or atelectasis with new areas of left upper lobe and right lower lobe atelectasis and/or infiltrate noted. 3. Patchy area of right lower lobe consolidation which is increased in size when compared to the prior study. While this may represent worsening infiltrate, sequelae associated with an underlying neoplastic process cannot be excluded. 4. No visible areas of pulmonary emboli. 5. Small right pleural effusion. Electronically Signed   By: Virgina Norfolk M.D.   On: 06/23/2019 22:29   CARDIAC CATHETERIZATION  Result Date: 06/25/2019  Prox LAD to Mid LAD lesion is 20% stenosed.  2nd Diag lesion is 80% stenosed.  LV end diastolic pressure is normal.  1. 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 Normal LVEDP Plan: diagonal disease does not explain her clinical picture which is more c/w stress induced CM. Recommend medical management.   DG Chest Port 1 View  Result Date: 07/21/2019 CLINICAL DATA:  Shortness of breath. History of lung cancer. COVID positive January 2021. EXAM: PORTABLE CHEST 1 VIEW COMPARISON:  07/16/2019 and CT chest 06/23/2019.  PET 06/16/2019. FINDINGS: Trachea is midline. Right IJ power port tip is at the SVC RA junction. Heart size normal. Thoracic aorta is calcified. Masslike consolidation in the low right paratracheal station, as on 06/23/2019. Overall volume loss in the right hemithorax with blunting of the right costophrenic angle. There is patchy airspace consolidation in the left lung, progressive from 07/16/2019. Tiny left pleural effusion. No pneumothorax.  IMPRESSION: 1. Worsening patchy left lung airspace opacification is likely due to COVID-19 pneumonia. 2. Masslike consolidation in the medial right hemithorax, better seen and discussed on PET 06/16/2019. 3. Tiny left pleural effusion. Electronically Signed   By: Lorin Picket M.D.   On: 07/21/2019 08:40   DG CHEST PORT 1 VIEW  Result Date: 07/16/2019 CLINICAL DATA:  Shortness of breath EXAM: PORTABLE CHEST 1 VIEW COMPARISON:  07/13/2010 FINDINGS: Cardiac shadow is stable. Persistent paramediastinal/paraspinal mass lesion the right is noted. Right chest wall port is noted at the cavoatrial junction. Aortic calcifications are again seen and stable. Postsurgical changes in the left upper lobe are noted. Patchy airspace opacities are again identified throughout the left lung. No sizable effusion is noted. IMPRESSION: Stable right-sided mass consistent with the given clinical history of lung carcinoma. Persistent left lung opacities. Electronically Signed   By: Inez Catalina M.D.   On: 07/16/2019 08:44   DG Chest Port 1  View  Result Date: 07/14/2019 CLINICAL DATA:  Short of breath, nausea and vomiting, lung cancer EXAM: PORTABLE CHEST 1 VIEW COMPARISON:  06/23/2019, 06/25/2019 FINDINGS: Single frontal view of the chest demonstrates masslike consolidation right upper lobe consistent with known lung cancer. Cardiac silhouette is stable. Right chest wall port unchanged. Since the prior exam, there is progression of the left-sided ground-glass airspace disease. No evidence pneumothorax. No acute bony abnormality. IMPRESSION: 1. Developing left-sided airspace disease consistent with asymmetric edema or pneumonia. 2. Right upper lobe mass consistent with known lung cancer. Electronically Signed   By: Randa Ngo M.D.   On: 07/14/2019 22:30   DG CHEST PORT 1 VIEW  Result Date: 06/25/2019 CLINICAL DATA:  Shortness of breath. EXAM: PORTABLE CHEST 1 VIEW COMPARISON:  CT 06/23/2019.  Chest x-ray 06/23/2019.  FINDINGS: PowerPort catheter in stable position. Surgical sutures left chest. Heart size stable. Stable appearance of right paramediastinal mass. Patchy bilateral pulmonary infiltrates again noted, these are best identified by prior CT of 06/23/2019. Atelectatic changes left lung base. Stable right base pleural thickening. No pneumothorax. IMPRESSION: 1.  PowerPort catheter in stable position. 2.  Stable appearance of known right paramediastinal mass. 3. Patchy bilateral pulmonary infiltrates again noted, these are best identified by prior CT of 06/23/2019. Atelectatic changes left base. Stable right pleural thickening. Electronically Signed   By: Marcello Moores  Register   On: 06/25/2019 07:13   DG Chest Portable 1 View  Result Date: 06/23/2019 CLINICAL DATA:  Shortness of breath. EXAM: PORTABLE CHEST 1 VIEW COMPARISON:  April 14, 2019 FINDINGS: There is stable right-sided venous Port-A-Cath positioning. The lungs are hyperinflated. A stable paramediastinal opacity is again seen extending from the suprahilar region on the right. Very mild atelectasis and/or early infiltrate is seen within the left lung base. The heart size and mediastinal contours are within normal limits. Degenerative changes seen throughout the thoracic spine. IMPRESSION: 1. Very mild left basilar atelectasis and/or early infiltrate. 2. Stable findings consistent with the patient's known right-sided paramediastinal mass. Electronically Signed   By: Virgina Norfolk M.D.   On: 06/23/2019 20:27   ECHOCARDIOGRAM COMPLETE  Result Date: 06/24/2019   ECHOCARDIOGRAM REPORT   Patient Name:   ANYELA NAPIERKOWSKI Date of Exam: 06/24/2019 Medical Rec #:  [id number]        Height:       62.0 in Accession #:    [id number]       Weight:       101.9 lb Date of Birth:  10-05-1932         BSA:          1.44 m Patient Age:    40 years         BP:           110/68 mmHg Patient Gender: F                HR:           57 bpm. Exam Location:  Inpatient Procedure: 2D Echo  Indications:    Elevated Troponin  History:        Patient has prior history of Echocardiogram examinations, most                 recent 03/13/2019. History of covid 19.  Sonographer:    Vikki Ports Turrentine Referring Phys: [id number] JARED M GARDNER  Sonographer Comments: Technically difficult due to extremely small rib spacing. IMPRESSIONS  1. Left ventricular ejection fraction, by visual estimation, is 35%. The left  ventricle has moderately decreased function. There is no left ventricular hypertrophy.  2. Left ventricular diastolic parameters are consistent with Grade I diastolic dysfunction (impaired relaxation).  3. The left ventricle demonstrates regional wall motion abnormalities. Unusual pattern of wall motion abnormalities. The basal to mid inferoseptal, anteroseptal, inferior and inferolateral walls are akinetic. Severe hypokinesis of the basal anterior and  basal anterolateral walls. Preservation of apical segments. Possible "reverse Takotsubo" picture, but need to rule out coronary disease.  4. Global right ventricle has normal systolic function.The right ventricular size is normal. No increase in right ventricular wall thickness.  5. Left atrial size was normal.  6. Right atrial size was normal.  7. Mild mitral annular calcification.  8. The mitral valve is normal in structure. Mild mitral valve regurgitation. No evidence of mitral stenosis.  9. The tricuspid valve is normal in structure. Tricuspid valve regurgitation is trivial. 10. The aortic valve is tricuspid. Aortic valve regurgitation is not visualized. Mild aortic valve sclerosis without stenosis. 11. TR signal is inadequate for assessing pulmonary artery systolic pressure. 12. The inferior vena cava is normal in size with greater than 50% respiratory variability, suggesting right atrial pressure of 3 mmHg. FINDINGS  Left Ventricle: Left ventricular ejection fraction, by visual estimation, is 35%. The left ventricle has moderately decreased function. The  left ventricle demonstrates regional wall motion abnormalities. The left ventricular internal cavity size was the  left ventricle is normal in size. There is no left ventricular hypertrophy. Left ventricular diastolic parameters are consistent with Grade I diastolic dysfunction (impaired relaxation). Right Ventricle: The right ventricular size is normal. No increase in right ventricular wall thickness. Global RV systolic function is has normal systolic function. Left Atrium: Left atrial size was normal in size. Right Atrium: Right atrial size was normal in size Pericardium: There is no evidence of pericardial effusion. Mitral Valve: The mitral valve is normal in structure. Mild mitral annular calcification. Mild mitral valve regurgitation. No evidence of mitral valve stenosis by observation. Tricuspid Valve: The tricuspid valve is normal in structure. Tricuspid valve regurgitation is trivial. Aortic Valve: The aortic valve is tricuspid. Aortic valve regurgitation is not visualized. Mild aortic valve sclerosis is present, with no evidence of aortic valve stenosis. Pulmonic Valve: The pulmonic valve was normal in structure. Pulmonic valve regurgitation is not visualized. Pulmonic regurgitation is not visualized. Aorta: The aortic root is normal in size and structure. Venous: The inferior vena cava is normal in size with greater than 50% respiratory variability, suggesting right atrial pressure of 3 mmHg. IAS/Shunts: No atrial level shunt detected by color flow Doppler.  LEFT VENTRICLE PLAX 2D LVIDd:         4.20 cm  Diastology LVIDs:         3.40 cm  LV e' lateral:   3.70 cm/s LV PW:         0.70 cm  LV E/e' lateral: 13.3 LV IVS:        0.70 cm  LV e' medial:    3.48 cm/s LVOT diam:     1.60 cm  LV E/e' medial:  14.2 LV SV:         31 ml LV SV Index:   21.98 LVOT Area:     2.01 cm  RIGHT VENTRICLE TAPSE (M-mode): 2.1 cm LEFT ATRIUM             Index       RIGHT ATRIUM           Index LA  diam:        4.10 cm 2.86  cm/m  RA Area:     11.20 cm LA Vol (A2C):   35.7 ml 24.88 ml/m RA Volume:   25.80 ml  17.98 ml/m LA Vol (A4C):   32.8 ml 22.86 ml/m LA Biplane Vol: 35.1 ml 24.46 ml/m  AORTIC VALVE LVOT Vmax:   81.60 cm/s LVOT Vmean:  54.200 cm/s LVOT VTI:    0.171 m  AORTA Ao Root diam: 2.50 cm MITRAL VALVE MV Area (PHT): 3.37 cm              SHUNTS MV PHT:        65.25 msec            Systemic VTI:  0.17 m MV Decel Time: 225 msec              Systemic Diam: 1.60 cm MV E velocity: 49.30 cm/s  103 cm/s MV A velocity: 110.00 cm/s 70.3 cm/s MV E/A ratio:  0.45        1.5  Loralie Champagne MD Electronically signed by Loralie Champagne MD Signature Date/Time: 06/24/2019/6:38:18 PM    Final    XR HIP UNILAT W OR W/O PELVIS 2-3 VIEWS RIGHT  Result Date: 07/06/2019 Healed acetabular and pubic rami fractures.  There is abundant callus formation.  ECHOCARDIOGRAM LIMITED  Result Date: 07/16/2019    ECHOCARDIOGRAM LIMITED REPORT   Patient Name:   FARA WORTHY Date of Exam: 07/16/2019 Medical Rec #:  [id number]        Height:       62.0 in Accession #:    [id number]       Weight:       99.1 lb Date of Birth:  1933/04/23         BSA:          1.42 m Patient Age:    65 years         BP:           145/64 mmHg Patient Gender: F                HR:           71 bpm. Exam Location:  Inpatient Procedure: Limited Echo, Color Doppler and Cardiac Doppler Indications:    G62.69 Acute systolic (congestive) heart failure  History:        Patient has prior history of Echocardiogram examinations, most                 recent 06/24/2019. Takotsubo Cardiomyopathy, Bilateral Lung                 Cancer, COVID+ 06/02/19.  Sonographer:    Raquel Sarna Senior RDCS Referring Phys: [id number] [facility]  Sonographer Comments: Technically difficult due to small rib spaces. IMPRESSIONS  1. No definity used Endocardial border definition poor but EF appears significantly improved since echo done 06/24/19 . Left ventricular ejection fraction, by estimation, is 55 to 60%. The  left ventricle has normal function. The left ventricle has no regional wall motion abnormalities.  2. Right ventricular systolic function is normal. The right ventricular size is normal. There is normal pulmonary artery systolic pressure.  3. The mitral valve is normal in structure and function. Trivial mitral valve regurgitation. No evidence of mitral stenosis.  4. The aortic valve is tricuspid. Aortic valve regurgitation is not visualized. Mild to moderate aortic valve sclerosis/calcification is present, without any evidence of aortic stenosis. FINDINGS  Left Ventricle: No definity used Endocardial border definition poor but EF appears significantly improved since echo done 06/24/19. Left ventricular ejection fraction, by estimation, is 55 to 60%. The left ventricle has normal function. The left ventricle has no regional wall motion abnormalities. The left ventricular internal cavity size was normal in size. There is no left ventricular hypertrophy. Right Ventricle: The right ventricular size is normal. No increase in right ventricular wall thickness. Right ventricular systolic function is normal. There is normal pulmonary artery systolic pressure. The tricuspid regurgitant velocity is 2.02 m/s, and  with an assumed right atrial pressure of 8 mmHg, the estimated right ventricular systolic pressure is 21.3 mmHg. Left Atrium: Left atrial size was normal in size. Right Atrium: Right atrial size was normal in size. Pericardium: There is no evidence of pericardial effusion. Mitral Valve: The mitral valve is normal in structure and function. There is mild thickening of the mitral valve leaflet(s). Normal mobility of the mitral valve leaflets. Trivial mitral valve regurgitation. No evidence of mitral valve stenosis. Tricuspid Valve: The tricuspid valve is normal in structure. Tricuspid valve regurgitation is not demonstrated. No evidence of tricuspid stenosis. Aortic Valve: The aortic valve is tricuspid. Aortic valve  regurgitation is not visualized. Mild to moderate aortic valve sclerosis/calcification is present, without any evidence of aortic stenosis. Pulmonic Valve: The pulmonic valve was normal in structure. Pulmonic valve regurgitation is not visualized. No evidence of pulmonic stenosis. Aorta: The aortic root is normal in size and structure. IAS/Shunts: No atrial level shunt detected by color flow Doppler.  LEFT VENTRICLE PLAX 2D LVIDd:         2.90 cm LVIDs:         1.50 cm LV PW:         1.10 cm LV IVS:        1.00 cm LVOT diam:     1.60 cm LV SV Index:   18.74 LVOT Area:     2.01 cm  RIGHT VENTRICLE RV S prime:     14.10 cm/s TAPSE (M-mode): 2.0 cm LEFT ATRIUM         Index LA diam:    3.40 cm 2.40 cm/m   AORTA Ao Root diam: 2.30 cm TRICUSPID VALVE TR Peak grad:   16.3 mmHg TR Vmax:        202.00 cm/s  SHUNTS Systemic Diam: 1.60 cm Jenkins Rouge MD Electronically signed by Jenkins Rouge MD Signature Date/Time: 07/16/2019/11:57:11 AM    Final     Microbiology: Recent Results (from the past 240 hour(s))  SARS CORONAVIRUS 2 (TAT 6-24 HRS) Nasopharyngeal Nasopharyngeal Swab     Status: None   Collection Time: 07/15/19  2:22 AM   Specimen: Nasopharyngeal Swab  Result Value Ref Range Status   SARS Coronavirus 2 NEGATIVE NEGATIVE Final    Comment: (NOTE) SARS-CoV-2 target nucleic acids are NOT DETECTED. The SARS-CoV-2 RNA is generally detectable in upper and lower respiratory specimens during the acute phase of infection. Negative results do not preclude SARS-CoV-2 infection, do not rule out co-infections with other pathogens, and should not be used as the sole basis for treatment or other patient management decisions. Negative results must be combined with clinical observations, patient history, and epidemiological information. The expected result is Negative. Fact Sheet for Patients: SugarRoll.be Fact Sheet for Healthcare  Providers: https://www.woods-mathews.com/ This test is not yet approved or cleared by the [place] FDA and  has been authorized for detection and/or diagnosis of SARS-CoV-2 by FDA under an Emergency Use  Authorization (EUA). This EUA will remain  in effect (meaning this test can be used) for the duration of the COVID-19 declaration under Section 56 4(b)(1) of the Act, 21 U.S.C. section 360bbb-3(b)(1), unless the authorization is terminated or revoked sooner. Performed at [facility], [place] [address] [place], [place] [address]   Respiratory Panel by RT PCR (Flu A&B, Covid) - Nasopharyngeal Swab     Status: None   Collection Time: 07/21/19 10:37 AM   Specimen: Nasopharyngeal Swab  Result Value Ref Range Status   SARS Coronavirus 2 by RT PCR NEGATIVE NEGATIVE Final    Comment: (NOTE) SARS-CoV-2 target nucleic acids are NOT DETECTED. The SARS-CoV-2 RNA is generally detectable in upper respiratoy specimens during the acute phase of infection. The lowest concentration of SARS-CoV-2 viral copies this assay can detect is 131 copies/mL. A negative result does not preclude SARS-Cov-2 infection and should not be used as the sole basis for treatment or other patient management decisions. A negative result may occur with  improper specimen collection/handling, submission of specimen other than nasopharyngeal swab, presence of viral mutation(s) within the areas targeted by this assay, and inadequate number of viral copies (<131 copies/mL). A negative result must be combined with clinical observations, patient history, and epidemiological information. The expected result is Negative. Fact Sheet for Patients:  PinkCheek.be Fact Sheet for Healthcare Providers:  GravelBags.it This test is not yet ap proved or cleared by the [place] FDA and  has been authorized for detection and/or diagnosis of SARS-CoV-2 by FDA  under an Emergency Use Authorization (EUA). This EUA will remain  in effect (meaning this test can be used) for the duration of the COVID-19 declaration under Section 564(b)(1) of the Act, 21 U.S.C. section 360bbb-3(b)(1), unless the authorization is terminated or revoked sooner.    Influenza A by PCR NEGATIVE NEGATIVE Final   Influenza B by PCR NEGATIVE NEGATIVE Final    Comment: (NOTE) The Xpert Xpress SARS-CoV-2/FLU/RSV assay is intended as an aid in  the diagnosis of influenza from Nasopharyngeal swab specimens and  should not be used as a sole basis for treatment. Nasal washings and  aspirates are unacceptable for Xpert Xpress SARS-CoV-2/FLU/RSV  testing. Fact Sheet for Patients: PinkCheek.be Fact Sheet for Healthcare Providers: GravelBags.it This test is not yet approved or cleared by the [place] FDA and  has been authorized for detection and/or diagnosis of SARS-CoV-2 by  FDA under an Emergency Use Authorization (EUA). This EUA will remain  in effect (meaning this test can be used) for the duration of the  Covid-19 declaration under Section 564(b)(1) of the Act, 21  U.S.C. section 360bbb-3(b)(1), unless the authorization is  terminated or revoked. Performed at [facility], [place] [address], [place], [place] [address]      Labs: Basic Metabolic Panel: Recent Labs  Lab 07/17/19 [phone] 07/18/19 0707 07/19/19 0851 07/21/19 0820  NA 128* 128* 127* 131*  K 4.1 3.5 3.4* 4.0  CL 92* 92* 87* 89*  CO2 28 28 28 29   GLUCOSE 123* 96 174* 144*  BUN 16 20 23 19   CREATININE 0.76 0.70 0.75 0.54  CALCIUM 8.4* 8.5* 8.4* 8.3*  MG 2.2 1.9 1.9  --   PHOS 4.1 2.9  --   --    Liver Function Tests: Recent Labs  Lab 07/17/19 0619  AST 37  ALT 28  ALKPHOS 118  BILITOT 0.9  PROT 6.7  ALBUMIN 2.3*   No results for input(s): LIPASE, AMYLASE in the  last 168 hours. No results for input(s): AMMONIA in the last 168  hours. CBC: Recent Labs  Lab 07/17/19 0619 07/21/19 0820  WBC 7.4 9.7  NEUTROABS 5.8 8.5*  HGB 12.7 12.8  HCT 37.9 39.2  MCV 96.7 99.2  PLT 380 398   Cardiac Enzymes: No results for input(s): CKTOTAL, CKMB, CKMBINDEX, TROPONINI in the last 168 hours. BNP: BNP (last 3 results) Recent Labs    07/01/19 1635 07/14/19 2206 07/21/19 0820  BNP 147.1* 452.2* 1,284.7*    ProBNP (last 3 results) No results for input(s): PROBNP in the last 8760 hours.  CBG: No results for input(s): GLUCAP in the last 168 hours.     Signed:  Alma Friendly, MD [facility] 07/23/2019, 1:45 PM

## 2019-07-23 NOTE — Progress Notes (Signed)
[facility] Documentation  DME has been delivered to pt's home and pt has an admission visit into hospice care at 3:30pm today. Pt's daughter stated that pt feels fine riding home in car.   Please dc pt with DNR paperwork and any new rxs.   Thank you for the referral.  Freddie Breech, RN [facility] Lia[facility] [phone]

## 2019-07-23 NOTE — Progress Notes (Signed)
Patient discharged to home with hospice care via [facility] transporter, daughter was at bedside and patient was medicated for comfort at time of transfer.

## 2019-07-28 ENCOUNTER — Encounter: Payer: Medicare PPO | Admitting: Physician Assistant

## 2019-07-28 NOTE — Progress Notes (Signed)
This encounter was created in error - please disregard.

## 2019-08-02 DEATH — deceased

## 2019-08-26 ENCOUNTER — Other Ambulatory Visit: Payer: Medicare Other

## 2019-09-08 ENCOUNTER — Other Ambulatory Visit (HOSPITAL_COMMUNITY): Payer: Medicare PPO

## 2019-09-15 ENCOUNTER — Ambulatory Visit: Payer: Medicare PPO | Admitting: Cardiovascular Disease

## 2019-09-23 ENCOUNTER — Other Ambulatory Visit (HOSPITAL_COMMUNITY): Payer: Medicare PPO
